# Patient Record
Sex: Female | Born: 1950 | Race: White | Hispanic: No | Marital: Married | Smoking: Never smoker
Health system: Southern US, Community
[De-identification: ages and names within clinical notes are randomized; demographics above are authoritative.]

## PROBLEM LIST (undated history)

## (undated) DIAGNOSIS — K838 Other specified diseases of biliary tract: Secondary | ICD-10-CM

## (undated) DIAGNOSIS — T7840XA Allergy, unspecified, initial encounter: Secondary | ICD-10-CM

## (undated) DIAGNOSIS — H269 Unspecified cataract: Secondary | ICD-10-CM

## (undated) DIAGNOSIS — Z974 Presence of external hearing-aid: Secondary | ICD-10-CM

## (undated) DIAGNOSIS — I10 Essential (primary) hypertension: Secondary | ICD-10-CM

## (undated) DIAGNOSIS — K219 Gastro-esophageal reflux disease without esophagitis: Secondary | ICD-10-CM

## (undated) DIAGNOSIS — E785 Hyperlipidemia, unspecified: Secondary | ICD-10-CM

## (undated) DIAGNOSIS — F419 Anxiety disorder, unspecified: Secondary | ICD-10-CM

## (undated) DIAGNOSIS — F32A Depression, unspecified: Secondary | ICD-10-CM

## (undated) DIAGNOSIS — M1990 Unspecified osteoarthritis, unspecified site: Secondary | ICD-10-CM

## (undated) DIAGNOSIS — N189 Chronic kidney disease, unspecified: Secondary | ICD-10-CM

## (undated) DIAGNOSIS — K9189 Other postprocedural complications and disorders of digestive system: Secondary | ICD-10-CM

## (undated) DIAGNOSIS — G4730 Sleep apnea, unspecified: Secondary | ICD-10-CM

## (undated) DIAGNOSIS — C801 Malignant (primary) neoplasm, unspecified: Secondary | ICD-10-CM

## (undated) DIAGNOSIS — F329 Major depressive disorder, single episode, unspecified: Secondary | ICD-10-CM

## (undated) DIAGNOSIS — D333 Benign neoplasm of cranial nerves: Secondary | ICD-10-CM

## (undated) HISTORY — PX: FOOT SURGERY: SHX648

## (undated) HISTORY — DX: Essential (primary) hypertension: I10

## (undated) HISTORY — DX: Hyperlipidemia, unspecified: E78.5

## (undated) HISTORY — PX: TUBAL LIGATION: SHX77

## (undated) HISTORY — DX: Depression, unspecified: F32.A

## (undated) HISTORY — DX: Other specified diseases of biliary tract: K91.89

## (undated) HISTORY — DX: Allergy, unspecified, initial encounter: T78.40XA

## (undated) HISTORY — PX: COLONOSCOPY: SHX174

## (undated) HISTORY — DX: Unspecified cataract: H26.9

## (undated) HISTORY — DX: Other specified diseases of biliary tract: K83.8

## (undated) HISTORY — PX: BACK SURGERY: SHX140

## (undated) HISTORY — PX: ANKLE FRACTURE SURGERY: SHX122

## (undated) HISTORY — DX: Major depressive disorder, single episode, unspecified: F32.9

## (undated) HISTORY — DX: Malignant (primary) neoplasm, unspecified: C80.1

## (undated) HISTORY — PX: OTHER SURGICAL HISTORY: SHX169

---

## 1968-05-31 HISTORY — PX: BREAST CYST EXCISION: SHX579

## 1998-05-31 HISTORY — PX: MELANOMA EXCISION: SHX5266

## 2004-07-02 ENCOUNTER — Ambulatory Visit: Payer: Self-pay | Admitting: Internal Medicine

## 2004-11-04 ENCOUNTER — Ambulatory Visit: Payer: Self-pay | Admitting: Internal Medicine

## 2004-12-15 ENCOUNTER — Encounter: Payer: Self-pay | Admitting: Otolaryngology

## 2005-09-21 ENCOUNTER — Ambulatory Visit: Payer: Self-pay | Admitting: Internal Medicine

## 2006-06-22 ENCOUNTER — Ambulatory Visit: Payer: Self-pay | Admitting: Gastroenterology

## 2006-09-26 ENCOUNTER — Ambulatory Visit: Payer: Self-pay | Admitting: Internal Medicine

## 2007-01-02 ENCOUNTER — Ambulatory Visit: Payer: Self-pay | Admitting: Internal Medicine

## 2007-06-09 ENCOUNTER — Ambulatory Visit: Payer: Self-pay | Admitting: Internal Medicine

## 2007-08-23 ENCOUNTER — Inpatient Hospital Stay: Payer: Self-pay | Admitting: Vascular Surgery

## 2008-05-31 HISTORY — PX: APPENDECTOMY: SHX54

## 2008-10-15 ENCOUNTER — Ambulatory Visit: Payer: Self-pay | Admitting: Internal Medicine

## 2008-11-26 ENCOUNTER — Ambulatory Visit: Payer: Self-pay | Admitting: Internal Medicine

## 2009-05-19 ENCOUNTER — Ambulatory Visit: Payer: Self-pay | Admitting: Internal Medicine

## 2009-08-12 ENCOUNTER — Encounter: Payer: Self-pay | Admitting: Internal Medicine

## 2009-08-21 ENCOUNTER — Ambulatory Visit: Payer: Self-pay | Admitting: Internal Medicine

## 2009-08-29 ENCOUNTER — Encounter: Payer: Self-pay | Admitting: Internal Medicine

## 2009-09-28 ENCOUNTER — Encounter: Payer: Self-pay | Admitting: Internal Medicine

## 2009-10-29 ENCOUNTER — Ambulatory Visit (HOSPITAL_COMMUNITY): Admission: RE | Admit: 2009-10-29 | Discharge: 2009-10-30 | Payer: Self-pay | Admitting: Neurosurgery

## 2010-02-24 ENCOUNTER — Encounter: Payer: Self-pay | Admitting: Internal Medicine

## 2010-03-30 ENCOUNTER — Ambulatory Visit: Payer: Self-pay | Admitting: Internal Medicine

## 2010-04-10 ENCOUNTER — Ambulatory Visit: Payer: Self-pay | Admitting: Internal Medicine

## 2010-04-10 DIAGNOSIS — R05 Cough: Secondary | ICD-10-CM | POA: Insufficient documentation

## 2010-04-10 DIAGNOSIS — I10 Essential (primary) hypertension: Secondary | ICD-10-CM | POA: Insufficient documentation

## 2010-04-10 DIAGNOSIS — E785 Hyperlipidemia, unspecified: Secondary | ICD-10-CM | POA: Insufficient documentation

## 2010-04-10 DIAGNOSIS — M8580 Other specified disorders of bone density and structure, unspecified site: Secondary | ICD-10-CM | POA: Insufficient documentation

## 2010-04-10 DIAGNOSIS — M810 Age-related osteoporosis without current pathological fracture: Secondary | ICD-10-CM | POA: Insufficient documentation

## 2010-04-10 DIAGNOSIS — R059 Cough, unspecified: Secondary | ICD-10-CM | POA: Insufficient documentation

## 2010-04-10 HISTORY — DX: Essential (primary) hypertension: I10

## 2010-04-10 HISTORY — DX: Age-related osteoporosis without current pathological fracture: M81.0

## 2010-04-16 ENCOUNTER — Ambulatory Visit: Payer: Self-pay | Admitting: Internal Medicine

## 2010-05-06 ENCOUNTER — Encounter: Payer: Self-pay | Admitting: Internal Medicine

## 2010-05-13 ENCOUNTER — Ambulatory Visit: Payer: Self-pay | Admitting: Internal Medicine

## 2010-05-15 ENCOUNTER — Telehealth (INDEPENDENT_AMBULATORY_CARE_PROVIDER_SITE_OTHER): Payer: Self-pay | Admitting: *Deleted

## 2010-05-26 ENCOUNTER — Ambulatory Visit (HOSPITAL_COMMUNITY)
Admission: RE | Admit: 2010-05-26 | Discharge: 2010-05-26 | Payer: Self-pay | Source: Home / Self Care | Attending: Internal Medicine | Admitting: Internal Medicine

## 2010-06-02 ENCOUNTER — Encounter: Payer: Self-pay | Admitting: Unknown Physician Specialty

## 2010-06-16 ENCOUNTER — Telehealth: Payer: Self-pay | Admitting: Internal Medicine

## 2010-06-30 NOTE — Letter (Signed)
Summary: [facility] Practices   Imported By: Sherian Rein 04/15/2010 14:02:31  _____________________________________________________________________  External Attachment:    Type:   Image     Comment:   External Document

## 2010-06-30 NOTE — Assessment & Plan Note (Signed)
Summary: cough, unresponsive to PPIs///JJ   Visit Type:  Initial Consult Copy to:  Dr Darrick Huntsman  Primary Provider/Referring Provider:  Dr. Duncan Dull, [facility]  CC:  Pulmonary consult for chronic dry cough and phlegm in throat. Marland Kitchen  History of Present Illness: April 10, 2010: REferred by Dr. Darrick Huntsman. 59 year pleasant lady. Limited remote smoker.  STates for 3 years has had persistent, chronic post nasal drainage that results in cough periodically. Cough is intermittent and only few times a week. Occassionally episodes are "violent" and in fall/winter sinus drainage tends to be thick. She feels cough episodes are a by product of the post nasal drainage. She feels there is phlegm in throat al lthe time. Cough is worse in fall and winter and will have green sputum bronchiis few times. Cough improved by water. Cough meds have not helped. 6  week nasal inhalers did not help earlier this year.  Symptoms are associated with sensation of constantly hvaing to clear throat. Denies wheeze, edema, hemoptysis, fever, weight loss, chills.     Seen by a an ENT 3 years ago Thought it was due to lopressor. 30 day trial off the drug did not resolve cough and so back on lopressor. Was advised zyrtec and claritin but without relief. A year ago PMD then considered GERD  and started omeoprazole which she has been taking for a year without relief. Summer 2011 saw Dr. Willeen Cass another ENT specialist. Had RAST alergy test that was normal. CT scan sinuses - normal per history. REviewed with PMD again and referred to pulmonary.    Preventive Screening-Counseling & Management  Alcohol-Tobacco     Smoking Status: quit > 6 months     Packs/Day: <0.25     Year Started: 1969     Year Quit: 1975  Current Medications (verified): 1)  Omeprazole 40 Mg Cpdr (Omeprazole) .... Take 1 Tablet By Mouth Once A Day 2)  Pravastatin Sodium 20 Mg Tabs (Pravastatin Sodium) .... Take 1 Tablet By Mouth Once A Day 3)   Meloxicam 15 Mg Tabs (Meloxicam) .... Take 1 Tablet By Mouth Once A Day 4)  Triamterene-Hctz 37.5-25 Mg Tabs (Triamterene-Hctz) .... Take 1 Tablet By Mouth Once A Day 5)  Celexa 20 Mg Tabs (Citalopram Hydrobromide) .... Take 1 Tablet By Mouth Once A Day 6)  Alprazolam 0.25 Mg Tabs (Alprazolam) .... Take 1/2 To 1 Daily As Needed 7)  Metoprolol Succinate 50 Mg Xr24h-Tab (Metoprolol Succinate) .... Take 1 Tablet By Mouth Once A Day 8)  Nizoral 2 % Sham (Ketoconazole) .... Shampoo As Needed 9)  Caltrate 600+d Plus 600-400 Mg-Unit Tabs (Calcium Carbonate-Vit D-Min) .... Take 1 Tablet By Mouth Once A Day 10)  Multivitamins  Tabs (Multiple Vitamin) .... Take 1 Tablet By Mouth Once A Day 11)  Omega-3 1000 Mg Caps (Omega-3 Fatty Acids) .... 2 Tablets Daily 12)  Vitamin D3 1000 Unit Tabs (Cholecalciferol) .... Take 1 Tablet By Mouth Once A Day 13)  Sudafed 30 Mg Tabs (Pseudoephedrine Hcl) .... As Needed 14)  Tylenol Extra Strength 500 Mg Tabs (Acetaminophen) .... As Needed 15)  Fibercon 625 Mg Tabs (Calcium Polycarbophil) .... As Needed 16)  Hydrocodone-Acetaminophen 7.5-750 Mg Tabs (Hydrocodone-Acetaminophen) .... As Needed  Allergies (verified): No Known Drug Allergies  Past History:  Past Medical History: Hypertension Hypercholesterolemia Melanoma Osteoporosis  Procedures:  Carotid Doppler 6-06-negative Negative Treadmill with Myoview May 2003 Cardiovascular Stress Test-2003, normal per pt Echo Trace Tricuspid Regur 4-03 Hx of renal insuff  - Creat 1.04  per patient. 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   Occ glass  of wine Smoking Status:  quit > 6 months Packs/Day:  <0.25  Review of Systems       The patient complains of non-productive cough and joint stiffness or pain.  The patient denies shortness of breath with activity, shortness of breath at rest, productive cough, coughing up blood, chest pain, irregular heartbeats, acid heartburn, indigestion, loss of appetite, weight change, abdominal pain, difficulty swallowing, sore throat, tooth/dental problems, headaches, nasal congestion/difficulty breathing through nose, sneezing, itching, ear ache, anxiety, depression, hand/feet swelling, rash, change in color of mucus, and fever.    Vital Signs:  Patient profile:   60 year old female Height:      62 inches Weight:      156.13 pounds BMI:     28.66 O2 Sat:      98 % on Room air Temp:     97.8 degrees F oral Pulse rate:   52 / minute BP sitting:   130 / 88  (right arm) Cuff size:   regular  Vitals Entered By: Carron Curie CMA (April 10, 2010 1:35 PM)  O2 Flow:  Room air CC: Pulmonary consult for chronic dry cough and phlegm in throat.  Comments Medications reviewed with patient Carron Curie CMA  April 10, 2010 1:36 PM Daytime phone number verified with patient.    Physical Exam  General:  well developed, well nourished, in no acute distress Head:  normocephalic and atraumatic Eyes:  PERRLA/EOM intact; conjunctiva and sclera clear Ears:  TMs intact and clear with normal canals Nose:  no deformity, discharge, inflammation, or lesions Mouth:  no deformity or lesions Neck:  no masses, thyromegaly, or abnormal cervical nodes Chest Wall:  no deformities noted Lungs:  clear bilaterally to auscultation and percussion Heart:  regular rate and rhythm, S1, S2 without murmurs, rubs, gallops, or clicks Abdomen:  bowel sounds positive; abdomen soft and non-tender without masses, or organomegaly Msk:  no deformity or scoliosis noted with normal posture Pulses:  pulses  normal Extremities:  no clubbing, cyanosis, edema, or deformity noted Neurologic:  CN II-XII grossly intact with normal reflexes, coordination, muscle strength and tone Skin:  intact without lesions or rashes Cervical Nodes:  no significant adenopathy Axillary Nodes:  no significant adenopathy Psych:  alert and cooperative; normal mood and affect; normal attention span and concentration   Impression & Recommendations:  Problem # 1:  COUGH (ICD-786.2) Assessment New  Probably from chronic sinus drainage and +/- habit cough  plan start netti pot saline wash daily full pft - > depending ion results CT or methacholine challenge if they are negative, try speech therapy and neurontin for cyclical cough  Orders: Consultation Level IV (16109)  Medications Added to Medication List This Visit: 1)  Sudafed 30 Mg Tabs (Pseudoephedrine hcl) .... As needed 2)  Tylenol Extra Strength 500 Mg Tabs (Acetaminophen) .... As needed 3)  Fibercon 625 Mg Tabs (Calcium polycarbophil) .... As needed 4)  Hydrocodone-acetaminophen 7.5-750 Mg Tabs (Hydrocodone-acetaminophen) .... As needed  Patient Instructions: 1)  do netti pot saline wash daily 2)  have full  PFT here or [place] 3)   - call us once done so I can review and advise next step or medicine over phone 4)  have flu shot today

## 2010-06-30 NOTE — Miscellaneous (Signed)
Summary: Orders Update pft charges  Clinical Lists Changes  Orders: Added new Service order of Carbon Monoxide diffusing w/capacity (94720) - Signed Added new Service order of Lung Volumes (94240) - Signed Added new Service order of Spirometry (Pre & Post) (94060) - Signed 

## 2010-07-01 ENCOUNTER — Encounter: Payer: Self-pay | Admitting: Unknown Physician Specialty

## 2010-07-02 NOTE — Progress Notes (Signed)
Summary: reqeusting results today  Phone Note Call from Patient Call back at Home Phone [phone]   Caller: Patient Call For: DR. Marchelle Gearing Summary of Call: Patient phoned she has an appointment with her PCP Dr. Darrick Huntsman on Tuesday 06/19/09 and would like the results of her breathing test sent to Dr. Darrick Huntsman so they could discuss the results at this appointment. Patient would also like the results id she can be called today at [phone] becasue patient will be in [place] on Monday. If she can't get the results today just please send them to Dr. Darrick Huntsman.  Patient can be reached at [phone] Initial call taken by: Vedia Coffer,  May 15, 2010 5:06 PM  Follow-up for Phone Call        Called, spoke with pt.  She is requesting PFT results from 04/16/10.  These results are in EMR.  Also requesting results be sent to Dr. Darrick Huntsman, her PCP.  MR, pls advise of results.  Thanks!  ** PFT results faxed to Dr. Darrick Huntsman Follow-up by: Gweneth Dimitri RN,  May 15, 2010 5:14 PM  Additional Follow-up for Phone Call Additional follow up Details #1::        Gave results of PFT to patient. IT is normal. Therefore, no COPD but asthma not ruled out. I have ordered methacholine challenge. Please schedule this (order done) and rov after methacholine challenge Additional Follow-up by: Kalman Shan MD,  May 15, 2010 5:25 PM    Additional Follow-up for Phone Call Additional follow up Details #2::    Will forward message to [facility] to schedule methacholine challenge.  ROV with MR needed after test. Gweneth Dimitri RN  May 15, 2010 5:27 PM methacholine challege test has been scheduled; left msg for pt to call to give info .Kandice Hams [facility]  May 18, 2010 9:57 AM  Follow-up by: Kandice Hams CMA,  May 18, 2010 9:57 AM  Additional Follow-up for Phone Call Additional follow up Details #3:: Details for Additional Follow-up Action Taken: pt called back and is aware of this appt for methacholine  challenge @[facility]  05/26/10@10 :00am  Additional Follow-up by: Oneita Jolly,  May 19, 2010 11:13 AM

## 2010-07-02 NOTE — Progress Notes (Signed)
Summary: test results and where to go from here  Phone Note Call from Patient   Caller: Patient Call For: DR [facility] Summary of Call: Patient phoned stated that she had the challange test in December after Christmas and she has not heard from Dr. Marchelle Gearing on these resutls. Also stated that Dr. Marchelle Gearing told her that they needed to rule out Asthma first and once they ruled this out there was a medicine that they could try. She also went to a ENT Dr. 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 He also told her to take zyrtec. She is using the nose drops and zyrtec and still has the mucas. Dr. Jenne [facility] also doubled her reflux meds.Patient can be reached at [phone] she has speech therapy from 3:30 to 4:30. Patient would like a call back to see where she goes from here.    Initial call taken by: Vedia Coffer,  June 16, 2010 2:36 PM  Follow-up for Phone Call        called and spoke with pt.  pt states she had [facility] test done 05/26/2010 and is waiting on the results of this.    Also, pt states her daughter is a speech therapist and recommended pt see an ENT.  Therefore, pt went to see Dr. 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  pt c/o having a lot of mucus in her throat and having to clear her throat alot which has caused hoarseness.  Pt states she is following Dr. Loraine Grip' recs:  Taking Omeprazole 40mg  two times a day, Zyrtec once daily, and Ipratropium nose drops 2 sprays two times a day and has gone to speech therapy 4 x.   Pt states MR had mentioned putting pt on a med to help with the mucus and pt would like to discuss this.  will forward message to MR to address. Aundra Millet Reynolds LPN  June 16, 2010 3:09 PM   Additional Follow-up for Phone Call Additional follow up Details #1::        Methacholine challenge test neative 05/06/2010. Please note that they had not sent [place] the report  and we had to request it be faxed.  REC 1. Is she doing netti pot ? 2. My plan was if methacholine negativge was to have her do neurontin and speech therapy. It looks like she is already doing speech therapy. Has she started neurontin ?  I called her at home and LMTCB. Called her at other number and no return. Please offer her retrun to see me to discuss in detail face to face. If she wont take it then we can do neurontin script  Just let  me know Additional Follow-up by: Kalman Shan MD,  June 16, 2010 6:46 PM    Additional Follow-up for Phone Call Additional follow up Details #2::    Called pt's home number at [phone]  [facility]  MR has openings this am -- ? can she come in today to discuss this? Gweneth Dimitri RN  June 17, 2010 9:44 AM   Returning call. [phone] [facility]  June 17, 2010 3:40 PM  Additional Follow-up for Phone Call Additional follow up Details #3:: Details for Additional Follow-up Action Taken: I called number above. LMTCB. Please schedule appt for her for face to face discussion Additional Follow-up by: Kalman Shan MD,  June 17, 2010 6:03 PM  Spoke  with pt and advised MR recs and appt to discuss. She states she wants to try a couple more weeks of speech therapy and then call back and set an appt if needed. Carron Curie CMA  June 18, 2010 10:26 AM

## 2010-08-17 LAB — BASIC METABOLIC PANEL
BUN: 41 mg/dL — ABNORMAL HIGH (ref 6–23)
CO2: 27 mEq/L (ref 19–32)
Calcium: 9.3 mg/dL (ref 8.4–10.5)
Chloride: 112 mEq/L (ref 96–112)
Creatinine, Ser: 1.5 mg/dL — ABNORMAL HIGH (ref 0.4–1.2)
GFR calc Af Amer: 43 mL/min — ABNORMAL LOW (ref >60)
GFR calc non Af Amer: 35 mL/min — ABNORMAL LOW (ref >60)
Glucose, Bld: 90 mg/dL (ref 70–99)
Potassium: 5.2 mEq/L — ABNORMAL HIGH (ref 3.5–5.1)
Sodium: 144 mEq/L (ref 135–145)

## 2010-08-17 LAB — URINALYSIS, ROUTINE W REFLEX MICROSCOPIC
Glucose, UA: NEGATIVE mg/dL
Hgb urine dipstick: NEGATIVE
Ketones, ur: NEGATIVE mg/dL
Nitrite: NEGATIVE
Protein, ur: NEGATIVE mg/dL
Specific Gravity, Urine: 1.029 (ref 1.005–1.030)
Urobilinogen, UA: 0.2 mg/dL (ref 0.0–1.0)
pH: 5.5 (ref 5.0–8.0)

## 2010-08-17 LAB — APTT: aPTT: 27 seconds (ref 24–37)

## 2010-08-17 LAB — CBC
HCT: 35.5 % — ABNORMAL LOW (ref 36.0–46.0)
Hemoglobin: 12.5 g/dL (ref 12.0–15.0)
MCHC: 35.3 g/dL (ref 30.0–36.0)
MCV: 96.2 fL (ref 78.0–100.0)
Platelets: 211 10*3/uL (ref 150–400)
RBC: 3.69 MIL/uL — ABNORMAL LOW (ref 3.87–5.11)
RDW: 13.4 % (ref 11.5–15.5)
WBC: 9.3 10*3/uL (ref 4.0–10.5)

## 2010-08-17 LAB — SURGICAL PCR SCREEN
MRSA, PCR: NEGATIVE
Staphylococcus aureus: NEGATIVE

## 2010-08-17 LAB — PROTIME-INR
INR: 0.91 (ref 0.00–1.49)
Prothrombin Time: 12.2 seconds (ref 11.6–15.2)

## 2010-11-02 DIAGNOSIS — Z85820 Personal history of malignant melanoma of skin: Secondary | ICD-10-CM | POA: Insufficient documentation

## 2010-11-02 DIAGNOSIS — C439 Malignant melanoma of skin, unspecified: Secondary | ICD-10-CM | POA: Insufficient documentation

## 2010-11-02 HISTORY — DX: Malignant melanoma of skin, unspecified: C43.9

## 2011-01-22 ENCOUNTER — Other Ambulatory Visit: Payer: Self-pay | Admitting: *Deleted

## 2011-01-22 MED ORDER — OMEPRAZOLE 40 MG PO CPDR: 40.0000 mg | DELAYED_RELEASE_CAPSULE | Freq: Two times a day (BID) | ORAL | Status: DC

## 2011-01-27 ENCOUNTER — Other Ambulatory Visit: Payer: Self-pay | Admitting: Internal Medicine

## 2011-01-27 MED ORDER — ATORVASTATIN CALCIUM 20 MG PO TABS: 20.0000 mg | ORAL_TABLET | Freq: Every day | ORAL | Status: DC

## 2011-04-05 DIAGNOSIS — D4819 Other specified neoplasm of uncertain behavior of connective and other soft tissue: Secondary | ICD-10-CM

## 2011-04-05 DIAGNOSIS — D481 Neoplasm of uncertain behavior of connective and other soft tissue: Secondary | ICD-10-CM | POA: Insufficient documentation

## 2011-04-05 HISTORY — DX: Other specified neoplasm of uncertain behavior of connective and other soft tissue: D48.19

## 2011-04-29 ENCOUNTER — Ambulatory Visit: Payer: Self-pay | Admitting: Internal Medicine

## 2011-06-13 ENCOUNTER — Other Ambulatory Visit: Payer: Self-pay | Admitting: Internal Medicine

## 2011-07-28 ENCOUNTER — Other Ambulatory Visit: Payer: Self-pay | Admitting: Internal Medicine

## 2011-07-28 MED ORDER — OMEPRAZOLE 40 MG PO CPDR: 40.0000 mg | DELAYED_RELEASE_CAPSULE | Freq: Two times a day (BID) | ORAL | Status: DC

## 2011-08-25 ENCOUNTER — Ambulatory Visit: Payer: Self-pay | Admitting: Unknown Physician Specialty

## 2011-08-30 ENCOUNTER — Other Ambulatory Visit: Payer: Self-pay | Admitting: Internal Medicine

## 2011-08-30 MED ORDER — ATORVASTATIN CALCIUM 20 MG PO TABS: 20.0000 mg | ORAL_TABLET | Freq: Every day | ORAL | Status: DC

## 2011-08-30 NOTE — Telephone Encounter (Signed)
This patient told me she would be staying with Dr. Alison Murray when I left the practice,  And I have not seen her yet in nearly 8 months, so I cannot keep refilling if she is not going to follow up with me. If she plans to continue with ,e ,  Ok to refill #30 each with 1 refill

## 2011-09-01 NOTE — Telephone Encounter (Signed)
I called patient and she stated she was staying with Dr. Alison Murray, so I did not call in her medication request.  I denied them so the pharmacy would not send anymore.

## 2011-09-01 NOTE — Telephone Encounter (Signed)
I did no authorize refill on the atorvastatin since she has not had labs in over 8 months.  Please cancel authorization if she has not picked up

## 2011-09-30 DIAGNOSIS — D333 Benign neoplasm of cranial nerves: Secondary | ICD-10-CM | POA: Insufficient documentation

## 2011-09-30 HISTORY — DX: Benign neoplasm of cranial nerves: D33.3

## 2011-10-21 DIAGNOSIS — C44320 Squamous cell carcinoma of skin of unspecified parts of face: Secondary | ICD-10-CM

## 2011-10-21 HISTORY — DX: Squamous cell carcinoma of skin of unspecified parts of face: C44.320

## 2011-11-26 ENCOUNTER — Other Ambulatory Visit: Payer: Self-pay | Admitting: Internal Medicine

## 2011-11-30 ENCOUNTER — Other Ambulatory Visit: Payer: Self-pay | Admitting: *Deleted

## 2011-11-30 MED ORDER — OMEPRAZOLE 40 MG PO CPDR: 40.0000 mg | DELAYED_RELEASE_CAPSULE | Freq: Two times a day (BID) | ORAL | Status: DC

## 2011-12-13 ENCOUNTER — Ambulatory Visit: Payer: Self-pay | Admitting: Internal Medicine

## 2012-02-27 ENCOUNTER — Other Ambulatory Visit: Payer: Self-pay | Admitting: Internal Medicine

## 2012-02-28 ENCOUNTER — Other Ambulatory Visit: Payer: Self-pay | Admitting: Internal Medicine

## 2012-02-28 NOTE — Telephone Encounter (Signed)
Patient has not been seen in this office.  Cannot refill

## 2012-06-06 ENCOUNTER — Other Ambulatory Visit: Payer: Self-pay | Admitting: Internal Medicine

## 2012-06-06 NOTE — Telephone Encounter (Signed)
Prior Authorization is needed for this medication # [id number]  omeprazole (PRILOSEC) 40 MG capsule   #60

## 2012-06-09 NOTE — Telephone Encounter (Signed)
Insurance company called waiting on fax.

## 2012-06-12 ENCOUNTER — Telehealth: Payer: Self-pay | Admitting: *Deleted

## 2012-06-12 NOTE — Telephone Encounter (Signed)
Called [phone] for prior authorization spoke with Marylu Lund form is faxed over, case number [id number]

## 2012-06-13 ENCOUNTER — Telehealth: Payer: Self-pay | Admitting: *Deleted

## 2012-06-13 NOTE — Telephone Encounter (Signed)
Called [phone] spoke to sara for prior authorization request form was received and given to [place]

## 2012-09-04 ENCOUNTER — Other Ambulatory Visit: Payer: Self-pay | Admitting: Internal Medicine

## 2012-09-04 NOTE — Telephone Encounter (Signed)
No record of pt being seen. Please advise.

## 2012-09-06 DIAGNOSIS — R944 Abnormal results of kidney function studies: Secondary | ICD-10-CM | POA: Insufficient documentation

## 2012-10-11 ENCOUNTER — Ambulatory Visit: Payer: Self-pay | Admitting: Internal Medicine

## 2012-10-12 ENCOUNTER — Ambulatory Visit: Payer: Self-pay | Admitting: Internal Medicine

## 2012-12-11 ENCOUNTER — Ambulatory Visit: Payer: Self-pay | Admitting: Podiatry

## 2013-11-19 DIAGNOSIS — F411 Generalized anxiety disorder: Secondary | ICD-10-CM

## 2013-11-19 DIAGNOSIS — K219 Gastro-esophageal reflux disease without esophagitis: Secondary | ICD-10-CM | POA: Insufficient documentation

## 2013-11-19 HISTORY — DX: Generalized anxiety disorder: F41.1

## 2013-11-19 HISTORY — DX: Gastro-esophageal reflux disease without esophagitis: K21.9

## 2014-01-28 LAB — CBC AND DIFFERENTIAL
HCT: 41 % (ref 36–46)
Hemoglobin: 13.7 g/dL (ref 12.0–16.0)
Platelets: 218 10*3/uL (ref 150–399)
WBC: 5.7 10^3/mL

## 2014-01-28 LAB — BASIC METABOLIC PANEL
BUN: 22 mg/dL — AB (ref 4–21)
Creatinine: 1.1 mg/dL (ref 0.5–1.1)
Glucose: 84 mg/dL
Potassium: 5.1 mmol/L (ref 3.4–5.3)
Sodium: 140 mmol/L (ref 137–147)

## 2014-01-28 LAB — LIPID PANEL
Cholesterol: 158 mg/dL (ref 0–200)
HDL: 44 mg/dL (ref 35–70)
LDL Cholesterol: 97 mg/dL
Triglycerides: 83 mg/dL (ref 40–160)

## 2014-03-29 ENCOUNTER — Ambulatory Visit: Payer: Self-pay | Admitting: Family Medicine

## 2014-03-29 LAB — HM MAMMOGRAPHY

## 2014-09-11 ENCOUNTER — Ambulatory Visit: Payer: Self-pay | Admitting: Internal Medicine

## 2014-09-11 ENCOUNTER — Telehealth: Payer: Self-pay | Admitting: *Deleted

## 2014-09-11 NOTE — Telephone Encounter (Signed)
If patient left a VM last week to change her appt ,   she should not be charged  A no show fee.  Whoever did not handle the message last week when you were gone is responsible for me now having no patient at 2pm

## 2014-09-11 NOTE — Telephone Encounter (Signed)
pt had a new pt appt at 2:00 today. Pt called last week left VM to RS her appt for today. pt has a new appt on 09/25/14 as a new pt. Please advise MD.

## 2014-09-11 NOTE — Telephone Encounter (Signed)
Was able to feel appointment slot.

## 2014-09-25 ENCOUNTER — Telehealth: Payer: Self-pay | Admitting: *Deleted

## 2014-09-25 ENCOUNTER — Encounter (INDEPENDENT_AMBULATORY_CARE_PROVIDER_SITE_OTHER): Payer: Self-pay

## 2014-09-25 ENCOUNTER — Ambulatory Visit (INDEPENDENT_AMBULATORY_CARE_PROVIDER_SITE_OTHER): Payer: 59 | Admitting: Internal Medicine

## 2014-09-25 ENCOUNTER — Encounter: Payer: Self-pay | Admitting: Internal Medicine

## 2014-09-25 VITALS — BP 126/88 | HR 50 | Temp 99.0°F | Resp 14 | Ht 63.0 in | Wt 177.5 lb

## 2014-09-25 DIAGNOSIS — R5383 Other fatigue: Secondary | ICD-10-CM

## 2014-09-25 DIAGNOSIS — R140 Abdominal distension (gaseous): Secondary | ICD-10-CM

## 2014-09-25 DIAGNOSIS — E785 Hyperlipidemia, unspecified: Secondary | ICD-10-CM

## 2014-09-25 DIAGNOSIS — I10 Essential (primary) hypertension: Secondary | ICD-10-CM

## 2014-09-25 DIAGNOSIS — R1032 Left lower quadrant pain: Secondary | ICD-10-CM | POA: Diagnosis not present

## 2014-09-25 DIAGNOSIS — E669 Obesity, unspecified: Secondary | ICD-10-CM

## 2014-09-25 DIAGNOSIS — M7552 Bursitis of left shoulder: Secondary | ICD-10-CM

## 2014-09-25 DIAGNOSIS — K432 Incisional hernia without obstruction or gangrene: Secondary | ICD-10-CM

## 2014-09-25 LAB — CBC WITH DIFFERENTIAL/PLATELET
Basophils Absolute: 0.1 10*3/uL (ref 0.0–0.1)
Basophils Relative: 0.9 % (ref 0.0–3.0)
Eosinophils Absolute: 0.3 10*3/uL (ref 0.0–0.7)
Eosinophils Relative: 4.7 % (ref 0.0–5.0)
HCT: 43.4 % (ref 36.0–46.0)
Hemoglobin: 14.6 g/dL (ref 12.0–15.0)
Lymphocytes Relative: 26.2 % (ref 12.0–46.0)
Lymphs Abs: 1.5 10*3/uL (ref 0.7–4.0)
MCHC: 33.5 g/dL (ref 30.0–36.0)
MCV: 91.6 fl (ref 78.0–100.0)
Monocytes Absolute: 0.6 10*3/uL (ref 0.1–1.0)
Monocytes Relative: 9.5 % (ref 3.0–12.0)
Neutro Abs: 3.5 10*3/uL (ref 1.4–7.7)
Neutrophils Relative %: 58.7 % (ref 43.0–77.0)
Platelets: 229 10*3/uL (ref 150.0–400.0)
RBC: 4.74 Mil/uL (ref 3.87–5.11)
RDW: 13.5 % (ref 11.5–15.5)
WBC: 5.9 10*3/uL (ref 4.0–10.5)

## 2014-09-25 LAB — COMPREHENSIVE METABOLIC PANEL
ALT: 25 U/L (ref 0–35)
AST: 23 U/L (ref 0–37)
Albumin: 4.6 g/dL (ref 3.5–5.2)
Alkaline Phosphatase: 98 U/L (ref 39–117)
BUN: 23 mg/dL (ref 6–23)
CO2: 31 mEq/L (ref 19–32)
Calcium: 10 mg/dL (ref 8.4–10.5)
Chloride: 105 mEq/L (ref 96–112)
Creatinine, Ser: 1.14 mg/dL (ref 0.40–1.20)
GFR: 50.65 mL/min — ABNORMAL LOW (ref >60.00)
Glucose, Bld: 83 mg/dL (ref 70–99)
Potassium: 4.9 mEq/L (ref 3.5–5.1)
Sodium: 141 mEq/L (ref 135–145)
Total Bilirubin: 1.5 mg/dL — ABNORMAL HIGH (ref 0.2–1.2)
Total Protein: 7 g/dL (ref 6.0–8.3)

## 2014-09-25 LAB — LIPID PANEL
Cholesterol: 161 mg/dL (ref 0–200)
HDL: 52.8 mg/dL (ref >39.00)
LDL Cholesterol: 90 mg/dL (ref 0–99)
NonHDL: 108.2
Total CHOL/HDL Ratio: 3
Triglycerides: 91 mg/dL (ref 0.0–149.0)
VLDL: 18.2 mg/dL (ref 0.0–40.0)

## 2014-09-25 LAB — TSH: TSH: 1.5 u[IU]/mL (ref 0.35–4.50)

## 2014-09-25 MED ORDER — DICLOFENAC SODIUM 1 % TD GEL: 2.0000 g | Freq: Four times a day (QID) | TRANSDERMAL | Status: DC

## 2014-09-25 NOTE — Telephone Encounter (Signed)
Fax from pharmacy, needing PA for Diclofenac gel. Started online, given to Dr. Derrel Nip for completion.

## 2014-09-25 NOTE — Progress Notes (Signed)
Pre-visit discussion using our clinic review tool. No additional management support is needed unless otherwise documented below in the visit note.  

## 2014-09-25 NOTE — Progress Notes (Signed)
Patient ID: Kathy Long, female   DOB: Oct 30, 1950, 64 y.o.   MRN: [id number]  Patient Active Problem List   Diagnosis Date Noted  . Ventral incisional hernia 09/28/2014  . Obesity 09/28/2014  . Subacromial bursitis 09/26/2014  . Hyperlipidemia LDL goal <130 04/10/2010  . Essential hypertension 04/10/2010  . OSTEOPOROSIS 04/10/2010    Subjective:  CC:   Chief Complaint  Patient presents with  . Establish Care    Patient thinks she may have a hypergatric hernia    HPI:   Kathy Long a 64 y.o. female who presents with multiple issues to discuss:   1) Painless mid abdominal swelling .  She has a history of a laparoscopic appy in 2009  And has noticed a reducible lump in her mid abdomen that is painless,  Has been present for the past year. Also notes increased abdominal girth, and .  Periodic pain in the right lower quadrant and right mid section which is sometimes aggravated by eating.   2) Insomnia.  Uses melatonin to help her sleep.    Worries keep her awake.  Has been taking citalopram 20 mg for history of depression.  Husband's early retirement has  Created some stress for her.   3) left shoulder pain top of deltoid aggravated with abduction and raising of airm.   has tried glucosamine,  Told to avoid NSAIDs because of mild renal insufficiency   4) weight gain of 30 lbs over the past 5 years.  Travels 4-5 days per month in state for her job.   requiring stays away from home..  Does not exercise regularly    Past Medical History  Diagnosis Date  . Cancer   . Depression   . Hypertension   . Hyperlipidemia     Allergies  Allergen Reactions  . Tramadol Other (See Comments)  . Tape Rash    Other reaction(s): UNKNOWN     Past Surgical History  Procedure Laterality Date  . Melanoma excision Left 2000  . Appendectomy      History   Social History  . Marital Status: Married    Spouse Name: N/A  . Number of Children: N/A  . Years of Education: 73 , PhD    Occupational History  . consultant    Social History Main Topics  . Smoking status: Never Smoker   . Smokeless tobacco: Not on file  . Alcohol Use: Not on file  . Drug Use: Not on file  . Sexual Activity: Not on file   Other Topics Concern  . Not on file   Social History Narrative   Family History  Problem Relation Age of Onset  . Cancer Sister 91    ovarian ca    Review of Systems:  Patient denies headache, fevers, malaise, unintentional weight loss, skin rash, eye pain, sinus congestion and sinus pain, sore throat, dysphagia,  hemoptysis , cough, dyspnea, wheezing, chest pain, palpitations, orthopnea, edema, abdominal pain, nausea, melena, diarrhea, constipation, flank pain, dysuria, hematuria, urinary  Frequency, nocturia, numbness, tingling, seizures,  Focal weakness, Loss of consciousness,  Tremor, insomnia, depression, anxiety, and suicidal ideation.      Objective:  BP 126/88 mmHg  Pulse 50  Temp(Src) 99 F (37.2 C) (Oral)  Resp 14  Ht 5\' 3"  (1.6 m)  Wt 177 lb 8 oz (80.513 kg)  BMI 31.45 kg/m2  SpO2 96%  General appearance: alert, cooperative and appears stated age Ears: normal TM's and external ear canals both ears Throat: lips,  mucosa, and tongue normal; teeth and gums normal Neck: no adenopathy, no carotid bruit, supple, symmetrical, trachea midline and thyroid not enlarged, symmetric, no tenderness/mass/nodules Back: symmetric, no curvature. ROM normal. No CVA tenderness. Lungs: clear to auscultation bilaterally Heart: regular rate and rhythm, S1, S2 normal, no murmur, click, rub or gallop Abdomen: soft, non-tender; bowel sounds normal; no masses,  no organomegaly Pulses: 2+ and symmetric Skin: Skin color, texture, turgor normal. No rashes or lesions Lymph nodes: Cervical, supraclavicular, and axillary nodes normal.  Assessment and Plan:  Subacromial bursitis Will check GFR today and recommend topical NSAIDS if covered by insurance  Will consider  plains of shoulder to rule out osteophyte formation at top of humerus as cause.  AUrged to consider ports medicine  Vs steroid injection    Hyperlipidemia LDL goal <130 LDL and triglycerides are at goal on current medications. She has no side effects and liver enzymes are normal. No changes today   Lab Results  Component Value Date   CHOL 161 09/25/2014   HDL 52.80 09/25/2014   LDLCALC 90 09/25/2014   TRIG 91.0 09/25/2014   CHOLHDL 3 09/25/2014   Lab Results  Component Value Date   ALT 25 09/25/2014   AST 23 09/25/2014   ALKPHOS 98 09/25/2014   BILITOT 1.5* 09/25/2014     Essential hypertension Well controlled on current regimen. Renal function stable, no changes today.  Lab Results  Component Value Date   CREATININE 1.14 09/25/2014   Lab Results  Component Value Date   NA 141 09/25/2014   K 4.9 09/25/2014   CL 105 09/25/2014   CO2 31 09/25/2014      Ventral incisional hernia Secondary to lap appy 2009.  Give concurrent recurrent lower abdominal pain and distension ,  FH of ovarian, CA in sister,  Need to rule out intrabdominal mass.  Ct abd and pelvic CT ordered.    Obesity I have addressed  BMI and recommended wt loss of 10% of body weight over the next 6 months using a low glycemic index diet and regular exercise a minimum of 5 days per week.      Updated Medication List Outpatient Encounter Prescriptions as of 09/25/2014  Medication Sig  . amLODipine (NORVASC) 5 MG tablet Take 5 mg by mouth daily.  Marland Kitchen amLODipine (NORVASC) 5 MG tablet Take 5 mg by mouth daily.  Marland Kitchen atorvastatin (LIPITOR) 20 MG tablet Take 1 tablet (20 mg total) by mouth daily.  Marland Kitchen atorvastatin (LIPITOR) 20 MG tablet Take 20 mg by mouth daily.  . Cholecalciferol 1000 UNITS tablet Take 1,000 Units by mouth 2 (two) times daily.  . citalopram (CELEXA) 20 MG tablet Take 20 mg by mouth daily.  . Melatonin 3 MG CAPS Take 3 mg by mouth at bedtime.  . metoprolol succinate (TOPROL-XL) 50 MG 24 hr  tablet TAKE 1 TABLET EVERY DAY  . Omega-3 Krill Oil 300 MG CAPS Take 300 mg by mouth daily.  Marland Kitchen omeprazole (PRILOSEC) 40 MG capsule Take 40 mg by mouth daily.  . diclofenac sodium (VOLTAREN) 1 % GEL Apply 2 g topically 4 (four) times daily.  Marland Kitchen omeprazole (PRILOSEC) 40 MG capsule Take 1 capsule (40 mg total) by mouth 2 (two) times daily.     Orders Placed This Encounter  Procedures  . CT Abdomen Pelvis W Contrast  . CBC with Differential/Platelet  . Comprehensive metabolic panel  . TSH  . Lipid panel  . CA 125    Return in about 3 months (  around 12/25/2014).

## 2014-09-25 NOTE — Patient Instructions (Signed)
It was great to see you again!   Welcome back!!  I am ordering a CT scan of your abd and pelvis to investigate the bloating and pain issues.  We will call you with the appointment  This is my  "Low GI"  Weight loss Diet.  It is appropriate for all patients with normal renal function  (Cr < 1.8)  , gluten tolerance, and advised for patients who have prediabetes or diabetes:   All of the foods can be found at grocery stores and in bulk at [employer].  The Atkins protein bars and shakes are available in more varieties at Target, WalMart and [place].     7 AM Breakfast:  Choose from the following:  < 5 carbs  Weekdays: Low carbohydrate Protein  Shakes (EAS AdvantEdge "Carb  Control" shakes, Atkins,  Muscle Milk or Premier Protein shakes)     Weekends:  a scrambled egg/bacon/cheese burrito made with Mission's "carb  balance" whole wheat tortilla  (about 10 net carbs )  Eggs,  bacon /sausage , Joseph's pita /lavash bread or  (5 carbs)  A slice of fritatta ( egg based baked dish, no  crust:  google it) (< 10 carbs)   Avoid cereal and bananas, oatmeal and cream of wheat and grits. They are loaded with carbohydrates!   10 AM: high protein snack  (< 5 carbs)   Protein bar by Atkins  Or KIND  (the snack size, < 200 cal, usually < 6 carbs    A stick of cheese:  Around 1 carb,  100 cal      Other so called "protein bars" tend to be loaded with carbohydrates.  Remember, in food advertising, the word "energy" is synonymous for " carbohydrate."  Lunch:   A Sandwich using the bread choices listed, Can use any  Eggs,  lunchmeat, grilled meat or canned tuna).  Can add avocado, regular mayo/mustard  and cheese.  A Salad using blue cheese, ranch,  Goddess dressing  or vinagrette,  No croutons or "confetti" and no "candied nuts" but regular nuts OK.   2 HARD BOILED EGG WHITES AND A CUP OF one of these greek yogurts:    dannon lt n fit greek yogurt         chobani 100 greek yogurt,    Oikos triple zero  greek yogurt       No pretzels or chips.  Pickles and miniature sweet peppers are a good low  carb alternative that provide a "crunch"  The bread is the only source of carbohydrate in a sandwich and  can be decreased by trying some of these alternatives to traditional loaf bread:   Joseph's pita bread and Lavash (flat) bread :  50 cal and 4 net carbs  available at BJs and WalMart.  Taste better when toasted, use as pita chips  [employer] makes a variety of  flatbreads and  A PITA POCKET.    LOOK FOR  THE ONES THAT ARE 17 NET CARBS OR LESS    Mission makes 2 sizes of  Low carb whole wheat tortillas  (The large one is  210 cal and 6 net carbs)   Avoid "Low fat dressings, as well as Barry Brunner and [place] dressings    3 PM/ Mid day  Snack:  Consider  1 ounce of  almonds, walnuts, pistachios, pecans, peanuts,  Macadamia nuts or a nut medley that does not contain raisins or cranberries.  No "granola"; the dried cranberries  and raisins are loaded with carbohydrates. Mixed nuts as long as there are no raisins,  cranberries or dried fruit.    Try the prosciutto/mozzarella cheese sticks by [employer]  In deli /backery section   High protein   To avoid overindulging in snacks: Try drinking a glass of unsweeted almond/coconut milk  Or a cup of coffee with your Atkins chocolate bar to keep you from having 3!!!   Pork rinds!  Yes Pork Rinds are low carb potato chip substitute!   Toasted Joseph's flatbread with hummous dip (chickpeas)       6 PM  Dinner:     Meat/fowl/fish with a green salad, and either broccoli, cauliflower, green beans, spinach, brussel sprouts, bok choy or  Lima beans. Fried in canola oil /olive oil BUT DO NOT BREAD THE PROTEIN!!      There is a low carb pasta by Dreamfield's that is acceptable and tastes great: only 5 digestible carbs/serving.( All grocery stores but BJs carry it )  Prepared Meals:  Try Hurley Cisco Angelo's chicken piccata or chicken or eggplant parm over low carb  pasta.(Lowes and BJs)   Marjory Lies Sanchez's "Carnitas" (pulled pork, no sauce,  0 carbs) or his beef pot roast to make a dinner burrito (at [employer])  Barbecue with cole slaw is low carb BUT NO BUN!  SAME WITH HAMBURGERS     Whole wheat pasta is still full of digestible carbs and  Not as low in glycemic index as Dreamfield's.   Brown rice is still rice,  So skip the rice and noodles if you eat [place] or [place] (or at least limit to 1/2 cup)  9 PM snack :   Breyer's "low carb" fudgsicle or  ice cream bar (Carb Smart line), or  Weight  Watcher's ice cream bar , or another "no sugar added" ice cream;  a serving of fresh berries/cherries with whipped cream   Cheese or greek yogurt   8 ounces of [place] unsweetened almond/cococunut milk  Cheese and crackers (using WASA crackers,  They are low carb) or peanut butter on low carb crackers or pita bread     Avoid bananas, pineapple, grapes  and watermelon on a regular basis because they are high in sugar.  THINK OF THEM AS DESSERT and do not have daily   Remember that snack Substitutions should be less than 10 NET carbs per serving and meals should be < 20 net carbs. Remember that carbohydrates from fiber do not affect blood sugar, so you can  subtract fiber grams to get the "net carbs " of any particular food item.

## 2014-09-26 ENCOUNTER — Other Ambulatory Visit: Payer: Self-pay | Admitting: Internal Medicine

## 2014-09-26 DIAGNOSIS — M7550 Bursitis of unspecified shoulder: Secondary | ICD-10-CM | POA: Insufficient documentation

## 2014-09-26 DIAGNOSIS — M7552 Bursitis of left shoulder: Secondary | ICD-10-CM

## 2014-09-26 LAB — CA 125: CA 125: 29 U/mL (ref <35)

## 2014-09-27 ENCOUNTER — Encounter: Payer: Self-pay | Admitting: *Deleted

## 2014-09-27 NOTE — Telephone Encounter (Signed)
Form completed and faxed copy made for billing and for scan to chart.

## 2014-09-27 NOTE — Telephone Encounter (Signed)
Fax from Medicare, PA denied. Paperwork placed in [facility]ullo's box.

## 2014-09-28 ENCOUNTER — Encounter: Payer: Self-pay | Admitting: Internal Medicine

## 2014-09-28 DIAGNOSIS — K432 Incisional hernia without obstruction or gangrene: Secondary | ICD-10-CM | POA: Insufficient documentation

## 2014-09-28 DIAGNOSIS — E669 Obesity, unspecified: Secondary | ICD-10-CM | POA: Insufficient documentation

## 2014-09-28 HISTORY — DX: Incisional hernia without obstruction or gangrene: K43.2

## 2014-09-28 HISTORY — DX: Obesity, unspecified: E66.9

## 2014-09-28 NOTE — Assessment & Plan Note (Signed)
Well controlled on current regimen. Renal function stable, no changes today.  Lab Results  Component Value Date   CREATININE 1.14 09/25/2014   Lab Results  Component Value Date   NA 141 09/25/2014   K 4.9 09/25/2014   CL 105 09/25/2014   CO2 31 09/25/2014

## 2014-09-28 NOTE — Assessment & Plan Note (Signed)
Secondary to lap appy 2009.  Give concurrent recurrent lower abdominal pain and distension ,  FH of ovarian, CA in sister,  Need to rule out intrabdominal mass.  Ct abd and pelvic CT ordered.

## 2014-09-28 NOTE — Assessment & Plan Note (Signed)
LDL and triglycerides are at goal on current medications. She has no side effects and liver enzymes are normal. No changes today   Lab Results  Component Value Date   CHOL 161 09/25/2014   HDL 52.80 09/25/2014   LDLCALC 90 09/25/2014   TRIG 91.0 09/25/2014   CHOLHDL 3 09/25/2014   Lab Results  Component Value Date   ALT 25 09/25/2014   AST 23 09/25/2014   ALKPHOS 98 09/25/2014   BILITOT 1.5* 09/25/2014

## 2014-09-28 NOTE — Assessment & Plan Note (Signed)
Will check GFR today and recommend topical NSAIDS if covered by insurance  Will consider plains of shoulder to rule out osteophyte formation at top of humerus as cause.  AUrged to consider ports medicine  Vs steroid injection

## 2014-09-28 NOTE — Assessment & Plan Note (Signed)
I have addressed  BMI and recommended wt loss of 10% of body weight over the next 6 months using a low glycemic index diet and regular exercise a minimum of 5 days per week.   

## 2014-09-29 DIAGNOSIS — Z7689 Persons encountering health services in other specified circumstances: Secondary | ICD-10-CM

## 2014-10-01 ENCOUNTER — Ambulatory Visit: Payer: 59

## 2014-10-01 ENCOUNTER — Telehealth: Payer: Self-pay

## 2014-10-01 NOTE — Telephone Encounter (Signed)
[facility] called hoping to get further information regarding the authorization of the patient's order

## 2014-10-02 ENCOUNTER — Ambulatory Visit
Admission: RE | Admit: 2014-10-02 | Discharge: 2014-10-02 | Disposition: A | Discharge status: Home / Self Care | Payer: Medicare Other | Source: Ambulatory Visit | Attending: Internal Medicine | Admitting: Internal Medicine

## 2014-10-02 DIAGNOSIS — M5136 Other intervertebral disc degeneration, lumbar region: Secondary | ICD-10-CM | POA: Diagnosis not present

## 2014-10-02 DIAGNOSIS — M5137 Other intervertebral disc degeneration, lumbosacral region: Secondary | ICD-10-CM | POA: Diagnosis not present

## 2014-10-02 DIAGNOSIS — K439 Ventral hernia without obstruction or gangrene: Secondary | ICD-10-CM | POA: Insufficient documentation

## 2014-10-02 DIAGNOSIS — R1012 Left upper quadrant pain: Secondary | ICD-10-CM | POA: Diagnosis present

## 2014-10-02 DIAGNOSIS — R1032 Left lower quadrant pain: Secondary | ICD-10-CM | POA: Diagnosis present

## 2014-10-02 DIAGNOSIS — K432 Incisional hernia without obstruction or gangrene: Secondary | ICD-10-CM

## 2014-10-02 DIAGNOSIS — K5730 Diverticulosis of large intestine without perforation or abscess without bleeding: Secondary | ICD-10-CM | POA: Diagnosis not present

## 2014-10-02 DIAGNOSIS — R140 Abdominal distension (gaseous): Secondary | ICD-10-CM

## 2014-10-02 MED ORDER — IOHEXOL 300 MG/ML  SOLN
100.0000 mL | Freq: Once | INTRAMUSCULAR | Status: AC | PRN
  Administered 2014-10-02: 100 mL via INTRAVENOUS

## 2014-10-08 ENCOUNTER — Encounter: Payer: Self-pay | Admitting: Internal Medicine

## 2014-10-08 ENCOUNTER — Encounter: Payer: Self-pay | Admitting: *Deleted

## 2014-10-09 NOTE — Telephone Encounter (Signed)
Please advise 

## 2014-10-18 ENCOUNTER — Telehealth: Payer: Self-pay | Admitting: Internal Medicine

## 2014-10-18 NOTE — Telephone Encounter (Signed)
PA started for Voltaren Gel on cover my meds,

## 2014-10-24 ENCOUNTER — Telehealth: Payer: Self-pay | Admitting: Internal Medicine

## 2014-10-24 NOTE — Telephone Encounter (Signed)
PA for diclofenac gel received and signed . returned in red folder to [facility]

## 2014-10-25 NOTE — Telephone Encounter (Signed)
Faxed by Jacqlyn Larsen

## 2014-10-29 ENCOUNTER — Encounter: Payer: Self-pay | Admitting: Internal Medicine

## 2014-10-29 ENCOUNTER — Other Ambulatory Visit: Payer: Self-pay | Admitting: Internal Medicine

## 2014-12-25 ENCOUNTER — Encounter: Payer: 59 | Admitting: Internal Medicine

## 2015-01-27 ENCOUNTER — Other Ambulatory Visit: Payer: Self-pay | Admitting: Internal Medicine

## 2015-02-10 ENCOUNTER — Encounter: Payer: 59 | Admitting: Internal Medicine

## 2015-03-05 ENCOUNTER — Encounter: Payer: Self-pay | Admitting: Internal Medicine

## 2015-03-05 ENCOUNTER — Ambulatory Visit (INDEPENDENT_AMBULATORY_CARE_PROVIDER_SITE_OTHER): Payer: Medicare Other | Admitting: Internal Medicine

## 2015-03-05 VITALS — BP 124/76 | HR 48 | Temp 97.9°F | Resp 12 | Ht 63.0 in | Wt 174.1 lb

## 2015-03-05 DIAGNOSIS — Z23 Encounter for immunization: Secondary | ICD-10-CM

## 2015-03-05 DIAGNOSIS — M791 Myalgia, unspecified site: Secondary | ICD-10-CM

## 2015-03-05 DIAGNOSIS — E669 Obesity, unspecified: Secondary | ICD-10-CM | POA: Diagnosis not present

## 2015-03-05 DIAGNOSIS — Z0000 Encounter for general adult medical examination without abnormal findings: Secondary | ICD-10-CM | POA: Diagnosis not present

## 2015-03-05 DIAGNOSIS — E785 Hyperlipidemia, unspecified: Secondary | ICD-10-CM

## 2015-03-05 DIAGNOSIS — I10 Essential (primary) hypertension: Secondary | ICD-10-CM | POA: Diagnosis not present

## 2015-03-05 NOTE — Progress Notes (Signed)
Pre-visit discussion using our clinic review tool. No additional management support is needed unless otherwise documented below in the visit note.  

## 2015-03-05 NOTE — Progress Notes (Signed)
Patient ID: Kathy Long, female    DOB: Mar 21, 1951  Age: 64 y.o. MRN: [id number]  The patient is here for Medicare annual  examination and management of other chronic and acute problems.   The risk factors are reflected in the social history.  The roster of all physicians providing medical care to patient - is listed in the Snapshot section of the chart.  Activities of daily living:  The patient is 100% independent in all ADLs: dressing, toileting, feeding as well as independent mobility  Home safety : The patient has smoke detectors in the home. They wear seatbelts.  There are no firearms at home. There is no violence in the home.   There is no risks for hepatitis, STDs or HIV. There is no   history of blood transfusion. They have no travel history to infectious disease endemic areas of the world.  The patient has seen their dentist in the last six month. They have seen their eye doctor in the last year. They admit to slight hearing difficulty with regard to whispered voices and some television programs.  They have deferred audiologic testing in the last year.  They do not  have excessive sun exposure. Discussed the need for sun protection: hats, long sleeves and use of sunscreen if there is significant sun exposure.   Diet: the importance of a healthy diet is discussed. They do have a healthy diet.  The benefits of regular aerobic exercise were discussed. She walks 4 times per week ,  20 minutes.   Depression screen: there are no signs or vegative symptoms of depression- irritability, change in appetite, anhedonia, sadness/tearfullness.  Cognitive assessment: the patient manages all their financial and personal affairs and is actively engaged. They could relate day,date,year and events; recalled 2/3 objects at 3 minutes; performed clock-face test normally.  The following portions of the patient's history were reviewed and updated as appropriate: allergies, current medications, past family  history, past medical history,  past surgical history, past social history  and problem list.  Visual acuity was not assessed per patient preference since she has regular follow up with her ophthalmologist. Hearing and body mass index were assessed and reviewed.   During the course of the visit the patient was educated and counseled about appropriate screening and preventive services including : fall prevention , diabetes screening, nutrition counseling, colorectal cancer screening, and recommended immunizations.    CC: The primary encounter diagnosis was Encounter for immunization. Diagnoses of Myalgia, Essential hypertension, Hyperlipidemia LDL goal <130, Obesity, and Medicare annual wellness visit, subsequent were also pertinent to this visit.   treated for viral pharyngitis 6 weeks ago while in [place],  Then treated with augmenitn for 10 days with minimal improvement .  Snoring increased and husband noticed choking  A lot.  Saw [facility]Queen.  Ct scan of sinuses clear.  PFTS,  Allergy testing all normal.,. Recommended tonsillectomy  Has been having increased myalgias , may have coincided with initiation of atorvastatin   Has lost 10 lbs by home scales doing a restricted diet .    History Kathy Long has a past medical history of Depression; Hypertension; Hyperlipidemia; and Cancer ([place]).   She has past surgical history that includes Melanoma excision (Left, 2000) and Appendectomy.   Her family history includes Cancer (age of onset: 35) in her sister.She reports that she has never smoked. She does not have any smokeless tobacco history on file. Her alcohol and drug histories are not on file.  Outpatient Prescriptions Prior to  Visit  Medication Sig Dispense Refill  . amLODipine (NORVASC) 5 MG tablet Take 5 mg by mouth daily.    Marland Kitchen atorvastatin (LIPITOR) 20 MG tablet Take 1 tablet (20 mg total) by mouth daily. 90 tablet 3  . Cholecalciferol 1000 UNITS tablet Take 1,000 Units by mouth 2 (two)  times daily.    . citalopram (CELEXA) 20 MG tablet TAKE ONE TABLET EVERY DAY 90 tablet 1  . diclofenac sodium (VOLTAREN) 1 % GEL Apply 2 g topically 4 (four) times daily. 100 g 2  . Melatonin 3 MG CAPS Take 3 mg by mouth at bedtime.    . metoprolol succinate (TOPROL-XL) 50 MG 24 hr tablet TAKE 1 TABLET EVERY DAY 90 tablet 0  . Omega-3 Krill Oil 300 MG CAPS Take 300 mg by mouth daily.    Marland Kitchen amLODipine (NORVASC) 5 MG tablet Take 5 mg by mouth daily.    Marland Kitchen atorvastatin (LIPITOR) 20 MG tablet Take 20 mg by mouth daily.    Marland Kitchen omeprazole (PRILOSEC) 40 MG capsule Take 40 mg by mouth daily.    Marland Kitchen omeprazole (PRILOSEC) 40 MG capsule Take 1 capsule (40 mg total) by mouth 2 (two) times daily. 60 capsule 5   No facility-administered medications prior to visit.    Review of Systems   Patient denies headache, fevers, malaise, unintentional weight loss, skin rash, eye pain, sinus congestion and sinus pain, sore throat, dysphagia,  hemoptysis , cough, dyspnea, wheezing, chest pain, palpitations, orthopnea, edema, abdominal pain, nausea, melena, diarrhea, constipation, flank pain, dysuria, hematuria, urinary  Frequency, nocturia, numbness, tingling, seizures,  Focal weakness, Loss of consciousness,  Tremor, insomnia, depression, anxiety, and suicidal ideation.      Objective:  BP 124/76 mmHg  Pulse 48  Temp(Src) 97.9 F (36.6 C) (Oral)  Resp 12  Ht 5\' 3"  (1.6 m)  Wt 174 lb 2 oz (78.983 kg)  BMI 30.85 kg/m2  SpO2 98%  LMP  (Approximate)  Physical Exam   General appearance: alert, cooperative and appears stated age Head: Normocephalic, without obvious abnormality, atraumatic Eyes: conjunctivae/corneas clear. PERRL, EOM's intact. Fundi benign. Ears: normal TM's and external ear canals both ears Nose: Nares normal. Septum midline. Mucosa normal. No drainage or sinus tenderness. Throat: lips, mucosa, and tongue normal; teeth and gums normal Neck: no adenopathy, no carotid bruit, no JVD, supple,  symmetrical, trachea midline and thyroid not enlarged, symmetric, no tenderness/mass/nodules Lungs: clear to auscultation bilaterally Breasts: normal appearance, no masses or tenderness Heart: regular rate and rhythm, S1, S2 normal, no murmur, click, rub or gallop Abdomen: soft, non-tender; bowel sounds normal; no masses,  no organomegaly Extremities: extremities normal, atraumatic, no cyanosis or edema Pulses: 2+ and symmetric Skin: Skin color, texture, turgor normal. No rashes or lesions Neurologic: Alert and oriented X 3, normal strength and tone. Normal symmetric reflexes. Normal coordination and gait.    Assessment & Plan:   Problem List Items Addressed This Visit    Hyperlipidemia LDL goal <130    LDL and triglycerides are at goal on current medications. She has been having myalgias.  Medication  suspended for a trial of  3 months  Lab Results  Component Value Date   CHOL 161 09/25/2014   HDL 52.80 09/25/2014   LDLCALC 90 09/25/2014   TRIG 91.0 09/25/2014   CHOLHDL 3 09/25/2014   Lab Results  Component Value Date   ALT 25 09/25/2014   AST 23 09/25/2014   ALKPHOS 98 09/25/2014   BILITOT 1.5* 09/25/2014  Essential hypertension    Well controlled on current regimen. Renal function stable, no changes today.  Lab Results  Component Value Date   CREATININE 1.14 09/25/2014   Lab Results  Component Value Date   NA 141 09/25/2014   K 4.9 09/25/2014   CL 105 09/25/2014   CO2 31 09/25/2014         Obesity    I have congratulated her in reduction of   BMI and encouraged  Continued weight loss with goal of 10% of body weigh over the next 6 months using a low glycemic index diet and regular exercise a minimum of 5 days per week.        Myalgia    May be statin induced.  Will suspend Lipitor for a few months and reassess.  Consider Crestor if myalgias resolve,  Resume lipitor if they do not. .      Medicare annual wellness visit, subsequent    Annual  Medicare wellness  exam was done as well as a comprehensive physical exam and management of acute and chronic conditions .  During the course of the visit the patient was educated and counseled about appropriate screening and preventive services including : fall prevention , diabetes screening, nutrition counseling, colorectal cancer screening, and recommended immunizations.  Printed recommendations for health maintenance screenings was given.        Other Visit Diagnoses    Encounter for immunization    -  Primary       I have discontinued Ms. Serviss's omeprazole and omeprazole. I am also having her maintain her atorvastatin, metoprolol succinate, amLODipine, Cholecalciferol, Melatonin, Omega-3 Krill Oil, diclofenac sodium, citalopram, and DEXILANT.  Meds ordered this encounter  Medications  . DEXILANT 30 MG capsule    Sig: Take 30 mg by mouth daily.     Medications Discontinued During This Encounter  Medication Reason  . omeprazole (PRILOSEC) 40 MG capsule Change in therapy  . atorvastatin (LIPITOR) 20 MG tablet Duplicate  . amLODipine (NORVASC) 5 MG tablet Duplicate  . omeprazole (PRILOSEC) 40 MG capsule Change in therapy    Follow-up: No Follow-up on file.   [name], MD

## 2015-03-05 NOTE — Patient Instructions (Signed)
Suspend the atorvastatin for one month.  If the muscle aches subside,  We will try a higher potency statin called crestor ,  At a lower dose to see if you tolerate it.  If the muscle aches do NOT subside,  We will resume atorvastatin at 40 mg daily  One full strength aspirin once a week for pimary prevention of stroke  Congrats on the weight loss.  Remember that there  are MANY diets that will work, but they must be  lifestyle changes accompanied by aerobic exercise  5 days a week to achieve and maintain your goal weight.  Finding the one that satisfies you is the key   The  diet I discussed with you today is the 10 day Green Smoothie Cleansing /Detox Diet by Linden Dolin . available on [place] for around $10.  This is not a low carb or a weight loss diet,  It is fundamentally a "cleansing" low fat diet that eliminates sugar, gluten, caffeine, alcohol and dairy for 10 days .  What you add back after the initial ten days is entirely up to  you!  You can expect to lose 5 to 10 lbs depending on how strict you are.   I suggest drinking 2 liquid meals (juices or smoothies)  daily and keeping one chewable meal (but keep it simple, like baked fish and salad, rice or bok choy) .  You can also snack,  primarily on fresh  fruit, egg whites and judicious quantities of nuts. You can add vegetable based protein powder (nothing with whey , since whey is dairy) in it.  [employer] has a Research officer, political party .  It does require some form of a nutrient extractor (Vita Mix, a electric juicer,  Or a Nutribullet Rx).  i have found that using frozen fruits is much more convenient and cost effective. You can even find plenty of organic fruit in the frozen fruit section of [employer].  Just thaw what you need for the following day the night before in the refrigerator (to avoid jamming up your juicer/blender)   I will order your mammogram  This ends the "free text " comments /summary.  The rest is a "required " handout   Menopause is a  normal process in which your reproductive ability comes to an end. This process happens gradually over a span of months to years, usually between the ages of 33 and 45. Menopause is complete when you have missed 12 consecutive menstrual periods. It is important to talk with your health care provider about some of the most common conditions that affect postmenopausal women, such as heart disease, cancer, and bone loss (osteoporosis). Adopting a healthy lifestyle and getting preventive care can help to promote your health and wellness. Those actions can also lower your chances of developing some of these common conditions. WHAT SHOULD I KNOW ABOUT MENOPAUSE? During menopause, you may experience a number of symptoms, such as:  Moderate-to-severe hot flashes.  Night sweats.  Decrease in sex drive.  Mood swings.  Headaches.  Tiredness.  Irritability.  Memory problems.  Insomnia. Choosing to treat or not to treat menopausal changes is an individual decision that you make with your health care provider. WHAT SHOULD I KNOW ABOUT HORMONE REPLACEMENT THERAPY AND SUPPLEMENTS? Hormone therapy products are effective for treating symptoms that are associated with menopause, such as hot flashes and night sweats. Hormone replacement carries certain risks, especially as you become older. If you are thinking about using estrogen or estrogen with progestin  treatments, discuss the benefits and risks with your health care provider. WHAT SHOULD I KNOW ABOUT HEART DISEASE AND STROKE? Heart disease, heart attack, and stroke become more likely as you age. This may be due, in part, to the hormonal changes that your body experiences during menopause. These can affect how your body processes dietary fats, triglycerides, and cholesterol. Heart attack and stroke are both medical emergencies. There are many things that you can do to help prevent heart disease and stroke:  Have your blood pressure checked at least  every 1-2 years. High blood pressure causes heart disease and increases the risk of stroke.  If you are 20-35 years old, ask your health care provider if you should take aspirin to prevent a heart attack or a stroke.  Do not use any tobacco products, including cigarettes, chewing tobacco, or electronic cigarettes. If you need help quitting, ask your health care provider.  It is important to eat a healthy diet and maintain a healthy weight.  Be sure to include plenty of vegetables, fruits, low-fat dairy products, and lean protein.  Avoid eating foods that are high in solid fats, added sugars, or salt (sodium).  Get regular exercise. This is one of the most important things that you can do for your health.  Try to exercise for at least 150 minutes each week. The type of exercise that you do should increase your heart rate and make you sweat. This is known as moderate-intensity exercise.  Try to do strengthening exercises at least twice each week. Do these in addition to the moderate-intensity exercise.  Know your numbers.Ask your health care provider to check your cholesterol and your blood glucose. Continue to have your blood tested as directed by your health care provider. WHAT SHOULD I KNOW ABOUT CANCER SCREENING? There are several types of cancer. Take the following steps to reduce your risk and to catch any cancer development as early as possible. Breast Cancer  Practice breast self-awareness.  This means understanding how your breasts normally appear and feel.  It also means doing regular breast self-exams. Let your health care provider know about any changes, no matter how small.  If you are 14 or older, have a clinician do a breast exam (clinical breast exam or CBE) every year. Depending on your age, family history, and medical history, it may be recommended that you also have a yearly breast X-ray (mammogram).  If you have a family history of breast cancer, talk with your  health care provider about genetic screening.  If you are at high risk for breast cancer, talk with your health care provider about having an MRI and a mammogram every year.  Breast cancer (BRCA) gene test is recommended for women who have family members with BRCA-related cancers. Results of the assessment will determine the need for genetic counseling and BRCA1 and for BRCA2 testing. BRCA-related cancers include these types:  Breast. This occurs in males or females.  Ovarian.  Tubal. This may also be called fallopian tube cancer.  Cancer of the abdominal or pelvic lining (peritoneal cancer).  Prostate.  Pancreatic. Cervical, Uterine, and Ovarian Cancer Your health care provider may recommend that you be screened regularly for cancer of the pelvic organs. These include your ovaries, uterus, and vagina. This screening involves a pelvic exam, which includes checking for microscopic changes to the surface of your cervix (Pap test).  For women ages 21-65, health care providers may recommend a pelvic exam and a Pap test every three years. For  women ages 60-65, they may recommend the Pap test and pelvic exam, combined with testing for human papilloma virus (HPV), every five years. Some types of HPV increase your risk of cervical cancer. Testing for HPV may also be done on women of any age who have unclear Pap test results.  Other health care providers may not recommend any screening for nonpregnant women who are considered low risk for pelvic cancer and have no symptoms. Ask your health care provider if a screening pelvic exam is right for you.  If you have had past treatment for cervical cancer or a condition that could lead to cancer, you need Pap tests and screening for cancer for at least 20 years after your treatment. If Pap tests have been discontinued for you, your risk factors (such as having a new sexual partner) need to be reassessed to determine if you should start having screenings  again. Some women have medical problems that increase the chance of getting cervical cancer. In these cases, your health care provider may recommend that you have screening and Pap tests more often.  If you have a family history of uterine cancer or ovarian cancer, talk with your health care provider about genetic screening.  If you have vaginal bleeding after reaching menopause, tell your health care provider.  There are currently no reliable tests available to screen for ovarian cancer. Lung Cancer Lung cancer screening is recommended for adults 73-57 years old who are at high risk for lung cancer because of a history of smoking. A yearly low-dose CT scan of the lungs is recommended if you:  Currently smoke.  Have a history of at least 30 pack-years of smoking and you currently smoke or have quit within the past 15 years. A pack-year is smoking an average of one pack of cigarettes per day for one year. Yearly screening should:  Continue until it has been 15 years since you quit.  Stop if you develop a health problem that would prevent you from having lung cancer treatment. Colorectal Cancer  This type of cancer can be detected and can often be prevented.  Routine colorectal cancer screening usually begins at age 45 and continues through age 35.  If you have risk factors for colon cancer, your health care provider may recommend that you be screened at an earlier age.  If you have a family history of colorectal cancer, talk with your health care provider about genetic screening.  Your health care provider may also recommend using home test kits to check for hidden blood in your stool.  A small camera at the end of a tube can be used to examine your colon directly (sigmoidoscopy or colonoscopy). This is done to check for the earliest forms of colorectal cancer.  Direct examination of the colon should be repeated every 5-10 years until age 48. However, if early forms of precancerous  polyps or small growths are found or if you have a family history or genetic risk for colorectal cancer, you may need to be screened more often. Skin Cancer  Check your skin from head to toe regularly.  Monitor any moles. Be sure to tell your health care provider:  About any new moles or changes in moles, especially if there is a change in a mole's shape or color.  If you have a mole that is larger than the size of a pencil eraser.  If any of your family members has a history of skin cancer, especially at a young age, talk  with your health care provider about genetic screening.  Always use sunscreen. Apply sunscreen liberally and repeatedly throughout the day.  Whenever you are outside, protect yourself by wearing long sleeves, pants, a wide-brimmed hat, and sunglasses. WHAT SHOULD I KNOW ABOUT OSTEOPOROSIS? Osteoporosis is a condition in which bone destruction happens more quickly than new bone creation. After menopause, you may be at an increased risk for osteoporosis. To help prevent osteoporosis or the bone fractures that can happen because of osteoporosis, the following is recommended:  If you are 47-56 years old, get at least 1,000 mg of calcium and at least 600 mg of vitamin D per day.  If you are older than age 84 but younger than age 33, get at least 1,200 mg of calcium and at least 600 mg of vitamin D per day.  If you are older than age 24, get at least 1,200 mg of calcium and at least 800 mg of vitamin D per day. Smoking and excessive alcohol intake increase the risk of osteoporosis. Eat foods that are rich in calcium and vitamin D, and do weight-bearing exercises several times each week as directed by your health care provider. WHAT SHOULD I KNOW ABOUT HOW MENOPAUSE AFFECTS [place]? Depression may occur at any age, but it is more common as you become older. Common symptoms of depression include:  Low or sad mood.  Changes in sleep patterns.  Changes in appetite or  eating patterns.  Feeling an overall lack of motivation or enjoyment of activities that you previously enjoyed.  Frequent crying spells. Talk with your health care provider if you think that you are experiencing depression. WHAT SHOULD I KNOW ABOUT IMMUNIZATIONS? It is important that you get and maintain your immunizations. These include:  Tetanus, diphtheria, and pertussis (Tdap) booster vaccine.  Influenza every year before the flu season begins.  Pneumonia vaccine.  Shingles vaccine. Your health care provider may also recommend other immunizations.   This information is not intended to replace advice given to you by your health care provider. Make sure you discuss any questions you have with your health care provider.   Document Released: 07/09/2005 Document Revised: 06/07/2014 Document Reviewed: 01/17/2014 Elsevier Interactive Patient Education [employer].

## 2015-03-07 DIAGNOSIS — Z0000 Encounter for general adult medical examination without abnormal findings: Secondary | ICD-10-CM | POA: Insufficient documentation

## 2015-03-07 DIAGNOSIS — M791 Myalgia, unspecified site: Secondary | ICD-10-CM | POA: Insufficient documentation

## 2015-03-07 NOTE — Assessment & Plan Note (Signed)

## 2015-03-07 NOTE — Assessment & Plan Note (Signed)
Well controlled on current regimen. Renal function stable, no changes today.  Lab Results  Component Value Date   CREATININE 1.14 09/25/2014   Lab Results  Component Value Date   NA 141 09/25/2014   K 4.9 09/25/2014   CL 105 09/25/2014   CO2 31 09/25/2014    

## 2015-03-07 NOTE — Assessment & Plan Note (Addendum)
May be statin induced.  Will suspend Lipitor for a few months and reassess.  Consider Crestor if myalgias resolve,  Resume lipitor if they do not. Marland Kitchen

## 2015-03-07 NOTE — Assessment & Plan Note (Signed)
LDL and triglycerides are at goal on current medications. She has been having myalgias.  Medication  suspended for a trial of  3 months  Lab Results  Component Value Date   CHOL 161 09/25/2014   HDL 52.80 09/25/2014   LDLCALC 90 09/25/2014   TRIG 91.0 09/25/2014   CHOLHDL 3 09/25/2014   Lab Results  Component Value Date   ALT 25 09/25/2014   AST 23 09/25/2014   ALKPHOS 98 09/25/2014   BILITOT 1.5* 09/25/2014

## 2015-03-07 NOTE — Assessment & Plan Note (Signed)
I have congratulated her in reduction of   BMI and encouraged  Continued weight loss with goal of 10% of body weigh over the next 6 months using a low glycemic index diet and regular exercise a minimum of 5 days per week.    

## 2015-03-26 ENCOUNTER — Other Ambulatory Visit: Payer: Medicare Other

## 2015-03-26 ENCOUNTER — Encounter
Admission: RE | Admit: 2015-03-26 | Discharge: 2015-03-26 | Disposition: A | Discharge status: Home / Self Care | Payer: Medicare Other | Source: Ambulatory Visit | Attending: Unknown Physician Specialty | Admitting: Unknown Physician Specialty

## 2015-03-26 DIAGNOSIS — G4733 Obstructive sleep apnea (adult) (pediatric): Secondary | ICD-10-CM | POA: Diagnosis not present

## 2015-03-26 DIAGNOSIS — Z01818 Encounter for other preprocedural examination: Secondary | ICD-10-CM | POA: Diagnosis not present

## 2015-03-26 DIAGNOSIS — J3501 Chronic tonsillitis: Secondary | ICD-10-CM | POA: Diagnosis not present

## 2015-03-26 HISTORY — DX: Benign neoplasm of cranial nerves: D33.3

## 2015-03-26 NOTE — Patient Instructions (Signed)
  Your procedure is scheduled on:04/01/15 Report to Day Surgery. [facility] To find out your arrival time please call [phone] between 1PM - 3PM on 03/31/15.  Remember: Instructions that are not followed completely may result in serious medical risk, up to and including death, or upon the discretion of your surgeon and anesthesiologist your surgery may need to be rescheduled.    ____ 1. Do not eat food or drink liquids after midnight. No gum chewing or hard candies.     ____ 2. No Alcohol for 24 hours before or after surgery.   ____ 3. Bring all medications with you on the day of surgery if instructed.    ____ 4. Notify your doctor if there is any change in your medical condition     (cold, fever, infections).     Do not wear jewelry, make-up, hairpins, clips or nail polish.  Do not wear lotions, powders, or perfumes. You may wear deodorant.  Do not shave 48 hours prior to surgery. Men may shave face and neck.  Do not bring valuables to the hospital.    [facility] is not responsible for any belongings or valuables.               Contacts, dentures or bridgework may not be worn into surgery.  Leave your suitcase in the car. After surgery it may be brought to your room.  For patients admitted to the hospital, discharge time is determined by your                treatment team.   Patients discharged the day of surgery will not be allowed to drive home.   Please read over the following fact sheets that you were given:   Surgical Site Infection Prevention   ____ Take these medicines the morning of surgery with A SIP OF WATER:    1. CELEXA  2. DEXILANT  3.   4.  5.  6.  ____ Fleet Enema (as directed)   ____ Use CHG Soap as directed  ____ Use inhalers on the day of surgery  ____ Stop metformin 2 days prior to surgery    ____ Take 1/2 of usual insulin dose the night before surgery and none on the morning of surgery.   ____ Stop Coumadin/Plavix/aspirin on    ____ Stop Anti-inflammatories on    __X__ Stop supplements until after surgery. STOP MELATONIN UNTIL AFTER SURGERY   ____ Bring C-Pap to the hospital.

## 2015-04-01 ENCOUNTER — Ambulatory Visit
Admission: RE | Admit: 2015-04-01 | Payer: Medicare Other | Source: Ambulatory Visit | Admitting: Unknown Physician Specialty

## 2015-04-01 ENCOUNTER — Other Ambulatory Visit: Payer: Self-pay | Admitting: Internal Medicine

## 2015-04-01 ENCOUNTER — Encounter: Admission: RE | Payer: Self-pay | Source: Ambulatory Visit

## 2015-04-01 SURGERY — TONSILLECTOMY
Anesthesia: Choice

## 2015-04-08 ENCOUNTER — Ambulatory Visit: Payer: Medicare Other | Admitting: Anesthesiology

## 2015-04-08 ENCOUNTER — Encounter: Payer: Self-pay | Admitting: *Deleted

## 2015-04-08 ENCOUNTER — Encounter
Admission: RE | Disposition: A | Discharge status: Home / Self Care | Payer: Self-pay | Source: Ambulatory Visit | Attending: Unknown Physician Specialty

## 2015-04-08 ENCOUNTER — Ambulatory Visit
Admission: RE | Admit: 2015-04-08 | Discharge: 2015-04-08 | Disposition: A | Discharge status: Home / Self Care | Payer: Medicare Other | Source: Ambulatory Visit | Attending: Unknown Physician Specialty | Admitting: Unknown Physician Specialty

## 2015-04-08 DIAGNOSIS — K219 Gastro-esophageal reflux disease without esophagitis: Secondary | ICD-10-CM | POA: Diagnosis not present

## 2015-04-08 DIAGNOSIS — Z87891 Personal history of nicotine dependence: Secondary | ICD-10-CM | POA: Diagnosis not present

## 2015-04-08 DIAGNOSIS — D333 Benign neoplasm of cranial nerves: Secondary | ICD-10-CM | POA: Insufficient documentation

## 2015-04-08 DIAGNOSIS — G709 Myoneural disorder, unspecified: Secondary | ICD-10-CM | POA: Insufficient documentation

## 2015-04-08 DIAGNOSIS — E785 Hyperlipidemia, unspecified: Secondary | ICD-10-CM | POA: Insufficient documentation

## 2015-04-08 DIAGNOSIS — J3501 Chronic tonsillitis: Secondary | ICD-10-CM | POA: Diagnosis present

## 2015-04-08 DIAGNOSIS — F329 Major depressive disorder, single episode, unspecified: Secondary | ICD-10-CM | POA: Insufficient documentation

## 2015-04-08 DIAGNOSIS — G4733 Obstructive sleep apnea (adult) (pediatric): Secondary | ICD-10-CM | POA: Diagnosis not present

## 2015-04-08 DIAGNOSIS — I10 Essential (primary) hypertension: Secondary | ICD-10-CM | POA: Insufficient documentation

## 2015-04-08 DIAGNOSIS — G4730 Sleep apnea, unspecified: Secondary | ICD-10-CM | POA: Diagnosis not present

## 2015-04-08 DIAGNOSIS — R599 Enlarged lymph nodes, unspecified: Secondary | ICD-10-CM | POA: Insufficient documentation

## 2015-04-08 HISTORY — PX: PALATOPLASTY: SHX6000

## 2015-04-08 HISTORY — PX: TONSILLECTOMY: SHX5217

## 2015-04-08 SURGERY — TONSILLECTOMY
Anesthesia: General | Site: Throat

## 2015-04-08 MED ORDER — PROMETHAZINE HCL 25 MG/ML IJ SOLN: 6.2500 mg | INTRAMUSCULAR | Status: DC | PRN

## 2015-04-08 MED ORDER — ROCURONIUM BROMIDE 100 MG/10ML IV SOLN
INTRAVENOUS | Status: DC | PRN
  Administered 2015-04-08: 20 mg via INTRAVENOUS

## 2015-04-08 MED ORDER — GLYCOPYRROLATE 0.2 MG/ML IJ SOLN
INTRAMUSCULAR | Status: DC | PRN
  Administered 2015-04-08: 0.2 mg via INTRAVENOUS

## 2015-04-08 MED ORDER — FENTANYL CITRATE (PF) 100 MCG/2ML IJ SOLN
INTRAMUSCULAR | Status: AC
  Filled 2015-04-08: qty 2

## 2015-04-08 MED ORDER — BUPIVACAINE HCL (PF) 0.5 % IJ SOLN
INTRAMUSCULAR | Status: AC
  Filled 2015-04-08: qty 30

## 2015-04-08 MED ORDER — BUPIVACAINE-EPINEPHRINE (PF) 0.5% -1:200000 IJ SOLN
INTRAMUSCULAR | Status: DC | PRN
  Administered 2015-04-08: 3 mL via PERINEURAL

## 2015-04-08 MED ORDER — ONDANSETRON HCL 8 MG PO TABS: 8.0000 mg | ORAL_TABLET | Freq: Three times a day (TID) | ORAL | Status: DC | PRN

## 2015-04-08 MED ORDER — HYDROCODONE-ACETAMINOPHEN 7.5-325 MG/15ML PO SOLN
15.0000 mL | ORAL | Status: DC | PRN
Start: 2015-04-08 — End: 2016-06-02

## 2015-04-08 MED ORDER — FENTANYL CITRATE (PF) 100 MCG/2ML IJ SOLN
INTRAMUSCULAR | Status: DC | PRN
  Administered 2015-04-08: 150 ug via INTRAVENOUS

## 2015-04-08 MED ORDER — HYDROCODONE-ACETAMINOPHEN 7.5-325 MG/15ML PO SOLN
15.0000 mL | ORAL | Status: DC | PRN
  Administered 2015-04-08: 15 mL via ORAL
  Filled 2015-04-08 (×2): qty 15

## 2015-04-08 MED ORDER — PROPOFOL 10 MG/ML IV BOLUS
INTRAVENOUS | Status: DC | PRN
Start: 2015-04-08 — End: 2015-04-08
  Administered 2015-04-08: 120 mg via INTRAVENOUS

## 2015-04-08 MED ORDER — MIDAZOLAM HCL 2 MG/2ML IJ SOLN
INTRAMUSCULAR | Status: DC | PRN
  Administered 2015-04-08: 2 mg via INTRAVENOUS

## 2015-04-08 MED ORDER — BUPIVACAINE HCL (PF) 0.5 % IJ SOLN
INTRAMUSCULAR | Status: DC | PRN
  Administered 2015-04-08: 5 mL

## 2015-04-08 MED ORDER — DEXAMETHASONE SODIUM PHOSPHATE 4 MG/ML IJ SOLN
INTRAMUSCULAR | Status: DC | PRN
  Administered 2015-04-08: 8 mg via INTRAVENOUS

## 2015-04-08 MED ORDER — BUPIVACAINE-EPINEPHRINE (PF) 0.5% -1:200000 IJ SOLN
INTRAMUSCULAR | Status: AC
  Filled 2015-04-08: qty 30

## 2015-04-08 MED ORDER — SUGAMMADEX SODIUM 200 MG/2ML IV SOLN
INTRAVENOUS | Status: DC | PRN
  Administered 2015-04-08: 150.6 mg via INTRAVENOUS

## 2015-04-08 MED ORDER — FENTANYL CITRATE (PF) 100 MCG/2ML IJ SOLN
25.0000 ug | INTRAMUSCULAR | Status: DC | PRN
  Administered 2015-04-08: 50 ug via INTRAVENOUS

## 2015-04-08 MED ORDER — LIDOCAINE-EPINEPHRINE 0.5 %-1:200000 IJ SOLN
INTRAMUSCULAR | Status: AC
  Filled 2015-04-08: qty 1

## 2015-04-08 MED ORDER — LACTATED RINGERS IV SOLN
INTRAVENOUS | Status: DC
  Administered 2015-04-08: 10:00:00 via INTRAVENOUS

## 2015-04-08 MED ORDER — ONDANSETRON HCL 4 MG/2ML IJ SOLN
INTRAMUSCULAR | Status: DC | PRN
  Administered 2015-04-08: 4 mg via INTRAVENOUS

## 2015-04-08 SURGICAL SUPPLY — 14 items
BLADE SURG SZ20 CARB STEEL (BLADE) ×3 IMPLANT
CANISTER SUCT 1200ML W/VALVE (MISCELLANEOUS) ×3 IMPLANT
CATH ROBINSON RED A/P 10FR (CATHETERS) IMPLANT
COAG SUCT 10F 3.5MM HAND CTRL (MISCELLANEOUS) ×3 IMPLANT
ELECT CAUTERY BLADE TIP 2.5 (TIP) ×3
ELECTRODE CAUTERY BLDE TIP 2.5 (TIP) ×2 IMPLANT
GLOVE BIO SURGEON STRL SZ7.5 (GLOVE) ×6 IMPLANT
HANDLE SUCTION POOLE (INSTRUMENTS) ×2 IMPLANT
LABEL OR SOLS (LABEL) ×3 IMPLANT
NS IRRIG 500ML POUR BTL (IV SOLUTION) ×3 IMPLANT
PACK HEAD/NECK (MISCELLANEOUS) ×3 IMPLANT
PAD GROUND ADULT SPLIT (MISCELLANEOUS) ×3 IMPLANT
SUCTION POOLE HANDLE (INSTRUMENTS) ×3
SUT VIC AB 4-0 RB1 18 (SUTURE) ×3 IMPLANT

## 2015-04-08 NOTE — H&P (Signed)
  H+P  Reviewed and will be scanned in later. No changes noted. 

## 2015-04-08 NOTE — Anesthesia Procedure Notes (Signed)
Procedure Name: Intubation Date/Time: 04/08/2015 10:18 AM Performed by: Jonna Clark Pre-anesthesia Checklist: Patient identified, Patient being monitored, Timeout performed, Emergency Drugs available and Suction available Patient Re-evaluated:Patient Re-evaluated prior to inductionOxygen Delivery Method: Circle system utilized Preoxygenation: Pre-oxygenation with 100% oxygen Intubation Type: IV induction Ventilation: Mask ventilation without difficulty Laryngoscope Size: Mac and 3 Grade View: Grade I Tube type: Oral Rae Tube size: 7.0 mm Number of attempts: 1 Placement Confirmation: ETT inserted through vocal cords under direct vision,  positive ETCO2 and breath sounds checked- equal and bilateral Secured at: 21 cm Tube secured with: Tape Dental Injury: Teeth and Oropharynx as per pre-operative assessment

## 2015-04-08 NOTE — Anesthesia Preprocedure Evaluation (Signed)
Anesthesia Evaluation  Patient identified by MRN, date of birth, ID band Patient awake    Reviewed: Allergy & Precautions, H&P , NPO status , Patient's Chart, lab work & pertinent test results, reviewed documented beta blocker date and time   Airway Mallampati: II  TM Distance: >3 FB Neck ROM: full    Dental no notable dental hx. 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)    GI/Hepatic Neg liver ROS, GERD  Medicated and Controlled,  Endo/Other  negative endocrine ROS  Renal/GU negative Renal ROS  negative genitourinary   Musculoskeletal   Abdominal   Peds  Hematology negative hematology ROS (+)   Anesthesia Other Findings Past Medical History:   Depression                                                   Hypertension                                                 Hyperlipidemia                                               Cancer (HCC)                                                   Comment:melanoma, shoulder   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   Dental Advisory Given  Plan Discussed with: Anesthesiologist, CRNA and Surgeon  Anesthesia Plan Comments:         Anesthesia Quick Evaluation

## 2015-04-08 NOTE — Transfer of Care (Signed)
Immediate Anesthesia Transfer of Care Note  Patient: Kathy Long  Procedure(s) Performed: Procedure(s): TONSILLECTOMY (N/A) PALATOPLASTY (N/A)  Patient Location: PACU  Anesthesia Type:General  Level of Consciousness: patient cooperative and lethargic  Airway & Oxygen Therapy: Patient Spontanous Breathing and Patient connected to face mask oxygen  Post-op Assessment: Report given to RN and Post -op Vital signs reviewed and stable  Post vital signs: Reviewed and stable  Last Vitals:  Filed Vitals:   04/08/15 1057  BP: 131/90  Pulse: 50  Temp: 36.3 C  Resp: 21    Complications: No apparent anesthesia complications

## 2015-04-08 NOTE — Op Note (Signed)
PREOPERATIVE DIAGNOSIS:  Chronic tonsillitis, obstructive sleep apnea  POSTOPERATIVE DIAGNOSIS:  Chronic Tonsillitis  OPERATION:  Tonsillectomy and palatoplasty  SURGEON:  Roena Malady, MD  ANESTHESIA:  General endotracheal.  OPERATIVE FINDINGS:  Large tonsils and elongated uvula  DESCRIPTION OF THE PROCEDURE: Kathy Long was identified in the holding area and taken to the operating room and placed in the supine position.  After general endotracheal anesthesia, the table was turned 45 degrees and the patient was draped in the usual fashion for a tonsillectomy.  A mouth gag was inserted into the oral cavity.  There were large tonsils.  Beginning on the left-hand side a tenaculum was used to grasp the tonsil and the Bovie cautery was used to dissect it free from the fossa.  In a similar fashion, the right tonsil was removed.  Meticulous hemostasis was achieved using the Bovie cautery.  With both tonsils removed and no active bleeding, 0.5% plain Marcaine was used to inject the anterior and posterior tonsillar pillars bilaterally.  A total of 66mls was used.  Upon completion of the tonsillectomy the palate was then addressed it was found to be elongated. A local anesthetic of half percent Marcaine with 1-200,000 units of epinephrine was used to inject along the palate a total of 3 cc used. A 15 blade was then used to incise the mucosa above the palate from the intercostal pillars bilaterally to the posterior tonsillar pillars posteriorly this was beveled in an anterior to posterior fashion. This removed the uvula in its entirety. 2 small bleeding areas were cauterized using the suction cautery. Using 4-0 Vicryl the mucosal edges were reapproximated from a posterior to anterior fashion along the incision site of the uvula and extended laterally on both sides to reapproximate the edges of the anterior posterior tonsillar pillars. The pharynx was scope was irrigated with saline there was no active  bleeding. The patient tolerated the procedure well and was awakened in the operating room and taken to the recovery room in stable condition.   CULTURES:  None.  SPECIMENS:  Tonsils and uvula  ESTIMATED BLOOD LOSS:  Less than 10 ml.  Kathy Long  04/08/2015  10:44 AM

## 2015-04-08 NOTE — Anesthesia Postprocedure Evaluation (Signed)
  Anesthesia Post-op Note  Patient: Kathy Long  Procedure(s) Performed: Procedure(s): TONSILLECTOMY (N/A) PALATOPLASTY (N/A)  Anesthesia type:General  Patient location: PACU  Post pain: Pain level controlled  Post assessment: Post-op Vital signs reviewed, Patient's Cardiovascular Status Stable, Respiratory Function Stable, Patent Airway and No signs of Nausea or vomiting  Post vital signs: Reviewed and stable  Last Vitals:  Filed Vitals:   04/08/15 1300  BP: 159/81  Pulse: 56  Temp:   Resp: 16    Level of consciousness: awake, alert  and patient cooperative  Complications: No apparent anesthesia complications

## 2015-04-08 NOTE — Discharge Instructions (Signed)
AMBULATORY SURGERY  DISCHARGE INSTRUCTIONS   1) The drugs that you were given will stay in your system until tomorrow so for the next 24 hours you should not:  A) Drive an automobile B) Make any legal decisions C) Drink any alcoholic beverage   2) You may resume regular meals tomorrow.  Today it is better to start with liquids and gradually work up to solid foods.  You may eat anything you prefer, but it is better to start with liquids, then soup and crackers, and gradually work up to solid foods.   3) Please notify your doctor immediately if you have any unusual bleeding, trouble breathing, redness and pain at the surgery site, drainage, fever, or pain not relieved by medication.    4) Additional Instructions:        Please contact your physician with any problems or Same Day Surgery at [phone], Monday through Friday 6 am to 4 pm, or [place] [facility] number at [phone].\[id number]\

## 2015-04-10 LAB — SURGICAL PATHOLOGY

## 2015-04-27 ENCOUNTER — Other Ambulatory Visit: Payer: Self-pay | Admitting: Internal Medicine

## 2015-05-21 ENCOUNTER — Encounter: Payer: Self-pay | Admitting: Internal Medicine

## 2015-05-22 MED ORDER — ROSUVASTATIN CALCIUM 10 MG PO TABS: 10.0000 mg | ORAL_TABLET | Freq: Every day | ORAL | Status: DC

## 2015-05-22 MED ORDER — OMEPRAZOLE 40 MG PO CPDR: 40.0000 mg | DELAYED_RELEASE_CAPSULE | Freq: Two times a day (BID) | ORAL | Status: DC

## 2015-08-21 ENCOUNTER — Telehealth: Payer: Self-pay

## 2015-08-21 NOTE — Telephone Encounter (Signed)
FYI: Pt's daughter states that she is concerned about moms behavior. Pt's notices that her mother is emotions are inconsistant. She states that her mom can go from being very angry to loving in minutes.

## 2015-08-21 NOTE — Telephone Encounter (Signed)
i appreciate her concern , she should encourage her Mom to make an appointment

## 2015-08-22 ENCOUNTER — Other Ambulatory Visit: Payer: Self-pay

## 2015-08-22 MED ORDER — ROSUVASTATIN CALCIUM 10 MG PO TABS: 10.0000 mg | ORAL_TABLET | Freq: Every day | ORAL | Status: DC

## 2015-08-25 NOTE — Telephone Encounter (Signed)
Notified daughter

## 2015-08-27 ENCOUNTER — Other Ambulatory Visit: Payer: Self-pay | Admitting: Internal Medicine

## 2015-10-18 ENCOUNTER — Other Ambulatory Visit: Payer: Self-pay | Admitting: Internal Medicine

## 2015-11-01 ENCOUNTER — Telehealth: Payer: Self-pay

## 2015-11-01 NOTE — Telephone Encounter (Signed)
Patient is on the list for [employer] 2017 and may be a good candidate for an AWV in 2017. Please let me know if/when appt is scheduled.   Re: Due in October

## 2015-11-03 NOTE — Telephone Encounter (Signed)
Thank you.  Will continue to follow as appropriate.

## 2015-11-17 ENCOUNTER — Other Ambulatory Visit: Payer: Self-pay | Admitting: Internal Medicine

## 2015-12-09 NOTE — Telephone Encounter (Signed)
Patient will schedule when closer to due date.  Not due until 02/2016.

## 2015-12-15 ENCOUNTER — Other Ambulatory Visit: Payer: Self-pay | Admitting: Internal Medicine

## 2016-02-13 ENCOUNTER — Other Ambulatory Visit: Payer: Self-pay | Admitting: Internal Medicine

## 2016-03-16 ENCOUNTER — Other Ambulatory Visit: Payer: Self-pay | Admitting: Internal Medicine

## 2016-03-23 NOTE — Telephone Encounter (Signed)
Has the patient scheduled?

## 2016-03-24 NOTE — Telephone Encounter (Signed)
No.  Home number no longer working. Left a reminder message on cell number to call me and schedule at earliest convenience.

## 2016-04-20 ENCOUNTER — Other Ambulatory Visit: Payer: Self-pay | Admitting: Internal Medicine

## 2016-04-20 ENCOUNTER — Telehealth: Payer: Self-pay | Admitting: Internal Medicine

## 2016-04-20 DIAGNOSIS — Z1239 Encounter for other screening for malignant neoplasm of breast: Secondary | ICD-10-CM

## 2016-04-20 NOTE — Telephone Encounter (Signed)
Pt sent a Mychart message wanting to get a Mammo done. Needing a order. Please and thank you!

## 2016-04-21 NOTE — Telephone Encounter (Signed)
Left message to call office

## 2016-04-27 NOTE — Telephone Encounter (Signed)
order in system. Left patient detailed message .

## 2016-05-14 ENCOUNTER — Other Ambulatory Visit: Payer: Self-pay | Admitting: Internal Medicine

## 2016-06-02 ENCOUNTER — Ambulatory Visit (INDEPENDENT_AMBULATORY_CARE_PROVIDER_SITE_OTHER): Payer: Medicare Other | Admitting: Internal Medicine

## 2016-06-02 ENCOUNTER — Encounter: Payer: Self-pay | Admitting: Internal Medicine

## 2016-06-02 VITALS — BP 108/58 | HR 64 | Temp 97.6°F | Resp 16 | Ht 62.0 in | Wt 176.0 lb

## 2016-06-02 DIAGNOSIS — E785 Hyperlipidemia, unspecified: Secondary | ICD-10-CM | POA: Diagnosis not present

## 2016-06-02 DIAGNOSIS — Z6831 Body mass index (BMI) 31.0-31.9, adult: Secondary | ICD-10-CM

## 2016-06-02 DIAGNOSIS — I10 Essential (primary) hypertension: Secondary | ICD-10-CM | POA: Diagnosis not present

## 2016-06-02 DIAGNOSIS — M85859 Other specified disorders of bone density and structure, unspecified thigh: Secondary | ICD-10-CM

## 2016-06-02 DIAGNOSIS — E6609 Other obesity due to excess calories: Secondary | ICD-10-CM | POA: Diagnosis not present

## 2016-06-02 DIAGNOSIS — R5383 Other fatigue: Secondary | ICD-10-CM | POA: Diagnosis not present

## 2016-06-02 DIAGNOSIS — E559 Vitamin D deficiency, unspecified: Secondary | ICD-10-CM | POA: Diagnosis not present

## 2016-06-02 DIAGNOSIS — F330 Major depressive disorder, recurrent, mild: Secondary | ICD-10-CM

## 2016-06-02 LAB — COMPREHENSIVE METABOLIC PANEL
ALT: 18 U/L (ref 0–35)
AST: 18 U/L (ref 0–37)
Albumin: 4.5 g/dL (ref 3.5–5.2)
Alkaline Phosphatase: 80 U/L (ref 39–117)
BUN: 18 mg/dL (ref 6–23)
CO2: 30 mEq/L (ref 19–32)
Calcium: 9.8 mg/dL (ref 8.4–10.5)
Chloride: 109 mEq/L (ref 96–112)
Creatinine, Ser: 1.18 mg/dL (ref 0.40–1.20)
GFR: 48.42 mL/min — ABNORMAL LOW (ref >60.00)
Glucose, Bld: 87 mg/dL (ref 70–99)
Potassium: 4.6 mEq/L (ref 3.5–5.1)
Sodium: 145 mEq/L (ref 135–145)
Total Bilirubin: 1.4 mg/dL — ABNORMAL HIGH (ref 0.2–1.2)
Total Protein: 6.7 g/dL (ref 6.0–8.3)

## 2016-06-02 LAB — CBC WITH DIFFERENTIAL/PLATELET
Basophils Absolute: 0.1 10*3/uL (ref 0.0–0.1)
Basophils Relative: 0.9 % (ref 0.0–3.0)
Eosinophils Absolute: 0.2 10*3/uL (ref 0.0–0.7)
Eosinophils Relative: 3.7 % (ref 0.0–5.0)
HCT: 41.9 % (ref 36.0–46.0)
Hemoglobin: 14.2 g/dL (ref 12.0–15.0)
Lymphocytes Relative: 26.1 % (ref 12.0–46.0)
Lymphs Abs: 1.7 10*3/uL (ref 0.7–4.0)
MCHC: 34 g/dL (ref 30.0–36.0)
MCV: 92.3 fl (ref 78.0–100.0)
Monocytes Absolute: 0.6 10*3/uL (ref 0.1–1.0)
Monocytes Relative: 9.2 % (ref 3.0–12.0)
Neutro Abs: 3.9 10*3/uL (ref 1.4–7.7)
Neutrophils Relative %: 60.1 % (ref 43.0–77.0)
Platelets: 235 10*3/uL (ref 150.0–400.0)
RBC: 4.54 Mil/uL (ref 3.87–5.11)
RDW: 13.2 % (ref 11.5–15.5)
WBC: 6.6 10*3/uL (ref 4.0–10.5)

## 2016-06-02 LAB — TSH: TSH: 1.5 u[IU]/mL (ref 0.35–4.50)

## 2016-06-02 LAB — HEMOGLOBIN A1C: Hgb A1c MFr Bld: 5.7 % (ref 4.6–6.5)

## 2016-06-02 LAB — LIPID PANEL
Cholesterol: 168 mg/dL (ref 0–200)
HDL: 51.5 mg/dL (ref >39.00)
LDL Cholesterol: 95 mg/dL (ref 0–99)
NonHDL: 116.39
Total CHOL/HDL Ratio: 3
Triglycerides: 109 mg/dL (ref 0.0–149.0)
VLDL: 21.8 mg/dL (ref 0.0–40.0)

## 2016-06-02 LAB — VITAMIN D 25 HYDROXY (VIT D DEFICIENCY, FRACTURES): VITD: 44.78 ng/mL (ref 30.00–100.00)

## 2016-06-02 NOTE — Patient Instructions (Signed)
I will order the cologuard test for your screening  I recommend that you get the TDaP vaccine at [facility] OR [facility]   .  It is good for ten years for protection against tetanus,  And for lifetime protection against  diptheria and whooping cough     Health Maintenance for Postmenopausal Women Introduction Menopause is a normal process in which your reproductive ability comes to an end. This process happens gradually over a span of months to years, usually between the ages of 74 and 70. Menopause is complete when you have missed 12 consecutive menstrual periods. It is important to talk with your health care provider about some of the most common conditions that affect postmenopausal women, such as heart disease, cancer, and bone loss (osteoporosis). Adopting a healthy lifestyle and getting preventive care can help to promote your health and wellness. Those actions can also lower your chances of developing some of these common conditions. What should I know about menopause? During menopause, you may experience a number of symptoms, such as:  Moderate-to-severe hot flashes.  Night sweats.  Decrease in sex drive.  Mood swings.  Headaches.  Tiredness.  Irritability.  Memory problems.  Insomnia. Choosing to treat or not to treat menopausal changes is an individual decision that you make with your health care provider. What should I know about hormone replacement therapy and supplements? Hormone therapy products are effective for treating symptoms that are associated with menopause, such as hot flashes and night sweats. Hormone replacement carries certain risks, especially as you become older. If you are thinking about using estrogen or estrogen with progestin treatments, discuss the benefits and risks with your health care provider. What should I know about heart disease and stroke? Heart disease, heart attack, and stroke become more likely as you age. This may be due, in part, to the  hormonal changes that your body experiences during menopause. These can affect how your body processes dietary fats, triglycerides, and cholesterol. Heart attack and stroke are both medical emergencies. There are many things that you can do to help prevent heart disease and stroke:  Have your blood pressure checked at least every 1-2 years. High blood pressure causes heart disease and increases the risk of stroke.  If you are 25-41 years old, ask your health care provider if you should take aspirin to prevent a heart attack or a stroke.  Do not use any tobacco products, including cigarettes, chewing tobacco, or electronic cigarettes. If you need help quitting, ask your health care provider.  It is important to eat a healthy diet and maintain a healthy weight.  Be sure to include plenty of vegetables, fruits, low-fat dairy products, and lean protein.  Avoid eating foods that are high in solid fats, added sugars, or salt (sodium).  Get regular exercise. This is one of the most important things that you can do for your health.  Try to exercise for at least 150 minutes each week. The type of exercise that you do should increase your heart rate and make you sweat. This is known as moderate-intensity exercise.  Try to do strengthening exercises at least twice each week. Do these in addition to the moderate-intensity exercise.  Know your numbers.Ask your health care provider to check your cholesterol and your blood glucose. Continue to have your blood tested as directed by your health care provider. What should I know about cancer screening? There are several types of cancer. Take the following steps to reduce your risk and to catch  any cancer development as early as possible. Breast Cancer  Practice breast self-awareness.  This means understanding how your breasts normally appear and feel.  It also means doing regular breast self-exams. Let your health care provider know about any changes,  no matter how small.  If you are 46 or older, have a clinician do a breast exam (clinical breast exam or CBE) every year. Depending on your age, family history, and medical history, it may be recommended that you also have a yearly breast X-ray (mammogram).  If you have a family history of breast cancer, talk with your health care provider about genetic screening.  If you are at high risk for breast cancer, talk with your health care provider about having an MRI and a mammogram every year.  Breast cancer (BRCA) gene test is recommended for women who have family members with BRCA-related cancers. Results of the assessment will determine the need for genetic counseling and BRCA1 and for BRCA2 testing. BRCA-related cancers include these types:  Breast. This occurs in males or females.  Ovarian.  Tubal. This may also be called fallopian tube cancer.  Cancer of the abdominal or pelvic lining (peritoneal cancer).  Prostate.  Pancreatic. Cervical, Uterine, and Ovarian Cancer  Your health care provider may recommend that you be screened regularly for cancer of the pelvic organs. These include your ovaries, uterus, and vagina. This screening involves a pelvic exam, which includes checking for microscopic changes to the surface of your cervix (Pap test).  For women ages 21-65, health care providers may recommend a pelvic exam and a Pap test every three years. For women ages 28-65, they may recommend the Pap test and pelvic exam, combined with testing for human papilloma virus (HPV), every five years. Some types of HPV increase your risk of cervical cancer. Testing for HPV may also be done on women of any age who have unclear Pap test results.  Other health care providers may not recommend any screening for nonpregnant women who are considered low risk for pelvic cancer and have no symptoms. Ask your health care provider if a screening pelvic exam is right for you.  If you have had past treatment  for cervical cancer or a condition that could lead to cancer, you need Pap tests and screening for cancer for at least 20 years after your treatment. If Pap tests have been discontinued for you, your risk factors (such as having a new sexual partner) need to be reassessed to determine if you should start having screenings again. Some women have medical problems that increase the chance of getting cervical cancer. In these cases, your health care provider may recommend that you have screening and Pap tests more often.  If you have a family history of uterine cancer or ovarian cancer, talk with your health care provider about genetic screening.  If you have vaginal bleeding after reaching menopause, tell your health care provider.  There are currently no reliable tests available to screen for ovarian cancer. Lung Cancer  Lung cancer screening is recommended for adults 36-23 years old who are at high risk for lung cancer because of a history of smoking. A yearly low-dose CT scan of the lungs is recommended if you:  Currently smoke.  Have a history of at least 30 pack-years of smoking and you currently smoke or have quit within the past 15 years. A pack-year is smoking an average of one pack of cigarettes per day for one year. Yearly screening should:  Continue until  it has been 15 years since you quit.  Stop if you develop a health problem that would prevent you from having lung cancer treatment. Colorectal Cancer  This type of cancer can be detected and can often be prevented.  Routine colorectal cancer screening usually begins at age 78 and continues through age 88.  If you have risk factors for colon cancer, your health care provider may recommend that you be screened at an earlier age.  If you have a family history of colorectal cancer, talk with your health care provider about genetic screening.  Your health care provider may also recommend using home test kits to check for hidden blood  in your stool.  A small camera at the end of a tube can be used to examine your colon directly (sigmoidoscopy or colonoscopy). This is done to check for the earliest forms of colorectal cancer.  Direct examination of the colon should be repeated every 5-10 years until age 40. However, if early forms of precancerous polyps or small growths are found or if you have a family history or genetic risk for colorectal cancer, you may need to be screened more often. Skin Cancer  Check your skin from head to toe regularly.  Monitor any moles. Be sure to tell your health care provider:  About any new moles or changes in moles, especially if there is a change in a mole's shape or color.  If you have a mole that is larger than the size of a pencil eraser.  If any of your family members has a history of skin cancer, especially at a young age, talk with your health care provider about genetic screening.  Always use sunscreen. Apply sunscreen liberally and repeatedly throughout the day.  Whenever you are outside, protect yourself by wearing long sleeves, pants, a wide-brimmed hat, and sunglasses. What should I know about osteoporosis? Osteoporosis is a condition in which bone destruction happens more quickly than new bone creation. After menopause, you may be at an increased risk for osteoporosis. To help prevent osteoporosis or the bone fractures that can happen because of osteoporosis, the following is recommended:  If you are 21-15 years old, get at least 1,000 mg of calcium and at least 600 mg of vitamin D per day.  If you are older than age 18 but younger than age 8, get at least 1,200 mg of calcium and at least 600 mg of vitamin D per day.  If you are older than age 33, get at least 1,200 mg of calcium and at least 800 mg of vitamin D per day. Smoking and excessive alcohol intake increase the risk of osteoporosis. Eat foods that are rich in calcium and vitamin D, and do weight-bearing exercises  several times each week as directed by your health care provider. What should I know about how menopause affects my mental health? Depression may occur at any age, but it is more common as you become older. Common symptoms of depression include:  Low or sad mood.  Changes in sleep patterns.  Changes in appetite or eating patterns.  Feeling an overall lack of motivation or enjoyment of activities that you previously enjoyed.  Frequent crying spells. Talk with your health care provider if you think that you are experiencing depression. What should I know about immunizations? It is important that you get and maintain your immunizations. These include:  Tetanus, diphtheria, and pertussis (Tdap) booster vaccine.  Influenza every year before the flu season begins.  Pneumonia vaccine.  Shingles vaccine. Your health care provider may also recommend other immunizations. This information is not intended to replace advice given to you by your health care provider. Make sure you discuss any questions you have with your health care provider. Document Released: 07/09/2005 Document Revised: 12/05/2015 Document Reviewed: 02/18/2015  2017 Elsevier

## 2016-06-02 NOTE — Progress Notes (Signed)
Patient ID: Kathy Long, female    DOB: September 26, 1950  Age: 66 y.o. MRN: [id number]  The patient is here for annual physical examination and management of other chronic and acute problems.   OVERDUE FOR MAMMOGRAM,  LAST ONE 2015 , SET UP FOR January 10TH Due for colon Ca screening .  Discussed use of  cologuard vs colonoscopy  The risk factors are reflected in the social history.  The roster of all physicians providing medical care to patient - is listed in the Snapshot section of the chart.  Home safety : The patient has smoke detectors in the home. They wear seatbelts.  There are no firearms at home. There is no violence in the home.   There is no risks for hepatitis, STDs or HIV. There is no   history of blood transfusion. They have no travel history to infectious disease endemic areas of the world.  The patient has seen their dentist in the last six month.   They do not  have excessive sun exposure. Discussed the need for sun protection: hats, long sleeves and use of sunscreen if there is significant sun exposure.   Diet: the importance of a healthy diet is discussed. They do have a healthy diet.  The benefits of regular aerobic exercise were discussed. She is not exercising regularly . Marland Kitchen   Depression screen: there are no signs or vegative symptoms of depression- irritability, change in appetite, anhedonia, sadness/tearfullness.  The following portions of the patient's history were reviewed and updated as appropriate: allergies, current medications, past family history, past medical history,  past surgical history, past social history  and problem list.  Visual acuity was not assessed per patient preference since she has regular follow up with her ophthalmologist. Hearing and body mass index were assessed and reviewed.   During the course of the visit the patient was educated and counseled about appropriate screening and preventive services including : fall prevention , diabetes  screening, nutrition counseling, colorectal cancer screening, and recommended immunizations.    CC: The primary encounter diagnosis was Fatigue, unspecified type. Diagnoses of Essential hypertension, Class 1 obesity due to excess calories without serious comorbidity with body mass index (BMI) of 31.0 to 31.9 in adult, Hyperlipidemia LDL goal <130, Vitamin D deficiency, Osteopenia of thigh, unspecified laterality, and Mild episode of recurrent major depressive disorder ([place]) were also pertinent to this visit.  1) anxiety: she cites several family stressors have been affecting her mood and anxiety level:  1) Sister Kathy Long is clinically depressed, 2) son's marriage ended abruptly due to wife having an affair,  2 kids.     History [name] has a past medical history of Acoustic neuroma ([place]); Cancer ([place]); Depression; Hyperlipidemia; and Hypertension.   She has a past surgical history that includes Melanoma excision (Left, 2000); Appendectomy; Foot surgery; Back surgery; Tonsillectomy (N/A, 04/08/2015); and Palatoplasty (N/A, 04/08/2015).   Her family history includes Cancer (age of onset: 70) in her sister.She reports that she has never smoked. She has never used smokeless tobacco. She reports that she drinks alcohol. She reports that she does not use drugs.  Outpatient Medications Prior to Visit  Medication Sig Dispense Refill  . amLODipine (NORVASC) 5 MG tablet Take 1 tablet (5 mg total) by mouth daily. MUST KEEP APPT IN January FOR FURTHER REFILLS 30 tablet 0  . Cholecalciferol 1000 UNITS tablet Take 1,000 Units by mouth 2 (two) times daily.    . citalopram (CELEXA) 20 MG tablet TAKE ONE TABLET BY  MOUTH DAILY 90 tablet 0  . Melatonin 3 MG CAPS Take 3 mg by mouth at bedtime.    . metoprolol succinate (TOPROL-XL) 50 MG 24 hr tablet TAKE 1 TABLET BY MOUTH DAILY *NEED TO SCHEDULE OFFICE VISIT FOR FURTHER REFILL 90 tablet 0  . omeprazole (PRILOSEC) 40 MG capsule Take 1 capsule (40 mg total) by mouth 2 (two)  times daily. NEEDS TO SCHEDULE OFFICE VISIT FOR FURTHER REFILLS 60 capsule 0  . rosuvastatin (CRESTOR) 10 MG tablet Take 1 tablet (10 mg total) by mouth daily. 90 tablet 2  . DEXILANT 30 MG capsule Take 30 mg by mouth daily.     Marland Kitchen HYDROcodone-acetaminophen (HYCET) 7.5-325 mg/15 ml solution Take 15 mLs by mouth every 4 (four) hours as needed for moderate pain. 650 mL 0  . ondansetron (ZOFRAN) 8 MG tablet Take 1 tablet (8 mg total) by mouth every 8 (eight) hours as needed for nausea or vomiting. 30 tablet 0   No facility-administered medications prior to visit.     Review of Systems  Patient denies headache, fevers, malaise, unintentional weight loss, skin rash, eye pain, sinus congestion and sinus pain, sore throat, dysphagia,  hemoptysis , cough, dyspnea, wheezing, chest pain, palpitations, orthopnea, edema, abdominal pain, nausea, melena, diarrhea, constipation, flank pain, dysuria, hematuria, urinary  Frequency, nocturia, numbness, tingling, seizures,  Focal weakness, Loss of consciousness,  Tremor, insomnia, depression, and suicidal ideation.     Objective:  BP (!) 108/58   Pulse 64   Temp 97.6 F (36.4 C) (Oral)   Resp 16   Ht 5\' 2"  (1.575 m)   Wt 176 lb (79.8 kg)   LMP  (Approximate)   SpO2 97%   BMI 32.19 kg/m   Physical Exam   General appearance: alert, cooperative and appears stated age Head: Normocephalic, without obvious abnormality, atraumatic Eyes: conjunctivae/corneas clear. PERRL, EOM's intact. Fundi benign. Ears: normal TM's and external ear canals both ears Nose: Nares normal. Septum midline. Mucosa normal. No drainage or sinus tenderness. Throat: lips, mucosa, and tongue normal; teeth and gums normal Neck: no adenopathy, no carotid bruit, no JVD, supple, symmetrical, trachea midline and thyroid not enlarged, symmetric, no tenderness/mass/nodules Lungs: clear to auscultation bilaterally Breasts: normal appearance, no masses or tenderness Heart: regular rate and  rhythm, S1, S2 normal, no murmur, click, rub or gallop Abdomen: soft, non-tender; bowel sounds normal; no masses,  no organomegaly Extremities: extremities normal, atraumatic, no cyanosis or edema Pulses: 2+ and symmetric Skin: Skin color, texture, turgor normal. No rashes or lesions Neurologic: Alert and oriented X 3, normal strength and tone. Normal symmetric reflexes. Normal coordination and gait.     Assessment & Plan:   Problem List Items Addressed This Visit    Essential hypertension    Well controlled on current regimen of amlodipine and toprol. . Renal function stable, no changes today.  Lab Results  Component Value Date   CREATININE 1.14 09/25/2014         Hyperlipidemia LDL goal <130    LDL and triglycerides are at goal on  crestor and  myalgias did not recur after stopping Lipitor . FTS are normal   Lab Results  Component Value Date   CHOL 168 06/02/2016   HDL 51.50 06/02/2016   LDLCALC 95 06/02/2016   TRIG 109.0 06/02/2016   CHOLHDL 3 06/02/2016   Lab Results  Component Value Date   ALT 18 06/02/2016   AST 18 06/02/2016   ALKPHOS 80 06/02/2016   BILITOT 1.4 (H) 06/02/2016  Relevant Orders   Lipid panel (Completed)   Major depressive disorder, 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[place])    Symptoms resolved.  Continue citalopram for management of anxiety aggravated by family stressors.       Obesity    I have addressed  BMI and recommended wt loss of 10% of body weight over the next 6 months using a low fat, low starch, high protein  fruit/vegetable based Mediterranean diet and 30 minutes of aerobic exercise a minimum of 5 days per week.        Relevant Orders   Comprehensive metabolic panel (Completed)   Hemoglobin A1c (Completed)   Osteopenia    Moderate, improved by last dexa in 2014 done by prior PCP. No therapy pharmacotherapy recommended unless  t scores decline.        Other Visit Diagnoses    Fatigue, unspecified type    -  Primary   Relevant  Orders   TSH (Completed)   CBC with Differential/Platelet (Completed)   Hepatitis C antibody (Completed)   HIV antibody (Completed)   Vitamin D deficiency       Relevant Orders   VITAMIN D 25 Hydroxy (Vit-D Deficiency, Fractures) (Completed)      I have discontinued Ms. Schooler's DEXILANT, HYDROcodone-acetaminophen, and ondansetron. I am also having her maintain her Cholecalciferol, Melatonin, rosuvastatin, metoprolol succinate, citalopram, omeprazole, amLODipine, and DOCOSAHEXAENOIC ACID PO.  Meds ordered this encounter  Medications  . DOCOSAHEXAENOIC ACID PO    Sig: Take 300 mg by mouth 1 day or 1 dose.    Medications Discontinued During This Encounter  Medication Reason  . HYDROcodone-acetaminophen (HYCET) 7.5-325 mg/15 ml solution No longer needed (for PRN medications)  . ondansetron (ZOFRAN) 8 MG tablet No longer needed (for PRN medications)  . DEXILANT 30 MG capsule Discontinued by provider    Follow-up: Return in about 6 months (around 11/30/2016).   [name], MD

## 2016-06-02 NOTE — Assessment & Plan Note (Addendum)
Well controlled on current regimen of amlodipine and toprol. . Renal function stable, no changes today.  Lab Results  Component Value Date   CREATININE 1.14 09/25/2014

## 2016-06-02 NOTE — Progress Notes (Signed)
Pre-visit discussion using our clinic review tool. No additional management support is needed unless otherwise documented below in the visit note.  

## 2016-06-03 LAB — HIV ANTIBODY (ROUTINE TESTING W REFLEX): HIV 1&2 Ab, 4th Generation: NONREACTIVE

## 2016-06-03 LAB — HEPATITIS C ANTIBODY: HCV Ab: NEGATIVE

## 2016-06-05 DIAGNOSIS — F339 Major depressive disorder, recurrent, unspecified: Secondary | ICD-10-CM | POA: Insufficient documentation

## 2016-06-05 DIAGNOSIS — F329 Major depressive disorder, single episode, unspecified: Secondary | ICD-10-CM | POA: Insufficient documentation

## 2016-06-05 HISTORY — DX: Major depressive disorder, single episode, unspecified: F32.9

## 2016-06-05 NOTE — Assessment & Plan Note (Addendum)
LDL and triglycerides are at goal on  crestor and  myalgias did not recur after stopping Lipitor . FTS are normal   Lab Results  Component Value Date   CHOL 168 06/02/2016   HDL 51.50 06/02/2016   LDLCALC 95 06/02/2016   TRIG 109.0 06/02/2016   CHOLHDL 3 06/02/2016   Lab Results  Component Value Date   ALT 18 06/02/2016   AST 18 06/02/2016   ALKPHOS 80 06/02/2016   BILITOT 1.4 (H) 06/02/2016

## 2016-06-05 NOTE — Assessment & Plan Note (Signed)
Symptoms resolved.  Continue citalopram for management of anxiety aggravated by family stressors.

## 2016-06-05 NOTE — Assessment & Plan Note (Signed)
Moderate, improved by last dexa in 2014 done by prior PCP. No therapy pharmacotherapy recommended unless  t scores decline.

## 2016-06-05 NOTE — Assessment & Plan Note (Signed)
I have addressed  BMI and recommended wt loss of 10% of body weight over the next 6 months using a low fat, low starch, high protein  fruit/vegetable based Mediterranean diet and 30 minutes of aerobic exercise a minimum of 5 days per week.   

## 2016-06-06 ENCOUNTER — Encounter: Payer: Self-pay | Admitting: Internal Medicine

## 2016-06-09 ENCOUNTER — Ambulatory Visit
Admission: RE | Admit: 2016-06-09 | Discharge: 2016-06-09 | Disposition: A | Discharge status: Home / Self Care | Payer: Medicare Other | Source: Ambulatory Visit | Attending: Internal Medicine | Admitting: Internal Medicine

## 2016-06-09 DIAGNOSIS — Z1231 Encounter for screening mammogram for malignant neoplasm of breast: Secondary | ICD-10-CM | POA: Diagnosis not present

## 2016-06-09 DIAGNOSIS — Z1239 Encounter for other screening for malignant neoplasm of breast: Secondary | ICD-10-CM

## 2016-06-15 ENCOUNTER — Other Ambulatory Visit: Payer: Self-pay | Admitting: Internal Medicine

## 2016-06-24 ENCOUNTER — Telehealth: Payer: Self-pay | Admitting: *Deleted

## 2016-06-24 ENCOUNTER — Telehealth: Payer: Self-pay | Admitting: Internal Medicine

## 2016-06-24 NOTE — Telephone Encounter (Signed)
Pt has  dizziness, nausea and vomiting. Pt can not stand,due to dizziness.  Pt was transferred to [place] [phone]

## 2016-06-24 NOTE — Telephone Encounter (Signed)
fyi

## 2016-06-24 NOTE — Telephone Encounter (Signed)
[facility] Patient Name: [facility] Deandrade DOB: [birth year] Initial Comment Caller states wife having extreme dizziness and can't hardly stand, vomiting Nurse Assessment Nurse: Hardin Negus, RN, Estill Bamberg Date/Time (Eastern Time): 06/24/2016 1:11:12 PM Confirm and document reason for call. If symptomatic, describe symptoms. ---Caller states wife having extreme dizziness and can't hardly stand, vomiting. Denies all other symptoms. Caller states this all started at 5:30am. Does the patient have any new or worsening symptoms? ---Yes Will a triage be completed? ---Yes Related visit to physician within the last 2 weeks? ---No Does the PT have any chronic conditions? (i.e. diabetes, asthma, etc.) ---No Is this a behavioral health or substance abuse call? ---No Guidelines Guideline Title Affirmed Question Affirmed Notes Dizziness - Vertigo SEVERE dizziness (vertigo) (e.g., unable to walk without assistance) Final Disposition User Go to ED Now (or PCP triage) Hardin Negus, RN, Estill Bamberg Comments CBWN: Caller calling back through the answering service to try and get his wife some help. Referrals [facility] - ED Disagree/Comply: Comply

## 2016-06-24 NOTE — Telephone Encounter (Signed)
Left message for AP to follow up regarding patient

## 2016-06-24 NOTE — Telephone Encounter (Signed)
Patient going to [facility] ER will follow

## 2016-06-25 MED ORDER — PROMETHAZINE HCL 12.5 MG PO TABS: 12.5000 mg | ORAL_TABLET | Freq: Three times a day (TID) | ORAL | 0 refills | Status: DC | PRN

## 2016-06-25 MED ORDER — PREDNISONE 10 MG PO TABS: ORAL_TABLET | ORAL | 0 refills | Status: DC

## 2016-06-25 NOTE — Telephone Encounter (Signed)
Patient husband still refused ER visit , he will pick up medications.

## 2016-06-25 NOTE — Telephone Encounter (Signed)
I will call in some phenergan and a prednisone taper to her pharmacy to start ASAP for her vertigo but I cannot be certain this is not a stroke,; that is why going to the Er was advised.

## 2016-06-25 NOTE — Telephone Encounter (Signed)
Spoke with DPR AP husband patient is scheduled to see ENT , Dr Tami Ribas on Monday.  The RN at ENT instructed DPR if patient continues to have nausea , vomitting patient should go to Er for evaluation prior to Monday.  DPR reported patient is able to keep liquids down taking Dramamine.  Patient is still dizzy but assisting when getting out of bed and with activity.

## 2016-06-25 NOTE — Telephone Encounter (Signed)
FYI

## 2016-06-25 NOTE — Telephone Encounter (Signed)
Followed up with patient because no record of being seen at ER . Patient awoke yesterday became nauseated, and then became very dizzy " room spinning"  patient went to bed and vomited several times per DPR, patient still in bed today but nausea or vomiting , still dizzy upon rising. DPR trying to talk with ENT to get patient in office today.

## 2016-06-29 ENCOUNTER — Other Ambulatory Visit: Payer: Self-pay | Admitting: Unknown Physician Specialty

## 2016-06-29 DIAGNOSIS — D333 Benign neoplasm of cranial nerves: Secondary | ICD-10-CM

## 2016-07-23 ENCOUNTER — Encounter: Payer: Self-pay | Admitting: Internal Medicine

## 2016-08-17 LAB — COLOGUARD: Cologuard: NEGATIVE

## 2016-08-22 ENCOUNTER — Telehealth: Payer: Self-pay | Admitting: Internal Medicine

## 2016-08-22 NOTE — Telephone Encounter (Signed)
Please abstract,  The negative cologuard

## 2016-08-24 NOTE — Telephone Encounter (Signed)
LMTCB

## 2016-08-26 NOTE — Telephone Encounter (Signed)
Abstracted

## 2016-09-01 ENCOUNTER — Encounter: Payer: Self-pay | Admitting: Internal Medicine

## 2016-09-08 ENCOUNTER — Other Ambulatory Visit: Payer: Self-pay | Admitting: Internal Medicine

## 2016-12-03 ENCOUNTER — Ambulatory Visit: Payer: Medicare Other

## 2016-12-07 ENCOUNTER — Ambulatory Visit (INDEPENDENT_AMBULATORY_CARE_PROVIDER_SITE_OTHER): Payer: Medicare Other | Admitting: Internal Medicine

## 2016-12-07 ENCOUNTER — Encounter: Payer: Self-pay | Admitting: Internal Medicine

## 2016-12-07 ENCOUNTER — Other Ambulatory Visit: Payer: Self-pay | Admitting: Internal Medicine

## 2016-12-07 VITALS — BP 118/66 | HR 50 | Temp 97.8°F | Resp 15 | Ht 62.0 in | Wt 172.0 lb

## 2016-12-07 DIAGNOSIS — Z79899 Other long term (current) drug therapy: Secondary | ICD-10-CM

## 2016-12-07 DIAGNOSIS — R351 Nocturia: Secondary | ICD-10-CM

## 2016-12-07 DIAGNOSIS — I10 Essential (primary) hypertension: Secondary | ICD-10-CM

## 2016-12-07 DIAGNOSIS — E785 Hyperlipidemia, unspecified: Secondary | ICD-10-CM | POA: Diagnosis not present

## 2016-12-07 DIAGNOSIS — F330 Major depressive disorder, recurrent, mild: Secondary | ICD-10-CM | POA: Diagnosis not present

## 2016-12-07 MED ORDER — SOLIFENACIN SUCCINATE 5 MG PO TABS: 5.0000 mg | ORAL_TABLET | Freq: Every day | ORAL | 0 refills | Status: DC

## 2016-12-07 MED ORDER — ZOSTER VAC RECOMB ADJUVANTED 50 MCG/0.5ML IM SUSR: 0.5000 mL | Freq: Once | INTRAMUSCULAR | 1 refills | Status: AC

## 2016-12-07 NOTE — Patient Instructions (Addendum)
Trial of vesicare 5mg  daily before bedtime for urinary infrequency   Turn your cell phones and lap tops off 2 hours before bedtime     The ShingRx vaccine is now available in local pharmacies and is much more protective thant Zostavaxs,  It is therefore ADVISED for all interested adults over 50 to prevent shingles

## 2016-12-07 NOTE — Progress Notes (Signed)
Subjective:  Patient ID: Kathy Long, female    DOB: [birth year]  Age: 66 y.o. MRN: [id number]  CC: The primary encounter diagnosis was Long-term use of high-risk medication. Diagnoses of Mild episode of recurrent major depressive disorder ([place]), Hyperlipidemia LDL goal <130, Essential hypertension, and Nocturia were also pertinent to this visit.  HPI Kathy Long presents for follow up on GAD managed with citalopram , obesity with hypertension and hyperlipidemia.  Her anxity continus to be an issues,  And one of the sources of her anxiety is the emotional status of her son  Kathy Long. Who was devastated by his wife leaving him.  Fortunately her son  Kathy Long has remarried last week,  Lots of family drama  Due to the divorce and custody issues  Chronic insomnia  due to mild racing thoughts at night , takes melatonin. Still has a sleepless night once or twice a week. Prior zanax 0.25 mg made her very sedated all next day,  Has nocturia up to 6 times daily. No prior treatment.    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   Obesity:  Not exercising regularly.       Lab Results  Component Value Date   HGBA1C 5.7 06/02/2016     cologaurd negative March 2018    Outpatient Medications Prior to Visit  Medication Sig Dispense Refill  . amLODipine (NORVASC) 5 MG tablet Take 1 tablet (5 mg total) by mouth daily. MUST KEEP APPT IN January FOR FURTHER REFILLS 30 tablet 0  . Cholecalciferol 1000 UNITS tablet Take 1,000 Units by mouth 2 (two) times daily.    . citalopram (CELEXA) 20 MG tablet TAKE ONE TABLET EVERY DAY 90 tablet 1  . Melatonin 3 MG CAPS Take 3 mg by mouth at bedtime.    . metoprolol succinate (TOPROL-XL) 50 MG 24 hr tablet TAKE 1 TABLET BY MOUTH DAILY *NEED TO SCHEDULE OFFICE VISIT FOR FURTHER REFILLS* 90 tablet 2  . omeprazole (PRILOSEC) 40 MG  capsule TAKE 1 CAPSULE TWICE DAILY NEEDS APPT FOR FURTHER REFILLS 60 capsule 3  . rosuvastatin (CRESTOR) 10 MG tablet TAKE ONE TABLET POM EVERY DAY 90 tablet 2  . amLODipine (NORVASC) 5 MG tablet TAKE ONE TABLET EVERY DAY (Patient not taking: Reported on 12/07/2016) 90 tablet 1  . DOCOSAHEXAENOIC ACID PO Take 300 mg by mouth 1 day or 1 dose.    . predniSONE (DELTASONE) 10 MG tablet 6 tablets on Day 1 , then reduce by 1 tablet daily until gone (Patient not taking: Reported on 12/07/2016) 21 tablet 0  . promethazine (PHENERGAN) 12.5 MG tablet Take 1 tablet (12.5 mg total) by mouth every 8 (eight) hours as needed for nausea or vomiting. (Patient not taking: Reported on 12/07/2016) 20 tablet 0   No facility-administered medications prior to visit.     Review of Systems;  Patient denies headache, fevers, malaise, unintentional weight loss, skin rash, eye pain, sinus congestion and sinus pain, sore throat, dysphagia,  hemoptysis , cough, dyspnea, wheezing, chest pain, palpitations, orthopnea, edema, abdominal pain, nausea, melena, diarrhea, constipation, flank pain, dysuria, hematuria, urinary  Frequency, nocturia, numbness, tingling, seizures,  Focal weakness, Loss of consciousness,  Tremor, insomnia, depression, anxiety, and suicidal ideation.      Objective:  BP 118/66 (BP Location: Left Arm, Patient Position: Sitting, Cuff Size: Normal)  Pulse (!) 50   Temp 97.8 F (36.6 C) (Oral)   Resp 15   Ht 5\' 2"  (1.575 m)   Wt 172 lb (78 kg)   SpO2 95%   BMI 31.46 kg/m   BP Readings from Last 3 Encounters:  12/07/16 118/66  06/02/16 (!) 108/58  04/08/15 (!) 159/81    Wt Readings from Last 3 Encounters:  12/07/16 172 lb (78 kg)  06/02/16 176 lb (79.8 kg)  03/26/15 166 lb (75.3 kg)    General appearance: alert, cooperative and appears stated age Ears: normal TM's and external ear canals both ears Throat: lips, mucosa, and tongue normal; teeth and gums normal Neck: no adenopathy, no carotid  bruit, supple, symmetrical, trachea midline and thyroid not enlarged, symmetric, no tenderness/mass/nodules Back: symmetric, no curvature. ROM normal. No CVA tenderness. Lungs: clear to auscultation bilaterally Heart: regular rate and rhythm, S1, S2 normal, no murmur, click, rub or gallop Abdomen: soft, non-tender; bowel sounds normal; no masses,  no organomegaly Pulses: 2+ and symmetric Skin: Skin color, texture, turgor normal. No rashes or lesions Lymph nodes: Cervical, supraclavicular, and axillary nodes normal.  Lab Results  Component Value Date   HGBA1C 5.7 06/02/2016    Lab Results  Component Value Date   CREATININE 1.18 06/02/2016   CREATININE 1.14 09/25/2014   CREATININE 1.1 01/28/2014    Lab Results  Component Value Date   WBC 6.6 06/02/2016   HGB 14.2 06/02/2016   HCT 41.9 06/02/2016   PLT 235.0 06/02/2016   GLUCOSE 87 06/02/2016   CHOL 168 06/02/2016   TRIG 109.0 06/02/2016   HDL 51.50 06/02/2016   LDLCALC 95 06/02/2016   ALT 18 06/02/2016   AST 18 06/02/2016   NA 145 06/02/2016   K 4.6 06/02/2016   CL 109 06/02/2016   CREATININE 1.18 06/02/2016   BUN 18 06/02/2016   CO2 30 06/02/2016   TSH 1.50 06/02/2016   INR 0.91 10/23/2009   HGBA1C 5.7 06/02/2016    Mm Screening Breast Tomo Bilateral  Result Date: 06/09/2016 CLINICAL DATA:  Screening. EXAM: 2D DIGITAL SCREENING BILATERAL MAMMOGRAM WITH CAD AND ADJUNCT TOMO COMPARISON:  Previous exam(s). ACR Breast Density Category c: The breast tissue is heterogeneously dense, which may obscure small masses. FINDINGS: There are no findings suspicious for malignancy. Images were processed with CAD. IMPRESSION: No mammographic evidence of malignancy. A result letter of this screening mammogram will be mailed directly to the patient. RECOMMENDATION: Screening mammogram in one year. (Code:SM-B-01Y) BI-RADS CATEGORY  1: Negative. Electronically Signed   By: Dorise Bullion III M.D   On: 06/09/2016 14:57    Assessment &  Plan:   Problem List Items Addressed This Visit    Hyperlipidemia LDL goal <130    LDL and triglycerides are at goal on  crestor and  myalgias did not recur after stopping Lipitor . lFTS are DUE  Lab Results  Component Value Date   ALT 18 06/02/2016   AST 18 06/02/2016   ALKPHOS 80 06/02/2016   BILITOT 1.4 (H) 06/02/2016         Essential hypertension    Well controlled on current regimen of amlodipine and toprol. . Renal function IS DUE, , no changes today.  Lab Results  Component Value Date   CREATININE 1.18 06/02/2016         Major depressive disorder, 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[place])    Depressive Symptoms have resolved, but she prefers to continue citalopram for management of anxiety aggravated by family stressors.  Nocturia    Chronic,  Trial of vesicare 5 mg        Other Visit Diagnoses    Long-term use of high-risk medication    -  Primary   Relevant Orders   Comprehensive metabolic panel      I have discontinued Ms. Belizaire's DOCOSAHEXAENOIC ACID PO, predniSONE, and promethazine. I am also having her start on solifenacin and Zoster Vac Recomb Adjuvanted. Additionally, I am having her maintain her Cholecalciferol, Melatonin, amLODipine, rosuvastatin, metoprolol succinate, omeprazole, and citalopram.  Meds ordered this encounter  Medications  . solifenacin (VESICARE) 5 MG tablet    Sig: Take 1 tablet (5 mg total) by mouth daily.    Dispense:  30 tablet    Refill:  0  . Zoster Vac Recomb Adjuvanted (SHINGRIX) injection    Sig: Inject 0.5 mLs into the muscle once. Repeat dose in 30 to 180 days    Dispense:  0.5 mL    Refill:  1   A total of 25 minutes of face to face time was spent with patient more than half of which was spent in counselling about the above mentioned conditions  and coordination of care  Medications Discontinued During This Encounter  Medication Reason  . amLODipine (NORVASC) 5 MG tablet Duplicate  . DOCOSAHEXAENOIC ACID PO Patient has not  taken in last 30 days  . predniSONE (DELTASONE) 10 MG tablet Patient has not taken in last 30 days  . promethazine (PHENERGAN) 12.5 MG tablet Patient has not taken in last 30 days    Follow-up: Return in about 6 months (around 06/09/2017) for CPE.   [name], MD

## 2016-12-09 DIAGNOSIS — R351 Nocturia: Secondary | ICD-10-CM

## 2016-12-09 HISTORY — DX: Nocturia: R35.1

## 2016-12-09 NOTE — Assessment & Plan Note (Signed)
Chronic,  Trial of vesicare 5 mg

## 2016-12-09 NOTE — Assessment & Plan Note (Signed)
Depressive Symptoms have resolved, but she prefers to continue citalopram for management of anxiety aggravated by family stressors.

## 2016-12-09 NOTE — Assessment & Plan Note (Signed)
Well controlled on current regimen of amlodipine and toprol. . Renal function IS DUE, , no changes today.  Lab Results  Component Value Date   CREATININE 1.18 06/02/2016

## 2016-12-09 NOTE — Assessment & Plan Note (Signed)
LDL and triglycerides are at goal on  crestor and  myalgias did not recur after stopping Lipitor . lFTS are DUE  Lab Results  Component Value Date   ALT 18 06/02/2016   AST 18 06/02/2016   ALKPHOS 80 06/02/2016   BILITOT 1.4 (H) 06/02/2016

## 2016-12-14 ENCOUNTER — Other Ambulatory Visit: Payer: Self-pay | Admitting: Internal Medicine

## 2017-02-02 ENCOUNTER — Encounter: Payer: Self-pay | Admitting: Internal Medicine

## 2017-02-04 ENCOUNTER — Telehealth: Payer: Self-pay

## 2017-02-04 NOTE — Telephone Encounter (Signed)
Received a rx request for clobetasol ointment or cream from [facility]. Do not see this in the pt's chart nor in the historical med list. Is it okay to refill?

## 2017-02-04 NOTE — Telephone Encounter (Signed)
Yes ok to refill 

## 2017-02-06 MED ORDER — MIRABEGRON ER 25 MG PO TB24: 25.0000 mg | ORAL_TABLET | Freq: Every day | ORAL | 5 refills | Status: DC

## 2017-02-07 ENCOUNTER — Other Ambulatory Visit: Payer: Self-pay | Admitting: Internal Medicine

## 2017-02-08 MED ORDER — CLOBETASOL PROPIONATE 0.05 % EX SOLN: 1.0000 "application " | Freq: Every day | CUTANEOUS | 0 refills | Status: DC

## 2017-02-08 NOTE — Telephone Encounter (Signed)
refilled 

## 2017-02-08 NOTE — Addendum Note (Signed)
Addended by: Adair Laundry on: 02/08/2017 08:55 AM   Modules accepted: Orders

## 2017-03-04 ENCOUNTER — Encounter: Payer: Self-pay | Admitting: Internal Medicine

## 2017-03-07 ENCOUNTER — Other Ambulatory Visit: Payer: Self-pay | Admitting: Internal Medicine

## 2017-03-08 ENCOUNTER — Telehealth: Payer: Self-pay | Admitting: Internal Medicine

## 2017-03-08 DIAGNOSIS — Z1211 Encounter for screening for malignant neoplasm of colon: Secondary | ICD-10-CM | POA: Insufficient documentation

## 2017-03-10 NOTE — Telephone Encounter (Signed)
Error

## 2017-06-10 ENCOUNTER — Encounter: Payer: Self-pay | Admitting: Internal Medicine

## 2017-06-10 ENCOUNTER — Ambulatory Visit (INDEPENDENT_AMBULATORY_CARE_PROVIDER_SITE_OTHER): Payer: Medicare Other | Admitting: Internal Medicine

## 2017-06-10 VITALS — BP 122/84 | HR 48 | Temp 97.5°F | Resp 14 | Ht 61.75 in | Wt 167.0 lb

## 2017-06-10 DIAGNOSIS — E7849 Other hyperlipidemia: Secondary | ICD-10-CM

## 2017-06-10 DIAGNOSIS — Z1239 Encounter for other screening for malignant neoplasm of breast: Secondary | ICD-10-CM

## 2017-06-10 DIAGNOSIS — E6609 Other obesity due to excess calories: Secondary | ICD-10-CM | POA: Diagnosis not present

## 2017-06-10 DIAGNOSIS — Z0000 Encounter for general adult medical examination without abnormal findings: Secondary | ICD-10-CM

## 2017-06-10 DIAGNOSIS — D333 Benign neoplasm of cranial nerves: Secondary | ICD-10-CM | POA: Diagnosis not present

## 2017-06-10 DIAGNOSIS — Z79899 Other long term (current) drug therapy: Secondary | ICD-10-CM

## 2017-06-10 DIAGNOSIS — Z23 Encounter for immunization: Secondary | ICD-10-CM | POA: Diagnosis not present

## 2017-06-10 DIAGNOSIS — K432 Incisional hernia without obstruction or gangrene: Secondary | ICD-10-CM | POA: Diagnosis not present

## 2017-06-10 DIAGNOSIS — E66811 Obesity, class 1: Secondary | ICD-10-CM

## 2017-06-10 DIAGNOSIS — R7301 Impaired fasting glucose: Secondary | ICD-10-CM

## 2017-06-10 DIAGNOSIS — Z6830 Body mass index (BMI) 30.0-30.9, adult: Secondary | ICD-10-CM | POA: Diagnosis not present

## 2017-06-10 LAB — COMPREHENSIVE METABOLIC PANEL
ALT: 17 U/L (ref 0–35)
AST: 16 U/L (ref 0–37)
Albumin: 4.4 g/dL (ref 3.5–5.2)
Alkaline Phosphatase: 85 U/L (ref 39–117)
BUN: 21 mg/dL (ref 6–23)
CO2: 30 mEq/L (ref 19–32)
Calcium: 9.8 mg/dL (ref 8.4–10.5)
Chloride: 106 mEq/L (ref 96–112)
Creatinine, Ser: 1.27 mg/dL — ABNORMAL HIGH (ref 0.40–1.20)
GFR: 44.35 mL/min — ABNORMAL LOW (ref >60.00)
Glucose, Bld: 95 mg/dL (ref 70–99)
Potassium: 5 mEq/L (ref 3.5–5.1)
Sodium: 141 mEq/L (ref 135–145)
Total Bilirubin: 1.4 mg/dL — ABNORMAL HIGH (ref 0.2–1.2)
Total Protein: 7 g/dL (ref 6.0–8.3)

## 2017-06-10 LAB — LIPID PANEL
Cholesterol: 154 mg/dL (ref 0–200)
HDL: 49 mg/dL (ref >39.00)
LDL Cholesterol: 89 mg/dL (ref 0–99)
NonHDL: 105.23
Total CHOL/HDL Ratio: 3
Triglycerides: 79 mg/dL (ref 0.0–149.0)
VLDL: 15.8 mg/dL (ref 0.0–40.0)

## 2017-06-10 LAB — VITAMIN B12: Vitamin B-12: >1500 pg/mL — ABNORMAL HIGH (ref 211–911)

## 2017-06-10 LAB — TSH: TSH: 1.5 u[IU]/mL (ref 0.35–4.50)

## 2017-06-10 LAB — HEMOGLOBIN A1C: Hgb A1c MFr Bld: 5.8 % (ref 4.6–6.5)

## 2017-06-10 NOTE — Patient Instructions (Addendum)
I am referring you to Kathy Long to evaluate your incisional hernia  Congratulations on  the 11 lb weight loss!!    Health Maintenance for Postmenopausal Women Menopause is a normal process in which your reproductive ability comes to an end. This process happens gradually over a span of months to years, usually between the ages of 47 and 56. Menopause is complete when you have missed 12 consecutive menstrual periods. It is important to talk with your health care provider about some of the most common conditions that affect postmenopausal women, such as heart disease, cancer, and bone loss (osteoporosis). Adopting a healthy lifestyle and getting preventive care can help to promote your health and wellness. Those actions can also lower your chances of developing some of these common conditions. What should I know about menopause? During menopause, you may experience a number of symptoms, such as:  Moderate-to-severe hot flashes.  Night sweats.  Decrease in sex drive.  Mood swings.  Headaches.  Tiredness.  Irritability.  Memory problems.  Insomnia.  Choosing to treat or not to treat menopausal changes is an individual decision that you make with your health care provider. What should I know about hormone replacement therapy and supplements? Hormone therapy products are effective for treating symptoms that are associated with menopause, such as hot flashes and night sweats. Hormone replacement carries certain risks, especially as you become older. If you are thinking about using estrogen or estrogen with progestin treatments, discuss the benefits and risks with your health care provider. What should I know about heart disease and stroke? Heart disease, heart attack, and stroke become more likely as you age. This may be due, in part, to the hormonal changes that your body experiences during menopause. These can affect how your body processes dietary fats, triglycerides, and  cholesterol. Heart attack and stroke are both medical emergencies. There are many things that you can do to help prevent heart disease and stroke:  Have your blood pressure checked at least every 1-2 years. High blood pressure causes heart disease and increases the risk of stroke.  If you are 33-21 years old, ask your health care provider if you should take aspirin to prevent a heart attack or a stroke.  Do not use any tobacco products, including cigarettes, chewing tobacco, or electronic cigarettes. If you need help quitting, ask your health care provider.  It is important to eat a healthy diet and maintain a healthy weight. ? Be sure to include plenty of vegetables, fruits, low-fat dairy products, and lean protein. ? Avoid eating foods that are high in solid fats, added sugars, or salt (sodium).  Get regular exercise. This is one of the most important things that you can do for your health. ? Try to exercise for at least 150 minutes each week. The type of exercise that you do should increase your heart rate and make you sweat. This is known as moderate-intensity exercise. ? Try to do strengthening exercises at least twice each week. Do these in addition to the moderate-intensity exercise.  Know your numbers.Ask your health care provider to check your cholesterol and your blood glucose. Continue to have your blood tested as directed by your health care provider.  What should I know about cancer screening? There are several types of cancer. Take the following steps to reduce your risk and to catch any cancer development as early as possible. Breast Cancer  Practice breast self-awareness. ? This means understanding how your breasts normally appear and feel. ? It  also means doing regular breast self-exams. Let your health care provider know about any changes, no matter how small.  If you are 65 or older, have a clinician do a breast exam (clinical breast exam or CBE) every year. Depending  on your age, family history, and medical history, it may be recommended that you also have a yearly breast X-ray (mammogram).  If you have a family history of breast cancer, talk with your health care provider about genetic screening.  If you are at high risk for breast cancer, talk with your health care provider about having an MRI and a mammogram every year.  Breast cancer (BRCA) gene test is recommended for women who have family members with BRCA-related cancers. Results of the assessment will determine the need for genetic counseling and BRCA1 and for BRCA2 testing. BRCA-related cancers include these types: ? Breast. This occurs in males or females. ? Ovarian. ? Tubal. This may also be called fallopian tube cancer. ? Cancer of the abdominal or pelvic lining (peritoneal cancer). ? Prostate. ? Pancreatic.  Cervical, Uterine, and Ovarian Cancer Your health care provider may recommend that you be screened regularly for cancer of the pelvic organs. These include your ovaries, uterus, and vagina. This screening involves a pelvic exam, which includes checking for microscopic changes to the surface of your cervix (Pap test).  For women ages 21-65, health care providers may recommend a pelvic exam and a Pap test every three years. For women ages 45-65, they may recommend the Pap test and pelvic exam, combined with testing for human papilloma virus (HPV), every five years. Some types of HPV increase your risk of cervical cancer. Testing for HPV may also be done on women of any age who have unclear Pap test results.  Other health care providers may not recommend any screening for nonpregnant women who are considered low risk for pelvic cancer and have no symptoms. Ask your health care provider if a screening pelvic exam is right for you.  If you have had past treatment for cervical cancer or a condition that could lead to cancer, you need Pap tests and screening for cancer for at least 20 years after  your treatment. If Pap tests have been discontinued for you, your risk factors (such as having a new sexual partner) need to be reassessed to determine if you should start having screenings again. Some women have medical problems that increase the chance of getting cervical cancer. In these cases, your health care provider may recommend that you have screening and Pap tests more often.  If you have a family history of uterine cancer or ovarian cancer, talk with your health care provider about genetic screening.  If you have vaginal bleeding after reaching menopause, tell your health care provider.  There are currently no reliable tests available to screen for ovarian cancer.  Lung Cancer Lung cancer screening is recommended for adults 50-28 years old who are at high risk for lung cancer because of a history of smoking. A yearly low-dose CT scan of the lungs is recommended if you:  Currently smoke.  Have a history of at least 30 pack-years of smoking and you currently smoke or have quit within the past 15 years. A pack-year is smoking an average of one pack of cigarettes per day for one year.  Yearly screening should:  Continue until it has been 15 years since you quit.  Stop if you develop a health problem that would prevent you from having lung cancer treatment.  Colorectal Cancer  This type of cancer can be detected and can often be prevented.  Routine colorectal cancer screening usually begins at age 50 and continues through age 21.  If you have risk factors for colon cancer, your health care provider may recommend that you be screened at an earlier age.  If you have a family history of colorectal cancer, talk with your health care provider about genetic screening.  Your health care provider may also recommend using home test kits to check for hidden blood in your stool.  A small camera at the end of a tube can be used to examine your colon directly (sigmoidoscopy or colonoscopy).  This is done to check for the earliest forms of colorectal cancer.  Direct examination of the colon should be repeated every 5-10 years until age 59. However, if early forms of precancerous polyps or small growths are found or if you have a family history or genetic risk for colorectal cancer, you may need to be screened more often.  Skin Cancer  Check your skin from head to toe regularly.  Monitor any moles. Be sure to tell your health care provider: ? About any new moles or changes in moles, especially if there is a change in a mole's shape or color. ? If you have a mole that is larger than the size of a pencil eraser.  If any of your family members has a history of skin cancer, especially at a young age, talk with your health care provider about genetic screening.  Always use sunscreen. Apply sunscreen liberally and repeatedly throughout the day.  Whenever you are outside, protect yourself by wearing long sleeves, pants, a wide-brimmed hat, and sunglasses.  What should I know about osteoporosis? Osteoporosis is a condition in which bone destruction happens more quickly than new bone creation. After menopause, you may be at an increased risk for osteoporosis. To help prevent osteoporosis or the bone fractures that can happen because of osteoporosis, the following is recommended:  If you are 64-55 years old, get at least 1,000 mg of calcium and at least 600 mg of vitamin D per day.  If you are older than age 32 but younger than age 30, get at least 1,200 mg of calcium and at least 600 mg of vitamin D per day.  If you are older than age 88, get at least 1,200 mg of calcium and at least 800 mg of vitamin D per day.  Smoking and excessive alcohol intake increase the risk of osteoporosis. Eat foods that are rich in calcium and vitamin D, and do weight-bearing exercises several times each week as directed by your health care provider. What should I know about how menopause affects my mental  health? Depression may occur at any age, but it is more common as you become older. Common symptoms of depression include:  Low or sad mood.  Changes in sleep patterns.  Changes in appetite or eating patterns.  Feeling an overall lack of motivation or enjoyment of activities that you previously enjoyed.  Frequent crying spells.  Talk with your health care provider if you think that you are experiencing depression. What should I know about immunizations? It is important that you get and maintain your immunizations. These include:  Tetanus, diphtheria, and pertussis (Tdap) booster vaccine.  Influenza every year before the flu season begins.  Pneumonia vaccine.  Shingles vaccine.  Your health care provider may also recommend other immunizations. This information is not intended to replace advice given  to you by your health care provider. Make sure you discuss any questions you have with your health care provider. Document Released: 07/09/2005 Document Revised: 12/05/2015 Document Reviewed: 02/18/2015 Elsevier Interactive Patient Education  2018 [employer].

## 2017-06-10 NOTE — Progress Notes (Signed)
Patient ID: Kathy Long, female    DOB: 1951/01/16  Age: 67 y.o. MRN: [id number]  The patient is here for annual preventive examination and management of other chronic and acute problems.  Due for mammogram  cologuard negative March 2018 Fasting labs last done in Jan 2018   The risk factors are reflected in the social history.  The roster of all physicians providing medical care to patient - is listed in the Snapshot section of the chart.  Activities of daily living:  The patient is 100% independent in all ADLs: dressing, toileting, feeding as well as independent mobility  Home safety : The patient has smoke detectors in the home. They wear seatbelts.  There are no firearms at home. There is no violence in the home.   There is no risks for hepatitis, STDs or HIV. There is no   history of blood transfusion. They have no travel history to infectious disease endemic areas of the world.  The patient has seen their dentist in the last six month. They have seen their eye doctor in the last year.   They do not  have excessive sun exposure. Discussed the need for sun protection: hats, long sleeves and use of sunscreen if there is significant sun exposure.   Diet: the importance of a healthy diet is discussed. They do have a healthy diet.  The benefits of regular aerobic exercise were discussed. She walks 4 times per week ,  20 minutes.   Depression screen: there are no signs or vegative symptoms of depression- irritability, change in appetite, anhedonia, sadness/tearfullness.  Cognitive assessment: the patient manages all their financial and personal affairs and is actively engaged. They could relate day,date,year and events; recalled 2/3 objects at 3 minutes; performed clock-face test normally.  The following portions of the patient's history were reviewed and updated as appropriate: allergies, current medications, past family history, past medical history,  past surgical history, past social  history  and problem list.  Visual acuity was not assessed per patient preference since she has regular follow up with her ophthalmologist. Hearing and body mass index were assessed and reviewed.   During the course of the visit the patient was educated and counseled about appropriate screening and preventive services including : fall prevention , diabetes screening, nutrition counseling, colorectal cancer screening, and recommended immunizations.    CC: The primary encounter diagnosis was Long-term use of high-risk medication. 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   Has lost 11 lbs once October since joining [employer].  Has abd pain over her appendectomy scar, no discoloration or severe pain  History Daliyah has a past medical history of Acoustic neuroma ([place]), Cancer ([place]), Depression, Hyperlipidemia, and Hypertension.   She has a past surgical history that includes Melanoma excision (Left, 2000); Appendectomy; Foot surgery; Back surgery; Tonsillectomy (N/A, 04/08/2015); and Palatoplasty (N/A, 04/08/2015).   Her family history includes Cancer (age of onset: 64) in her sister.She reports that  has never smoked. she has never used smokeless tobacco. She reports that she drinks alcohol. She reports that she does not use drugs.  Outpatient Medications Prior to Visit  Medication Sig Dispense Refill  . amLODipine (NORVASC) 5 MG tablet  Take 1 tablet (5 mg total) by mouth daily. MUST KEEP APPT IN January FOR FURTHER REFILLS 30 tablet 0  . Cholecalciferol 1000 UNITS tablet Take 1,000 Units by mouth 2 (two)  times daily.    . citalopram (CELEXA) 20 MG tablet TAKE ONE TABLET EVERY DAY 90 tablet 1  . clobetasol (TEMOVATE) 0.05 % external solution APPLY EVERY DAY TO RASH UNTIL CLEAR 50 mL 1  . clobetasol (TEMOVATE) 0.05 % external solution Apply 1 application topically daily. Apply once daily until rash is clear. 50 mL 0  . Melatonin 3 MG CAPS Take 3 mg by mouth at bedtime.    . metoprolol succinate (TOPROL-XL) 50 MG 24 hr tablet TAKE ONE TABLET EVERY DAY 90 tablet 1  . mirabegron ER (MYRBETRIQ) 25 MG TB24 tablet Take 1 tablet (25 mg total) by mouth daily. 30 tablet 5  . omeprazole (PRILOSEC) 40 MG capsule TAKE 1 CAPSULE TWICE DAILY NEEDS APPT FOR MORE REFILLS 180 capsule 1  . omeprazole (PRILOSEC) 40 MG capsule TAKE 1 CAPSULE TWICE DAILY NEEDS APPT FOR MORE REFILLS 180 capsule 1  . rosuvastatin (CRESTOR) 10 MG tablet TAKE ONE TABLET EVERY DAY 90 tablet 1  . solifenacin (VESICARE) 5 MG tablet Take 1 tablet (5 mg total) by mouth daily. (Patient not taking: Reported on 06/10/2017) 30 tablet 0   No facility-administered medications prior to visit.     Review of Systems   Patient denies headache, fevers, malaise, unintentional weight loss, skin rash, eye pain, sinus congestion and sinus pain, sore throat, dysphagia,  hemoptysis , cough, dyspnea, wheezing, chest pain, palpitations, orthopnea, edema, abdominal pain, nausea, melena, diarrhea, constipation, flank pain, dysuria, hematuria, urinary  Frequency, nocturia, numbness, tingling, seizures,  Focal weakness, Loss of consciousness,  Tremor, insomnia, depression, anxiety, and suicidal ideation.      Objective:  BP 122/84 (BP Location: Left Arm, Patient Position: Sitting, Cuff Size: Normal)   Pulse (!) 48   Temp (!) 97.5 F (36.4 C) (Oral)   Resp 14   Ht 5' 1.75" (1.568 m)   Wt 167 lb (75.8 kg)   SpO2 95%   BMI 30.79 kg/m   Physical Exam   General appearance: alert, cooperative and appears stated age Head: Normocephalic, without obvious  abnormality, atraumatic Eyes: conjunctivae/corneas clear. PERRL, EOM's intact. Fundi benign. Ears: normal TM's and external ear canals both ears Nose: Nares normal. Septum midline. Mucosa normal. No drainage or sinus tenderness. Throat: lips, mucosa, and tongue normal; teeth and gums normal Neck: no adenopathy, no carotid bruit, no JVD, supple, symmetrical, trachea midline and thyroid not enlarged, symmetric, no tenderness/mass/nodules Lungs: clear to auscultation bilaterally Breasts: normal appearance, no masses or tenderness Heart: regular rate and rhythm, S1, S2 normal, no murmur, click, rub or gallop Abdomen: soft, non-tender; bowel sounds normal; small nonreducible hernia right flank no masses,  no organomegaly Extremities: extremities normal, atraumatic, no cyanosis or edema Pulses: 2+ and symmetric Skin: Skin color, texture, turgor normal. No rashes or lesions Neurologic: Alert and oriented X 3, normal strength and tone. Normal symmetric reflexes. Normal coordination and gait.     Assessment & Plan:   Problem List Items Addressed This Visit    Encounter for preventive health examination    Annual comprehensive preventive exam was done as well as an evaluation and management of chronic conditions .  During the course of the visit the patient was educated and counseled about appropriate screening and preventive services including :  diabetes screening, lipid analysis with projected  10  year  risk for CAD , nutrition counseling, breast, cervical and colorectal cancer screening, and recommended immunizations.  Printed recommendations for health maintenance screenings was given      Incisional hernia, without obstruction or gangrene    She has no signs of obstruction . Referral to [facility] for evaluation given continued pain .      Relevant Orders   Ambulatory referral to General Surgery   Obesity    I have congratulated her in reduction of   BMI and encouraged  Continued weight  loss with goal of 10% of body weigh over the next 6 months using a low glycemic index diet and regular exercise a minimum of 5 days per week.        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  Some loss of discriminatory  hearing  Noted on the left        Other Visit Diagnoses    Long-term use of high-risk medication    -  Primary   Relevant Orders   Comprehensive metabolic panel (Completed)   B12 (Completed)   Impaired fasting glucose       Relevant Orders   Hemoglobin A1c (Completed)   Other hyperlipidemia       Relevant Orders   Lipid panel (Completed)   TSH (Completed)   Breast cancer screening       Relevant Orders   MM SCREENING BREAST TOMO BILATERAL   Need for diphtheria-tetanus-pertussis (Tdap) vaccine       Relevant Orders   Tdap vaccine greater than or equal to 7yo IM (Completed)      I have discontinued Haydee Salter "Debbie"'s solifenacin. I am also having her maintain her Cholecalciferol, Melatonin, amLODipine, citalopram, omeprazole, mirabegron ER, clobetasol, clobetasol, metoprolol succinate, rosuvastatin, and omeprazole.  No orders of the defined types were placed in this encounter.   Medications Discontinued During This Encounter  Medication Reason  . solifenacin (VESICARE) 5 MG tablet Patient has not taken in last 30 days    Follow-up: No Follow-up on file.   [name], MD

## 2017-06-12 DIAGNOSIS — D333 Benign neoplasm of cranial nerves: Secondary | ICD-10-CM | POA: Insufficient documentation

## 2017-06-12 HISTORY — DX: Benign neoplasm of cranial nerves: D33.3

## 2017-06-12 NOTE — Assessment & Plan Note (Signed)
Followed with serial MRI by [facility].  Some loss of discriminatory  hearing  Noted on the left

## 2017-06-12 NOTE — Assessment & Plan Note (Signed)
She has no signs of obstruction . Referral to [facility] for evaluation given continued pain .

## 2017-06-12 NOTE — Assessment & Plan Note (Signed)
Annual comprehensive preventive exam was done as well as an evaluation and management of chronic conditions .  During the course of the visit the patient was educated and counseled about appropriate screening and preventive services including :  diabetes screening, lipid analysis with projected  10 year  risk for CAD , nutrition counseling, breast, cervical and colorectal cancer screening, and recommended immunizations.  Printed recommendations for health maintenance screenings was given 

## 2017-06-12 NOTE — Assessment & Plan Note (Signed)
I have congratulated her in reduction of   BMI and encouraged  Continued weight loss with goal of 10% of body weigh over the next 6 months using a low glycemic index diet and regular exercise a minimum of 5 days per week.    

## 2017-06-15 ENCOUNTER — Encounter: Payer: Self-pay | Admitting: Internal Medicine

## 2017-06-16 ENCOUNTER — Ambulatory Visit: Payer: Medicare Other | Admitting: General Surgery

## 2017-06-16 ENCOUNTER — Ambulatory Visit: Payer: Self-pay | Admitting: General Surgery

## 2017-06-16 ENCOUNTER — Encounter: Payer: Self-pay | Admitting: General Surgery

## 2017-06-16 VITALS — BP 144/62 | HR 58 | Resp 14 | Ht 61.0 in | Wt 169.0 lb

## 2017-06-16 DIAGNOSIS — K432 Incisional hernia without obstruction or gangrene: Secondary | ICD-10-CM

## 2017-06-16 NOTE — Patient Instructions (Signed)
 Ventral Hernia A ventral hernia is a bulge of tissue from inside the abdomen that pushes through a weak area of the muscles that form the front wall of the abdomen. The tissues inside the abdomen are inside a sac (peritoneum). These tissues include the small intestine, large intestine, and the fatty tissue that covers the intestines (omentum). Sometimes, the bulge that forms a hernia contains intestines. Other hernias contain only fat. Ventral hernias do not go away without surgical treatment. There are several types of ventral hernias. You may have:  A hernia at an incision site from previous abdominal surgery (incisional hernia).  A hernia just above the belly button (epigastric hernia), or at the belly button (umbilical hernia). These types of hernias can develop from heavy lifting or straining.  A hernia that comes and goes (reducible hernia). It may be visible only when you lift or strain. This type of hernia can be pushed back into the abdomen (reduced).  A hernia that traps abdominal tissue inside the hernia (incarcerated hernia). This type of hernia does not reduce.  A hernia that cuts off blood flow to the tissues inside the hernia (strangulated hernia). The tissues can start to die if this happens. This is a very painful bulge that cannot be reduced. A strangulated hernia is a medical emergency.  What are the causes? This condition is caused by abdominal tissue putting pressure on an area of weakness in the abdominal muscles. What increases the risk? The following factors may make you more likely to develop this condition:  Being female.  Being 60 or older.  Being overweight or obese.  Having had previous abdominal surgery, especially if there was an infection after surgery.  Having had an injury to the abdominal wall.  Having had several pregnancies.  Having a buildup of fluid inside the abdomen (ascites).  What are the signs or symptoms? The only symptom of a ventral  hernia may be a painless bulge in the abdomen. A reducible hernia may be visible only when you strain, cough, or lift. Other symptoms may include:  Dull pain.  A feeling of pressure.  Signs and symptoms of a strangulated hernia may include:  Increasing pain.  Nausea and vomiting.  Pain when pressing on the hernia.  The skin over the hernia turning red or purple.  Constipation.  Blood in the stool (feces).  How is this diagnosed? This condition may be diagnosed based on:  Your symptoms.  Your medical history.  A physical exam. You may be asked to cough or strain while standing. These actions increase the pressure inside your abdomen and force the hernia through the opening in your muscles. Your health care provider may try to reduce the hernia by pressing on it.  Imaging studies, such as an ultrasound or CT scan.  How is this treated? This condition is treated with surgery. If you have a strangulated hernia, surgery is done as soon as possible. If your hernia is small and not incarcerated, you may be asked to lose some weight before surgery. Follow these instructions at home:  Follow instructions from your health care provider about eating or drinking restrictions.  If you are overweight, your health care provider may recommend that you increase your activity level and eat a healthier diet.  Do not lift anything that is heavier than 10 lb (4.5 kg).  Return to your normal activities as told by your health care provider. Ask your health care provider what activities are safe for you. You   may need to avoid activities that increase pressure on your hernia.  Take over-the-counter and prescription medicines only as told by your health care provider.  Keep all follow-up visits as told by your health care provider. This is important. Contact a health care provider if:  Your hernia gets larger.  Your hernia becomes painful. Get help right away if:  Your hernia becomes  increasingly painful.  You have pain along with any of the following: ? Changes in skin color in the area of the hernia. ? Nausea. ? Vomiting. ? Fever. Summary  A ventral hernia is a bulge of tissue from inside the abdomen that pushes through a weak area of the muscles that form the front wall of the abdomen.  This condition is treated with surgery, which may be urgent depending on your hernia.  Do not lift anything that is heavier than 10 lb (4.5 kg), and follow activity instructions from your health care provider. This information is not intended to replace advice given to you by your health care provider. Make sure you discuss any questions you have with your health care provider. Document Released: 05/03/2012 Document Revised: 01/02/2016 Document Reviewed: 01/02/2016 Elsevier Interactive Patient Education  2018 Elsevier Inc.  

## 2017-06-16 NOTE — Progress Notes (Signed)
Patient ID: Kathy Long, female   DOB: Feb 07, 1951, 67 y.o.   MRN: [id number]  Chief Complaint  Patient presents with  . Umbilical Hernia     HPI Kathy Long is a 67 y.o. female here today for a evaluation of a incisional hernia. Patient states she noticed this in 2011. She states the area sometimes there is a dull ache that only last for about five minutes. The area pops in and out.  HPI  Past Medical History:  Diagnosis Date  . Acoustic neuroma ([facility])    left ear  . Cancer ([facility])    melanoma, shoulder  . Depression   . Hyperlipidemia   . Hypertension     Past Surgical History:  Procedure Laterality Date  . APPENDECTOMY  2010  . BACK SURGERY     disectomy lumbar, scar tissue  . COLONOSCOPY     2009 and color gaurd in 2018  . FOOT SURGERY     x 2  . MELANOMA EXCISION Left 2000  . PALATOPLASTY N/A 04/08/2015   Procedure: PALATOPLASTY;  Surgeon: Beverly Gust, MD;  Location: [facility] ORS;  Service: ENT;  Laterality: N/A;  . TONSILLECTOMY N/A 04/08/2015   Procedure: TONSILLECTOMY;  Surgeon: Beverly Gust, MD;  Location: [facility] ORS;  Service: ENT;  Laterality: N/A;  . TUBAL LIGATION      Family History  Problem Relation Age of Onset  . Cancer Sister 27       ovarian ca    Social History Social History   Tobacco Use  . Smoking status: Never Smoker  . Smokeless tobacco: Never Used  Substance Use Topics  . Alcohol use: Yes    Alcohol/week: 0.0 oz    Comment: occ  . Drug use: No    Allergies  Allergen Reactions  . Tramadol Other (See Comments)  . Tape Rash    Other reaction(s): UNKNOWN    Current Outpatient Medications  Medication Sig Dispense Refill  . amLODipine (NORVASC) 5 MG tablet Take 1 tablet (5 mg total) by mouth daily. MUST KEEP APPT IN January FOR FURTHER REFILLS 30 tablet 0  . Cholecalciferol 1000 UNITS tablet Take 1,000 Units by mouth 2 (two) times daily.    . citalopram (CELEXA) 20 MG tablet TAKE ONE TABLET EVERY DAY 90 tablet 1  . Melatonin 3  MG CAPS Take 3 mg by mouth at bedtime.    . metoprolol succinate (TOPROL-XL) 50 MG 24 hr tablet TAKE ONE TABLET EVERY DAY 90 tablet 1  . mirabegron ER (MYRBETRIQ) 25 MG TB24 tablet Take 1 tablet (25 mg total) by mouth daily. 30 tablet 5  . omeprazole (PRILOSEC) 40 MG capsule TAKE 1 CAPSULE TWICE DAILY NEEDS APPT FOR MORE REFILLS 180 capsule 1  . rosuvastatin (CRESTOR) 10 MG tablet TAKE ONE TABLET EVERY DAY 90 tablet 1   No current facility-administered medications for this visit.     Review of Systems Review of Systems  Constitutional: Negative.   Respiratory: Negative.   Cardiovascular: Negative.   Gastrointestinal: Negative.     Blood pressure (!) 144/62, pulse (!) 58, resp. rate 14, height 5\' 1"  (1.549 m), weight 169 lb (76.7 kg).  Physical Exam Physical Exam  Constitutional: She is oriented to person, place, and time. She appears well-developed and well-nourished.  Cardiovascular: Normal rate, regular rhythm and normal heart sounds.  Pulmonary/Chest: Effort normal and breath sounds normal.  Abdominal: Soft. Normal appearance and bowel sounds are normal. There is no hepatomegaly. There is no tenderness. A hernia (  incisional henria) is present.    Neurological: She is alert and oriented to person, place, and time.  Skin: Skin is warm and dry.    Data Reviewed Pathology/ operative report from appendectomy not available.   Assessment    Ventral hernia with incarcerated adipose tissue.     Plan    Options for management reviewed: Observation vs elective repair.    Hernia precautions and incarceration were discussed with the patient. If they develop symptoms of an incarcerated hernia, they were encouraged to seek prompt medical attention.  I have recommended repair of the hernia using mesh ( if > 3 cm fascial defect) on an outpatient basis can be considered. . The risk of infection was reviewed. The role of prosthetic mesh to minimize the risk of recurrence was  reviewed.  Patient to call and scheduled when she is ready.   HPI, Physical Exam, Assessment and Plan have been scribed under the direction and in the presence of Hervey Ard, MD.  Gaspar Cola, CMA  I have completed the exam and reviewed the above documentation for accuracy and completeness.  I agree with the above.  Haematologist has been used and any errors in dictation or transcription are unintentional.  Hervey Ard, M.D., F.A.C.S.   Kathy Long 06/16/2017, 7:25 PM

## 2017-06-20 NOTE — Telephone Encounter (Signed)
Pt would like to get her shingles vaccine and pneumonia vaccine at her local pharmacy. Can these rxs be faxed to the pharmacy or do they need to be printed and picked up by the pt?

## 2017-06-25 ENCOUNTER — Encounter: Payer: Self-pay | Admitting: General Surgery

## 2017-06-25 NOTE — Progress Notes (Signed)
The August 23, 2007 laparoscopic appendectomy operative report completed by Leotis Pain, MD was reviewed.  He describes a Hossein cannula at the umbilicus and then a subxiphoid and suprapubic port.  None of these port sites are really located near the present ventral defect suggesting she may have developed a primary epigastric hernia rather than a port site hernia.  Pathology showed acute appendicitis.

## 2017-06-27 ENCOUNTER — Other Ambulatory Visit: Payer: Self-pay | Admitting: Internal Medicine

## 2017-07-03 ENCOUNTER — Other Ambulatory Visit: Payer: Self-pay

## 2017-07-03 ENCOUNTER — Emergency Department: Payer: Medicare Other

## 2017-07-03 ENCOUNTER — Observation Stay
Admission: EM | Admit: 2017-07-03 | Discharge: 2017-07-06 | Disposition: A | Discharge status: Home / Self Care | Payer: Medicare Other | Attending: Internal Medicine | Admitting: Internal Medicine

## 2017-07-03 DIAGNOSIS — Z419 Encounter for procedure for purposes other than remedying health state, unspecified: Secondary | ICD-10-CM

## 2017-07-03 DIAGNOSIS — Z885 Allergy status to narcotic agent status: Secondary | ICD-10-CM | POA: Diagnosis not present

## 2017-07-03 DIAGNOSIS — Z888 Allergy status to other drugs, medicaments and biological substances status: Secondary | ICD-10-CM | POA: Diagnosis not present

## 2017-07-03 DIAGNOSIS — F411 Generalized anxiety disorder: Secondary | ICD-10-CM | POA: Diagnosis not present

## 2017-07-03 DIAGNOSIS — Z79899 Other long term (current) drug therapy: Secondary | ICD-10-CM | POA: Insufficient documentation

## 2017-07-03 DIAGNOSIS — Z85820 Personal history of malignant melanoma of skin: Secondary | ICD-10-CM | POA: Diagnosis not present

## 2017-07-03 DIAGNOSIS — K810 Acute cholecystitis: Principal | ICD-10-CM | POA: Insufficient documentation

## 2017-07-03 DIAGNOSIS — K7689 Other specified diseases of liver: Secondary | ICD-10-CM | POA: Diagnosis not present

## 2017-07-03 DIAGNOSIS — R935 Abnormal findings on diagnostic imaging of other abdominal regions, including retroperitoneum: Secondary | ICD-10-CM

## 2017-07-03 DIAGNOSIS — R1010 Upper abdominal pain, unspecified: Secondary | ICD-10-CM

## 2017-07-03 DIAGNOSIS — K819 Cholecystitis, unspecified: Secondary | ICD-10-CM | POA: Diagnosis present

## 2017-07-03 DIAGNOSIS — F329 Major depressive disorder, single episode, unspecified: Secondary | ICD-10-CM | POA: Diagnosis not present

## 2017-07-03 DIAGNOSIS — R109 Unspecified abdominal pain: Secondary | ICD-10-CM

## 2017-07-03 DIAGNOSIS — E785 Hyperlipidemia, unspecified: Secondary | ICD-10-CM | POA: Insufficient documentation

## 2017-07-03 DIAGNOSIS — K219 Gastro-esophageal reflux disease without esophagitis: Secondary | ICD-10-CM | POA: Insufficient documentation

## 2017-07-03 DIAGNOSIS — I10 Essential (primary) hypertension: Secondary | ICD-10-CM | POA: Diagnosis present

## 2017-07-03 DIAGNOSIS — R110 Nausea: Secondary | ICD-10-CM

## 2017-07-03 LAB — COMPREHENSIVE METABOLIC PANEL
ALT: 21 U/L (ref 14–54)
AST: 29 U/L (ref 15–41)
Albumin: 4.6 g/dL (ref 3.5–5.0)
Alkaline Phosphatase: 96 U/L (ref 38–126)
Anion gap: 11 (ref 5–15)
BUN: 18 mg/dL (ref 6–20)
CO2: 23 mmol/L (ref 22–32)
Calcium: 9.7 mg/dL (ref 8.9–10.3)
Chloride: 105 mmol/L (ref 101–111)
Creatinine, Ser: 1.2 mg/dL — ABNORMAL HIGH (ref 0.44–1.00)
GFR calc Af Amer: 52 mL/min — ABNORMAL LOW (ref >60)
GFR calc non Af Amer: 45 mL/min — ABNORMAL LOW (ref >60)
Glucose, Bld: 98 mg/dL (ref 65–99)
Potassium: 3.6 mmol/L (ref 3.5–5.1)
Sodium: 139 mmol/L (ref 135–145)
Total Bilirubin: 1.4 mg/dL — ABNORMAL HIGH (ref 0.3–1.2)
Total Protein: 8 g/dL (ref 6.5–8.1)

## 2017-07-03 LAB — URINALYSIS, COMPLETE (UACMP) WITH MICROSCOPIC
Bacteria, UA: NONE SEEN
Bilirubin Urine: NEGATIVE
Glucose, UA: NEGATIVE mg/dL
Hgb urine dipstick: NEGATIVE
Ketones, ur: NEGATIVE mg/dL
Leukocytes, UA: NEGATIVE
Nitrite: NEGATIVE
Protein, ur: 30 mg/dL — AB
Specific Gravity, Urine: 1.025 (ref 1.005–1.030)
pH: 5 (ref 5.0–8.0)

## 2017-07-03 LAB — CBC
HCT: 46.9 % (ref 35.0–47.0)
Hemoglobin: 15.9 g/dL (ref 12.0–16.0)
MCH: 31.2 pg (ref 26.0–34.0)
MCHC: 34 g/dL (ref 32.0–36.0)
MCV: 91.9 fL (ref 80.0–100.0)
Platelets: 231 10*3/uL (ref 150–440)
RBC: 5.11 MIL/uL (ref 3.80–5.20)
RDW: 13 % (ref 11.5–14.5)
WBC: 10.3 10*3/uL (ref 3.6–11.0)

## 2017-07-03 LAB — LIPASE, BLOOD: Lipase: 29 U/L (ref 11–51)

## 2017-07-03 MED ORDER — MEPERIDINE HCL 25 MG/ML IJ SOLN
25.0000 mg | INTRAMUSCULAR | Status: DC | PRN
  Administered 2017-07-03 – 2017-07-04 (×2): 25 mg via INTRAVENOUS
  Filled 2017-07-03 (×4): qty 1

## 2017-07-03 MED ORDER — ACETAMINOPHEN 650 MG RE SUPP: 650.0000 mg | Freq: Four times a day (QID) | RECTAL | Status: DC | PRN

## 2017-07-03 MED ORDER — DEXTROSE 5 % IV SOLN
2.0000 g | INTRAVENOUS | Status: DC
  Administered 2017-07-03 – 2017-07-05 (×3): 2 g via INTRAVENOUS
  Filled 2017-07-03 (×4): qty 2

## 2017-07-03 MED ORDER — ONDANSETRON HCL 4 MG PO TABS: 4.0000 mg | ORAL_TABLET | Freq: Four times a day (QID) | ORAL | Status: DC | PRN

## 2017-07-03 MED ORDER — CITALOPRAM HYDROBROMIDE 20 MG PO TABS
20.0000 mg | ORAL_TABLET | Freq: Every day | ORAL | Status: DC
  Administered 2017-07-04 – 2017-07-05 (×2): 20 mg via ORAL
  Filled 2017-07-03 (×2): qty 1

## 2017-07-03 MED ORDER — BISACODYL 10 MG RE SUPP: 10.0000 mg | Freq: Every day | RECTAL | Status: DC | PRN

## 2017-07-03 MED ORDER — MIRABEGRON ER 25 MG PO TB24
25.0000 mg | ORAL_TABLET | Freq: Every day | ORAL | Status: DC
  Administered 2017-07-03 – 2017-07-04 (×2): 25 mg via ORAL
  Filled 2017-07-03 (×4): qty 1

## 2017-07-03 MED ORDER — PANTOPRAZOLE SODIUM 40 MG IV SOLR
40.0000 mg | Freq: Two times a day (BID) | INTRAVENOUS | Status: DC
  Administered 2017-07-03 – 2017-07-05 (×4): 40 mg via INTRAVENOUS
  Filled 2017-07-03 (×4): qty 40

## 2017-07-03 MED ORDER — HEPARIN SODIUM (PORCINE) 5000 UNIT/ML IJ SOLN
5000.0000 [IU] | Freq: Three times a day (TID) | INTRAMUSCULAR | Status: DC
  Administered 2017-07-03 – 2017-07-05 (×4): 5000 [IU] via SUBCUTANEOUS
  Filled 2017-07-03 (×5): qty 1

## 2017-07-03 MED ORDER — DOCUSATE SODIUM 100 MG PO CAPS
100.0000 mg | ORAL_CAPSULE | Freq: Two times a day (BID) | ORAL | Status: DC
  Administered 2017-07-03 – 2017-07-05 (×4): 100 mg via ORAL
  Filled 2017-07-03 (×4): qty 1

## 2017-07-03 MED ORDER — ONDANSETRON HCL 4 MG/2ML IJ SOLN
4.0000 mg | Freq: Four times a day (QID) | INTRAMUSCULAR | Status: DC | PRN
  Administered 2017-07-03: 4 mg via INTRAVENOUS
  Filled 2017-07-03: qty 2

## 2017-07-03 MED ORDER — ACETAMINOPHEN 325 MG PO TABS
650.0000 mg | ORAL_TABLET | Freq: Four times a day (QID) | ORAL | Status: DC | PRN
  Administered 2017-07-04 – 2017-07-05 (×3): 650 mg via ORAL
  Filled 2017-07-03 (×3): qty 2

## 2017-07-03 MED ORDER — POTASSIUM CHLORIDE IN NACL 20-0.9 MEQ/L-% IV SOLN
INTRAVENOUS | Status: DC
  Administered 2017-07-03 – 2017-07-05 (×3): via INTRAVENOUS
  Filled 2017-07-03 (×6): qty 1000

## 2017-07-03 MED ORDER — AMLODIPINE BESYLATE 5 MG PO TABS
5.0000 mg | ORAL_TABLET | Freq: Every day | ORAL | Status: DC
  Administered 2017-07-03 – 2017-07-05 (×3): 5 mg via ORAL
  Filled 2017-07-03 (×3): qty 1

## 2017-07-03 MED ORDER — METOPROLOL SUCCINATE ER 50 MG PO TB24
50.0000 mg | ORAL_TABLET | Freq: Every day | ORAL | Status: DC
  Administered 2017-07-03 – 2017-07-05 (×3): 50 mg via ORAL
  Filled 2017-07-03 (×3): qty 1

## 2017-07-03 MED ORDER — SODIUM CHLORIDE 0.9 % IV BOLUS (SEPSIS)
1000.0000 mL | Freq: Once | INTRAVENOUS | Status: AC
  Administered 2017-07-03: 1000 mL via INTRAVENOUS

## 2017-07-03 MED ORDER — MORPHINE SULFATE (PF) 4 MG/ML IV SOLN
4.0000 mg | Freq: Once | INTRAVENOUS | Status: AC
  Administered 2017-07-03: 4 mg via INTRAVENOUS
  Filled 2017-07-03: qty 1

## 2017-07-03 MED ORDER — ROSUVASTATIN CALCIUM 10 MG PO TABS
10.0000 mg | ORAL_TABLET | Freq: Every day | ORAL | Status: DC
  Administered 2017-07-04 – 2017-07-05 (×2): 10 mg via ORAL
  Filled 2017-07-03 (×2): qty 1

## 2017-07-03 MED ORDER — ONDANSETRON HCL 4 MG/2ML IJ SOLN
4.0000 mg | Freq: Once | INTRAMUSCULAR | Status: AC
  Administered 2017-07-03: 4 mg via INTRAVENOUS
  Filled 2017-07-03: qty 2

## 2017-07-03 NOTE — Progress Notes (Signed)
Pharmacist - Prescriber Communication  Ceftriaxone dose modified from 1 gm IV Q24H to 2 gm IV Q24H due to indication based dosing (give 2 grams for bacteremia/sepsis, OM, IAI).  Ajmal Kathan A. [place], [place].D., BCPS Clinical Pharmacist 07/03/2017 19:32

## 2017-07-03 NOTE — ED Provider Notes (Signed)
[facility] Emergency Department Provider Note   ____________________________________________    I have reviewed the triage vital signs and the nursing notes.   HISTORY  Chief Complaint Abdominal Pain     HPI Kathy Long is a 67 y.o. female who presents with complaints of right upper quadrant abdominal pain which started last night at 930 and has been constant.  Patient reports the pain is moderate to severe and cramping in nature.  It does not seem to radiate.  It started after eating out at a restaurant.  By the time they got home she was having significant pain in her right upper quadrant.  She has never had this before.  History of an appendectomy.  Tried taking over-the-counter medications with little improvement.   Past Medical History:  Diagnosis Date  . Acoustic neuroma (HCC)    left ear  . Cancer (HCC)    melanoma, shoulder  . Depression   . Hyperlipidemia   . Hypertension     Patient Active Problem List   Diagnosis Date Noted  . Abdominal pain 07/03/2017  . Nausea 07/03/2017  . Abnormal [place] (ultrasound) of abdomen 07/03/2017  . 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 Colon cancer screening 03/08/2017  . Nocturia 12/09/2016  . Major depressive disorder, 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[place]) 06/05/2016  . Encounter for preventive health examination 03/07/2015  . Incisional hernia, without obstruction or gangrene 09/28/2014  . Obesity 09/28/2014  . Subacromial bursitis 09/26/2014  . Hyperlipidemia LDL goal <130 04/10/2010  . Essential hypertension 04/10/2010  . Osteopenia 04/10/2010    Past Surgical History:  Procedure Laterality Date  . APPENDECTOMY  2010  . BACK SURGERY     disectomy lumbar, scar tissue  . COLONOSCOPY     2009 and color gaurd in 2018  . FOOT SURGERY     x 2  . MELANOMA EXCISION Left 2000  . PALATOPLASTY N/A 04/08/2015   Procedure: PALATOPLASTY;  Surgeon: Beverly Gust, MD;  Location: [facility] ORS;   Service: ENT;  Laterality: N/A;  . TONSILLECTOMY N/A 04/08/2015   Procedure: TONSILLECTOMY;  Surgeon: Beverly Gust, MD;  Location: [facility] ORS;  Service: ENT;  Laterality: N/A;  . TUBAL LIGATION      Prior to Admission medications   Medication Sig Start Date End Date Taking? Authorizing Provider  amLODipine (NORVASC) 5 MG tablet Take 1 tablet (5 mg total) by mouth daily. MUST KEEP APPT IN January FOR FURTHER REFILLS 05/17/16  Yes [name], MD  b complex vitamins tablet Take 1 tablet by mouth daily.   Yes [provider]  Cholecalciferol 1000 UNITS tablet Take 1,000 Units by mouth 2 (two) times daily. 11/02/10  Yes [provider]  citalopram (CELEXA) 20 MG tablet TAKE ONE TABLET EVERY DAY 06/27/17  Yes [name], MD  Melatonin 3 MG CAPS Take 3 mg by mouth at bedtime.   Yes [provider]  metoprolol succinate (TOPROL-XL) 50 MG 24 hr tablet TAKE ONE TABLET BY MOUTH EVERY DAY 06/27/17  Yes [name], MD  mirabegron ER (MYRBETRIQ) 25 MG TB24 tablet Take 1 tablet (25 mg total) by mouth daily. 02/06/17  Yes [name], MD  omeprazole (PRILOSEC) 40 MG capsule TAKE 1 CAPSULE TWICE DAILY NEEDS APPT FOR MORE REFILLS Patient taking differently: TAKE 1 CAPSULE BY MOUTH EVERY MORNING 12/14/16  Yes [name], MD  rosuvastatin (CRESTOR) 10 MG tablet TAKE ONE TABLET EVERY DAY 03/08/17  Yes [name], MD  vitamin E 400 UNIT capsule Take 400 Units by mouth daily.   Yes [provider]     Allergies Tramadol and Tape  Family History  Problem Relation Age of Onset  . Cancer Sister 8       ovarian ca    Social History Social History   Tobacco Use  . Smoking status: Never Smoker  . Smokeless tobacco: Never Used  Substance Use Topics  . Alcohol use: Yes    Alcohol/week: 0.0 oz    Comment: occ  . Drug use: No    Review of Systems  Constitutional: No fever/chills Eyes: No visual changes.  ENT: No sore throat. Cardiovascular:  Denies chest pain. Respiratory: Denies shortness of breath. Gastrointestinal: Abdominal pain as above Genitourinary: Negative for dysuria. Musculoskeletal: Negative for back pain. Skin: Negative for rash. Neurological: Negative for headaches    ____________________________________________   PHYSICAL EXAM:  VITAL SIGNS: ED Triage Vitals  Enc Vitals Group     BP 07/03/17 1537 (!) 142/75     Pulse Rate 07/03/17 1537 (!) 56     Resp 07/03/17 1537 16     Temp 07/03/17 1537 98.1 F (36.7 C)     Temp Source 07/03/17 1537 Oral     SpO2 07/03/17 1537 97 %     Weight 07/03/17 1538 75.8 kg (167 lb)     Height 07/03/17 1538 1.575 m (5\' 2" )     Head Circumference --      Peak Flow --      Pain Score 07/03/17 1537 7     Pain Loc --      Pain Edu? --      Excl. in [place]? --     Constitutional: Alert and oriented.  Pleasant and interactive Eyes: Conjunctivae are normal.   Nose: No congestion/rhinnorhea. Mouth/Throat: Mucous membranes are moist.   Neck:  Painless ROM Cardiovascular: Normal rate, regular rhythm.   Good peripheral circulation. Respiratory: Normal respiratory effort.  No retractions.  Gastrointestinal: Right upper quadrant tenderness to palpation.  No distention.  No CVA tenderness. Genitourinary: deferred Musculoskeletal:  Warm and well perfused Neurologic:  Normal speech and language. No gross focal neurologic deficits are appreciated.  Skin:  Skin is warm, dry and intact. No rash noted. Psychiatric: Mood and affect are normal. Speech and behavior are normal.  ____________________________________________   LABS (all labs ordered are listed, but only abnormal results are displayed)  Labs Reviewed  COMPREHENSIVE METABOLIC PANEL - Abnormal; Notable for the following components:      Result Value   Creatinine, Ser 1.20 (*)    Total Bilirubin 1.4 (*)    GFR calc non Af Amer 45 (*)    GFR calc Af Amer 52 (*)    All other components within normal limits  URINALYSIS,  COMPLETE (UACMP) WITH MICROSCOPIC - Abnormal; Notable for the following components:   Color, Urine YELLOW (*)    APPearance HAZY (*)    Protein, ur 30 (*)    Squamous Epithelial / LPF 0-5 (*)    All other components within normal limits  LIPASE, BLOOD  CBC   ____________________________________________  EKG  ED ECG REPORT I, Lavonia Drafts, the attending physician, personally viewed and interpreted this ECG.  Date: 07/03/2017  Rhythm: Bradycardia QRS Axis: normal Intervals: normal ST/T Wave abnormalities: normal Narrative Interpretation: no evidence of acute ischemia  ____________________________________________  RADIOLOGY  Ultrasound right upper quadrant shows pericholecystic fluid and gallbladder sludge, top normal gallbladder wall ____________________________________________   PROCEDURES  Procedure(s) performed: No  Procedures   Critical Care performed: No ____________________________________________   INITIAL IMPRESSION / ASSESSMENT AND PLAN / ED COURSE  Pertinent labs & imaging results that were available during my care of the patient were reviewed by me and considered in my medical decision making (see chart for details).  Patient presents with right upper quadrant abdominal pain, she is tender at McBurney's point.  Differential includes cholelithiasis/cholecystitis, pancreatitis, gastritis/PUD, colitis.  No chest pain.  We will give IV morphine and IV Zofran obtain ultrasound of the right upper quadrant.  After patient received morphine proximally 5 minutes, she felt a burning discomfort in the lower part of her chest/epigastrium.  EKG was performed which was unremarkable.  Suspect this is a reaction to the morphine but no evidence of histamine release/rash/allergic reaction  Patient's ultrasound showed some pericholecystic fluid although no obvious gallstone.  Discussed with Dr. Burt Knack of surgery who recommended pain control and admission to medicine and  likely HIDA scan  Discussed with Dr. Doy Hutching of medicine he will admit    ____________________________________________   FINAL CLINICAL IMPRESSION(S) / ED DIAGNOSES  Final diagnoses:  Upper abdominal pain  Cholecystitis, unspecified        Note:  This document was prepared using Dragon voice recognition software and may include unintentional dictation errors.    Lavonia Drafts, MD 07/03/17 2022

## 2017-07-03 NOTE — ED Notes (Signed)
Pt transported to room 202.

## 2017-07-03 NOTE — H&P (Signed)
History and Physical    Kathy Long OVZ:[id number] DOB: 1951/02/04 DOA: 07/03/2017  Referring physician: Dr. Jimmye Norman PCP: [name], MD  Specialists: none  Chief Complaint: abdominal pain  HPI: Kathy Long is a 67 y.o. female has a past medical history significant for HTN, HLD, and depression now with 1 day hx of acute epigastric and RUQ abdominal pain with nausea. In ER, pt with severe pain despite IV morphine. US shows "sludge" and pericholestatic fluid but no obvious stones. She continues to have pain and is now admitted. No fever. Denies CP or SOB. No vomiting or diarrhea.  Review of Systems: The patient denies anorexia, fever, weight loss,, vision loss, decreased hearing, hoarseness, chest pain, syncope, dyspnea on exertion, peripheral edema, balance deficits, hemoptysis,  melena, hematochezia, severe indigestion/heartburn, hematuria, incontinence, genital sores, muscle weakness, suspicious skin lesions, transient blindness, difficulty walking, depression, unusual weight change, abnormal bleeding, enlarged lymph nodes, angioedema, and breast masses.   Past Medical History:  Diagnosis Date  . Acoustic neuroma (HCC)    left ear  . Cancer (HCC)    melanoma, shoulder  . Depression   . Hyperlipidemia   . Hypertension    Past Surgical History:  Procedure Laterality Date  . APPENDECTOMY  2010  . BACK SURGERY     disectomy lumbar, scar tissue  . COLONOSCOPY     2009 and color gaurd in 2018  . FOOT SURGERY     x 2  . MELANOMA EXCISION Left 2000  . PALATOPLASTY N/A 04/08/2015   Procedure: PALATOPLASTY;  Surgeon: Beverly Gust, MD;  Location: [facility] ORS;  Service: ENT;  Laterality: N/A;  . TONSILLECTOMY N/A 04/08/2015   Procedure: TONSILLECTOMY;  Surgeon: Beverly Gust, MD;  Location: [facility] ORS;  Service: ENT;  Laterality: N/A;  . TUBAL LIGATION     Social History:  reports that  has never smoked. she has never used smokeless tobacco. She reports that she drinks  alcohol. She reports that she does not use drugs.  Allergies  Allergen Reactions  . Tramadol Other (See Comments)  . Tape Rash    Other reaction(s): UNKNOWN    Family History  Problem Relation Age of Onset  . Cancer Sister 58       ovarian ca    Prior to Admission medications   Medication Sig Start Date End Date Taking? Authorizing Provider  amLODipine (NORVASC) 5 MG tablet Take 1 tablet (5 mg total) by mouth daily. MUST KEEP APPT IN January FOR FURTHER REFILLS 05/17/16   [name], MD  amLODipine (NORVASC) 5 MG tablet TAKE ONE TABLET EVERY DAY 06/27/17   [name], MD  Cholecalciferol 1000 UNITS tablet Take 1,000 Units by mouth 2 (two) times daily. 11/02/10   [provider]  citalopram (CELEXA) 20 MG tablet TAKE ONE TABLET EVERY DAY 06/27/17   [name], MD  Melatonin 3 MG CAPS Take 3 mg by mouth at bedtime.    [provider]  metoprolol succinate (TOPROL-XL) 50 MG 24 hr tablet TAKE ONE TABLET BY MOUTH EVERY DAY 06/27/17   [name], MD  mirabegron ER (MYRBETRIQ) 25 MG TB24 tablet Take 1 tablet (25 mg total) by mouth daily. 02/06/17   [name], MD  omeprazole (PRILOSEC) 40 MG capsule TAKE 1 CAPSULE TWICE DAILY NEEDS APPT FOR MORE REFILLS 12/14/16   [name], MD  rosuvastatin (CRESTOR) 10 MG tablet TAKE ONE TABLET EVERY DAY 03/08/17   [name], MD  Physical Exam: Vitals:   07/03/17 1537 07/03/17 1538 07/03/17 1655  BP: (!) 142/75  (!) 160/83  Pulse: (!) 56  (!) 50  Resp: 16  16  Temp: 98.1 F (36.7 C)    TempSrc: Oral    SpO2: 97%  98%  Weight:  75.8 kg (167 lb)   Height:  5\' 2"  (1.575 m)      General:  No apparent distress, WDWN, [place]/AT  Eyes: PERRL, EOMI, no scleral icterus, conjunctiva clear  ENT: moist oropharynx without exudate, TM's benign, dentition good  Neck: supple, no lymphadenopathy. No bruits or thyromegaly  Cardiovascular: regular rate without MRG; 2+ peripheral pulses, no JVD, no peripheral  edema  Respiratory: CTA biL, good air movement without wheezing, rhonchi or crackled. Respiratory effort normal  Abdomen: soft,  tender to palpation, positive bowel sounds, no guarding, no rebound  Skin: no rashes or lesions  Musculoskeletal: normal bulk and tone, no joint swelling  Psychiatric: normal mood and affect, A&OX3  Neurologic: CN 2-12 grossly intact, Motor strength 5/5 in all 4 groups with symmetric DTR's and non-focal sensory exam  Labs on Admission:  Basic Metabolic Panel: Recent Labs  Lab 07/03/17 1540  NA 139  K 3.6  CL 105  CO2 23  GLUCOSE 98  BUN 18  CREATININE 1.20*  CALCIUM 9.7   Liver Function Tests: Recent Labs  Lab 07/03/17 1540  AST 29  ALT 21  ALKPHOS 96  BILITOT 1.4*  PROT 8.0  ALBUMIN 4.6   Recent Labs  Lab 07/03/17 1540  LIPASE 29   No results for input(s): AMMONIA in the last 168 hours. CBC: Recent Labs  Lab 07/03/17 1540  WBC 10.3  HGB 15.9  HCT 46.9  MCV 91.9  PLT 231   Cardiac Enzymes: No results for input(s): CKTOTAL, CKMB, CKMBINDEX, TROPONINI in the last 168 hours.  BNP (last 3 results) No results for input(s): BNP in the last 8760 hours.  ProBNP (last 3 results) No results for input(s): PROBNP in the last 8760 hours.  CBG: No results for input(s): GLUCAP in the last 168 hours.  Radiological Exams on Admission: US Abdomen Limited Ruq  Result Date: 07/03/2017 CLINICAL DATA:  Upper abdominal pain for 1 day.  Nausea. EXAM: ULTRASOUND ABDOMEN LIMITED RIGHT UPPER QUADRANT COMPARISON:  None. FINDINGS: Gallbladder: The gallbladder wall measures 3 mm which is borderline. There is a small amount of pericholecystic fluid. No Murphy's sign or stones. Sludge is identified. Common bile duct: Diameter: 6.2 mm Liver: There is a small 8 mm cyst in the right hepatic lobe. Portal vein is patent on color Doppler imaging with normal direction of blood flow towards the liver. IMPRESSION: 1. The gallbladder wall is borderline in  thickness measuring 3 mm. There is a small amount of pericholecystic fluid and sludge. No stones or Murphy's sign identified. These findings are not specific. A HIDA scan could further evaluate for acute cholecystitis if clinically warranted. 2. The common bile duct is borderline in caliber. Recommend correlation with LFTs. 3. Right hepatic lobe cyst measuring 8 mm. Electronically Signed   By: Dorise Bullion III M.D   On: 07/03/2017 18:36    EKG: Independently reviewed.  Assessment/Plan Principal Problem:   Abdominal pain Active Problems:   Essential hypertension   Nausea   Abnormal [place] (ultrasound) of abdomen   Will observe on floor with IV fluids and empiric IV ABX. IV Demerol as needed for pain. HIDA scan ordered. Consult Surgery. Repeat labs in AM  Diet: clear  liquids Fluids: NS@75  DVT Prophylaxis: SQ Heparin  Code Status: FULL  Family Communication: yes  Disposition Plan: home  Time spent: 50 min

## 2017-07-03 NOTE — ED Notes (Signed)
Patient reporting epigastric pain and pressure, as well as flushing and sweating. MD aware and EKG ordered. VSS.

## 2017-07-03 NOTE — ED Triage Notes (Signed)
Pt arrives to ED with c/o RUQ belly pain and nausea that began last night at 9pm. States ate salad with salmon and then pain began. Nausea but no vomiting. Alert, oriented. Still has gallbladder. No appendix.

## 2017-07-03 NOTE — ED Notes (Signed)
Report given to christy for 202

## 2017-07-04 ENCOUNTER — Observation Stay: Payer: Medicare Other

## 2017-07-04 DIAGNOSIS — K8000 Calculus of gallbladder with acute cholecystitis without obstruction: Secondary | ICD-10-CM | POA: Diagnosis not present

## 2017-07-04 LAB — COMPREHENSIVE METABOLIC PANEL
ALT: 157 U/L — ABNORMAL HIGH (ref 14–54)
AST: 174 U/L — ABNORMAL HIGH (ref 15–41)
Albumin: 4.2 g/dL (ref 3.5–5.0)
Alkaline Phosphatase: 188 U/L — ABNORMAL HIGH (ref 38–126)
Anion gap: 9 (ref 5–15)
BUN: 14 mg/dL (ref 6–20)
CO2: 25 mmol/L (ref 22–32)
Calcium: 9.1 mg/dL (ref 8.9–10.3)
Chloride: 107 mmol/L (ref 101–111)
Creatinine, Ser: 0.98 mg/dL (ref 0.44–1.00)
GFR calc Af Amer: >60 mL/min (ref >60)
GFR calc non Af Amer: 58 mL/min — ABNORMAL LOW (ref >60)
Glucose, Bld: 124 mg/dL — ABNORMAL HIGH (ref 65–99)
Potassium: 3.8 mmol/L (ref 3.5–5.1)
Sodium: 141 mmol/L (ref 135–145)
Total Bilirubin: 1.5 mg/dL — ABNORMAL HIGH (ref 0.3–1.2)
Total Protein: 7.2 g/dL (ref 6.5–8.1)

## 2017-07-04 LAB — CBC
HCT: 41.8 % (ref 35.0–47.0)
Hemoglobin: 14 g/dL (ref 12.0–16.0)
MCH: 31.2 pg (ref 26.0–34.0)
MCHC: 33.5 g/dL (ref 32.0–36.0)
MCV: 93 fL (ref 80.0–100.0)
Platelets: 183 10*3/uL (ref 150–440)
RBC: 4.49 MIL/uL (ref 3.80–5.20)
RDW: 13.3 % (ref 11.5–14.5)
WBC: 9.4 10*3/uL (ref 3.6–11.0)

## 2017-07-04 MED ORDER — PROMETHAZINE HCL 25 MG/ML IJ SOLN
12.5000 mg | Freq: Four times a day (QID) | INTRAMUSCULAR | Status: DC | PRN
  Administered 2017-07-04: 12.5 mg via INTRAVENOUS
  Filled 2017-07-04: qty 1

## 2017-07-04 MED ORDER — HYDROMORPHONE HCL 1 MG/ML IJ SOLN: 2.0000 mg | INTRAMUSCULAR | Status: DC | PRN

## 2017-07-04 MED ORDER — MORPHINE SULFATE (PF) 4 MG/ML IV SOLN: 4.0000 mg | INTRAVENOUS | Status: DC | PRN

## 2017-07-04 MED ORDER — TOPIRAMATE 25 MG PO TABS
50.0000 mg | ORAL_TABLET | Freq: Two times a day (BID) | ORAL | Status: DC
  Administered 2017-07-04 – 2017-07-05 (×2): 50 mg via ORAL
  Filled 2017-07-04 (×3): qty 2

## 2017-07-04 MED ORDER — TECHNETIUM TC 99M MEBROFENIN IV KIT
5.4000 | PACK | Freq: Once | INTRAVENOUS | Status: AC | PRN
  Administered 2017-07-04: 5.4 via INTRAVENOUS

## 2017-07-04 MED ORDER — HYDROMORPHONE HCL 1 MG/ML IJ SOLN
2.0000 mg | INTRAMUSCULAR | Status: DC | PRN
  Filled 2017-07-04: qty 2

## 2017-07-04 NOTE — Progress Notes (Signed)
Patient seen and chart reviewed. Non-visualization of the gallbladder on HIDA scan consistent with acute acalculous cholecystitis. Will plan for surgery tomorrow. Full note to follow.

## 2017-07-04 NOTE — Consult Note (Addendum)
Reason for Consult: Abdominal pain   Referring Physician: Fulton Reek, MD  Kathy Long is an 67 y.o. female.   HPI: This healthy 67 year old woman was in her usual state of health until late in the evening of February 2.  After a meal including salad and salmon she developed abdominal discomfort.  This progressed through the night.  Yesterday her pain became severe associated with nausea but no vomiting.  No diarrhea.  She was seen in the emergency department and ultrasound showed evidence of acute cholecystitis with "sludge".  The patient underwent HIDA scan today with nonvisualization of the gallbladder.  She is seen for consideration of cholecystectomy.  The patient has had no previous similar symptoms.  She drinks  less than 1 ounce of alcohol per day.Smoking.  Past surgical history notable for appendectomy in 2008.  Past Medical History:  Diagnosis Date  . Acoustic neuroma (HCC)    left ear  . Cancer (HCC)    melanoma, shoulder  . Depression   . Hyperlipidemia   . Hypertension     Past Surgical History:  Procedure Laterality Date  . APPENDECTOMY  2010  . BACK SURGERY     disectomy lumbar, scar tissue  . COLONOSCOPY     2009 and color gaurd in 2018  . FOOT SURGERY     x 2  . MELANOMA EXCISION Left 2000  . PALATOPLASTY N/A 04/08/2015   Procedure: PALATOPLASTY;  Surgeon: Beverly Gust, MD;  Location: [facility] ORS;  Service: ENT;  Laterality: N/A;  . TONSILLECTOMY N/A 04/08/2015   Procedure: TONSILLECTOMY;  Surgeon: Beverly Gust, MD;  Location: [facility] ORS;  Service: ENT;  Laterality: N/A;  . TUBAL LIGATION      Family History  Problem Relation Age of Onset  . Cancer Sister 40       ovarian ca    Social History:  reports that  has never smoked. she has never used smokeless tobacco. She reports that she drinks alcohol. She reports that she does not use drugs.  Allergies:  Allergies  Allergen Reactions  . Tramadol Other (See Comments)  . Tape Rash    Other  reaction(s): UNKNOWN    Medications: I have reviewed the patient's current medications.  Results for orders placed or performed during the hospital encounter of 07/03/17 (from the past 48 hour(s))  Lipase, blood     Status: None   Collection Time: 07/03/17  3:40 PM  Result Value Ref Range   Lipase 29 11 - 51 U/L    Comment: Performed at [facility], [address] [place], [place] [address]  Comprehensive metabolic panel     Status: Abnormal   Collection Time: 07/03/17  3:40 PM  Result Value Ref Range   Sodium 139 135 - 145 mmol/L   Potassium 3.6 3.5 - 5.1 mmol/L   Chloride 105 101 - 111 mmol/L   CO2 23 22 - 32 mmol/L   Glucose, Bld 98 65 - 99 mg/dL   BUN 18 6 - 20 mg/dL   Creatinine, Ser 1.20 (H) 0.44 - 1.00 mg/dL   Calcium 9.7 8.9 - 10.3 mg/dL   Total Protein 8.0 6.5 - 8.1 g/dL   Albumin 4.6 3.5 - 5.0 g/dL   AST 29 15 - 41 U/L   ALT 21 14 - 54 U/L   Alkaline Phosphatase 96 38 - 126 U/L   Total Bilirubin 1.4 (H) 0.3 - 1.2 mg/dL   GFR calc non Af Amer 45 (L) >60 mL/min  GFR calc Af Amer 52 (L) >60 mL/min    Comment: (NOTE) The eGFR has been calculated using the CKD EPI equation. This calculation has not been validated in all clinical situations. eGFR's persistently <60 mL/min signify possible Chronic Kidney Disease.    Anion gap 11 5 - 15    Comment: Performed at [facility], [address] [place], [place] [address]  CBC     Status: None   Collection Time: 07/03/17  3:40 PM  Result Value Ref Range   WBC 10.3 3.6 - 11.0 K/uL   RBC 5.11 3.80 - 5.20 MIL/uL   Hemoglobin 15.9 12.0 - 16.0 g/dL   HCT 46.9 35.0 - 47.0 %   MCV 91.9 80.0 - 100.0 fL   MCH 31.2 26.0 - 34.0 pg   MCHC 34.0 32.0 - 36.0 g/dL   RDW 13.0 11.5 - 14.5 %   Platelets 231 150 - 440 K/uL    Comment: Performed at [facility], [address] [place], [place] [address]  Urinalysis, Complete w Microscopic     Status: Abnormal   Collection Time: 07/03/17  3:40 PM  Result  Value Ref Range   Color, Urine YELLOW (A) YELLOW   APPearance HAZY (A) CLEAR   Specific Gravity, Urine 1.025 1.005 - 1.030   pH 5.0 5.0 - 8.0   Glucose, UA NEGATIVE NEGATIVE mg/dL   Hgb urine dipstick NEGATIVE NEGATIVE   Bilirubin Urine NEGATIVE NEGATIVE   Ketones, ur NEGATIVE NEGATIVE mg/dL   Protein, ur 30 (A) NEGATIVE mg/dL   Nitrite NEGATIVE NEGATIVE   Leukocytes, UA NEGATIVE NEGATIVE   RBC / HPF 0-5 0 - 5 RBC/hpf   WBC, UA 0-5 0 - 5 WBC/hpf   Bacteria, UA NONE SEEN NONE SEEN   Squamous Epithelial / LPF 0-5 (A) NONE SEEN   Mucus PRESENT     Comment: Performed at [facility], [address] [place], [place] [address]  CBC     Status: None   Collection Time: 07/04/17  4:21 AM  Result Value Ref Range   WBC 9.4 3.6 - 11.0 K/uL   RBC 4.49 3.80 - 5.20 MIL/uL   Hemoglobin 14.0 12.0 - 16.0 g/dL   HCT 41.8 35.0 - 47.0 %   MCV 93.0 80.0 - 100.0 fL   MCH 31.2 26.0 - 34.0 pg   MCHC 33.5 32.0 - 36.0 g/dL   RDW 13.3 11.5 - 14.5 %   Platelets 183 150 - 440 K/uL    Comment: Performed at [facility], [address] [place], [place] [address]  Comprehensive metabolic panel     Status: Abnormal   Collection Time: 07/04/17  4:21 AM  Result Value Ref Range   Sodium 141 135 - 145 mmol/L   Potassium 3.8 3.5 - 5.1 mmol/L   Chloride 107 101 - 111 mmol/L   CO2 25 22 - 32 mmol/L   Glucose, Bld 124 (H) 65 - 99 mg/dL   BUN 14 6 - 20 mg/dL   Creatinine, Ser 0.98 0.44 - 1.00 mg/dL   Calcium 9.1 8.9 - 10.3 mg/dL   Total Protein 7.2 6.5 - 8.1 g/dL   Albumin 4.2 3.5 - 5.0 g/dL   AST 174 (H) 15 - 41 U/L   ALT 157 (H) 14 - 54 U/L   Alkaline Phosphatase 188 (H) 38 - 126 U/L   Total Bilirubin 1.5 (H) 0.3 - 1.2 mg/dL   GFR calc non Af Amer 58 (L) >60 mL/min  GFR calc Af Amer >60 >60 mL/min    Comment: (NOTE) The eGFR has been calculated using the CKD EPI equation. This calculation has not been validated in all clinical situations. eGFR's persistently <60 mL/min signify  possible Chronic Kidney Disease.    Anion gap 9 5 - 15    Comment: Performed at [facility], [place], [place] [address]    Nm Hepato W/eject Fract  Result Date: 07/04/2017 CLINICAL DATA:  Right upper quadrant pain EXAM: NUCLEAR MEDICINE HEPATOBILIARY IMAGING VIEWS: Anterior right upper quadrant obtained serially over 4 hours. RADIOPHARMACEUTICALS:  5.4 mCi Tc-31m Choletec IV COMPARISON:  Ultrasound right upper quadrant July 03, 2017 FINDINGS: Liver uptake is normal. There is prompt visualization of small bowel, indicating patency of the common bile duct. Images obtained over a 4 hour time span show nonvisualization of the gallbladder. IMPRESSION: Nonvisualization of gallbladder over 4 hours. This finding is felt to be indicative of cystic duct obstruction and acute cholecystitis. Small bowel visualizes promptly indicating patency of the common bile duct. Electronically Signed   By: WLowella GripIII M.D.   On: 07/04/2017 17:01   UKoreaAbdomen Limited Ruq  Result Date: 07/03/2017 CLINICAL DATA:  Upper abdominal pain for 1 day.  Nausea. EXAM: ULTRASOUND ABDOMEN LIMITED RIGHT UPPER QUADRANT COMPARISON:  None. FINDINGS: Gallbladder: The gallbladder wall measures 3 mm which is borderline. There is a small amount of pericholecystic fluid. No Murphy's sign or stones. Sludge is identified. Common bile duct: Diameter: 6.2 mm Liver: There is a small 8 mm cyst in the right hepatic lobe. Portal vein is patent on color Doppler imaging with normal direction of blood flow towards the liver. IMPRESSION: 1. The gallbladder wall is borderline in thickness measuring 3 mm. There is a small amount of pericholecystic fluid and sludge. No stones or Murphy's sign identified. These findings are not specific. A HIDA scan could further evaluate for acute cholecystitis if clinically warranted. 2. The common bile duct is borderline in caliber. Recommend correlation with LFTs. 3. Right hepatic lobe cyst  measuring 8 mm. Electronically Signed   By: DDorise BullionIII M.D   On: 07/03/2017 18:36    Review of Systems  Constitutional: Negative.   HENT: Negative.   Eyes: Negative.   Respiratory: Negative.   Cardiovascular: Positive for chest pain (With morphine administration in the emergency department).  Gastrointestinal: Positive for abdominal pain and nausea. Negative for vomiting.  Genitourinary: Negative.   Musculoskeletal: Negative.   Skin: Negative.   Neurological: Negative.   Endo/Heme/Allergies: Negative.   Psychiatric/Behavioral: Negative.    Blood pressure 135/72, pulse 68, temperature 99.8 F (37.7 C), temperature source Oral, resp. rate 16, height 5' 2"  (1.575 m), weight 165 lb 9.1 oz (75.1 kg), SpO2 93 %. Physical Exam  Constitutional: She is oriented to person, place, and time. She appears well-developed and well-nourished.  HENT:  Head: Normocephalic and atraumatic.  Eyes: Pupils are equal, round, and reactive to light. No scleral icterus.  Neck: Neck supple. No thyromegaly present.  Cardiovascular: Normal rate and regular rhythm.  Respiratory: Effort normal and breath sounds normal.  GI: Bowel sounds are normal. She exhibits no distension and no mass. There is no tenderness. There is no rebound and no guarding.    Musculoskeletal: Normal range of motion.  Neurological: She is alert and oriented to person, place, and time. She has normal reflexes.  Skin: Skin is warm and dry.  Psychiatric: She has a normal mood and affect. Her behavior  is normal. Judgment and thought content normal.    Assessment/Plan: Acute acalculous cholecystitis  Options for management reviewed: Observation versus early cholecystectomy.  Laparoscopic with possibility of open procedure discussed.  We will plan for cholecystectomy tomorrow.  Forest Gleason Raynald Rouillard 07/04/2017, 8:07 PM

## 2017-07-04 NOTE — Progress Notes (Signed)
Patient will review the AD and decide whether to complete the document in tandem with a pre-existing living will.  Regardless, they do not have AD in the system at this time.

## 2017-07-04 NOTE — Care Management Obs Status (Signed)
[facility] NOTIFICATION   Patient Details  Name: Kathy Long MRN: [id number] Date of Birth: May 12, 1951   Medicare Observation Status Notification Given:  No(Patient off the floor.  Will readmit at a later time )    Beverly Sessions, RN 07/04/2017, 4:36 PM

## 2017-07-04 NOTE — Progress Notes (Signed)
Kathy Long: Kathy Long    MR#:  [id number]  DATE OF BIRTH:  [birth year]  SUBJECTIVE:  CHIEF COMPLAINT:   Chief Complaint  Patient presents with  . Abdominal Pain  Patient continues to complain of right upper quadrant pain-pain management is inadequate, continue nausea-Zofran not effective, as well as at the bedside, for a HIDA scan later today, general surgery to see  REVIEW OF SYSTEMS:  CONSTITUTIONAL: No fever, fatigue or weakness.  EYES: No blurred or double vision.  EARS, NOSE, AND THROAT: No tinnitus or ear pain.  RESPIRATORY: No cough, shortness of breath, wheezing or hemoptysis.  CARDIOVASCULAR: No chest pain, orthopnea, edema.  GASTROINTESTINAL: No nausea, vomiting, diarrhea or abdominal pain.  GENITOURINARY: No dysuria, hematuria.  ENDOCRINE: No polyuria, nocturia,  HEMATOLOGY: No anemia, easy bruising or bleeding SKIN: No rash or lesion. MUSCULOSKELETAL: No joint pain or arthritis.   NEUROLOGIC: No tingling, numbness, weakness.  PSYCHIATRY: No anxiety or depression.   ROS  DRUG ALLERGIES:   Allergies  Allergen Reactions  . Tramadol Other (See Comments)  . Tape Rash    Other reaction(s): UNKNOWN    VITALS:  Blood pressure (!) 154/76, pulse (!) 58, temperature 98.6 F (37 C), temperature source Oral, resp. rate 18, height 5\' 2"  (1.575 m), weight 75.1 kg (165 lb 9.1 oz), SpO2 93 %.  PHYSICAL EXAMINATION:  GENERAL:  67 y.o.-year-old patient lying in the bed with no acute distress.  EYES: Pupils equal, round, reactive to light and accommodation. No scleral icterus. Extraocular muscles intact.  HEENT: Head atraumatic, normocephalic. Oropharynx and nasopharynx clear.  NECK:  Supple, no jugular venous distention. No thyroid enlargement, no tenderness.  LUNGS: Normal breath sounds bilaterally, no wheezing, rales,rhonchi or crepitation. No use of accessory muscles of respiration.  CARDIOVASCULAR: S1, S2 normal.  No murmurs, rubs, or gallops.  ABDOMEN: Positive Murphy sign, right upper quadrant tenderness, no rebound/guarding. Bowel sounds present. No organomegaly or mass.  EXTREMITIES: No pedal edema, cyanosis, or clubbing.  NEUROLOGIC: Cranial nerves II through XII are intact. MAES. Gait not checked.  PSYCHIATRIC: The patient is alert and oriented x 3.  SKIN: No obvious rash, lesion, or ulcer.   Physical Exam LABORATORY PANEL:   CBC Recent Labs  Lab 07/04/17 0421  WBC 9.4  HGB 14.0  HCT 41.8  PLT 183   ------------------------------------------------------------------------------------------------------------------  Chemistries  Recent Labs  Lab 07/04/17 0421  NA 141  K 3.8  CL 107  CO2 25  GLUCOSE 124*  BUN 14  CREATININE 0.98  CALCIUM 9.1  AST 174*  ALT 157*  ALKPHOS 188*  BILITOT 1.5*   ------------------------------------------------------------------------------------------------------------------  Cardiac Enzymes No results for input(s): TROPONINI in the last 168 hours. ------------------------------------------------------------------------------------------------------------------  RADIOLOGY:  US Abdomen Limited Ruq  Result Date: 07/03/2017 CLINICAL DATA:  Upper abdominal pain for 1 day.  Nausea. EXAM: ULTRASOUND ABDOMEN LIMITED RIGHT UPPER QUADRANT COMPARISON:  None. FINDINGS: Gallbladder: The gallbladder wall measures 3 mm which is borderline. There is a small amount of pericholecystic fluid. No Murphy's sign or stones. Sludge is identified. Common bile duct: Diameter: 6.2 mm Liver: There is a small 8 mm cyst in the right hepatic lobe. Portal vein is patent on color Doppler imaging with normal direction of blood flow towards the liver. IMPRESSION: 1. The gallbladder wall is borderline in thickness measuring 3 mm. There is a small amount of pericholecystic fluid and sludge. No stones or Murphy's sign identified. These findings are not specific. A  HIDA scan could  further evaluate for acute cholecystitis if clinically warranted. 2. The common bile duct is borderline in caliber. Recommend correlation with LFTs. 3. Right hepatic lobe cyst measuring 8 mm. Electronically Signed   By: Dorise Bullion III M.D   On: 07/03/2017 18:36    ASSESSMENT AND PLAN:  1 acute right upper quadrant tenderness Noted worsening hepatic dysfunction/transaminitis, abnormal ultrasound of the gallbladder, positive Murphy sign on examination, right upper quadrant tenderness on palpation, for HIDA scan, general surgery to see, adult pain protocol, antiemetics PRN, continue close medical monitoring  2 acute nausea Most likely secondary to acute cholecystitis Phenergan as needed  3 chronic benign essential hypertension Stable with current regiment  4 chronic GAD, unspecified Stable Continue home regiment  5 chronic GERD without esophagitis PPI IV twice daily    All the records are reviewed and case discussed with Care Management/Social Workerr. Management plans discussed with the patient, family and they are in agreement.  CODE STATUS: full  TOTAL TIME TAKING CARE OF THIS PATIENT: 35 minutes.     POSSIBLE D/C IN 1-2 DAYS, DEPENDING ON CLINICAL CONDITION.   Kathy Long M.D on 07/04/2017   Between 7am to 6pm - Pager - [phone]  After 6pm go to [email or web address] - password [email or web address] [facility]  Office  [phone]  CC: Primary care physician; Kathy Mc, MD  Note: This dictation was prepared with Dragon dictation along with smaller phrase technology. Any transcriptional errors that result from this process are unintentional.

## 2017-07-04 NOTE — Plan of Care (Signed)
Pt NPO today for procedure. Pt's pain tolerable with Tylenol earlier in the day. Pt exhibited some vomiting a small amount of bile. Pt went down for HIDA scan twice today/waiting on results.

## 2017-07-05 ENCOUNTER — Observation Stay: Payer: Medicare Other

## 2017-07-05 ENCOUNTER — Observation Stay: Payer: Medicare Other | Admitting: Anesthesiology

## 2017-07-05 ENCOUNTER — Encounter
Admission: EM | Disposition: A | Discharge status: Home / Self Care | Payer: Self-pay | Source: Home / Self Care | Attending: Emergency Medicine

## 2017-07-05 DIAGNOSIS — Z9851 Tubal ligation status: Secondary | ICD-10-CM

## 2017-07-05 DIAGNOSIS — Z9049 Acquired absence of other specified parts of digestive tract: Secondary | ICD-10-CM

## 2017-07-05 DIAGNOSIS — E860 Dehydration: Secondary | ICD-10-CM | POA: Diagnosis present

## 2017-07-05 DIAGNOSIS — I10 Essential (primary) hypertension: Secondary | ICD-10-CM | POA: Diagnosis present

## 2017-07-05 DIAGNOSIS — R197 Diarrhea, unspecified: Secondary | ICD-10-CM | POA: Diagnosis not present

## 2017-07-05 DIAGNOSIS — E785 Hyperlipidemia, unspecified: Secondary | ICD-10-CM | POA: Diagnosis present

## 2017-07-05 DIAGNOSIS — K8000 Calculus of gallbladder with acute cholecystitis without obstruction: Secondary | ICD-10-CM | POA: Diagnosis not present

## 2017-07-05 DIAGNOSIS — Z79899 Other long term (current) drug therapy: Secondary | ICD-10-CM

## 2017-07-05 DIAGNOSIS — Z885 Allergy status to narcotic agent status: Secondary | ICD-10-CM

## 2017-07-05 DIAGNOSIS — Z85820 Personal history of malignant melanoma of skin: Secondary | ICD-10-CM

## 2017-07-05 DIAGNOSIS — K9189 Other postprocedural complications and disorders of digestive system: Principal | ICD-10-CM | POA: Diagnosis present

## 2017-07-05 DIAGNOSIS — Y838 Other surgical procedures as the cause of abnormal reaction of the patient, or of later complication, without mention of misadventure at the time of the procedure: Secondary | ICD-10-CM | POA: Diagnosis present

## 2017-07-05 DIAGNOSIS — Z91048 Other nonmedicinal substance allergy status: Secondary | ICD-10-CM

## 2017-07-05 DIAGNOSIS — Z8041 Family history of malignant neoplasm of ovary: Secondary | ICD-10-CM

## 2017-07-05 HISTORY — PX: CHOLECYSTECTOMY: SHX55

## 2017-07-05 LAB — COMPREHENSIVE METABOLIC PANEL
ALT: 90 U/L — ABNORMAL HIGH (ref 14–54)
AST: 61 U/L — ABNORMAL HIGH (ref 15–41)
Albumin: 3.8 g/dL (ref 3.5–5.0)
Alkaline Phosphatase: 137 U/L — ABNORMAL HIGH (ref 38–126)
Anion gap: 8 (ref 5–15)
BUN: 13 mg/dL (ref 6–20)
CO2: 24 mmol/L (ref 22–32)
Calcium: 8.6 mg/dL — ABNORMAL LOW (ref 8.9–10.3)
Chloride: 108 mmol/L (ref 101–111)
Creatinine, Ser: 0.98 mg/dL (ref 0.44–1.00)
GFR calc Af Amer: >60 mL/min (ref >60)
GFR calc non Af Amer: 58 mL/min — ABNORMAL LOW (ref >60)
Glucose, Bld: 116 mg/dL — ABNORMAL HIGH (ref 65–99)
Potassium: 3.9 mmol/L (ref 3.5–5.1)
Sodium: 140 mmol/L (ref 135–145)
Total Bilirubin: 2.9 mg/dL — ABNORMAL HIGH (ref 0.3–1.2)
Total Protein: 6.8 g/dL (ref 6.5–8.1)

## 2017-07-05 LAB — MRSA PCR SCREENING: MRSA by PCR: NEGATIVE

## 2017-07-05 LAB — BILIRUBIN, DIRECT: Bilirubin, Direct: 0.4 mg/dL (ref 0.1–0.5)

## 2017-07-05 LAB — CBC WITH DIFFERENTIAL/PLATELET
Basophils Absolute: 0 10*3/uL (ref 0–0.1)
Basophils Relative: 0 %
Eosinophils Absolute: 0 10*3/uL (ref 0–0.7)
Eosinophils Relative: 0 %
HCT: 40.5 % (ref 35.0–47.0)
Hemoglobin: 13.4 g/dL (ref 12.0–16.0)
Lymphocytes Relative: 6 %
Lymphs Abs: 0.8 10*3/uL — ABNORMAL LOW (ref 1.0–3.6)
MCH: 30.9 pg (ref 26.0–34.0)
MCHC: 33.2 g/dL (ref 32.0–36.0)
MCV: 93 fL (ref 80.0–100.0)
Monocytes Absolute: 1.6 10*3/uL — ABNORMAL HIGH (ref 0.2–0.9)
Monocytes Relative: 12 %
Neutro Abs: 11 10*3/uL — ABNORMAL HIGH (ref 1.4–6.5)
Neutrophils Relative %: 82 %
Platelets: 177 10*3/uL (ref 150–440)
RBC: 4.35 MIL/uL (ref 3.80–5.20)
RDW: 13.6 % (ref 11.5–14.5)
WBC: 13.4 10*3/uL — ABNORMAL HIGH (ref 3.6–11.0)

## 2017-07-05 SURGERY — LAPAROSCOPIC CHOLECYSTECTOMY WITH INTRAOPERATIVE CHOLANGIOGRAM
Anesthesia: General | Wound class: Clean Contaminated

## 2017-07-05 MED ORDER — KETOROLAC TROMETHAMINE 30 MG/ML IJ SOLN
30.0000 mg | Freq: Once | INTRAMUSCULAR | Status: AC
  Administered 2017-07-05: 30 mg via INTRAVENOUS

## 2017-07-05 MED ORDER — KETOROLAC TROMETHAMINE 30 MG/ML IJ SOLN
INTRAMUSCULAR | Status: AC
  Filled 2017-07-05: qty 1

## 2017-07-05 MED ORDER — PROPOFOL 10 MG/ML IV BOLUS
INTRAVENOUS | Status: DC | PRN
  Administered 2017-07-05: 150 mg via INTRAVENOUS

## 2017-07-05 MED ORDER — LIDOCAINE HCL (CARDIAC) 20 MG/ML IV SOLN
INTRAVENOUS | Status: DC | PRN
  Administered 2017-07-05: 100 mg via INTRAVENOUS

## 2017-07-05 MED ORDER — MIDAZOLAM HCL 2 MG/2ML IJ SOLN
INTRAMUSCULAR | Status: DC | PRN
  Administered 2017-07-05: 2 mg via INTRAVENOUS

## 2017-07-05 MED ORDER — FENTANYL CITRATE (PF) 100 MCG/2ML IJ SOLN
INTRAMUSCULAR | Status: AC
  Filled 2017-07-05: qty 2

## 2017-07-05 MED ORDER — ROCURONIUM BROMIDE 100 MG/10ML IV SOLN
INTRAVENOUS | Status: DC | PRN
  Administered 2017-07-05: 10 mg via INTRAVENOUS
  Administered 2017-07-05: 40 mg via INTRAVENOUS

## 2017-07-05 MED ORDER — MEPERIDINE HCL 25 MG/ML IJ SOLN
25.0000 mg | INTRAMUSCULAR | Status: DC | PRN
  Administered 2017-07-05 – 2017-07-06 (×2): 25 mg via INTRAVENOUS
  Filled 2017-07-05 (×2): qty 1

## 2017-07-05 MED ORDER — SODIUM CHLORIDE 0.9 % IJ SOLN
INTRAMUSCULAR | Status: AC
  Filled 2017-07-05: qty 50

## 2017-07-05 MED ORDER — SUGAMMADEX SODIUM 200 MG/2ML IV SOLN
INTRAVENOUS | Status: AC
  Filled 2017-07-05: qty 2

## 2017-07-05 MED ORDER — SUGAMMADEX SODIUM 200 MG/2ML IV SOLN
INTRAVENOUS | Status: DC | PRN
  Administered 2017-07-05: 150 mg via INTRAVENOUS

## 2017-07-05 MED ORDER — ONDANSETRON HCL 4 MG/2ML IJ SOLN: 4.0000 mg | Freq: Once | INTRAMUSCULAR | Status: DC | PRN

## 2017-07-05 MED ORDER — FENTANYL CITRATE (PF) 100 MCG/2ML IJ SOLN
INTRAMUSCULAR | Status: DC | PRN
  Administered 2017-07-05 (×2): 50 ug via INTRAVENOUS

## 2017-07-05 MED ORDER — FENTANYL CITRATE (PF) 100 MCG/2ML IJ SOLN: 25.0000 ug | INTRAMUSCULAR | Status: DC | PRN

## 2017-07-05 MED ORDER — HYDROCODONE-ACETAMINOPHEN 5-325 MG PO TABS
1.0000 | ORAL_TABLET | ORAL | Status: DC | PRN
  Administered 2017-07-05: 2 via ORAL
  Administered 2017-07-05: 1 via ORAL
  Administered 2017-07-06 (×2): 2 via ORAL
  Filled 2017-07-05: qty 2
  Filled 2017-07-05: qty 1
  Filled 2017-07-05 (×2): qty 2

## 2017-07-05 MED ORDER — ACETAMINOPHEN 10 MG/ML IV SOLN
INTRAVENOUS | Status: DC | PRN
  Administered 2017-07-05: 1000 mg via INTRAVENOUS

## 2017-07-05 MED ORDER — CEFAZOLIN SODIUM-DEXTROSE 2-3 GM-%(50ML) IV SOLR
INTRAVENOUS | Status: DC | PRN
  Administered 2017-07-05: 2 g via INTRAVENOUS

## 2017-07-05 MED ORDER — LACTATED RINGERS IV SOLN
INTRAVENOUS | Status: DC
  Administered 2017-07-05: 12:00:00 via INTRAVENOUS

## 2017-07-05 MED ORDER — SODIUM CHLORIDE 0.9 % IV SOLN
INTRAVENOUS | Status: DC | PRN
  Administered 2017-07-05: 37 mL

## 2017-07-05 MED ORDER — CEFAZOLIN SODIUM 1 G IJ SOLR
INTRAMUSCULAR | Status: AC
  Filled 2017-07-05: qty 20

## 2017-07-05 MED ORDER — ONDANSETRON HCL 4 MG/2ML IJ SOLN
INTRAMUSCULAR | Status: DC | PRN
  Administered 2017-07-05: 4 mg via INTRAVENOUS

## 2017-07-05 MED ORDER — MIDAZOLAM HCL 2 MG/2ML IJ SOLN
INTRAMUSCULAR | Status: AC
  Filled 2017-07-05: qty 2

## 2017-07-05 MED ORDER — DEXAMETHASONE SODIUM PHOSPHATE 10 MG/ML IJ SOLN
INTRAMUSCULAR | Status: DC | PRN
  Administered 2017-07-05: 10 mg via INTRAVENOUS

## 2017-07-05 MED ORDER — PROMETHAZINE HCL 25 MG/ML IJ SOLN: 12.5000 mg | INTRAMUSCULAR | Status: DC | PRN

## 2017-07-05 MED ORDER — ACETAMINOPHEN 10 MG/ML IV SOLN
INTRAVENOUS | Status: AC
Start: 2017-07-05 — End: 2017-07-05
  Filled 2017-07-05: qty 100

## 2017-07-05 SURGICAL SUPPLY — 41 items
APPLIER CLIP ROT 10 11.4 M/L (STAPLE) ×2
BLADE SURG 11 STRL SS SAFETY (MISCELLANEOUS) ×2 IMPLANT
CANISTER SUCT 1200ML W/VALVE (MISCELLANEOUS) ×2 IMPLANT
CANNULA DILATOR 10 W/SLV (CANNULA) ×2 IMPLANT
CANNULA DILATOR 5 W/SLV (CANNULA) ×4 IMPLANT
CATH CHOLANG 76X19 KUMAR (CATHETERS) ×2 IMPLANT
CHLORAPREP W/TINT 26ML (MISCELLANEOUS) ×2 IMPLANT
CLIP APPLIE ROT 10 11.4 M/L (STAPLE) ×1 IMPLANT
CONRAY 60ML FOR OR (MISCELLANEOUS) ×2 IMPLANT
DISSECTOR KITTNER STICK (MISCELLANEOUS) ×1 IMPLANT
DISSECTORS/KITTNER STICK (MISCELLANEOUS) ×2
DRAPE LAPAROTOMY 100X77 ABD (DRAPES) ×2 IMPLANT
DRAPE SHEET LG 3/4 BI-LAMINATE (DRAPES) ×2 IMPLANT
DRSG TEGADERM 2-3/8X2-3/4 SM (GAUZE/BANDAGES/DRESSINGS) ×8 IMPLANT
DRSG TELFA 4X3 1S NADH ST (GAUZE/BANDAGES/DRESSINGS) ×2 IMPLANT
ELECT REM PT RETURN 9FT ADLT (ELECTROSURGICAL) ×2
ELECTRODE REM PT RTRN 9FT ADLT (ELECTROSURGICAL) ×1 IMPLANT
GLOVE BIO SURGEON STRL SZ7.5 (GLOVE) ×2 IMPLANT
GLOVE BIOGEL PI IND STRL 7.0 (GLOVE) ×4 IMPLANT
GLOVE BIOGEL PI INDICATOR 7.0 (GLOVE) ×4
GLOVE INDICATOR 8.0 STRL GRN (GLOVE) ×2 IMPLANT
GOWN STRL REUS W/ TWL LRG LVL3 (GOWN DISPOSABLE) ×4 IMPLANT
GOWN STRL REUS W/TWL LRG LVL3 (GOWN DISPOSABLE) ×4
IRRIGATION STRYKERFLOW (MISCELLANEOUS) ×1 IMPLANT
IRRIGATOR STRYKERFLOW (MISCELLANEOUS) ×2
IV LACTATED RINGERS 1000ML (IV SOLUTION) ×2 IMPLANT
KIT TURNOVER KIT A (KITS) ×2 IMPLANT
LABEL OR SOLS (LABEL) ×2 IMPLANT
NDL INSUFF ACCESS 14 VERSASTEP (NEEDLE) ×2 IMPLANT
NS IRRIG 500ML POUR BTL (IV SOLUTION) ×2 IMPLANT
PACK LAP CHOLECYSTECTOMY (MISCELLANEOUS) ×2 IMPLANT
POUCH SPECIMEN RETRIEVAL 10MM (ENDOMECHANICALS) ×2 IMPLANT
SCISSORS METZENBAUM CVD 33 (INSTRUMENTS) ×2 IMPLANT
STRIP CLOSURE SKIN 1/2X4 (GAUZE/BANDAGES/DRESSINGS) ×2 IMPLANT
SUT MAXON ABS #0 GS21 30IN (SUTURE) ×2 IMPLANT
SUT VIC AB 0 CT2 27 (SUTURE) ×2 IMPLANT
SUT VIC AB 4-0 FS2 27 (SUTURE) ×2 IMPLANT
SWABSTK COMLB BENZOIN TINCTURE (MISCELLANEOUS) ×2 IMPLANT
TROCAR XCEL NON-BLD 11X100MML (ENDOMECHANICALS) ×2 IMPLANT
TUBING INSUFFLATION (TUBING) ×2 IMPLANT
WATER STERILE IRR 1000ML POUR (IV SOLUTION) ×2 IMPLANT

## 2017-07-05 NOTE — Anesthesia Post-op Follow-up Note (Signed)
Anesthesia QCDR form completed.        

## 2017-07-05 NOTE — Progress Notes (Signed)
[place] [facility] [place] NAME: Demetrius Barrell    MR#:  [id number]  DATE OF BIRTH:  [birth year]  SUBJECTIVE:  CHIEF COMPLAINT:   Chief Complaint  Patient presents with  . Abdominal Pain  For cholecystectomy later today  REVIEW OF SYSTEMS:  CONSTITUTIONAL: No fever, fatigue or weakness.  EYES: No blurred or double vision.  EARS, NOSE, AND THROAT: No tinnitus or ear pain.  RESPIRATORY: No cough, shortness of breath, wheezing or hemoptysis.  CARDIOVASCULAR: No chest pain, orthopnea, edema.  GASTROINTESTINAL: No nausea, vomiting, diarrhea or abdominal pain.  GENITOURINARY: No dysuria, hematuria.  ENDOCRINE: No polyuria, nocturia,  HEMATOLOGY: No anemia, easy bruising or bleeding SKIN: No rash or lesion. MUSCULOSKELETAL: No joint pain or arthritis.   NEUROLOGIC: No tingling, numbness, weakness.  PSYCHIATRY: No anxiety or depression.   ROS  DRUG ALLERGIES:   Allergies  Allergen Reactions  . Morphine And Related Other (See Comments)    Chest pain  . Tramadol Other (See Comments)  . Tape Rash    Other reaction(s): UNKNOWN    VITALS:  Blood pressure (!) 110/51, pulse 69, temperature 99.2 F (37.3 C), resp. rate (!) 21, height 5\' 2"  (1.575 m), weight 74.8 kg (165 lb), SpO2 94 %.  PHYSICAL EXAMINATION:  GENERAL:  67 y.o.-year-old patient lying in the bed with no acute distress.  EYES: Pupils equal, round, reactive to light and accommodation. No scleral icterus. Extraocular muscles intact.  HEENT: Head atraumatic, normocephalic. Oropharynx and nasopharynx clear.  NECK:  Supple, no jugular venous distention. No thyroid enlargement, no tenderness.  LUNGS: Normal breath sounds bilaterally, no wheezing, rales,rhonchi or crepitation. No use of accessory muscles of respiration.  CARDIOVASCULAR: S1, S2 normal. No murmurs, rubs, or gallops.  ABDOMEN: Soft, nontender, nondistended. Bowel sounds present. No organomegaly or mass.  EXTREMITIES: No pedal  edema, cyanosis, or clubbing.  NEUROLOGIC: Cranial nerves II through XII are intact. Muscle strength 5/5 in all extremities. Sensation intact. Gait not checked.  PSYCHIATRIC: The patient is alert and oriented x 3.  SKIN: No obvious rash, lesion, or ulcer.   Physical Exam LABORATORY PANEL:   CBC Recent Labs  Lab 07/05/17 0317  WBC 13.4*  HGB 13.4  HCT 40.5  PLT 177   ------------------------------------------------------------------------------------------------------------------  Chemistries  Recent Labs  Lab 07/05/17 0317  NA 140  K 3.9  CL 108  CO2 24  GLUCOSE 116*  BUN 13  CREATININE 0.98  CALCIUM 8.6*  AST 61*  ALT 90*  ALKPHOS 137*  BILITOT 2.9*   ------------------------------------------------------------------------------------------------------------------  Cardiac Enzymes No results for input(s): TROPONINI in the last 168 hours. ------------------------------------------------------------------------------------------------------------------  RADIOLOGY:  Dg Cholangiogram Operative  Result Date: 07/05/2017 CLINICAL DATA:  Intraoperative cholangiogram during laparoscopic cholecystectomy. EXAM: INTRAOPERATIVE CHOLANGIOGRAM FLUOROSCOPY TIME:  29 seconds COMPARISON:  Nuclear medicine HIDA scan - 07/04/2017; right upper quadrant abdominal ultrasound - 07/03/2017 FINDINGS: Intraoperative cholangiographic images of the right upper abdominal quadrant during laparoscopic cholecystectomy are provided for review. Contrast injection demonstrates selective cannulation of the central aspect of the cystic duct. There is passage of contrast through the central aspect of the cystic duct with filling of a moderately dilated common bile duct. There is eventual passage of contrast through the distal aspect of the CBD with opacification of the duodenum. The distal aspect of the CBD appears somewhat tapered without discrete intraluminal filling defect. There is reflux of contrast into  the gallbladder fossa with several ill-defined filling defects favored to represent a combination of cholelithiasis and  biliary sludge. There are no discrete filling defects within the opacified portions of the biliary system to suggest the presence of choledocholithiasis. IMPRESSION: 1. Cholelithiasis without evidence of choledocholithiasis. 2. Tapered narrowing of the distal aspect of the CBD without discrete intraluminal filling defect to suggest choledocholithiasis. Findings are nonspecific though could be seen in the setting an ampullary stenosis. Clinical correlation is advised. Further evaluation with ERCP or MRCP could be performed as indicated. Electronically Signed   By: Sandi Mariscal M.D.   On: 07/05/2017 13:42   Nm Hepato W/eject Fract  Result Date: 07/04/2017 CLINICAL DATA:  Right upper quadrant pain EXAM: NUCLEAR MEDICINE HEPATOBILIARY IMAGING VIEWS: Anterior right upper quadrant obtained serially over 4 hours. RADIOPHARMACEUTICALS:  5.4 mCi Tc-41m  Choletec IV COMPARISON:  Ultrasound right upper quadrant July 03, 2017 FINDINGS: Liver uptake is normal. There is prompt visualization of small bowel, indicating patency of the common bile duct. Images obtained over a 4 hour time span show nonvisualization of the gallbladder. IMPRESSION: Nonvisualization of gallbladder over 4 hours. This finding is felt to be indicative of cystic duct obstruction and acute cholecystitis. Small bowel visualizes promptly indicating patency of the common bile duct. Electronically Signed   By: Lowella Grip III M.D.   On: 07/04/2017 17:01   US Abdomen Limited Ruq  Result Date: 07/03/2017 CLINICAL DATA:  Upper abdominal pain for 1 day.  Nausea. EXAM: ULTRASOUND ABDOMEN LIMITED RIGHT UPPER QUADRANT COMPARISON:  None. FINDINGS: Gallbladder: The gallbladder wall measures 3 mm which is borderline. There is a small amount of pericholecystic fluid. No Murphy's sign or stones. Sludge is identified. Common bile duct:  Diameter: 6.2 mm Liver: There is a small 8 mm cyst in the right hepatic lobe. Portal vein is patent on color Doppler imaging with normal direction of blood flow towards the liver. IMPRESSION: 1. The gallbladder wall is borderline in thickness measuring 3 mm. There is a small amount of pericholecystic fluid and sludge. No stones or Murphy's sign identified. These findings are not specific. A HIDA scan could further evaluate for acute cholecystitis if clinically warranted. 2. The common bile duct is borderline in caliber. Recommend correlation with LFTs. 3. Right hepatic lobe cyst measuring 8 mm. Electronically Signed   By: Dorise Bullion III M.D   On: 07/03/2017 18:36    ASSESSMENT AND PLAN:   1 acute cholecystectomy  For resection later today by general surgery  Improved transaminitis, abnormal ultrasound of the gallbladder, positive Murphy sign on examination, right upper quadrant tenderness on palpation, HIDA scan was positive, postop care per surgery  2 acute nausea Resolved Most likely secondary to acute cholecystitis Phenergan as needed  3 chronic benign essential hypertension Stable with current regiment  4 chronic GAD, unspecified Stable Continue home regiment  5 chronic GERD without esophagitis PPI IV twice daily   All the records are reviewed and case discussed with Care Management/Social Workerr. Management plans discussed with the patient, family and they are in agreement.  CODE STATUS: full  TOTAL TIME TAKING CARE OF THIS PATIENT: 35 minutes.     POSSIBLE D/C IN 1-2 DAYS, DEPENDING ON CLINICAL CONDITION.   Avel Peace Infiniti Hoefling M.D on 07/05/2017   Between 7am to 6pm - Pager - [phone]  After 6pm go to [email or web address] - password [email or web address] [facility]  Office  [phone]  CC: Primary care physician; [name], MD  Note: This dictation was prepared with Dragon dictation along with smaller phrase technology. Any transcriptional errors  that result  from this process are unintentional.

## 2017-07-05 NOTE — Op Note (Signed)
Preoperative diagnosis: Acute cholecystitis.  Postoperative diagnosis: Same.  Operative procedure: Laparoscopic cholecystectomy with intraoperative cholangiograms.  Operating Surgeon: Hervey Ard, MD.  Anesthesia: General endotracheal.  Estimated blood loss: 20 cc.  Clinical note: This 67 year old woman presented with the sudden onset of upper abdominal pain.  Ultrasound showed evidence of sludge and thickening of the gallbladder wall as well as pericholecystic fluid.  HIDA scan completed yesterday showed nonvisualization of the gallbladder.  She is brought to the operating room for planned cholecystectomy.  She was dosed with Kefzol prior to the procedure.  Operative note: With the patient under adequate general endotracheal anesthesia the abdomen was cleansed with ChloraPrep and draped.  In Trendelenburg position a varies needle was placed through a trans-umbilical incision.  After assuring intra-abdominal location with a hanging drop test the abdomen was insufflated with CO2 at 10 mmHg pressure.  A 10 mm Step port was expanded.  Inspection showed no evidence of injury from initial port placement.  The patient was placed in reverse Trendelenburg position and rolled to the left.  An 11 mm XL port was placed in the epigastrium.  2-5 mm Step ports were placed in the left side of the abdomen in the right lower quadrant.  The gallbladder was tensely distended and beet red in color.  This was decompressed with a needle catheter.  The gallbladder was then placed on cephalad traction and adhesions of the omentum to the undersurface of the gallbladder were taken down with cautery dissection.  The duodenum was firmly adherent near the neck of the gallbladder and this was gently separated with Kitner dissection.  The neck of the gallbladder was cleared and the cystic duct identified.  A Kumar clamp was placed and fluoroscopic cholangiograms completed using 38 cc of one half strength Conray 60.  A long  corkscrew cystic duct was identified with partial filling of the cystic duct /common duct junction and then filling of the distal duct and flow into the duodenum.  Scant visualization of the common hepatic duct.  The cystic duct was doubly clipped and divided.  The anterior and posterior branches of the cystic artery work doubly clipped adjacent to the gallbladder wall.  The gallbladder was then removed from the liver bed making use of hook cautery dissection.  The gallbladder was placed into an Endo Catch bag and then delivered to the umbilical port site.  It was necessary to expand the fascial incision due to the marked gallbladder wall thickness.  After reestablishing pneumoperitoneum the right upper quadrant was irrigated with lactated Ringer solution.  Previously placed clips were intact.  No bleeding from the liver bed.  The right upper quadrant was irrigated and the abdomen then desufflated and ports were removed under direct vision.  The fascial incision was closed with 0 Maxon figure-of-eight sutures x2.  Skin incisions were closed with 4-0 Vicryl subarticular sutures.  Benzoin, Steri-Strips, Telfa and Tegaderm dressings were applied.  The patient tolerated the procedure well and was taken to recovery room in stable condition.

## 2017-07-05 NOTE — Anesthesia Procedure Notes (Signed)
Procedure Name: Intubation Date/Time: 07/05/2017 12:45 PM Performed by: Aline Brochure, CRNA Pre-anesthesia Checklist: Patient identified, Emergency Drugs available, Suction available and Patient being monitored Patient Re-evaluated:Patient Re-evaluated prior to induction Oxygen Delivery Method: Circle system utilized Preoxygenation: Pre-oxygenation with 100% oxygen Induction Type: IV induction Ventilation: Mask ventilation without difficulty Laryngoscope Size: Mac and 3 Grade View: Grade I Tube type: Oral Tube size: 7.0 mm Number of attempts: 1 Airway Equipment and Method: Stylet Placement Confirmation: ETT inserted through vocal cords under direct vision,  positive ETCO2 and breath sounds checked- equal and bilateral Secured at: 21 cm Tube secured with: Tape Dental Injury: Teeth and Oropharynx as per pre-operative assessment

## 2017-07-05 NOTE — Brief Op Note (Signed)
07/05/2017  2:14 PM  PATIENT:  Kathy Long  67 y.o. female  PRE-OPERATIVE DIAGNOSIS:  N/A  POST-OPERATIVE DIAGNOSIS:  n/a   PROCEDURE:  Procedure(s): LAPAROSCOPIC CHOLECYSTECTOMY WITH INTRAOPERATIVE CHOLANGIOGRAM (N/A)  SURGEON:  Surgeon(s) and Role:    * Damiya Sandefur, Forest Gleason, MD - Primary  PHYSICIAN ASSISTANT:   ASSISTANTS: none   ANESTHESIA:   general  EBL:  20 mL   BLOOD ADMINISTERED:none  DRAINS: none   LOCAL MEDICATIONS USED:  NONE  SPECIMEN:  Source of Specimen:  gallbladder  DISPOSITION OF SPECIMEN:  PATHOLOGY  COUNTS:  YES  TOURNIQUET:  * No tourniquets in log *  DICTATION: .Note written in paper chart  PLAN OF CARE: Admit to inpatient   PATIENT DISPOSITION:  PACU - hemodynamically stable.   Delay start of Pharmacological VTE agent (>24hrs) due to surgical blood loss or risk of bleeding: yes

## 2017-07-05 NOTE — Anesthesia Preprocedure Evaluation (Addendum)
Anesthesia Evaluation  Patient identified by MRN, date of birth, ID band Patient awake    Reviewed: Allergy & Precautions, NPO status , Patient's Chart, lab work & pertinent test results, reviewed documented beta blocker date and time   Airway Mallampati: III  TM Distance: >3 FB     Dental  (+) Teeth Intact   Pulmonary neg pulmonary ROS,    Pulmonary exam normal        Cardiovascular hypertension, Pt. on home beta blockers and Pt. on medications Normal cardiovascular exam     Neuro/Psych PSYCHIATRIC DISORDERS Depression  Neuromuscular disease    GI/Hepatic negative GI ROS, Neg liver ROS,   Endo/Other  negative endocrine ROS  Renal/GU negative Renal ROS     Musculoskeletal negative musculoskeletal ROS (+)   Abdominal Normal abdominal exam  (+)   Peds negative pediatric ROS (+)  Hematology negative hematology ROS (+)   Anesthesia Other Findings Morphine and dilaudid causes chest pain.  Demerol and fentanyl are ok. Past Medical History: No date: Acoustic neuroma (HCC)     Comment:  left ear No date: Cancer ([place])     Comment:  melanoma, shoulder No date: Depression No date: Hyperlipidemia No date: Hypertension   Reproductive/Obstetrics                            Anesthesia Physical Anesthesia Plan  ASA: II  Anesthesia Plan: General   Post-op Pain Management:    Induction: Intravenous  PONV Risk Score and Plan:   Airway Management Planned: Oral ETT  Additional Equipment:   Intra-op Plan:   Post-operative Plan: Extubation in OR  Informed Consent: I have reviewed the patients History and Physical, chart, labs and discussed the procedure including the risks, benefits and alternatives for the proposed anesthesia with the patient or authorized representative who has indicated his/her understanding and acceptance.   Dental advisory given  Plan Discussed with: CRNA and  Surgeon  Anesthesia Plan Comments:         Anesthesia Quick Evaluation

## 2017-07-05 NOTE — Progress Notes (Signed)
Afebrile. Pain free. Slept well. HIDA reviewed: Prompt visualization of the small bowel. Labs: Elevated TP, direct pending. Transaminases improved. Reviewed plans for cholecystectomy.

## 2017-07-05 NOTE — Progress Notes (Signed)
Per Dr. Bary Castilla okay for RN to give pt morning medications.

## 2017-07-05 NOTE — Transfer of Care (Signed)
Immediate Anesthesia Transfer of Care Note  Patient: RIHAM POLYAKOV  Procedure(s) Performed: LAPAROSCOPIC CHOLECYSTECTOMY WITH INTRAOPERATIVE CHOLANGIOGRAM (N/A )  Patient Location: PACU  Anesthesia Type:General  Level of Consciousness: sedated  Airway & Oxygen Therapy: Patient connected to face mask oxygen  Post-op Assessment: Post -op Vital signs reviewed and stable  Post vital signs: stable  Last Vitals:  Vitals:   07/05/17 1145 07/05/17 1414  BP: 125/62   Pulse: 66 60  Resp: 18 15  Temp: 37.7 C 37.3 C  SpO2: 97% 96%    Last Pain:  Vitals:   07/05/17 1145  TempSrc: Temporal  PainSc: 2          Complications: No apparent anesthesia complications

## 2017-07-06 ENCOUNTER — Encounter: Payer: Self-pay | Admitting: General Surgery

## 2017-07-06 MED ORDER — HYDROCODONE-ACETAMINOPHEN 5-325 MG PO TABS: 1.0000 | ORAL_TABLET | ORAL | 0 refills | Status: DC | PRN

## 2017-07-06 NOTE — Discharge Summary (Signed)
[facility] NAME: Kathy Long    MR#:  [id number]  DATE OF BIRTH:  Sep 04, 1950  DATE OF ADMISSION:  07/03/2017 ADMITTING PHYSICIAN: Idelle Crouch, MD  DATE OF DISCHARGE: 07/06/2017  PRIMARY CARE PHYSICIAN: [name], MD    ADMISSION DIAGNOSIS:  Cholecystitis, unspecified [K81.9] Upper abdominal pain [R10.10]  DISCHARGE DIAGNOSIS:  Principal Problem:   Abdominal pain Active Problems:   Essential hypertension   Nausea   Abnormal [place] (ultrasound) of abdomen   SECONDARY DIAGNOSIS:   Past Medical History:  Diagnosis Date  . Acoustic neuroma (HCC)    left ear  . Cancer (HCC)    melanoma, shoulder  . Depression   . Hyperlipidemia   . Hypertension     HOSPITAL COURSE:   67 year old female with history of hypertension who presented with abdominal pain.  1. Acute cholecystitis: Ultrasound showed evidence of sludge and thickening of the gallbladder wall. HIDA scan showed nonvisualization of the gallbladder. She was evaluated by surgery services. She underwent cholecystectomy on 07/05/2017. She is doing well status post cholecystectomy. Recommendations are for when necessary pain control. Patient will follow-up with Dr. Tollie Pizza in 1 week.  2. Essential hypertension: Patient will continue Norvasc and metoprolol  3. Hyperlipidemia: Continue statin  4. GERD: Continue PPI  5. Depression: Continue Celexa  DISCHARGE CONDITIONS AND DIET:  Stable for discharge on heart healthy diet  CONSULTS OBTAINED:  Treatment Team:  Robert Bellow, MD  DRUG ALLERGIES:   Allergies  Allergen Reactions  . Morphine And Related Other (See Comments)    Chest pain  . Tramadol Other (See Comments)  . Tape Rash    Other reaction(s): UNKNOWN    DISCHARGE MEDICATIONS:   Allergies as of 07/06/2017      Reactions   Morphine And Related Other (See Comments)   Chest pain   Tramadol Other (See Comments)   Tape Rash   Other  reaction(s): UNKNOWN      Medication List    TAKE these medications   amLODipine 5 MG tablet Commonly known as:  NORVASC Take 1 tablet (5 mg total) by mouth daily. MUST KEEP APPT IN January FOR FURTHER REFILLS   b complex vitamins tablet Take 1 tablet by mouth daily.   Cholecalciferol 1000 units tablet Take 1,000 Units by mouth 2 (two) times daily.   citalopram 20 MG tablet Commonly known as:  CELEXA TAKE ONE TABLET EVERY DAY   HYDROcodone-acetaminophen 5-325 MG tablet Commonly known as:  NORCO/VICODIN Take 1-2 tablets by mouth every 4 (four) hours as needed for moderate pain.   Melatonin 3 MG Caps Take 3 mg by mouth at bedtime.   metoprolol succinate 50 MG 24 hr tablet Commonly known as:  TOPROL-XL TAKE ONE TABLET BY MOUTH EVERY DAY   mirabegron ER 25 MG Tb24 tablet Commonly known as:  MYRBETRIQ Take 1 tablet (25 mg total) by mouth daily.   omeprazole 40 MG capsule Commonly known as:  PRILOSEC TAKE 1 CAPSULE TWICE DAILY NEEDS APPT FOR MORE REFILLS What changed:  See the new instructions.   rosuvastatin 10 MG tablet Commonly known as:  CRESTOR TAKE ONE TABLET EVERY DAY   vitamin E 400 UNIT capsule Take 400 Units by mouth daily.         Today   CHIEF COMPLAINT:  No acute issues overnight. Patient is ready for discharge   VITAL SIGNS:  Blood pressure (!) 169/82, pulse 69, temperature 97.7 F (36.5 C),  temperature source Oral, resp. rate 14, height 5\' 2"  (1.575 m), weight 72.8 kg (160 lb 8 oz), SpO2 91 %.   REVIEW OF SYSTEMS:  Review of Systems  Constitutional: Negative.  Negative for chills, fever and malaise/fatigue.  HENT: Negative.  Negative for ear discharge, ear pain, hearing loss, nosebleeds and sore throat.   Eyes: Negative.  Negative for blurred vision and pain.  Respiratory: Negative.  Negative for cough, hemoptysis, shortness of breath and wheezing.   Cardiovascular: Negative.  Negative for chest pain, palpitations and leg swelling.   Gastrointestinal: Negative.  Negative for abdominal pain (at incision site), blood in stool, diarrhea, nausea and vomiting.  Genitourinary: Negative.  Negative for dysuria.  Musculoskeletal: Negative.  Negative for back pain.  Skin: Negative.   Neurological: Negative for dizziness, tremors, speech change, focal weakness, seizures and headaches.  Endo/Heme/Allergies: Negative.  Does not bruise/bleed easily.  Psychiatric/Behavioral: Negative.  Negative for depression, hallucinations and suicidal ideas.     PHYSICAL EXAMINATION:  GENERAL:  67 y.o.-year-old patient lying in the bed with no acute distress.  NECK:  Supple, no jugular venous distention. No thyroid enlargement, no tenderness.  LUNGS: Normal breath sounds bilaterally, no wheezing, rales,rhonchi  No use of accessory muscles of respiration.  CARDIOVASCULAR: S1, S2 normal. No murmurs, rubs, or gallops.  ABDOMEN: Soft, mild generalized tenderness with incisions without drainage non-distended. Bowel sounds present. No organomegaly or mass.  EXTREMITIES: No pedal edema, cyanosis, or clubbing.  PSYCHIATRIC: The patient is alert and oriented x 3.  SKIN: No obvious rash, lesion, or ulcer.   DATA REVIEW:   CBC Recent Labs  Lab 07/05/17 0317  WBC 13.4*  HGB 13.4  HCT 40.5  PLT 177    Chemistries  Recent Labs  Lab 07/05/17 0317  NA 140  K 3.9  CL 108  CO2 24  GLUCOSE 116*  BUN 13  CREATININE 0.98  CALCIUM 8.6*  AST 61*  ALT 90*  ALKPHOS 137*  BILITOT 2.9*    Cardiac Enzymes No results for input(s): TROPONINI in the last 168 hours.  Microbiology Results  @MICRORSLT48 @  RADIOLOGY:  Dg Cholangiogram Operative  Result Date: 07/05/2017 CLINICAL DATA:  Intraoperative cholangiogram during laparoscopic cholecystectomy. EXAM: INTRAOPERATIVE CHOLANGIOGRAM FLUOROSCOPY TIME:  29 seconds COMPARISON:  Nuclear medicine HIDA scan - 07/04/2017; right upper quadrant abdominal ultrasound - 07/03/2017 FINDINGS: Intraoperative  cholangiographic images of the right upper abdominal quadrant during laparoscopic cholecystectomy are provided for review. Contrast injection demonstrates selective cannulation of the central aspect of the cystic duct. There is passage of contrast through the central aspect of the cystic duct with filling of a moderately dilated common bile duct. There is eventual passage of contrast through the distal aspect of the CBD with opacification of the duodenum. The distal aspect of the CBD appears somewhat tapered without discrete intraluminal filling defect. There is reflux of contrast into the gallbladder fossa with several ill-defined filling defects favored to represent a combination of cholelithiasis and biliary sludge. There are no discrete filling defects within the opacified portions of the biliary system to suggest the presence of choledocholithiasis. IMPRESSION: 1. Cholelithiasis without evidence of choledocholithiasis. 2. Tapered narrowing of the distal aspect of the CBD without discrete intraluminal filling defect to suggest choledocholithiasis. Findings are nonspecific though could be seen in the setting an ampullary stenosis. Clinical correlation is advised. Further evaluation with ERCP or MRCP could be performed as indicated. Electronically Signed   By: Sandi Mariscal M.D.   On: 07/05/2017 13:42   Nm Hepato  W/eject Fract  Result Date: 07/04/2017 CLINICAL DATA:  Right upper quadrant pain EXAM: NUCLEAR MEDICINE HEPATOBILIARY IMAGING VIEWS: Anterior right upper quadrant obtained serially over 4 hours. RADIOPHARMACEUTICALS:  5.4 mCi Tc-71m  Choletec IV COMPARISON:  Ultrasound right upper quadrant July 03, 2017 FINDINGS: Liver uptake is normal. There is prompt visualization of small bowel, indicating patency of the common bile duct. Images obtained over a 4 hour time span show nonvisualization of the gallbladder. IMPRESSION: Nonvisualization of gallbladder over 4 hours. This finding is felt to be indicative  of cystic duct obstruction and acute cholecystitis. Small bowel visualizes promptly indicating patency of the common bile duct. Electronically Signed   By: Lowella Grip III M.D.   On: 07/04/2017 17:01      Allergies as of 07/06/2017      Reactions   Morphine And Related Other (See Comments)   Chest pain   Tramadol Other (See Comments)   Tape Rash   Other reaction(s): UNKNOWN      Medication List    TAKE these medications   amLODipine 5 MG tablet Commonly known as:  NORVASC Take 1 tablet (5 mg total) by mouth daily. MUST KEEP APPT IN January FOR FURTHER REFILLS   b complex vitamins tablet Take 1 tablet by mouth daily.   Cholecalciferol 1000 units tablet Take 1,000 Units by mouth 2 (two) times daily.   citalopram 20 MG tablet Commonly known as:  CELEXA TAKE ONE TABLET EVERY DAY   HYDROcodone-acetaminophen 5-325 MG tablet Commonly known as:  NORCO/VICODIN Take 1-2 tablets by mouth every 4 (four) hours as needed for moderate pain.   Melatonin 3 MG Caps Take 3 mg by mouth at bedtime.   metoprolol succinate 50 MG 24 hr tablet Commonly known as:  TOPROL-XL TAKE ONE TABLET BY MOUTH EVERY DAY   mirabegron ER 25 MG Tb24 tablet Commonly known as:  MYRBETRIQ Take 1 tablet (25 mg total) by mouth daily.   omeprazole 40 MG capsule Commonly known as:  PRILOSEC TAKE 1 CAPSULE TWICE DAILY NEEDS APPT FOR MORE REFILLS What changed:  See the new instructions.   rosuvastatin 10 MG tablet Commonly known as:  CRESTOR TAKE ONE TABLET EVERY DAY   vitamin E 400 UNIT capsule Take 400 Units by mouth daily.         Management plans discussed with the patient and she is in agreement. Stable for discharge home  Patient should follow up with dr byrnett  CODE STATUS:     Code Status Orders  (From admission, onward)        Start     Ordered   07/03/17 2030  Full code  Continuous     07/03/17 2029    Code Status History    Date Active Date Inactive Code Status Order ID  Comments User Context   This patient has a current code status but no historical code status.    Advance Directive Documentation     Most Recent Value  Type of Advance Directive  Healthcare Power of Attorney, Living will  Pre-existing out of facility DNR order (yellow form or pink MOST form)  No data  "MOST" Form in Place?  No data      TOTAL TIME TAKING CARE OF THIS PATIENT: 38 minutes.   Plan of care discussed with Dr. Tollie Pizza. Note: This dictation was prepared with Dragon dictation along with smaller phrase technology. Any transcriptional errors that result from this process are unintentional.  Kaedynce Tapp M.D on 07/06/2017 at 8:58  AM  Between 7am to 6pm - Pager - [phone] After 6pm go to [email or web address] - password [email or web address] [facility]  Office  [phone]  CC: Primary care physician; [name], MD

## 2017-07-06 NOTE — Care Management Obs Status (Signed)
[facility] NOTIFICATION   Patient Details  Name: Kathy Long MRN: [id number] Date of Birth: Nov 15, 1950   Medicare Observation Status Notification Given:  Yes    Beverly Sessions, RN 07/06/2017, 9:59 AM

## 2017-07-06 NOTE — Plan of Care (Signed)
Patient ambulating in hallway independently.  Kathy Long

## 2017-07-06 NOTE — Progress Notes (Signed)
Discharge instructions reviewed with patient and husband, including new medications.  All questions were answered and understanding was verbalized.  Patient discharged home via wheelchair in stable condition escorted by volunteer staff.

## 2017-07-06 NOTE — Anesthesia Postprocedure Evaluation (Signed)
Anesthesia Post Note  Patient: Lillee Mooneyhan Naas  Procedure(s) Performed: LAPAROSCOPIC CHOLECYSTECTOMY WITH INTRAOPERATIVE CHOLANGIOGRAM (N/A )  Patient location during evaluation: PACU Anesthesia Type: General Level of consciousness: awake and alert and oriented Pain management: pain level controlled Vital Signs Assessment: post-procedure vital signs reviewed and stable Respiratory status: spontaneous breathing, nonlabored ventilation and respiratory function stable Cardiovascular status: blood pressure returned to baseline and stable Postop Assessment: no signs of nausea or vomiting Anesthetic complications: no     Last Vitals:  Vitals:   07/06/17 0425 07/06/17 0739  BP: (!) 179/80 (!) 169/82  Pulse: 67 69  Resp: 16 14  Temp: 36.7 C 36.5 C  SpO2: 93% 91%    Last Pain:  Vitals:   07/06/17 0808  TempSrc:   PainSc: Asleep                 Keshaun Dubey

## 2017-07-07 ENCOUNTER — Encounter: Payer: Self-pay | Admitting: Vascular Surgery

## 2017-07-07 ENCOUNTER — Telehealth: Payer: Self-pay | Admitting: *Deleted

## 2017-07-07 LAB — SURGICAL PATHOLOGY

## 2017-07-07 MED ORDER — PROMETHAZINE HCL 25 MG PO TABS: 25.0000 mg | ORAL_TABLET | ORAL | 0 refills | Status: DC | PRN

## 2017-07-07 NOTE — Telephone Encounter (Signed)
Calling to check on her since her discharge home

## 2017-07-07 NOTE — Telephone Encounter (Signed)
She states she is doing well. Denies nausea or vomiting. She states she is sleeping good and passing flatus. She states "I'm making progress". Aware of f/u appointment or to call if needed sooner.

## 2017-07-07 NOTE — Telephone Encounter (Signed)
Advised to stop hydrocodone for now, phenergan RX sent to pharmacy per Dr Bary Castilla. Recommend heating pad for comfort. She is aware to call in the morning with a status update.

## 2017-07-07 NOTE — Telephone Encounter (Signed)
She states that after I talked with her this morning she started feeling bad. She states she has been taking tylenol alternating with the hydrocodone on empty stomach. She has been able to tolerate some jello and a popsicle. She states she is drinking plenty of water and her urine is yellow. Passing flatus but no BM. Instructed that she is taking too much tylenol and that I would talk with Dr Bary Castilla about the nausea.

## 2017-07-07 NOTE — Telephone Encounter (Signed)
Pt called answering service

## 2017-07-08 ENCOUNTER — Other Ambulatory Visit: Payer: Self-pay

## 2017-07-08 ENCOUNTER — Inpatient Hospital Stay
Admission: AD | Admit: 2017-07-08 | Discharge: 2017-07-13 | DRG: 395 | Disposition: A | Discharge status: Home / Self Care | Payer: Medicare Other | Source: Ambulatory Visit | Attending: General Surgery | Admitting: General Surgery

## 2017-07-08 ENCOUNTER — Encounter: Payer: Self-pay | Admitting: General Surgery

## 2017-07-08 ENCOUNTER — Telehealth: Payer: Self-pay | Admitting: *Deleted

## 2017-07-08 ENCOUNTER — Other Ambulatory Visit
Admission: RE | Admit: 2017-07-08 | Discharge: 2017-07-08 | Disposition: A | Discharge status: Home / Self Care | Payer: Medicare Other | Source: Ambulatory Visit | Attending: General Surgery | Admitting: General Surgery

## 2017-07-08 ENCOUNTER — Ambulatory Visit
Admission: RE | Admit: 2017-07-08 | Discharge: 2017-07-08 | Disposition: A | Discharge status: Home / Self Care | Payer: Medicare Other | Source: Ambulatory Visit | Attending: General Surgery | Admitting: General Surgery

## 2017-07-08 ENCOUNTER — Ambulatory Visit (INDEPENDENT_AMBULATORY_CARE_PROVIDER_SITE_OTHER): Payer: Medicare Other | Admitting: General Surgery

## 2017-07-08 ENCOUNTER — Telehealth: Payer: Self-pay | Admitting: Internal Medicine

## 2017-07-08 VITALS — BP 100/60 | HR 79 | Temp 97.0°F | Resp 12 | Ht 64.0 in | Wt 167.0 lb

## 2017-07-08 DIAGNOSIS — R9389 Abnormal findings on diagnostic imaging of other specified body structures: Secondary | ICD-10-CM | POA: Insufficient documentation

## 2017-07-08 DIAGNOSIS — I10 Essential (primary) hypertension: Secondary | ICD-10-CM | POA: Diagnosis present

## 2017-07-08 DIAGNOSIS — K9189 Other postprocedural complications and disorders of digestive system: Secondary | ICD-10-CM

## 2017-07-08 DIAGNOSIS — R1013 Epigastric pain: Secondary | ICD-10-CM

## 2017-07-08 DIAGNOSIS — Z9049 Acquired absence of other specified parts of digestive tract: Secondary | ICD-10-CM | POA: Diagnosis not present

## 2017-07-08 DIAGNOSIS — K839 Disease of biliary tract, unspecified: Secondary | ICD-10-CM | POA: Diagnosis not present

## 2017-07-08 DIAGNOSIS — Y838 Other surgical procedures as the cause of abnormal reaction of the patient, or of later complication, without mention of misadventure at the time of the procedure: Secondary | ICD-10-CM | POA: Diagnosis present

## 2017-07-08 DIAGNOSIS — J90 Pleural effusion, not elsewhere classified: Secondary | ICD-10-CM | POA: Insufficient documentation

## 2017-07-08 DIAGNOSIS — J9811 Atelectasis: Secondary | ICD-10-CM

## 2017-07-08 DIAGNOSIS — K838 Other specified diseases of biliary tract: Secondary | ICD-10-CM | POA: Diagnosis not present

## 2017-07-08 DIAGNOSIS — R197 Diarrhea, unspecified: Secondary | ICD-10-CM | POA: Diagnosis not present

## 2017-07-08 DIAGNOSIS — Z9851 Tubal ligation status: Secondary | ICD-10-CM | POA: Diagnosis not present

## 2017-07-08 DIAGNOSIS — E860 Dehydration: Secondary | ICD-10-CM | POA: Diagnosis present

## 2017-07-08 DIAGNOSIS — K810 Acute cholecystitis: Secondary | ICD-10-CM | POA: Insufficient documentation

## 2017-07-08 DIAGNOSIS — Z8041 Family history of malignant neoplasm of ovary: Secondary | ICD-10-CM | POA: Diagnosis not present

## 2017-07-08 DIAGNOSIS — Z85820 Personal history of malignant melanoma of skin: Secondary | ICD-10-CM | POA: Diagnosis not present

## 2017-07-08 DIAGNOSIS — Z79899 Other long term (current) drug therapy: Secondary | ICD-10-CM | POA: Diagnosis not present

## 2017-07-08 DIAGNOSIS — E785 Hyperlipidemia, unspecified: Secondary | ICD-10-CM | POA: Diagnosis present

## 2017-07-08 DIAGNOSIS — Z91048 Other nonmedicinal substance allergy status: Secondary | ICD-10-CM | POA: Diagnosis not present

## 2017-07-08 DIAGNOSIS — Z885 Allergy status to narcotic agent status: Secondary | ICD-10-CM | POA: Diagnosis not present

## 2017-07-08 LAB — CBC WITH DIFFERENTIAL/PLATELET
Basophils Absolute: 0.1 10*3/uL (ref 0–0.1)
Basophils Relative: 0 %
Eosinophils Absolute: 1 10*3/uL — ABNORMAL HIGH (ref 0–0.7)
Eosinophils Relative: 7 %
HCT: 44 % (ref 35.0–47.0)
Hemoglobin: 14.4 g/dL (ref 12.0–16.0)
Lymphocytes Relative: 7 %
Lymphs Abs: 1 10*3/uL (ref 1.0–3.6)
MCH: 30.5 pg (ref 26.0–34.0)
MCHC: 32.8 g/dL (ref 32.0–36.0)
MCV: 92.8 fL (ref 80.0–100.0)
Monocytes Absolute: 0.9 10*3/uL (ref 0.2–0.9)
Monocytes Relative: 6 %
Neutro Abs: 11.8 10*3/uL — ABNORMAL HIGH (ref 1.4–6.5)
Neutrophils Relative %: 80 %
Platelets: 322 10*3/uL (ref 150–440)
RBC: 4.73 MIL/uL (ref 3.80–5.20)
RDW: 13.7 % (ref 11.5–14.5)
WBC: 14.8 10*3/uL — ABNORMAL HIGH (ref 3.6–11.0)

## 2017-07-08 LAB — BASIC METABOLIC PANEL
Anion gap: 14 (ref 5–15)
BUN: 26 mg/dL — ABNORMAL HIGH (ref 6–20)
CO2: 25 mmol/L (ref 22–32)
Calcium: 9.6 mg/dL (ref 8.9–10.3)
Chloride: 102 mmol/L (ref 101–111)
Creatinine, Ser: 1.5 mg/dL — ABNORMAL HIGH (ref 0.44–1.00)
GFR calc Af Amer: 40 mL/min — ABNORMAL LOW (ref >60)
GFR calc non Af Amer: 34 mL/min — ABNORMAL LOW (ref >60)
Glucose, Bld: 133 mg/dL — ABNORMAL HIGH (ref 65–99)
Potassium: 4.4 mmol/L (ref 3.5–5.1)
Sodium: 141 mmol/L (ref 135–145)

## 2017-07-08 LAB — HEPATIC FUNCTION PANEL
ALT: 92 U/L — ABNORMAL HIGH (ref 14–54)
AST: 61 U/L — ABNORMAL HIGH (ref 15–41)
Albumin: 3.7 g/dL (ref 3.5–5.0)
Alkaline Phosphatase: 209 U/L — ABNORMAL HIGH (ref 38–126)
Bilirubin, Direct: 1.3 mg/dL — ABNORMAL HIGH (ref 0.1–0.5)
Indirect Bilirubin: 1.8 mg/dL — ABNORMAL HIGH (ref 0.3–0.9)
Total Bilirubin: 3.1 mg/dL — ABNORMAL HIGH (ref 0.3–1.2)
Total Protein: 7.3 g/dL (ref 6.5–8.1)

## 2017-07-08 MED ORDER — LACTATED RINGERS IV SOLN
INTRAVENOUS | Status: DC
  Administered 2017-07-08 – 2017-07-13 (×9): via INTRAVENOUS

## 2017-07-08 MED ORDER — ACETAMINOPHEN 10 MG/ML IV SOLN
1000.0000 mg | Freq: Four times a day (QID) | INTRAVENOUS | Status: DC
  Administered 2017-07-08 – 2017-07-09 (×2): 1000 mg via INTRAVENOUS
  Filled 2017-07-08 (×4): qty 100

## 2017-07-08 MED ORDER — CITALOPRAM HYDROBROMIDE 20 MG PO TABS
20.0000 mg | ORAL_TABLET | Freq: Every day | ORAL | Status: DC
  Administered 2017-07-09 – 2017-07-12 (×4): 20 mg via ORAL
  Filled 2017-07-08 (×5): qty 1

## 2017-07-08 MED ORDER — DEXTROSE 5 % IV SOLN
1.0000 g | Freq: Two times a day (BID) | INTRAVENOUS | Status: DC
  Administered 2017-07-08: 1 g via INTRAVENOUS
  Filled 2017-07-08 (×3): qty 1

## 2017-07-08 MED ORDER — METOPROLOL SUCCINATE ER 50 MG PO TB24
50.0000 mg | ORAL_TABLET | Freq: Every day | ORAL | Status: DC
  Administered 2017-07-09 – 2017-07-12 (×4): 50 mg via ORAL
  Filled 2017-07-08 (×4): qty 1

## 2017-07-08 MED ORDER — MEPERIDINE HCL 25 MG/ML IJ SOLN
12.5000 mg | INTRAMUSCULAR | Status: DC | PRN
  Administered 2017-07-09: 12.5 mg via INTRAVENOUS
  Filled 2017-07-08: qty 1

## 2017-07-08 MED ORDER — IOPAMIDOL (ISOVUE-300) INJECTION 61%
75.0000 mL | Freq: Once | INTRAVENOUS | Status: AC | PRN
  Administered 2017-07-08: 75 mL via INTRAVENOUS

## 2017-07-08 NOTE — Patient Instructions (Signed)
Patient has been scheduled for urgent CT at [facility] for 12:30 pm on 07/08/17.  Patient informed.  Patient also to have CBC, Met Panel and Liver panel done at same time.  Call report to Dr. Bary Castilla.  Patient is to wait at hospital until Dr. Bary Castilla sees reports.

## 2017-07-08 NOTE — Telephone Encounter (Signed)
Transition Care Management Follow-up Telephone Call  How have you been since you were released from the hospital? Patient feeling better.   Do you understand why you were in the hospital? yes   Do you understand the discharge instrcutions? yes  Items Reviewed:  Medications reviewed: yes  Allergies reviewed: yes  Dietary changes reviewed: yes  Referrals reviewed: yes   Functional Questionnaire:   Activities of Daily Living (ADLs):   She states they are independent in the following: ambulation, bathing and hygiene, feeding, continence, grooming, toileting and dressing States they require assistance with the following: No assistance needed   Any transportation issues/concerns?: no   Any patient concerns? no   Confirmed importance and date/time of follow-up visits scheduled: yes   Confirmed with patient if condition begins to worsen call PCP or go to the ER.  Patient was given the Call-a-Nurse line [phone]: yes

## 2017-07-08 NOTE — Telephone Encounter (Signed)
Patient states she is still having lots of pain under her right breast, the pain radiations to her back.  The gas pressure  is much better. She states the Phenergan only last about 1 1/5. Husband got on the phone and states she is seeing bugs and people that are not there. She is talking out of her head and having hallucinates. Advised to stop taking the Phenergan. He is giving her Aleve now. She is eating. The patient is aware to use a heating pad .

## 2017-07-08 NOTE — H&P (Signed)
Kathy Long is an 67 y.o. female.   Chief Complaint: Abdominal pain, nausea  HPI: See office note from this morning.  3 days s/p lap cholecystectomy for hemorrhagic cholecystitis.   Past Medical History:  Diagnosis Date  . Acoustic neuroma (HCC)    left ear  . Cancer (HCC)    melanoma, shoulder  . Depression   . Hyperlipidemia   . Hypertension     Past Surgical History:  Procedure Laterality Date  . APPENDECTOMY  2010  . BACK SURGERY     disectomy lumbar, scar tissue  . CHOLECYSTECTOMY N/A 07/05/2017   Procedure: LAPAROSCOPIC CHOLECYSTECTOMY WITH INTRAOPERATIVE CHOLANGIOGRAM;  Surgeon: Robert Bellow, MD;  Location: [facility] ORS;  Service: General;  Laterality: N/A;  . COLONOSCOPY     2009 and color gaurd in 2018  . FOOT SURGERY     x 2  . MELANOMA EXCISION Left 2000  . PALATOPLASTY N/A 04/08/2015   Procedure: PALATOPLASTY;  Surgeon: Beverly Gust, MD;  Location: [facility] OR[facility];  Service: ENT;  Laterality: N/A;  . TONSILLECTOMY N/A 04/08/2015   Procedure: TONSILLECTOMY;  Surgeon: Beverly Gust, MD;  Location: [facility] OR[facility];  Service: ENT;  Laterality: N/A;  . TUBAL LIGATION      Family History  Problem Relation Age of Onset  . Cancer Sister 14       ovarian ca   Social History:  reports that  has never smoked. she has never used smokeless tobacco. She reports that she drinks alcohol. She reports that she does not use drugs.  Allergies:  Allergies  Allergen Reactions  . Morphine And Related Other (See Comments)    Chest pain  . Tramadol Other (See Comments)  . Tape Rash    Other reaction(s): UNKNOWN    Medications Prior to Admission  Medication Sig Dispense Refill  . amLODipine (NORVASC) 5 MG tablet Take 1 tablet (5 mg total) by mouth daily. MUST KEEP APPT IN January FOR FURTHER REFILLS 30 tablet 0  . b complex vitamins tablet Take 1 tablet by mouth daily.    . Cholecalciferol 1000 UNITS tablet Take 1,000 Units by mouth 2 (two) times daily.    . citalopram (CELEXA) 20  MG tablet TAKE ONE TABLET EVERY DAY 90 tablet 1  . HYDROcodone-acetaminophen (NORCO/VICODIN) 5-325 MG tablet Take 1-2 tablets by mouth every 4 (four) hours as needed for moderate pain. (Patient not taking: Reported on 07/08/2017) 30 tablet 0  . Melatonin 3 MG CAPS Take 3 mg by mouth at bedtime.    . metoprolol succinate (TOPROL-XL) 50 MG 24 hr tablet TAKE ONE TABLET BY MOUTH EVERY DAY 90 tablet 1  . mirabegron ER (MYRBETRIQ) 25 MG TB24 tablet Take 1 tablet (25 mg total) by mouth daily. 30 tablet 5  . omeprazole (PRILOSEC) 40 MG capsule TAKE 1 CAPSULE TWICE DAILY NEEDS APPT FOR MORE REFILLS (Patient taking differently: TAKE 1 CAPSULE BY MOUTH EVERY MORNING) 180 capsule 1  . promethazine (PHENERGAN) 25 MG tablet Take 1 tablet (25 mg total) by mouth every 4 (four) hours as needed for nausea or vomiting. 10 tablet 0  . rosuvastatin (CRESTOR) 10 MG tablet TAKE ONE TABLET EVERY DAY 90 tablet 1  . vitamin E 400 UNIT capsule Take 400 Units by mouth daily.      Results for orders placed or performed in visit on 07/08/17 (from the past 48 hour(s))  CBC w/Diff/Platelet     Status: Abnormal   Collection Time: 07/08/17 12:29 PM  Result Value Ref Range  WBC 14.8 (H) 3.6 - 11.0 K/uL   RBC 4.73 3.80 - 5.20 MIL/uL   Hemoglobin 14.4 12.0 - 16.0 g/dL   HCT 44.0 35.0 - 47.0 %   MCV 92.8 80.0 - 100.0 fL   MCH 30.5 26.0 - 34.0 pg   MCHC 32.8 32.0 - 36.0 g/dL   RDW 13.7 11.5 - 14.5 %   Platelets 322 150 - 440 K/uL   Neutrophils Relative % 80 %   Neutro Abs 11.8 (H) 1.4 - 6.5 K/uL   Lymphocytes Relative 7 %   Lymphs Abs 1.0 1.0 - 3.6 K/uL   Monocytes Relative 6 %   Monocytes Absolute 0.9 0.2 - 0.9 K/uL   Eosinophils Relative 7 %   Eosinophils Absolute 1.0 (H) 0 - 0.7 K/uL   Basophils Relative 0 %   Basophils Absolute 0.1 0 - 0.1 K/uL    Comment: Performed at [facility], [address] [place], [place] [address]  Basic metabolic panel     Status: Abnormal   Collection Time: 07/08/17 12:29 PM   Result Value Ref Range   Sodium 141 135 - 145 mmol/L   Potassium 4.4 3.5 - 5.1 mmol/L   Chloride 102 101 - 111 mmol/L   CO2 25 22 - 32 mmol/L   Glucose, Bld 133 (H) 65 - 99 mg/dL   BUN 26 (H) 6 - 20 mg/dL   Creatinine, Ser 1.50 (H) 0.44 - 1.00 mg/dL   Calcium 9.6 8.9 - 10.3 mg/dL   GFR calc non Af Amer 34 (L) >60 mL/min   GFR calc Af Amer 40 (L) >60 mL/min    Comment: (NOTE) The eGFR has been calculated using the CKD EPI equation. This calculation has not been validated in all clinical situations. eGFR's persistently <60 mL/min signify possible Chronic Kidney Disease.    Anion gap 14 5 - 15    Comment: Performed at [facility], [address] [place], [place] [address]  Hepatic function panel     Status: Abnormal   Collection Time: 07/08/17 12:29 PM  Result Value Ref Range   Total Protein 7.3 6.5 - 8.1 g/dL   Albumin 3.7 3.5 - 5.0 g/dL   AST 61 (H) 15 - 41 U/L   ALT 92 (H) 14 - 54 U/L   Alkaline Phosphatase 209 (H) 38 - 126 U/L   Total Bilirubin 3.1 (H) 0.3 - 1.2 mg/dL   Bilirubin, Direct 1.3 (H) 0.1 - 0.5 mg/dL   Indirect Bilirubin 1.8 (H) 0.3 - 0.9 mg/dL    Comment: Performed at [facility], [address] [place], [place] [address]   Ct Abdomen Pelvis W Contrast  Result Date: 07/08/2017 CLINICAL DATA:  RIGHT upper quadrant pain cholecystectomy 1 week prior. Nausea. EXAM: CT ABDOMEN AND PELVIS WITH CONTRAST TECHNIQUE: Multidetector CT imaging of the abdomen and pelvis was performed using the standard protocol following bolus administration of intravenous contrast. CONTRAST:  43m ISOVUE-300 IOPAMIDOL (ISOVUE-300) INJECTION 61% COMPARISON:  Intraoperative cholangiogram. Ultrasound 07/03/2017, CT 10/02/2014 FINDINGS: Lower chest: Mild bibasilar atelectasis. Small low-density RIGHT effusion Hepatobiliary: Postcholecystectomy. Small amount of unorganized fluid in the gallbladder fossa. There is a collection of fluid along the medial margin of the RIGHT hepatic  lobe measuring 6.0 by 4.1 cm (image 29, series 2). This is low-density. Additionally there is fluid of beneath the hemidiaphragm over the barespace of the liver measuring 12 mm in depth (image 12, series 2). Small amount of fluid collects within the pelvis which is also  low-attenuation. Postcholecystectomy. Cholecystectomy clips in the gallbladder fossa. There is no intrahepatic duct dilatation. Pancreas: The common bile duct is normal caliber. The pancreas is normal. Spleen: Normal spleen Adrenals/urinary tract: Adrenal glands and kidneys are normal. The ureters and bladder normal. Stomach/Bowel: Stomach, small-bowel cecum normal. Post appendectomy. Small amount inflammation along the hepatic flexure related to recent surgery. No acute bowel inflammation. 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 Rectum normal. Vascular/Lymphatic: Abdominal aorta is normal caliber. There is no retroperitoneal or periportal lymphadenopathy. No pelvic lymphadenopathy. Reproductive: Uterus and ovaries normal Other: No intraperitoneal free air. Intraperitoneal free fluid as described hepatic section Musculoskeletal: No aggressive osseous lesion IMPRESSION: 1. Fluid within Morrison's pouch and over the bare space of the liver is concerning for bile leak. Overall fluid volume is mild-to-moderate. Consider nuclear medicine HIDA scan if clinically equivocal for bile leak. 2. No biliary obstruction. 3. No evidence pancreatitis. 4. Small amount free fluid the pelvis related to the presumed bile leak described above. 5. Small RIGHT effusion and basilar atelectasis. Electronically Signed   By: Suzy Bouchard M.D.   On: 07/08/2017 15:23   Bilirubin 2.0 preop, 3.1 today. Increase in direct bilirubin.from 0.4 to 1.3. Marked decrease in eGFR, rise in creatinine to 1.5.  CT results reviewed by phone with radiologist.  ROS  Blood pressure 138/79, pulse 67, temperature 97.6 F (36.4 C), temperature source Oral, resp.  rate 16, height 5' 4"  (1.626 m), weight 167 lb 0 oz (75.8 kg), SpO2 96 %. Physical Exam  Modest RUQ tenderness.    Assessment/Plan Bile leak vs post cholecystectomy bleeding. HGB stability speaks to the former. HIDA scan ordered for AM. Hydration tonight. Fortaz at lower dose based on renal function. Hold pharmacologic DVT prevention in the event intervention ERCP/ CT drainage required.  Results reviewed with patient and husband.  Dr. Tawnya Crook covering tonight aware of patient and plans.   Robert Bellow, MD 07/08/2017, 5:51 PM

## 2017-07-08 NOTE — Progress Notes (Signed)
Patient ID: Kathy Long, female   DOB: 23-Sep-1950, 67 y.o.   MRN: [id number]  Chief Complaint  Patient presents with  . Routine Post Op  . Medication Refill    HPI Kathy Long is a 67 y.o. female here today for her post op gallbladder removal done on 07/05/2017. Patient states she is having lot of pain under her right breast and radiations to her back. Red- orange urine since hospital stay.    The patient had been well until Saturday, February 2 when she developed acute abdominal pain after a meal in the evening.  She was subsequently admitted to the hospital on February 3 when ultrasound showed evidence of a dilated gallbladder and mild prominence of the common bile duct.  HIDA scan on February 4 showed nonvisualization of the gallbladder.  She underwent a laparoscopic cholecystectomy with cholangiograms on February 5.  At that time acute hemorrhagic cholecystitis was noted.  No gallstones.  Cholangiograms were limited but they did show the cystic duct, junction with the common bile duct/common hepatic duct and flow into the duodenum.  The patient was discharged home the day after surgery.  She had been tolerating liquids well, but has had difficulty with nausea with solid foods.  She reports no vomiting.  She has been passing gas.  Urine is reported as dark.  She was contacted yesterday and she was making some improvement but developed nausea in the afternoon for which Phenergan was prescribed.  She took a dose of both hydrocodone and Phenergan late last night and this morning was apparently convinced that her mother-in-law was in the house although she was not.  The patient comes in today for assessment.  Patient reports she has pain in the right lateral chest wall with deep inspiration.  The patient denies fever or chills.  Husband, AP present at visit.  HPI  Past Medical History:  Diagnosis Date  . Acoustic neuroma (HCC)    left ear  . Cancer (HCC)    melanoma, shoulder  .  Depression   . Hyperlipidemia   . Hypertension     Past Surgical History:  Procedure Laterality Date  . APPENDECTOMY  2010  . BACK SURGERY     disectomy lumbar, scar tissue  . CHOLECYSTECTOMY N/A 07/05/2017   Procedure: LAPAROSCOPIC CHOLECYSTECTOMY WITH INTRAOPERATIVE CHOLANGIOGRAM;  Surgeon: Robert Bellow, MD;  Location: [facility] ORS;  Service: General;  Laterality: N/A;  . COLONOSCOPY     2009 and color gaurd in 2018  . FOOT SURGERY     x 2  . MELANOMA EXCISION Left 2000  . PALATOPLASTY N/A 04/08/2015   Procedure: PALATOPLASTY;  Surgeon: Beverly Gust, MD;  Location: [facility] OR[facility];  Service: ENT;  Laterality: N/A;  . TONSILLECTOMY N/A 04/08/2015   Procedure: TONSILLECTOMY;  Surgeon: Beverly Gust, MD;  Location: [facility] OR[facility];  Service: ENT;  Laterality: N/A;  . TUBAL LIGATION      Family History  Problem Relation Age of Onset  . Cancer Sister 62       ovarian ca    Social History Social History   Tobacco Use  . Smoking status: Never Smoker  . Smokeless tobacco: Never Used  Substance Use Topics  . Alcohol use: Yes    Alcohol/week: 0.0 oz    Comment: occ  . Drug use: No    Allergies  Allergen Reactions  . Morphine And Related Other (See Comments)    Chest pain  . Tramadol Other (See Comments)  . Tape Rash  Other reaction(s): UNKNOWN    Current Outpatient Medications  Medication Sig Dispense Refill  . amLODipine (NORVASC) 5 MG tablet Take 1 tablet (5 mg total) by mouth daily. MUST KEEP APPT IN January FOR FURTHER REFILLS 30 tablet 0  . b complex vitamins tablet Take 1 tablet by mouth daily.    . Cholecalciferol 1000 UNITS tablet Take 1,000 Units by mouth 2 (two) times daily.    . citalopram (CELEXA) 20 MG tablet TAKE ONE TABLET EVERY DAY 90 tablet 1  . Melatonin 3 MG CAPS Take 3 mg by mouth at bedtime.    . metoprolol succinate (TOPROL-XL) 50 MG 24 hr tablet TAKE ONE TABLET BY MOUTH EVERY DAY 90 tablet 1  . mirabegron ER (MYRBETRIQ) 25 MG TB24 tablet Take 1 tablet  (25 mg total) by mouth daily. 30 tablet 5  . omeprazole (PRILOSEC) 40 MG capsule TAKE 1 CAPSULE TWICE DAILY NEEDS APPT FOR MORE REFILLS (Patient taking differently: TAKE 1 CAPSULE BY MOUTH EVERY MORNING) 180 capsule 1  . promethazine (PHENERGAN) 25 MG tablet Take 1 tablet (25 mg total) by mouth every 4 (four) hours as needed for nausea or vomiting. 10 tablet 0  . rosuvastatin (CRESTOR) 10 MG tablet TAKE ONE TABLET EVERY DAY 90 tablet 1  . vitamin E 400 UNIT capsule Take 400 Units by mouth daily.    Marland Kitchen HYDROcodone-acetaminophen (NORCO/VICODIN) 5-325 MG tablet Take 1-2 tablets by mouth every 4 (four) hours as needed for moderate pain. (Patient not taking: Reported on 07/08/2017) 30 tablet 0   No current facility-administered medications for this visit.     Review of Systems Review of Systems  Constitutional: Negative.   Respiratory: Negative.   Cardiovascular: Negative.     Blood pressure 100/60, pulse 79, temperature (!) 97 F (36.1 C), temperature source Oral, resp. rate 12, height 5\' 4"  (1.626 m), weight 167 lb (75.8 kg), SpO2 95 %.  Physical Exam Physical Exam  Constitutional: She is oriented to person, place, and time. She appears well-developed and well-nourished.  Eyes: No scleral icterus.  Cardiovascular: Normal rate, regular rhythm and normal heart sounds.  Pulmonary/Chest: Effort normal and breath sounds normal.  Abdominal: Bowel sounds are normal.    Neurological: She is alert and oriented to person, place, and time.  Skin: Skin is warm and dry.    Data Reviewed Preoperative liver function studies (July 05, 2017) showed a direct bilirubin of 0.4 with a total bilirubin of 2.9, Serum transaminases peaked the day prior to surgery in the mid 150-175 range, and were trending downward the day of surgery.  Bilirubin was 1.51-day prior to surgery and two-point 9 in the morning of surgery.  Alkaline phosphatase peaked the day prior to surgery at 188, down to 137 the day of  surgery.  CBC the morning of surgery was 13,400, on admission 10,300.  Hemoglobin 13.4 morning of surgery, 15.9 on admission.    Assessment    Postoperative pain out of proportion to anticipated based on the patient being day 3 post cholecystectomy.    Plan  The patient showed marked inflammation around the gallbladder, and it was necessary to dissect the duodenum carefully from the neck of the gallbladder.  The cystic duct was visualized, and the dissection was maintained on the gallbladder wall making an occult common hepatic duct or bile duct injury unlikely.  With her pain with respiration, this is likely blood or fluid around the liver, but a CT scan with contrast will be requested.  We will recheck  her laboratory studies including liver function studies.  Dark urine may be related to dehydration or enterohepatic recirculation of bile if the leak is present.  Further treatment plans will be determined after the above-mentioned laboratory and radiologic studies are complete.  Patient to have a ct scan done and order blood work. The patient is aware to call back for any questions or concerns.   HPI, Physical Exam, Assessment and Plan have been scribed under the direction and in the presence of Hervey Ard, MD.  Gaspar Cola, CMA  I have completed the exam and reviewed the above documentation for accuracy and completeness.  I agree with the above.  Haematologist has been used and any errors in dictation or transcription are unintentional.  Hervey Ard, M.D., F.A.C.S.  Forest Gleason Alexsus Papadopoulos 07/08/2017, 12:36 PM

## 2017-07-09 ENCOUNTER — Inpatient Hospital Stay: Payer: Medicare Other

## 2017-07-09 LAB — CBC
HCT: 36.5 % (ref 35.0–47.0)
Hemoglobin: 12.1 g/dL (ref 12.0–16.0)
MCH: 30.9 pg (ref 26.0–34.0)
MCHC: 33.3 g/dL (ref 32.0–36.0)
MCV: 92.7 fL (ref 80.0–100.0)
Platelets: 253 10*3/uL (ref 150–440)
RBC: 3.94 MIL/uL (ref 3.80–5.20)
RDW: 13.8 % (ref 11.5–14.5)
WBC: 13.1 10*3/uL — ABNORMAL HIGH (ref 3.6–11.0)

## 2017-07-09 LAB — COMPREHENSIVE METABOLIC PANEL
ALT: 56 U/L — ABNORMAL HIGH (ref 14–54)
AST: 34 U/L (ref 15–41)
Albumin: 2.6 g/dL — ABNORMAL LOW (ref 3.5–5.0)
Alkaline Phosphatase: 204 U/L — ABNORMAL HIGH (ref 38–126)
Anion gap: 11 (ref 5–15)
BUN: 17 mg/dL (ref 6–20)
CO2: 25 mmol/L (ref 22–32)
Calcium: 8.4 mg/dL — ABNORMAL LOW (ref 8.9–10.3)
Chloride: 105 mmol/L (ref 101–111)
Creatinine, Ser: 0.99 mg/dL (ref 0.44–1.00)
GFR calc Af Amer: >60 mL/min (ref >60)
GFR calc non Af Amer: 57 mL/min — ABNORMAL LOW (ref >60)
Glucose, Bld: 98 mg/dL (ref 65–99)
Potassium: 3.5 mmol/L (ref 3.5–5.1)
Sodium: 141 mmol/L (ref 135–145)
Total Bilirubin: 2.1 mg/dL — ABNORMAL HIGH (ref 0.3–1.2)
Total Protein: 5.9 g/dL — ABNORMAL LOW (ref 6.5–8.1)

## 2017-07-09 LAB — BILIRUBIN, DIRECT: Bilirubin, Direct: 0.8 mg/dL — ABNORMAL HIGH (ref 0.1–0.5)

## 2017-07-09 LAB — LIPASE, BLOOD: Lipase: 28 U/L (ref 11–51)

## 2017-07-09 MED ORDER — AMLODIPINE BESYLATE 5 MG PO TABS
5.0000 mg | ORAL_TABLET | Freq: Every day | ORAL | Status: DC
  Administered 2017-07-10 – 2017-07-12 (×3): 5 mg via ORAL
  Filled 2017-07-09 (×3): qty 1

## 2017-07-09 MED ORDER — ONDANSETRON HCL 4 MG PO TABS
4.0000 mg | ORAL_TABLET | ORAL | Status: DC | PRN
  Administered 2017-07-10 – 2017-07-12 (×7): 4 mg via ORAL
  Filled 2017-07-09 (×7): qty 1

## 2017-07-09 MED ORDER — TECHNETIUM TC 99M MEBROFENIN IV KIT
5.3750 | PACK | Freq: Once | INTRAVENOUS | Status: AC | PRN
  Administered 2017-07-09: 5.375 via INTRAVENOUS

## 2017-07-09 MED ORDER — DEXTROSE 5 % IV SOLN
1.0000 g | Freq: Three times a day (TID) | INTRAVENOUS | Status: DC
  Administered 2017-07-09 – 2017-07-11 (×6): 1 g via INTRAVENOUS
  Filled 2017-07-09 (×8): qty 1

## 2017-07-09 MED ORDER — ACETAMINOPHEN 325 MG PO TABS
650.0000 mg | ORAL_TABLET | ORAL | Status: DC | PRN
  Administered 2017-07-09 – 2017-07-11 (×7): 650 mg via ORAL
  Filled 2017-07-09 (×9): qty 2

## 2017-07-09 NOTE — Progress Notes (Signed)
Case reviewed with Dr. Allen Norris from GI. Unavailable for emergency procedure this weekend. (He is not on call). Patient is significantly better today, and transfer to another facility is not necessary at this time.   Leak noted on HIDA is late in appearance and modest in volume. Will plan to advance diet and continue observation. If her labs and symptoms continue to improve, the patient may be a candidate for d/c home with outpatient procedure on Tuesday. If labs do not improve, then a late Monday procedure in house is planned. Dr. Allen Norris will be available by phone tomorrow to help with plans [phone].  Plans for ERCP/ stent reviewed with the patient and family.

## 2017-07-09 NOTE — Plan of Care (Signed)
Patients pain has decreased. Advanced to soft diet. Plan for ercp possibly Monday

## 2017-07-09 NOTE — Progress Notes (Addendum)
Afebrile. BP up with fluids. Feeling better. Had an episode of confusion last night, but she recognized it at the time. Tolerating liquids. Nausea much improved. Lungs: Clear. Able to take a deep breath with less discomfort. ABD: Mild distension, good BS. Very soft. Non-tender. Labs: HGB down w/ hydration. WBC down slightly.  LFT's improved. TB down to 2.1, DB down to 0.8.  Alk phos with minimal change from yesterday, but transaminases almost normal. Renal function markedly improved w/ hydration, back to her baseline w/ eGFR of 57. Lipase: Normal. HIDA up to one hour: CBD intact, small pool at 1 hour adjacent to the midportion of the duct. No leak from gallbladder bed evident at this time.  IMP: Markedly improved.  Plan: Await final HIDA images before decision on ERCP.  Will continue hydration today, d/c phenergan.  Dr. Zachery Dauer covering in my absence.    Bile leak from area of hepatic duct/ common bile duct evident at 90 minutes with flow around liver.  Will review case w/ GI regarding timing of ERCP and stent.

## 2017-07-10 LAB — CBC WITH DIFFERENTIAL/PLATELET
Basophils Absolute: 0.1 10*3/uL (ref 0–0.1)
Basophils Relative: 0 %
Eosinophils Absolute: 0.4 10*3/uL (ref 0–0.7)
Eosinophils Relative: 3 %
HCT: 35.5 % (ref 35.0–47.0)
Hemoglobin: 11.7 g/dL — ABNORMAL LOW (ref 12.0–16.0)
Lymphocytes Relative: 5 %
Lymphs Abs: 0.8 10*3/uL — ABNORMAL LOW (ref 1.0–3.6)
MCH: 30.6 pg (ref 26.0–34.0)
MCHC: 33 g/dL (ref 32.0–36.0)
MCV: 92.5 fL (ref 80.0–100.0)
Monocytes Absolute: 1.4 10*3/uL — ABNORMAL HIGH (ref 0.2–0.9)
Monocytes Relative: 9 %
Neutro Abs: 12.7 10*3/uL — ABNORMAL HIGH (ref 1.4–6.5)
Neutrophils Relative %: 83 %
Platelets: 287 10*3/uL (ref 150–440)
RBC: 3.84 MIL/uL (ref 3.80–5.20)
RDW: 13.8 % (ref 11.5–14.5)
WBC: 15.4 10*3/uL — ABNORMAL HIGH (ref 3.6–11.0)

## 2017-07-10 LAB — COMPREHENSIVE METABOLIC PANEL
ALT: 52 U/L (ref 14–54)
AST: 40 U/L (ref 15–41)
Albumin: 2.5 g/dL — ABNORMAL LOW (ref 3.5–5.0)
Alkaline Phosphatase: 316 U/L — ABNORMAL HIGH (ref 38–126)
Anion gap: 11 (ref 5–15)
BUN: 11 mg/dL (ref 6–20)
CO2: 24 mmol/L (ref 22–32)
Calcium: 8 mg/dL — ABNORMAL LOW (ref 8.9–10.3)
Chloride: 107 mmol/L (ref 101–111)
Creatinine, Ser: 0.86 mg/dL (ref 0.44–1.00)
GFR calc Af Amer: >60 mL/min (ref >60)
GFR calc non Af Amer: >60 mL/min (ref >60)
Glucose, Bld: 107 mg/dL — ABNORMAL HIGH (ref 65–99)
Potassium: 2.9 mmol/L — ABNORMAL LOW (ref 3.5–5.1)
Sodium: 142 mmol/L (ref 135–145)
Total Bilirubin: 1.7 mg/dL — ABNORMAL HIGH (ref 0.3–1.2)
Total Protein: 5.9 g/dL — ABNORMAL LOW (ref 6.5–8.1)

## 2017-07-10 MED ORDER — MELATONIN 5 MG PO TABS
5.0000 mg | ORAL_TABLET | Freq: Every evening | ORAL | Status: DC | PRN
  Administered 2017-07-10 – 2017-07-12 (×3): 5 mg via ORAL
  Filled 2017-07-10 (×4): qty 1

## 2017-07-10 MED ORDER — POTASSIUM CHLORIDE CRYS ER 20 MEQ PO TBCR
20.0000 meq | EXTENDED_RELEASE_TABLET | Freq: Two times a day (BID) | ORAL | Status: DC
  Administered 2017-07-10 – 2017-07-12 (×6): 20 meq via ORAL
  Filled 2017-07-10 (×6): qty 1

## 2017-07-10 MED ORDER — GUAIFENESIN 100 MG/5ML PO SOLN
5.0000 mL | ORAL | Status: DC | PRN
  Administered 2017-07-10 – 2017-07-11 (×5): 100 mg via ORAL
  Filled 2017-07-10 (×6): qty 5

## 2017-07-10 NOTE — Progress Notes (Signed)
Patient ID: Kathy Long, female   DOB: May 05, 1951, 67 y.o.   MRN: [id number]      [name].   Interval History: Patient seen and examined, no acute events or new complaints overnight. Patient reports improved pain. Mild stomach upset but feeling better now. Tolerated breakfast. Denies fever, nausea or vomiting.   Vital signs in last 24 hours: [min-max] current  Temp:  [98.7 F (37.1 C)] 98.7 F (37.1 C) (02/10 0504) Pulse Rate:  [73-90] 73 (02/10 0504) Resp:  [18] 18 (02/09 2206) BP: (127-142)/(65-84) 138/84 (02/10 0823) SpO2:  [91 %-95 %] 91 % (02/10 0504) Weight:  [79.5 kg (175 lb 3.2 oz)] 79.5 kg (175 lb 3.2 oz) (02/10 0500)     Height: 5' 4"  (162.6 cm) Weight: 79.5 kg (175 lb 3.2 oz) BMI (Calculated): 30.06    Physical Exam:  Constitutional: alert, cooperative and no distress  Respiratory: breathing non-labored at rest  Cardiovascular: regular rate and sinus rhythm  Gastrointestinal: soft, non-tender, and non-distended  Labs:  CBC Latest Ref Rng & Units 07/10/2017 07/09/2017 07/08/2017  WBC 3.6 - 11.0 K/uL 15.4(H) 13.1(H) 14.8(H)  Hemoglobin 12.0 - 16.0 g/dL 11.7(L) 12.1 14.4  Hematocrit 35.0 - 47.0 % 35.5 36.5 44.0  Platelets 150 - 440 K/uL 287 253 322   CMP Latest Ref Rng & Units 07/10/2017 07/09/2017 07/08/2017  Glucose 65 - 99 mg/dL 107(H) 98 133(H)  BUN 6 - 20 mg/dL 11 17 26(H)  Creatinine 0.44 - 1.00 mg/dL 0.86 0.99 1.50(H)  Sodium 135 - 145 mmol/L 142 141 141  Potassium 3.5 - 5.1 mmol/L 2.9(L) 3.5 4.4  Chloride 101 - 111 mmol/L 107 105 102  CO2 22 - 32 mmol/L 24 25 25   Calcium 8.9 - 10.3 mg/dL 8.0(L) 8.4(L) 9.6  Total Protein 6.5 - 8.1 g/dL 5.9(L) 5.9(L) 7.3  Total Bilirubin 0.3 - 1.2 mg/dL 1.7(H) 2.1(H) 3.1(H)  Alkaline Phos 38 - 126 U/L 316(H) 204(H) 209(H)  AST 15 - 41 U/L 40 34 61(H)  ALT 14 - 54 U/L 52 56(H) 92(H)   Imaging studies: HIDA scan reviewed consistent with th suspect bile leak.   Assessment/Plan:  67 y.o. female  with bile leak s/p laparoscopic cholecystectomy for acute hemorrhagic cholecystitis. Patient with improved abdominal pain after hydration and antibiotic therapy. Today with elevated Alk phos and WBC with decreased bilirubin. Will keep patient with IV antibiotic and adequate hydration. Will keep same diet. Will add potassium supplement due to a decrease in K level. Will contact Dr. Allen Norris service tomorrow for evaluation for ERCP.   All of the above findings and recommendations were discussed with the patient, patient's husband, and the medical team, and all of patient's and family's questions were answered to heir expressed satisfaction.  Arnold Long, MD

## 2017-07-11 DIAGNOSIS — R1013 Epigastric pain: Secondary | ICD-10-CM

## 2017-07-11 DIAGNOSIS — K839 Disease of biliary tract, unspecified: Secondary | ICD-10-CM

## 2017-07-11 LAB — COMPREHENSIVE METABOLIC PANEL
ALT: 47 U/L (ref 14–54)
AST: 37 U/L (ref 15–41)
Albumin: 2.2 g/dL — ABNORMAL LOW (ref 3.5–5.0)
Alkaline Phosphatase: 315 U/L — ABNORMAL HIGH (ref 38–126)
Anion gap: 10 (ref 5–15)
BUN: 8 mg/dL (ref 6–20)
CO2: 24 mmol/L (ref 22–32)
Calcium: 8.1 mg/dL — ABNORMAL LOW (ref 8.9–10.3)
Chloride: 106 mmol/L (ref 101–111)
Creatinine, Ser: 0.87 mg/dL (ref 0.44–1.00)
GFR calc Af Amer: >60 mL/min (ref >60)
GFR calc non Af Amer: >60 mL/min (ref >60)
Glucose, Bld: 101 mg/dL — ABNORMAL HIGH (ref 65–99)
Potassium: 3.1 mmol/L — ABNORMAL LOW (ref 3.5–5.1)
Sodium: 140 mmol/L (ref 135–145)
Total Bilirubin: 1.2 mg/dL (ref 0.3–1.2)
Total Protein: 5.6 g/dL — ABNORMAL LOW (ref 6.5–8.1)

## 2017-07-11 LAB — BILIRUBIN, DIRECT: Bilirubin, Direct: 0.4 mg/dL (ref 0.1–0.5)

## 2017-07-11 LAB — CBC WITH DIFFERENTIAL/PLATELET
Basophils Absolute: 0 10*3/uL (ref 0–0.1)
Basophils Relative: 0 %
Eosinophils Absolute: 0.5 10*3/uL (ref 0–0.7)
Eosinophils Relative: 4 %
HCT: 32.5 % — ABNORMAL LOW (ref 35.0–47.0)
Hemoglobin: 11 g/dL — ABNORMAL LOW (ref 12.0–16.0)
Lymphocytes Relative: 6 %
Lymphs Abs: 0.8 10*3/uL — ABNORMAL LOW (ref 1.0–3.6)
MCH: 31 pg (ref 26.0–34.0)
MCHC: 33.8 g/dL (ref 32.0–36.0)
MCV: 91.8 fL (ref 80.0–100.0)
Monocytes Absolute: 1.2 10*3/uL — ABNORMAL HIGH (ref 0.2–0.9)
Monocytes Relative: 9 %
Neutro Abs: 10.6 10*3/uL — ABNORMAL HIGH (ref 1.4–6.5)
Neutrophils Relative %: 81 %
Platelets: 327 10*3/uL (ref 150–440)
RBC: 3.53 MIL/uL — ABNORMAL LOW (ref 3.80–5.20)
RDW: 13.7 % (ref 11.5–14.5)
WBC: 13.1 10*3/uL — ABNORMAL HIGH (ref 3.6–11.0)

## 2017-07-11 MED ORDER — SODIUM CHLORIDE 0.9 % IV SOLN
1.0000 g | Freq: Three times a day (TID) | INTRAVENOUS | Status: DC
  Administered 2017-07-11 – 2017-07-12 (×3): 1 g via INTRAVENOUS
  Filled 2017-07-11 (×7): qty 1

## 2017-07-11 MED ORDER — ENOXAPARIN SODIUM 40 MG/0.4ML ~~LOC~~ SOLN
40.0000 mg | SUBCUTANEOUS | Status: DC
Start: 2017-07-11 — End: 2017-07-12
  Administered 2017-07-11: 40 mg via SUBCUTANEOUS
  Filled 2017-07-11: qty 0.4

## 2017-07-11 NOTE — Consult Note (Signed)
Vonda Antigua, MD [address] [place], [place], [place], [address] [address], [place], [place], [place], [address] Phone: [phone]  Fax: [phone]  Consultation  Referring Provider:     Dr. Bary Castilla Primary Care Physician:  [name], MD Primary Gastroenterologist:  Dr. Bonna Gains         Reason for Consultation: Abdominal pain, bile leak     Date of Admission:  07/08/2017 Date of Consultation:  07/11/2017         HPI:   Kathy Long is a 67 y.o. female presents with possible bile leak status post cholecystectomy on July 05, 2017.  Patient was admitted with acute cholecystitis and underwent lap scopic cholecystectomy on July 05, 2017.  After returning home, she called her surgeon 1-2 days later due to midepigastric abdominal pain, and shortness of breath.  She was evaluated in clinic, and sent to the ER to obtain a CT scan to evaluate for blood or fluid around the liver.  CT showed fluid within Morison's pouch, concerning for bile leak.  HIDA scan was compatible with a bile leak as well.  Patient reported 8/10, sharp, intermittent, midepigastric pain with radiation to the right upper quadrant for 4-5 days.  No nausea vomiting.  Patient has been receiving Tylenol while in pain.  She is tolerating an oral diet without nausea or vomiting.  No fever or chills.  Pain has overall improved since admission.  Labs show elevated alk phos, 315 today.  Past Medical History:  Diagnosis Date  . Acoustic neuroma (HCC)    left ear  . Cancer (HCC)    melanoma, shoulder  . Depression   . Hyperlipidemia   . Hypertension     Past Surgical History:  Procedure Laterality Date  . APPENDECTOMY  2010  . BACK SURGERY     disectomy lumbar, scar tissue  . CHOLECYSTECTOMY N/A 07/05/2017   Procedure: LAPAROSCOPIC CHOLECYSTECTOMY WITH INTRAOPERATIVE CHOLANGIOGRAM;  Surgeon: Robert Bellow, MD;  Location: [facility] ORS;  Service: General;  Laterality: N/A;  . COLONOSCOPY     2009  and color gaurd in 2018  . FOOT SURGERY     x 2  . MELANOMA EXCISION Left 2000  . PALATOPLASTY N/A 04/08/2015   Procedure: PALATOPLASTY;  Surgeon: Beverly Gust, MD;  Location: [facility] ORS;  Service: ENT;  Laterality: N/A;  . TONSILLECTOMY N/A 04/08/2015   Procedure: TONSILLECTOMY;  Surgeon: Beverly Gust, MD;  Location: [facility] ORS;  Service: ENT;  Laterality: N/A;  . TUBAL LIGATION      Prior to Admission medications   Medication Sig Start Date End Date Taking? Authorizing Provider  b complex vitamins tablet Take 1 tablet by mouth daily.   Yes [provider]  Cholecalciferol 1000 UNITS tablet Take 1,000 Units by mouth 2 (two) times daily. 11/02/10  Yes [provider]  citalopram (CELEXA) 20 MG tablet TAKE ONE TABLET EVERY DAY 06/27/17  Yes [name], MD  metoprolol succinate (TOPROL-XL) 50 MG 24 hr tablet TAKE ONE TABLET BY MOUTH EVERY DAY 06/27/17  Yes [name], MD  mirabegron ER (MYRBETRIQ) 25 MG TB24 tablet Take 1 tablet (25 mg total) by mouth daily. 02/06/17  Yes [name], MD  omeprazole (PRILOSEC) 40 MG capsule TAKE 1 CAPSULE TWICE DAILY NEEDS APPT FOR MORE REFILLS Patient taking differently: TAKE 1 CAPSULE BY MOUTH EVERY MORNING 12/14/16  Yes [name], MD  rosuvastatin (CRESTOR) 10 MG tablet TAKE ONE TABLET EVERY DAY 03/08/17  Yes  [name], MD  vitamin E 400 UNIT capsule Take 400 Units by mouth daily.   Yes [provider]  amLODipine (NORVASC) 5 MG tablet Take 1 tablet (5 mg total) by mouth daily. MUST KEEP APPT IN January FOR FURTHER REFILLS 05/17/16   [name], MD  HYDROcodone-acetaminophen (NORCO/VICODIN) 5-325 MG tablet Take 1-2 tablets by mouth every 4 (four) hours as needed for moderate pain. Patient not taking: Reported on 07/08/2017 07/06/17   Bettey Costa, MD  Melatonin 3 MG CAPS Take 3 mg by mouth at bedtime.    [provider]  promethazine (PHENERGAN) 25 MG tablet Take 1 tablet (25 mg total) by mouth every 4  (four) hours as needed for nausea or vomiting. Patient not taking: Reported on 07/08/2017 07/07/17   Robert Bellow, MD    Family History  Problem Relation Age of Onset  . Cancer Sister 95       ovarian ca     Social History   Tobacco Use  . Smoking status: Never Smoker  . Smokeless tobacco: Never Used  Substance Use Topics  . Alcohol use: Yes    Alcohol/week: 0.0 oz    Comment: occ  . Drug use: No    Allergies as of 07/08/2017 - Review Complete 07/08/2017  Allergen Reaction Noted  . Morphine and related Other (See Comments) 07/05/2017  . Tramadol Other (See Comments) 09/25/2014  . Tape Rash 09/25/2014    Review of Systems:    All systems reviewed and negative except where noted in HPI.   Physical Exam:  Vital signs in last 24 hours: Temp:  [98.2 F (36.8 C)-98.6 F (37 C)] 98.2 F (36.8 C) (02/11 0612) Pulse Rate:  [75-81] 81 (02/11 0612) Resp:  [16-18] 16 (02/11 0612) BP: (130-159)/(71-73) 130/73 (02/11 0612) SpO2:  [93 %-95 %] 93 % (02/11 0612) Weight:  [173 lb 12.8 oz (78.8 kg)] 173 lb 12.8 oz (78.8 kg) (02/11 0500) Last BM Date: 07/09/17 General:   Pleasant, cooperative in NAD Head:  Normocephalic and atraumatic. Eyes:   No icterus.   Conjunctiva pink. PERRLA. Ears:  Normal auditory acuity. Neck:  Supple; no masses or thyroidomegaly Lungs: Respirations even and unlabored. Lungs clear to auscultation bilaterally.   No wheezes, crackles, or rhonchi.  Heart:  Regular rate and rhythm;  Without murmur, clicks, rubs or gallops Abdomen:  Soft, nondistended, tender to palpation, midepigastric and right upper quadrant. Normal bowel sounds. No appreciable masses or hepatomegaly.  No rebound or guarding.  Rectal:  Not performed. Msk:  Symmetrical without gross deformities.  Strength 5/5 UE and LE b/l  Extremities:  Without edema, cyanosis or clubbing. Neurologic:  Alert and oriented x3;  grossly normal neurologically. Skin:  Intact without significant lesions or  rashes. Cervical Nodes:  No significant cervical adenopathy. Psych:  Alert and cooperative. Normal affect.  LAB RESULTS: Recent Labs    07/09/17 0714 07/10/17 0442 07/11/17 0752  WBC 13.1* 15.4* 13.1*  HGB 12.1 11.7* 11.0*  HCT 36.5 35.5 32.5*  PLT 253 287 327   BMET Recent Labs    07/09/17 0714 07/10/17 0442 07/11/17 0752  NA 141 142 140  K 3.5 2.9* 3.1*  CL 105 107 106  CO2 25 24 24   GLUCOSE 98 107* 101*  BUN 17 11 8   CREATININE 0.99 0.86 0.87  CALCIUM 8.4* 8.0* 8.1*   LFT Recent Labs    07/11/17 0752  PROT 5.6*  ALBUMIN 2.2*  AST 37  ALT 47  ALKPHOS  315*  BILITOT 1.2  BILIDIR 0.4   PT/INR No results for input(s): LABPROT, INR in the last 72 hours.  STUDIES: No results found.    Impression / Plan:   Kathy Long is a 67 y.o. y/o female with lap scopic cholecystectomy on July 09, 2017 for acute cholecystitis, who developed abdominal pain and shortness of breath 1-2 days after the procedure, and imaging consistent with bile leak  Overall pain is improved but still present Alk phos has been increasing, and is 315 today Patient is on antibiotics and does not have a fever She will need an ERCP for stent placement due to her bile leak She is tolerating oral diet and does not have any alarm symptoms ERCP scheduled for tomorrow as patient ate today.  This will be done by Dr. Allen Norris N.p.o. past midnight Continue antibiotics We will continue to follow  Thank you for involving me in the care of this patient.      LOS: 3 days   Virgel Manifold, MD  07/11/2017, 1:58 PM

## 2017-07-11 NOTE — Progress Notes (Signed)
Patient ID: Kathy Long, female   DOB: March 17, 1951, 67 y.o.   MRN: [id number]     [facility] Day(s): 3.   Interval History: Patient seen and examined, no acute events or new complaints overnight. Patient denies abdominal pain, nausea or vomiting. .  Vital signs in last 24 hours: [min-max] current  Temp:  [98 F (36.7 C)-98.6 F (37 C)] 98.2 F (36.8 C) (02/11 0612) Pulse Rate:  [74-81] 81 (02/11 0612) Resp:  [16-19] 16 (02/11 0612) BP: (130-159)/(71-78) 130/73 (02/11 0612) SpO2:  [92 %-95 %] 93 % (02/11 0612) Weight:  [78.8 kg (173 lb 12.8 oz)] 78.8 kg (173 lb 12.8 oz) (02/11 0500)     Height: 5' 4"  (162.6 cm) Weight: 78.8 kg (173 lb 12.8 oz) BMI (Calculated): 29.82     Physical Exam:  Constitutional: alert, cooperative and no distress  Respiratory: breathing non-labored at rest  Cardiovascular: regular rate and sinus rhythm  Gastrointestinal: soft, non-tender, and non-distended  Labs:  CBC Latest Ref Rng & Units 07/11/2017 07/10/2017 07/09/2017  WBC 3.6 - 11.0 K/uL 13.1(H) 15.4(H) 13.1(H)  Hemoglobin 12.0 - 16.0 g/dL 11.0(L) 11.7(L) 12.1  Hematocrit 35.0 - 47.0 % 32.5(L) 35.5 36.5  Platelets 150 - 440 K/uL 327 287 253   CMP Latest Ref Rng & Units 07/11/2017 07/10/2017 07/09/2017  Glucose 65 - 99 mg/dL 101(H) 107(H) 98  BUN 6 - 20 mg/dL 8 11 17   Creatinine 0.44 - 1.00 mg/dL 0.87 0.86 0.99  Sodium 135 - 145 mmol/L 140 142 141  Potassium 3.5 - 5.1 mmol/L 3.1(L) 2.9(L) 3.5  Chloride 101 - 111 mmol/L 106 107 105  CO2 22 - 32 mmol/L 24 24 25   Calcium 8.9 - 10.3 mg/dL 8.1(L) 8.0(L) 8.4(L)  Total Protein 6.5 - 8.1 g/dL 5.6(L) 5.9(L) 5.9(L)  Total Bilirubin 0.3 - 1.2 mg/dL 1.2 1.7(H) 2.1(H)  Alkaline Phos 38 - 126 U/L 315(H) 316(H) 204(H)  AST 15 - 41 U/L 37 40 34  ALT 14 - 54 U/L 47 52 56(H)    Assessment/Plan:  67 y.o. female with bile leak s/p laparoscopic cholecystectomy for acute hemorrhagic cholecystitis. Patient today without worsening clinical status.  Continue with elevated Alk phos with improved bilirubin. WBC decreased from 15 to 13 (still elevated). Will continue with IV abx therapy, hydration and consult GI for evaluation for possible ERCP.  Arnold Long, MD

## 2017-07-12 ENCOUNTER — Encounter: Payer: Self-pay | Admitting: Anesthesiology

## 2017-07-12 ENCOUNTER — Encounter
Admission: AD | Disposition: A | Discharge status: Home / Self Care | Payer: Self-pay | Source: Ambulatory Visit | Attending: General Surgery

## 2017-07-12 ENCOUNTER — Inpatient Hospital Stay: Payer: Medicare Other | Attending: Gastroenterology

## 2017-07-12 ENCOUNTER — Inpatient Hospital Stay: Payer: Self-pay | Admitting: Anesthesiology

## 2017-07-12 DIAGNOSIS — K9189 Other postprocedural complications and disorders of digestive system: Secondary | ICD-10-CM

## 2017-07-12 DIAGNOSIS — K838 Other specified diseases of biliary tract: Secondary | ICD-10-CM

## 2017-07-12 HISTORY — PX: ERCP: SHX5425

## 2017-07-12 LAB — BASIC METABOLIC PANEL
Anion gap: 9 (ref 5–15)
BUN: 7 mg/dL (ref 6–20)
CO2: 25 mmol/L (ref 22–32)
Calcium: 8.2 mg/dL — ABNORMAL LOW (ref 8.9–10.3)
Chloride: 105 mmol/L (ref 101–111)
Creatinine, Ser: 1 mg/dL (ref 0.44–1.00)
GFR calc Af Amer: >60 mL/min (ref >60)
GFR calc non Af Amer: 56 mL/min — ABNORMAL LOW (ref >60)
Glucose, Bld: 96 mg/dL (ref 65–99)
Potassium: 3.3 mmol/L — ABNORMAL LOW (ref 3.5–5.1)
Sodium: 139 mmol/L (ref 135–145)

## 2017-07-12 LAB — C DIFFICILE QUICK SCREEN W PCR REFLEX
C Diff antigen: NEGATIVE
C Diff interpretation: NOT DETECTED
C Diff toxin: NEGATIVE

## 2017-07-12 SURGERY — ERCP, WITH INTERVENTION IF INDICATED
Anesthesia: General

## 2017-07-12 MED ORDER — LOPERAMIDE HCL 2 MG PO CAPS
2.0000 mg | ORAL_CAPSULE | ORAL | Status: DC | PRN
  Administered 2017-07-12 (×2): 2 mg via ORAL
  Filled 2017-07-12 (×2): qty 1

## 2017-07-12 MED ORDER — SODIUM CHLORIDE 0.9 % IV SOLN
INTRAVENOUS | Status: DC
  Administered 2017-07-12: 12:00:00 via INTRAVENOUS

## 2017-07-12 MED ORDER — PROPOFOL 10 MG/ML IV BOLUS
INTRAVENOUS | Status: DC | PRN
  Administered 2017-07-12: 80 mg via INTRAVENOUS

## 2017-07-12 MED ORDER — PROPOFOL 500 MG/50ML IV EMUL
INTRAVENOUS | Status: DC | PRN
  Administered 2017-07-12: 75 ug/kg/min via INTRAVENOUS

## 2017-07-12 MED ORDER — WITCH HAZEL-GLYCERIN EX PADS
MEDICATED_PAD | CUTANEOUS | Status: DC | PRN
  Administered 2017-07-12: 1 via TOPICAL
  Filled 2017-07-12: qty 100

## 2017-07-12 MED ORDER — FENTANYL CITRATE (PF) 100 MCG/2ML IJ SOLN
INTRAMUSCULAR | Status: DC | PRN
  Administered 2017-07-12 (×2): 50 ug via INTRAVENOUS

## 2017-07-12 MED ORDER — INDOMETHACIN 50 MG RE SUPP
RECTAL | Status: AC
  Filled 2017-07-12: qty 2

## 2017-07-12 MED ORDER — INDOMETHACIN 50 MG RE SUPP: 100.0000 mg | Freq: Once | RECTAL | Status: DC

## 2017-07-12 MED ORDER — LIDOCAINE HCL (CARDIAC) 20 MG/ML IV SOLN
INTRAVENOUS | Status: DC | PRN
  Administered 2017-07-12: 80 mg via INTRAVENOUS

## 2017-07-12 MED ORDER — PROPOFOL 10 MG/ML IV BOLUS
INTRAVENOUS | Status: AC
  Filled 2017-07-12: qty 40

## 2017-07-12 MED ORDER — INDOMETHACIN 50 MG RE SUPP
100.0000 mg | Freq: Once | RECTAL | Status: AC
  Administered 2017-07-12: 100 mg via RECTAL

## 2017-07-12 MED ORDER — FENTANYL CITRATE (PF) 100 MCG/2ML IJ SOLN
INTRAMUSCULAR | Status: AC
  Filled 2017-07-12: qty 2

## 2017-07-12 NOTE — Care Management Important Message (Signed)
Important Message  Patient Details  Name: Kathy Long MRN: [id number] Date of Birth: 1951-02-11   Medicare Important Message Given:  Yes    Beverly Sessions, RN 07/12/2017, 11:28 AM

## 2017-07-12 NOTE — Progress Notes (Signed)
Per Dr. Windell Moment okay for RN to order imodium and dc enteric precautions. Pt negative for c-diff.

## 2017-07-12 NOTE — Anesthesia Preprocedure Evaluation (Signed)
Anesthesia Evaluation  Patient identified by MRN, date of birth, ID band Patient awake    Reviewed: Allergy & Precautions, H&P , NPO status , Patient's Chart, lab work & pertinent test results  Airway Mallampati: III  TM Distance: <3 FB Neck ROM: full    Dental  (+) Chipped, Poor Dentition   Pulmonary neg pulmonary ROS, neg shortness of breath,           Cardiovascular Exercise Tolerance: Good hypertension, (-) angina(-) Past MI and (-) DOE      Neuro/Psych PSYCHIATRIC DISORDERS Depression  Neuromuscular disease    GI/Hepatic negative GI ROS, Neg liver ROS, neg GERD  ,  Endo/Other  negative endocrine ROS  Renal/GU negative Renal ROS  negative genitourinary   Musculoskeletal   Abdominal   Peds  Hematology negative hematology ROS (+)   Anesthesia Other Findings Past Medical History: No date: Acoustic neuroma (HCC)     Comment:  left ear No date: Cancer ([place])     Comment:  melanoma, shoulder No date: Depression No date: Hyperlipidemia No date: Hypertension  Past Surgical History: 2010: APPENDECTOMY No date: BACK SURGERY     Comment:  disectomy lumbar, scar tissue 07/05/2017: CHOLECYSTECTOMY; N/A     Comment:  Procedure: LAPAROSCOPIC CHOLECYSTECTOMY WITH               INTRAOPERATIVE CHOLANGIOGRAM;  Surgeon: Robert Bellow, MD;  Location: [facility] ORS;  Service: General;                Laterality: N/A; No date: COLONOSCOPY     Comment:  2009 and color gaurd in 2018 No date: FOOT SURGERY     Comment:  x 2 2000: MELANOMA EXCISION; Left 04/08/2015: PALATOPLASTY; N/A     Comment:  Procedure: PALATOPLASTY;  Surgeon: Beverly Gust, MD;               Location: [facility] ORS;  Service: ENT;  Laterality: N/A; 04/08/2015: TONSILLECTOMY; N/A     Comment:  Procedure: TONSILLECTOMY;  Surgeon: Beverly Gust, MD;              Location: [facility] ORS;  Service: ENT;  Laterality: N/A; No date: TUBAL LIGATION  BMI    Body Mass Index:  31.22 kg/m      Reproductive/Obstetrics negative OB ROS                             Anesthesia Physical Anesthesia Plan  ASA: III  Anesthesia Plan: General   Post-op Pain Management:    Induction: Intravenous  PONV Risk Score and Plan: Propofol infusion and TIVA  Airway Management Planned: Natural Airway and Nasal Cannula  Additional Equipment:   Intra-op Plan:   Post-operative Plan:   Informed Consent: I have reviewed the patients History and Physical, chart, labs and discussed the procedure including the risks, benefits and alternatives for the proposed anesthesia with the patient or authorized representative who has indicated his/her understanding and acceptance.   Dental Advisory Given  Plan Discussed with: Anesthesiologist, CRNA and Surgeon  Anesthesia Plan Comments: (Patient consented for risks of anesthesia including but not limited to:  - adverse reactions to medications - risk of intubation if required - damage to teeth, lips or other oral mucosa - sore throat or hoarseness - Damage to heart, brain, lungs or loss of life  Patient voiced understanding.)  Anesthesia Quick Evaluation  

## 2017-07-12 NOTE — Transfer of Care (Signed)
Immediate Anesthesia Transfer of Care Note  Patient: Kathy Long  Procedure(s) Performed: ENDOSCOPIC RETROGRADE CHOLANGIOPANCREATOGRAPHY (ERCP) (N/A )  Patient Location: PACU  Anesthesia Type:General  Level of Consciousness: awake  Airway & Oxygen Therapy: Patient Spontanous Breathing and Patient connected to nasal cannula oxygen  Post-op Assessment: Report given to RN  Post vital signs: Reviewed and stable  Last Vitals:  Vitals:   07/12/17 1038 07/12/17 1317  BP: (!) 165/72   Pulse: (!) 20   Resp: 16 16  Temp: 36.7 C (!) 36.2 C  SpO2: 94%     Last Pain:  Vitals:   07/12/17 1317  TempSrc: Tympanic  PainSc:       Patients Stated Pain Goal: 0 (56/70/14 1030)  Complications: No apparent anesthesia complications

## 2017-07-12 NOTE — Anesthesia Post-op Follow-up Note (Signed)
Anesthesia QCDR form completed.        

## 2017-07-12 NOTE — Anesthesia Postprocedure Evaluation (Signed)
Anesthesia Post Note  Patient: Kathy Long  Procedure(s) Performed: ENDOSCOPIC RETROGRADE CHOLANGIOPANCREATOGRAPHY (ERCP) (N/A )  Patient location during evaluation: Endoscopy Anesthesia Type: General Level of consciousness: awake and alert Pain management: pain level controlled Vital Signs Assessment: post-procedure vital signs reviewed and stable Respiratory status: spontaneous breathing, nonlabored ventilation, respiratory function stable and patient connected to nasal cannula oxygen Cardiovascular status: blood pressure returned to baseline and stable Postop Assessment: no apparent nausea or vomiting Anesthetic complications: no     Last Vitals:  Vitals:   07/12/17 1347 07/12/17 1404  BP: (!) 161/76 (!) 162/71  Pulse: 64 62  Resp: (!) 26 17  Temp:  37.1 C  SpO2: 97% 97%    Last Pain:  Vitals:   07/12/17 1404  TempSrc: Oral  PainSc:                  [name]

## 2017-07-12 NOTE — Progress Notes (Signed)
Patient ID: Kathy Long, female   DOB: 1950/08/21, 67 y.o.   MRN: [id number]     [facility] Day(s): 4.   Post op day(s): Day of Surgery. (ERCP)  Interval History: Patient seen and examined, no acute events or new complaints overnight. Patient had ERCP today. Patient reports feeling well. Had some nausea after procedure but now feels well. Denies abdominal pain. Patient complains of watery diarrhea.   Vital signs in last 24 hours: [min-max] current  Temp:  [97.2 F (36.2 C)-98.7 F (37.1 C)] 98.7 F (37.1 C) (02/12 1404) Pulse Rate:  [20-74] 62 (02/12 1404) Resp:  [16-26] 17 (02/12 1404) BP: (134-172)/(71-86) 162/71 (02/12 1404) SpO2:  [92 %-97 %] 97 % (02/12 1404) Weight:  [82.5 kg (181 lb 14.1 oz)] 82.5 kg (181 lb 14.1 oz) (02/12 0512)     Height: 5\' 4"  (162.6 cm) Weight: 82.5 kg (181 lb 14.1 oz) BMI (Calculated): 31.2   Physical Exam:  Constitutional: alert, cooperative and no distress  Respiratory: breathing non-labored at rest  Cardiovascular: regular rate and sinus rhythm  Gastrointestinal: soft, non-tender, and non-distended  Labs:  CBC Latest Ref Rng & Units 07/11/2017 07/10/2017 07/09/2017  WBC 3.6 - 11.0 K/uL 13.1(H) 15.4(H) 13.1(H)  Hemoglobin 12.0 - 16.0 g/dL 11.0(L) 11.7(L) 12.1  Hematocrit 35.0 - 47.0 % 32.5(L) 35.5 36.5  Platelets 150 - 440 K/uL 327 287 253   CMP Latest Ref Rng & Units 07/12/2017 07/11/2017 07/10/2017  Glucose 65 - 99 mg/dL 96 101(H) 107(H)  BUN 6 - 20 mg/dL 7 8 11   Creatinine 0.44 - 1.00 mg/dL 1.00 0.87 0.86  Sodium 135 - 145 mmol/L 139 140 142  Potassium 3.5 - 5.1 mmol/L 3.3(L) 3.1(L) 2.9(L)  Chloride 101 - 111 mmol/L 105 106 107  CO2 22 - 32 mmol/L 25 24 24   Calcium 8.9 - 10.3 mg/dL 8.2(L) 8.1(L) 8.0(L)  Total Protein 6.5 - 8.1 g/dL - 5.6(L) 5.9(L)  Total Bilirubin 0.3 - 1.2 mg/dL - 1.2 1.7(H)  Alkaline Phos 38 - 126 U/L - 315(H) 316(H)  AST 15 - 41 U/L - 37 40  ALT 14 - 54 U/L - 47 52   Assessment/Plan:  67 y.o.  female with recent cholecystectomy complicated with bile leak. ERCP done today and small leak identified, sphincterotomy and stent placed. Patient now complaining of watery diarrhea. Will order C. Diff to rule out pseudomembranous colitis. If negative and patient new labs in the morning are better, may be discharged tomorrow.   Arnold Long, MD

## 2017-07-12 NOTE — Op Note (Addendum)
[facility] Gastroenterology Patient Name: Kathy Long Procedure Date: 07/12/2017 12:24 PM MRN: [id number] Account #: [id number] Date of Birth: Oct 10, 1950 Admit Type: Inpatient Age: 67 Room: [facility] ENDO ROOM 4 Gender: Female Note Status: Finalized Procedure:            ERCP Indications:          Bile leak Providers:            Lucilla Lame MD, MD Referring MD:         Deborra Medina, MD (Referring MD) Medicines:            Propofol per Anesthesia Complications:        No immediate complications. Procedure:            Pre-Anesthesia Assessment:                       - Prior to the procedure, a History and Physical was                        performed, and patient medications and allergies were                        reviewed. The patient's tolerance of previous                        anesthesia was also reviewed. The risks and benefits of                        the procedure and the sedation options and risks were                        discussed with the patient. All questions were                        answered, and informed consent was obtained. Prior                        Anticoagulants: The patient has taken no previous                        anticoagulant or antiplatelet agents. ASA Grade                        Assessment: II - A patient with mild systemic disease.                        After reviewing the risks and benefits, the patient was                        deemed in satisfactory condition to undergo the                        procedure.                       After obtaining informed consent, the scope was passed                        under direct vision. Throughout the procedure, the  patient's blood pressure, pulse, and oxygen saturations                        were monitored continuously. The ERCP was introduced                        through the mouth, and used to inject contrast into and                        used to  cannulate the bile duct. The ERCP was                        accomplished without difficulty. The patient tolerated                        the procedure well. Findings:      A scout film of the abdomen was obtained. Surgical clips were seen in       the area of the cystic duct. 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 The bile duct was deeply cannulated with the short-nosed       traction sphincterotome. Contrast was injected. I personally interpreted       the bile duct images. There was brisk flow of contrast through the       ducts. Image quality was excellent. Contrast extended to the entire       biliary tree. A wire was passed into the biliary tree. A 6 mm biliary       sphincterotomy was made with a traction (standard) sphincterotome using       ERBE electrocautery. There was no post-sphincterotomy bleeding. One 10       Fr by 7 cm plastic stent with a single external flap and a single       internal flap was placed into the common bile duct. Bile flowed through       the stent. The stent was in good position. 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                       - A biliary sphincterotomy was performed.                       - One plastic stent was placed into the common bile                        duct.                       - No leak was seen but it may be slow enough to cause                        problems and a stent was placed. Recommendation:       - Return patient to hospital ward for ongoing care.                       - Clear liquid diet today. Procedure Code(s):    --- Professional ---  99371, Endoscopic retrograde cholangiopancreatography                        (ERCP); with placement of endoscopic stent into biliary                        or pancreatic duct, including pre- and post-dilation                        and guide wire passage, when performed, including                         sphincterotomy, when performed, each stent                       69678, Endoscopic catheterization of the biliary ductal                        system, radiological supervision and interpretation Diagnosis Code(s):    --- Professional ---                       K83.8, Other specified diseases of biliary tract CPT copyright 2016 American Medical Association. All rights reserved. The codes documented in this report are preliminary and upon coder review may  be revised to meet current compliance requirements. Lucilla Lame MD, MD 07/12/2017 1:07:14 PM This report has been signed electronically. Number of Addenda: 0 Note Initiated On: 07/12/2017 12:24 PM      [facility]

## 2017-07-13 ENCOUNTER — Encounter: Payer: Self-pay | Admitting: Gastroenterology

## 2017-07-13 LAB — COMPREHENSIVE METABOLIC PANEL
ALT: 56 U/L — ABNORMAL HIGH (ref 14–54)
AST: 50 U/L — ABNORMAL HIGH (ref 15–41)
Albumin: 2.3 g/dL — ABNORMAL LOW (ref 3.5–5.0)
Alkaline Phosphatase: 314 U/L — ABNORMAL HIGH (ref 38–126)
Anion gap: 9 (ref 5–15)
BUN: 7 mg/dL (ref 6–20)
CO2: 24 mmol/L (ref 22–32)
Calcium: 8.4 mg/dL — ABNORMAL LOW (ref 8.9–10.3)
Chloride: 109 mmol/L (ref 101–111)
Creatinine, Ser: 0.7 mg/dL (ref 0.44–1.00)
GFR calc Af Amer: >60 mL/min (ref >60)
GFR calc non Af Amer: >60 mL/min (ref >60)
Glucose, Bld: 99 mg/dL (ref 65–99)
Potassium: 3.5 mmol/L (ref 3.5–5.1)
Sodium: 142 mmol/L (ref 135–145)
Total Bilirubin: 1 mg/dL (ref 0.3–1.2)
Total Protein: 5.8 g/dL — ABNORMAL LOW (ref 6.5–8.1)

## 2017-07-13 LAB — CBC WITH DIFFERENTIAL/PLATELET
Basophils Absolute: 0 10*3/uL (ref 0–0.1)
Basophils Relative: 0 %
Eosinophils Absolute: 0.4 10*3/uL (ref 0–0.7)
Eosinophils Relative: 5 %
HCT: 35.1 % (ref 35.0–47.0)
Hemoglobin: 11.8 g/dL — ABNORMAL LOW (ref 12.0–16.0)
Lymphocytes Relative: 14 %
Lymphs Abs: 1.1 10*3/uL (ref 1.0–3.6)
MCH: 31 pg (ref 26.0–34.0)
MCHC: 33.7 g/dL (ref 32.0–36.0)
MCV: 91.8 fL (ref 80.0–100.0)
Monocytes Absolute: 0.9 10*3/uL (ref 0.2–0.9)
Monocytes Relative: 12 %
Neutro Abs: 5.5 10*3/uL (ref 1.4–6.5)
Neutrophils Relative %: 69 %
Platelets: 412 10*3/uL (ref 150–440)
RBC: 3.82 MIL/uL (ref 3.80–5.20)
RDW: 13.7 % (ref 11.5–14.5)
WBC: 7.9 10*3/uL (ref 3.6–11.0)

## 2017-07-13 LAB — GLUCOSE, CAPILLARY: Glucose-Capillary: 113 mg/dL — ABNORMAL HIGH (ref 65–99)

## 2017-07-13 NOTE — Discharge Instructions (Signed)
°  Diet: Resume home heart healthy regular diet.   Activity: No heavy lifting > 20 pounds (children, pets, laundry, garbage) or strenuous activity until follow-up with Dr. Bary Castilla, but light activity and walking are encouraged. Do not drive or drink alcohol if taking narcotic pain medications.  Wound care: May shower with soapy water and pat dry (do not rub incisions), but no baths or submerging incision underwater until follow-up. (no swimming)   Medications: Resume all home medications. For mild to moderate pain: acetaminophen (Tylenol) or ibuprofen (if no kidney disease). Combining Tylenol with alcohol can substantially increase your risk of causing liver disease. Narcotic pain medications, if prescribed, can be used for severe pain, though may cause nausea, constipation, and drowsiness. Do not combine Tylenol and Percocet within a 6 hour period as Percocet contains Tylenol. If you do not need the narcotic pain medication, you do not need to fill the prescription.  Call office [phone]) at any time if any questions, worsening pain, fevers/chills, bleeding, drainage from incision site, or other concerns. Dr. Bary Castilla will get in contact with you for follow up appointment.

## 2017-07-13 NOTE — Progress Notes (Signed)
Kathy Long  A and O x 4. VSS. Pt tolerating diet well. No complaints of pain or nausea. IV removed intact, prescriptions given. Pt voiced understanding of discharge instructions with no further questions. Pt discharged via wheelchair with axillary.    Allergies as of 07/13/2017      Reactions   Morphine And Related Other (See Comments)   Chest pain   Tramadol Other (See Comments)   Tape Rash   Other reaction(s): UNKNOWN      Medication List    TAKE these medications   amLODipine 5 MG tablet Commonly known as:  NORVASC Take 1 tablet (5 mg total) by mouth daily. MUST KEEP APPT IN January FOR FURTHER REFILLS   b complex vitamins tablet Take 1 tablet by mouth daily.   Cholecalciferol 1000 units tablet Take 1,000 Units by mouth 2 (two) times daily.   citalopram 20 MG tablet Commonly known as:  CELEXA TAKE ONE TABLET EVERY DAY   HYDROcodone-acetaminophen 5-325 MG tablet Commonly known as:  NORCO/VICODIN Take 1-2 tablets by mouth every 4 (four) hours as needed for moderate pain.   Melatonin 3 MG Caps Take 3 mg by mouth at bedtime.   metoprolol succinate 50 MG 24 hr tablet Commonly known as:  TOPROL-XL TAKE ONE TABLET BY MOUTH EVERY DAY   mirabegron ER 25 MG Tb24 tablet Commonly known as:  MYRBETRIQ Take 1 tablet (25 mg total) by mouth daily.   omeprazole 40 MG capsule Commonly known as:  PRILOSEC TAKE 1 CAPSULE TWICE DAILY NEEDS APPT FOR MORE REFILLS What changed:  See the new instructions.   promethazine 25 MG tablet Commonly known as:  PHENERGAN Take 1 tablet (25 mg total) by mouth every 4 (four) hours as needed for nausea or vomiting.   rosuvastatin 10 MG tablet Commonly known as:  CRESTOR TAKE ONE TABLET EVERY DAY   vitamin E 400 UNIT capsule Take 400 Units by mouth daily.       Vitals:   07/12/17 2026 07/13/17 0504  BP: (!) 142/96 138/67  Pulse: 81 67  Resp: 20 20  Temp: 98.3 F (36.8 C) 98.8 F (37.1 C)  SpO2: 96% 92%    Francesco Sor

## 2017-07-13 NOTE — Final Progress Note (Signed)
[facility] Day(s): 5.   Post op day(s): 1 Day Post-Op.   Interval History: Patient seen and examined, no acute events or new complaints overnight. Patient reports feeling well and tolerating diet. Denies abdominal pain, nausea or vomiting.  Vital signs in last 24 hours: [min-max] current  Temp:  [97.2 F (36.2 C)-98.8 F (37.1 C)] 98.8 F (37.1 C) (02/13 0504) Pulse Rate:  [20-81] 67 (02/13 0504) Resp:  [16-26] 20 (02/13 0504) BP: (138-165)/(67-96) 138/67 (02/13 0504) SpO2:  [92 %-97 %] 92 % (02/13 0504) Weight:  [78.7 kg (173 lb 8 oz)] 78.7 kg (173 lb 8 oz) (02/13 0500)     Height: 5\' 4"  (162.6 cm) Weight: 78.7 kg (173 lb 8 oz) BMI (Calculated): 29.77    Physical Exam:  Constitutional: alert, cooperative and no distress  Respiratory: breathing non-labored at rest  Cardiovascular: regular rate and sinus rhythm  Gastrointestinal: soft, non-tender, and non-distended, wounds are dry and clean.   Labs:  CBC Latest Ref Rng & Units 07/13/2017 07/11/2017 07/10/2017  WBC 3.6 - 11.0 K/uL 7.9 13.1(H) 15.4(H)  Hemoglobin 12.0 - 16.0 g/dL 11.8(L) 11.0(L) 11.7(L)  Hematocrit 35.0 - 47.0 % 35.1 32.5(L) 35.5  Platelets 150 - 440 K/uL 412 327 287   CMP Latest Ref Rng & Units 07/13/2017 07/12/2017 07/11/2017  Glucose 65 - 99 mg/dL 99 96 101(H)  BUN 6 - 20 mg/dL 7 7 8   Creatinine 0.44 - 1.00 mg/dL 0.70 1.00 0.87  Sodium 135 - 145 mmol/L 142 139 140  Potassium 3.5 - 5.1 mmol/L 3.5 3.3(L) 3.1(L)  Chloride 101 - 111 mmol/L 109 105 106  CO2 22 - 32 mmol/L 24 25 24   Calcium 8.9 - 10.3 mg/dL 8.4(L) 8.2(L) 8.1(L)  Total Protein 6.5 - 8.1 g/dL 5.8(L) - 5.6(L)  Total Bilirubin 0.3 - 1.2 mg/dL 1.0 - 1.2  Alkaline Phos 38 - 126 U/L 314(H) - 315(H)  AST 15 - 41 U/L 50(H) - 37  ALT 14 - 54 U/L 56(H) - 47    Assessment/Plan:  67 y.o. female with bile leak after cholecystectomy status post ERCP with stent placement yesterday. Patient tolerated procedure well. No abdominal pain  and tolerating diet. Patient ambulating. Potassium normalized. Bilirubin decreased to 1.0 and no leukocytosis. Patient oriented about procedure and about plan of care. Will discharge home and will be followed next week with Dr. Bary Castilla.   Arnold Long, MD

## 2017-07-13 NOTE — Discharge Summary (Signed)
Physician Discharge Summary  Patient ID: Kathy Long MRN: [id number] DOB/AGE: [birth year] 67 y.o.  Admit date: 07/08/2017 Discharge date: 07/13/2017  Admission Diagnoses: Leakage of common bile duct  Discharge Diagnoses:  Active Problems:   Abdominal pain, epigastric   Dehydration   Leakage of common bile duct   Discharged Condition: good  Hospital Course: Patient admitted with abdominal pain and leukocytosis. Pain improved with hydration and IV antibiotics. HIDA scan showed bile leak. GI service place stent through ERCP. Patient today with no abdominal pain. Tolerated diet. Bilirubin at 1, no leukocytosis.   Consults: Gastroenterology serivice  Significant Diagnostic Studies: HIDA Scan, ERCP  Treatments: IV hydration, antibiotics: Fortaz and ERCP with stent placement  Discharge Exam: Blood pressure 138/67, pulse 67, temperature 98.8 F (37.1 C), temperature source Oral, resp. rate 20, height 5\' 4"  (1.626 m), weight 78.7 kg (173 lb 8 oz), SpO2 92 %. General appearance: alert and cooperative Resp: clear to auscultation bilaterally Cardio: regular rate and rhythm, S1, S2 normal, no murmur, click, rub or gallop GI: soft, non-tender; bowel sounds normal; no masses,  no organomegaly Incision/Wound:dry and clean. Steri strips in place  Disposition: 01-Home or Self Care  Discharge Instructions    Diet - low sodium heart healthy   Complete by:  As directed    Increase activity slowly   Complete by:  As directed      Allergies as of 07/13/2017      Reactions   Morphine And Related Other (See Comments)   Chest pain   Tramadol Other (See Comments)   Tape Rash   Other reaction(s): UNKNOWN      Medication List    TAKE these medications   amLODipine 5 MG tablet Commonly known as:  NORVASC Take 1 tablet (5 mg total) by mouth daily. MUST KEEP APPT IN January FOR FURTHER REFILLS   b complex vitamins tablet Take 1 tablet by mouth daily.   Cholecalciferol 1000 units  tablet Take 1,000 Units by mouth 2 (two) times daily.   citalopram 20 MG tablet Commonly known as:  CELEXA TAKE ONE TABLET EVERY DAY   HYDROcodone-acetaminophen 5-325 MG tablet Commonly known as:  NORCO/VICODIN Take 1-2 tablets by mouth every 4 (four) hours as needed for moderate pain.   Melatonin 3 MG Caps Take 3 mg by mouth at bedtime.   metoprolol succinate 50 MG 24 hr tablet Commonly known as:  TOPROL-XL TAKE ONE TABLET BY MOUTH EVERY DAY   mirabegron ER 25 MG Tb24 tablet Commonly known as:  MYRBETRIQ Take 1 tablet (25 mg total) by mouth daily.   omeprazole 40 MG capsule Commonly known as:  PRILOSEC TAKE 1 CAPSULE TWICE DAILY NEEDS APPT FOR MORE REFILLS What changed:  See the new instructions.   promethazine 25 MG tablet Commonly known as:  PHENERGAN Take 1 tablet (25 mg total) by mouth every 4 (four) hours as needed for nausea or vomiting.   rosuvastatin 10 MG tablet Commonly known as:  CRESTOR TAKE ONE TABLET EVERY DAY   vitamin E 400 UNIT capsule Take 400 Units by mouth daily.        Signed: Herbert Pun 07/13/2017, 7:18 AM

## 2017-07-14 ENCOUNTER — Inpatient Hospital Stay: Payer: Medicare Other | Admitting: Internal Medicine

## 2017-07-14 ENCOUNTER — Telehealth: Payer: Self-pay | Admitting: *Deleted

## 2017-07-14 NOTE — Telephone Encounter (Signed)
Transition Care Management Follow-up Telephone Call  How have you been since you were released from the hospital? Patient stated she is feeling much better just not able to eat except for small frequent meals.   Do you understand why you were in the hospital? yes   Do you understand the discharge instrcutions? yes  Items Reviewed:  Medications reviewed: yes  Allergies reviewed: yes  Dietary changes reviewed: yes  Referrals reviewed: yes   Functional Questionnaire:   Activities of Daily Living (ADLs):   She states they are independent in the following: ambulation, bathing and hygiene, feeding, continence, grooming, toileting and dressing States they require assistance with the following: No assistance at this time .   Any transportation issues/concerns?: no   Any patient concerns? no   Confirmed importance and date/time of follow-up visits scheduled: yes   Confirmed with patient if condition begins to worsen call PCP or go to the ER.  Patient was given the Call-a-Nurse line [phone]: yes

## 2017-07-18 ENCOUNTER — Ambulatory Visit: Payer: Medicare Other | Admitting: Internal Medicine

## 2017-07-18 ENCOUNTER — Encounter: Payer: Self-pay | Admitting: Internal Medicine

## 2017-07-18 VITALS — BP 110/86 | HR 64 | Temp 97.5°F | Resp 15 | Ht 64.0 in | Wt 162.0 lb

## 2017-07-18 DIAGNOSIS — K838 Other specified diseases of biliary tract: Secondary | ICD-10-CM

## 2017-07-18 DIAGNOSIS — K810 Acute cholecystitis: Secondary | ICD-10-CM

## 2017-07-18 DIAGNOSIS — Z9049 Acquired absence of other specified parts of digestive tract: Secondary | ICD-10-CM | POA: Diagnosis not present

## 2017-07-18 DIAGNOSIS — M546 Pain in thoracic spine: Secondary | ICD-10-CM

## 2017-07-18 DIAGNOSIS — Z09 Encounter for follow-up examination after completed treatment for conditions other than malignant neoplasm: Secondary | ICD-10-CM

## 2017-07-18 DIAGNOSIS — E860 Dehydration: Secondary | ICD-10-CM

## 2017-07-18 LAB — COMPREHENSIVE METABOLIC PANEL
ALT: 34 U/L (ref 0–35)
AST: 26 U/L (ref 0–37)
Albumin: 3.3 g/dL — ABNORMAL LOW (ref 3.5–5.2)
Alkaline Phosphatase: 180 U/L — ABNORMAL HIGH (ref 39–117)
BUN: 17 mg/dL (ref 6–23)
CO2: 27 mEq/L (ref 19–32)
Calcium: 9.2 mg/dL (ref 8.4–10.5)
Chloride: 104 mEq/L (ref 96–112)
Creatinine, Ser: 1.03 mg/dL (ref 0.40–1.20)
GFR: 56.46 mL/min — ABNORMAL LOW (ref >60.00)
Glucose, Bld: 83 mg/dL (ref 70–99)
Potassium: 5.1 mEq/L (ref 3.5–5.1)
Sodium: 139 mEq/L (ref 135–145)
Total Bilirubin: 0.7 mg/dL (ref 0.2–1.2)
Total Protein: 6.8 g/dL (ref 6.0–8.3)

## 2017-07-18 LAB — CBC WITH DIFFERENTIAL/PLATELET
Basophils Absolute: 0.1 10*3/uL (ref 0.0–0.1)
Basophils Relative: 0.9 % (ref 0.0–3.0)
Eosinophils Absolute: 0.4 10*3/uL (ref 0.0–0.7)
Eosinophils Relative: 3 % (ref 0.0–5.0)
HCT: 39 % (ref 36.0–46.0)
Hemoglobin: 12.8 g/dL (ref 12.0–15.0)
Lymphocytes Relative: 10.7 % — ABNORMAL LOW (ref 12.0–46.0)
Lymphs Abs: 1.5 10*3/uL (ref 0.7–4.0)
MCHC: 32.7 g/dL (ref 30.0–36.0)
MCV: 93.6 fl (ref 78.0–100.0)
Monocytes Absolute: 1.3 10*3/uL — ABNORMAL HIGH (ref 0.1–1.0)
Monocytes Relative: 8.8 % (ref 3.0–12.0)
Neutro Abs: 10.9 10*3/uL — ABNORMAL HIGH (ref 1.4–7.7)
Neutrophils Relative %: 76.6 % (ref 43.0–77.0)
Platelets: 665 10*3/uL — ABNORMAL HIGH (ref 150.0–400.0)
RBC: 4.17 Mil/uL (ref 3.87–5.11)
RDW: 13.9 % (ref 11.5–15.5)
WBC: 14.3 10*3/uL — ABNORMAL HIGH (ref 4.0–10.5)

## 2017-07-18 MED ORDER — ONDANSETRON 4 MG PO TBDP: 4.0000 mg | ORAL_TABLET | Freq: Three times a day (TID) | ORAL | 0 refills | Status: DC | PRN

## 2017-07-18 NOTE — Patient Instructions (Addendum)
Ok to use 1 immodium per loose stool  Avoid salads, broccoli, cauliflower (all cruciferous vegetables)   Ok to eat non greasy,  Non rough age type foods  Liver enzymes today    Recommended Low Carb high Protein premixed Shakes:   Premier Protein  (Best tasting) Atkins Advantage Muscle Milk EAS AdvantEdge   All of these are available at [employer], [place],  [place]  And taste good   Ok to use up to 2000 mg tylenol daily for pain

## 2017-07-18 NOTE — Progress Notes (Signed)
Subjective:  Patient ID: Kathy Long, female    DOB: January 21, 1951  Age: 67 y.o. MRN: [id number]  CC: The primary encounter diagnosis was Leakage of common bile duct. Diagnoses of Cholecystitis, acute, S/P laparoscopic cholecystectomy, Dehydration, Hospital discharge follow-up, and Acute right-sided thoracic back pain were also pertinent to this visit.  HPI [name] presents for hospital  follow up  Patient was admitted  To [facility] with hemorrhagic cholecystitis  On Feb 3,  Underwent  Lap chole and discharged home on   Feb 6 In  improved condition. She was  seen for post op by [facility] on Feb 8 with increased RUQ pain , orthopnea and chest tightness;   readmitted to [facility]  after stat CT suggested bile leak with fluid collection in Morrison's pouch. Received IV fluids,  Fortaz,   Underwent ERCP with stent placement  By Allen Norris on Feb 12 .  Discharged home on Feb 13. Labs done on Feb 13 noted AST 50 ALT 56 alk phos 314 T Bili 1.0  No change from    She is here today with husband.  Still has pain is on the right side  Rates it currently 4/10,  Feels like a tightness in a band  Around mid section.  No nausea . This morning  had a sharp painthat  localized to right upper posterior thorax brought on by lifting her  Right leg  To shave.  Aggravated by twisting , bending and coughing.  Using tylenol   Having loose stools but not watery,  After most meals.  Keeping diet simple, but admits to eating 1/2 of a hamburger the day she came home from hospital..  Chicken and broth/rice since then.   Outpatient Medications Prior to Visit  Medication Sig Dispense Refill  . amLODipine (NORVASC) 5 MG tablet Take 1 tablet (5 mg total) by mouth daily. MUST KEEP APPT IN January FOR FURTHER REFILLS 30 tablet 0  . b complex vitamins tablet Take 1 tablet by mouth daily.    . Cholecalciferol 1000 UNITS tablet Take 1,000 Units by mouth 2 (two) times daily.    . citalopram (CELEXA) 20 MG tablet TAKE ONE TABLET EVERY DAY 90  tablet 1  . Melatonin 3 MG CAPS Take 3 mg by mouth at bedtime.    . metoprolol succinate (TOPROL-XL) 50 MG 24 hr tablet TAKE ONE TABLET BY MOUTH EVERY DAY 90 tablet 1  . mirabegron ER (MYRBETRIQ) 25 MG TB24 tablet Take 1 tablet (25 mg total) by mouth daily. 30 tablet 5  . omeprazole (PRILOSEC) 40 MG capsule TAKE 1 CAPSULE TWICE DAILY NEEDS APPT FOR MORE REFILLS (Patient taking differently: TAKE 1 CAPSULE BY MOUTH EVERY MORNING) 180 capsule 1  . rosuvastatin (CRESTOR) 10 MG tablet TAKE ONE TABLET EVERY DAY 90 tablet 1  . vitamin E 400 UNIT capsule Take 400 Units by mouth daily.    Marland Kitchen HYDROcodone-acetaminophen (NORCO/VICODIN) 5-325 MG tablet Take 1-2 tablets by mouth every 4 (four) hours as needed for moderate pain. (Patient not taking: Reported on 07/08/2017) 30 tablet 0  . promethazine (PHENERGAN) 25 MG tablet Take 1 tablet (25 mg total) by mouth every 4 (four) hours as needed for nausea or vomiting. (Patient not taking: Reported on 07/08/2017) 10 tablet 0   No facility-administered medications prior to visit.     Review of Systems;  Patient denies headache, fevers, malaise, unintentional weight loss, skin rash, eye pain, sinus congestion and sinus pain, sore throat, dysphagia,  hemoptysis , cough, dyspnea,  wheezing, chest pain, palpitations, orthopnea, edema, abdominal pain, nausea, melena, diarrhea, constipation, flank pain, dysuria, hematuria, urinary  Frequency, nocturia, numbness, tingling, seizures,  Focal weakness, Loss of consciousness,  Tremor, insomnia, depression, anxiety, and suicidal ideation.      Objective:  BP 110/86 (BP Location: Left Arm, Patient Position: Sitting, Cuff Size: Normal)   Pulse 64   Temp (!) 97.5 F (36.4 C) (Oral)   Resp 15   Ht _0  (1.626 m)   Wt 162 lb (73.5 kg)   SpO2 95%   BMI 27.81 kg/m   BP Readings from Last 3 Encounters:  07/18/17 110/86  07/13/17 138/67  07/08/17 100/60    Wt Readings from Last 3 Encounters:  07/18/17 162 lb (73.5 kg)    07/13/17 173 lb 8 oz (78.7 kg)  07/08/17 167 lb (75.8 kg)    General appearance: alert, cooperative and appears stated age Ears: normal TM's and external ear canals both ears Throat: lips, mucosa, and tongue normal; teeth and gums normal Neck: no adenopathy, no carotid bruit, supple, symmetrical, trachea midline and thyroid not enlarged, symmetric, no tenderness/mass/nodules Back: symmetric, no curvature. ROM normal. No CVA tenderness. Lungs: clear to auscultation bilaterally Heart: regular rate and rhythm, S1, S2 normal, no murmur, click, rub or gallop Abdomen: soft, non-tender; bowel sounds normal; no masses,  no organomegaly Pulses: 2+ and symmetric Skin: Skin color, texture, turgor normal. No rashes or lesions Lymph nodes: Cervical, supraclavicular, and axillary nodes normal.  Lab Results  Component Value Date   HGBA1C 5.8 06/10/2017   HGBA1C 5.7 06/02/2016    Lab Results  Component Value Date   CREATININE 1.03 07/18/2017   CREATININE 0.70 07/13/2017   CREATININE 1.00 07/12/2017    Lab Results  Component Value Date   WBC 14.3 (H) 07/18/2017   HGB 12.8 07/18/2017   HCT 39.0 07/18/2017   PLT 665.0 (H) 07/18/2017   GLUCOSE 83 07/18/2017   CHOL 154 06/10/2017   TRIG 79.0 06/10/2017   HDL 49.00 06/10/2017   LDLCALC 89 06/10/2017   ALT 34 07/18/2017   AST 26 07/18/2017   NA 139 07/18/2017   K 5.1 07/18/2017   CL 104 07/18/2017   CREATININE 1.03 07/18/2017   BUN 17 07/18/2017   CO2 27 07/18/2017   TSH 1.50 06/10/2017   INR 0.91 10/23/2009   HGBA1C 5.8 06/10/2017    Nm Hepatobiliary Liver Func  Result Date: 07/09/2017 CLINICAL DATA:  67 year old female status post cholecystectomy 1 week ago. Continued right upper quadrant abdominal pain and with perihepatic fluid identified on recent CT. EXAM: NUCLEAR MEDICINE HEPATOBILIARY IMAGING TECHNIQUE: Sequential images of the abdomen were obtained out to 60 minutes following intravenous administration of  radiopharmaceutical. RADIOPHARMACEUTICALS:  5.375 mCi Tc-49m Choletec IV COMPARISON:  07/08/2017 CT FINDINGS: Prompt hepatic uptake is identified. At the end of the first hour, a small amount of activity is noted in the region of the expected cholecystectomy bed which increases and extends along the right and superior liver on further delayed images, compatible with a bile leak. IMPRESSION: Findings compatible with bile leak. Electronically Signed   By: JMargarette CanadaM.D.   On: 07/09/2017 11:36   Ct Abdomen Pelvis W Contrast  Result Date: 07/08/2017 CLINICAL DATA:  RIGHT upper quadrant pain cholecystectomy 1 week prior. Nausea. EXAM: CT ABDOMEN AND PELVIS WITH CONTRAST TECHNIQUE: Multidetector CT imaging of the abdomen and pelvis was performed using the standard protocol following bolus administration of intravenous contrast. CONTRAST:  783mISOVUE-300 IOPAMIDOL (ISOVUE-300) INJECTION  61% COMPARISON:  Intraoperative cholangiogram. Ultrasound 07/03/2017, CT 10/02/2014 FINDINGS: Lower chest: Mild bibasilar atelectasis. Small low-density RIGHT effusion Hepatobiliary: Postcholecystectomy. Small amount of unorganized fluid in the gallbladder fossa. There is a collection of fluid along the medial margin of the RIGHT hepatic lobe measuring 6.0 by 4.1 cm (image 29, series 2). This is low-density. Additionally there is fluid of beneath the hemidiaphragm over the barespace of the liver measuring 12 mm in depth (image 12, series 2). Small amount of fluid collects within the pelvis which is also low-attenuation. Postcholecystectomy. Cholecystectomy clips in the gallbladder fossa. There is no intrahepatic duct dilatation. Pancreas: The common bile duct is normal caliber. The pancreas is normal. Spleen: Normal spleen Adrenals/urinary tract: Adrenal glands and kidneys are normal. The ureters and bladder normal. Stomach/Bowel: Stomach, small-bowel cecum normal. Post appendectomy. Small amount inflammation along the hepatic  flexure related to recent surgery. No acute bowel inflammation. 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 Rectum normal. Vascular/Lymphatic: Abdominal aorta is normal caliber. There is no retroperitoneal or periportal lymphadenopathy. No pelvic lymphadenopathy. Reproductive: Uterus and ovaries normal Other: No intraperitoneal free air. Intraperitoneal free fluid as described hepatic section Musculoskeletal: No aggressive osseous lesion IMPRESSION: 1. Fluid within Morrison's pouch and over the bare space of the liver is concerning for bile leak. Overall fluid volume is mild-to-moderate. Consider nuclear medicine HIDA scan if clinically equivocal for bile leak. 2. No biliary obstruction. 3. No evidence pancreatitis. 4. Small amount free fluid the pelvis related to the presumed bile leak described above. 5. Small RIGHT effusion and basilar atelectasis. Electronically Signed   By: Suzy Bouchard M.D.   On: 07/08/2017 15:23    Assessment & Plan:   Problem List Items Addressed This Visit    Cholecystitis, acute    S/p lap chole,  Hemorrhagic cholecystitis found. Post op complicated by bile leak and readmission for Biliary stent placemement done Feb 12 by Allen Norris.    Lab Results  Component Value Date   ALT 34 07/18/2017   AST 26 07/18/2017   ALKPHOS 180 (H) 07/18/2017   BILITOT 0.7 07/18/2017         Relevant Orders   CBC with Differential/Platelet (Completed)   Dehydration    Resolved.  Lytes are normalized.   Lab Results  Component Value Date   NA 139 07/18/2017   K 5.1 07/18/2017   CL 104 07/18/2017   CO2 27 07/18/2017   Lab Results  Component Value Date   CREATININE 1.03 07/18/2017         Leakage of common bile duct - Primary    S/p biliary stent via ERCP on Feb 12.  Liver enzymes much better than at discharge.   Lab Results  Component Value Date   ALT 34 07/18/2017   AST 26 07/18/2017   ALKPHOS 180 (H) 07/18/2017   BILITOT 0.7 07/18/2017          Relevant Orders   Comprehensive metabolic panel (Completed)   S/P laparoscopic cholecystectomy    Nausea and pain improved,  Still having diarrhea.       Hospital discharge follow-up    Patient is stable post discharge and has no new issues or questions about her discharge plans.  Surgical follow up with [facility] anticipated for Feb 19       Back pain    Consideration of PE in the setting of recent surgery. However, the pain was transient and brought on by stretching her psoas muscle when she flexed her hip, and  aggravated by movement.  She is not tachycardic or short of breath, and she has ho calf tenderness.          I have discontinued Kathy Long "Debbie"'s HYDROcodone-acetaminophen and promethazine. I am also having her start on ondansetron. Additionally, I am having her maintain her Cholecalciferol, Melatonin, amLODipine, omeprazole, mirabegron ER, rosuvastatin, citalopram, metoprolol succinate, vitamin E, and b complex vitamins.  Meds ordered this encounter  Medications  . ondansetron (ZOFRAN ODT) 4 MG disintegrating tablet    Sig: Take 1 tablet (4 mg total) by mouth every 8 (eight) hours as needed for nausea or vomiting.    Dispense:  20 tablet    Refill:  0    Medications Discontinued During This Encounter  Medication Reason  . HYDROcodone-acetaminophen (NORCO/VICODIN) 1-829 MG tablet Duplicate  . promethazine (PHENERGAN) 25 MG tablet Duplicate    Follow-up: No Follow-up on file.   [name], MD

## 2017-07-19 ENCOUNTER — Ambulatory Visit (INDEPENDENT_AMBULATORY_CARE_PROVIDER_SITE_OTHER): Payer: Medicare Other | Admitting: General Surgery

## 2017-07-19 ENCOUNTER — Encounter: Payer: Self-pay | Admitting: General Surgery

## 2017-07-19 VITALS — BP 104/62 | HR 68 | Resp 12 | Ht 64.0 in | Wt 162.0 lb

## 2017-07-19 DIAGNOSIS — Z09 Encounter for follow-up examination after completed treatment for conditions other than malignant neoplasm: Secondary | ICD-10-CM | POA: Insufficient documentation

## 2017-07-19 DIAGNOSIS — K810 Acute cholecystitis: Secondary | ICD-10-CM

## 2017-07-19 DIAGNOSIS — Z9049 Acquired absence of other specified parts of digestive tract: Secondary | ICD-10-CM | POA: Insufficient documentation

## 2017-07-19 DIAGNOSIS — K838 Other specified diseases of biliary tract: Secondary | ICD-10-CM

## 2017-07-19 DIAGNOSIS — M549 Dorsalgia, unspecified: Secondary | ICD-10-CM | POA: Insufficient documentation

## 2017-07-19 DIAGNOSIS — K9189 Other postprocedural complications and disorders of digestive system: Secondary | ICD-10-CM

## 2017-07-19 HISTORY — DX: Acquired absence of other specified parts of digestive tract: Z90.49

## 2017-07-19 NOTE — Assessment & Plan Note (Signed)
S/p biliary stent via ERCP on Feb 12.  Liver enzymes much better than at discharge.   Lab Results  Component Value Date   ALT 34 07/18/2017   AST 26 07/18/2017   ALKPHOS 180 (H) 07/18/2017   BILITOT 0.7 07/18/2017

## 2017-07-19 NOTE — Assessment & Plan Note (Addendum)
Patient is stable post discharge and has no new issues or questions about her discharge plans.  Surgical follow up with [facility] anticipated for Feb 19

## 2017-07-19 NOTE — Patient Instructions (Addendum)
The patient is aware to call back for any questions or new concerns.  

## 2017-07-19 NOTE — Assessment & Plan Note (Signed)
Resolved.  Lytes are normalized.   Lab Results  Component Value Date   NA 139 07/18/2017   K 5.1 07/18/2017   CL 104 07/18/2017   CO2 27 07/18/2017   Lab Results  Component Value Date   CREATININE 1.03 07/18/2017

## 2017-07-19 NOTE — Assessment & Plan Note (Signed)
S/p lap chole,  Hemorrhagic cholecystitis found. Post op complicated by bile leak and readmission for Biliary stent placemement done Feb 12 by Allen Norris.    Lab Results  Component Value Date   ALT 34 07/18/2017   AST 26 07/18/2017   ALKPHOS 180 (H) 07/18/2017   BILITOT 0.7 07/18/2017

## 2017-07-19 NOTE — Progress Notes (Signed)
Patient ID: Kathy Long, female   DOB: 08/10/1950, 67 y.o.   MRN: [id number]  Chief Complaint  Patient presents with  . Routine Post Op    HPI Kathy Long is a 67 y.o. female.  Here today for postoperative visit, gallbladder removal done on 07/05/2017, she states she is doing better, still weak. ERCP was 07-12-17 by Dr Allen Norris. Denies any gastrointestinal issues, belching after meals, bowels are moving regular since Sunday.  HPI  Past Medical History:  Diagnosis Date  . Acoustic neuroma (HCC)    left ear  . Cancer (HCC)    melanoma, shoulder  . Depression   . Hyperlipidemia   . Hypertension     Past Surgical History:  Procedure Laterality Date  . APPENDECTOMY  2010  . BACK SURGERY     disectomy lumbar, scar tissue  . CHOLECYSTECTOMY N/A 07/05/2017   Procedure: LAPAROSCOPIC CHOLECYSTECTOMY WITH INTRAOPERATIVE CHOLANGIOGRAM;  Surgeon: Liberato Stansbery W, MD;  Location: [facility] ORS;  Service: General;  Laterality: N/A;  . COLONOSCOPY     20 09 and color gaurd in 2018  . ERCP N/A 07/12/2017   Procedure: ENDOSCOPIC RETROGRADE CHOLANGIOPANCREATOGRAPHY (ERCP);  Surgeon: Lucilla Lame, MD;  Location: [facility] ENDOSCOPY;  Service: Endoscopy;  Laterality: N/A;  . FOOT SURGERY     x 2  . MELANOMA EXCISION Left 2000  . PALATOPLASTY N/A 04/08/2015   Procedure: PALATOPLASTY;  Surgeon: Beverly Gust, MD;  Location: [facility] ORS;  Service: ENT;  Laterality: N/A;  . TONSILLECTOMY N/A 04/08/2015   Procedure: TONSILLECTOMY;  Surgeon: Beverly Gust, MD;  Location: [facility] ORS;  Service: ENT;  Laterality: N/A;  . TUBAL LIGATION      Family History  Problem Relation Age of Onset  . Cancer Sister 67       ovarian ca    Social History Social History   Tobacco Use  . Smoking status: Never Smoker  . Smokeless tobacco: Never Used  Substance Use Topics  . Alcohol use: Yes    Alcohol/week: 0.0 oz    Comment: occ  . Drug use: No    Allergies  Allergen Reactions  . Morphine And Related Other (See  Comments)    Chest pain  . Tramadol Other (See Comments)  . Tape Rash    Other reaction(s): UNKNOWN    Current Outpatient Medications  Medication Sig Dispense Refill  . amLODipine (NORVASC) 5 MG tablet Take 1 tablet (5 mg total) by mouth daily. MUST KEEP APPT IN January FOR FURTHER REFILLS 30 tablet 0  . b complex vitamins tablet Take 1 tablet by mouth daily.    . Cholecalciferol 1000 UNITS tablet Take 1,000 Units by mouth 2 (two) times daily.    . citalopram (CELEXA) 20 MG tablet TAKE ONE TABLET EVERY DAY 90 tablet 1  . Melatonin 3 MG CAPS Take 3 mg by mouth at bedtime.    . metoprolol succinate (TOPROL-XL) 50 MG 24 hr tablet TAKE ONE TABLET BY MOUTH EVERY DAY 90 tablet 1  . mirabegron ER (MYRBETRIQ) 25 MG TB24 tablet Take 1 tablet (25 mg total) by mouth daily. 30 tablet 5  . omeprazole (PRILOSEC) 40 MG capsule TAKE 1 CAPSULE TWICE DAILY NEEDS APPT FOR MORE REFILLS (Patient taking differently: TAKE 1 CAPSULE BY MOUTH EVERY MORNING) 180 capsule 1  . rosuvastatin (CRESTOR) 10 MG tablet TAKE ONE TABLET EVERY DAY 90 tablet 1  . vitamin E 400 UNIT capsule Take 400 Units by mouth daily.     No current facility-administered medications for  this visit.     Review of Systems Review of Systems  Constitutional: Negative.   Respiratory: Negative.   Cardiovascular: Negative.   Gastrointestinal: Negative for constipation and diarrhea.    Blood pressure 104/62, pulse 68, resp. rate 12, height 5\' 4"  (1.626 m), weight 162 lb (73.5 kg).  Physical Exam Physical Exam  Constitutional: She is oriented to person, place, and time. She appears well-developed and well-nourished.  HENT:  Mouth/Throat: Oropharynx is clear and moist.  Eyes: Conjunctivae are normal. No scleral icterus.  Neck: Neck supple.  Cardiovascular: Normal rate, regular rhythm and normal heart sounds.  Pulmonary/Chest: Effort normal and breath sounds normal.  Abdominal: Soft. Normal appearance and bowel sounds are normal. There is  no tenderness.  Lymphadenopathy:    She has no cervical adenopathy.  Neurological: She is alert and oriented to person, place, and time.  Skin: Skin is warm and dry.  Psychiatric: Her behavior is normal.    Data Reviewed CBC completed yesterday again shows a modest leukocytosis with a white blood cell count of 14,300, 77% polys, 11% lymphocytes.  Stable hemoglobin at 12.8.  Elevated platelet count of 665,000. Comprehensive metabolic panel of the same date shows a modest elevation of the serum alkaline phosphatase at 180.  Normal transaminases.  Normal bilirubin at 0.7.  Creatinine 1.03 with estimated GFR of 56.  Baseline. ERCP of July 12, 2017 reviewed.  No description of ongoing bile leak during imaging.  Stent placed.   Assessment    Good progress post hospitalization for a very small modest bile leak at the site of the cystic duct.  Treated with ERCP and stent placement.    Plan    The patient is making a slow steady but slow recovery.  She reports some early satiety.  Her color is a little pale, but no evidence of jaundice.  We will keep a close eye on her, and arrange for a follow-up examination in 1 week.     The patient was encouraged to call if she has any recurrent, significant abdominal pain or develops any fever or chills.     HPI, Physical Exam, Assessment and Plan have been scribed under the direction and in the presence of Robert Bellow, MD. Karie Fetch, RN  I have completed the exam and reviewed the above documentation for accuracy and completeness.  I agree with the above.  Haematologist has been used and any errors in dictation or transcription are unintentional.  Hervey Ard, M.D., F.A.C.S.   Forest Gleason Dannis Deroche 07/19/2017, 9:07 PM

## 2017-07-19 NOTE — Assessment & Plan Note (Signed)
Consideration of PE in the setting of recent surgery. However, the pain was transient and brought on by stretching her psoas muscle when she flexed her hip, and aggravated by movement.  She is not tachycardic or short of breath, and she has ho calf tenderness.

## 2017-07-19 NOTE — Assessment & Plan Note (Signed)
Nausea and pain improved,  Still having diarrhea.

## 2017-07-26 ENCOUNTER — Encounter: Payer: Self-pay | Admitting: General Surgery

## 2017-07-26 ENCOUNTER — Ambulatory Visit (INDEPENDENT_AMBULATORY_CARE_PROVIDER_SITE_OTHER): Payer: Medicare Other | Admitting: General Surgery

## 2017-07-26 VITALS — BP 120/74 | HR 72 | Resp 12 | Ht 64.0 in | Wt 161.0 lb

## 2017-07-26 DIAGNOSIS — K810 Acute cholecystitis: Secondary | ICD-10-CM

## 2017-07-26 NOTE — Progress Notes (Signed)
Patient ID: Kathy Long, female   DOB: 1951/01/26, 67 y.o.   MRN: [id number]  Chief Complaint  Patient presents with  . Routine Post Op    HPI Kathy Long is a 67 y.o. female here today for her post op cholecystectomy done on 07/05/2017. Patient states she is doing well. Patient states she had diarrhea and than today normal bowel movement.  HPI  Past Medical History:  Diagnosis Date  . Acoustic neuroma (HCC)    left ear  . Cancer (HCC)    melanoma, shoulder  . Depression   . Hyperlipidemia   . Hypertension     Past Surgical History:  Procedure Laterality Date  . APPENDECTOMY  2010  . BACK SURGERY     disectomy lumbar, scar tissue  . CHOLECYSTECTOMY N/A 07/05/2017   Procedure: LAPAROSCOPIC CHOLECYSTECTOMY WITH INTRAOPERATIVE CHOLANGIOGRAM;  Surgeon: Robert Bellow, MD;  Location: [facility] ORS;  Service: General;  Laterality: N/A;  . COLONOSCOPY     2009 and color gaurd in 2018  . ERCP N/A 07/12/2017   Procedure: ENDOSCOPIC RETROGRADE CHOLANGIOPANCREATOGRAPHY (ERCP);  Surgeon: Lucilla Lame, MD;  Location: [facility] ENDOSCOPY;  Service: Endoscopy;  Laterality: N/A;  . FOOT SURGERY     x 2  . MELANOMA EXCISION Left 2000  . PALATOPLASTY N/A 04/08/2015   Procedure: PALATOPLASTY;  Surgeon: Beverly Gust, MD;  Location: [facility] ORS;  Service: ENT;  Laterality: N/A;  . TONSILLECTOMY N/A 04/08/2015   Procedure: TONSILLECTOMY;  Surgeon: Beverly Gust, MD;  Location: [facility] ORS;  Service: ENT;  Laterality: N/A;  . TUBAL LIGATION      Family History  Problem Relation Age of Onset  . Cancer Sister 24       ovarian ca    Social History Social History   Tobacco Use  . Smoking status: Never Smoker  . Smokeless tobacco: Never Used  Substance Use Topics  . Alcohol use: Yes    Alcohol/week: 0.0 oz    Comment: occ  . Drug use: No    Allergies  Allergen Reactions  . Morphine And Related Other (See Comments)    Chest pain  . Tramadol Other (See Comments)  . Tape Rash    Other  reaction(s): UNKNOWN    Current Outpatient Medications  Medication Sig Dispense Refill  . amLODipine (NORVASC) 5 MG tablet Take 1 tablet (5 mg total) by mouth daily. MUST KEEP APPT IN January FOR FURTHER REFILLS 30 tablet 0  . b complex vitamins tablet Take 1 tablet by mouth daily.    . Cholecalciferol 1000 UNITS tablet Take 1,000 Units by mouth 2 (two) times daily.    . citalopram (CELEXA) 20 MG tablet TAKE ONE TABLET EVERY DAY 90 tablet 1  . Melatonin 3 MG CAPS Take 3 mg by mouth at bedtime.    . metoprolol succinate (TOPROL-XL) 50 MG 24 hr tablet TAKE ONE TABLET BY MOUTH EVERY DAY 90 tablet 1  . mirabegron ER (MYRBETRIQ) 25 MG TB24 tablet Take 1 tablet (25 mg total) by mouth daily. 30 tablet 5  . omeprazole (PRILOSEC) 40 MG capsule TAKE 1 CAPSULE TWICE DAILY NEEDS APPT FOR MORE REFILLS (Patient taking differently: TAKE 1 CAPSULE BY MOUTH EVERY MORNING) 180 capsule 1  . rosuvastatin (CRESTOR) 10 MG tablet TAKE ONE TABLET EVERY DAY 90 tablet 1  . vitamin E 400 UNIT capsule Take 400 Units by mouth daily.     No current facility-administered medications for this visit.     Review of Systems Review of  Systems  Constitutional: Negative.   Respiratory: Negative.   Cardiovascular: Negative.     Blood pressure 120/74, pulse 72, resp. rate 12, height 5\' 4"  (1.626 m), weight 161 lb (73 kg).  Physical Exam Physical Exam  Constitutional: She is oriented to person, place, and time. She appears well-developed and well-nourished.  Cardiovascular: Normal rate, regular rhythm and normal heart sounds.  Pulmonary/Chest: Effort normal and breath sounds normal.  Abdominal:  Port sites are clean and healing well.   Neurological: She is alert and oriented to person, place, and time.  Skin: Skin is warm and dry.    Data Reviewed No new laboratory studies.  Assessment    Steady improvement in energy, appetite and sense of well-being since last week.    Plan Patient to return as needed.  Patient to call and let us know when she is ready for her hernia repair. The patient is aware to call back for any questions or concerns.   HPI, Physical Exam, Assessment and Plan have been scribed under the direction and in the presence of Hervey Ard, MD.  Gaspar Cola, CMA  I have completed the exam and reviewed the above documentation for accuracy and completeness.  I agree with the above.  Haematologist has been used and any errors in dictation or transcription are unintentional.  Hervey Ard, M.D., F.A.C.S.    Kathy Long 07/28/2017, 8:56 AM

## 2017-07-26 NOTE — Patient Instructions (Signed)
Patient to return as needed. Patient to call and let us know when she is ready for her hernia repair. The patient is aware to call back for any questions or concerns.

## 2017-07-27 ENCOUNTER — Encounter: Payer: Self-pay | Admitting: Internal Medicine

## 2017-07-29 ENCOUNTER — Ambulatory Visit
Admission: RE | Admit: 2017-07-29 | Discharge: 2017-07-29 | Disposition: A | Discharge status: Home / Self Care | Payer: Medicare Other | Source: Ambulatory Visit | Attending: Internal Medicine | Admitting: Internal Medicine

## 2017-07-29 DIAGNOSIS — Z1231 Encounter for screening mammogram for malignant neoplasm of breast: Secondary | ICD-10-CM | POA: Diagnosis not present

## 2017-07-29 DIAGNOSIS — Z1239 Encounter for other screening for malignant neoplasm of breast: Secondary | ICD-10-CM

## 2017-08-01 NOTE — Telephone Encounter (Signed)
Patient scheduled for 08/03/17 at 3 PM fyi.

## 2017-08-03 ENCOUNTER — Encounter: Payer: Self-pay | Admitting: Internal Medicine

## 2017-08-03 ENCOUNTER — Ambulatory Visit: Payer: Medicare Other | Admitting: Internal Medicine

## 2017-08-03 VITALS — BP 104/62 | HR 57 | Temp 97.7°F | Resp 15 | Ht 64.0 in | Wt 160.4 lb

## 2017-08-03 DIAGNOSIS — R2231 Localized swelling, mass and lump, right upper limb: Secondary | ICD-10-CM

## 2017-08-03 DIAGNOSIS — K915 Postcholecystectomy syndrome: Secondary | ICD-10-CM | POA: Diagnosis not present

## 2017-08-03 MED ORDER — DICYCLOMINE HCL 10 MG PO CAPS: 10.0000 mg | ORAL_CAPSULE | Freq: Three times a day (TID) | ORAL | 0 refills | Status: DC

## 2017-08-03 NOTE — Progress Notes (Signed)
Subjective:  Patient ID: Kathy Long, female    DOB: 02-Oct-1950  Age: 67 y.o. MRN: [id number]  CC: The primary encounter diagnosis was Axillary mass, right. A diagnosis of Postcholecystectomy syndrome was also pertinent to this visit.  HPI Kathy Long presents for  Evaluation of ultiple issues  1) persistent cramping lower abdominal pain that occurs several hours after eating solid food .  She is s/p  Lap cholecystectomy on Feb 5 complicated by a bile leak confirmed by HIDA scan requiring readmission on February 8  For biliary stent placement via ERCP.  She was discharged home on Feb 13 with resolution of epigastric pain and leukocytosis.    She has had an unintentional but welcomed  weight loss of 16 lbs since  January,  Including 2 lbs since Last visit with me Feb 19 . Reports  develops belching almost immediately after eating.   Stools have been soft to formed   For the last week.  Afraid to eat because of the pain that occurs if she eats anything but chicken and rice. Denies nausea and RUQ pain . Found out that her insurance will pay for 3 nutritionists visits  2) exquisitely tender area under right axilla in area of prior cyst excision nearly 50 years ago.   Feels a Bb sized mass deep in the axilla.  The pain radiates down arm when arm is adducted to side but not with other motions   Outpatient Medications Prior to Visit  Medication Sig Dispense Refill  . amLODipine (NORVASC) 5 MG tablet Take 1 tablet (5 mg total) by mouth daily. MUST KEEP APPT IN January FOR FURTHER REFILLS 30 tablet 0  . b complex vitamins tablet Take 1 tablet by mouth daily.    . Cholecalciferol 1000 UNITS tablet Take 1,000 Units by mouth 2 (two) times daily.    . citalopram (CELEXA) 20 MG tablet TAKE ONE TABLET EVERY DAY 90 tablet 1  . Melatonin 3 MG CAPS Take 3 mg by mouth at bedtime.    . metoprolol succinate (TOPROL-XL) 50 MG 24 hr tablet TAKE ONE TABLET BY MOUTH EVERY DAY 90 tablet 1  . mirabegron ER  (MYRBETRIQ) 25 MG TB24 tablet Take 1 tablet (25 mg total) by mouth daily. 30 tablet 5  . omeprazole (PRILOSEC) 40 MG capsule TAKE 1 CAPSULE TWICE DAILY NEEDS APPT FOR MORE REFILLS (Patient taking differently: TAKE 1 CAPSULE BY MOUTH EVERY MORNING) 180 capsule 1  . rosuvastatin (CRESTOR) 10 MG tablet TAKE ONE TABLET EVERY DAY 90 tablet 1  . vitamin E 400 UNIT capsule Take 400 Units by mouth daily.     No facility-administered medications prior to visit.     Review of Systems;  Patient denies headache, fevers, malaise,  skin rash, eye pain, sinus congestion and sinus pain, sore throat, dysphagia,  hemoptysis , cough, dyspnea, wheezing, chest pain, palpitations, orthopnea, edema, , nausea, melena, diarrhea, constipation, flank pain, dysuria, hematuria, urinary  Frequency, nocturia, numbness, tingling, seizures,  Focal weakness, Loss of consciousness,  Tremor, insomnia, depression, anxiety, and suicidal ideation.      Objective:  BP 104/62 (BP Location: Left Arm, Patient Position: Sitting, Cuff Size: Normal)   Pulse (!) 57   Temp 97.7 F (36.5 C) (Oral)   Resp 15   Ht 5\' 4"  (1.626 m)   Wt 160 lb 6.4 oz (72.8 kg)   SpO2 95%   BMI 27.53 kg/m   BP Readings from Last 3 Encounters:  08/03/17 104/62  07/26/17  120/74  07/19/17 104/62    Wt Readings from Last 3 Encounters:  08/03/17 160 lb 6.4 oz (72.8 kg)  07/26/17 161 lb (73 kg)  07/19/17 162 lb (73.5 kg)    General appearance: alert, cooperative and appears stated age Neck: no adenopathy, no carotid bruit, supple, symmetrical, trachea midline and thyroid not R axilla:  Bb sized  Tender nodule deep in axilla,  No skin changes.  Back: symmetric, no curvature. ROM normal. No CVA tenderness. Lungs: clear to auscultation bilaterally Heart: regular rate and rhythm, S1, S2 normal, no murmur, click, rub or gallop Abdomen: soft, tender over surgical site.  bowel sounds normal; no masses,  no organomegaly Pulses: 2+ and symmetric Skin:  Skin color, texture, turgor normal. No rashes or lesions Lymph nodes: Cervical, supraclavicular, and axillary nodes normal.  Lab Results  Component Value Date   HGBA1C 5.8 06/10/2017   HGBA1C 5.7 06/02/2016    Lab Results  Component Value Date   CREATININE 1.03 07/18/2017   CREATININE 0.70 07/13/2017   CREATININE 1.00 07/12/2017    Lab Results  Component Value Date   WBC 14.3 (H) 07/18/2017   HGB 12.8 07/18/2017   HCT 39.0 07/18/2017   PLT 665.0 (H) 07/18/2017   GLUCOSE 83 07/18/2017   CHOL 154 06/10/2017   TRIG 79.0 06/10/2017   HDL 49.00 06/10/2017   LDLCALC 89 06/10/2017   ALT 34 07/18/2017   AST 26 07/18/2017   NA 139 07/18/2017   K 5.1 07/18/2017   CL 104 07/18/2017   CREATININE 1.03 07/18/2017   BUN 17 07/18/2017   CO2 27 07/18/2017   TSH 1.50 06/10/2017   INR 0.91 10/23/2009   HGBA1C 5.8 06/10/2017    Mm Screening Breast Tomo Bilateral  Result Date: 07/29/2017 CLINICAL DATA:  Screening. EXAM: DIGITAL SCREENING BILATERAL MAMMOGRAM WITH TOMO AND CAD COMPARISON:  Previous exam(s). ACR Breast Density Category c: The breast tissue is heterogeneously dense, which may obscure small masses. FINDINGS: There are no findings suspicious for malignancy. Images were processed with CAD. IMPRESSION: No mammographic evidence of malignancy. A result letter of this screening mammogram will be mailed directly to the patient. RECOMMENDATION: Screening mammogram in one year. (Code:SM-B-01Y) BI-RADS CATEGORY  1: Negative. Electronically Signed   By: Nolon Nations M.D.   On: 07/29/2017 16:49    Assessment & Plan:   Problem List Items Addressed This Visit    Axillary mass, right - Primary    Exam and history suggestive of recurrent cyst with pain radiating to arm due to pressure on the axillary nerve .  diagnositc mammogram and ultrasound of axilla ordered       Relevant Orders   US BREAST LTD UNI RIGHT INC AXILLA   Postcholecystectomy syndrome    Suspected by current symptoms  suggestive of IBS  .occurring 3 hours after eating anything but chicken and rice.   She does have diverticulosis but denies fevers and is non tender on exam.  Symptoms occur after eating and are limited to severe lower abdominal cramping.  Will initiate brief trial of dicyclomine before meals.  If no improvement she will need CT abd and pelvis.        A total of 25 minutes was spent with patient more than half of which was spent in counseling patient on the above mentioned issues , reviewing and explaining recent labs and imaging studies done, and coordination of care.  I am having Kathy Long "Debbie" start on dicyclomine. I am also having her  maintain her Cholecalciferol, Melatonin, amLODipine, omeprazole, mirabegron ER, rosuvastatin, citalopram, metoprolol succinate, vitamin E, and b complex vitamins.  Meds ordered this encounter  Medications  . dicyclomine (BENTYL) 10 MG capsule    Sig: Take 1 capsule (10 mg total) by mouth 4 (four) times daily -  before meals and at bedtime.    Dispense:  120 capsule    Refill:  0    There are no discontinued medications.  Follow-up: No Follow-up on file.   [name], MD

## 2017-08-03 NOTE — Patient Instructions (Signed)
I have ordered the ultrasound of your right axilla   .  I would reschedule your appointment with Dr Kellie Moor as well.    I  Want you to try the dicyclomine first.  It is taken 30 minutes prior to eating , to prevent abdominal cramping.  You can take it up to 4 times daily

## 2017-08-04 ENCOUNTER — Telehealth: Payer: Self-pay | Admitting: Internal Medicine

## 2017-08-04 DIAGNOSIS — Z9049 Acquired absence of other specified parts of digestive tract: Secondary | ICD-10-CM

## 2017-08-04 DIAGNOSIS — R1011 Right upper quadrant pain: Secondary | ICD-10-CM

## 2017-08-04 DIAGNOSIS — K9189 Other postprocedural complications and disorders of digestive system: Secondary | ICD-10-CM

## 2017-08-04 DIAGNOSIS — R2231 Localized swelling, mass and lump, right upper limb: Secondary | ICD-10-CM

## 2017-08-04 DIAGNOSIS — K838 Other specified diseases of biliary tract: Secondary | ICD-10-CM

## 2017-08-04 NOTE — Telephone Encounter (Signed)
Pt has to have a Diag Uni right [place]. Please and Thank you!

## 2017-08-04 NOTE — Telephone Encounter (Signed)
That is NOT  The number that comes up when you type "  Diag Uni right"  You get  IMG [id number].      So which is it that I have to order?    This is soooo frustrating.  I have ordered both because I am tired of wasting time  Trying to guess what [facility] wants

## 2017-08-06 DIAGNOSIS — K915 Postcholecystectomy syndrome: Secondary | ICD-10-CM | POA: Insufficient documentation

## 2017-08-06 DIAGNOSIS — R2231 Localized swelling, mass and lump, right upper limb: Secondary | ICD-10-CM | POA: Insufficient documentation

## 2017-08-06 HISTORY — DX: Postcholecystectomy syndrome: K91.5

## 2017-08-06 NOTE — Assessment & Plan Note (Signed)
Exam and history suggestive of recurrent cyst with pain radiating to arm due to pressure on the axillary nerve .  diagnositc mammogram and ultrasound of axilla ordered

## 2017-08-06 NOTE — Assessment & Plan Note (Addendum)
Suspected by current symptoms suggestive of IBS  .occurring 3 hours after eating anything but chicken and rice.   She does have diverticulosis but denies fevers and is non tender on exam.  Symptoms occur after eating and are limited to severe lower abdominal cramping.  Will initiate brief trial of dicyclomine before meals.  If no improvement she will need CT abd and pelvis.

## 2017-08-10 NOTE — Telephone Encounter (Addendum)
Patient is requesting to add a liver US? due to pain she is still having, spoke to provider about it at last appt. Call back [phone]  Also wanted to let provider know that dicyclomine is working great.

## 2017-08-11 ENCOUNTER — Ambulatory Visit
Admission: RE | Admit: 2017-08-11 | Discharge: 2017-08-11 | Disposition: A | Discharge status: Home / Self Care | Payer: Medicare Other | Source: Ambulatory Visit | Attending: Internal Medicine | Admitting: Internal Medicine

## 2017-08-11 DIAGNOSIS — R2231 Localized swelling, mass and lump, right upper limb: Secondary | ICD-10-CM

## 2017-08-11 DIAGNOSIS — N644 Mastodynia: Secondary | ICD-10-CM | POA: Diagnosis not present

## 2017-08-11 DIAGNOSIS — N6310 Unspecified lump in the right breast, unspecified quadrant: Secondary | ICD-10-CM | POA: Diagnosis not present

## 2017-08-11 DIAGNOSIS — N6001 Solitary cyst of right breast: Secondary | ICD-10-CM | POA: Insufficient documentation

## 2017-08-11 NOTE — Addendum Note (Signed)
Addended by: [name] on: 08/11/2017 05:29 PM   Modules accepted: Orders

## 2017-08-11 NOTE — Telephone Encounter (Signed)
If she is having right upper quadrant pain,  Then yes I will order an ultrasound to check position of the biliary stent

## 2017-08-12 NOTE — Telephone Encounter (Signed)
Patient notified per DPR detailed message left on home phone.Kathy Long

## 2017-08-16 ENCOUNTER — Other Ambulatory Visit: Payer: Self-pay | Admitting: Internal Medicine

## 2017-08-16 ENCOUNTER — Ambulatory Visit
Admission: RE | Admit: 2017-08-16 | Discharge: 2017-08-16 | Disposition: A | Discharge status: Home / Self Care | Payer: Medicare Other | Source: Ambulatory Visit | Attending: Internal Medicine | Admitting: Internal Medicine

## 2017-08-16 ENCOUNTER — Encounter: Payer: Self-pay | Admitting: Internal Medicine

## 2017-08-16 DIAGNOSIS — Z9049 Acquired absence of other specified parts of digestive tract: Secondary | ICD-10-CM | POA: Diagnosis not present

## 2017-08-16 DIAGNOSIS — R1011 Right upper quadrant pain: Secondary | ICD-10-CM | POA: Diagnosis present

## 2017-08-16 DIAGNOSIS — K838 Other specified diseases of biliary tract: Secondary | ICD-10-CM | POA: Insufficient documentation

## 2017-08-16 DIAGNOSIS — K9189 Other postprocedural complications and disorders of digestive system: Secondary | ICD-10-CM | POA: Insufficient documentation

## 2017-08-16 DIAGNOSIS — J90 Pleural effusion, not elsewhere classified: Secondary | ICD-10-CM | POA: Diagnosis not present

## 2017-08-17 ENCOUNTER — Telehealth: Payer: Self-pay

## 2017-08-17 ENCOUNTER — Other Ambulatory Visit: Payer: Self-pay | Admitting: Internal Medicine

## 2017-08-17 ENCOUNTER — Other Ambulatory Visit: Payer: Self-pay | Admitting: Unknown Physician Specialty

## 2017-08-17 ENCOUNTER — Telehealth: Payer: Self-pay | Admitting: Internal Medicine

## 2017-08-17 DIAGNOSIS — J90 Pleural effusion, not elsewhere classified: Secondary | ICD-10-CM

## 2017-08-17 DIAGNOSIS — D333 Benign neoplasm of cranial nerves: Secondary | ICD-10-CM

## 2017-08-17 NOTE — Telephone Encounter (Signed)
Left vm for pt to return my call to schedule office appt per PCP request.

## 2017-08-18 NOTE — Discharge Instructions (Signed)
Thoracentesis, Care After °Refer to this sheet in the next few weeks. These instructions provide you with information about caring for yourself after your procedure. Your health care provider may also give you more specific instructions. Your treatment has been planned according to current medical practices, but problems sometimes occur. Call your health care provider if you have any problems or questions after your procedure. °What can I expect after the procedure? °After your procedure, it is common to have pain at the puncture site. °Follow these instructions at home: °· Take medicines only as directed by your health care provider. °· You may return to your normal diet and normal activities as directed by your health care provider. °· Drink enough fluid to keep your urine clear or pale yellow. °· Do not take baths, swim, or use a hot tub until your health care provider approves. °· Follow your health care provider's instructions about: °? Puncture site care. °? Bandage (dressing) changes and removal. °· Check your puncture site every day for signs of infection. Watch for: °? Redness, swelling, or pain. °? Fluid, blood, or pus. °· Keep all follow-up visits as directed by your health care provider. This is important. °Contact a health care provider if: °· You have redness, swelling, or pain at your puncture site. °· You have fluid, blood, or pus coming from your puncture site. °· You have a fever. °· You have chills. °· You have nausea or vomiting. °· You have trouble breathing. °· You develop a worsening cough. °Get help right away if: °· You have extreme shortness of breath. °· You develop chest pain. °· You faint or feel light-headed. °This information is not intended to replace advice given to you by your health care provider. Make sure you discuss any questions you have with your health care provider. °Document Released: 06/07/2014 Document Revised: 01/17/2016 Document Reviewed: 02/26/2014 °Elsevier  Interactive Patient Education © 2018 [employer] ° °

## 2017-08-19 ENCOUNTER — Ambulatory Visit
Admission: RE | Admit: 2017-08-19 | Discharge: 2017-08-19 | Disposition: A | Discharge status: Home / Self Care | Payer: Medicare Other | Source: Ambulatory Visit | Attending: Internal Medicine | Admitting: Internal Medicine

## 2017-08-19 ENCOUNTER — Ambulatory Visit
Admission: RE | Admit: 2017-08-19 | Discharge: 2017-08-19 | Disposition: A | Discharge status: Home / Self Care | Payer: Medicare Other | Source: Ambulatory Visit | Attending: Physician Assistant | Admitting: Physician Assistant

## 2017-08-19 ENCOUNTER — Other Ambulatory Visit: Payer: Self-pay | Admitting: Physician Assistant

## 2017-08-19 DIAGNOSIS — Z9889 Other specified postprocedural states: Secondary | ICD-10-CM

## 2017-08-19 DIAGNOSIS — J90 Pleural effusion, not elsewhere classified: Secondary | ICD-10-CM | POA: Insufficient documentation

## 2017-08-19 DIAGNOSIS — R1011 Right upper quadrant pain: Secondary | ICD-10-CM | POA: Diagnosis present

## 2017-08-19 LAB — ALBUMIN, PLEURAL OR PERITONEAL FLUID: Albumin, Fluid: 2.4 g/dL

## 2017-08-19 LAB — BODY FLUID CELL COUNT WITH DIFFERENTIAL
Eos, Fluid: 7 %
Lymphs, Fluid: 69 %
Monocyte-Macrophage-Serous Fluid: 15 %
Neutrophil Count, Fluid: 9 %
Other Cells, Fluid: 0 %
Total Nucleated Cell Count, Fluid: 1700 cu mm

## 2017-08-19 LAB — GLUCOSE, PLEURAL OR PERITONEAL FLUID: Glucose, Fluid: 114 mg/dL

## 2017-08-19 LAB — PROTEIN, PLEURAL OR PERITONEAL FLUID: Total protein, fluid: 4.6 g/dL

## 2017-08-19 LAB — LACTATE DEHYDROGENASE, PLEURAL OR PERITONEAL FLUID: LD, Fluid: 165 U/L — ABNORMAL HIGH (ref 3–23)

## 2017-08-19 LAB — AMYLASE, PLEURAL OR PERITONEAL FLUID: Amylase, Fluid: 23 U/L

## 2017-08-19 MED ORDER — IOPAMIDOL (ISOVUE-300) INJECTION 61%
100.0000 mL | Freq: Once | INTRAVENOUS | Status: AC | PRN
  Administered 2017-08-19: 100 mL via INTRAVENOUS

## 2017-08-20 LAB — TRIGLYCERIDES, BODY FLUIDS: Triglycerides, Fluid: 40 mg/dL

## 2017-08-20 NOTE — Procedures (Signed)
PROCEDURE SUMMARY:  Successful US guided right thoracentesis. Yielded 1.6 liters of hazy yellow fluid. Pt tolerated procedure well. No immediate complications.  Specimen was sent for labs.  Post procedure chest X-ray reveals no pneumothorax  Romen Yutzy S Dewel Lotter PA-C 08/20/2017 1:49 PM

## 2017-08-21 ENCOUNTER — Telehealth: Payer: Self-pay | Admitting: Internal Medicine

## 2017-08-21 DIAGNOSIS — J90 Pleural effusion, not elsewhere classified: Secondary | ICD-10-CM

## 2017-08-21 LAB — PH, BODY FLUID: pH, Body Fluid: 7.5

## 2017-08-21 NOTE — Telephone Encounter (Signed)
Patient did not get the labs I ordered last week at [facility] when she had the thoracentesis.  She will be coming by our lab on Monday to have them done.

## 2017-08-22 ENCOUNTER — Other Ambulatory Visit (INDEPENDENT_AMBULATORY_CARE_PROVIDER_SITE_OTHER): Payer: Medicare Other

## 2017-08-22 ENCOUNTER — Other Ambulatory Visit: Payer: Self-pay | Admitting: *Deleted

## 2017-08-22 DIAGNOSIS — J90 Pleural effusion, not elsewhere classified: Secondary | ICD-10-CM

## 2017-08-22 LAB — COMPREHENSIVE METABOLIC PANEL
AG Ratio: 1.5 (calc) (ref 1.0–2.5)
ALT: 11 U/L (ref 6–29)
AST: 15 U/L (ref 10–35)
Albumin: 3.5 g/dL — ABNORMAL LOW (ref 3.6–5.1)
Alkaline phosphatase (APISO): 90 U/L (ref 33–130)
BUN: 11 mg/dL (ref 7–25)
CO2: 27 mmol/L (ref 20–32)
Calcium: 8.8 mg/dL (ref 8.6–10.4)
Chloride: 108 mmol/L (ref 98–110)
Creat: 0.89 mg/dL (ref 0.50–0.99)
Globulin: 2.4 g/dL (calc) (ref 1.9–3.7)
Glucose, Bld: 105 mg/dL — ABNORMAL HIGH (ref 65–99)
Potassium: 3.7 mmol/L (ref 3.5–5.3)
Sodium: 143 mmol/L (ref 135–146)
Total Bilirubin: 0.6 mg/dL (ref 0.2–1.2)
Total Protein: 5.9 g/dL — ABNORMAL LOW (ref 6.1–8.1)

## 2017-08-22 LAB — BODY FLUID CULTURE: Culture: NO GROWTH

## 2017-08-22 LAB — CYTOLOGY - NON PAP

## 2017-08-22 LAB — LACTATE DEHYDROGENASE: LDH: 127 U/L (ref 120–250)

## 2017-08-22 NOTE — Progress Notes (Addendum)
[facility] Consultation      Assessment and Plan:  Right-sided pleural effusion. -Likely reactive right-sided pleural effusion status post thoracentesis on 3/22.  Discussed with patient that effusion may recur/reaccumulate, and may require additional drainage. - Repeat chest x-ray in approximately 2 weeks.  Cough and dyspnea on exertion. -Was likely secondary to underlying pleural effusion, appears to have improved after thoracentesis. - Patient is asked to inform us of any recurrence of cough or dyspnea as this may indicate that she needs a chest x-ray earlier than scheduled.  Orders Placed This Encounter  Procedures  . DG Chest 2 View   Return if symptoms worsen or fail to improve.   Addendum 09/07/17 Repeat chest x-ray reviewed, there is a small right pleural effusion which is slightly increased from previous.  We will repeat chest x-ray in approximately 1 month's time.  Date: 08/23/2017  MRN# [id number] Kathy Long 08-27-1950    Kathy Long is a 67 y.o. old female seen in consultation for chief complaint of:    Chief Complaint  Patient presents with  . Consult    referred by Dr. Tami Ribas for eval of cough:  Marland Kitchen Cough    x 1 mos mostly am with some production  . Chest Pain    slightly;pt had thoracentesis on last Friday and CT scan.    HPI:   Patient was admitted to the hospital in February 2019, for abdominal pain.  At that time she was found to have a bile leak on HIDA, scanning,complicating a cholecystectomy.  She underwent ERCP on 07/12/17 with sphincterotomy and stenting of the common bile duct.  She went to see her ENT doctor on 08/17/17, and was noted to have a persistent dry hacking cough.  Patient underwent a flexible laryngoscopy at that time which was unremarkable.  It was thought that the patient's cough may be secondary to right pleural effusion noted on CXR.  She was also noted to have an acoustic neuroma, an MRI was ordered. The  patient has since undergone a right thoracentesis on 08/19/17; Yielded 1.6 liters of hazy yellow fluid.  Review of the fluid testing suggestive of an exudative fluid, predominantly lymphocytic with normal pH, culture and cytology were negative.  This suggests a reactive effusion. After the thora the patient noted that she felt much better and her cough was much better.   She has a history of melanoma, only required resection, and did not require chemo/rads.  She has no history of smoking, her father smoked. Patient has a history of snoring, which resolved after palatectomy/uvulectomy.  **Imaging personally reviewed, chest x-ray 08/19/17 there is scarring along the fissure in the right lung, there is right basal atelectasis, there is significant scoliosis.   PMHX:   Past Medical History:  Diagnosis Date  . Acoustic neuroma (HCC)    left ear  . Cancer (HCC)    melanoma, shoulder  . Depression   . Hyperlipidemia   . Hypertension    Surgical Hx:  Past Surgical History:  Procedure Laterality Date  . APPENDECTOMY  2010  . BACK SURGERY     disectomy lumbar, scar tissue  . BREAST CYST EXCISION Right 1970   axilla  . CHOLECYSTECTOMY N/A 07/05/2017   Procedure: LAPAROSCOPIC CHOLECYSTECTOMY WITH INTRAOPERATIVE CHOLANGIOGRAM;  Surgeon: Robert Bellow, MD;  Location: [facility] ORS;  Service: General;  Laterality: N/A;  . COLONOSCOPY     2009 and color gaurd in 2018  . ERCP N/A 07/12/2017  Procedure: ENDOSCOPIC RETROGRADE CHOLANGIOPANCREATOGRAPHY (ERCP);  Surgeon: Lucilla Lame, MD;  Location: [facility] ENDOSCOPY;  Service: Endoscopy;  Laterality: N/A;  . FOOT SURGERY     x 2  . MELANOMA EXCISION Left 2000  . PALATOPLASTY N/A 04/08/2015   Procedure: PALATOPLASTY;  Surgeon: Beverly Gust, MD;  Location: [facility] ORS;  Service: ENT;  Laterality: N/A;  . TONSILLECTOMY N/A 04/08/2015   Procedure: TONSILLECTOMY;  Surgeon: Beverly Gust, MD;  Location: [facility] ORS;  Service: ENT;  Laterality: N/A;  . TUBAL  LIGATION     Family Hx:  Family History  Problem Relation Age of Onset  . Cancer Sister 3       ovarian ca  . Breast cancer Neg Hx    Social Hx:   Social History   Tobacco Use  . Smoking status: Never Smoker  . Smokeless tobacco: Never Used  Substance Use Topics  . Alcohol use: Yes    Alcohol/week: 0.0 oz    Comment: occ  . Drug use: No   Medication:    Current Outpatient Medications:  .  amLODipine (NORVASC) 5 MG tablet, Take 1 tablet (5 mg total) by mouth daily. MUST KEEP APPT IN January FOR FURTHER REFILLS, Disp: 30 tablet, Rfl: 0 .  b complex vitamins tablet, Take 1 tablet by mouth daily., Disp: , Rfl:  .  Cholecalciferol 1000 UNITS tablet, Take 1,000 Units by mouth 2 (two) times daily., Disp: , Rfl:  .  citalopram (CELEXA) 20 MG tablet, TAKE ONE TABLET EVERY DAY, Disp: 90 tablet, Rfl: 1 .  Melatonin 3 MG CAPS, Take 3 mg by mouth at bedtime., Disp: , Rfl:  .  metoprolol succinate (TOPROL-XL) 50 MG 24 hr tablet, TAKE ONE TABLET BY MOUTH EVERY DAY, Disp: 90 tablet, Rfl: 1 .  mirabegron ER (MYRBETRIQ) 25 MG TB24 tablet, Take 1 tablet (25 mg total) by mouth daily., Disp: 30 tablet, Rfl: 5 .  omeprazole (PRILOSEC) 40 MG capsule, TAKE 1 CAPSULE TWICE DAILY NEEDS APPT FOR MORE REFILLS (Patient taking differently: TAKE 1 CAPSULE BY MOUTH EVERY MORNING), Disp: 180 capsule, Rfl: 1 .  rosuvastatin (CRESTOR) 10 MG tablet, TAKE ONE TABLET EVERY DAY, Disp: 90 tablet, Rfl: 1 .  vitamin E 400 UNIT capsule, Take 400 Units by mouth daily., Disp: , Rfl:  .  dicyclomine (BENTYL) 10 MG capsule, Take 1 capsule (10 mg total) by mouth 4 (four) times daily -  before meals and at bedtime. (Patient not taking: Reported on 08/23/2017), Disp: 120 capsule, Rfl: 0   Allergies:  Morphine and related; Tramadol; and Tape  Review of Systems: Gen:  Denies  fever, sweats, chills HEENT: Denies blurred vision, double vision. bleeds, sore throat Cvc:  No dizziness, chest pain. Resp:   Denies cough or sputum  production, shortness of breath Gi: Denies swallowing difficulty, stomach pain. Gu:  Denies bladder incontinence, burning urine Ext:   No Joint pain, stiffness. Skin: No skin rash,  hives  Endoc:  No polyuria, polydipsia. Psych: No depression, insomnia. Other:  All other systems were reviewed with the patient and were negative other that what is mentioned in the HPI.   Physical Examination:   VS: BP 106/70 (BP Location: Left Arm, Cuff Size: Normal)   Pulse (!) 54   Resp 16   Ht 5\' 4"  (1.626 m)   Wt 158 lb (71.7 kg)   SpO2 94%   BMI 27.12 kg/m   General Appearance: No distress  Neuro:without focal findings,  speech normal,  HEENT: PERRLA, EOM intact.  Pulmonary: normal breath sounds, No wheezing.  CardiovascularNormal S1,S2.  No m/r/g.   Abdomen: Benign, Soft, non-tender. Renal:  No costovertebral tenderness  GU:  No performed at this time. Endoc: No evident thyromegaly, no signs of acromegaly. Skin:   warm, no rashes, no ecchymosis  Extremities: normal, no cyanosis, clubbing.  Other findings:    LABORATORY PANEL:   CBC No results for input(s): WBC, HGB, HCT, PLT in the last 168 hours. ------------------------------------------------------------------------------------------------------------------  Chemistries  Recent Labs  Lab 08/22/17 1040  NA 143  K 3.7  CL 108  CO2 27  GLUCOSE 105*  BUN 11  CREATININE 0.89  CALCIUM 8.8  AST 15  ALT 11  BILITOT 0.6   ------------------------------------------------------------------------------------------------------------------  Cardiac Enzymes No results for input(s): TROPONINI in the last 168 hours. ------------------------------------------------------------  RADIOLOGY:  No results found.     Thank  you for the consultation and for allowing [facility] to assist in the care of your patient. Our recommendations are noted above.  Please contact us if we can be of further  service.   Marda Stalker, MD.  Board Certified in Internal Medicine, Pulmonary Medicine, [place], and Sleep Medicine.  [facility] Office Number: [phone]  Patricia Pesa, M.D.  Merton Border, M.D  08/23/2017

## 2017-08-22 NOTE — Telephone Encounter (Signed)
Thank you :)

## 2017-08-22 NOTE — Addendum Note (Signed)
Addended by: Leeanne Rio on: 08/22/2017 10:40 AM   Modules accepted: Orders

## 2017-08-22 NOTE — Telephone Encounter (Signed)
I will fix labs. They were not ordered "future" & they were ordered to [facility].

## 2017-08-22 NOTE — Addendum Note (Signed)
Addended by: Arby Barrette on: 08/22/2017 09:33 AM   Modules accepted: Orders

## 2017-08-22 NOTE — Telephone Encounter (Signed)
Correction: Pt came in this morning @ 9:30 and labs were drawn.

## 2017-08-23 ENCOUNTER — Ambulatory Visit: Payer: Medicare Other | Admitting: Internal Medicine

## 2017-08-23 ENCOUNTER — Encounter: Payer: Self-pay | Admitting: Internal Medicine

## 2017-08-23 VITALS — BP 106/70 | HR 54 | Resp 16 | Ht 64.0 in | Wt 158.0 lb

## 2017-08-23 DIAGNOSIS — J90 Pleural effusion, not elsewhere classified: Secondary | ICD-10-CM | POA: Diagnosis not present

## 2017-08-23 NOTE — Patient Instructions (Signed)
Call us back after you have had the chest x ray in 2 weeks. If symptoms of cough or dyspnea occur before then, please call us sooner for a chest xray.

## 2017-08-24 ENCOUNTER — Other Ambulatory Visit: Payer: Self-pay | Admitting: Internal Medicine

## 2017-08-27 LAB — CHOLESTEROL, BODY FLUID: Cholesterol, Fluid: 72 mg/dL

## 2017-09-06 ENCOUNTER — Ambulatory Visit
Admission: RE | Admit: 2017-09-06 | Discharge: 2017-09-06 | Disposition: A | Discharge status: Home / Self Care | Payer: Medicare Other | Source: Ambulatory Visit | Attending: Internal Medicine | Admitting: Internal Medicine

## 2017-09-06 ENCOUNTER — Telehealth: Payer: Self-pay | Admitting: Internal Medicine

## 2017-09-06 DIAGNOSIS — J9811 Atelectasis: Secondary | ICD-10-CM | POA: Diagnosis not present

## 2017-09-06 DIAGNOSIS — J90 Pleural effusion, not elsewhere classified: Secondary | ICD-10-CM | POA: Insufficient documentation

## 2017-09-06 NOTE — Telephone Encounter (Signed)
Please advise on message below.

## 2017-09-06 NOTE — Telephone Encounter (Signed)
Patient has completed chest xray - was told at last visit to call and make office aware  Please call to discuss next steps once Dr. Juanell Fairly has had a chance to review results

## 2017-09-07 ENCOUNTER — Encounter: Payer: Self-pay | Admitting: Gastroenterology

## 2017-09-07 ENCOUNTER — Ambulatory Visit: Payer: Medicare Other | Admitting: Gastroenterology

## 2017-09-07 ENCOUNTER — Other Ambulatory Visit: Payer: Self-pay

## 2017-09-07 VITALS — BP 123/62 | HR 54 | Ht 64.0 in | Wt 162.0 lb

## 2017-09-07 DIAGNOSIS — K838 Other specified diseases of biliary tract: Secondary | ICD-10-CM

## 2017-09-07 NOTE — Telephone Encounter (Signed)
Slight increase in the effusion, but still too small to drain. Ordered repeat CXR in 1 month, call earlier if notice trouble breathing.

## 2017-09-07 NOTE — Progress Notes (Signed)
Primary Care Physician: [name], MD  Primary Gastroenterologist:  Dr. Lucilla Lame  Chief Complaint  Patient presents with  . RUQ abdominal pain    HPI: Kathy Long is a 67 y.o. female here for follow-up after being in the hospital for a cholecystectomy.  After having a cholecystectomy the patient was found to have a bile duct leak.  This happened in February of this year.  The patient then subsequently underwent an ERCP with a stent placement.  The patient's liver enzymes 2 weeks ago were normal.  Ports that she has abdominal pain across her entire abdomen in the upper and lower area.  The patient states that she was found to have a pleural effusion on the right and went to see pulmonology.  She also reports that after he drained the fluid she has not had any further pains.  She is supposed to have her stent taken out at the beginning of May.  Current Outpatient Medications  Medication Sig Dispense Refill  . amLODipine (NORVASC) 5 MG tablet Take 1 tablet (5 mg total) by mouth daily. MUST KEEP APPT IN January FOR FURTHER REFILLS 30 tablet 0  . b complex vitamins tablet Take 1 tablet by mouth daily.    . Cholecalciferol 1000 UNITS tablet Take 1,000 Units by mouth 2 (two) times daily.    . citalopram (CELEXA) 20 MG tablet TAKE ONE TABLET EVERY DAY 90 tablet 1  . Melatonin 3 MG CAPS Take 3 mg by mouth at bedtime.    . metoprolol succinate (TOPROL-XL) 50 MG 24 hr tablet TAKE ONE TABLET BY MOUTH EVERY DAY 90 tablet 1  . mirabegron ER (MYRBETRIQ) 25 MG TB24 tablet Take 1 tablet (25 mg total) by mouth daily. 30 tablet 5  . omeprazole (PRILOSEC) 40 MG capsule TAKE 1 CAPSULE TWICE DAILY NEEDS APPT FOR MORE REFILLS (Patient taking differently: TAKE 1 CAPSULE BY MOUTH EVERY MORNING) 180 capsule 1  . rosuvastatin (CRESTOR) 10 MG tablet TAKE ONE TABLET EVERY DAY 90 tablet 1  . vitamin E 400 UNIT capsule Take 400 Units by mouth daily.    Marland Kitchen dicyclomine (BENTYL) 10 MG capsule TAKE 1 CAPSULE BY  MOUTH 4 TIMES DAILY BEFORE MEALS AND AT BEDTIME (Patient not taking: Reported on 09/07/2017) 120 capsule 2   No current facility-administered medications for this visit.     Allergies as of 09/07/2017 - Review Complete 09/07/2017  Allergen Reaction Noted  . Morphine and related Other (See Comments) 07/05/2017  . Tramadol Other (See Comments) 09/25/2014  . Tape Rash 09/25/2014    ROS:  General: Negative for anorexia, weight loss, fever, chills, fatigue, weakness. ENT: Negative for hoarseness, difficulty swallowing , nasal congestion. CV: Negative for chest pain, angina, palpitations, dyspnea on exertion, peripheral edema.  Respiratory: Negative for dyspnea at rest, dyspnea on exertion, cough, sputum, wheezing.  GI: See history of present illness. GU:  Negative for dysuria, hematuria, urinary incontinence, urinary frequency, nocturnal urination.  Endo: Negative for unusual weight change.    Physical Examination:   BP 123/62   Pulse (!) 54   Ht 5\' 4"  (1.626 m)   Wt 162 lb (73.5 kg)   BMI 27.81 kg/m   General: Well-nourished, well-developed in no acute distress.  Eyes: No icterus. Conjunctivae pink. Mouth: Oropharyngeal mucosa moist and pink , no lesions erythema or exudate. Lungs: Clear to auscultation bilaterally. Non-labored. Heart: Regular rate and rhythm, no murmurs rubs or gallops.  Abdomen: Bowel sounds are normal, nontender, nondistended, no  hepatosplenomegaly or masses, no abdominal bruits or hernia , no rebound or guarding.   Extremities: No lower extremity edema. No clubbing or deformities. Neuro: Alert and oriented x 3.  Grossly intact. Skin: Warm and dry, no jaundice.   Psych: Alert and cooperative, normal mood and affect.  Labs:    Imaging Studies: Dg Chest 1 View  Result Date: 08/19/2017 CLINICAL DATA:  Thoracentesis EXAM: CHEST  1 VIEW COMPARISON:  12/13/2011 FINDINGS: There is no evidence of pneumothorax after right thoracentesis. Linear atelectasis in the  right mid and lower lung zone. Mild subsegmental atelectasis at the left base. Scoliosis. Normal heart size. Biliary stent projects over the right paraspinal region of the abdomen. IMPRESSION: No pneumothorax. Electronically Signed   By: Marybelle Killings M.D.   On: 08/19/2017 09:52   Dg Chest 2 View  Result Date: 09/06/2017 CLINICAL DATA:  History of pleural effusion.  Hypertension EXAM: CHEST - 2 VIEW COMPARISON:  August 19, 2017 FINDINGS: There is a fairly small but present pleural effusion with atelectasis in the right base. The left lung is clear. Heart size and pulmonary vascularity are normal. No adenopathy. There are surgical clips in left axilla. No bone lesions. There is upper lumbar levoscoliosis. IMPRESSION: Fairly small right pleural effusion with right base atelectasis. Lungs elsewhere clear. Heart size normal. No adenopathy evident. Electronically Signed   By: Lowella Grip III M.D.   On: 09/06/2017 08:57   Ct Abdomen Pelvis W Contrast  Result Date: 08/19/2017 CLINICAL DATA:  Status post cholecystectomy 07/05/2017. Right upper quadrant pain. EXAM: CT ABDOMEN AND PELVIS WITH CONTRAST TECHNIQUE: Multidetector CT imaging of the abdomen and pelvis was performed using the standard protocol following bolus administration of intravenous contrast. CONTRAST:  123mL ISOVUE-300 IOPAMIDOL (ISOVUE-300) INJECTION 61% COMPARISON:  07/08/2017 FINDINGS: Lower chest: Small right pleural effusion. Right basilar airspace disease likely reflecting compressive atelectasis. Hazy airspace disease in the right middle lobe likely reflecting resolving atelectasis given recent thoracentesis versus less likely pneumonia. Hepatobiliary: 9 mm hypodense, fluid attenuating right hepatic mass most consistent with a cyst. Status post cholecystectomy. No biliary dilatation. No perihepatic fluid collection. Near complete interval resolution of the fluid collection along the inferior margin of the liver with a 5 mm residual area of  fluid persisting. Biliary stent is present in the common bile duct in satisfactory position. Small volume pneumobilia. Pancreas: Unremarkable. No pancreatic ductal dilatation or surrounding inflammatory changes. Spleen: Normal in size without focal abnormality. Adrenals/Urinary Tract: Adrenal glands are unremarkable. Kidneys are normal, without renal calculi, focal lesion, or hydronephrosis. Bladder is unremarkable. Stomach/Bowel: Stomach is within normal limits. Appendix appears normal. No evidence of bowel wall thickening, distention, or inflammatory changes. Vascular/Lymphatic: No significant vascular findings are present. No enlarged abdominal or pelvic lymph nodes. Reproductive: Uterus and bilateral adnexa are unremarkable. Other: No abdominal wall hernia or abnormality. No abdominopelvic ascites. Musculoskeletal: No acute osseous abnormality. No aggressive osseous lesion. Levoscoliosis of the lumbar spine. Degenerative disc disease with disc height loss at L1-2 and L5-S1. Right foraminal stenosis at L1-2 and L4-5. Left foraminal stenosis at L5-S1. IMPRESSION: 1. No perihepatic fluid collection. Near complete interval resolution of the fluid collection along the inferior margin of the liver with a 5 mm residual area of fluid persisting. Biliary stent is present in the common bile duct in satisfactory position. Small volume pneumobilia. 2. No acute abdominal or pelvic pathology. 3. Small right pleural effusion. Right basilar airspace disease likely reflecting compressive atelectasis. Hazy airspace disease in the right middle lobe likely  reflecting resolving atelectasis given recent thoracentesis versus less likely pneumonia. Electronically Signed   By: Kathreen Devoid   On: 08/19/2017 11:07   US Breast Ltd Uni Right Inc Axilla  Result Date: 08/11/2017 CLINICAL DATA:  67 year old female with a tender lump in the right axilla. The patient states she has had a cyst removed in this location many many years ago and  since then has always had a small area of thickening/lump in this location, however it has become recently tender. EXAM: DIGITAL DIAGNOSTIC UNILATERAL RIGHT MAMMOGRAM WITH CAD AND TOMO RIGHT BREAST ULTRASOUND COMPARISON:  Previous exam(s). ACR Breast Density Category c: The breast tissue is heterogeneously dense, which may obscure small masses. FINDINGS: Spot compression tangential views were performed over the palpable/painful area of concern in the right axilla demonstrating an oval fat containing mass which appears to be within the skin measuring approximately 6 mm. Mammographic images were processed with CAD. Physical examination at site of palpable concern/tenderness in the high right axilla demonstrates a small nodular area of thickening within the skin. Targeted ultrasound of the right axilla was performed demonstrating an oval circumscribed mixed echogenicity mass which appears hyperechoic peripherally and hypoechoic centrally. This measures 0.7 x 0.6 x 0.8 cm and corresponds well with the small mass seen in the right axilla at mammography. This may represent a small area of fat necrosis or sebaceous cyst. IMPRESSION: Benign-appearing nodule at site of palpable concern/tenderness within the skin of the high right axilla, possibly a small sebaceous cyst or even area of fat necrosis. RECOMMENDATION: Clinical follow-up recommended for palpable and tender abnormality within the skin of the high right axilla. I have discussed the findings and recommendations with the patient. Results were also provided in writing at the conclusion of the visit. If applicable, a reminder letter will be sent to the patient regarding the next appointment. BI-RADS CATEGORY  2: Benign. Electronically Signed   By: Everlean Alstrom M.D.   On: 08/11/2017 14:31   Mm Diag Breast Tomo Uni Right  Result Date: 08/11/2017 CLINICAL DATA:  67 year old female with a tender lump in the right axilla. The patient states she has had a cyst  removed in this location many many years ago and since then has always had a small area of thickening/lump in this location, however it has become recently tender. EXAM: DIGITAL DIAGNOSTIC UNILATERAL RIGHT MAMMOGRAM WITH CAD AND TOMO RIGHT BREAST ULTRASOUND COMPARISON:  Previous exam(s). ACR Breast Density Category c: The breast tissue is heterogeneously dense, which may obscure small masses. FINDINGS: Spot compression tangential views were performed over the palpable/painful area of concern in the right axilla demonstrating an oval fat containing mass which appears to be within the skin measuring approximately 6 mm. Mammographic images were processed with CAD. Physical examination at site of palpable concern/tenderness in the high right axilla demonstrates a small nodular area of thickening within the skin. Targeted ultrasound of the right axilla was performed demonstrating an oval circumscribed mixed echogenicity mass which appears hyperechoic peripherally and hypoechoic centrally. This measures 0.7 x 0.6 x 0.8 cm and corresponds well with the small mass seen in the right axilla at mammography. This may represent a small area of fat necrosis or sebaceous cyst. IMPRESSION: Benign-appearing nodule at site of palpable concern/tenderness within the skin of the high right axilla, possibly a small sebaceous cyst or even area of fat necrosis. RECOMMENDATION: Clinical follow-up recommended for palpable and tender abnormality within the skin of the high right axilla. I have discussed the findings and  recommendations with the patient. Results were also provided in writing at the conclusion of the visit. If applicable, a reminder letter will be sent to the patient regarding the next appointment. BI-RADS CATEGORY  2: Benign. Electronically Signed   By: Everlean Alstrom M.D.   On: 08/11/2017 14:31   US Abdomen Limited Ruq  Result Date: 08/16/2017 CLINICAL DATA:  Right upper quadrant pain. Status post cholecystectomy. EXAM:  ULTRASOUND ABDOMEN LIMITED RIGHT UPPER QUADRANT COMPARISON:  CT abdomen 07/08/2017 FINDINGS: Gallbladder: Interval cholecystectomy. Small amount of fluid in the region of the gallbladder fossa likely reflecting resolving postsurgical changes. No sizable focal fluid collection. Common bile duct: Diameter: 8 mm Liver: No solid hepatic mass. 0.8 x 1.2 x 1.3 cm anechoic right hepatic mass most consistent with a cyst. Within normal limits in parenchymal echogenicity. Portal vein is patent on color Doppler imaging with normal direction of blood flow towards the liver. Other: Moderate size right pleural effusion. IMPRESSION: 1. Interval cholecystectomy. Small amount of fluid in the region of the gallbladder fossa likely reflecting resolving postsurgical changes. No sizable fluid collection. If there is persisting clinical concern regarding a bile leak, recommend repeat HIDA scan. 2. Moderate-sized right pleural effusion incompletely characterized. Recommend further evaluation with a chest x-ray. Electronically Signed   By: Kathreen Devoid   On: 08/16/2017 09:58   US Thoracentesis Asp Pleural Space W/img Guide  Result Date: 08/19/2017 INDICATION: Recent cholecystectomy complicated by bile leak requiring ERCP/biliary stent. Persistent cough. Right pleural effusion. Request for diagnostic and therapeutic thoracentesis. EXAM: ULTRASOUND GUIDED RIGHT THORACENTESIS MEDICATIONS: 1% Lidocaine = 10 mL COMPLICATIONS: None immediate. PROCEDURE: An ultrasound guided thoracentesis was thoroughly discussed with the patient and questions answered. The benefits, risks, alternatives and complications were also discussed. The patient understands and wishes to proceed with the procedure. Written consent was obtained. Ultrasound was performed to localize and mark an adequate pocket of fluid in the right chest. The area was then prepped and draped in the normal sterile fashion. 1% Lidocaine was used for local anesthesia. Under ultrasound  guidance a 6 Fr Safe-T-Centesis catheter was introduced. Thoracentesis was performed. The catheter was removed and a dressing applied. FINDINGS: A total of approximately 1.6 liters of hazy yellow fluid was removed. Samples were sent to the laboratory as requested by the clinical team. IMPRESSION: Successful ultrasound guided right thoracentesis yielding of pleural fluid. No pneumothorax on post procedure chest X-ray. Read by: Gareth Eagle, PA-C Electronically Signed   By: Marybelle Killings M.D.   On: 08/19/2017 10:17    Assessment and Plan:   Kathy Long is a 67 y.o. y/o female who comes in with a history of abdominal pain that has resolved after she had a thoracentesis with drainage of fluid.  The patient is following up with pulmonology.  Patient's abdominal pain has completely resolved.  The patient will be set up for a stent removal due to her bile duct leak to be done in May.  The patient has been explained the plan and agrees with it.    Lucilla Lame, MD. Marval Regal   Note: This dictation was prepared with Dragon dictation along with smaller phrase technology. Any transcriptional errors that result from this process are unintentional.

## 2017-09-07 NOTE — Addendum Note (Signed)
Addended by: Laverle Hobby on: 09/07/2017 04:21 PM   Modules accepted: Orders

## 2017-09-08 NOTE — Telephone Encounter (Signed)
Pt informed of response. Nothing further needed. 

## 2017-09-22 ENCOUNTER — Other Ambulatory Visit: Payer: Self-pay | Admitting: Internal Medicine

## 2017-09-28 ENCOUNTER — Ambulatory Visit
Admission: RE | Admit: 2017-09-28 | Discharge: 2017-09-28 | Disposition: A | Discharge status: Home / Self Care | Payer: Medicare Other | Source: Ambulatory Visit | Attending: Unknown Physician Specialty | Admitting: Unknown Physician Specialty

## 2017-09-28 DIAGNOSIS — D333 Benign neoplasm of cranial nerves: Secondary | ICD-10-CM | POA: Diagnosis present

## 2017-09-28 DIAGNOSIS — I739 Peripheral vascular disease, unspecified: Secondary | ICD-10-CM | POA: Insufficient documentation

## 2017-09-28 MED ORDER — GADOBENATE DIMEGLUMINE 529 MG/ML IV SOLN
15.0000 mL | Freq: Once | INTRAVENOUS | Status: AC | PRN
  Administered 2017-09-28: 15 mL via INTRAVENOUS

## 2017-10-03 ENCOUNTER — Telehealth: Payer: Self-pay | Admitting: Gastroenterology

## 2017-10-03 NOTE — Telephone Encounter (Signed)
Pt states she has procedure  With Dr. Allen Norris to remover stint she would l;ike a call for instructions and time of procedure

## 2017-10-03 NOTE — Telephone Encounter (Signed)
Pt was given instructions for her ERCP scheduled for tomorrow.

## 2017-10-04 ENCOUNTER — Encounter: Payer: Self-pay | Admitting: *Deleted

## 2017-10-04 ENCOUNTER — Ambulatory Visit: Payer: Medicare Other | Admitting: Certified Registered"

## 2017-10-04 ENCOUNTER — Encounter
Admission: RE | Disposition: A | Discharge status: Home / Self Care | Payer: Self-pay | Source: Ambulatory Visit | Attending: Gastroenterology

## 2017-10-04 ENCOUNTER — Ambulatory Visit: Payer: Medicare Other

## 2017-10-04 ENCOUNTER — Ambulatory Visit
Admission: RE | Admit: 2017-10-04 | Discharge: 2017-10-04 | Disposition: A | Discharge status: Home / Self Care | Payer: Medicare Other | Source: Ambulatory Visit | Attending: Gastroenterology | Admitting: Gastroenterology

## 2017-10-04 DIAGNOSIS — Z85820 Personal history of malignant melanoma of skin: Secondary | ICD-10-CM | POA: Insufficient documentation

## 2017-10-04 DIAGNOSIS — I10 Essential (primary) hypertension: Secondary | ICD-10-CM | POA: Insufficient documentation

## 2017-10-04 DIAGNOSIS — F329 Major depressive disorder, single episode, unspecified: Secondary | ICD-10-CM | POA: Insufficient documentation

## 2017-10-04 DIAGNOSIS — K838 Other specified diseases of biliary tract: Secondary | ICD-10-CM

## 2017-10-04 DIAGNOSIS — Z79899 Other long term (current) drug therapy: Secondary | ICD-10-CM | POA: Insufficient documentation

## 2017-10-04 DIAGNOSIS — Z885 Allergy status to narcotic agent status: Secondary | ICD-10-CM | POA: Insufficient documentation

## 2017-10-04 DIAGNOSIS — Z4659 Encounter for fitting and adjustment of other gastrointestinal appliance and device: Secondary | ICD-10-CM | POA: Diagnosis not present

## 2017-10-04 DIAGNOSIS — Z888 Allergy status to other drugs, medicaments and biological substances status: Secondary | ICD-10-CM | POA: Insufficient documentation

## 2017-10-04 DIAGNOSIS — E785 Hyperlipidemia, unspecified: Secondary | ICD-10-CM | POA: Diagnosis not present

## 2017-10-04 HISTORY — PX: ERCP: SHX5425

## 2017-10-04 SURGERY — ERCP, WITH INTERVENTION IF INDICATED
Anesthesia: General

## 2017-10-04 MED ORDER — MIDAZOLAM HCL 2 MG/2ML IJ SOLN
INTRAMUSCULAR | Status: DC | PRN
  Administered 2017-10-04: 2 mg via INTRAVENOUS

## 2017-10-04 MED ORDER — SODIUM CHLORIDE 0.9 % IV SOLN
INTRAVENOUS | Status: DC
  Administered 2017-10-04: 1000 mL via INTRAVENOUS

## 2017-10-04 MED ORDER — MIDAZOLAM HCL 2 MG/2ML IJ SOLN
INTRAMUSCULAR | Status: AC
Start: 2017-10-04 — End: ?
  Filled 2017-10-04: qty 2

## 2017-10-04 MED ORDER — PROPOFOL 10 MG/ML IV BOLUS
INTRAVENOUS | Status: DC | PRN
  Administered 2017-10-04: 70 mg via INTRAVENOUS

## 2017-10-04 MED ORDER — LIDOCAINE HCL (PF) 1 % IJ SOLN
2.0000 mL | Freq: Once | INTRAMUSCULAR | Status: AC
  Administered 2017-10-04: 0.3 mL via INTRADERMAL

## 2017-10-04 MED ORDER — PROPOFOL 500 MG/50ML IV EMUL
INTRAVENOUS | Status: AC
  Filled 2017-10-04: qty 50

## 2017-10-04 MED ORDER — LIDOCAINE HCL (PF) 1 % IJ SOLN
INTRAMUSCULAR | Status: AC
  Administered 2017-10-04: 0.3 mL via INTRADERMAL
  Filled 2017-10-04: qty 2

## 2017-10-04 MED ORDER — LIDOCAINE HCL (CARDIAC) PF 100 MG/5ML IV SOSY
PREFILLED_SYRINGE | INTRAVENOUS | Status: DC | PRN
  Administered 2017-10-04: 100 mg via INTRAVENOUS

## 2017-10-04 MED ORDER — PROPOFOL 500 MG/50ML IV EMUL
INTRAVENOUS | Status: DC | PRN
  Administered 2017-10-04: 130 ug/kg/min via INTRAVENOUS

## 2017-10-04 MED ORDER — LIDOCAINE HCL (PF) 2 % IJ SOLN
INTRAMUSCULAR | Status: AC
Start: 2017-10-04 — End: ?
  Filled 2017-10-04: qty 10

## 2017-10-04 NOTE — Transfer of Care (Signed)
Immediate Anesthesia Transfer of Care Note  Patient: Kathy Long  Procedure(s) Performed: ENDOSCOPIC RETROGRADE CHOLANGIOPANCREATOGRAPHY (ERCP) (N/A )  Patient Location: PACU  Anesthesia Type:General  Level of Consciousness: sedated  Airway & Oxygen Therapy: Patient Spontanous Breathing and Patient connected to nasal cannula oxygen  Post-op Assessment: Report given to RN and Post -op Vital signs reviewed and stable  Post vital signs: Reviewed and stable  Last Vitals:  Vitals Value Taken Time  BP 127/66 10/04/2017 11:12 AM  Temp    Pulse 47 10/04/2017 11:12 AM  Resp 15 10/04/2017 11:12 AM  SpO2 96 % 10/04/2017 11:12 AM    Last Pain:  Vitals:   10/04/17 0931  TempSrc: Tympanic         Complications: No apparent anesthesia complications

## 2017-10-04 NOTE — Anesthesia Preprocedure Evaluation (Signed)
Anesthesia Evaluation  Patient identified by MRN, date of birth, ID band Patient awake    Reviewed: Allergy & Precautions, H&P , NPO status , Patient's Chart, lab work & pertinent test results  History of Anesthesia Complications Negative for: history of anesthetic complications  Airway Mallampati: III  TM Distance: <3 FB Neck ROM: full    Dental  (+) Chipped, Poor Dentition   Pulmonary neg pulmonary ROS, neg shortness of breath,           Cardiovascular Exercise Tolerance: Good hypertension, (-) angina(-) Past MI and (-) DOE      Neuro/Psych PSYCHIATRIC DISORDERS Depression  Neuromuscular disease    GI/Hepatic negative GI ROS, Neg liver ROS, neg GERD  ,  Endo/Other  negative endocrine ROS  Renal/GU negative Renal ROS  negative genitourinary   Musculoskeletal   Abdominal   Peds  Hematology negative hematology ROS (+)   Anesthesia Other Findings Past Medical History: No date: Acoustic neuroma (HCC)     Comment:  left ear No date: Cancer ([place])     Comment:  melanoma, shoulder No date: Depression No date: Hyperlipidemia No date: Hypertension  Past Surgical History: 2010: APPENDECTOMY No date: BACK SURGERY     Comment:  disectomy lumbar, scar tissue 07/05/2017: CHOLECYSTECTOMY; N/A     Comment:  Procedure: LAPAROSCOPIC CHOLECYSTECTOMY WITH               INTRAOPERATIVE CHOLANGIOGRAM;  Surgeon: Robert Bellow, MD;  Location: [facility] ORS;  Service: General;                Laterality: N/A; No date: COLONOSCOPY     Comment:  2009 and color gaurd in 2018 No date: FOOT SURGERY     Comment:  x 2 2000: MELANOMA EXCISION; Left 04/08/2015: PALATOPLASTY; N/A     Comment:  Procedure: PALATOPLASTY;  Surgeon: Beverly Gust, MD;               Location: [facility] ORS;  Service: ENT;  Laterality: N/A; 04/08/2015: TONSILLECTOMY; N/A     Comment:  Procedure: TONSILLECTOMY;  Surgeon: Beverly Gust, MD;   Location: [facility] ORS;  Service: ENT;  Laterality: N/A; No date: TUBAL LIGATION  BMI    Body Mass Index:  31.22 kg/m      Reproductive/Obstetrics negative OB ROS                             Anesthesia Physical  Anesthesia Plan  ASA: III  Anesthesia Plan: General   Post-op Pain Management:    Induction: Intravenous  PONV Risk Score and Plan: Propofol infusion and TIVA  Airway Management Planned: Natural Airway and Nasal Cannula  Additional Equipment:   Intra-op Plan:   Post-operative Plan:   Informed Consent: I have reviewed the patients History and Physical, chart, labs and discussed the procedure including the risks, benefits and alternatives for the proposed anesthesia with the patient or authorized representative who has indicated his/her understanding and acceptance.   Dental Advisory Given  Plan Discussed with: Anesthesiologist, CRNA and Surgeon  Anesthesia Plan Comments: (Patient consented for risks of anesthesia including but not limited to:  - adverse reactions to medications - risk of intubation if required - damage to teeth, lips or other oral mucosa - sore throat or hoarseness - Damage to heart, brain, lungs or loss of life  Patient voiced understanding.)  Anesthesia Quick Evaluation  

## 2017-10-04 NOTE — H&P (Signed)
Lucilla Lame, MD [address] [place], [place] [address] Phone:[phone] Fax : [phone]  Primary Care Physician:  [name], MD Primary Gastroenterologist:  Dr. Allen Norris  Pre-Procedure History & Physical: HPI:  Kathy Long is a 67 y.o. female is here for an ERCP.   Past Medical History:  Diagnosis Date  . Acoustic neuroma (HCC)    left ear  . Cancer (HCC)    melanoma, shoulder  . Depression   . Hyperlipidemia   . Hypertension     Past Surgical History:  Procedure Laterality Date  . APPENDECTOMY  2010  . BACK SURGERY     disectomy lumbar, scar tissue  . BREAST CYST EXCISION Right 1970   axilla  . CHOLECYSTECTOMY N/A 07/05/2017   Procedure: LAPAROSCOPIC CHOLECYSTECTOMY WITH INTRAOPERATIVE CHOLANGIOGRAM;  Surgeon: Robert Bellow, MD;  Location: [facility] ORS;  Service: General;  Laterality: N/A;  . COLONOSCOPY     2009 and color gaurd in 2018  . ERCP N/A 07/12/2017   Procedure: ENDOSCOPIC RETROGRADE CHOLANGIOPANCREATOGRAPHY (ERCP);  Surgeon: Lucilla Lame, MD;  Location: [facility] ENDOSCOPY;  Service: Endoscopy;  Laterality: N/A;  . FOOT SURGERY     x 2  . MELANOMA EXCISION Left 2000  . PALATOPLASTY N/A 04/08/2015   Procedure: PALATOPLASTY;  Surgeon: Beverly Gust, MD;  Location: [facility] ORS;  Service: ENT;  Laterality: N/A;  . TONSILLECTOMY N/A 04/08/2015   Procedure: TONSILLECTOMY;  Surgeon: Beverly Gust, MD;  Location: [facility] ORS;  Service: ENT;  Laterality: N/A;  . TUBAL LIGATION      Prior to Admission medications   Medication Sig Start Date End Date Taking? Authorizing Provider  amLODipine (NORVASC) 5 MG tablet Take 1 tablet (5 mg total) by mouth daily. MUST KEEP APPT IN January FOR FURTHER REFILLS 05/17/16  Yes [name], MD  b complex vitamins tablet Take 1 tablet by mouth daily.   Yes [provider]  Cholecalciferol 1000 UNITS tablet Take 1,000 Units by mouth 2 (two) times daily. 11/02/10  Yes [provider]  citalopram  (CELEXA) 20 MG tablet TAKE ONE TABLET EVERY DAY 06/27/17  Yes [name], MD  Melatonin 3 MG CAPS Take 3 mg by mouth at bedtime.   Yes [provider]  metoprolol succinate (TOPROL-XL) 50 MG 24 hr tablet TAKE ONE TABLET BY MOUTH EVERY DAY 06/27/17  Yes [name], MD  omeprazole (PRILOSEC) 40 MG capsule TAKE 1 CAPSULE TWICE DAILY NEEDS APPT FOR MORE REFILLS Patient taking differently: TAKE 1 CAPSULE BY MOUTH EVERY MORNING 12/14/16  Yes [name], MD  rosuvastatin (CRESTOR) 10 MG tablet TAKE ONE TABLET BY MOUTH EVERY DAY 09/22/17  Yes [name], MD  vitamin E 400 UNIT capsule Take 400 Units by mouth daily.   Yes [provider]  dicyclomine (BENTYL) 10 MG capsule TAKE 1 CAPSULE BY MOUTH 4 TIMES DAILY BEFORE MEALS AND AT BEDTIME Patient not taking: Reported on 09/07/2017 08/25/17   [name], MD  mirabegron ER (MYRBETRIQ) 25 MG TB24 tablet Take 1 tablet (25 mg total) by mouth daily. 02/06/17   [name], MD    Allergies as of 09/08/2017 - Review Complete 09/07/2017  Allergen Reaction Noted  . Morphine and related Other (See Comments) 07/05/2017  . Tramadol Other (See Comments) 09/25/2014  . Tape Rash 09/25/2014    Family History  Problem Relation Age of Onset  . Cancer Sister 16       ovarian ca  .  Breast cancer Neg Hx     Social History   Socioeconomic History  . Marital status: Married    Spouse name: Not on file  . Number of children: Not on file  . Years of education: 47 , PhD  . Highest education level: Not on file  Occupational History  . Occupation: Optometrist  Social Needs  . Financial resource strain: Not on file  . Food insecurity:    Worry: Not on file    Inability: Not on file  . Transportation needs:    Medical: Not on file    Non-medical: Not on file  Tobacco Use  . Smoking status: Never Smoker  . Smokeless tobacco: Never Used  Substance and Sexual Activity  . Alcohol use: Yes    Alcohol/week: 0.0 oz    Comment:  occ  . Drug use: No  . Sexual activity: Not on file  Lifestyle  . Physical activity:    Days per week: Not on file    Minutes per session: Not on file  . Stress: Not on file  Relationships  . Social connections:    Talks on phone: Not on file    Gets together: Not on file    Attends religious service: Not on file    Active member of club or organization: Not on file    Attends meetings of clubs or organizations: Not on file    Relationship status: Not on file  . Intimate partner violence:    Fear of current or ex partner: Not on file    Emotionally abused: Not on file    Physically abused: Not on file    Forced sexual activity: Not on file  Other Topics Concern  . Not on file  Social History Narrative  . Not on file    Review of Systems: See HPI, otherwise negative ROS  Physical Exam: BP (!) 132/104   Pulse (!) 44   Temp (!) 96.9 F (36.1 C) (Tympanic)   Resp 18   Ht 5\' 3"  (1.6 m)   Wt 158 lb (71.7 kg)   SpO2 97%   BMI 27.99 kg/m  General:   Alert,  pleasant and cooperative in NAD Head:  Normocephalic and atraumatic. Neck:  Supple; no masses or thyromegaly. Lungs:  Clear throughout to auscultation.    Heart:  Regular rate and rhythm. Abdomen:  Soft, nontender and nondistended. Normal bowel sounds, without guarding, and without rebound.   Neurologic:  Alert and  oriented x4;  grossly normal neurologically.  Impression/Plan: Kathy Long is here for an ERCP to be performed for stent removal  Risks, benefits, limitations, and alternatives regarding  ERCP have been reviewed with the patient.  Questions have been answered.  All parties agreeable.   Lucilla Lame, MD  10/04/2017, 11:04 AM

## 2017-10-04 NOTE — Op Note (Signed)
[facility] Patient Name: Kathy Long Procedure Date: 10/04/2017 10:47 AM MRN: [id number] Account #: [id number] Date of Birth: 08/01/1950 Admit Type: Outpatient Age: 67 Room: [facility] ENDO ROOM 4 Gender: Female Note Status: Finalized Procedure:            ERCP Indications:          Stent removal Providers:            Lucilla Lame MD, MD Referring MD:         Deborra Medina, MD (Referring MD) Medicines:            Propofol per Anesthesia Complications:        No immediate complications. Procedure:            Pre-Anesthesia Assessment:                       - Prior to the procedure, a History and Physical was                        performed, and patient medications and allergies were                        reviewed. The patient's tolerance of previous                        anesthesia was also reviewed. The risks and benefits of                        the procedure and the sedation options and risks were                        discussed with the patient. All questions were                        answered, and informed consent was obtained. Prior                        Anticoagulants: The patient has taken no previous                        anticoagulant or antiplatelet agents. ASA Grade                        Assessment: II - A patient with mild systemic disease.                        After reviewing the risks and benefits, the patient was                        deemed in satisfactory condition to undergo the                        procedure.                       After obtaining informed consent, the scope was passed                        under direct vision. Throughout the procedure, the  patient's blood pressure, pulse, and oxygen saturations                        were monitored continuously. The Endoscope was                        introduced through the mouth, and used to inject                        contrast into and used  to inject contrast into the bile                        duct. The ERCP was accomplished without difficulty. The                        patient tolerated the procedure well. Findings:      A biliary stent was visible on the scout film. One plastic stent       originating in the biliary tree was emerging from the major papilla. One       stent was removed from the biliary tree using a snare. The bile duct was       deeply cannulated with the 15 mm balloon. Contrast was injected. I       personally interpreted the bile duct images. There was brisk flow of       contrast through the ducts. Image quality was excellent. Contrast       extended to the hepatic ducts. To discover objects, the biliary tree was       swept with a 15 mm balloon starting at the bifurcation. Nothing was       found. Impression:           - One stent from the biliary tree was seen in the major                        papilla.                       - One stent was removed from the biliary tree.                       - The biliary tree was swept and nothing was found. Recommendation:       - Watch for pancreatitis, bleeding, perforation, and                        cholangitis.                       - Discharge patient to home.                       - Resume previous diet.                       - Continue present medications. Procedure Code(s):    --- Professional ---                       [phone], Endoscopic retrograde cholangiopancreatography                        (ERCP); with removal of foreign body(s) or stent(s)  from biliary/pancreatic duct(s)                       [phone], Endoscopic catheterization of the biliary ductal                        system, radiological supervision and interpretation Diagnosis Code(s):    --- Professional ---                       Z46.59, Encounter for fitting and adjustment of other                        gastrointestinal appliance and device CPT copyright 2017  American Medical Association. All rights reserved. The codes documented in this report are preliminary and upon coder review may  be revised to meet current compliance requirements. Lucilla Lame MD, MD 10/04/2017 11:11:48 AM This report has been signed electronically. Number of Addenda: 0 Note Initiated On: 10/04/2017 10:47 AM      [facility]

## 2017-10-04 NOTE — Anesthesia Procedure Notes (Signed)
Performed by: Eilene Voigt, CRNA Pre-anesthesia Checklist: Patient identified, Emergency Drugs available, Suction available, Patient being monitored and Timeout performed Patient Re-evaluated:Patient Re-evaluated prior to induction Oxygen Delivery Method: Nasal cannula Induction Type: IV induction       

## 2017-10-04 NOTE — Anesthesia Post-op Follow-up Note (Signed)
Anesthesia QCDR form completed.        

## 2017-10-04 NOTE — Anesthesia Postprocedure Evaluation (Signed)
Anesthesia Post Note  Patient: Kathy Long  Procedure(s) Performed: ENDOSCOPIC RETROGRADE CHOLANGIOPANCREATOGRAPHY (ERCP) (N/A )  Patient location during evaluation: Endoscopy Anesthesia Type: General Level of consciousness: awake and alert Pain management: pain level controlled Vital Signs Assessment: post-procedure vital signs reviewed and stable Respiratory status: spontaneous breathing, nonlabored ventilation, respiratory function stable and patient connected to nasal cannula oxygen Cardiovascular status: blood pressure returned to baseline and stable Postop Assessment: no apparent nausea or vomiting Anesthetic complications: no     Last Vitals:  Vitals:   10/04/17 1120 10/04/17 1130  BP: 108/64 123/72  Pulse: (!) 44 (!) 48  Resp: 14 16  Temp:    SpO2: 98% 95%    Last Pain:  Vitals:   10/04/17 1140  TempSrc:   PainSc: 0-No pain                 Martha Clan

## 2017-10-05 ENCOUNTER — Encounter: Payer: Self-pay | Admitting: Gastroenterology

## 2017-11-10 ENCOUNTER — Other Ambulatory Visit: Payer: Self-pay | Admitting: Internal Medicine

## 2017-11-22 ENCOUNTER — Other Ambulatory Visit: Payer: Self-pay

## 2017-11-22 ENCOUNTER — Observation Stay
Admission: EM | Admit: 2017-11-22 | Discharge: 2017-11-23 | Disposition: A | Discharge status: Home / Self Care | Payer: Medicare Other | Attending: Family Medicine | Admitting: Family Medicine

## 2017-11-22 DIAGNOSIS — I10 Essential (primary) hypertension: Secondary | ICD-10-CM | POA: Diagnosis not present

## 2017-11-22 DIAGNOSIS — W010XXA Fall on same level from slipping, tripping and stumbling without subsequent striking against object, initial encounter: Secondary | ICD-10-CM | POA: Diagnosis not present

## 2017-11-22 DIAGNOSIS — Z792 Long term (current) use of antibiotics: Secondary | ICD-10-CM | POA: Diagnosis not present

## 2017-11-22 DIAGNOSIS — N179 Acute kidney failure, unspecified: Secondary | ICD-10-CM | POA: Diagnosis not present

## 2017-11-22 DIAGNOSIS — A080 Rotaviral enteritis: Secondary | ICD-10-CM | POA: Diagnosis not present

## 2017-11-22 DIAGNOSIS — S82402A Unspecified fracture of shaft of left fibula, initial encounter for closed fracture: Secondary | ICD-10-CM | POA: Insufficient documentation

## 2017-11-22 DIAGNOSIS — E785 Hyperlipidemia, unspecified: Secondary | ICD-10-CM | POA: Insufficient documentation

## 2017-11-22 DIAGNOSIS — Z85820 Personal history of malignant melanoma of skin: Secondary | ICD-10-CM | POA: Diagnosis not present

## 2017-11-22 DIAGNOSIS — F329 Major depressive disorder, single episode, unspecified: Secondary | ICD-10-CM | POA: Diagnosis not present

## 2017-11-22 DIAGNOSIS — S82202A Unspecified fracture of shaft of left tibia, initial encounter for closed fracture: Secondary | ICD-10-CM | POA: Insufficient documentation

## 2017-11-22 DIAGNOSIS — Y92002 Bathroom of unspecified non-institutional (private) residence single-family (private) house as the place of occurrence of the external cause: Secondary | ICD-10-CM | POA: Diagnosis not present

## 2017-11-22 DIAGNOSIS — Z8041 Family history of malignant neoplasm of ovary: Secondary | ICD-10-CM | POA: Insufficient documentation

## 2017-11-22 DIAGNOSIS — Z885 Allergy status to narcotic agent status: Secondary | ICD-10-CM | POA: Insufficient documentation

## 2017-11-22 DIAGNOSIS — I959 Hypotension, unspecified: Secondary | ICD-10-CM | POA: Insufficient documentation

## 2017-11-22 DIAGNOSIS — Z803 Family history of malignant neoplasm of breast: Secondary | ICD-10-CM | POA: Diagnosis not present

## 2017-11-22 DIAGNOSIS — Z9049 Acquired absence of other specified parts of digestive tract: Secondary | ICD-10-CM | POA: Insufficient documentation

## 2017-11-22 DIAGNOSIS — Z9851 Tubal ligation status: Secondary | ICD-10-CM | POA: Diagnosis not present

## 2017-11-22 DIAGNOSIS — Z91048 Other nonmedicinal substance allergy status: Secondary | ICD-10-CM | POA: Insufficient documentation

## 2017-11-22 DIAGNOSIS — R197 Diarrhea, unspecified: Secondary | ICD-10-CM | POA: Diagnosis present

## 2017-11-22 LAB — COMPREHENSIVE METABOLIC PANEL
ALT: 28 U/L (ref 0–44)
AST: 34 U/L (ref 15–41)
Albumin: 4.3 g/dL (ref 3.5–5.0)
Alkaline Phosphatase: 79 U/L (ref 38–126)
Anion gap: 13 (ref 5–15)
BUN: 44 mg/dL — ABNORMAL HIGH (ref 8–23)
CO2: 18 mmol/L — ABNORMAL LOW (ref 22–32)
Calcium: 9.2 mg/dL (ref 8.9–10.3)
Chloride: 106 mmol/L (ref 98–111)
Creatinine, Ser: 2.6 mg/dL — ABNORMAL HIGH (ref 0.44–1.00)
GFR calc Af Amer: 20 mL/min — ABNORMAL LOW (ref >60)
GFR calc non Af Amer: 18 mL/min — ABNORMAL LOW (ref >60)
Glucose, Bld: 103 mg/dL — ABNORMAL HIGH (ref 70–99)
Potassium: 3.3 mmol/L — ABNORMAL LOW (ref 3.5–5.1)
Sodium: 137 mmol/L (ref 135–145)
Total Bilirubin: 1.1 mg/dL (ref 0.3–1.2)
Total Protein: 7.7 g/dL (ref 6.5–8.1)

## 2017-11-22 LAB — CBC
HCT: 44.2 % (ref 35.0–47.0)
Hemoglobin: 14.9 g/dL (ref 12.0–16.0)
MCH: 30.2 pg (ref 26.0–34.0)
MCHC: 33.7 g/dL (ref 32.0–36.0)
MCV: 89.6 fL (ref 80.0–100.0)
Platelets: 221 10*3/uL (ref 150–440)
RBC: 4.93 MIL/uL (ref 3.80–5.20)
RDW: 13.6 % (ref 11.5–14.5)
WBC: 6.9 10*3/uL (ref 3.6–11.0)

## 2017-11-22 LAB — URINALYSIS, COMPLETE (UACMP) WITH MICROSCOPIC
Bilirubin Urine: NEGATIVE
Glucose, UA: NEGATIVE mg/dL
Hgb urine dipstick: NEGATIVE
Ketones, ur: NEGATIVE mg/dL
Leukocytes, UA: NEGATIVE
Nitrite: NEGATIVE
Protein, ur: 30 mg/dL — AB
Specific Gravity, Urine: 1.018 (ref 1.005–1.030)
pH: 5 (ref 5.0–8.0)

## 2017-11-22 LAB — C DIFFICILE QUICK SCREEN W PCR REFLEX
C Diff antigen: NEGATIVE
C Diff interpretation: NOT DETECTED
C Diff toxin: NEGATIVE

## 2017-11-22 LAB — LIPASE, BLOOD: Lipase: 43 U/L (ref 11–51)

## 2017-11-22 LAB — MAGNESIUM: Magnesium: 2.1 mg/dL (ref 1.7–2.4)

## 2017-11-22 MED ORDER — ACETAMINOPHEN 325 MG PO TABS
650.0000 mg | ORAL_TABLET | Freq: Four times a day (QID) | ORAL | Status: DC | PRN
  Administered 2017-11-22 – 2017-11-23 (×2): 650 mg via ORAL
  Filled 2017-11-22 (×2): qty 2

## 2017-11-22 MED ORDER — VITAMIN D3 25 MCG (1000 UNIT) PO TABS
1000.0000 [IU] | ORAL_TABLET | Freq: Two times a day (BID) | ORAL | Status: DC
  Administered 2017-11-22 – 2017-11-23 (×2): 1000 [IU] via ORAL
  Filled 2017-11-22 (×4): qty 1

## 2017-11-22 MED ORDER — MIRABEGRON ER 25 MG PO TB24
25.0000 mg | ORAL_TABLET | Freq: Every day | ORAL | Status: DC
  Administered 2017-11-23: 25 mg via ORAL
  Filled 2017-11-22: qty 1

## 2017-11-22 MED ORDER — ACETAMINOPHEN 650 MG RE SUPP: 650.0000 mg | Freq: Four times a day (QID) | RECTAL | Status: DC | PRN

## 2017-11-22 MED ORDER — MELATONIN 5 MG PO TABS
2.5000 mg | ORAL_TABLET | Freq: Every day | ORAL | Status: DC
  Administered 2017-11-22: 2.5 mg via ORAL
  Filled 2017-11-22 (×2): qty 0.5

## 2017-11-22 MED ORDER — SODIUM CHLORIDE 0.9 % IV BOLUS
1000.0000 mL | Freq: Once | INTRAVENOUS | Status: AC
  Administered 2017-11-22: 1000 mL via INTRAVENOUS

## 2017-11-22 MED ORDER — VITAMIN E 180 MG (400 UNIT) PO CAPS
400.0000 [IU] | ORAL_CAPSULE | Freq: Every day | ORAL | Status: DC
  Administered 2017-11-23: 400 [IU] via ORAL
  Filled 2017-11-22: qty 1

## 2017-11-22 MED ORDER — ONDANSETRON 4 MG PO TBDP: 4.0000 mg | ORAL_TABLET | Freq: Three times a day (TID) | ORAL | Status: DC | PRN

## 2017-11-22 MED ORDER — SODIUM CHLORIDE 0.9 % IV SOLN
INTRAVENOUS | Status: DC
  Administered 2017-11-22 – 2017-11-23 (×3): via INTRAVENOUS

## 2017-11-22 MED ORDER — ROSUVASTATIN CALCIUM 10 MG PO TABS
10.0000 mg | ORAL_TABLET | Freq: Every day | ORAL | Status: DC
  Administered 2017-11-23: 10 mg via ORAL
  Filled 2017-11-22: qty 1

## 2017-11-22 MED ORDER — ENOXAPARIN SODIUM 40 MG/0.4ML ~~LOC~~ SOLN
40.0000 mg | SUBCUTANEOUS | Status: DC
  Administered 2017-11-22: 40 mg via SUBCUTANEOUS
  Filled 2017-11-22: qty 0.4

## 2017-11-22 MED ORDER — ONDANSETRON HCL 4 MG/2ML IJ SOLN: 4.0000 mg | Freq: Four times a day (QID) | INTRAMUSCULAR | Status: DC | PRN

## 2017-11-22 MED ORDER — B COMPLEX-C PO TABS
1.0000 | ORAL_TABLET | Freq: Every day | ORAL | Status: DC
  Administered 2017-11-23: 1 via ORAL
  Filled 2017-11-22: qty 1

## 2017-11-22 MED ORDER — B COMPLEX PO TABS: 1.0000 | ORAL_TABLET | Freq: Every day | ORAL | Status: DC

## 2017-11-22 MED ORDER — MELATONIN 5 MG PO CAPS
2.5000 mg | ORAL_CAPSULE | Freq: Every day | ORAL | Status: DC
  Filled 2017-11-22 (×2): qty 1

## 2017-11-22 MED ORDER — ONDANSETRON HCL 4 MG PO TABS: 4.0000 mg | ORAL_TABLET | Freq: Four times a day (QID) | ORAL | Status: DC | PRN

## 2017-11-22 MED ORDER — CITALOPRAM HYDROBROMIDE 20 MG PO TABS
20.0000 mg | ORAL_TABLET | Freq: Every day | ORAL | Status: DC
  Administered 2017-11-23: 20 mg via ORAL
  Filled 2017-11-22: qty 1

## 2017-11-22 MED ORDER — MELATONIN 3 MG PO CAPS: 3.0000 mg | ORAL_CAPSULE | Freq: Every day | ORAL | Status: DC

## 2017-11-22 MED ORDER — HEPARIN SODIUM (PORCINE) 5000 UNIT/ML IJ SOLN: 5000.0000 [IU] | Freq: Three times a day (TID) | INTRAMUSCULAR | Status: DC

## 2017-11-22 NOTE — Progress Notes (Signed)
lovenox changed to heparin subq due to acute renal failure with a crcl approaching <30 ml/min  Ramond Dial, Pharm.D, BCPS Clinical Pharmacist

## 2017-11-22 NOTE — ED Provider Notes (Signed)
[facility] Emergency Department Provider Note   ____________________________________________   First MD Initiated Contact with Patient 11/22/17 1103     (approximate)  I have reviewed the triage vital signs and the nursing notes.   HISTORY  Chief Complaint Diarrhea and Abnormal Lab   HPI Kathy Long is a 67 y.o. female patient reports she went to the beach got back on Saturday had dinner and then early "Sunday morning began having nausea vomiting diarrhea.  Patient's vomiting got better after Zofran prescription but the diarrhea has continued.  Yesterday patient went to urgent care and had blood drawn because on the way to the toilet she broke her ankle one time.  They called her today because she has a elevated creatinine.  She is still having diarrhea in spite of being put on Cipro yesterday.  She has had 3 or 4 doses of Cipro so far.  The frequency of the diarrhea has decreased.  Initially the diarrhea was black but it is now brown.  Patient's creatinine has tripled.  It was 0.8 and is now 2.6.   Past Medical History:  Diagnosis Date  . Acoustic neuroma (HCC)    left ear  . Cancer (HCC)    melanoma, shoulder  . Depression   . Hyperlipidemia   . Hypertension     Patient Active Problem List   Diagnosis Date Noted  . Fitting and adjustment of gastrointestinal appliance and device   . 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 Axillary mass, right 08/06/2017  . S/P laparoscopic cholecystectomy 07/19/2017  . Hospital discharge follow-up 07/19/2017  . Postoperative bile leak   . Dehydration 07/08/2017  . Nausea 07/03/2017  . Unilateral vestibular schwannoma (HCC) 06/12/2017  . Colon cancer screening 03/08/2017  . Nocturia 12/09/2016  . Major depressive disorder, 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HCC) 06/05/2016  . Encounter for preventive health examination 03/07/2015  . Incisional hernia, without obstruction or gangrene 09/28/2014  . Obesity 09/28/2014  .  Subacromial bursitis 09/26/2014  . Anxiety state 11/19/2013  . Gastro-esophageal reflux disease without esophagitis 11/19/2013  . Abnormal results of kidney function studies 09/06/2012  . Squamous cell carcinoma of skin of face 10/21/2011  . Benign neoplasm of cranial nerve (HCC) 09/30/2011  . 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  . Melanoma (HCC) 11/02/2010  . Hyperlipidemia LDL goal <130 04/10/2010  . Essential hypertension 04/10/2010  . Osteopenia 04/10/2010    Past Surgical History:  Procedure Laterality Date  . APPENDECTOMY  2010  . BACK SURGERY     disectomy lumbar, scar tissue  . BREAST CYST EXCISION Right 1970   axilla  . CHOLECYSTECTOMY N/A 07/05/2017   Procedure: LAPAROSCOPIC CHOLECYSTECTOMY WITH INTRAOPERATIVE CHOLANGIOGRAM;  Surgeon: Byrnett, Jeffrey W, MD;  Location: [facility] ORS;  Service: General;  Laterality: N/A;  . COLONOSCOPY     20" 09 and color gaurd in 2018  . ERCP N/A 07/12/2017   Procedure: ENDOSCOPIC RETROGRADE CHOLANGIOPANCREATOGRAPHY (ERCP);  Surgeon: Lucilla Lame, MD;  Location: [facility] ENDOSCOPY;  Service: Endoscopy;  Laterality: N/A;  . ERCP N/A 10/04/2017   Procedure: ENDOSCOPIC RETROGRADE CHOLANGIOPANCREATOGRAPHY (ERCP);  Surgeon: Lucilla Lame, MD;  Location: [facility] ENDOSCOPY;  Service: Endoscopy;  Laterality: N/A;  . FOOT SURGERY     x 2  . MELANOMA EXCISION Left 2000  . PALATOPLASTY N/A 04/08/2015   Procedure: PALATOPLASTY;  Surgeon: Beverly Gust, MD;  Location: [facility] ORS;  Service: ENT;  Laterality: N/A;  . TONSILLECTOMY N/A 04/08/2015   Procedure: TONSILLECTOMY;  Surgeon: Beverly Gust, MD;  Location[facility]  ORS;  Service: ENT;  Laterality: N/A;  . TUBAL LIGATION      Prior to Admission medications   Medication Sig Start Date End Date Taking? Authorizing Provider  amLODipine (NORVASC) 5 MG tablet Take 1 tablet (5 mg total) by mouth daily. MUST KEEP APPT IN January FOR FURTHER REFILLS 05/17/16   [name], MD  b complex vitamins tablet Take 1  tablet by mouth daily.    [provider]  Cholecalciferol 1000 UNITS tablet Take 1,000 Units by mouth 2 (two) times daily. 11/02/10   [provider]  citalopram (CELEXA) 20 MG tablet TAKE ONE TABLET EVERY DAY 06/27/17   [name], MD  dicyclomine (BENTYL) 10 MG capsule TAKE 1 CAPSULE BY MOUTH 4 TIMES DAILY BEFORE MEALS AND AT BEDTIME Patient not taking: Reported on 09/07/2017 08/25/17   [name], MD  Melatonin 3 MG CAPS Take 3 mg by mouth at bedtime.    [provider]  metoprolol succinate (TOPROL-XL) 50 MG 24 hr tablet TAKE ONE TABLET BY MOUTH EVERY DAY 06/27/17   [name], MD  MYRBETRIQ 25 MG TB24 tablet TAKE ONE TABLET EVERY DAY 11/11/17   [name], MD  omeprazole (PRILOSEC) 40 MG capsule TAKE 1 CAPSULE TWICE DAILY NEEDS APPT FOR MORE REFILLS Patient taking differently: TAKE 1 CAPSULE BY MOUTH EVERY MORNING 12/14/16   [name], MD  rosuvastatin (CRESTOR) 10 MG tablet TAKE ONE TABLET BY MOUTH EVERY DAY 09/22/17   [name], MD  vitamin E 400 UNIT capsule Take 400 Units by mouth daily.    [provider]    Allergies Morphine and related; Tramadol; and Tape  Family History  Problem Relation Age of Onset  . Cancer Sister 94       ovarian ca  . Breast cancer Neg Hx     Social History Social History   Tobacco Use  . Smoking status: Never Smoker  . Smokeless tobacco: Never Used  Substance Use Topics  . Alcohol use: Yes    Alcohol/week: 0.0 oz    Comment: occ  . Drug use: No    Review of Systems  Constitutional: No fever/chills Eyes: No visual changes. ENT: No sore throat. Cardiovascular: Denies chest pain. Respiratory: Denies shortness of breath. Gastrointestinal: No abdominal pain.  No nausea, no vomiting.  No diarrhea.  No constipation. Genitourinary: Negative for dysuria. Musculoskeletal: Negative for back pain. Skin: Negative for rash. Neurological: Negative for headaches, focal weakness    ____________________________________________   PHYSICAL EXAM:  VITAL SIGNS: ED Triage Vitals  Enc Vitals Group     BP 11/22/17 1029 (!) 96/54     Pulse Rate 11/22/17 1029 64     Resp 11/22/17 1029 18     Temp 11/22/17 1029 98 F (36.7 C)     Temp Source 11/22/17 1029 Oral     SpO2 11/22/17 1029 100 %     Weight 11/22/17 1030 159 lb (72.1 kg)     Height 11/22/17 1030 5\' 2"  (1.575 m)     Head Circumference --      Peak Flow --      Pain Score 11/22/17 1029 2     Pain Loc --      Pain Edu? --      Excl. in [place]? --     Constitutional: Alert and oriented. Well appearing and in no acute distress. Eyes: Conjunctivae are normal. PER.  Head: Atraumatic. Nose: No congestion/rhinnorhea. Mouth/Throat: Mucous membranes  are moist.  Oropharynx non-erythematous. Neck: No stridor.  Cardiovascular: Normal rate, regular rhythm. Grossly normal heart sounds.  Good peripheral circulation. Respiratory: Normal respiratory effort.  No retractions. Lungs CTAB. Gastrointestinal: Soft and nontender. No distention. No abdominal bruits. No CVA tenderness. Musculoskeletal: Left lower leg is in a walking boot right has no edema no joint effusions. Neurologic:  Normal speech and language. No gross focal neurologic deficits are appreciated. Skin:  Skin is warm, dry and intact. No rash noted. Psychiatric: Mood and affect are normal. Speech and behavior are normal.  ____________________________________________   LABS (all labs ordered are listed, but only abnormal results are displayed)  Labs Reviewed  COMPREHENSIVE METABOLIC PANEL - Abnormal; Notable for the following components:      Result Value   Potassium 3.3 (*)    CO2 18 (*)    Glucose, Bld 103 (*)    BUN 44 (*)    Creatinine, Ser 2.60 (*)    GFR calc non Af Amer 18 (*)    GFR calc Af Amer 20 (*)    All other components within normal limits  GASTROINTESTINAL PANEL BY PCR, STOOL (REPLACES STOOL CULTURE)  C DIFFICILE QUICK SCREEN W PCR  REFLEX  LIPASE, BLOOD  CBC  URINALYSIS, COMPLETE (UACMP) WITH MICROSCOPIC   ____________________________________________  EKG   ____________________________________________  RADIOLOGY  ED MD interpretation:    Official radiology report(s): No results found.  ____________________________________________   PROCEDURES  Procedure(s) performed:   Procedures  Critical Care performed:   ____________________________________________   INITIAL IMPRESSION / ASSESSMENT AND PLAN / ED COURSE  Patient is getting IV fluids her blood pressures come up somewhat from the 96 systolic that it was in the triage room.  Is now 123.  However the patient's creatinine has really gotten considerably worse.  We will collect a stool specimen continue the Cipro and give her IV fluids plan on admitting her to the floor for acute kidney injury.         ____________________________________________   FINAL CLINICAL IMPRESSION(S) / ED DIAGNOSES  Final diagnoses:  AKI (acute kidney injury) ([place])  Diarrhea, unspecified type     ED Discharge Orders    None       Note:  This document was prepared using Dragon voice recognition software and may include unintentional dictation errors.    Nena Polio, MD 11/22/17 1121

## 2017-11-22 NOTE — H&P (Signed)
[facility] NAME: Kathy Long    MR#:  [id number]  DATE OF BIRTH:  [birth year]  DATE OF ADMISSION:  11/22/2017  PRIMARY CARE PHYSICIAN: [name], MD   REQUESTING/REFERRING PHYSICIAN: dr Cinda Quest  CHIEF COMPLAINT:   Nausea vomiting and diarrhea since Saturday.  HISTORY OF PRESENT ILLNESS:  Kathy Long  is a 67 y.o. female with a known history of hypertension, hyperlipidemia and depression comes in the emergency room after she returned from her trip to [facility]. Patient had some cold slaw at a seafood restaurant. She said her husband felt the taste was not that good however she continued to eat and had an extra portion of her husband's tube. Went home next he started having nausea significant vomiting and diarrhea. She took some Pepto-Bismol started having some blackish clouds stools now have decreased in frequency and turn brown. Denies any fever abdominal pain. Had some cramping earlier. Denies any blood in stool. She was taking some Cipro given to her by urgent care. She was called in to say that her creatinine was elevated and asked to come to the emergency room. She was walking to the bathroom at home slipped and has left fibula/tibia fracture. She has boot. She is going to follow-up with Dr. Milinda Pointer orthopedic in [place] next Thursday.  Husband in the ER.  PAST MEDICAL HISTORY:   Past Medical History:  Diagnosis Date  . Acoustic neuroma (HCC)    left ear  . Cancer (HCC)    melanoma, shoulder  . Depression   . Hyperlipidemia   . Hypertension     PAST SURGICAL HISTOIRY:   Past Surgical History:  Procedure Laterality Date  . APPENDECTOMY  2010  . BACK SURGERY     disectomy lumbar, scar tissue  . BREAST CYST EXCISION Right 1970   axilla  . CHOLECYSTECTOMY N/A 07/05/2017   Procedure: LAPAROSCOPIC CHOLECYSTECTOMY WITH INTRAOPERATIVE CHOLANGIOGRAM;  Surgeon: Robert Bellow, MD;  Location: [facility] ORS;   Service: General;  Laterality: N/A;  . COLONOSCOPY     2009 and color gaurd in 2018  . ERCP N/A 07/12/2017   Procedure: ENDOSCOPIC RETROGRADE CHOLANGIOPANCREATOGRAPHY (ERCP);  Surgeon: Lucilla Lame, MD;  Location[facility] ENDOSCOPY;  Service: Endoscopy;  Laterality: N/A;  . ERCP N/A 10/04/2017   Procedure: ENDOSCOPIC RETROGRADE CHOLANGIOPANCREATOGRAPHY (ERCP);  Surgeon: Lucilla Lame, MD;  Location: [facility] ENDOSCOPY;  Service: Endoscopy;  Laterality: N/A;  . FOOT SURGERY     x 2  . MELANOMA EXCISION Left 2000  . PALATOPLASTY N/A 04/08/2015   Procedure: PALATOPLASTY;  Surgeon: Beverly Gust, MD;  Location: [facility] ORS;  Service: ENT;  Laterality: N/A;  . TONSILLECTOMY N/A 04/08/2015   Procedure: TONSILLECTOMY;  Surgeon: Beverly Gust, MD;  Location: [facility] ORS;  Service: ENT;  Laterality: N/A;  . TUBAL LIGATION      SOCIAL HISTORY:   Social History   Tobacco Use  . Smoking status: Never Smoker  . Smokeless tobacco: Never Used  Substance Use Topics  . Alcohol use: Yes    Alcohol/week: 0.0 oz    Comment: occ    FAMILY HISTORY:   Family History  Problem Relation Age of Onset  . Cancer Sister 31       ovarian ca  . Breast cancer Neg Hx     DRUG ALLERGIES:   Allergies  Allergen Reactions  . Morphine And Related Other (See Comments)    Chest pain  . Tramadol Other (See Comments)  .  Tape Rash    Other reaction(s): UNKNOWN    REVIEW OF SYSTEMS:  Review of Systems  Constitutional: Negative for chills, fever and weight loss.  HENT: Negative for ear discharge, ear pain and nosebleeds.   Eyes: Negative for blurred vision, pain and discharge.  Respiratory: Negative for sputum production, shortness of breath, wheezing and stridor.   Cardiovascular: Negative for chest pain, palpitations, orthopnea and PND.  Gastrointestinal: Positive for diarrhea, nausea and vomiting. Negative for abdominal pain.  Genitourinary: Negative for frequency and urgency.  Musculoskeletal: Positive for joint  pain. Negative for back pain.  Neurological: Negative for sensory change, speech change, focal weakness and weakness.  Psychiatric/Behavioral: Negative for depression and hallucinations. The patient is not nervous/anxious.      MEDICATIONS AT HOME:   Prior to Admission medications   Medication Sig Start Date End Date Taking? Authorizing Provider  amLODipine (NORVASC) 5 MG tablet Take 1 tablet (5 mg total) by mouth daily. MUST KEEP APPT IN January FOR FURTHER REFILLS 05/17/16  Yes [name], MD  b complex vitamins tablet Take 1 tablet by mouth daily.   Yes [provider]  Cholecalciferol 1000 UNITS tablet Take 1,000 Units by mouth 2 (two) times daily. 11/02/10  Yes [provider]  ciprofloxacin (CIPRO) 500 MG tablet Take 1 tablet by mouth daily. 11/21/17  Yes [provider]  citalopram (CELEXA) 20 MG tablet TAKE ONE TABLET EVERY DAY 06/27/17  Yes [name], MD  Melatonin 3 MG CAPS Take 3 mg by mouth at bedtime.   Yes [provider]  metoprolol succinate (TOPROL-XL) 50 MG 24 hr tablet TAKE ONE TABLET BY MOUTH EVERY DAY 06/27/17  Yes [name], MD  MYRBETRIQ 25 MG TB24 tablet TAKE ONE TABLET EVERY DAY 11/11/17  Yes [name], MD  rosuvastatin (CRESTOR) 10 MG tablet TAKE ONE TABLET BY MOUTH EVERY DAY 09/22/17  Yes [name], MD  vitamin E 400 UNIT capsule Take 400 Units by mouth daily.   Yes [provider]  ondansetron (ZOFRAN-ODT) 4 MG disintegrating tablet Take 1 tablet by mouth every 8 (eight) hours as needed. 11/21/17   [provider]      VITAL SIGNS:  Blood pressure (!) 96/54, pulse 64, temperature 98 F (36.7 C), temperature source Oral, resp. rate 18, height 5\' 2"  (1.575 m), weight 72.1 kg (159 lb), SpO2 100 %.  PHYSICAL EXAMINATION:  GENERAL:  67 y.o.-year-old patient lying in the bed with no acute distress.  EYES: Pupils equal, round, reactive to light and accommodation. No scleral icterus.  Extraocular muscles intact.  HEENT: Head atraumatic, normocephalic. Oropharynx and nasopharynx clear.  NECK:  Supple, no jugular venous distention. No thyroid enlargement, no tenderness.  LUNGS: Normal breath sounds bilaterally, no wheezing, rales,rhonchi or crepitation. No use of accessory muscles of respiration.  CARDIOVASCULAR: S1, S2 normal. No murmurs, rubs, or gallops.  ABDOMEN: Soft, nontender, nondistended. Bowel sounds present. No organomegaly or mass.  EXTREMITIES: No pedal edema, cyanosis, or clubbing. Left foot boot plus NEUROLOGIC: Cranial nerves II through XII are intact. Muscle strength 5/5 in all extremities. Sensation intact. Gait not checked.  PSYCHIATRIC: The patient is alert and oriented x 3.  SKIN: No obvious rash, lesion, or ulcer.   LABORATORY PANEL:   CBC Recent Labs  Lab 11/22/17 1030  WBC 6.9  HGB 14.9  HCT 44.2  PLT 221   ------------------------------------------------------------------------------------------------------------------  Chemistries  Recent Labs  Lab 11/22/17 1030  NA 137  K 3.3*  CL 106  CO2 18*  GLUCOSE 103*  BUN 44*  CREATININE 2.60*  CALCIUM 9.2  AST 34  ALT 28  ALKPHOS 79  BILITOT 1.1   ------------------------------------------------------------------------------------------------------------------  Cardiac Enzymes No results for input(s): TROPONINI in the last 168 hours. ------------------------------------------------------------------------------------------------------------------  RADIOLOGY:  No results found.  EKG:    IMPRESSION AND PLAN:   Albirtha Grinage  is a 67 y.o. female with a known history of hypertension, hyperlipidemia and depression comes in the emergency room after she returned from her trip to [facility]. Patient had some cold slaw at a seafood restaurant. She said her husband felt the taste was not that good however she continued to eat and had an extra portion of her husband's tube. Went home  next he started having nausea significant vomiting and diarrhea.  1. acute renal failure secondary to G.I. losses from suspected food poisoning -clear liquid diet -IV fluids -avoid nephrotoxic's, follow-up metabolic panel -stool for G.I. stool PCR  2.Relative hypotension secondary to renal failure and G.I. Losses. -Hold BP meds -resume when creatinine stable and blood pressure stable  3. Left tibial fibula fracture -patient will follow-up with orthopedic Dr. Milinda Pointer in [place] next Thursday as outpatient  4. DVT prophylaxis subcu Lovenox  Discussed with patient and husband  All the records are reviewed and case discussed with ED provider. Management plans discussed with the patient, family and they are in agreement.  CODE STATUS: full  TOTAL TIME TAKING CARE OF THIS PATIENT: *50* minutes.    Fritzi Mandes M.D on 11/22/2017 at 1:47 PM  Between 7am to 6pm - Pager - [phone]  After 6pm go to [email or web address] - password [email or web address] [facility]  [facility]  Office  [phone]  CC: Primary care physician; [name], MD

## 2017-11-22 NOTE — ED Notes (Signed)
Pt presents today for abnormal labs. Pt was sent here after labs resulted at PCP. Pt husband says Crea and BUN were high. Pt BP was low in triage. Pt is NAD and resting in the bed at this time. BP is 123/73.

## 2017-11-22 NOTE — ED Triage Notes (Signed)
Pt to ER via POV from [facility]. States that urea, abnormal kidney labs. Pt has had diarrhea since Saturday, emesis.  Pt alert and oriented X4, active, cooperative, pt in NAD. RR even and unlabored, color WNL.

## 2017-11-22 NOTE — Progress Notes (Signed)
Family Meeting Note    Today a meeting took place with the pt and husband  The following were discussed:Patient's diagnosis: patient is being admitted with acute renal failure secondary to G.I. losses from suspected food poisoning. Came in with elevated creatinine. She sustained a left tibial fibula fracture. Her prognosis is good. Her dress code status patient wants to be a full code., Patient's progosis:good  Time spent during discussion:Magaret Justo Posey Pronto, MD 17 mins

## 2017-11-23 ENCOUNTER — Telehealth: Payer: Self-pay

## 2017-11-23 DIAGNOSIS — N179 Acute kidney failure, unspecified: Secondary | ICD-10-CM | POA: Diagnosis present

## 2017-11-23 LAB — GASTROINTESTINAL PANEL BY PCR, STOOL (REPLACES STOOL CULTURE)

## 2017-11-23 LAB — BASIC METABOLIC PANEL
Anion gap: 7 (ref 5–15)
BUN: 28 mg/dL — ABNORMAL HIGH (ref 8–23)
CO2: 19 mmol/L — ABNORMAL LOW (ref 22–32)
Calcium: 8.1 mg/dL — ABNORMAL LOW (ref 8.9–10.3)
Chloride: 115 mmol/L — ABNORMAL HIGH (ref 98–111)
Creatinine, Ser: 1.42 mg/dL — ABNORMAL HIGH (ref 0.44–1.00)
GFR calc Af Amer: 43 mL/min — ABNORMAL LOW (ref >60)
GFR calc non Af Amer: 37 mL/min — ABNORMAL LOW (ref >60)
Glucose, Bld: 90 mg/dL (ref 70–99)
Potassium: 3.1 mmol/L — ABNORMAL LOW (ref 3.5–5.1)
Sodium: 141 mmol/L (ref 135–145)

## 2017-11-23 MED ORDER — FLORANEX PO PACK
1.0000 g | PACK | Freq: Three times a day (TID) | ORAL | 0 refills | Status: DC
Start: 2017-11-23 — End: 2018-02-28

## 2017-11-23 MED ORDER — FLORANEX PO PACK
1.0000 g | PACK | Freq: Three times a day (TID) | ORAL | Status: DC
  Administered 2017-11-23: 1 g via ORAL
  Filled 2017-11-23 (×2): qty 1

## 2017-11-23 MED ORDER — POTASSIUM CHLORIDE 20 MEQ PO PACK
40.0000 meq | PACK | Freq: Once | ORAL | Status: AC
  Administered 2017-11-23: 40 meq via ORAL
  Filled 2017-11-23: qty 2

## 2017-11-23 MED ORDER — ONDANSETRON HCL 4 MG PO TABS: 4.0000 mg | ORAL_TABLET | Freq: Four times a day (QID) | ORAL | 0 refills | Status: DC | PRN

## 2017-11-23 NOTE — Discharge Instructions (Signed)

## 2017-11-23 NOTE — Discharge Summary (Signed)
Kathy Long at [place] NAME: Kathy Long    MR#:  [id number]  DATE OF BIRTH:  1950-07-26  DATE OF ADMISSION:  11/22/2017 ADMITTING PHYSICIAN: Kathy Mandes, MD  DATE OF DISCHARGE: No discharge date for patient encounter.  PRIMARY CARE PHYSICIAN: Kathy Mc, MD    ADMISSION DIAGNOSIS:  AKI (acute kidney injury) (Kathy Long) [N17.9] Diarrhea, unspecified type [R19.7]  DISCHARGE DIAGNOSIS:  Active Problems:   Acute renal failure (ARF) (Kathy Long)   SECONDARY DIAGNOSIS:   Past Medical History:  Diagnosis Date  . Acoustic neuroma (HCC)    left ear  . Cancer (HCC)    melanoma, shoulder  . Depression   . Hyperlipidemia   . Hypertension     HOSPITAL COURSE:  Kathy Long  is a 67 y.o. female with a known history of hypertension, hyperlipidemia and depression comes in the emergency room after she returned from her trip to [facility]. Patient had some cold slaw at a seafood restaurant. She said her husband felt the taste was not that good however she continued to eat and had an extra portion of her husband's tube. Went home next he started having nausea significant vomiting and diarrhea.  1. acute renal failure secondary to G.I. losses from acute rotavirus gastroenteritis Marked improvement with IV fluids for rehydration, able to tolerate liquid diet on day of discharge, GI panel noted for rotavirus, placed on Lactinex and antiemetics PRN while in house  2.Relative hypotension secondary to renal failure and G.I. Losses Resolved with IV fluids for rehydration  3. Left tibial fibula fracture will follow-up with orthopedic Dr. Milinda Long in [place] on Thursday as outpatient for further evaluation  DISCHARGE CONDITIONS:   stable  CONSULTS OBTAINED:    DRUG ALLERGIES:   Allergies  Allergen Reactions  . Morphine And Related Other (See Comments)    Chest pain  . Tramadol Other (See Comments)  . Tape Rash    Other reaction(s):  UNKNOWN    DISCHARGE MEDICATIONS:   Allergies as of 11/23/2017      Reactions   Morphine And Related Other (See Comments)   Chest pain   Tramadol Other (See Comments)   Tape Rash   Other reaction(s): UNKNOWN      Medication List    TAKE these medications   amLODipine 5 MG tablet Commonly known as:  NORVASC Take 1 tablet (5 mg total) by mouth daily. MUST KEEP APPT IN January FOR FURTHER REFILLS   b complex vitamins tablet Take 1 tablet by mouth daily.   Cholecalciferol 1000 units tablet Take 1,000 Units by mouth 2 (two) times daily.   ciprofloxacin 500 MG tablet Commonly known as:  CIPRO Take 1 tablet by mouth daily.   citalopram 20 MG tablet Commonly known as:  CELEXA TAKE ONE TABLET EVERY DAY   lactobacillus Pack Take 1 packet (1 g total) by mouth 3 (three) times daily with meals.   Melatonin 3 MG Caps Take 3 mg by mouth at bedtime.   metoprolol succinate 50 MG 24 hr tablet Commonly known as:  TOPROL-XL TAKE ONE TABLET BY MOUTH EVERY DAY   MYRBETRIQ 25 MG Tb24 tablet Generic drug:  mirabegron ER TAKE ONE TABLET EVERY DAY   ondansetron 4 MG disintegrating tablet Commonly known as:  ZOFRAN-ODT Take 1 tablet by mouth every 8 (eight) hours as needed.   ondansetron 4 MG tablet Commonly known as:  ZOFRAN Take 1 tablet (4 mg total) by mouth every 6 (six) hours  as needed for nausea.   rosuvastatin 10 MG tablet Commonly known as:  CRESTOR TAKE ONE TABLET BY MOUTH EVERY DAY   vitamin E 400 UNIT capsule Take 400 Units by mouth daily.        DISCHARGE INSTRUCTIONS:  If you experience worsening of your admission symptoms, develop shortness of breath, life threatening emergency, suicidal or homicidal thoughts you must seek medical attention immediately by calling 911 or calling your MD immediately  if symptoms less severe.  You Must read complete instructions/literature along with all the possible adverse reactions/side effects for all the Medicines you take  and that have been prescribed to you. Take any new Medicines after you have completely understood and accept all the possible adverse reactions/side effects.   Please note  You were cared for by a hospitalist during your hospital stay. If you have any questions about your discharge medications or the care you received while you were in the hospital after you are discharged, you can call the unit and asked to speak with the hospitalist on call if the hospitalist that took care of you is not available. Once you are discharged, your primary care physician will handle any further medical issues. Please note that NO REFILLS for any discharge medications will be authorized once you are discharged, as it is imperative that you return to your primary care physician (or establish a relationship with a primary care physician if you do not have one) for your aftercare needs so that they can reassess your need for medications and monitor your lab values.    Today   CHIEF COMPLAINT:   Chief Complaint  Patient presents with  . Diarrhea  . Abnormal Lab    HISTORY OF PRESENT ILLNESS:  67 y.o. female with a known history of hypertension, hyperlipidemia and depression comes in the emergency room after she returned from her trip to [facility]. Patient had some cold slaw at a seafood restaurant. She said her husband felt the taste was not that good however she continued to eat and had an extra portion of her husband's tube. Went home next he started having nausea significant vomiting and diarrhea. She took some Pepto-Bismol started having some blackish clouds stools now have decreased in frequency and turn brown. Denies any fever abdominal pain. Had some cramping earlier. Denies any blood in stool. She was taking some Cipro given to her by urgent care. She was called in to say that her creatinine was elevated and asked to come to the emergency room. She was walking to the bathroom at home slipped and has left  fibula/tibia fracture. She has boot. She is going to follow-up with Dr. Milinda Long orthopedic in [place] next Thursday.  Husband in the ER.  VITAL SIGNS:  Blood pressure (!) 144/75, pulse (!) 53, temperature 98.2 F (36.8 C), temperature source Oral, resp. rate 20, height 5\' 2"  (1.575 m), weight 72.1 kg (159 lb), SpO2 100 %.  I/O:    Intake/Output Summary (Last 24 hours) at 11/23/2017 1157 Last data filed at 11/23/2017 0532 Gross per 24 hour  Intake 0 ml  Output -  Net 0 ml    PHYSICAL EXAMINATION:  GENERAL:  67 y.o.-year-old patient lying in the bed with no acute distress.  EYES: Pupils equal, round, reactive to light and accommodation. No scleral icterus. Extraocular muscles intact.  HEENT: Head atraumatic, normocephalic. Oropharynx and nasopharynx clear.  NECK:  Supple, no jugular venous distention. No thyroid enlargement, no tenderness.  LUNGS: Normal breath sounds bilaterally,  no wheezing, rales,rhonchi or crepitation. No use of accessory muscles of respiration.  CARDIOVASCULAR: S1, S2 normal. No murmurs, rubs, or gallops.  ABDOMEN: Soft, non-tender, non-distended. Bowel sounds present. No organomegaly or mass.  EXTREMITIES: No pedal edema, cyanosis, or clubbing.  NEUROLOGIC: Cranial nerves II through XII are intact. Muscle strength 5/5 in all extremities. Sensation intact. Gait not checked.  PSYCHIATRIC: The patient is alert and oriented x 3.  SKIN: No obvious rash, lesion, or ulcer.   DATA REVIEW:   CBC Recent Labs  Lab 11/22/17 1030  WBC 6.9  HGB 14.9  HCT 44.2  PLT 221    Chemistries  Recent Labs  Lab 11/22/17 1030 11/23/17 0458  NA 137 141  K 3.3* 3.1*  CL 106 115*  CO2 18* 19*  GLUCOSE 103* 90  BUN 44* 28*  CREATININE 2.60* 1.42*  CALCIUM 9.2 8.1*  MG 2.1  --   AST 34  --   ALT 28  --   ALKPHOS 79  --   BILITOT 1.1  --     Cardiac Enzymes No results for input(s): TROPONINI in the last 168 hours.  Microbiology Results  Results for orders  placed or performed during the hospital encounter of 11/22/17  Gastrointestinal Panel by PCR , Stool     Status: Abnormal   Collection Time: 11/22/17 11:06 AM  Result Value Ref Range Status   Campylobacter species NOT DETECTED NOT DETECTED Final   Plesimonas shigelloides NOT DETECTED NOT DETECTED Final   Salmonella species NOT DETECTED NOT DETECTED Final   Yersinia enterocolitica NOT DETECTED NOT DETECTED Final   Vibrio species NOT DETECTED NOT DETECTED Final   Vibrio cholerae NOT DETECTED NOT DETECTED Final   Enteroaggregative E coli (EAEC) NOT DETECTED NOT DETECTED Final   Enteropathogenic E coli (EPEC) NOT DETECTED NOT DETECTED Final   Enterotoxigenic E coli (ETEC) NOT DETECTED NOT DETECTED Final   Shiga like toxin producing E coli (STEC) NOT DETECTED NOT DETECTED Final   Shigella/Enteroinvasive E coli (EIEC) NOT DETECTED NOT DETECTED Final   Cryptosporidium NOT DETECTED NOT DETECTED Final   Cyclospora cayetanensis NOT DETECTED NOT DETECTED Final   Entamoeba histolytica NOT DETECTED NOT DETECTED Final   Giardia lamblia NOT DETECTED NOT DETECTED Final   Adenovirus F40/41 NOT DETECTED NOT DETECTED Final   Astrovirus NOT DETECTED NOT DETECTED Final   Norovirus GI/GII NOT DETECTED NOT DETECTED Final   Rotavirus A DETECTED (A) NOT DETECTED Final   Sapovirus (I, II, IV, and V) NOT DETECTED NOT DETECTED Final    Comment: Performed at [facility], [address] [place], [place] [address]  C difficile quick scan w PCR reflex     Status: None   Collection Time: 11/22/17 11:06 AM  Result Value Ref Range Status   C Diff antigen NEGATIVE NEGATIVE Final   C Diff toxin NEGATIVE NEGATIVE Final   C Diff interpretation No C. difficile detected.  Final    Comment: Performed at [facility], [address] [place], [place] [address]    RADIOLOGY:  No results found.  EKG:   Orders placed or performed during the hospital encounter of 07/03/17  . EKG 12-Lead  . EKG  12-Lead  . EKG      Management plans discussed with the patient, family and they are in agreement.  CODE STATUS:     Code Status Orders  (From admission, onward)        Start     Ordered   11/22/17  1421  Full code  Continuous     11/22/17 1421    Code Status History    Date Active Date Inactive Code Status Order ID Comments User Context   07/08/2017 1546 07/13/2017 1319 Full Code [id number]  Robert Bellow, MD Inpatient   07/03/2017 2029 07/06/2017 1522 Full Code [id number]  Idelle Crouch, MD Inpatient    Advance Directive Documentation     Most Recent Value  Type of Advance Directive  Living will  Pre-existing out of facility DNR order (yellow form or pink MOST form)  -  "MOST" Form in Place?  -      TOTAL TIME TAKING CARE OF THIS PATIENT: 45 minutes.    Avel Peace Birdie Beveridge M.D on 11/23/2017 at 11:57 AM  Between 7am to 6pm - Pager - [phone]  After 6pm go to [email or web address] - password [email or web address] [facility]  Office  [phone]  CC: Primary care physician; Kathy Mc, MD   Note: This dictation was prepared with Dragon dictation along with smaller phrase technology. Any transcriptional errors that result from this process are unintentional.

## 2017-11-23 NOTE — Telephone Encounter (Signed)
Copied from [place] [id number]. Topic: Appointment Scheduling - Scheduling Inquiry for Clinic >> Nov 23, 2017 12:08 PM Lennox Solders wrote: Reason for CRM: [facility] is calling and pt needs post hospital discharge in 5 day. Pt will be discharge today

## 2017-11-23 NOTE — Care Management CC44 (Signed)
Condition Code 44 Documentation Completed  Patient Details  Name: Kathy Long MRN: [id number] Date of Birth: 02-12-1951   Condition Code 44 given:  Yes Patient signature on Condition Code 44 notice:  Yes Documentation of 2 MD's agreement:  Yes Code 44 added to claim:  Yes    Beverly Sessions, RN 11/23/2017, 12:28 PM

## 2017-11-23 NOTE — Progress Notes (Signed)
Discharge instructions reviewed with patient and husband, including new medications and followup visits.  Understanding was verbalized and all questions were answered.  Patient discharged home via wheelchair in stable condition escorted by nursing staff.

## 2017-11-23 NOTE — Care Management Obs Status (Signed)
[facility] NOTIFICATION   Patient Details  Name: Kathy Long MRN: [id number] Date of Birth: [birth year]   Medicare Observation Status Notification Given:  Yes    Beverly Sessions, RN 11/23/2017, 12:28 PM

## 2017-11-24 ENCOUNTER — Encounter: Payer: Self-pay | Admitting: Podiatry

## 2017-11-24 ENCOUNTER — Ambulatory Visit: Payer: Medicare Other

## 2017-11-24 ENCOUNTER — Ambulatory Visit: Payer: Medicare Other | Admitting: Podiatry

## 2017-11-24 ENCOUNTER — Telehealth: Payer: Self-pay | Admitting: Podiatry

## 2017-11-24 VITALS — BP 127/87 | HR 58 | Resp 16

## 2017-11-24 DIAGNOSIS — S8262XA Displaced fracture of lateral malleolus of left fibula, initial encounter for closed fracture: Secondary | ICD-10-CM

## 2017-11-24 DIAGNOSIS — S9032XA Contusion of left foot, initial encounter: Secondary | ICD-10-CM

## 2017-11-24 DIAGNOSIS — R600 Localized edema: Secondary | ICD-10-CM

## 2017-11-24 NOTE — Telephone Encounter (Signed)
Please advise 

## 2017-11-24 NOTE — Progress Notes (Signed)
Subjective:  Patient ID: Kathy Long, female    DOB: 1950-08-28,  MRN: [id number]  Chief Complaint  Patient presents with  . Foot Injury    Anterior ankle and lateral foot left - patient states she got abruptly sick 5 days ago and got up from bed nauseated, says she ran to the bathroom to vomit but didn't make it and ended up slipping in her own vomit, twisted foot, very bruised and swollen still, went to urgent care-xrayed-said fractured, wearing old boot from having foot surgery here-brought her xrays today  . New Patient (Initial Visit)    Est pt - 3 years+   67 y.o. female presents with the above complaint.  Reports left ankle injury as above.  Brings x-rays with her for review.  Past Medical History:  Diagnosis Date  . Acoustic neuroma (HCC)    left ear  . Cancer (HCC)    melanoma, shoulder  . Depression   . Hyperlipidemia   . Hypertension    Past Surgical History:  Procedure Laterality Date  . APPENDECTOMY  2010  . BACK SURGERY     disectomy lumbar, scar tissue  . BREAST CYST EXCISION Right 1970   axilla  . CHOLECYSTECTOMY N/A 07/05/2017   Procedure: LAPAROSCOPIC CHOLECYSTECTOMY WITH INTRAOPERATIVE CHOLANGIOGRAM;  Surgeon: Robert Bellow, MD;  Location: [facility] ORS;  Service: General;  Laterality: N/A;  . COLONOSCOPY     2009 and color gaurd in 2018  . ERCP N/A 07/12/2017   Procedure: ENDOSCOPIC RETROGRADE CHOLANGIOPANCREATOGRAPHY (ERCP);  Surgeon: Lucilla Lame, MD;  Location[facility];  Service: Endoscopy;  Laterality: N/A;  . ERCP N/A 10/04/2017   Procedure: ENDOSCOPIC RETROGRADE CHOLANGIOPANCREATOGRAPHY (ERCP);  Surgeon: Lucilla Lame, MD;  Location: [facility] ENDOSCOPY;  Service: Endoscopy;  Laterality: N/A;  . FOOT SURGERY     x 2  . MELANOMA EXCISION Left 2000  . PALATOPLASTY N/A 04/08/2015   Procedure: PALATOPLASTY;  Surgeon: Beverly Gust, MD;  Location: [facility] ORS;  Service: ENT;  Laterality: N/A;  . TONSILLECTOMY N/A 04/08/2015   Procedure: TONSILLECTOMY;   Surgeon: Beverly Gust, MD;  Location: [facility] ORS;  Service: ENT;  Laterality: N/A;  . TUBAL LIGATION      Current Outpatient Medications:  .  amLODipine (NORVASC) 5 MG tablet, Take 1 tablet (5 mg total) by mouth daily. MUST KEEP APPT IN January FOR FURTHER REFILLS, Disp: 30 tablet, Rfl: 0 .  b complex vitamins tablet, Take 1 tablet by mouth daily., Disp: , Rfl:  .  Cholecalciferol 1000 UNITS tablet, Take 1,000 Units by mouth 2 (two) times daily., Disp: , Rfl:  .  citalopram (CELEXA) 20 MG tablet, TAKE ONE TABLET EVERY DAY, Disp: 90 tablet, Rfl: 1 .  lactobacillus (FLORANEX/LACTINEX) PACK, Take 1 packet (1 g total) by mouth 3 (three) times daily with meals., Disp: 20 packet, Rfl: 0 .  Melatonin 3 MG CAPS, Take 3 mg by mouth at bedtime., Disp: , Rfl:  .  metoprolol succinate (TOPROL-XL) 50 MG 24 hr tablet, TAKE ONE TABLET BY MOUTH EVERY DAY, Disp: 90 tablet, Rfl: 1 .  MYRBETRIQ 25 MG TB24 tablet, TAKE ONE TABLET EVERY DAY, Disp: 30 tablet, Rfl: 5 .  ondansetron (ZOFRAN) 4 MG tablet, Take 1 tablet (4 mg total) by mouth every 6 (six) hours as needed for nausea., Disp: 10 tablet, Rfl: 0 .  rosuvastatin (CRESTOR) 10 MG tablet, TAKE ONE TABLET BY MOUTH EVERY DAY, Disp: 90 tablet, Rfl: 1 .  vitamin E 400 UNIT capsule, Take 400 Units by  mouth daily., Disp: , Rfl:   Allergies  Allergen Reactions  . Morphine And Related Other (See Comments)    Chest pain  . Tramadol Other (See Comments)  . Tape Rash    Other reaction(s): UNKNOWN   Review of Systems: Negative except as noted in the HPI. Denies N/V/F/Ch. Objective:   Vitals:   11/24/17 1110  BP: 127/87  Pulse: (!) 58  Resp: 16   General AA&O x3. Normal mood and affect.  Vascular Dorsalis pedis and posterior tibial pulses  present 2+ bilaterally  Capillary refill normal to all digits. Pedal hair growth normal. Edema contusion left ankle  Neurologic Epicritic sensation grossly present.  Dermatologic No open lesions. Interspaces clear of  maceration. Nails well groomed and normal in appearance.  Orthopedic: MMT 5/5 in dorsiflexion, plantarflexion, inversion, and eversion. Pain palpation about the left lateral ankle.  No pain in the medial malleolus.   Assessment & Plan:  Patient was evaluated and treated and all questions answered.  Left ankle fracture SER 2 with displacement -Multilayer compression dressing applied left lower extremity.  Cam boot reapplied. -Prior x-rays reviewed.  Shortened and displaced isolated fibular fracture.  Mortise intact -Conservative versus surgical therapy is planned patient. Discussed benefit of operative intervention.  All response concerns of been to patient no guarantees given.  Consent reviewed and signed by patient.  We will plan to perform the procedure next week.  Would benefit from intramedullary nail versus plate and screw fixation.  No follow-ups on file.

## 2017-11-25 NOTE — Telephone Encounter (Signed)
Transition Care Management Follow-up Telephone Call  How have you been since you were released from the hospital? Patient feeling better, diarrhea subsided, patient will be having surgery on Wednesday for closed fracture of distal lateral malleolus of left fibula.   Do you understand why you were in the hospital? yes   Do you understand the discharge instrcutions? yes  Items Reviewed:  Medications reviewed: yes  Allergies reviewed: yes  Dietary changes reviewed: yes  Referrals reviewed: yes   Functional Questionnaire:   Activities of Daily Living (ADLs):   She states they are independent in the following: bathing and hygiene, feeding, continence, grooming, toileting and dressing States they require assistance with the following: ambulation due to fracture.   Any transportation issues/concerns?: no   Any patient concerns? no   Confirmed importance and date/time of follow-up visits scheduled: yes   Confirmed with patient if condition begins to worsen call PCP or go to the ER.  Patient was given the Call-a-Nurse line [phone]: yes

## 2017-11-25 NOTE — Telephone Encounter (Signed)
error 

## 2017-11-28 ENCOUNTER — Ambulatory Visit: Payer: Medicare Other | Admitting: Internal Medicine

## 2017-11-28 ENCOUNTER — Encounter: Payer: Self-pay | Admitting: Internal Medicine

## 2017-11-28 VITALS — BP 128/82 | HR 54 | Temp 98.2°F | Resp 15 | Ht 62.0 in | Wt 168.8 lb

## 2017-11-28 DIAGNOSIS — E785 Hyperlipidemia, unspecified: Secondary | ICD-10-CM

## 2017-11-28 DIAGNOSIS — E876 Hypokalemia: Secondary | ICD-10-CM

## 2017-11-28 DIAGNOSIS — Z09 Encounter for follow-up examination after completed treatment for conditions other than malignant neoplasm: Secondary | ICD-10-CM | POA: Diagnosis not present

## 2017-11-28 DIAGNOSIS — S8262XD Displaced fracture of lateral malleolus of left fibula, subsequent encounter for closed fracture with routine healing: Secondary | ICD-10-CM | POA: Diagnosis not present

## 2017-11-28 DIAGNOSIS — N179 Acute kidney failure, unspecified: Secondary | ICD-10-CM | POA: Diagnosis not present

## 2017-11-28 DIAGNOSIS — S82832D Other fracture of upper and lower end of left fibula, subsequent encounter for closed fracture with routine healing: Secondary | ICD-10-CM

## 2017-11-28 LAB — LIPID PANEL
Cholesterol: 128 mg/dL (ref 0–200)
HDL: 35.8 mg/dL — ABNORMAL LOW (ref >39.00)
LDL Cholesterol: 65 mg/dL (ref 0–99)
NonHDL: 92.59
Total CHOL/HDL Ratio: 4
Triglycerides: 139 mg/dL (ref 0.0–149.0)
VLDL: 27.8 mg/dL (ref 0.0–40.0)

## 2017-11-28 LAB — BASIC METABOLIC PANEL
BUN: 15 mg/dL (ref 6–23)
CO2: 31 mEq/L (ref 19–32)
Calcium: 9.3 mg/dL (ref 8.4–10.5)
Chloride: 107 mEq/L (ref 96–112)
Creatinine, Ser: 0.93 mg/dL (ref 0.40–1.20)
GFR: 63.45 mL/min (ref >60.00)
Glucose, Bld: 99 mg/dL (ref 70–99)
Potassium: 4.9 mEq/L (ref 3.5–5.1)
Sodium: 145 mEq/L (ref 135–145)

## 2017-11-28 MED ORDER — HYDROCODONE-ACETAMINOPHEN 10-325 MG PO TABS: 1.0000 | ORAL_TABLET | Freq: Four times a day (QID) | ORAL | 0 refills | Status: DC | PRN

## 2017-11-28 NOTE — Patient Instructions (Signed)
YOU should use a walker and wheelchair for the first few days while you are recovering from surgery   Do NOT LET yourself get constipated by the Vicodin.  Use sennokot, ex lax or Dulcolax as needed

## 2017-11-28 NOTE — Progress Notes (Signed)
Subjective:  Patient ID: Kathy Long, female    DOB: 12-28-1950  Age: 67 y.o. MRN: [id number]  CC: The primary encounter diagnosis was Acute renal failure, unspecified acute renal failure type ([place]). Diagnoses of Closed displaced fracture of lateral malleolus of left fibula with routine healing, subsequent encounter, Hyperlipidemia LDL goal <130, Hospital discharge follow-up, Hypokalemia, gastrointestinal losses, and Closed fracture of distal end of left fibula with routine healing, unspecified fracture morphology, subsequent encounter were also pertinent to this visit.  HPI Kathy Long presents for hospital follow up.  Admitted to [facility] on June 25 with acute renal failure secondary to diarrhea.  The diarrhea began one day after returning from a beach vacation  In late June.  Treated at Urgent Care,  Labs done,  Noted to be in acute renal failure (cr had increased from normal to  2.6) and she was directed to the ER .   Diarrhea workup positive for rotavirus .  Cr improved overnight from 2.6 to 1.42  but potassium was low on discharge day, which was June 26  and she has been taking an OTC supplement for K of 3.1 .  Mg was normal   Prior to admission she had a fall  In the bathroom due to beeing dehydrated and dizzy and  suffered a displaced left fibular fracture .  She has been seen by Dr. Milinda Pointer and is scheduled for surgical reduction  On July 3 by his colleague Dr March Rummage.    She is antiicpating non weight bearing status for 8 weeks post operatively and has purchased an ankle scooter,  But is apprehensive about being able to manage it during her car trips to various school systems starting in August.    All labs , imaging studies and progress notes from admission were reviewed with patient today    Outpatient Medications Prior to Visit  Medication Sig Dispense Refill  . amLODipine (NORVASC) 5 MG tablet Take 1 tablet (5 mg total) by mouth daily. MUST KEEP APPT IN January FOR FURTHER REFILLS 30  tablet 0  . b complex vitamins tablet Take 1 tablet by mouth daily.    . Cholecalciferol 1000 UNITS tablet Take 1,000 Units by mouth 2 (two) times daily.    . citalopram (CELEXA) 20 MG tablet TAKE ONE TABLET EVERY DAY 90 tablet 1  . lactobacillus (FLORANEX/LACTINEX) PACK Take 1 packet (1 g total) by mouth 3 (three) times daily with meals. 20 packet 0  . Melatonin 3 MG CAPS Take 3 mg by mouth at bedtime.    . metoprolol succinate (TOPROL-XL) 50 MG 24 hr tablet TAKE ONE TABLET BY MOUTH EVERY DAY 90 tablet 1  . MYRBETRIQ 25 MG TB24 tablet TAKE ONE TABLET EVERY DAY 30 tablet 5  . ondansetron (ZOFRAN) 4 MG tablet Take 1 tablet (4 mg total) by mouth every 6 (six) hours as needed for nausea. 10 tablet 0  . rosuvastatin (CRESTOR) 10 MG tablet TAKE ONE TABLET BY MOUTH EVERY DAY 90 tablet 1  . vitamin E 400 UNIT capsule Take 400 Units by mouth daily.     No facility-administered medications prior to visit.     Review of Systems;  Patient denies headache, fevers, malaise, unintentional weight loss, skin rash, eye pain, sinus congestion and sinus pain, sore throat, dysphagia,  hemoptysis , cough, dyspnea, wheezing, chest pain, palpitations, orthopnea, edema, abdominal pain, nausea, melena, diarrhea, constipation, flank pain, dysuria, hematuria, urinary  Frequency, nocturia, numbness, tingling, seizures,  Focal weakness, Loss of  consciousness,  Tremor, insomnia, depression, anxiety, and suicidal ideation.      Objective:  BP 128/82 (BP Location: Left Arm, Patient Position: Sitting, Cuff Size: Normal)   Pulse (!) 54   Temp 98.2 F (36.8 C) (Oral)   Resp 15   Ht 5\' 2"  (1.575 m)   Wt 168 lb 12.8 oz (76.6 kg)   SpO2 98%   BMI 30.87 kg/m   BP Readings from Last 3 Encounters:  11/28/17 128/82  11/24/17 127/87  11/23/17 (!) 144/75    Wt Readings from Last 3 Encounters:  11/28/17 168 lb 12.8 oz (76.6 kg)  11/22/17 159 lb (72.1 kg)  10/04/17 158 lb (71.7 kg)    General appearance: alert,  cooperative and appears stated age Neck: no adenopathy, no carotid bruit, supple, symmetrical, trachea midline and thyroid not enlarged, symmetric, no tenderness/mass/nodules Back: symmetric, no curvature. ROM normal. No CVA tenderness. Lungs: clear to auscultation bilaterally Heart: regular rate and rhythm, S1, S2 normal, no murmur, click, rub or gallop Abdomen: soft, non-tender; bowel sounds normal; no masses,  no organomegaly Pulses: 2+ and symmetric Skin: Skin color, texture, turgor normal. No rashes or lesions Lymph nodes: Cervical, supraclavicular, and axillary nodes normal. MSK: left ankle immobilized in air cast   Lab Results  Component Value Date   HGBA1C 5.8 06/10/2017   HGBA1C 5.7 06/02/2016    Lab Results  Component Value Date   CREATININE 0.93 11/28/2017   CREATININE 1.42 (H) 11/23/2017   CREATININE 2.60 (H) 11/22/2017    Lab Results  Component Value Date   WBC 6.9 11/22/2017   HGB 14.9 11/22/2017   HCT 44.2 11/22/2017   PLT 221 11/22/2017   GLUCOSE 99 11/28/2017   CHOL 128 11/28/2017   TRIG 139.0 11/28/2017   HDL 35.80 (L) 11/28/2017   LDLCALC 65 11/28/2017   ALT 28 11/22/2017   AST 34 11/22/2017   NA 145 11/28/2017   K 4.9 11/28/2017   CL 107 11/28/2017   CREATININE 0.93 11/28/2017   BUN 15 11/28/2017   CO2 31 11/28/2017   TSH 1.50 06/10/2017   INR 0.91 10/23/2009   HGBA1C 5.8 06/10/2017    No results found.  Assessment & Plan:   Problem List Items Addressed This Visit    Hyperlipidemia LDL goal <130   Relevant Orders   Lipid panel (Completed)   Hospital discharge follow-up    Recent admission for acute renal failure secondary to Rot virus, complicated by left fibular fracture.  Patient is stable post discharge and has no new issues or questions about discharge plans at the visit today for hospital follow up. All labs , imaging studies and progress notes from admission were reviewed with patient today  .        Acute renal failure ([place]) -  Primary    Secondary to dehydration from Rotavirus. Cr has improved  from 2.6  And is now normal  Lab Results  Component Value Date   CREATININE 0.93 11/28/2017   Lab Results  Component Value Date   NA 145 11/28/2017   K 4.9 11/28/2017   CL 107 11/28/2017   CO2 31 11/28/2017          Relevant Orders   Basic metabolic panel (Completed)   Hypokalemia, gastrointestinal losses    Noted during admission and present at discharge.  Now resolved with OTC supplements.  Mg was normal during admission  Lab Results  Component Value Date   NA 145 11/28/2017   K 4.9 11/28/2017  CL 107 11/28/2017   CO2 31 11/28/2017         Closed left fibular fracture    Advised to use walker post operatively followed by a scooter that she can manage.       Relevant Orders   For home use only DME 4 wheeled rolling walker with seat ([id number])      I am having Kathy Long "Debbie" start on HYDROcodone-acetaminophen. I am also having her maintain her Cholecalciferol, Melatonin, amLODipine, citalopram, metoprolol succinate, vitamin E, b complex vitamins, rosuvastatin, MYRBETRIQ, lactobacillus, and ondansetron.  Meds ordered this encounter  Medications  . HYDROcodone-acetaminophen (NORCO) 10-325 MG tablet    Sig: Take 1 tablet by mouth every 6 (six) hours as needed.    Dispense:  30 tablet    Refill:  0    There are no discontinued medications.  Follow-up: Return in about 3 months (around 02/28/2018).   [name], MD

## 2017-11-29 ENCOUNTER — Telehealth: Payer: Self-pay

## 2017-11-29 DIAGNOSIS — E876 Hypokalemia: Secondary | ICD-10-CM | POA: Insufficient documentation

## 2017-11-29 DIAGNOSIS — S82402A Unspecified fracture of shaft of left fibula, initial encounter for closed fracture: Secondary | ICD-10-CM | POA: Insufficient documentation

## 2017-11-29 NOTE — Assessment & Plan Note (Signed)
Recent admission for acute renal failure secondary to Rot virus, complicated by left fibular fracture.  Patient is stable post discharge and has no new issues or questions about discharge plans at the visit today for hospital follow up. All labs , imaging studies and progress notes from admission were reviewed with patient today  .

## 2017-11-29 NOTE — Assessment & Plan Note (Signed)
Advised to use walker post operatively followed by a scooter that she can manage.

## 2017-11-29 NOTE — Telephone Encounter (Signed)
EMMI Follow-up: Noted on the report that scheduled follow-up appointment doesn't apply.  I talked with Ms. Marseille and she had a follow-up appointment with Dr. Derrel Nip on 11/28/17. Surgery scheduled for 11/30/17 in [place] with Dr. Windy Kalata, post-op appointment on Monday, 7/8 with Dr. March Rummage, then suture removal and appointment with Dr. Derrel Nip again in October. Wished her well with pending surgery tomorrow.  No other needs noted.

## 2017-11-29 NOTE — Assessment & Plan Note (Signed)
Noted during admission and present at discharge.  Now resolved with OTC supplements.  Mg was normal during admission  Lab Results  Component Value Date   NA 145 11/28/2017   K 4.9 11/28/2017   CL 107 11/28/2017   CO2 31 11/28/2017

## 2017-11-29 NOTE — Assessment & Plan Note (Signed)
Secondary to dehydration from Rotavirus. Cr has improved  from 2.6  And is now normal  Lab Results  Component Value Date   CREATININE 0.93 11/28/2017   Lab Results  Component Value Date   NA 145 11/28/2017   K 4.9 11/28/2017   CL 107 11/28/2017   CO2 31 11/28/2017

## 2017-11-30 ENCOUNTER — Encounter: Payer: Self-pay | Admitting: Podiatry

## 2017-11-30 ENCOUNTER — Other Ambulatory Visit: Payer: Self-pay | Admitting: Podiatry

## 2017-11-30 DIAGNOSIS — S8262XA Displaced fracture of lateral malleolus of left fibula, initial encounter for closed fracture: Secondary | ICD-10-CM | POA: Diagnosis not present

## 2017-11-30 MED ORDER — ONDANSETRON HCL 4 MG PO TABS: 4.0000 mg | ORAL_TABLET | Freq: Three times a day (TID) | ORAL | 0 refills | Status: DC | PRN

## 2017-11-30 MED ORDER — OXYCODONE-ACETAMINOPHEN 10-325 MG PO TABS: 1.0000 | ORAL_TABLET | ORAL | 0 refills | Status: DC | PRN

## 2017-11-30 MED ORDER — CEPHALEXIN 500 MG PO CAPS: 500.0000 mg | ORAL_CAPSULE | Freq: Two times a day (BID) | ORAL | 0 refills | Status: DC

## 2017-12-05 ENCOUNTER — Ambulatory Visit (INDEPENDENT_AMBULATORY_CARE_PROVIDER_SITE_OTHER): Payer: Medicare Other

## 2017-12-05 ENCOUNTER — Ambulatory Visit (INDEPENDENT_AMBULATORY_CARE_PROVIDER_SITE_OTHER): Payer: Medicare Other | Admitting: Podiatry

## 2017-12-05 DIAGNOSIS — S8262XA Displaced fracture of lateral malleolus of left fibula, initial encounter for closed fracture: Secondary | ICD-10-CM

## 2017-12-07 ENCOUNTER — Inpatient Hospital Stay: Payer: Medicare Other | Admitting: Internal Medicine

## 2017-12-16 ENCOUNTER — Other Ambulatory Visit: Payer: Self-pay | Admitting: Internal Medicine

## 2017-12-16 ENCOUNTER — Ambulatory Visit (INDEPENDENT_AMBULATORY_CARE_PROVIDER_SITE_OTHER): Payer: Self-pay | Admitting: Podiatry

## 2017-12-16 ENCOUNTER — Telehealth: Payer: Self-pay | Admitting: *Deleted

## 2017-12-16 ENCOUNTER — Encounter: Payer: Self-pay | Admitting: Podiatry

## 2017-12-16 DIAGNOSIS — S8262XA Displaced fracture of lateral malleolus of left fibula, initial encounter for closed fracture: Secondary | ICD-10-CM

## 2017-12-16 NOTE — Telephone Encounter (Signed)
Faxed required form, clinicals 11/24/2017 and demographics to [facility].

## 2017-12-18 NOTE — Progress Notes (Signed)
  Subjective:  Patient ID: Kathy Long, female    DOB: [birth year],  MRN: [id number]  No chief complaint on file.   DOS: 11/30/17 Procedure: Left Ankle ORIF  67 y.o. female returns for post-op check. Denies N/V/F/Ch. Pain is controlled with current medications.  Objective:   General AA&O x3. Normal mood and affect.  Vascular Foot warm and well perfused.  Neurologic Gross sensation intact.  Dermatologic Skin healing well without signs of infection. Skin edges well coapted without signs of infection.  Orthopedic: Tenderness to palpation noted about the surgical site.    Assessment & Plan:  Patient was evaluated and treated and all questions answered.  S/p left ankle ORIF  -X-rays taken reviewed consistent with postop state -Progressing as expected post-operatively. -Staples: intact. -Medications refilled: none -Foot redressed.  Return in about 1 week (around 12/12/2017) for Post-op, Left.

## 2017-12-18 NOTE — Progress Notes (Signed)
  Subjective:  Patient ID: Kathy Long, female    DOB: [birth year],  MRN: [id number]  Chief Complaint  Patient presents with  . Routine Post Op    dos 07.03.2019 Ankle ORIF Lt w/ Fixation " my foot feels fine, I am not having any pain"     DOS: 11/30/17 Procedure: Left Ankle ORIF  67 y.o. female returns for post-op check. Denies N/V/F/Ch.  Doing well.  Minimal pain.  Objective:   General AA&O x3. Normal mood and affect.  Vascular Foot warm and well perfused.  Neurologic Gross sensation intact.  Dermatologic Skin healing well without signs of infection. Skin edges well coapted without signs of infection.  Orthopedic: Tenderness to palpation noted about the surgical site.    Assessment & Plan:  Patient was evaluated and treated and all questions answered.  S/p left ankle ORIF  -X-rays taken reviewed consistent with postop state -Progressing as expected post-operatively. -Staples: intact. -Medications refilled: none -Foot redressed with well padded dressing.  No follow-ups on file.  Follow-up in 1 week for suture removal

## 2017-12-19 NOTE — Telephone Encounter (Signed)
Last OV 11/28/17 ok to fill Clobetasol?

## 2017-12-20 DIAGNOSIS — Z9181 History of falling: Secondary | ICD-10-CM

## 2017-12-20 DIAGNOSIS — S8262XD Displaced fracture of lateral malleolus of left fibula, subsequent encounter for closed fracture with routine healing: Secondary | ICD-10-CM

## 2017-12-20 DIAGNOSIS — H933X2 Disorders of left acoustic nerve: Secondary | ICD-10-CM | POA: Diagnosis not present

## 2017-12-21 ENCOUNTER — Telehealth: Payer: Self-pay | Admitting: Podiatry

## 2017-12-21 ENCOUNTER — Other Ambulatory Visit: Payer: Self-pay | Admitting: Internal Medicine

## 2017-12-21 NOTE — Telephone Encounter (Signed)
I had surgery with Dr. March Rummage about three weeks ago and he has ordered home health care nursing and PT. I knew about the PT and I totally agreed with that, but I had a nurse come and spend a couple of hours with me yesterday. She said she needed to come three times a week for two weeks and I don't need home health care nursing so I wanted to see if Dr. March Rummage would be willing to change that order and just put an order in for PT and NO home health care nursing. If you would call me back I would appreciate it at [phone]. Thank you.

## 2017-12-21 NOTE — Telephone Encounter (Signed)
This is Kathy Long with [facility] in regards to Ms. App. She has requested that home health nursing services be discontinued. She feels that she only needs physical therapy services. That is supposed to be scheduled by you guys for tomorrow, 25 July. I just wanted to let you know that she is requesting to cancel home health nursing services and keep physical therapy services which will be out to do an evaluation tomorrow. If you have any questions or need to speak to me, you can call me back at [phone]. Thank you.

## 2017-12-22 ENCOUNTER — Telehealth: Payer: Self-pay | Admitting: *Deleted

## 2017-12-22 NOTE — Telephone Encounter (Signed)
Left message informing pt the message that was left did not mention need to contact the pt, it was presented as pt wanted to discontinue the [facility] and I would contact the [facility] and discontinue, and Dr. March Rummage and been informed and okayed. Faxed orders to discontinue home health care to [facility].

## 2017-12-22 NOTE — Telephone Encounter (Signed)
Pt states she has met with the home health care nurse for over 1 1/2 hours for interview, pt states she has also be contacted by a LPN, she does not know why Dr. March Rummage wants her to have in-home nursing and feels their time could be better spent helping someone else.

## 2017-12-22 NOTE — Telephone Encounter (Signed)
That's fine. Thanks!

## 2017-12-23 ENCOUNTER — Other Ambulatory Visit: Payer: Medicare Other

## 2017-12-23 NOTE — Telephone Encounter (Signed)
Pt called and thanked me for the message that the [facility] had be discontinued. I asked pt about her sister's surgery and pt states her sister did good after her heart surgery and was feeling the results of the incision discomfort so they did not stay long to visit.

## 2017-12-23 NOTE — Telephone Encounter (Signed)
She doesn't need to have nursing. But she does need PT

## 2017-12-26 ENCOUNTER — Telehealth: Payer: Self-pay | Admitting: *Deleted

## 2017-12-26 ENCOUNTER — Ambulatory Visit (INDEPENDENT_AMBULATORY_CARE_PROVIDER_SITE_OTHER): Payer: Self-pay | Admitting: Podiatry

## 2017-12-26 ENCOUNTER — Ambulatory Visit (INDEPENDENT_AMBULATORY_CARE_PROVIDER_SITE_OTHER): Payer: Medicare Other

## 2017-12-26 DIAGNOSIS — S8262XA Displaced fracture of lateral malleolus of left fibula, initial encounter for closed fracture: Secondary | ICD-10-CM | POA: Diagnosis not present

## 2017-12-26 DIAGNOSIS — Z9889 Other specified postprocedural states: Secondary | ICD-10-CM

## 2017-12-26 NOTE — Telephone Encounter (Signed)
I informed Kathy Long - [facility], pt would like to discontinue the In-Home PT.

## 2017-12-26 NOTE — Telephone Encounter (Signed)
Kathy Arab, RN states pt requested to have PT at [facility] in [place] with Angelia Mould, PT.

## 2017-12-26 NOTE — Progress Notes (Signed)
Subjective: Kathy Long is a 67 y.o. is seen today in office s/p left ankle ORIF preformed on 11/30/2017 with Dr. March Rummage. They state their pain is minimal and she is not taking any pain medicine she has no pain specifically.  She is wearing the cam boot she is been nonweightbearing.  Apparently physical therapist and come to her house tomorrow to start.  She presents today for possible staple removal.. Denies any systemic complaints such as fevers, chills, nausea, vomiting. No calf pain, chest pain, shortness of breath.   Objective: General: No acute distress, AAOx3  DP/PT pulses palpable 2/4, CRT < 3 sec to all digits.  Protective sensation intact. Motor function intact.  LEFT foot: Incision is well coapted without any evidence of dehiscence and staples are intact. There is no surrounding erythema, ascending cellulitis, fluctuance, crepitus, malodor, drainage/purulence. There is mild edema around the surgical site. There is no significant pain along the surgical site.  No other areas of tenderness to bilateral lower extremities.  No other open lesions or pre-ulcerative lesions.  No pain with calf compression, swelling, warmth, erythema.   Assessment and Plan:  Status post left ankle surgery, doing well with no complications   -Treatment options discussed including all alternatives, risks, and complications -X-rays were obtained reviewed.  No evidence of any acute fracture there is hardware intact without any loosening. -Half of the staples removed today.  There is some mild motion across the incisions without the other half intact.  Antibiotic ointment was applied followed by a bandage. -Ice/elevation -Pain medication as needed. -Monitor for any clinical signs or symptoms of infection and DVT/PE and directed to call the office immediately should any occur or go to the ER. -Follow-up in 1 week with Dr. March Rummage for staple removal or sooner if any problems arise. In the meantime, encouraged to call  the office with any questions, concerns, change in symptoms.   Celesta Gentile, DPM

## 2017-12-28 DIAGNOSIS — S8262XA Displaced fracture of lateral malleolus of left fibula, initial encounter for closed fracture: Secondary | ICD-10-CM

## 2017-12-28 HISTORY — DX: Displaced fracture of lateral malleolus of left fibula, initial encounter for closed fracture: S82.62XA

## 2018-01-06 ENCOUNTER — Telehealth: Payer: Self-pay | Admitting: *Deleted

## 2018-01-06 ENCOUNTER — Ambulatory Visit (INDEPENDENT_AMBULATORY_CARE_PROVIDER_SITE_OTHER): Payer: Medicare Other | Admitting: Podiatry

## 2018-01-06 ENCOUNTER — Ambulatory Visit (INDEPENDENT_AMBULATORY_CARE_PROVIDER_SITE_OTHER): Payer: Medicare Other

## 2018-01-06 DIAGNOSIS — S8262XA Displaced fracture of lateral malleolus of left fibula, initial encounter for closed fracture: Secondary | ICD-10-CM

## 2018-01-06 DIAGNOSIS — Z9889 Other specified postprocedural states: Secondary | ICD-10-CM

## 2018-01-06 NOTE — Telephone Encounter (Signed)
Dr. March Rummage states please fax orders to [facility] and give a copy to pt. Copy of orders given to Dr. March Rummage for pt and faxed to [place].

## 2018-01-20 ENCOUNTER — Ambulatory Visit (INDEPENDENT_AMBULATORY_CARE_PROVIDER_SITE_OTHER): Payer: Medicare Other

## 2018-01-20 ENCOUNTER — Ambulatory Visit (INDEPENDENT_AMBULATORY_CARE_PROVIDER_SITE_OTHER): Payer: Medicare Other | Admitting: Podiatry

## 2018-01-20 ENCOUNTER — Encounter: Payer: Self-pay | Admitting: Podiatry

## 2018-01-20 DIAGNOSIS — Z9889 Other specified postprocedural states: Secondary | ICD-10-CM

## 2018-01-20 DIAGNOSIS — Z967 Presence of other bone and tendon implants: Secondary | ICD-10-CM

## 2018-01-20 DIAGNOSIS — S8262XA Displaced fracture of lateral malleolus of left fibula, initial encounter for closed fracture: Secondary | ICD-10-CM

## 2018-01-20 DIAGNOSIS — S8262XD Displaced fracture of lateral malleolus of left fibula, subsequent encounter for closed fracture with routine healing: Secondary | ICD-10-CM | POA: Diagnosis not present

## 2018-01-20 DIAGNOSIS — R600 Localized edema: Secondary | ICD-10-CM

## 2018-01-20 DIAGNOSIS — Z8781 Personal history of (healed) traumatic fracture: Secondary | ICD-10-CM

## 2018-01-27 NOTE — Progress Notes (Signed)
Subjective:  Patient ID: Kathy Long, female    DOB: [birth year],  MRN: [id number]  Chief Complaint  Patient presents with  . Routine Post Op    DOS: 11-30-2017 ANKLE ORIF LEFT WITH FIXATION   "Feeling great"    DOS: 11/30/17 Procedure: Left ankle ORIF  67 y.o. female returns for post-op check.  Doing very well.  Denies pain.  Review of Systems: Negative except as noted in the HPI. Denies N/V/F/Ch.  Past Medical History:  Diagnosis Date  . Acoustic neuroma (HCC)    left ear  . Cancer (HCC)    melanoma, shoulder  . Depression   . Hyperlipidemia   . Hypertension     Current Outpatient Medications:  .  amLODipine (NORVASC) 5 MG tablet, TAKE ONE TABLET EVERY DAY, Disp: 90 tablet, Rfl: 1 .  b complex vitamins tablet, Take 1 tablet by mouth daily., Disp: , Rfl:  .  Cholecalciferol 1000 UNITS tablet, Take 1,000 Units by mouth 2 (two) times daily., Disp: , Rfl:  .  citalopram (CELEXA) 20 MG tablet, TAKE ONE TABLET BY MOUTH EVERY DAY, Disp: 90 tablet, Rfl: 1 .  clobetasol (TEMOVATE) 0.05 % external solution, APPLY TOPICALLY EVERY DAY TIL RASH IS CLEAR, Disp: 50 mL, Rfl: 3 .  HYDROcodone-acetaminophen (NORCO) 10-325 MG tablet, Take 1 tablet by mouth every 6 (six) hours as needed., Disp: 30 tablet, Rfl: 0 .  lactobacillus (FLORANEX/LACTINEX) PACK, Take 1 packet (1 g total) by mouth 3 (three) times daily with meals., Disp: 20 packet, Rfl: 0 .  Melatonin 3 MG CAPS, Take 3 mg by mouth at bedtime., Disp: , Rfl:  .  metoprolol succinate (TOPROL-XL) 50 MG 24 hr tablet, TAKE ONE TABLET BY MOUTH EVERY DAY, Disp: 90 tablet, Rfl: 1 .  MYRBETRIQ 25 MG TB24 tablet, TAKE ONE TABLET EVERY DAY, Disp: 30 tablet, Rfl: 5 .  omeprazole (PRILOSEC) 40 MG capsule, TAKE 1 CAPSULE BY MOUTH EVERY DAY, Disp: 90 capsule, Rfl: 1 .  ondansetron (ZOFRAN) 4 MG tablet, Take 1 tablet (4 mg total) by mouth every 8 (eight) hours as needed for nausea or vomiting., Disp: 20 tablet, Rfl: 0 .  oxyCODONE-acetaminophen  (PERCOCET) 10-325 MG tablet, Take 1 tablet by mouth every 4 (four) hours as needed for pain., Disp: 20 tablet, Rfl: 0 .  rosuvastatin (CRESTOR) 10 MG tablet, TAKE ONE TABLET BY MOUTH EVERY DAY, Disp: 90 tablet, Rfl: 1 .  vitamin E 400 UNIT capsule, Take 400 Units by mouth daily., Disp: , Rfl:   Social History   Tobacco Use  Smoking Status Never Smoker  Smokeless Tobacco Never Used    Allergies  Allergen Reactions  . Morphine And Related Other (See Comments)    Chest pain  . Tramadol Other (See Comments)  . Tape Rash    Other reaction(s): UNKNOWN   Objective:  There were no vitals filed for this visit. There is no height or weight on file to calculate BMI. Constitutional Well developed. Well nourished.  Vascular Foot warm and well perfused. Capillary refill normal to all digits.   Neurologic Normal speech. Oriented to person, place, and time. Epicritic sensation to light touch grossly present bilaterally.  Dermatologic Skin well-healed.  Orthopedic:  No tenderness to palpation noted about the surgical site.   Radiographs: Taken reviewed.  Osseous bridging noted no evidence of hardware failure Assessment:   1. Closed fracture of distal lateral malleolus of left fibula with routine healing, subsequent encounter   2. Status post ORIF of fracture of  ankle   3. Post-operative state   4. Closed fracture of distal lateral malleolus of left fibula, initial encounter   5. Localized edema    Plan:  Patient was evaluated and treated and all questions answered.  S/p foot surgery left -Progressing as expected post-operatively. -XR: Taken as above -Sutures: Out. -Medications: None -Continue PT. Transition weightbearing in PT  Return in about 2 weeks (around 02/03/2018) for Post-op.

## 2018-02-03 ENCOUNTER — Ambulatory Visit (INDEPENDENT_AMBULATORY_CARE_PROVIDER_SITE_OTHER): Payer: Medicare Other

## 2018-02-03 ENCOUNTER — Ambulatory Visit (INDEPENDENT_AMBULATORY_CARE_PROVIDER_SITE_OTHER): Payer: Medicare Other | Admitting: Podiatry

## 2018-02-03 DIAGNOSIS — S8262XD Displaced fracture of lateral malleolus of left fibula, subsequent encounter for closed fracture with routine healing: Secondary | ICD-10-CM

## 2018-02-03 DIAGNOSIS — R269 Unspecified abnormalities of gait and mobility: Secondary | ICD-10-CM | POA: Diagnosis not present

## 2018-02-03 NOTE — Progress Notes (Signed)
Subjective:  Patient ID: Kathy Long, female    DOB: [birth year],  MRN: [id number]  Chief Complaint  Patient presents with  . Fracture    left fibula fracture    DOS: 11/30/17 Procedure: Left ankle ORIF  67 y.o. female returns for post-op check.  Denies pain. Working with PT on ROM exercises. Denies pain. Walking today with walker and ankle brace.  Review of Systems: Negative except as noted in the HPI. Denies N/V/F/Ch.  Past Medical History:  Diagnosis Date  . Acoustic neuroma (HCC)    left ear  . Cancer (HCC)    melanoma, shoulder  . Depression   . Hyperlipidemia   . Hypertension     Current Outpatient Medications:  .  amLODipine (NORVASC) 5 MG tablet, TAKE ONE TABLET EVERY DAY, Disp: 90 tablet, Rfl: 1 .  b complex vitamins tablet, Take 1 tablet by mouth daily., Disp: , Rfl:  .  Cholecalciferol 1000 UNITS tablet, Take 1,000 Units by mouth 2 (two) times daily., Disp: , Rfl:  .  citalopram (CELEXA) 20 MG tablet, TAKE ONE TABLET BY MOUTH EVERY DAY, Disp: 90 tablet, Rfl: 1 .  clobetasol (TEMOVATE) 0.05 % external solution, APPLY TOPICALLY EVERY DAY TIL RASH IS CLEAR, Disp: 50 mL, Rfl: 3 .  HYDROcodone-acetaminophen (NORCO) 10-325 MG tablet, Take 1 tablet by mouth every 6 (six) hours as needed., Disp: 30 tablet, Rfl: 0 .  lactobacillus (FLORANEX/LACTINEX) PACK, Take 1 packet (1 g total) by mouth 3 (three) times daily with meals., Disp: 20 packet, Rfl: 0 .  Melatonin 3 MG CAPS, Take 3 mg by mouth at bedtime., Disp: , Rfl:  .  metoprolol succinate (TOPROL-XL) 50 MG 24 hr tablet, TAKE ONE TABLET BY MOUTH EVERY DAY, Disp: 90 tablet, Rfl: 1 .  MYRBETRIQ 25 MG TB24 tablet, TAKE ONE TABLET EVERY DAY, Disp: 30 tablet, Rfl: 5 .  omeprazole (PRILOSEC) 40 MG capsule, TAKE 1 CAPSULE BY MOUTH EVERY DAY, Disp: 90 capsule, Rfl: 1 .  ondansetron (ZOFRAN) 4 MG tablet, Take 1 tablet (4 mg total) by mouth every 8 (eight) hours as needed for nausea or vomiting., Disp: 20 tablet, Rfl: 0 .   oxyCODONE-acetaminophen (PERCOCET) 10-325 MG tablet, Take 1 tablet by mouth every 4 (four) hours as needed for pain., Disp: 20 tablet, Rfl: 0 .  rosuvastatin (CRESTOR) 10 MG tablet, TAKE ONE TABLET BY MOUTH EVERY DAY, Disp: 90 tablet, Rfl: 1 .  vitamin E 400 UNIT capsule, Take 400 Units by mouth daily., Disp: , Rfl:   Social History   Tobacco Use  Smoking Status Never Smoker  Smokeless Tobacco Never Used    Allergies  Allergen Reactions  . Morphine And Related Other (See Comments)    Chest pain  . Tramadol Other (See Comments)  . Tape Rash    Other reaction(s): UNKNOWN   Objective:  There were no vitals filed for this visit. There is no height or weight on file to calculate BMI. Constitutional Well developed. Well nourished.  Vascular Foot warm and well perfused. Capillary refill normal to all digits.   Neurologic Normal speech. Oriented to person, place, and time. Epicritic sensation to light touch grossly present bilaterally.  Dermatologic Skin well-healed.  Orthopedic: No tenderness to palpation noted about the surgical site. Good ankle ROM noted.   Radiographs: Taken reviewed. Foot views. Hardware intact. Assessment:   1. Closed fracture of distal lateral malleolus of left fibula with routine healing, subsequent encounter    Plan:  Patient was evaluated and treated  and all questions answered.  S/p foot surgery left -Progressing as expected post-operatively. -XR: Taken as above -Sutures: Out. -Medications: None -Continue PT. Transition weightbearing in PT -D/c walking boot. Dispense trilock ankle brace.  Return in about 1 month (around 03/05/2018) for Post-op.

## 2018-02-08 NOTE — Progress Notes (Signed)
  Subjective:  Patient ID: Kathy Long, female    DOB: 12-07-1950,  MRN: [id number]  Chief Complaint  Patient presents with  . Routine Post Op    (xray)dos 07.03.2019 Ankle ORIF Lt w/ Fixation    DOS: 11/30/17 Procedure: Left Ankle ORIF  67 y.o. female returns for post-op check. Denies N/V/F/Ch. Here for staple removal. Denies complaints.  Objective:   General AA&O x3. Normal mood and affect.  Vascular Foot warm and well perfused.  Neurologic Gross sensation intact.  Dermatologic Skin healing well without signs of infection. Skin edges well coapted without signs of infection.  Orthopedic: Tenderness to palpation noted about the surgical site.    Assessment & Plan:  Patient was evaluated and treated and all questions answered.  S/p left ankle ORIF  -Progressing as expected post-operatively. -Staples: removed. Steris applied.. -Medications refilled: none -Continue home PT and NWB.  Return in about 2 weeks (around 01/20/2018) for Post-op.

## 2018-02-28 ENCOUNTER — Encounter: Payer: Self-pay | Admitting: Internal Medicine

## 2018-02-28 ENCOUNTER — Ambulatory Visit: Payer: Medicare Other | Admitting: Internal Medicine

## 2018-02-28 VITALS — BP 130/88 | HR 49 | Temp 98.4°F | Resp 15 | Ht 62.0 in | Wt 169.0 lb

## 2018-02-28 DIAGNOSIS — K915 Postcholecystectomy syndrome: Secondary | ICD-10-CM | POA: Diagnosis not present

## 2018-02-28 DIAGNOSIS — Z23 Encounter for immunization: Secondary | ICD-10-CM

## 2018-02-28 DIAGNOSIS — R7303 Prediabetes: Secondary | ICD-10-CM | POA: Diagnosis not present

## 2018-02-28 DIAGNOSIS — S8262XD Displaced fracture of lateral malleolus of left fibula, subsequent encounter for closed fracture with routine healing: Secondary | ICD-10-CM

## 2018-02-28 DIAGNOSIS — E6609 Other obesity due to excess calories: Secondary | ICD-10-CM | POA: Diagnosis not present

## 2018-02-28 DIAGNOSIS — F330 Major depressive disorder, recurrent, mild: Secondary | ICD-10-CM | POA: Diagnosis not present

## 2018-02-28 DIAGNOSIS — Z6830 Body mass index (BMI) 30.0-30.9, adult: Secondary | ICD-10-CM

## 2018-02-28 MED ORDER — LOSARTAN POTASSIUM-HCTZ 50-12.5 MG PO TABS: 1.0000 | ORAL_TABLET | Freq: Every day | ORAL | 3 refills | Status: DC

## 2018-02-28 MED ORDER — METOPROLOL SUCCINATE ER 25 MG PO TB24: 25.0000 mg | ORAL_TABLET | Freq: Every day | ORAL | 9 refills | Status: DC

## 2018-02-28 MED ORDER — METOPROLOL SUCCINATE ER 25 MG PO TB24: 25.0000 mg | ORAL_TABLET | Freq: Every day | ORAL | 3 refills | Status: DC

## 2018-02-28 NOTE — Assessment & Plan Note (Signed)
Reassured that her recurrent right upper quadrant pain is due to stretching and tension on the liver capsuel form internal stitches , since  she has no other symptoms

## 2018-02-28 NOTE — Patient Instructions (Addendum)
Reduce the metoprolol to 25 mg daily asap   I am Changing amlodipine to losartan /hctz   One tablet  daily    Suspend if you get GI bug   Return for nonfasting labs one week after your medication change and we'll repeat your A1c too   Lab Results  Component Value Date   HGBA1C 5.8 06/10/2017

## 2018-02-28 NOTE — Assessment & Plan Note (Signed)
requiring surgery with brace and screws .  Encouraged to stay active and wear compression stocking to avoid venous insufficiency changes

## 2018-02-28 NOTE — Assessment & Plan Note (Signed)
I have c reviewed    BMI and encouraged  Continued weight loss with goal of 10% of body weight over the next 6 months using a low glycemic index diet and regular exercise a minimum of 5 days per week.

## 2018-02-28 NOTE — Assessment & Plan Note (Signed)
Symptoms are gone but she has stress, advised to continue citalopram for now,  Discussed weaning down the road 

## 2018-02-28 NOTE — Progress Notes (Signed)
Subjective:  Patient ID: Kathy Long, female    DOB: 16-May-1951  Age: 67 y.o. MRN: [id number]  CC: The primary encounter diagnosis was Prediabetes. Diagnoses of Encounter for immunization, Mild episode of recurrent major depressive disorder ([employer]), Postcholecystectomy syndrome, Class 1 obesity due to excess calories without serious comorbidity with body mass index (BMI) of 30.0 to 30.9 in adult, and Closed fracture of distal lateral malleolus of left fibula with routine healing, subsequent encounter were also pertinent to this visit.  HPI Kathy Long presents for follow up  On hypertension ,  GAD and insmonia  1) ankle fracture,  S/o ORIF.  She has recovered from surgery to repair a lateral  malleolar left ankle fracture. She is no longer using a walker  But brings a cane for support.  She has difficulty walking across parking lots and is requesting  That her handicapped sticker renewed   2) continues to have  Nocturnal RUQ pain that occurs when supine and takes a  deep breath,     Resolves after a  A few breaths,  Not present during day and not after eating   3) Obesity:  Has regained 10 lb since recovering from surgery June 25 .  She is stress eating due to the pressures of her new job. 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  The director of the program  Is blunt and adversarial. 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  Has been working 10-12 hours daily 6-7 days per week      Outpatient Medications Prior to Visit  Medication Sig Dispense Refill  . b complex vitamins tablet Take 1 tablet by mouth daily.    . Cholecalciferol 1000 UNITS tablet Take 1,000 Units by mouth 2 (two) times daily.    . citalopram (CELEXA) 20 MG tablet TAKE ONE TABLET BY MOUTH EVERY DAY 90 tablet 1  .  clobetasol (TEMOVATE) 0.05 % external solution APPLY TOPICALLY EVERY DAY TIL RASH IS CLEAR 50 mL 3  . Melatonin 3 MG CAPS Take 3 mg by mouth at bedtime.    Marland Kitchen omeprazole (PRILOSEC) 40 MG capsule TAKE 1 CAPSULE BY MOUTH EVERY DAY 90 capsule 1  . rosuvastatin (CRESTOR) 10 MG tablet TAKE ONE TABLET BY MOUTH EVERY DAY 90 tablet 1  . vitamin E 400 UNIT capsule Take 400 Units by mouth daily.    Marland Kitchen amLODipine (NORVASC) 5 MG tablet TAKE ONE TABLET EVERY DAY 90 tablet 1  . metoprolol succinate (TOPROL-XL) 50 MG 24 hr tablet TAKE ONE TABLET BY MOUTH EVERY DAY 90 tablet 1  . ondansetron (ZOFRAN) 4 MG tablet Take 1 tablet (4 mg total) by mouth every 8 (eight) hours as needed for nausea or vomiting. (Patient not taking: Reported on 02/28/2018) 20 tablet 0  . HYDROcodone-acetaminophen (NORCO) 10-325 MG tablet Take 1 tablet by mouth every 6 (six) hours as needed. (Patient not taking: Reported on 02/28/2018) 30 tablet 0  . lactobacillus (FLORANEX/LACTINEX) PACK Take 1 packet (1 g total) by mouth 3 (three) times daily with meals. (Patient not taking: Reported on 02/28/2018) 20 packet 0  . MYRBETRIQ 25 MG TB24 tablet TAKE ONE TABLET EVERY DAY (Patient not taking: Reported on 02/28/2018) 30 tablet 5  . oxyCODONE-acetaminophen (PERCOCET) 10-325 MG tablet Take  1 tablet by mouth every 4 (four) hours as needed for pain. (Patient not taking: Reported on 02/28/2018) 20 tablet 0   No facility-administered medications prior to visit.     Review of Systems;  Patient denies headache, fevers, malaise, unintentional weight loss, skin rash, eye pain, sinus congestion and sinus pain, sore throat, dysphagia,  hemoptysis , cough, dyspnea, wheezing, chest pain, palpitations, orthopnea, edema, abdominal pain, nausea, melena, diarrhea, constipation, flank pain, dysuria, hematuria, urinary  Frequency, nocturia, numbness, tingling, seizures,  Focal weakness, Loss of consciousness,  Tremor, insomnia, depression, anxiety, and suicidal ideation.        Objective:  BP 130/88 (BP Location: Left Arm, Patient Position: Sitting, Cuff Size: Normal)   Pulse (!) 49   Temp 98.4 F (36.9 C) (Oral)   Resp 15   Ht 5\' 2"  (1.575 m)   Wt 169 lb (76.7 kg)   SpO2 98%   BMI 30.91 kg/m   BP Readings from Last 3 Encounters:  02/28/18 130/88  11/28/17 128/82  11/24/17 127/87    Wt Readings from Last 3 Encounters:  02/28/18 169 lb (76.7 kg)  11/28/17 168 lb 12.8 oz (76.6 kg)  11/22/17 159 lb (72.1 kg)    General appearance: alert, cooperative and appears stated age Ears: normal TM's and external ear canals both ears Throat: lips, mucosa, and tongue normal; teeth and gums normal Neck: no adenopathy, no carotid bruit, supple, symmetrical, trachea midline and thyroid not enlarged, symmetric, no tenderness/mass/nodules Back: symmetric, no curvature. ROM normal. No CVA tenderness. Lungs: clear to auscultation bilaterally Heart: regular rate and rhythm, S1, S2 normal, no murmur, click, rub or gallop Abdomen: soft, non-tender; bowel sounds normal; no masses,  no organomegaly Pulses: 2+ and symmetric Skin: Skin color, texture, turgor normal. No rashes or lesions Lymph nodes: Cervical, supraclavicular, and axillary nodes normal.  Lab Results  Component Value Date   HGBA1C 5.8 06/10/2017   HGBA1C 5.7 06/02/2016    Lab Results  Component Value Date   CREATININE 0.93 11/28/2017   CREATININE 1.42 (H) 11/23/2017   CREATININE 2.60 (H) 11/22/2017    Lab Results  Component Value Date   WBC 6.9 11/22/2017   HGB 14.9 11/22/2017   HCT 44.2 11/22/2017   PLT 221 11/22/2017   GLUCOSE 99 11/28/2017   CHOL 128 11/28/2017   TRIG 139.0 11/28/2017   HDL 35.80 (L) 11/28/2017   LDLCALC 65 11/28/2017   ALT 28 11/22/2017   AST 34 11/22/2017   NA 145 11/28/2017   K 4.9 11/28/2017   CL 107 11/28/2017   CREATININE 0.93 11/28/2017   BUN 15 11/28/2017   CO2 31 11/28/2017   TSH 1.50 06/10/2017   INR 0.91 10/23/2009   HGBA1C 5.8 06/10/2017     No results found.  Assessment & Plan:   Problem List Items Addressed This Visit    Closed fracture of distal lateral malleolus of left ankle    requiring surgery with brace and screws .  Encouraged to stay active and wear compression stocking to avoid venous insufficiency changes       Major depressive disorder, 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[place])    Symptoms are gone but she has stress, advised to continue citalopram for now,  Discussed weaning down the road      Obesity    I have c reviewed    BMI and encouraged  Continued weight loss with goal of 10% of body weight over the next 6 months using a low glycemic index diet and regular exercise  a minimum of 5 days per week.        Postcholecystectomy syndrome    Reassured that her recurrent right upper quadrant pain is due to stretching and tension on the liver capsuel form internal stitches , since  she has no other symptoms        Other Visit Diagnoses    Prediabetes    -  Primary   Relevant Orders   Hemoglobin A1c   Comprehensive metabolic panel   Encounter for immunization       Relevant Orders   Flu vaccine HIGH DOSE PF (Completed)      I have discontinued Kathy Long "Kathy Long"'s MYRBETRIQ, lactobacillus, HYDROcodone-acetaminophen, oxyCODONE-acetaminophen, and amLODipine. I am also having her start on losartan-hydrochlorothiazide. Additionally, I am having her maintain her Cholecalciferol, Melatonin, vitamin E, b complex vitamins, rosuvastatin, ondansetron, clobetasol, omeprazole, citalopram, and metoprolol succinate.  Meds ordered this encounter  Medications  . losartan-hydrochlorothiazide (HYZAAR) 50-12.5 MG tablet    Sig: Take 1 tablet by mouth daily.    Dispense:  90 tablet    Refill:  3  . DISCONTD: metoprolol succinate (TOPROL-XL) 25 MG 24 hr tablet    Sig: Take 1 tablet (25 mg total) by mouth daily. Take with or immediately following a meal.    Dispense:  89 tablet    Refill:  9  . metoprolol succinate (TOPROL-XL)  25 MG 24 hr tablet    Sig: Take 1 tablet (25 mg total) by mouth daily. Take with or immediately following a meal.    Dispense:  90 tablet    Refill:  3    CORRECTED DOSE    Medications Discontinued During This Encounter  Medication Reason  . HYDROcodone-acetaminophen (NORCO) 10-325 MG tablet Completed Course  . lactobacillus (FLORANEX/LACTINEX) PACK Completed Course  . MYRBETRIQ 25 MG TB24 tablet Patient Preference  . oxyCODONE-acetaminophen (PERCOCET) 10-325 MG tablet Completed Course  . amLODipine (NORVASC) 5 MG tablet   . metoprolol succinate (TOPROL-XL) 50 MG 24 hr tablet   . metoprolol succinate (TOPROL-XL) 25 MG 24 hr tablet Reorder    Follow-up: Return in about 6 months (around 08/30/2018).   [name], MD

## 2018-03-03 ENCOUNTER — Ambulatory Visit (INDEPENDENT_AMBULATORY_CARE_PROVIDER_SITE_OTHER): Payer: Medicare Other | Admitting: Podiatry

## 2018-03-03 ENCOUNTER — Ambulatory Visit (INDEPENDENT_AMBULATORY_CARE_PROVIDER_SITE_OTHER): Payer: Medicare Other

## 2018-03-03 ENCOUNTER — Other Ambulatory Visit: Payer: Self-pay | Admitting: Podiatry

## 2018-03-03 DIAGNOSIS — Z9889 Other specified postprocedural states: Secondary | ICD-10-CM

## 2018-03-03 DIAGNOSIS — M79672 Pain in left foot: Secondary | ICD-10-CM

## 2018-03-03 DIAGNOSIS — Z8781 Personal history of (healed) traumatic fracture: Secondary | ICD-10-CM

## 2018-03-03 DIAGNOSIS — S82402D Unspecified fracture of shaft of left fibula, subsequent encounter for closed fracture with routine healing: Secondary | ICD-10-CM

## 2018-03-03 DIAGNOSIS — S8262XD Displaced fracture of lateral malleolus of left fibula, subsequent encounter for closed fracture with routine healing: Secondary | ICD-10-CM

## 2018-03-03 NOTE — Progress Notes (Signed)
Subjective:  Patient ID: Kathy Long, female    DOB: 03/11/1951,  MRN: [id number]  Chief Complaint  Patient presents with  . Routine Post Op    Left foot routine post op. Pt states they are healing well. Compression stocking helps. Physical therapy causes a little pain.    DOS: 11/30/17 Procedure: Left ankle ORIF  67 y.o. female returns for surgical f/u.  Doing very well.  Denies pain.  Has been doing physical therapy.  Endorses just a little bit of swelling but not true pain.  Review of Systems: Negative except as noted in the HPI. Denies N/V/F/Ch.  Past Medical History:  Diagnosis Date  . Acoustic neuroma (HCC)    left ear  . Cancer (HCC)    melanoma, shoulder  . Depression   . Hyperlipidemia   . Hypertension     Current Outpatient Medications:  .  b complex vitamins tablet, Take 1 tablet by mouth daily., Disp: , Rfl:  .  Cholecalciferol 1000 UNITS tablet, Take 1,000 Units by mouth 2 (two) times daily., Disp: , Rfl:  .  citalopram (CELEXA) 20 MG tablet, TAKE ONE TABLET BY MOUTH EVERY DAY, Disp: 90 tablet, Rfl: 1 .  clobetasol (TEMOVATE) 0.05 % external solution, APPLY TOPICALLY EVERY DAY TIL RASH IS CLEAR, Disp: 50 mL, Rfl: 3 .  losartan-hydrochlorothiazide (HYZAAR) 50-12.5 MG tablet, Take 1 tablet by mouth daily., Disp: 90 tablet, Rfl: 3 .  Melatonin 3 MG CAPS, Take 3 mg by mouth at bedtime., Disp: , Rfl:  .  metoprolol succinate (TOPROL-XL) 25 MG 24 hr tablet, Take 1 tablet (25 mg total) by mouth daily. Take with or immediately following a meal., Disp: 90 tablet, Rfl: 3 .  omeprazole (PRILOSEC) 40 MG capsule, TAKE 1 CAPSULE BY MOUTH EVERY DAY, Disp: 90 capsule, Rfl: 1 .  ondansetron (ZOFRAN) 4 MG tablet, Take 1 tablet (4 mg total) by mouth every 8 (eight) hours as needed for nausea or vomiting. (Patient not taking: Reported on 02/28/2018), Disp: 20 tablet, Rfl: 0 .  rosuvastatin (CRESTOR) 10 MG tablet, TAKE ONE TABLET BY MOUTH EVERY DAY, Disp: 90 tablet, Rfl: 1 .  vitamin E  400 UNIT capsule, Take 400 Units by mouth daily., Disp: , Rfl:   Social History   Tobacco Use  Smoking Status Never Smoker  Smokeless Tobacco Never Used    Allergies  Allergen Reactions  . Morphine And Related Other (See Comments)    Chest pain  . Tramadol Other (See Comments)  . Tape Rash    Other reaction(s): UNKNOWN   Objective:  There were no vitals filed for this visit. There is no height or weight on file to calculate BMI. Constitutional Well developed. Well nourished.  Vascular Foot warm and well perfused. Capillary refill normal to all digits.   Neurologic Normal speech. Oriented to person, place, and time. Epicritic sensation to light touch grossly present bilaterally.  Dermatologic Skin well-healed.  Orthopedic: No tenderness to palpation noted about the surgical site. Good ankle ROM noted.  No pain or crepitus   Radiographs: Taken reviewed.  Foot views.  Hardware appears intact. Assessment:   1. Foot pain, left    Plan:  Patient was evaluated and treated and all questions answered.  S/p foot surgery left -Progressing as expected post-operatively. -XR: Taken as above -Sutures: Out. -Medications: None -Continue PT to maximal medical improvement. -Weight-bear as tolerated without restrictions.  Return in about 8 weeks (around 04/28/2018) for Ankle Fracture f/u.  Needs ankle and foot x-rays  at next visit

## 2018-03-09 ENCOUNTER — Telehealth: Payer: Self-pay | Admitting: Internal Medicine

## 2018-03-09 ENCOUNTER — Other Ambulatory Visit (INDEPENDENT_AMBULATORY_CARE_PROVIDER_SITE_OTHER): Payer: Medicare Other

## 2018-03-09 DIAGNOSIS — R7303 Prediabetes: Secondary | ICD-10-CM

## 2018-03-09 LAB — COMPREHENSIVE METABOLIC PANEL
ALT: 15 U/L (ref 0–35)
AST: 16 U/L (ref 0–37)
Albumin: 4.1 g/dL (ref 3.5–5.2)
Alkaline Phosphatase: 90 U/L (ref 39–117)
BUN: 21 mg/dL (ref 6–23)
CO2: 32 mEq/L (ref 19–32)
Calcium: 9.7 mg/dL (ref 8.4–10.5)
Chloride: 106 mEq/L (ref 96–112)
Creatinine, Ser: 1.23 mg/dL — ABNORMAL HIGH (ref 0.40–1.20)
GFR: 45.92 mL/min — ABNORMAL LOW (ref >60.00)
Glucose, Bld: 88 mg/dL (ref 70–99)
Potassium: 3.9 mEq/L (ref 3.5–5.1)
Sodium: 143 mEq/L (ref 135–145)
Total Bilirubin: 0.9 mg/dL (ref 0.2–1.2)
Total Protein: 7 g/dL (ref 6.0–8.3)

## 2018-03-09 LAB — HEMOGLOBIN A1C: Hgb A1c MFr Bld: 5.4 % (ref 4.6–6.5)

## 2018-03-09 NOTE — Telephone Encounter (Signed)
Have her stop the losartan hct and resume the amlodipine starting at 5 mg daily,  .  Report the BP and pulse in a week,

## 2018-03-09 NOTE — Telephone Encounter (Signed)
LMTCB. PEC may speak with pt.  

## 2018-03-09 NOTE — Telephone Encounter (Signed)
Copied from [place] [id number]. Topic: Quick Communication - See Telephone Encounter >> Mar 09, 2018 11:34 AM Kathy Long wrote: CRM for notification. See Telephone encounter for: 03/09/18.  Patient thinks her blood pressure is getting higher taking the losartan-hydrochlorothiazide (HYZAAR) 50-12.5 MG tablet  Her readings have been running around 163/91, 149/91, 149/82. She has not took the losartan-hydrochlorothiazide (HYZAAR) 50-12.5 MG tablet today. Patient is coming in today for her blood work. Please Advise.

## 2018-03-09 NOTE — Telephone Encounter (Signed)
Please advise 

## 2018-03-12 ENCOUNTER — Other Ambulatory Visit: Payer: Self-pay | Admitting: Internal Medicine

## 2018-03-12 DIAGNOSIS — N179 Acute kidney failure, unspecified: Secondary | ICD-10-CM

## 2018-03-12 DIAGNOSIS — I10 Essential (primary) hypertension: Secondary | ICD-10-CM

## 2018-03-12 MED ORDER — AMLODIPINE BESYLATE 5 MG PO TABS: 5.0000 mg | ORAL_TABLET | Freq: Every day | ORAL | 0 refills | Status: DC

## 2018-03-12 NOTE — Assessment & Plan Note (Signed)
bp became elevated and cr rose on losartan ct.  Resuming amlodipine  5 Mg

## 2018-03-14 NOTE — Telephone Encounter (Signed)
LMTCB. PEC may speak with pt.  

## 2018-03-21 NOTE — Telephone Encounter (Signed)
Have not been able to reach pt.

## 2018-03-22 ENCOUNTER — Other Ambulatory Visit: Payer: Self-pay | Admitting: Internal Medicine

## 2018-04-10 ENCOUNTER — Telehealth: Payer: Self-pay | Admitting: Internal Medicine

## 2018-04-10 ENCOUNTER — Other Ambulatory Visit: Payer: Self-pay

## 2018-04-10 ENCOUNTER — Telehealth: Payer: Self-pay

## 2018-04-10 ENCOUNTER — Encounter: Payer: Self-pay | Admitting: *Deleted

## 2018-04-10 ENCOUNTER — Other Ambulatory Visit (INDEPENDENT_AMBULATORY_CARE_PROVIDER_SITE_OTHER): Payer: Medicare Other

## 2018-04-10 DIAGNOSIS — N179 Acute kidney failure, unspecified: Secondary | ICD-10-CM

## 2018-04-10 NOTE — Telephone Encounter (Signed)
Lab was ordered and pt came in today to have lab drawn.

## 2018-04-10 NOTE — Telephone Encounter (Signed)
Please advise 

## 2018-04-10 NOTE — Telephone Encounter (Signed)
Copied from [place] [id number]. Topic: Quick Communication - See Telephone Encounter >> Apr 10, 2018  2:50 PM Blase Mess A wrote: CRM for notification. See Telephone encounter for: 04/10/18.  Patient is having surgery on Wednesday. The pre surgery nurse is requesting to redo kidney blood work redone today. Please advise [phone]

## 2018-04-10 NOTE — Discharge Instructions (Signed)

## 2018-04-10 NOTE — Telephone Encounter (Signed)
Lab was drawn at 3:30pm today.

## 2018-04-10 NOTE — Telephone Encounter (Signed)
Copied from [place] [id number]. Topic: General - Other >> Apr 10, 2018  3:14 PM Leward Quan A wrote: Reason for CRM: Patient will be in to have labs done was scheduled late so she will be in about 3.30pm. Please assist. Thank you

## 2018-04-11 LAB — BASIC METABOLIC PANEL
BUN: 22 mg/dL (ref 6–23)
CO2: 26 mEq/L (ref 19–32)
Calcium: 9.5 mg/dL (ref 8.4–10.5)
Chloride: 107 mEq/L (ref 96–112)
Creatinine, Ser: 1.06 mg/dL (ref 0.40–1.20)
GFR: 54.5 mL/min — ABNORMAL LOW (ref >60.00)
Glucose, Bld: 89 mg/dL (ref 70–99)
Potassium: 3.8 mEq/L (ref 3.5–5.1)
Sodium: 143 mEq/L (ref 135–145)

## 2018-04-12 ENCOUNTER — Ambulatory Visit: Payer: Medicare Other | Admitting: Anesthesiology

## 2018-04-12 ENCOUNTER — Ambulatory Visit
Admission: RE | Admit: 2018-04-12 | Discharge: 2018-04-12 | Disposition: A | Discharge status: Home / Self Care | Payer: Medicare Other | Source: Ambulatory Visit | Attending: Ophthalmology | Admitting: Ophthalmology

## 2018-04-12 ENCOUNTER — Encounter
Admission: RE | Disposition: A | Discharge status: Home / Self Care | Payer: Self-pay | Source: Ambulatory Visit | Attending: Ophthalmology

## 2018-04-12 DIAGNOSIS — H2512 Age-related nuclear cataract, left eye: Secondary | ICD-10-CM | POA: Insufficient documentation

## 2018-04-12 DIAGNOSIS — Z85820 Personal history of malignant melanoma of skin: Secondary | ICD-10-CM | POA: Insufficient documentation

## 2018-04-12 DIAGNOSIS — F329 Major depressive disorder, single episode, unspecified: Secondary | ICD-10-CM | POA: Insufficient documentation

## 2018-04-12 DIAGNOSIS — I10 Essential (primary) hypertension: Secondary | ICD-10-CM | POA: Insufficient documentation

## 2018-04-12 DIAGNOSIS — E7800 Pure hypercholesterolemia, unspecified: Secondary | ICD-10-CM | POA: Insufficient documentation

## 2018-04-12 DIAGNOSIS — K219 Gastro-esophageal reflux disease without esophagitis: Secondary | ICD-10-CM | POA: Insufficient documentation

## 2018-04-12 DIAGNOSIS — Z79899 Other long term (current) drug therapy: Secondary | ICD-10-CM | POA: Diagnosis not present

## 2018-04-12 HISTORY — DX: Unspecified osteoarthritis, unspecified site: M19.90

## 2018-04-12 HISTORY — DX: Presence of external hearing-aid: Z97.4

## 2018-04-12 HISTORY — PX: CATARACT EXTRACTION W/PHACO: SHX586

## 2018-04-12 HISTORY — DX: Gastro-esophageal reflux disease without esophagitis: K21.9

## 2018-04-12 SURGERY — PHACOEMULSIFICATION, CATARACT, WITH IOL INSERTION
Anesthesia: Monitor Anesthesia Care | Laterality: Left

## 2018-04-12 MED ORDER — FENTANYL CITRATE (PF) 100 MCG/2ML IJ SOLN
INTRAMUSCULAR | Status: DC | PRN
  Administered 2018-04-12: 50 ug via INTRAVENOUS

## 2018-04-12 MED ORDER — TETRACAINE HCL 0.5 % OP SOLN
1.0000 [drp] | OPHTHALMIC | Status: DC | PRN
  Administered 2018-04-12 (×2): 1 [drp] via OPHTHALMIC

## 2018-04-12 MED ORDER — MIDAZOLAM HCL 2 MG/2ML IJ SOLN
INTRAMUSCULAR | Status: DC | PRN
  Administered 2018-04-12: 2 mg via INTRAVENOUS

## 2018-04-12 MED ORDER — LACTATED RINGERS IV SOLN: 1000.0000 mL | INTRAVENOUS | Status: DC

## 2018-04-12 MED ORDER — EPINEPHRINE PF 1 MG/ML IJ SOLN
INTRAOCULAR | Status: DC | PRN
  Administered 2018-04-12: 56 mL via OPHTHALMIC

## 2018-04-12 MED ORDER — CEFUROXIME OPHTHALMIC INJECTION 1 MG/0.1 ML
INJECTION | OPHTHALMIC | Status: DC | PRN
  Administered 2018-04-12: 1 mg via INTRACAMERAL

## 2018-04-12 MED ORDER — MOXIFLOXACIN HCL 0.5 % OP SOLN
1.0000 [drp] | OPHTHALMIC | Status: DC | PRN
  Administered 2018-04-12 (×3): 1 [drp] via OPHTHALMIC

## 2018-04-12 MED ORDER — ARMC OPHTHALMIC DILATING DROPS
1.0000 "application " | OPHTHALMIC | Status: DC | PRN
  Administered 2018-04-12 (×3): 1 via OPHTHALMIC

## 2018-04-12 MED ORDER — NA HYALUR & NA CHOND-NA HYALUR 0.4-0.35 ML IO KIT
PACK | INTRAOCULAR | Status: DC | PRN
  Administered 2018-04-12: 1 mL via INTRAOCULAR

## 2018-04-12 MED ORDER — BRIMONIDINE TARTRATE-TIMOLOL 0.2-0.5 % OP SOLN
OPHTHALMIC | Status: DC | PRN
  Administered 2018-04-12: 1 [drp] via OPHTHALMIC

## 2018-04-12 MED ORDER — LIDOCAINE HCL (PF) 2 % IJ SOLN
INTRAOCULAR | Status: DC | PRN
  Administered 2018-04-12: 1 mL via INTRAMUSCULAR

## 2018-04-12 SURGICAL SUPPLY — 27 items
Acrysof toric iol (Intraocular Lens) ×1 IMPLANT
CANNULA ANT/CHMB 27G (MISCELLANEOUS) ×1 IMPLANT
CANNULA ANT/CHMB 27GA (MISCELLANEOUS) ×2 IMPLANT
CARTRIDGE ABBOTT (MISCELLANEOUS) IMPLANT
GLOVE SURG LX 7.5 STRW (GLOVE) ×1
GLOVE SURG LX STRL 7.5 STRW (GLOVE) ×1 IMPLANT
GLOVE SURG TRIUMPH 8.0 PF LTX (GLOVE) ×2 IMPLANT
GOWN STRL REUS W/ TWL LRG LVL3 (GOWN DISPOSABLE) ×2 IMPLANT
GOWN STRL REUS W/TWL LRG LVL3 (GOWN DISPOSABLE) ×2
MARKER SKIN DUAL TIP RULER LAB (MISCELLANEOUS) ×2 IMPLANT
NDL FILTER BLUNT 18X1 1/2 (NEEDLE) ×1 IMPLANT
NDL RETROBULBAR .5 NSTRL (NEEDLE) IMPLANT
NEEDLE FILTER BLUNT 18X 1/2SAF (NEEDLE) ×1
NEEDLE FILTER BLUNT 18X1 1/2 (NEEDLE) ×1 IMPLANT
PACK CATARACT BRASINGTON (MISCELLANEOUS) ×2 IMPLANT
PACK EYE AFTER SURG (MISCELLANEOUS) ×2 IMPLANT
PACK OPTHALMIC (MISCELLANEOUS) ×2 IMPLANT
RING MALYGIN 7.0 (MISCELLANEOUS) IMPLANT
SUT ETHILON 10-0 CS-B-6CS-B-6 (SUTURE)
SUT VICRYL  9 0 (SUTURE)
SUT VICRYL 9 0 (SUTURE) IMPLANT
SUTURE EHLN 10-0 CS-B-6CS-B-6 (SUTURE) IMPLANT
SYR 3ML LL SCALE MARK (SYRINGE) ×2 IMPLANT
SYR 5ML LL (SYRINGE) ×2 IMPLANT
SYR TB 1ML LUER SLIP (SYRINGE) ×2 IMPLANT
WATER STERILE IRR 500ML POUR (IV SOLUTION) ×2 IMPLANT
WIPE NON LINTING 3.25X3.25 (MISCELLANEOUS) ×2 IMPLANT

## 2018-04-12 NOTE — Anesthesia Preprocedure Evaluation (Signed)
Anesthesia Evaluation  Patient identified by MRN, date of birth, ID band Patient awake    Reviewed: Allergy & Precautions, NPO status , Patient's Chart, lab work & pertinent test results  History of Anesthesia Complications Negative for: history of anesthetic complications  Airway Mallampati: I  TM Distance: >3 FB Neck ROM: Full    Dental no notable dental hx.    Pulmonary neg pulmonary ROS,    Pulmonary exam normal        Cardiovascular hypertension, Normal cardiovascular exam Rhythm:Regular Rate:Normal     Neuro/Psych PSYCHIATRIC DISORDERS Anxiety Depression Acoustic neuroma (small, stable, monitored via imaging yearly)    GI/Hepatic GERD  Medicated and Controlled,  Endo/Other  negative endocrine ROS  Renal/GU negative Renal ROS     Musculoskeletal  (+) Arthritis ,   Abdominal   Peds  Hematology negative hematology ROS (+)   Anesthesia Other Findings   Reproductive/Obstetrics                             Anesthesia Physical Anesthesia Plan  ASA: II  Anesthesia Plan: MAC   Post-op Pain Management:    Induction: Intravenous  PONV Risk Score and Plan: 2 and TIVA and Midazolam  Airway Management Planned: Natural Airway  Additional Equipment:   Intra-op Plan:   Post-operative Plan:   Informed Consent: I have reviewed the patients History and Physical, chart, labs and discussed the procedure including the risks, benefits and alternatives for the proposed anesthesia with the patient or authorized representative who has indicated his/her understanding and acceptance.     Plan Discussed with: CRNA  Anesthesia Plan Comments:         Anesthesia Quick Evaluation

## 2018-04-12 NOTE — Anesthesia Postprocedure Evaluation (Signed)
Anesthesia Post Note  Patient: Kathy Long  Procedure(s) Performed: CATARACT EXTRACTION PHACO AND INTRAOCULAR LENS PLACEMENT (IOC)  LEFT TORIC LENS (Left )  Patient location during evaluation: PACU Anesthesia Type: MAC Level of consciousness: awake and alert, oriented and patient cooperative Pain management: pain level controlled Vital Signs Assessment: post-procedure vital signs reviewed and stable Respiratory status: spontaneous breathing, nonlabored ventilation and respiratory function stable Cardiovascular status: blood pressure returned to baseline and stable Postop Assessment: adequate PO intake Anesthetic complications: no    Darrin Nipper

## 2018-04-12 NOTE — Op Note (Signed)
LOCATION:  [facility]   PREOPERATIVE DIAGNOSIS:  Nuclear sclerotic cataract of the left eye.  H25.12  POSTOPERATIVE DIAGNOSIS:  Nuclear sclerotic cataract of the left eye.   PROCEDURE:  Phacoemulsification with Toric posterior chamber intraocular lens placement of the left eye.   LENS:  Implant Name Type Inv. Item Serial No. Manufacturer Lot No. LRB No. Used  Acrysof toric iol Intraocular Lens  [id number] ALCON  Left 1   [id number] 16.0 D Toric intraocular lens with 2.25 diopters of cylindrical power with axis orientation at 17 degrees.   ULTRASOUND TIME:17 % of 1 minutes, 6 seconds.  CDE 13.1   SURGEON:  Wyonia Hough, MD   ANESTHESIA:  Topical with tetracaine drops and 2% Xylocaine jelly, augmented with 1% preservative-free intracameral lidocaine.  COMPLICATIONS:  None.   DESCRIPTION OF PROCEDURE:  The patient was identified in the holding room and transported to the operating suite and placed in the supine position under the operating microscope.  The left eye was identified as the operative eye, and it was prepped and draped in the usual sterile ophthalmic fashion.    A clear-corneal paracentesis incision was made at the 1:30 position.  0.5 ml of preservative-free 1% lidocaine was injected into the anterior chamber. The anterior chamber was filled with Viscoat.  A 2.4 millimeter near clear corneal incision was then made at the 10:30 position.  A cystotome and capsulorrhexis forceps were then used to make a curvilinear capsulorrhexis.  Hydrodissection and hydrodelineation were then performed using balanced salt solution.   Phacoemulsification was then used in stop and chop fashion to remove the lens, nucleus and epinucleus.  The remaining cortex was aspirated using the irrigation and aspiration handpiece.  Provisc viscoelastic was then placed into the capsular bag to distend it for lens placement.  The Verion digital marker was used to align the implant at the intended  axis.   A 16.0 diopter lens was then injected into the capsular bag.  It was rotated clockwise until the axis marks on the lens were approximately 15 degrees in the counterclockwise direction to the intended alignment.  The viscoelastic was aspirated from the eye using the irrigation aspiration handpiece.  Then, a Koch spatula through the sideport incision was used to rotate the lens in a clockwise direction until the axis markings of the intraocular lens were lined up with the Verion alignment.  Balanced salt solution was then used to hydrate the wounds. Cefuroxime 0.1 ml of a 10mg /ml solution was injected into the anterior chamber for a dose of 1 mg of intracameral antibiotic at the completion of the case.    The eye was noted to have a physiologic pressure and there was no wound leak noted.   Timolol and Brimonidine drops were applied to the eye.  The patient was taken to the recovery room in stable condition having had no complications of anesthesia or surgery.  Leslea Vowles 04/12/2018, 10:54 AM

## 2018-04-12 NOTE — Transfer of Care (Signed)
Immediate Anesthesia Transfer of Care Note  Patient: Kathy Long  Procedure(s) Performed: CATARACT EXTRACTION PHACO AND INTRAOCULAR LENS PLACEMENT (IOC)  LEFT TORIC LENS (Left )  Patient Location: PACU  Anesthesia Type: MAC  Level of Consciousness: awake, alert  and patient cooperative  Airway and Oxygen Therapy: Patient Spontanous Breathing and Patient connected to supplemental oxygen  Post-op Assessment: Post-op Vital signs reviewed, Patient's Cardiovascular Status Stable, Respiratory Function Stable, Patent Airway and No signs of Nausea or vomiting  Post-op Vital Signs: Reviewed and stable  Complications: No apparent anesthesia complications

## 2018-04-12 NOTE — Anesthesia Procedure Notes (Signed)
Performed by: Wilkins Elpers, CRNA Pre-anesthesia Checklist: Patient identified, Emergency Drugs available, Suction available, Timeout performed and Patient being monitored Patient Re-evaluated:Patient Re-evaluated prior to induction Oxygen Delivery Method: Nasal cannula Placement Confirmation: positive ETCO2       

## 2018-04-12 NOTE — H&P (Signed)
The History and Physical notes are on paper, have been signed, and are to be scanned. The patient remains stable and unchanged from the H&P.   Previous H&P reviewed, patient examined, and there are no changes.  Kathy Long 04/12/2018 9:59 AM'

## 2018-04-13 ENCOUNTER — Encounter: Payer: Self-pay | Admitting: Internal Medicine

## 2018-04-13 ENCOUNTER — Encounter: Payer: Self-pay | Admitting: Ophthalmology

## 2018-04-13 DIAGNOSIS — N183 Chronic kidney disease, stage 3 unspecified: Secondary | ICD-10-CM

## 2018-04-13 DIAGNOSIS — N1831 Chronic kidney disease, stage 3a: Secondary | ICD-10-CM | POA: Insufficient documentation

## 2018-04-13 HISTORY — DX: Chronic kidney disease, stage 3 unspecified: N18.30

## 2018-05-01 NOTE — Anesthesia Preprocedure Evaluation (Addendum)
Anesthesia Evaluation  Patient identified by MRN, date of birth, ID band Patient awake    Reviewed: Allergy & Precautions, NPO status , Patient's Chart, lab work & pertinent test results  History of Anesthesia Complications Negative for: history of anesthetic complications  Airway Mallampati: II  TM Distance: >3 FB Neck ROM: Full    Dental no notable dental hx.    Pulmonary neg pulmonary ROS,    Pulmonary exam normal breath sounds clear to auscultation       Cardiovascular hypertension, Normal cardiovascular exam Rhythm:Regular Rate:Normal     Neuro/Psych PSYCHIATRIC DISORDERS Anxiety Depression Acoustic neuroma (small, stable, monitored via imaging yearly)    GI/Hepatic GERD  Medicated and Controlled,  Endo/Other  negative endocrine ROS  Renal/GU Renal disease (stage III CKD)     Musculoskeletal  (+) Arthritis ,   Abdominal   Peds  Hematology negative hematology ROS (+)   Anesthesia Other Findings   Reproductive/Obstetrics                            Anesthesia Physical  Anesthesia Plan  ASA: II  Anesthesia Plan: MAC   Post-op Pain Management:    Induction: Intravenous  PONV Risk Score and Plan: 2 and TIVA and Midazolam  Airway Management Planned: Natural Airway  Additional Equipment:   Intra-op Plan:   Post-operative Plan:   Informed Consent: I have reviewed the patients History and Physical, chart, labs and discussed the procedure including the risks, benefits and alternatives for the proposed anesthesia with the patient or authorized representative who has indicated his/her understanding and acceptance.     Plan Discussed with: CRNA  Anesthesia Plan Comments:        Anesthesia Quick Evaluation

## 2018-05-04 ENCOUNTER — Ambulatory Visit: Payer: Medicare Other | Admitting: Podiatry

## 2018-05-04 ENCOUNTER — Ambulatory Visit (INDEPENDENT_AMBULATORY_CARE_PROVIDER_SITE_OTHER): Payer: Medicare Other

## 2018-05-04 ENCOUNTER — Encounter: Payer: Self-pay | Admitting: Podiatry

## 2018-05-04 DIAGNOSIS — Z8781 Personal history of (healed) traumatic fracture: Secondary | ICD-10-CM

## 2018-05-04 DIAGNOSIS — S8262XD Displaced fracture of lateral malleolus of left fibula, subsequent encounter for closed fracture with routine healing: Secondary | ICD-10-CM | POA: Diagnosis not present

## 2018-05-04 DIAGNOSIS — M2011 Hallux valgus (acquired), right foot: Secondary | ICD-10-CM | POA: Diagnosis not present

## 2018-05-04 DIAGNOSIS — Z9889 Other specified postprocedural states: Secondary | ICD-10-CM | POA: Diagnosis not present

## 2018-05-04 DIAGNOSIS — M79671 Pain in right foot: Secondary | ICD-10-CM

## 2018-05-05 NOTE — Discharge Instructions (Signed)

## 2018-05-10 ENCOUNTER — Ambulatory Visit: Payer: Medicare Other | Admitting: Anesthesiology

## 2018-05-10 ENCOUNTER — Ambulatory Visit
Admission: RE | Admit: 2018-05-10 | Discharge: 2018-05-10 | Disposition: A | Discharge status: Home / Self Care | Payer: Medicare Other | Source: Ambulatory Visit | Attending: Ophthalmology | Admitting: Ophthalmology

## 2018-05-10 ENCOUNTER — Encounter
Admission: RE | Disposition: A | Discharge status: Home / Self Care | Payer: Self-pay | Source: Ambulatory Visit | Attending: Ophthalmology

## 2018-05-10 DIAGNOSIS — K219 Gastro-esophageal reflux disease without esophagitis: Secondary | ICD-10-CM | POA: Insufficient documentation

## 2018-05-10 DIAGNOSIS — F329 Major depressive disorder, single episode, unspecified: Secondary | ICD-10-CM | POA: Diagnosis not present

## 2018-05-10 DIAGNOSIS — D333 Benign neoplasm of cranial nerves: Secondary | ICD-10-CM | POA: Insufficient documentation

## 2018-05-10 DIAGNOSIS — H2511 Age-related nuclear cataract, right eye: Secondary | ICD-10-CM | POA: Diagnosis present

## 2018-05-10 DIAGNOSIS — I129 Hypertensive chronic kidney disease with stage 1 through stage 4 chronic kidney disease, or unspecified chronic kidney disease: Secondary | ICD-10-CM | POA: Diagnosis not present

## 2018-05-10 DIAGNOSIS — N183 Chronic kidney disease, stage 3 (moderate): Secondary | ICD-10-CM | POA: Insufficient documentation

## 2018-05-10 DIAGNOSIS — Z79899 Other long term (current) drug therapy: Secondary | ICD-10-CM | POA: Insufficient documentation

## 2018-05-10 DIAGNOSIS — Z85820 Personal history of malignant melanoma of skin: Secondary | ICD-10-CM | POA: Diagnosis not present

## 2018-05-10 DIAGNOSIS — Z9842 Cataract extraction status, left eye: Secondary | ICD-10-CM | POA: Diagnosis not present

## 2018-05-10 DIAGNOSIS — F419 Anxiety disorder, unspecified: Secondary | ICD-10-CM | POA: Insufficient documentation

## 2018-05-10 DIAGNOSIS — E7800 Pure hypercholesterolemia, unspecified: Secondary | ICD-10-CM | POA: Insufficient documentation

## 2018-05-10 DIAGNOSIS — Z888 Allergy status to other drugs, medicaments and biological substances status: Secondary | ICD-10-CM | POA: Diagnosis not present

## 2018-05-10 HISTORY — PX: CATARACT EXTRACTION W/PHACO: SHX586

## 2018-05-10 SURGERY — PHACOEMULSIFICATION, CATARACT, WITH IOL INSERTION
Anesthesia: Monitor Anesthesia Care | Site: Eye | Laterality: Right

## 2018-05-10 MED ORDER — EPINEPHRINE PF 1 MG/ML IJ SOLN
INTRAOCULAR | Status: DC | PRN
  Administered 2018-05-10: 74 mL via OPHTHALMIC

## 2018-05-10 MED ORDER — LIDOCAINE HCL (PF) 2 % IJ SOLN
INTRAOCULAR | Status: DC | PRN
  Administered 2018-05-10: 1 mL

## 2018-05-10 MED ORDER — ARMC OPHTHALMIC DILATING DROPS
1.0000 "application " | OPHTHALMIC | Status: DC | PRN
  Administered 2018-05-10 (×3): 1 via OPHTHALMIC

## 2018-05-10 MED ORDER — TETRACAINE HCL 0.5 % OP SOLN
1.0000 [drp] | OPHTHALMIC | Status: DC | PRN
  Administered 2018-05-10 (×2): 1 [drp] via OPHTHALMIC

## 2018-05-10 MED ORDER — BRIMONIDINE TARTRATE-TIMOLOL 0.2-0.5 % OP SOLN
OPHTHALMIC | Status: DC | PRN
  Administered 2018-05-10: 1 [drp] via OPHTHALMIC

## 2018-05-10 MED ORDER — NA HYALUR & NA CHOND-NA HYALUR 0.4-0.35 ML IO KIT
PACK | INTRAOCULAR | Status: DC | PRN
  Administered 2018-05-10: 1 mL via INTRAOCULAR

## 2018-05-10 MED ORDER — ONDANSETRON HCL 4 MG/2ML IJ SOLN: 4.0000 mg | Freq: Once | INTRAMUSCULAR | Status: DC | PRN

## 2018-05-10 MED ORDER — MOXIFLOXACIN HCL 0.5 % OP SOLN
1.0000 [drp] | OPHTHALMIC | Status: DC | PRN
  Administered 2018-05-10 (×3): 1 [drp] via OPHTHALMIC

## 2018-05-10 MED ORDER — ACETAMINOPHEN 160 MG/5ML PO SOLN: 325.0000 mg | ORAL | Status: DC | PRN

## 2018-05-10 MED ORDER — FENTANYL CITRATE (PF) 100 MCG/2ML IJ SOLN
INTRAMUSCULAR | Status: DC | PRN
  Administered 2018-05-10: 50 ug via INTRAVENOUS

## 2018-05-10 MED ORDER — CEFUROXIME OPHTHALMIC INJECTION 1 MG/0.1 ML
INJECTION | OPHTHALMIC | Status: DC | PRN
  Administered 2018-05-10: 0.1 mL via INTRACAMERAL

## 2018-05-10 MED ORDER — MIDAZOLAM HCL 2 MG/2ML IJ SOLN
INTRAMUSCULAR | Status: DC | PRN
  Administered 2018-05-10: 2 mg via INTRAVENOUS

## 2018-05-10 MED ORDER — ACETAMINOPHEN 325 MG PO TABS: 650.0000 mg | ORAL_TABLET | Freq: Once | ORAL | Status: DC | PRN

## 2018-05-10 SURGICAL SUPPLY — 26 items
ACRYSOF IQ TORIC LENS 13.0 D (Intraocular Lens) ×1 IMPLANT
CANNULA ANT/CHMB 27G (MISCELLANEOUS) ×1 IMPLANT
CANNULA ANT/CHMB 27GA (MISCELLANEOUS) ×2 IMPLANT
GLOVE SURG LX 7.5 STRW (GLOVE) ×1
GLOVE SURG LX STRL 7.5 STRW (GLOVE) ×1 IMPLANT
GLOVE SURG TRIUMPH 8.0 PF LTX (GLOVE) ×2 IMPLANT
GOWN STRL REUS W/ TWL LRG LVL3 (GOWN DISPOSABLE) ×2 IMPLANT
GOWN STRL REUS W/TWL LRG LVL3 (GOWN DISPOSABLE) ×2
MARKER SKIN DUAL TIP RULER LAB (MISCELLANEOUS) ×2 IMPLANT
NDL FILTER BLUNT 18X1 1/2 (NEEDLE) ×1 IMPLANT
NDL RETROBULBAR .5 NSTRL (NEEDLE) IMPLANT
NEEDLE FILTER BLUNT 18X 1/2SAF (NEEDLE) ×1
NEEDLE FILTER BLUNT 18X1 1/2 (NEEDLE) ×1 IMPLANT
PACK CATARACT BRASINGTON (MISCELLANEOUS) ×2 IMPLANT
PACK EYE AFTER SURG (MISCELLANEOUS) ×2 IMPLANT
PACK OPTHALMIC (MISCELLANEOUS) ×2 IMPLANT
RING MALYGIN 7.0 (MISCELLANEOUS) IMPLANT
SUT ETHILON 10-0 CS-B-6CS-B-6 (SUTURE)
SUT VICRYL  9 0 (SUTURE)
SUT VICRYL 9 0 (SUTURE) IMPLANT
SUTURE EHLN 10-0 CS-B-6CS-B-6 (SUTURE) IMPLANT
SYR 3ML LL SCALE MARK (SYRINGE) ×2 IMPLANT
SYR 5ML LL (SYRINGE) ×2 IMPLANT
SYR TB 1ML LUER SLIP (SYRINGE) ×2 IMPLANT
WATER STERILE IRR 500ML POUR (IV SOLUTION) ×2 IMPLANT
WIPE NON LINTING 3.25X3.25 (MISCELLANEOUS) ×2 IMPLANT

## 2018-05-10 NOTE — Transfer of Care (Signed)
Immediate Anesthesia Transfer of Care Note  Patient: Kathy Long  Procedure(s) Performed: CATARACT EXTRACTION PHACO AND INTRAOCULAR LENS PLACEMENT (IOC)  RIGHT TORIC LENS (Right Eye)  Patient Location: PACU  Anesthesia Type: MAC  Level of Consciousness: awake, alert  and patient cooperative  Airway and Oxygen Therapy: Patient Spontanous Breathing and Patient connected to supplemental oxygen  Post-op Assessment: Post-op Vital signs reviewed, Patient's Cardiovascular Status Stable, Respiratory Function Stable, Patent Airway and No signs of Nausea or vomiting  Post-op Vital Signs: Reviewed and stable  Complications: No apparent anesthesia complications

## 2018-05-10 NOTE — Anesthesia Procedure Notes (Signed)
Procedure Name: MAC Date/Time: 05/10/2018 10:32 AM Performed by: Cameron Ali, CRNA Pre-anesthesia Checklist: Patient identified, Emergency Drugs available, Suction available, Timeout performed and Patient being monitored Patient Re-evaluated:Patient Re-evaluated prior to induction Oxygen Delivery Method: Nasal cannula Placement Confirmation: positive ETCO2

## 2018-05-10 NOTE — H&P (Signed)

## 2018-05-10 NOTE — Op Note (Signed)
LOCATION:  [facility]   PREOPERATIVE DIAGNOSIS:  Nuclear sclerotic cataract of the right eye.  H25.11   POSTOPERATIVE DIAGNOSIS:  Nuclear sclerotic cataract of the right eye.   PROCEDURE:  Phacoemulsification with Toric posterior chamber intraocular lens placement of the right eye.   LENS:   Implant Name Type Inv. Item Serial No. Manufacturer Lot No. LRB No. Used  ACRYSOF IQ TORIC LENS 13.0 D Intraocular Lens  [id number] ALCON  Right 1     [id number] 13.0 D Toric intraocular lens with 2.25 diopters of cylindrical power with axis orientation at 103 degrees.   ULTRASOUND TIME: 16 % of 1 minutes, 38 seconds.  CDE 15.5   SURGEON:  Wyonia Hough, MD   ANESTHESIA: Topical with tetracaine drops and 2% Xylocaine jelly, augmented with 1% preservative-free intracameral lidocaine. .   COMPLICATIONS:  None.   DESCRIPTION OF PROCEDURE:  The patient was identified in the holding room and transported to the operating suite and placed in the supine position under the operating microscope.  The right eye was identified as the operative eye, and it was prepped and draped in the usual sterile ophthalmic fashion.    A clear-corneal paracentesis incision was made at the 12:00 position.  0.5 ml of preservative-free 1% lidocaine was injected into the anterior chamber. The anterior chamber was filled with Viscoat.  A 2.4 millimeter near clear corneal incision was then made at the 9:00 position.  A cystotome and capsulorrhexis forceps were then used to make a curvilinear capsulorrhexis.  Hydrodissection and hydrodelineation were then performed using balanced salt solution.   Phacoemulsification was then used in stop and chop fashion to remove the lens, nucleus and epinucleus.  The remaining cortex was aspirated using the irrigation and aspiration handpiece.  Provisc viscoelastic was then placed into the capsular bag to distend it for lens placement.  The Verion digital marker was used to align the  implant at the intended axis.   A Toric lens was then injected into the capsular bag.  It was rotated clockwise until the axis marks on the lens were approximately 15 degrees in the counterclockwise direction to the intended alignment.  The viscoelastic was aspirated from the eye using the irrigation aspiration handpiece.  Then, a Koch spatula through the sideport incision was used to rotate the lens in a clockwise direction until the axis markings of the intraocular lens were lined up with the Verion alignment.  Balanced salt solution was then used to hydrate the wounds. Cefuroxime 0.1 ml of a 10mg /ml solution was injected into the anterior chamber for a dose of 1 mg of intracameral antibiotic at the completion of the case.    The eye was noted to have a physiologic pressure and there was no wound leak noted.   Timolol and Brimonidine drops were applied to the eye.  The patient was taken to the recovery room in stable condition having had no complications of anesthesia or surgery.  Kathy Long 05/10/2018, 10:54 AM

## 2018-05-10 NOTE — Anesthesia Postprocedure Evaluation (Signed)
Anesthesia Post Note  Patient: Kathy Long  Procedure(s) Performed: CATARACT EXTRACTION PHACO AND INTRAOCULAR LENS PLACEMENT (IOC)  RIGHT TORIC LENS (Right Eye)  Patient location during evaluation: PACU Anesthesia Type: MAC Level of consciousness: awake and alert, oriented and patient cooperative Pain management: pain level controlled Vital Signs Assessment: post-procedure vital signs reviewed and stable Respiratory status: spontaneous breathing, nonlabored ventilation and respiratory function stable Cardiovascular status: blood pressure returned to baseline and stable Postop Assessment: adequate PO intake Anesthetic complications: no    Darrin Nipper

## 2018-05-11 ENCOUNTER — Encounter: Payer: Self-pay | Admitting: Ophthalmology

## 2018-06-07 ENCOUNTER — Ambulatory Visit: Payer: Medicare Other | Admitting: Podiatry

## 2018-06-07 ENCOUNTER — Encounter: Payer: Self-pay | Admitting: Podiatry

## 2018-06-07 DIAGNOSIS — M2041 Other hammer toe(s) (acquired), right foot: Secondary | ICD-10-CM

## 2018-06-07 NOTE — Progress Notes (Signed)
She presents today chief concern of the second and third toes on the right foot the crossover.  She states that she would like to see about getting these fixed at some point.  She is not sure exactly when she was to have them done.  Objective: Vital signs are stable she is alert and oriented x3.  Pulses are palpable.  I reviewed her past medical history medications allergies surgery social history review of systems.  Pulses are strongly palpable she has overlapping second and third toes and underlapping fourth toe right foot with a history of bunion repair and first and second metatarsal osteotomies right foot.  Radiographs confirm hammertoe deformities.  Assessment: Hammertoe deformities and hallux interphalangeal him more than likely she would have to have a fusion to toes #2 #3 #4 as well as a Akin osteotomy with a McBride hallux.  Plan: She will follow-up with [place] as needed.

## 2018-06-23 ENCOUNTER — Other Ambulatory Visit: Payer: Self-pay | Admitting: Internal Medicine

## 2018-06-25 NOTE — Progress Notes (Signed)
Subjective:  Patient ID: Kathy Long, female    DOB: 1951-05-13,  MRN: [id number]  Chief Complaint  Patient presents with  . Ankle Injury    left follow up on ORIF w/Fixation DOS 11-30-2017; pt stated, "doing good, no pain today, no new concerns"    DOS: 11/30/17 Procedure: Left ankle ORIF  68 y.o. female returns for surgical f/u.  Doing very well no pain or concerns about the ankle having some pain in her right foot bunion that she would like to discuss possible surgical correction for.  Review of Systems: Negative except as noted in the HPI. Denies N/V/F/Ch.  Past Medical History:  Diagnosis Date  . Acoustic neuroma (HCC)    left ear  . Arthritis    lower back, hands  . Cancer (HCC)    melanoma, shoulder  . Depression   . GERD (gastroesophageal reflux disease)   . Hyperlipidemia   . Hypertension   . 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     Current Outpatient Medications:  .  amLODipine (NORVASC) 5 MG tablet, Take 1 tablet (5 mg total) by mouth daily., Disp: 90 tablet, Rfl: 0 .  b complex vitamins tablet, Take 1 tablet by mouth daily., Disp: , Rfl:  .  Cholecalciferol 1000 UNITS tablet, Take 1,000 Units by mouth 2 (two) times daily., Disp: , Rfl:  .  clobetasol (TEMOVATE) 0.05 % external solution, APPLY TOPICALLY EVERY DAY TIL RASH IS CLEAR, Disp: 50 mL, Rfl: 3 .  HYDROcodone-homatropine (HYCODAN) 5-1.5 MG/5ML syrup, Take 5 mLs by mouth every 6 (six) hours as needed for cough., Disp: , Rfl:  .  Melatonin 3 MG CAPS, Take 3 mg by mouth at bedtime., Disp: , Rfl:  .  metoprolol succinate (TOPROL-XL) 25 MG 24 hr tablet, Take 1 tablet (25 mg total) by mouth daily. Take with or immediately following a meal., Disp: 90 tablet, Rfl: 3 .  omeprazole (PRILOSEC) 40 MG capsule, TAKE 1 CAPSULE BY MOUTH EVERY DAY, Disp: 90 capsule, Rfl: 1 .  rosuvastatin (CRESTOR) 10 MG tablet, TAKE ONE TABLET EVERY DAY, Disp: 90 tablet, Rfl: 1 .  vitamin E 400 UNIT capsule, Take 400 Units by mouth daily., Disp:  , Rfl:  .  acetaminophen (TYLENOL) 325 MG tablet, Take 650 mg by mouth daily., Disp: , Rfl:  .  benzonatate (TESSALON) 100 MG capsule, Take by mouth 3 (three) times daily as needed for cough., Disp: , Rfl:  .  citalopram (CELEXA) 20 MG tablet, TAKE ONE TABLET BY MOUTH EVERY DAY, Disp: 90 tablet, Rfl: 1 .  predniSONE (DELTASONE) 10 MG tablet, Take 5 mg by mouth 2 (two) times daily with a meal., Disp: , Rfl:   Social History   Tobacco Use  Smoking Status Never Smoker  Smokeless Tobacco Never Used  Tobacco Comment   smoked socially in college    Allergies  Allergen Reactions  . Morphine And Related Other (See Comments)    Chest pain  . Tramadol Other (See Comments)    tremors  . Tape Rash        Objective:  There were no vitals filed for this visit. There is no height or weight on file to calculate BMI. Constitutional Well developed. Well nourished.  Vascular Foot warm and well perfused. Capillary refill normal to all digits.   Neurologic Normal speech. Oriented to person, place, and time. Epicritic sensation to light touch grossly present bilaterally.  Dermatologic Skin well-healed.  Orthopedic: No tenderness to palpation noted about the surgical  site. Good ankle ROM noted.  No pain or crepitus HAV deformity right.   Radiographs: Taken reviewed.  Fracture healed good alignment noted Assessment:   1. Status post ORIF of fracture of ankle   2. Hallux valgus (acquired), right foot   3. Pain in right foot    Plan:  Patient was evaluated and treated and all questions answered.  S/p foot surgery left -XR reviewed healing of fracture noted with good position. -Well healed ankle   Right foot bunion -Would like to discuss possible surgical revision will refer for follow-up with Dr. Milinda Pointer for evaluation as she did his original surgery and he referred her for evaluation of the ankle  No follow-ups on file.

## 2018-07-24 ENCOUNTER — Other Ambulatory Visit: Payer: Self-pay | Admitting: Internal Medicine

## 2018-07-31 ENCOUNTER — Ambulatory Visit: Payer: Medicare Other | Admitting: Internal Medicine

## 2018-08-02 ENCOUNTER — Telehealth: Payer: Self-pay | Admitting: *Deleted

## 2018-08-02 NOTE — Telephone Encounter (Signed)
Copied from [place] [id number]. Topic: Referral - Status >> Aug 02, 2018 10:39 AM Scherrie Gerlach wrote: Reason for CRM: pt has appt today with Dr Holley Raring in [facility] today at 1:30 pm.  Pt would like Dr Derrel Nip to send Dr Holley Raring the info she needs him to discuss with him (lab resulfs, office notes etc) Pt got to thinking she is not sure what to tell him and no info was sent over there.

## 2018-08-03 NOTE — Telephone Encounter (Signed)
Records faxed.

## 2018-08-04 ENCOUNTER — Other Ambulatory Visit: Payer: Self-pay | Admitting: Nephrology

## 2018-08-04 DIAGNOSIS — N183 Chronic kidney disease, stage 3 unspecified: Secondary | ICD-10-CM

## 2018-08-08 ENCOUNTER — Ambulatory Visit: Payer: Medicare Other | Admitting: Internal Medicine

## 2018-08-17 ENCOUNTER — Other Ambulatory Visit: Payer: Self-pay

## 2018-08-17 ENCOUNTER — Ambulatory Visit
Admission: RE | Admit: 2018-08-17 | Discharge: 2018-08-17 | Disposition: A | Discharge status: Home / Self Care | Payer: Medicare Other | Source: Ambulatory Visit | Attending: Nephrology | Admitting: Nephrology

## 2018-08-17 DIAGNOSIS — N183 Chronic kidney disease, stage 3 unspecified: Secondary | ICD-10-CM

## 2018-08-22 ENCOUNTER — Other Ambulatory Visit: Payer: Self-pay

## 2018-08-22 ENCOUNTER — Other Ambulatory Visit: Payer: Self-pay | Admitting: Nephrology

## 2018-08-22 ENCOUNTER — Encounter: Payer: Self-pay | Admitting: Internal Medicine

## 2018-08-22 ENCOUNTER — Ambulatory Visit: Payer: Medicare Other | Admitting: Internal Medicine

## 2018-08-22 VITALS — BP 150/78 | HR 55 | Temp 97.9°F | Resp 15 | Ht 62.0 in | Wt 173.8 lb

## 2018-08-22 DIAGNOSIS — R7303 Prediabetes: Secondary | ICD-10-CM

## 2018-08-22 DIAGNOSIS — K432 Incisional hernia without obstruction or gangrene: Secondary | ICD-10-CM

## 2018-08-22 DIAGNOSIS — N1330 Unspecified hydronephrosis: Secondary | ICD-10-CM

## 2018-08-22 DIAGNOSIS — I150 Renovascular hypertension: Secondary | ICD-10-CM

## 2018-08-22 DIAGNOSIS — I10 Essential (primary) hypertension: Secondary | ICD-10-CM

## 2018-08-22 DIAGNOSIS — F330 Major depressive disorder, recurrent, mild: Secondary | ICD-10-CM

## 2018-08-22 NOTE — Patient Instructions (Signed)
I am referring you for  Renal artery ultrasound..  This will assess the blood flow to the kidneys and rule out any stenoses that may impact the control of your blood pressure     Please check your blood pressure a few times at home and send me the readings so I can determine if you need a change in medication
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
## 2018-08-23 ENCOUNTER — Encounter: Payer: Self-pay | Admitting: Internal Medicine

## 2018-08-23 LAB — POCT GLYCOSYLATED HEMOGLOBIN (HGB A1C): Hemoglobin A1C: 5.5 % (ref 4.0–5.6)

## 2018-08-23 NOTE — Assessment & Plan Note (Signed)
she reports compliance with medication regimen  but has an elevated reading today in office.  She is not using NSAIDs daily.  Discussed goal of 120/70  (130/80 for patients over 70)  to preserve renal function.  She has been asked to check her  BP  at home and  submit readings for evaluation. Renal function, electrolytes and screen for proteinuria are all normal .Renal artery ultrasound ordered

## 2018-08-23 NOTE — Assessment & Plan Note (Signed)
Symptoms are gone but she has stress, advised to continue citalopram for now,  Discussed weaning down the road

## 2018-08-23 NOTE — Assessment & Plan Note (Signed)
She continues to defer surgery given her last complication . Advised to avoid exercises that increase pressure on her abdominal wall.

## 2018-08-28 ENCOUNTER — Other Ambulatory Visit: Payer: Self-pay

## 2018-08-28 ENCOUNTER — Ambulatory Visit
Admission: RE | Admit: 2018-08-28 | Discharge: 2018-08-28 | Disposition: A | Discharge status: Home / Self Care | Payer: Medicare Other | Source: Ambulatory Visit | Attending: Nephrology | Admitting: Nephrology

## 2018-08-28 DIAGNOSIS — N1330 Unspecified hydronephrosis: Secondary | ICD-10-CM | POA: Insufficient documentation

## 2018-09-09 ENCOUNTER — Other Ambulatory Visit: Payer: Self-pay | Admitting: Internal Medicine

## 2018-09-20 ENCOUNTER — Other Ambulatory Visit: Payer: Self-pay | Admitting: Internal Medicine

## 2018-12-12 ENCOUNTER — Other Ambulatory Visit: Payer: Self-pay | Admitting: Internal Medicine

## 2019-02-07 ENCOUNTER — Other Ambulatory Visit: Payer: Self-pay | Admitting: Internal Medicine

## 2019-02-27 ENCOUNTER — Other Ambulatory Visit: Payer: Self-pay

## 2019-02-28 ENCOUNTER — Other Ambulatory Visit: Payer: Self-pay | Admitting: Internal Medicine

## 2019-02-28 ENCOUNTER — Other Ambulatory Visit: Payer: Self-pay

## 2019-02-28 ENCOUNTER — Ambulatory Visit (INDEPENDENT_AMBULATORY_CARE_PROVIDER_SITE_OTHER): Payer: Medicare Other | Admitting: Internal Medicine

## 2019-02-28 ENCOUNTER — Encounter: Payer: Self-pay | Admitting: Internal Medicine

## 2019-02-28 VITALS — BP 150/90 | HR 59 | Temp 96.8°F | Resp 15 | Ht 62.0 in | Wt 175.0 lb

## 2019-02-28 DIAGNOSIS — R5383 Other fatigue: Secondary | ICD-10-CM

## 2019-02-28 DIAGNOSIS — I10 Essential (primary) hypertension: Secondary | ICD-10-CM | POA: Diagnosis not present

## 2019-02-28 DIAGNOSIS — R7303 Prediabetes: Secondary | ICD-10-CM | POA: Diagnosis not present

## 2019-02-28 DIAGNOSIS — F330 Major depressive disorder, recurrent, mild: Secondary | ICD-10-CM | POA: Diagnosis not present

## 2019-02-28 DIAGNOSIS — R351 Nocturia: Secondary | ICD-10-CM

## 2019-02-28 DIAGNOSIS — Z1231 Encounter for screening mammogram for malignant neoplasm of breast: Secondary | ICD-10-CM

## 2019-02-28 LAB — CBC WITH DIFFERENTIAL/PLATELET
Basophils Absolute: 0.1 10*3/uL (ref 0.0–0.1)
Basophils Relative: 1.3 % (ref 0.0–3.0)
Eosinophils Absolute: 0.2 10*3/uL (ref 0.0–0.7)
Eosinophils Relative: 4.4 % (ref 0.0–5.0)
HCT: 41.7 % (ref 36.0–46.0)
Hemoglobin: 13.8 g/dL (ref 12.0–15.0)
Lymphocytes Relative: 26.5 % (ref 12.0–46.0)
Lymphs Abs: 1.4 10*3/uL (ref 0.7–4.0)
MCHC: 33.1 g/dL (ref 30.0–36.0)
MCV: 94.8 fl (ref 78.0–100.0)
Monocytes Absolute: 0.6 10*3/uL (ref 0.1–1.0)
Monocytes Relative: 10.2 % (ref 3.0–12.0)
Neutro Abs: 3.1 10*3/uL (ref 1.4–7.7)
Neutrophils Relative %: 57.6 % (ref 43.0–77.0)
Platelets: 206 10*3/uL (ref 150.0–400.0)
RBC: 4.4 Mil/uL (ref 3.87–5.11)
RDW: 13 % (ref 11.5–15.5)
WBC: 5.5 10*3/uL (ref 4.0–10.5)

## 2019-02-28 LAB — COMPREHENSIVE METABOLIC PANEL
ALT: 18 U/L (ref 0–35)
AST: 15 U/L (ref 0–37)
Albumin: 4.4 g/dL (ref 3.5–5.2)
Alkaline Phosphatase: 83 U/L (ref 39–117)
BUN: 26 mg/dL — ABNORMAL HIGH (ref 6–23)
CO2: 26 mEq/L (ref 19–32)
Calcium: 9.9 mg/dL (ref 8.4–10.5)
Chloride: 106 mEq/L (ref 96–112)
Creatinine, Ser: 1.21 mg/dL — ABNORMAL HIGH (ref 0.40–1.20)
GFR: 43.9 mL/min — ABNORMAL LOW (ref >60.00)
Glucose, Bld: 90 mg/dL (ref 70–99)
Potassium: 4 mEq/L (ref 3.5–5.1)
Sodium: 143 mEq/L (ref 135–145)
Total Bilirubin: 1.1 mg/dL (ref 0.2–1.2)
Total Protein: 6.6 g/dL (ref 6.0–8.3)

## 2019-02-28 LAB — HEMOGLOBIN A1C: Hgb A1c MFr Bld: 5.8 % (ref 4.6–6.5)

## 2019-02-28 LAB — TSH: TSH: 1.27 u[IU]/mL (ref 0.35–4.50)

## 2019-02-28 MED ORDER — HYDROCHLOROTHIAZIDE 25 MG PO TABS: 25.0000 mg | ORAL_TABLET | Freq: Every day | ORAL | 3 refills | Status: DC

## 2019-02-28 MED ORDER — BUPROPION HCL 75 MG PO TABS: 75.0000 mg | ORAL_TABLET | Freq: Two times a day (BID) | ORAL | 1 refills | Status: DC

## 2019-02-28 NOTE — Patient Instructions (Signed)
I agree with the sleep study!  starting wellbutrin for depressed mood.  75 mg twice daily (morning and mid afternoon)  Starting hctz to improve blood pressure .  Continue amlodipine and toprol   MAKE YOURSELF WALK DAILY  WATCH THE MOVIE ;  (THE SOCIAL DILEMMA) . THESE GUYS ARE ON THE RIGHT TRACK BUT HAVEN'T ACKNOWLEDGED WHO THE ("PRINCIPALITY OF THE AIR IS)

## 2019-02-28 NOTE — Progress Notes (Signed)
Subjective:  Patient ID: Kathy Long, female    DOB: [birth year]  Age: 68 y.o. MRN: [id number]  CC: The primary encounter diagnosis was Fatigue, unspecified type. Diagnoses of Prediabetes, Mild episode of recurrent major depressive disorder ([place]), Essential hypertension, and Nocturia were also pertinent to this visit.  HPI Kathy Long presents for 6 month follow up on hypertension, prediabetes and overweight  She has been having nocturia 5 to 12 times per night .  And using 6 mg melatonin for insomnia.  Sleeps until 6 am, but  too tired to get out of bed,  Then has to empty  bladder every 30 mintues until 9 am when she gets out of bed  Went to see urogyn Dr Jerline Pain at [facility] in [place] . Recommended sleep study ,  Patient -deferred initially but has agreed  Given how poorly she feels.   Has not worked since May,   No longer exercising. Sedentary.  Wakes up in the morning sad,  Takes her a while  To perkup.  Volunteers at her church. Prays in the morning  Family strife still present but to a lesser degree,  Son and  Daughter were  at odds with her over assistance she gave to her  son Kathy Long ex wife  By managing the sale of her house .  Family felt she should have shunned her.    .    Outpatient Medications Prior to Visit  Medication Sig Dispense Refill  . acetaminophen (TYLENOL) 325 MG tablet Take 650 mg by mouth daily.    Marland Kitchen amLODipine (NORVASC) 5 MG tablet TAKE ONE TABLET BY MOUTH EVERY DAY 90 tablet 1  . b complex vitamins tablet Take 1 tablet by mouth daily.    . Cholecalciferol 1000 UNITS tablet Take 1,000 Units by mouth 2 (two) times daily.    . citalopram (CELEXA) 20 MG tablet TAKE ONE TABLET BY MOUTH EVERY DAY 90 tablet 1  . clobetasol (TEMOVATE) 0.05 % external solution APPLY TOPICALLY EVERY DAY TIL RASH IS CLEAR 50 mL 3  . Melatonin 3 MG CAPS Take 3 mg by mouth at bedtime.    . metoprolol succinate (TOPROL-XL) 25 MG 24 hr tablet TAKE ONE TABLET BY MOUTH EVERY DAY TAKE WITH  OR IMMEDIATELY FOLLOWINGA MEAL 90 tablet 1  . omeprazole (PRILOSEC) 40 MG capsule TAKE 1 CAPSULE EVERY DAY 90 capsule 1  . rosuvastatin (CRESTOR) 10 MG tablet TAKE ONE TABLET EVERY DAY 90 tablet 1  . benzonatate (TESSALON) 100 MG capsule Take by mouth 3 (three) times daily as needed for cough.    Marland Kitchen HYDROcodone-homatropine (HYCODAN) 5-1.5 MG/5ML syrup Take 5 mLs by mouth every 6 (six) hours as needed for cough.    . metoprolol succinate (TOPROL-XL) 50 MG 24 hr tablet TAKE ONE TABLET BY MOUTH EVERY DAY (Patient not taking: Reported on 02/28/2019) 90 tablet 1  . predniSONE (DELTASONE) 10 MG tablet Take 5 mg by mouth 2 (two) times daily with a meal.    . vitamin E 400 UNIT capsule Take 400 Units by mouth daily.     No facility-administered medications prior to visit.     Review of Systems;  Patient denies headache, fevers, malaise, unintentional weight loss, skin rash, eye pain, sinus congestion and sinus pain, sore throat, dysphagia,  hemoptysis , cough, dyspnea, wheezing, chest pain, palpitations, orthopnea, edema, abdominal pain, nausea, melena, diarrhea, constipation, flank pain, dysuria, hematuria, urinary  Frequency, nocturia, numbness, tingling, seizures,  Focal weakness, Loss of consciousness,  Tremor, insomnia, depression, anxiety, and suicidal ideation.      Objective:  BP (!) 150/90 (BP Location: Left Arm, Patient Position: Sitting, Cuff Size: Normal)   Pulse (!) 59   Temp (!) 96.8 F (36 C) (Temporal)   Resp 15   Ht 5\' 2"  (1.575 m)   Wt 175 lb (79.4 kg)   SpO2 98%   BMI 32.01 kg/m   BP Readings from Last 3 Encounters:  02/28/19 (!) 150/90  08/22/18 (!) 150/78  05/10/18 134/84    Wt Readings from Last 3 Encounters:  02/28/19 175 lb (79.4 kg)  08/22/18 173 lb 12.8 oz (78.8 kg)  05/10/18 171 lb (77.6 kg)    General appearance: alert, cooperative and appears stated age Ears: normal TM's and external ear canals both ears Throat: lips, mucosa, and tongue normal; teeth and  gums normal Neck: no adenopathy, no carotid bruit, supple, symmetrical, trachea midline and thyroid not enlarged, symmetric, no tenderness/mass/nodules Back: symmetric, no curvature. ROM normal. No CVA tenderness. Lungs: clear to auscultation bilaterally Heart: regular rate and rhythm, S1, S2 normal, no murmur, click, rub or gallop Abdomen: soft, non-tender; bowel sounds normal; no masses,  no organomegaly Pulses: 2+ and symmetric Skin: Skin color, texture, turgor normal. No rashes or lesions Lymph nodes: Cervical, supraclavicular, and axillary nodes normal.  Lab Results  Component Value Date   HGBA1C 5.8 02/28/2019   HGBA1C 5.5 08/23/2018   HGBA1C 5.4 03/09/2018    Lab Results  Component Value Date   CREATININE 1.21 (H) 02/28/2019   CREATININE 1.06 04/10/2018   CREATININE 1.23 (H) 03/09/2018    Lab Results  Component Value Date   WBC 5.5 02/28/2019   HGB 13.8 02/28/2019   HCT 41.7 02/28/2019   PLT 206.0 02/28/2019   GLUCOSE 90 02/28/2019   CHOL 128 11/28/2017   TRIG 139.0 11/28/2017   HDL 35.80 (L) 11/28/2017   LDLCALC 65 11/28/2017   ALT 18 02/28/2019   AST 15 02/28/2019   NA 143 02/28/2019   K 4.0 02/28/2019   CL 106 02/28/2019   CREATININE 1.21 (H) 02/28/2019   BUN 26 (H) 02/28/2019   CO2 26 02/28/2019   TSH 1.27 02/28/2019   INR 0.91 10/23/2009   HGBA1C 5.8 02/28/2019    Ct Abdomen Pelvis Wo Contrast  Result Date: 08/28/2018 CLINICAL DATA:  History of hypertension. Hydronephrosis seen on recent renal ultrasound. Chronic renal disease. EXAM: CT ABDOMEN AND PELVIS WITHOUT CONTRAST TECHNIQUE: Multidetector CT imaging of the abdomen and pelvis was performed following the standard protocol without IV contrast. COMPARISON:  Renal ultrasound 08/17/2018. CT abdomen and pelvis 08/19/2017 07/08/2017 and 10/02/2014. FINDINGS: Lower chest: No pleural or pericardial effusion. Heart size is normal. Lung bases are clear. Hepatobiliary: No focal liver abnormality is seen.  Status post cholecystectomy. No biliary dilatation. Pancreas: Unremarkable. No pancreatic ductal dilatation or surrounding inflammatory changes. Spleen: Normal in size without focal abnormality. Adrenals/Urinary Tract: The adrenal glands appear normal. Mild fullness of the intrarenal collecting systems is unchanged in appearance since 2016. No urinary tract stones are identified. Urinary bladder appears normal. Stomach/Bowel: Status post appendectomy. Sigmoid diverticulosis without diverticulitis is unchanged. The stomach and small bowel appear normal. Vascular/Lymphatic: Aortic atherosclerosis. No enlarged abdominal or pelvic lymph nodes. Reproductive: Uterus and bilateral adnexa are unremarkable. Other: Small fat containing supraumbilical hernia is unchanged. Musculoskeletal: No acute or focal abnormality. Convex left lumbar scoliosis and degenerative disc disease most notable L2-3 and L5-S1. IMPRESSION: No acute abnormality. Mild fullness of the intrarenal collecting systems is  unchanged since 2016. Negative for hydronephrosis. Sigmoid diverticulosis without diverticulitis. Atherosclerosis. Electronically Signed   By: Inge Rise M.D.   On: 08/28/2018 12:56    Assessment & Plan:   Problem List Items Addressed This Visit      Unprioritized   Essential hypertension    Not at goal.  Adding hctz 25 mg in the am       Relevant Medications   hydrochlorothiazide (HYDRODIURIL) 25 MG tablet   Major depressive disorder, 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[place])    Trial of wellbutrin offered and accepted      Relevant Medications   buPROPion (WELLBUTRIN) 75 MG tablet   Nocturia    Excessive,  Suspected etiology OSA       Other Visit Diagnoses    Fatigue, unspecified type    -  Primary   Relevant Orders   Comprehensive metabolic panel (Completed)   TSH (Completed)   CBC with Differential/Platelet (Completed)   Prediabetes       Relevant Orders   Hemoglobin A1c (Completed)      I have discontinued  Kathy Long "Debbie"'s vitamin E, HYDROcodone-homatropine, predniSONE, and benzonatate. I am also having her start on buPROPion and hydrochlorothiazide. Additionally, I am having her maintain her Cholecalciferol, Melatonin, b complex vitamins, clobetasol, acetaminophen, omeprazole, rosuvastatin, citalopram, amLODipine, and metoprolol succinate.  Meds ordered this encounter  Medications  . buPROPion (WELLBUTRIN) 75 MG tablet    Sig: Take 1 tablet (75 mg total) by mouth 2 (two) times daily.    Dispense:  60 tablet    Refill:  1  . hydrochlorothiazide (HYDRODIURIL) 25 MG tablet    Sig: Take 1 tablet (25 mg total) by mouth daily.    Dispense:  90 tablet    Refill:  3    Medications Discontinued During This Encounter  Medication Reason  . benzonatate (TESSALON) 100 MG capsule Error  . HYDROcodone-homatropine (HYCODAN) 5-1.5 MG/5ML syrup Error  . metoprolol succinate (TOPROL-XL) 50 MG 24 hr tablet Error  . predniSONE (DELTASONE) 10 MG tablet Error  . vitamin E 400 UNIT capsule Error    Follow-up: No follow-ups on file.   [name], MD

## 2019-03-01 NOTE — Assessment & Plan Note (Signed)
Not at goal.  Adding hctz 25 mg in the am

## 2019-03-01 NOTE — Assessment & Plan Note (Signed)
Excessive,  Suspected etiology OSA

## 2019-03-01 NOTE — Assessment & Plan Note (Signed)
Trial of wellbutrin offered and accepted

## 2019-03-05 ENCOUNTER — Other Ambulatory Visit: Payer: Self-pay | Admitting: Internal Medicine

## 2019-04-02 ENCOUNTER — Other Ambulatory Visit: Payer: Self-pay | Admitting: Internal Medicine

## 2019-04-02 DIAGNOSIS — R5382 Chronic fatigue, unspecified: Secondary | ICD-10-CM

## 2019-04-02 DIAGNOSIS — R0683 Snoring: Secondary | ICD-10-CM

## 2019-04-02 DIAGNOSIS — I10 Essential (primary) hypertension: Secondary | ICD-10-CM

## 2019-04-02 NOTE — Progress Notes (Signed)
Sleep study ordered

## 2019-04-03 ENCOUNTER — Other Ambulatory Visit: Payer: Self-pay | Admitting: Internal Medicine

## 2019-04-09 ENCOUNTER — Other Ambulatory Visit: Payer: Self-pay | Admitting: Internal Medicine

## 2019-04-13 ENCOUNTER — Ambulatory Visit
Admission: RE | Admit: 2019-04-13 | Discharge: 2019-04-13 | Disposition: A | Discharge status: Home / Self Care | Payer: Medicare Other | Source: Ambulatory Visit | Attending: Internal Medicine | Admitting: Internal Medicine

## 2019-04-13 DIAGNOSIS — Z1231 Encounter for screening mammogram for malignant neoplasm of breast: Secondary | ICD-10-CM | POA: Insufficient documentation

## 2019-05-03 ENCOUNTER — Ambulatory Visit: Payer: Medicare Other | Attending: Neurology

## 2019-05-03 DIAGNOSIS — F5101 Primary insomnia: Secondary | ICD-10-CM | POA: Insufficient documentation

## 2019-05-03 DIAGNOSIS — G4733 Obstructive sleep apnea (adult) (pediatric): Secondary | ICD-10-CM | POA: Insufficient documentation

## 2019-05-04 ENCOUNTER — Other Ambulatory Visit: Payer: Self-pay

## 2019-05-11 DIAGNOSIS — G4733 Obstructive sleep apnea (adult) (pediatric): Secondary | ICD-10-CM

## 2019-05-13 DIAGNOSIS — G4733 Obstructive sleep apnea (adult) (pediatric): Secondary | ICD-10-CM

## 2019-05-13 HISTORY — DX: Obstructive sleep apnea (adult) (pediatric): G47.33

## 2019-05-13 NOTE — Telephone Encounter (Signed)
You have  moderate to severe sleep apnea and a cpap titration study is needed,  Which I have ordered  Please  I will have Kathy Long mail you  a copy of the last two summary pages.

## 2019-05-17 ENCOUNTER — Telehealth: Payer: Self-pay | Admitting: Internal Medicine

## 2019-05-17 DIAGNOSIS — G4733 Obstructive sleep apnea (adult) (pediatric): Secondary | ICD-10-CM

## 2019-05-30 ENCOUNTER — Telehealth: Payer: Self-pay | Admitting: Internal Medicine

## 2019-05-30 MED ORDER — ONDANSETRON 4 MG PO TBDP: 4.0000 mg | ORAL_TABLET | Freq: Three times a day (TID) | ORAL | 0 refills | Status: DC | PRN

## 2019-05-30 NOTE — Telephone Encounter (Signed)
Pt husband tested positive for Covid and she tested negative  Pt is having diarrhea ever 20 mins, headache, cough  Pt wants an appt with Dr. Derrel Nip  Pt is scared that she will go into Kidney failure

## 2019-05-30 NOTE — Telephone Encounter (Signed)
Her test was likely a false negative result,  Meaning she HAS  Covid. SHE SHOULD BE QUARANTINING.  As long as she can drink plenty of fluids,  She should not go into kidney failure.  She can take immodium for the diarrhea  And I will call in zofran for the nausea   Tell her son to pick up a pulse oximeter to check their oxygen levels.  if they drop below 90%   they need to go to the ER

## 2019-05-30 NOTE — Telephone Encounter (Signed)
Spoke with pt and she stated that her and her husband were both exposed to covid on 05/21/2019 at a birthday party. Her husband developed symptoms on around Christmas so they both went and got tested on 05/26/2019. Husband came back positive but pt came back negative. Pt started developing sinus like symptoms then she started having diarrhea every 20 minutes, headache, congestion, fatigue, and nausea. She stated that the diarrhea seems to have gotten better today and she has not had a bowel movement in the last 3 hours. Pt stated that her son brought her some gatorade, a drink called clean hydration that he uses after running to replenish his electrolytes and he has also order the pt and husband some chicken noodle soup through [employer] because she has not been able to eat anything for the last two days. Pt is also taking zinc, vitamin c and vitamin d. Pt is concerned because the last time she had a virus she ended up in the ED in kidney failure and had to have fluids. Pt is just wanting to make sure that she doesn't get to that again.

## 2019-05-30 NOTE — Telephone Encounter (Signed)
Spoke with pt and informed her of Dr. Lupita Dawn message below. Pt gave a verbal understanding and stated that they have been checking there O2 and it has not dropped below 95%.

## 2019-06-07 ENCOUNTER — Telehealth: Payer: Self-pay | Admitting: Internal Medicine

## 2019-06-07 ENCOUNTER — Telehealth: Payer: Self-pay

## 2019-06-07 DIAGNOSIS — Z1152 Encounter for screening for COVID-19: Secondary | ICD-10-CM

## 2019-06-07 NOTE — Telephone Encounter (Signed)
She recently had an illness suspected to be COVID since her daughter and husband were positive, even though she  had negative result .  I believe the sleep study center may have a policy . So they should be notified and given the choice of postponing her test

## 2019-06-07 NOTE — Telephone Encounter (Signed)
Covid test orders for screening. Pt is scheduled for a sleep study on 06/28/2019. Placed in quick sign folder.

## 2019-06-10 ENCOUNTER — Other Ambulatory Visit: Payer: Self-pay | Admitting: Internal Medicine

## 2019-06-11 DIAGNOSIS — Z20828 Contact with and (suspected) exposure to other viral communicable diseases: Secondary | ICD-10-CM | POA: Diagnosis not present

## 2019-06-11 NOTE — Telephone Encounter (Signed)
Spoke with Sh[facility] L[facility] at [facility] and she stated that the pt is having the titration study done and they require the pt to have a covid test done 2 days prior to there study and the result must be negative.

## 2019-06-26 ENCOUNTER — Other Ambulatory Visit
Admission: RE | Admit: 2019-06-26 | Discharge: 2019-06-26 | Disposition: A | Discharge status: Home / Self Care | Payer: Medicare PPO | Source: Ambulatory Visit | Attending: Internal Medicine | Admitting: Internal Medicine

## 2019-06-26 ENCOUNTER — Other Ambulatory Visit: Payer: Self-pay

## 2019-06-26 DIAGNOSIS — Z01812 Encounter for preprocedural laboratory examination: Secondary | ICD-10-CM | POA: Diagnosis not present

## 2019-06-26 DIAGNOSIS — U071 COVID-19: Secondary | ICD-10-CM | POA: Insufficient documentation

## 2019-06-27 ENCOUNTER — Telehealth: Payer: Self-pay | Admitting: Internal Medicine

## 2019-06-27 LAB — SARS CORONAVIRUS 2 (TAT 6-24 HRS): SARS Coronavirus 2: POSITIVE — AB

## 2019-07-06 ENCOUNTER — Other Ambulatory Visit: Payer: Self-pay | Admitting: Internal Medicine

## 2019-07-06 NOTE — Telephone Encounter (Signed)
Refilled: 12/19/2017 Last OV: 02/28/2019 Next OV: not scheduled

## 2019-07-10 ENCOUNTER — Ambulatory Visit: Payer: Medicare PPO | Attending: Neurology

## 2019-07-11 DIAGNOSIS — D485 Neoplasm of uncertain behavior of skin: Secondary | ICD-10-CM | POA: Diagnosis not present

## 2019-07-11 DIAGNOSIS — Z85828 Personal history of other malignant neoplasm of skin: Secondary | ICD-10-CM | POA: Diagnosis not present

## 2019-07-11 DIAGNOSIS — L570 Actinic keratosis: Secondary | ICD-10-CM | POA: Diagnosis not present

## 2019-07-11 DIAGNOSIS — D2261 Melanocytic nevi of right upper limb, including shoulder: Secondary | ICD-10-CM | POA: Diagnosis not present

## 2019-07-11 DIAGNOSIS — D2272 Melanocytic nevi of left lower limb, including hip: Secondary | ICD-10-CM | POA: Diagnosis not present

## 2019-07-11 DIAGNOSIS — D0361 Melanoma in situ of right upper limb, including shoulder: Secondary | ICD-10-CM | POA: Diagnosis not present

## 2019-07-11 DIAGNOSIS — D225 Melanocytic nevi of trunk: Secondary | ICD-10-CM | POA: Diagnosis not present

## 2019-07-11 DIAGNOSIS — Z85820 Personal history of malignant melanoma of skin: Secondary | ICD-10-CM | POA: Diagnosis not present

## 2019-07-11 DIAGNOSIS — L821 Other seborrheic keratosis: Secondary | ICD-10-CM | POA: Diagnosis not present

## 2019-07-12 ENCOUNTER — Other Ambulatory Visit: Payer: Self-pay | Admitting: Internal Medicine

## 2019-07-12 DIAGNOSIS — G4733 Obstructive sleep apnea (adult) (pediatric): Secondary | ICD-10-CM

## 2019-07-21 ENCOUNTER — Other Ambulatory Visit: Payer: Self-pay | Admitting: Internal Medicine

## 2019-07-21 DIAGNOSIS — M25511 Pain in right shoulder: Secondary | ICD-10-CM

## 2019-07-21 DIAGNOSIS — M5442 Lumbago with sciatica, left side: Secondary | ICD-10-CM

## 2019-07-21 DIAGNOSIS — G8929 Other chronic pain: Secondary | ICD-10-CM

## 2019-07-23 DIAGNOSIS — Z20828 Contact with and (suspected) exposure to other viral communicable diseases: Secondary | ICD-10-CM | POA: Diagnosis not present

## 2019-08-15 ENCOUNTER — Ambulatory Visit: Payer: Medicare PPO | Admitting: Family Medicine

## 2019-08-15 ENCOUNTER — Encounter: Payer: Self-pay | Admitting: Family Medicine

## 2019-08-15 ENCOUNTER — Ambulatory Visit (INDEPENDENT_AMBULATORY_CARE_PROVIDER_SITE_OTHER): Payer: Medicare PPO

## 2019-08-15 ENCOUNTER — Other Ambulatory Visit: Payer: Self-pay

## 2019-08-15 VITALS — BP 110/82 | HR 62 | Ht 62.0 in | Wt 178.0 lb

## 2019-08-15 DIAGNOSIS — M19011 Primary osteoarthritis, right shoulder: Secondary | ICD-10-CM | POA: Insufficient documentation

## 2019-08-15 DIAGNOSIS — G5702 Lesion of sciatic nerve, left lower limb: Secondary | ICD-10-CM | POA: Diagnosis not present

## 2019-08-15 DIAGNOSIS — G8929 Other chronic pain: Secondary | ICD-10-CM | POA: Diagnosis not present

## 2019-08-15 DIAGNOSIS — M25511 Pain in right shoulder: Secondary | ICD-10-CM

## 2019-08-15 HISTORY — DX: Lesion of sciatic nerve, left lower limb: G57.02

## 2019-08-15 HISTORY — DX: Primary osteoarthritis, right shoulder: M19.011

## 2019-08-15 MED ORDER — GABAPENTIN 100 MG PO CAPS: 200.0000 mg | ORAL_CAPSULE | Freq: Every day | ORAL | 0 refills | Status: DC

## 2019-08-15 NOTE — Progress Notes (Signed)
[facility] [place] Phone: [phone] Subjective:   [name], am serving as a scribe for Dr. Hulan Saas. This visit occurred during the SARS-CoV-2 public health emergency.  Safety protocols were in place, including screening questions prior to the visit, additional usage of staff PPE, and extensive cleaning of exam room while observing appropriate contact time as indicated for disinfecting solutions.   I'm seeing this patient by the request  of:  [name], MD  CC: Right shoulder pain, low back pain  [id number]  Kathy Long is a 69 y.o. female coming in with complaint of low back pain and right shoulder pain. Patient states that she has had 2 surgeries on her back, 2005 (bulging disc) and 2011 (debridement). Has pain in the left piriformis since March 2020. Worse at night. Denies any radiating symptoms.   Also having right shoulder pain for 2-3 years. Pain is intermittent but worse with extension. Pain over middle deltoid. Denies any radiating symptoms.  States that it seems to be worse at night.  He denies significant radiation of pain though.  Denies any weakness.  States that it can be very uncomfortable at night.        Past Medical History:  Diagnosis Date  . Acoustic neuroma (HCC)    left ear  . Arthritis    lower back, hands  . Cancer (HCC)    melanoma, shoulder  . Depression   . GERD (gastroesophageal reflux disease)   . Hyperlipidemia   . Hypertension   . 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    Past Surgical History:  Procedure Laterality Date  . APPENDECTOMY  2010  . BACK SURGERY     disectomy lumbar, scar tissue  . BREAST CYST EXCISION Right 1970   axilla  . CATARACT EXTRACTION W/PHACO Left 04/12/2018   Procedure: CATARACT EXTRACTION PHACO AND INTRAOCULAR LENS PLACEMENT ([place])  LEFT TORIC LENS;  Surgeon: Leandrew Koyanagi, MD;  Location: [facility];  Service: Ophthalmology;   Laterality: Left;  . CATARACT EXTRACTION W/PHACO Right 05/10/2018   Procedure: CATARACT EXTRACTION PHACO AND INTRAOCULAR LENS PLACEMENT ([place])  RIGHT TORIC LENS;  Surgeon: Leandrew Koyanagi, MD;  Location: [facility];  Service: Ophthalmology;  Laterality: Right;  DIABETIC  . CHOLECYSTECTOMY N/A 07/05/2017   Procedure: LAPAROSCOPIC CHOLECYSTECTOMY WITH INTRAOPERATIVE CHOLANGIOGRAM;  Surgeon: Robert Bellow, MD;  Location: [facility];  Service: General;  Laterality: N/A;  . COLONOSCOPY     2009 and color gaurd in 2018  . ERCP N/A 07/12/2017   Procedure: ENDOSCOPIC RETROGRADE CHOLANGIOPANCREATOGRAPHY (ERCP);  Surgeon: Lucilla Lame, MD;  Location[facility] END[facility]COPY;  Service: Endoscopy;  Laterality: N/A;  . ERCP N/A 10/04/2017   Procedure: ENDOSCOPIC RETROGRADE CHOLANGIOPANCREATOGRAPHY (ERCP);  Surgeon: Lucilla Lame, MD;  Location[facility] END[facility];  Service: Endoscopy;  Laterality: N/A;  . FOOT SURGERY     x 2  . MELANOMA EXCISION Left 2000  . PALATOPLASTY N/A 04/08/2015   Procedure: PALATOPLASTY;  Surgeon: Beverly Gust, MD;  Location: [facility];  Service: ENT;  Laterality: N/A;  . TONSILLECTOMY N/A 04/08/2015   Procedure: TONSILLECTOMY;  Surgeon: Beverly Gust, MD;  Location: [facility];  Service: ENT;  Laterality: N/A;  . TUBAL LIGATION     Social History   Socioeconomic History  . Marital status: Married    Spouse name: Not on file  . Number of children: Not on file  . Years of education: 96 , PhD  . Highest  education level: Not on file  Occupational History  . Occupation: Optometrist  Tobacco Use  . Smoking status: Never Smoker  . Smokeless tobacco: Never Used  . Tobacco comment: smoked socially in college  Substance and Sexual Activity  . Alcohol use: Yes    Alcohol/week: 0.0 standard drinks    Comment: occ - may have 1 glass wine/month  . Drug use: No  . Sexual activity: Not on file  Other Topics Concern  . Not on file  Social History Narrative  . Not on file    Social Determinants of Health   Financial Resource Strain:   . Difficulty of Paying Living Expenses:   Food Insecurity:   . Worried About Charity fundraiser in the Last Year:   . Arboriculturist in the Last Year:   Transportation Needs:   . Film/video editor (Medical):   Marland Kitchen Lack of Transportation (Non-Medical):   Physical Activity:   . Days of Exercise per Week:   . Minutes of Exercise per Session:   Stress:   . Feeling of Stress :   Social Connections:   . Frequency of Communication with Friends and Family:   . Frequency of Social Gatherings with Friends and Family:   . Attends Religious Services:   . Active Member of Clubs or Organizations:   . Attends Archivist Meetings:   Marland Kitchen Marital Status:    Allergies  Allergen Reactions  . Morphine And Related Other (See Comments)    Chest pain  . Tramadol Other (See Comments)    tremors  . Tape Rash        Family History  Problem Relation Age of Onset  . Cancer Sister 56       ovarian ca  . Breast cancer Neg Hx      Current Outpatient Medications (Cardiovascular):  .  amLODipine (NORVASC) 5 MG tablet, TAKE ONE TABLET BY MOUTH EVERY DAY .  hydrochlorothiazide (HYDRODIURIL) 25 MG tablet, Take 1 tablet (25 mg total) by mouth daily. .  metoprolol succinate (TOPROL-XL) 25 MG 24 hr tablet, TAKE 1 TABLET BY MOUTH WITH OR IMMEDIATLEY FOLLOWING A MEAL .  rosuvastatin (CRESTOR) 10 MG tablet, TAKE ONE TABLET BY MOUTH EVERY DAY   Current Outpatient Medications (Analgesics):  .  acetaminophen (TYLENOL) 325 MG tablet, Take 650 mg by mouth daily.   Current Outpatient Medications (Other):  .  b complex vitamins tablet, Take 1 tablet by mouth daily. .  Cholecalciferol 1000 UNITS tablet, Take 1,000 Units by mouth 2 (two) times daily. .  citalopram (CELEXA) 20 MG tablet, TAKE ONE TABLET BY MOUTH EVERY DAY .  clobetasol (TEMOVATE) 0.05 % external solution, APPLY DAILY UNTIL RASH IS CLEAR .  Melatonin 3 MG CAPS, Take 3 mg  by mouth at bedtime. Marland Kitchen  omeprazole (PRILOSEC) 40 MG capsule, TAKE 1 CAPSULE BY MOUTH ONCE DAILY .  ondansetron (ZOFRAN ODT) 4 MG disintegrating tablet, Take 1 tablet (4 mg total) by mouth every 8 (eight) hours as needed for nausea or vomiting. .  gabapentin (NEURONTIN) 100 MG capsule, Take 2 capsules (200 mg total) by mouth at bedtime.   Reviewed prior external information including notes and imaging from  primary care provider As well as notes that were available from care everywhere and other healthcare systems.  Past medical history, social, surgical and family history all reviewed in electronic medical record.  No pertanent information unless stated regarding to the chief complaint.   Review of Systems:  No headache, visual changes, nausea, vomiting, diarrhea, constipation, dizziness, abdominal pain, skin rash, fevers, chills, night sweats, weight loss, swollen lymph nodes, body aches, joint swelling, chest pain, shortness of breath, mood changes. POSITIVE muscle aches  Objective  Blood pressure 110/82, pulse 62, height 5\' 2"  (1.575 m), weight 178 lb (80.7 kg), SpO2 98 %.   General: No apparent distress alert and oriented x3 mood and affect normal, dressed appropriately.  HEENT: Pupils equal, extraocular movements intact  Respiratory: Patient's speak in full sentences and does not appear short of breath  Cardiovascular: No lower extremity edema, non tender, no erythema  Skin: Warm dry intact with no signs of infection or rash on extremities or on axial skeleton.  Abdomen: Soft nontender  Neuro: Cranial nerves II through XII are intact, neurovascularly intact in all extremities with 2+ DTRs and 2+ pulses.  Lymph: No lymphadenopathy of posterior or anterior cervical chain or axillae bilaterally.  Gait normal with good balance and coordination.  MSK:  tender with full range of motion and good stability and symmetric strength and tone of  elbows, wrist, hip, knee and ankles bilaterally.   Right shoulder exam does have near full range of motion.  Positive crossover.  No tenderness over the acromioclavicular joint.  4+ out of 5 strength of the rotator cuff compared to the contralateral side.  Minimal impingement noted.  Back exam does have some loss of lordosis, no tenderness to palpation over the sacroiliac joints bilaterally.  Tightness with Corky Sox test on the left side only.  Tenderness in the piriformis area as well as over the left greater trochanteric area.  Limited musculoskeletal ultrasound was performed and interpreted by Lyndal Pulley  Limited ultrasound of patient's right shoulder shows that patient does have a very small low-grade rotator cuff tear of the supraspinatus.  Patient does have moderate to severe arthritic changes with a capsulitis noted over the acromioclavicular joint.  Rest of patient's exam was fairly unremarkable.  Procedure: Real-time Ultrasound Guided Injection of right acromioclavicular joint Device: GE Logiq Q7 Ultrasound guided injection is preferred based studies that show increased duration, increased effect, greater accuracy, decreased procedural pain, increased response rate, and decreased cost with ultrasound guided versus blind injection.  Verbal informed consent obtained.  Time-out conducted.  Noted no overlying erythema, induration, or other signs of local infection.  Skin prepped in a sterile fashion.  Local anesthesia: Topical Ethyl chloride.  With sterile technique and under real time ultrasound guidance: With a 25-gauge half inch needle injected with 0.5 cc of 0.5% Marcaine and 0.5 cc of Kenalog 40 mg/mL Completed without difficulty  Pain immediately resolved suggesting accurate placement of the medication.  Advised to call if fevers/chills, erythema, induration, drainage, or persistent bleeding.  Images permanently stored and available for review in the ultrasound unit.  Impression: Technically successful ultrasound guided  injection.   Impression and Recommendations:     This case required medical decision making of moderate complexity. The above documentation has been reviewed and is accurate and complete Lyndal Pulley, DO       Note: This dictation was prepared with Dragon dictation along with smaller phrase technology. Any transcriptional errors that result from this process are unintentional.

## 2019-08-15 NOTE — Assessment & Plan Note (Signed)
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  Discussed icing regimen and home exercises.  Rest the shoulder exam seems to be fairly unremarkable except a very small low-grade partial tear at the musculotendinous juncture of the supraspinatus noted.  Discussed with patient go about home exercises, icing regimen, topical anti-inflammatories, follow-up again in 4 to 6 weeks.

## 2019-08-15 NOTE — Assessment & Plan Note (Signed)
Left piriformis syndrome.  Patient has had a history of fusion in the back previously.  Discussed the possibility of lumbar radiculopathy and started on gabapentin.  Warned of potential side effects.  Discussed home exercise and work with Product/process development scientist.  Increase activity slowly.  Follow-up with me in 8 weeks.  Worsening pain consider repeating x-rays as well as physical therapy.

## 2019-08-15 NOTE — Patient Instructions (Addendum)
Good to see you.  Ice 20 minutes 2 times daily. Usually after activity and before bed. Exercises 3 times a week.  pennsaid pinkie amount topically 2 times daily as needed.  Gabapentin 200 mg at night  Vitamin D 4000 IU daily  See me again in 5-6 weeks

## 2019-08-28 ENCOUNTER — Encounter: Payer: Self-pay | Admitting: Internal Medicine

## 2019-08-28 ENCOUNTER — Telehealth (INDEPENDENT_AMBULATORY_CARE_PROVIDER_SITE_OTHER): Payer: Medicare PPO | Admitting: Internal Medicine

## 2019-08-28 DIAGNOSIS — Z6830 Body mass index (BMI) 30.0-30.9, adult: Secondary | ICD-10-CM | POA: Diagnosis not present

## 2019-08-28 DIAGNOSIS — E6609 Other obesity due to excess calories: Secondary | ICD-10-CM | POA: Diagnosis not present

## 2019-08-28 DIAGNOSIS — G4733 Obstructive sleep apnea (adult) (pediatric): Secondary | ICD-10-CM | POA: Diagnosis not present

## 2019-08-28 NOTE — Progress Notes (Signed)
Virtual Visit via [place]  This visit type was conducted due to national recommendations for restrictions regarding the COVID-19 pandemic (e.g. social distancing).  This format is felt to be most appropriate for this patient at this time.  All issues noted in this document were discussed and addressed.  No physical exam was performed (except for noted visual exam findings with Video Visits).   I connected with@ on 08/28/19 at  3:30 PM EDT by a video enabled telemedicine application and verified that I am speaking with the correct person using two identifiers. Location patient: home Location provider: work or home office Persons participating in the virtual visit: patient, provider  I discussed the limitations, risks, security and privacy concerns of performing an evaluation and management service by telephone and the availability of in person appointments. I also discussed with the patient that there may be a patient responsible charge related to this service. The patient expressed understanding and agreed to proceed.   Reason for visit: follow up on recent sleep study   HPI:  69 yr old female who tested positive for OSA  Presents for discussion of results.  Patient was scheduled for CPAP titration study but was turned away because of a consistently positive COVID 19 test (despite several weeks of recovery from a symptomatic infection) .  Since then she has been trying to determine which of the available mouthguards would be covered by her insuranceas an alternative to the CPAP mask.  She denies hypertension,  Lower extremity edema and daytime hypersomnolence   ROS: See pertinent positives and negatives per HPI.  Past Medical History:  Diagnosis Date  . Acoustic neuroma (HCC)    left ear  . Arthritis    lower back, hands  . Cancer (HCC)    melanoma, shoulder  . Depression   . GERD (gastroesophageal reflux disease)   . Hyperlipidemia   . Hypertension   . Wears hearing aid in left  ear     Past Surgical History:  Procedure Laterality Date  . APPENDECTOMY  2010  . BACK SURGERY     disectomy lumbar, scar tissue  . BREAST CYST EXCISION Right 1970   axilla  . CATARACT EXTRACTION W/PHACO Left 04/12/2018   Procedure: CATARACT EXTRACTION PHACO AND INTRAOCULAR LENS PLACEMENT ([place])  LEFT TORIC LENS;  Surgeon: Leandrew Koyanagi, MD;  Location: [facility];  Service: Ophthalmology;  Laterality: Left;  . CATARACT EXTRACTION W/PHACO Right 05/10/2018   Procedure: CATARACT EXTRACTION PHACO AND INTRAOCULAR LENS PLACEMENT ([place])  RIGHT TORIC LENS;  Surgeon: Leandrew Koyanagi, MD;  Location: [facility];  Service: Ophthalmology;  Laterality: Right;  DIABETIC  . CHOLECYSTECTOMY N/A 07/05/2017   Procedure: LAPAROSCOPIC CHOLECYSTECTOMY WITH INTRAOPERATIVE CHOLANGIOGRAM;  Surgeon: Robert Bellow, MD;  Location: [facility];  Service: General;  Laterality: N/A;  . COLONOSCOPY     2009 and color gaurd in 2018  . ERCP N/A 07/12/2017   Procedure: ENDOSCOPIC RETROGRADE CHOLANGIOPANCREATOGRAPHY (ERCP);  Surgeon: Lucilla Lame, MD;  Location[facility] ENDOSCOPY;  Service: Endoscopy;  Laterality: N/A;  . ERCP N/A 10/04/2017   Procedure: ENDOSCOPIC RETROGRADE CHOLANGIOPANCREATOGRAPHY (ERCP);  Surgeon: Lucilla Lame, MD;  Location: [facility] ENDOSCOPY;  Service: Endoscopy;  Laterality: N/A;  . FOOT SURGERY     x 2  . MELANOMA EXCISION Left 2000  . PALATOPLASTY N/A 04/08/2015   Procedure: PALATOPLASTY;  Surgeon: Beverly Gust, MD;  Location: [facility];  Service: ENT;  Laterality: N/A;  . TONSILLECTOMY N/A 04/08/2015   Procedure: TONSILLECTOMY;  Surgeon: Beverly Gust, MD;  Location: [facility];  Service: ENT;  Laterality: N/A;  . TUBAL LIGATION      Family History  Problem Relation Age of Onset  . Cancer Sister 7       ovarian ca  . Breast cancer Neg Hx     SOCIAL HX:  reports that she has never smoked. She has never used smokeless tobacco. She reports current alcohol use. She  reports that she does not use drugs.   Current Outpatient Medications:  .  acetaminophen (TYLENOL) 325 MG tablet, Take 650 mg by mouth daily., Disp: , Rfl:  .  amLODipine (NORVASC) 5 MG tablet, TAKE ONE TABLET BY MOUTH EVERY DAY, Disp: 90 tablet, Rfl: 1 .  b complex vitamins tablet, Take 1 tablet by mouth daily., Disp: , Rfl:  .  Cholecalciferol 1000 UNITS tablet, Take 1,000 Units by mouth daily. Pt is taking 4,000 IUS daily, Disp: , Rfl:  .  citalopram (CELEXA) 20 MG tablet, TAKE ONE TABLET BY MOUTH EVERY DAY, Disp: 90 tablet, Rfl: 1 .  clobetasol (TEMOVATE) 0.05 % external solution, APPLY DAILY UNTIL RASH IS CLEAR, Disp: 50 mL, Rfl: 3 .  gabapentin (NEURONTIN) 100 MG capsule, Take 2 capsules (200 mg total) by mouth at bedtime., Disp: 180 capsule, Rfl: 0 .  hydrochlorothiazide (HYDRODIURIL) 25 MG tablet, Take 1 tablet (25 mg total) by mouth daily., Disp: 90 tablet, Rfl: 3 .  Melatonin 3 MG CAPS, Take 3 mg by mouth at bedtime., Disp: , Rfl:  .  metoprolol succinate (TOPROL-XL) 25 MG 24 hr tablet, TAKE 1 TABLET BY MOUTH WITH OR IMMEDIATLEY FOLLOWING A MEAL, Disp: 90 tablet, Rfl: 1 .  omeprazole (PRILOSEC) 40 MG capsule, TAKE 1 CAPSULE BY MOUTH ONCE DAILY, Disp: 90 capsule, Rfl: 1 .  ondansetron (ZOFRAN ODT) 4 MG disintegrating tablet, Take 1 tablet (4 mg total) by mouth every 8 (eight) hours as needed for nausea or vomiting., Disp: 20 tablet, Rfl: 0 .  rosuvastatin (CRESTOR) 10 MG tablet, TAKE ONE TABLET BY MOUTH EVERY DAY, Disp: 90 tablet, Rfl: 1  EXAM:  VITALS per patient if applicable:  GENERAL: alert, oriented, appears well and in no acute distress  HEENT: atraumatic, conjunttiva clear, no obvious abnormalities on inspection of external nose and ears  NECK: normal movements of the head and neck  LUNGS: on inspection no signs of respiratory distress, breathing rate appears normal, no obvious gross SOB, gasping or wheezing  CV: no obvious cyanosis  MS: moves all visible extremities  without noticeable abnormality  PSYCH/NEURO: pleasant and cooperative, no obvious depression or anxiety, speech and thought processing grossly intact  ASSESSMENT AND PLAN:  Discussed the following assessment and plan:  OSA (obstructive sleep apnea)  Class 1 obesity due to excess calories without serious comorbidity with body mass index (BMI) of 30.0 to 30.9 in adult  OSA (obstructive sleep apnea) She prefers to defer use of CPAP in favor of trying the mouthguard and losing weight.  Will reassess in 6 months   Obesity I have  Reviewed her BMI and encouraged  Continued weight loss with goal of 10% of body weight over the next 6 months using a low glycemic index diet and regular exercise a minimum of 5 days per week.    I provided  30 minutes of  face-to-face time during this encounter reviewing patient's current problems and past surgeries, labs and imaging studies, providing counseling on the above mentioned problems , and coordination  of care .   I discussed the assessment  and treatment plan with the patient. The patient was provided an opportunity to ask questions and all were answered. The patient agreed with the plan and demonstrated an understanding of the instructions.   The patient was advised to call back or seek an in-person evaluation if the symptoms worsen or if the condition fails to improve as anticipated.    [name], MD

## 2019-08-29 NOTE — Assessment & Plan Note (Signed)
She prefers to defer use of CPAP in favor of trying the mouthguard and losing weight.  Will reassess in 6 months

## 2019-08-29 NOTE — Assessment & Plan Note (Signed)
I have  Reviewed her BMI and encouraged  Continued weight loss with goal of 10% of body weight over the next 6 months using a low glycemic index diet and regular exercise a minimum of 5 days per week.

## 2019-09-05 DIAGNOSIS — L570 Actinic keratosis: Secondary | ICD-10-CM | POA: Diagnosis not present

## 2019-09-05 DIAGNOSIS — D0361 Melanoma in situ of right upper limb, including shoulder: Secondary | ICD-10-CM | POA: Diagnosis not present

## 2019-09-05 DIAGNOSIS — D485 Neoplasm of uncertain behavior of skin: Secondary | ICD-10-CM | POA: Diagnosis not present

## 2019-09-07 ENCOUNTER — Other Ambulatory Visit: Payer: Self-pay | Admitting: Internal Medicine

## 2019-09-19 ENCOUNTER — Ambulatory Visit: Payer: Medicare PPO | Admitting: Family Medicine

## 2019-09-26 IMAGING — MG MM DIGITAL SCREENING BILAT W/ TOMO W/ CAD
8 of 13 series · 8 of 29 positions shown · non-contrast
Comparison: Previous exam(s).

CLINICAL DATA: Screening.

EXAM:
DIGITAL SCREENING BILATERAL MAMMOGRAM WITH TOMO AND CAD

[L MLO (1 of 2)]
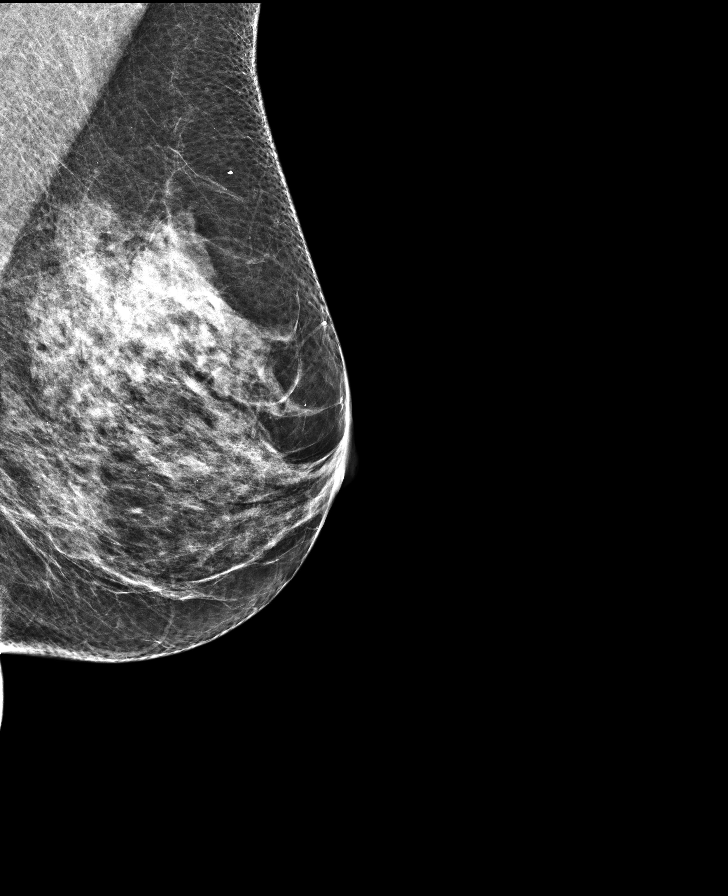

[R CC]
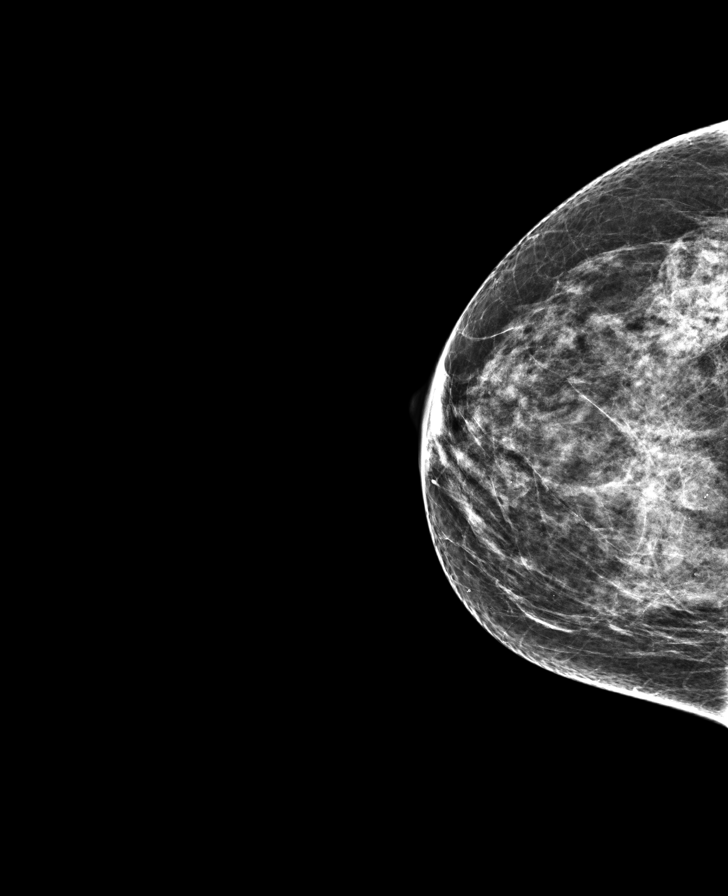

[L MLO (2 of 2)]
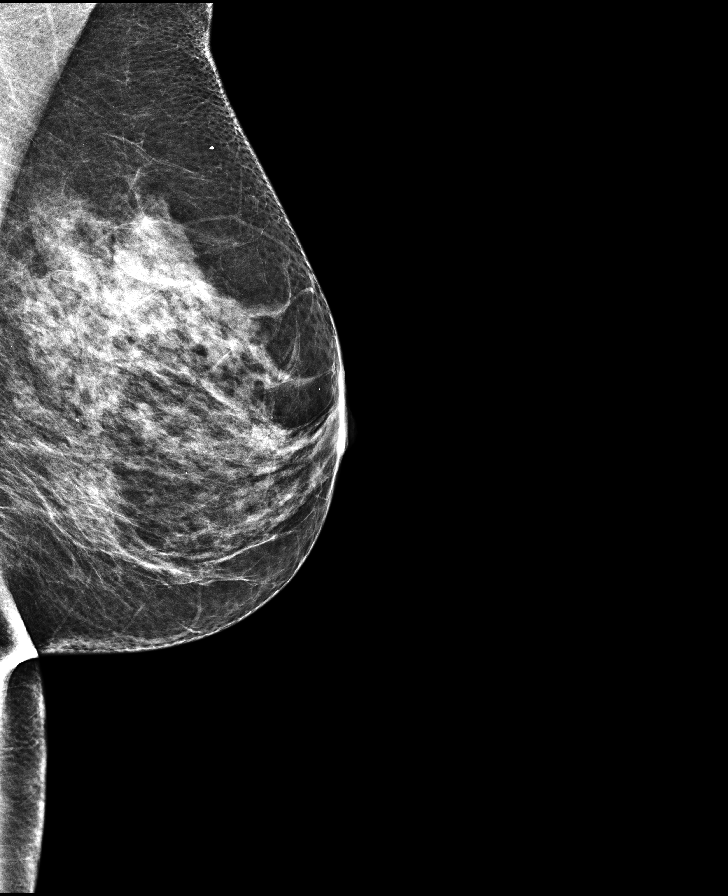

[L CC synth-2D]
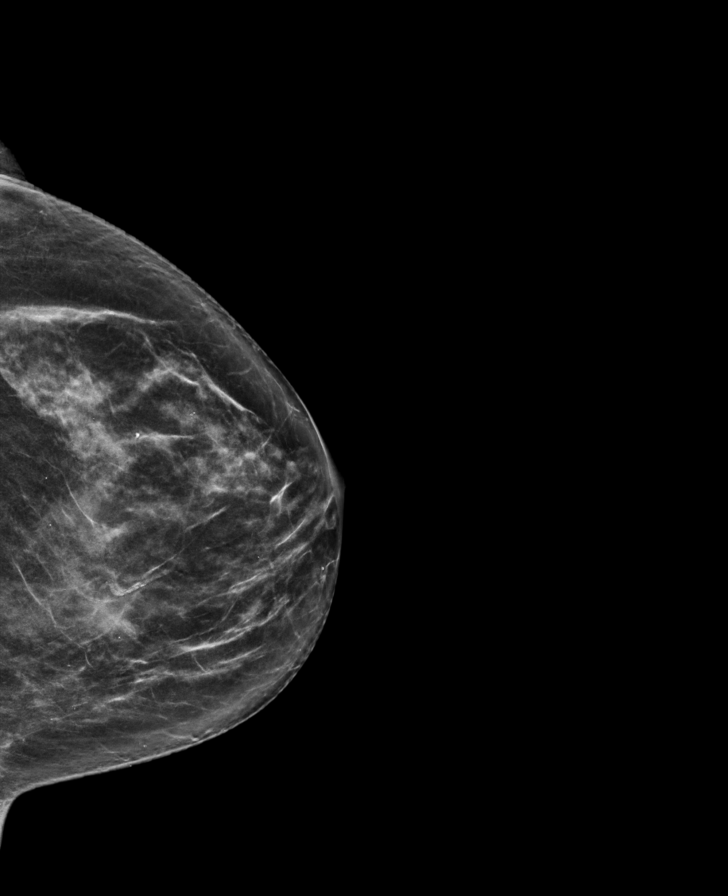

[R MLO]
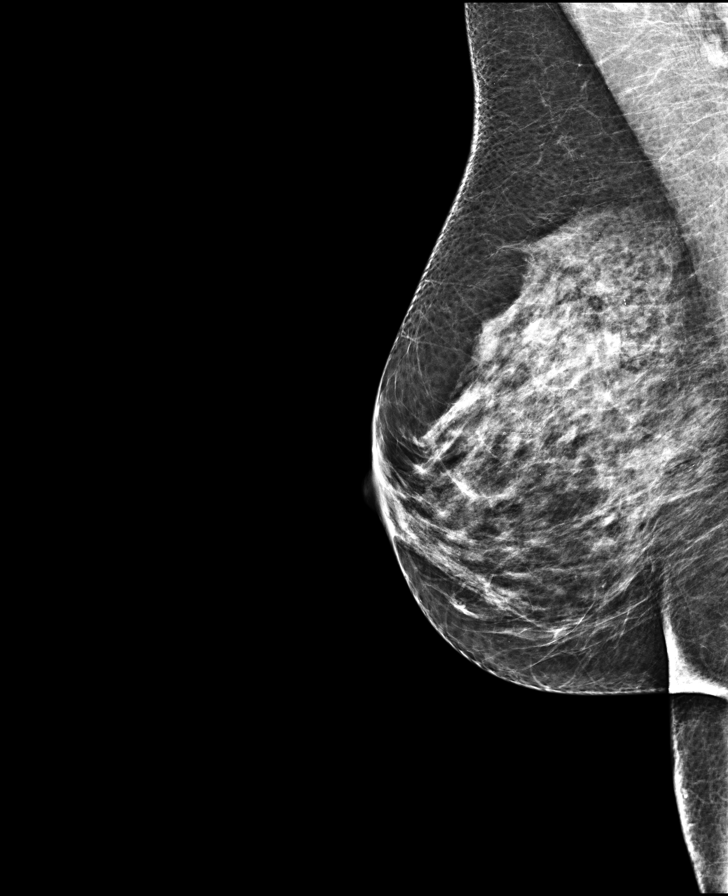

[R CC synth-2D]
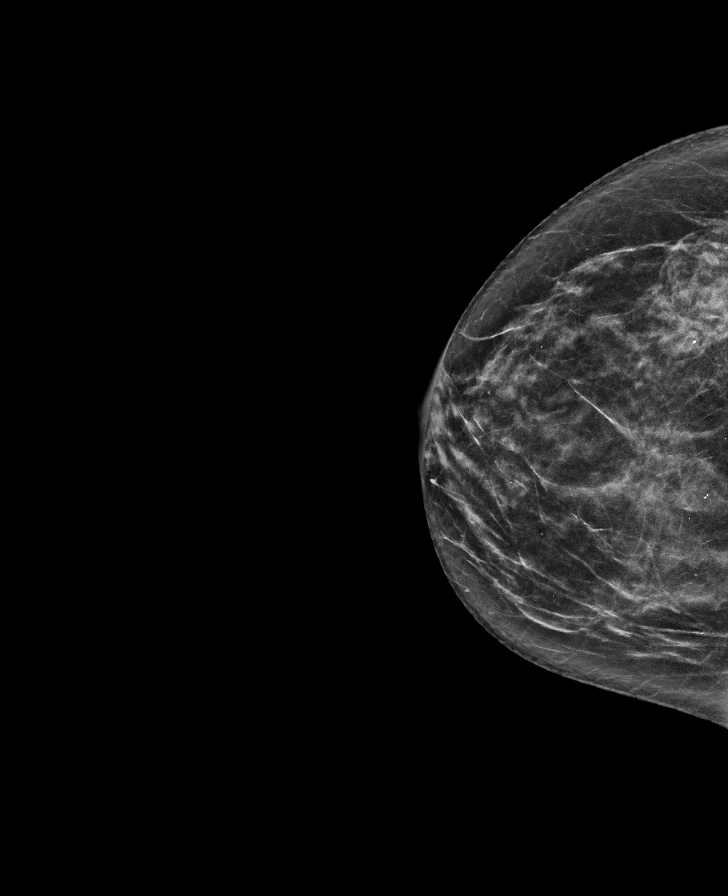

[L CC]
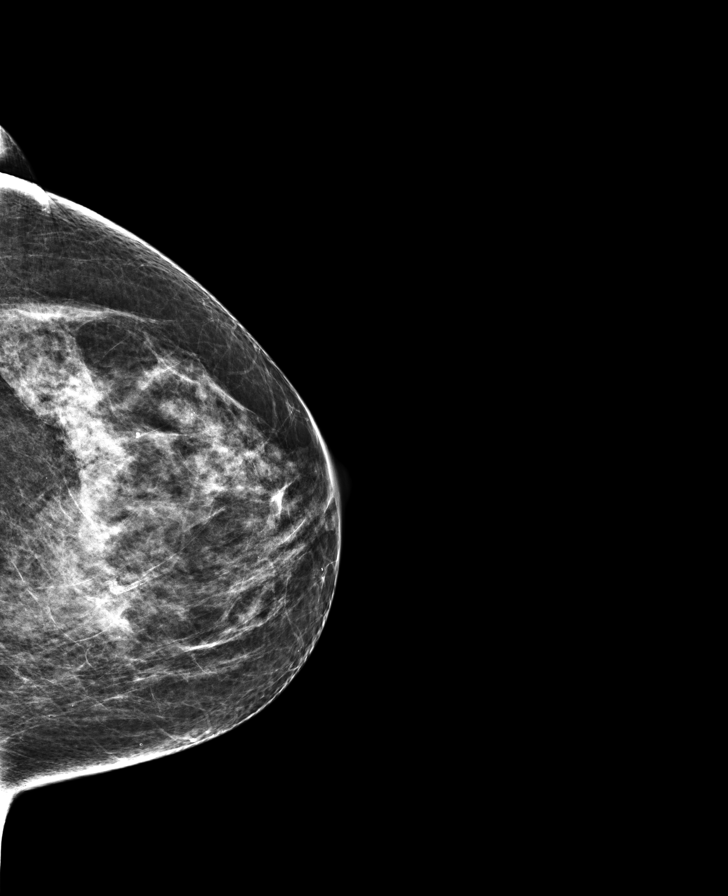

[L MLO synth-2D]
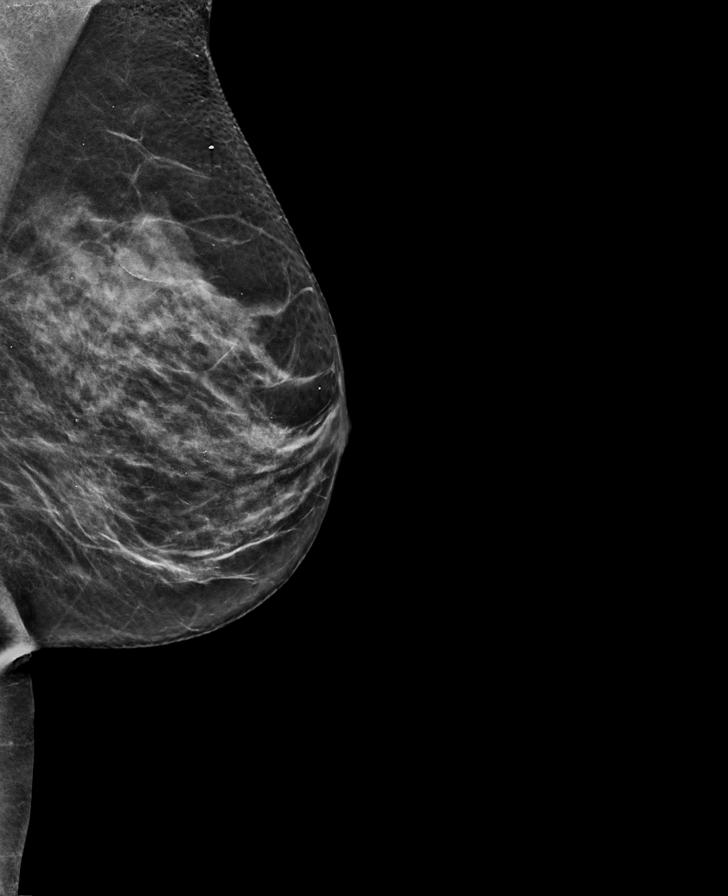

[8 of 29 positions shown; findings below may reference images not displayed]

ACR Breast Density Category c: The breast tissue is heterogeneously
dense, which may obscure small masses.
FINDINGS: There are no findings suspicious for malignancy. Images were
processed with CAD.
IMPRESSION: No mammographic evidence of malignancy. A result letter of this
screening mammogram will be mailed directly to the patient.

RECOMMENDATION:
Screening mammogram in one year. (Code:[id number])

BI-RADS CATEGORY  1: Negative.

## 2019-10-16 ENCOUNTER — Telehealth: Payer: Self-pay | Admitting: Internal Medicine

## 2019-10-16 NOTE — Telephone Encounter (Signed)
Left message for patient to call back and schedule Medicare Annual Wellness Visit (AWV) either virtually or audio only.  Last AWV 03/05/15; please schedule at anytime with Denisa O'Brien-Blaney at [facility].

## 2019-10-17 ENCOUNTER — Ambulatory Visit: Payer: Medicare PPO | Admitting: Family Medicine

## 2019-10-17 ENCOUNTER — Ambulatory Visit (INDEPENDENT_AMBULATORY_CARE_PROVIDER_SITE_OTHER)
Admission: RE | Admit: 2019-10-17 | Discharge: 2019-10-17 | Disposition: A | Discharge status: Home / Self Care | Payer: Medicare PPO | Source: Ambulatory Visit | Attending: Family Medicine | Admitting: Family Medicine

## 2019-10-17 ENCOUNTER — Other Ambulatory Visit: Payer: Self-pay

## 2019-10-17 ENCOUNTER — Encounter: Payer: Self-pay | Admitting: Family Medicine

## 2019-10-17 ENCOUNTER — Ambulatory Visit: Payer: Self-pay

## 2019-10-17 VITALS — BP 156/92 | HR 63 | Ht 62.0 in | Wt 173.0 lb

## 2019-10-17 DIAGNOSIS — M19011 Primary osteoarthritis, right shoulder: Secondary | ICD-10-CM | POA: Diagnosis not present

## 2019-10-17 DIAGNOSIS — G8929 Other chronic pain: Secondary | ICD-10-CM | POA: Diagnosis not present

## 2019-10-17 DIAGNOSIS — M25511 Pain in right shoulder: Secondary | ICD-10-CM

## 2019-10-17 DIAGNOSIS — M542 Cervicalgia: Secondary | ICD-10-CM | POA: Diagnosis not present

## 2019-10-17 HISTORY — DX: Pain in right shoulder: M25.511

## 2019-10-17 NOTE — Assessment & Plan Note (Signed)
Right shoulder pain.  Discussed icing regimen and home exercise, which activities to do which wants to avoid.  Patient is to increase activity slowly over the course the next several weeks.  Due to history of multiple melanomas on arms bilaterally x-rays ordered today and if no significant improvement I do feel that an MR arthrogram would be necessary sooner than later.  Likely no fevers or chills or any abnormal weight loss.  Discussed medications and started gabapentin low dose.  Follow-up again 4 to 8 weeks

## 2019-10-17 NOTE — Patient Instructions (Addendum)
Xray of neck and shoulder Calcium Pyruvate 1500 mg If 2-3 weeks if you are not better we will order MRI

## 2019-10-17 NOTE — Progress Notes (Signed)
[name] Phone: [phone] Subjective:    I'm seeing this patient by the request  of:  [name], MD  CC: Shoulder pain follow-up  [id number]   08/15/2019 Left piriformis syndrome.  Patient has had a history of fusion in the back previously.  Discussed the possibility of lumbar radiculopathy and started on gabapentin.  Warned of potential side effects.  Discussed home exercise and work with Product/process development scientist.  Increase activity slowly.  Follow-up with me in 8 weeks.  Worsening pain consider repeating x-rays as well as physical therapy.  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  Discussed icing regimen and home exercises.  Rest the shoulder exam seems to be fairly unremarkable except a very small low-grade partial tear at the musculotendinous juncture of the supraspinatus noted.  Discussed with patient go about home exercises, icing regimen, topical anti-inflammatories, follow-up again in 4 to 6 weeks.  Update 10/17/2019 Kathy Long is a 69 y.o. female coming in with complaint of left piriformis pain and right AC joint pain. Patient states her hip is doing better. Right shoulder is not much better. Posterior pain. Past few days she has had right sided neck pain. Taking gabapentin and not sure if it is helping. Had melanoma on her arm that was removed and hasn't been exercising.       Past Medical History:  Diagnosis Date  . Acoustic neuroma (HCC)    left ear  . Arthritis    lower back, hands  . Cancer (HCC)    melanoma, shoulder  . Depression   . GERD (gastroesophageal reflux disease)   . Hyperlipidemia   . Hypertension   . 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    Past Surgical History:  Procedure Laterality Date  . APPENDECTOMY  2010  . BACK SURGERY     disectomy lumbar, scar tissue  . BREAST CYST EXCISION Right 1970   axilla  . CATARACT EXTRACTION W/PHACO Left 04/12/2018   Procedure: CATARACT EXTRACTION PHACO AND INTRAOCULAR LENS PLACEMENT ([place])  LEFT TORIC LENS;  Surgeon: Leandrew Koyanagi, MD;  Location: [facility];  Service: Ophthalmology;  Laterality: Left;  . CATARACT EXTRACTION W/PHACO Right 05/10/2018   Procedure: CATARACT EXTRACTION PHACO AND INTRAOCULAR LENS PLACEMENT ([place])  RIGHT TORIC LENS;  Surgeon: Leandrew Koyanagi, MD;  Location: [facility];  Service: Ophthalmology;  Laterality: Right;  DIABETIC  . CHOLECYSTECTOMY N/A 07/05/2017   Procedure: LAPAROSCOPIC CHOLECYSTECTOMY WITH INTRAOPERATIVE CHOLANGIOGRAM;  Surgeon: Robert Bellow, MD;  Location: [facility];  Service: General;  Laterality: N/A;  . COLONOSCOPY     2009 and color gaurd in 2018  . ERCP N/A 07/12/2017   Procedure: ENDOSCOPIC RETROGRADE CHOLANGIOPANCREATOGRAPHY (ERCP);  Surgeon: Lucilla Lame, MD;  Location: [facility] ENDOSCOPY;  Service: Endoscopy;  Laterality: N/A;  . ERCP N/A 10/04/2017   Procedure: ENDOSCOPIC RETROGRADE CHOLANGIOPANCREATOGRAPHY (ERCP);  Surgeon: Lucilla Lame, MD;  Location: [facility] ENDOSCOPY;  Service: Endoscopy;  Laterality: N/A;  . FOOT SURGERY     x 2  . MELANOMA EXCISION Left 2000  . PALATOPLASTY N/A 04/08/2015   Procedure: PALATOPLASTY;  Surgeon: Beverly Gust, MD;  Location: [facility];  Service: ENT;  Laterality: N/A;  . TONSILLECTOMY N/A 04/08/2015   Procedure: TONSILLECTOMY;  Surgeon: Beverly Gust, MD;  Location: [facility];  Service: ENT;  Laterality: N/A;  . TUBAL LIGATION     Social History   Socioeconomic History  . Marital status: Married    Spouse  name: Not on file  . Number of children: Not on file  . Years of education: 57 , PhD  . Highest education level: Not on file  Occupational History  . Occupation: Optometrist  Tobacco Use  . Smoking status: Never Smoker  . Smokeless tobacco: Never Used  . Tobacco comment: smoked socially in college  Substance and Sexual Activity  . Alcohol use: Yes    Alcohol/week: 0.0 standard drinks     Comment: occ - may have 1 glass wine/month  . Drug use: No  . Sexual activity: Not on file  Other Topics Concern  . Not on file  Social History Narrative  . Not on file   Social Determinants of Health   Financial Resource Strain:   . Difficulty of Paying Living Expenses:   Food Insecurity:   . Worried About Charity fundraiser in the Last Year:   . Arboriculturist in the Last Year:   Transportation Needs:   . Film/video editor (Medical):   Marland Kitchen Lack of Transportation (Non-Medical):   Physical Activity:   . Days of Exercise per Week:   . Minutes of Exercise per Session:   Stress:   . Feeling of Stress :   Social Connections:   . Frequency of Communication with Friends and Family:   . Frequency of Social Gatherings with Friends and Family:   . Attends Religious Services:   . Active Member of Clubs or Organizations:   . Attends Archivist Meetings:   Marland Kitchen Marital Status:    Allergies  Allergen Reactions  . Morphine And Related Other (See Comments)    Chest pain  . Tramadol Other (See Comments)    tremors  . Tape Rash        Family History  Problem Relation Age of Onset  . Cancer Sister 53       ovarian ca  . Breast cancer Neg Hx      Current Outpatient Medications (Cardiovascular):  .  amLODipine (NORVASC) 5 MG tablet, TAKE ONE TABLET BY MOUTH EVERY DAY .  hydrochlorothiazide (HYDRODIURIL) 25 MG tablet, Take 1 tablet (25 mg total) by mouth daily. .  metoprolol succinate (TOPROL-XL) 25 MG 24 hr tablet, TAKE 1 TABLET BY MOUTH WITH OR IMMEDIATLEY FOLLOWING A MEAL .  rosuvastatin (CRESTOR) 10 MG tablet, TAKE ONE TABLET BY MOUTH EVERY DAY   Current Outpatient Medications (Analgesics):  .  acetaminophen (TYLENOL) 325 MG tablet, Take 650 mg by mouth daily.   Current Outpatient Medications (Other):  .  b complex vitamins tablet, Take 1 tablet by mouth daily. .  Cholecalciferol 1000 UNITS tablet, Take 1,000 Units by mouth daily. Pt is taking 4,000 IUS  daily .  citalopram (CELEXA) 20 MG tablet, TAKE ONE TABLET BY MOUTH EVERY DAY .  clobetasol (TEMOVATE) 0.05 % external solution, APPLY DAILY UNTIL RASH IS CLEAR .  gabapentin (NEURONTIN) 100 MG capsule, Take 2 capsules (200 mg total) by mouth at bedtime. .  Melatonin 3 MG CAPS, Take 3 mg by mouth at bedtime. Marland Kitchen  omeprazole (PRILOSEC) 40 MG capsule, TAKE 1 CAPSULE BY MOUTH ONCE DAILY .  ondansetron (ZOFRAN ODT) 4 MG disintegrating tablet, Take 1 tablet (4 mg total) by mouth every 8 (eight) hours as needed for nausea or vomiting.   Reviewed prior external information including notes and imaging from  primary care provider As well as notes that were available from care everywhere and other healthcare systems.  Past medical history, social, surgical  and family history all reviewed in electronic medical record.  No pertanent information unless stated regarding to the chief complaint.   Review of Systems:  No headache, visual changes, nausea, vomiting, diarrhea, constipation, dizziness, abdominal pain, skin rash, fevers, chills, night sweats, weight loss, swollen lymph nodes, body aches, joint swelling, chest pain, shortness of breath, mood changes. POSITIVE muscle aches  Objective  Blood pressure (!) 156/92, pulse 63, height 5\' 2"  (1.575 m), weight 173 lb (78.5 kg), SpO2 92 %.   General: No apparent distress alert and oriented x3 mood and affect normal, dressed appropriately.  HEENT: Pupils equal, extraocular movements intact  Respiratory: Patient's speak in full sentences and does not appear short of breath  Cardiovascular: No lower extremity edema, non tender, no erythema  Neuro: Cranial nerves II through XII are intact, neurovascularly intact in all extremities with 2+ DTRs and 2+ pulses.  Gait normal with good balance and coordination.  MSK: Right shoulder exam shows the patient is well-healed in the mid arm from a large excision.  Patient continues to have impingement-like signs.  Rotator  cuff strength appears to be intact.  Positive crossover test noted though.  Near full range of motion.  Procedure: Real-time Ultrasound Guided Injection of right glenohumeral joint Device: GE Logiq Q7  Ultrasound guided injection is preferred based studies that show increased duration, increased effect, greater accuracy, decreased procedural pain, increased response rate with ultrasound guided versus blind injection.  Verbal informed consent obtained.  Time-out conducted.  Noted no overlying erythema, induration, or other signs of local infection.  Skin prepped in a sterile fashion.  Local anesthesia: Topical Ethyl chloride.  With sterile technique and under real time ultrasound guidance:  Joint visualized.  23g 1  inch needle inserted posterior approach. Pictures taken for needle placement. Patient did have injection of 2 cc of 1% lidocaine, 2 cc of 0.5% Marcaine, and 1.0 cc of Kenalog 40 mg/dL. Completed without difficulty  Pain immediately resolved suggesting accurate placement of the medication.  Advised to call if fevers/chills, erythema, induration, drainage, or persistent bleeding.  Images permanently stored and available for review in the ultrasound unit.  Impression: Technically successful ultrasound guided injection.    Impression and Recommendations:     This case required medical decision making of moderate complexity. The above documentation has been reviewed and is accurate and complete Lyndal Pulley, DO       Note: This dictation was prepared with Dragon dictation along with smaller phrase technology. Any transcriptional errors that result from this process are unintentional.

## 2019-10-26 ENCOUNTER — Telehealth: Payer: Self-pay | Admitting: Internal Medicine

## 2019-10-26 NOTE — Telephone Encounter (Signed)
Left message to return call to office in regard to cologuard. Patient is over due for cologuard.

## 2019-12-10 ENCOUNTER — Ambulatory Visit (INDEPENDENT_AMBULATORY_CARE_PROVIDER_SITE_OTHER): Payer: Medicare PPO | Admitting: Internal Medicine

## 2019-12-10 ENCOUNTER — Other Ambulatory Visit: Payer: Self-pay | Admitting: Internal Medicine

## 2019-12-10 ENCOUNTER — Encounter: Payer: Self-pay | Admitting: Internal Medicine

## 2019-12-10 ENCOUNTER — Other Ambulatory Visit: Payer: Self-pay

## 2019-12-10 ENCOUNTER — Encounter: Payer: Self-pay | Admitting: Family Medicine

## 2019-12-10 VITALS — BP 122/78 | HR 55 | Temp 97.4°F | Resp 16 | Ht 62.0 in | Wt 175.4 lb

## 2019-12-10 DIAGNOSIS — E785 Hyperlipidemia, unspecified: Secondary | ICD-10-CM

## 2019-12-10 DIAGNOSIS — G4733 Obstructive sleep apnea (adult) (pediatric): Secondary | ICD-10-CM | POA: Diagnosis not present

## 2019-12-10 DIAGNOSIS — R7303 Prediabetes: Secondary | ICD-10-CM

## 2019-12-10 DIAGNOSIS — H90A22 Sensorineural hearing loss, unilateral, left ear, with restricted hearing on the contralateral side: Secondary | ICD-10-CM | POA: Diagnosis not present

## 2019-12-10 DIAGNOSIS — Z6830 Body mass index (BMI) 30.0-30.9, adult: Secondary | ICD-10-CM | POA: Diagnosis not present

## 2019-12-10 DIAGNOSIS — Z780 Asymptomatic menopausal state: Secondary | ICD-10-CM | POA: Diagnosis not present

## 2019-12-10 DIAGNOSIS — R1310 Dysphagia, unspecified: Secondary | ICD-10-CM

## 2019-12-10 DIAGNOSIS — E1169 Type 2 diabetes mellitus with other specified complication: Secondary | ICD-10-CM

## 2019-12-10 DIAGNOSIS — I10 Essential (primary) hypertension: Secondary | ICD-10-CM | POA: Diagnosis not present

## 2019-12-10 DIAGNOSIS — H903 Sensorineural hearing loss, bilateral: Secondary | ICD-10-CM | POA: Diagnosis not present

## 2019-12-10 DIAGNOSIS — E6609 Other obesity due to excess calories: Secondary | ICD-10-CM

## 2019-12-10 DIAGNOSIS — Z1231 Encounter for screening mammogram for malignant neoplasm of breast: Secondary | ICD-10-CM | POA: Diagnosis not present

## 2019-12-10 DIAGNOSIS — N1831 Chronic kidney disease, stage 3a: Secondary | ICD-10-CM

## 2019-12-10 DIAGNOSIS — D333 Benign neoplasm of cranial nerves: Secondary | ICD-10-CM

## 2019-12-10 DIAGNOSIS — Z0000 Encounter for general adult medical examination without abnormal findings: Secondary | ICD-10-CM

## 2019-12-10 DIAGNOSIS — Z1211 Encounter for screening for malignant neoplasm of colon: Secondary | ICD-10-CM | POA: Diagnosis not present

## 2019-12-10 DIAGNOSIS — R1319 Other dysphagia: Secondary | ICD-10-CM

## 2019-12-10 HISTORY — DX: Hyperlipidemia, unspecified: E78.5

## 2019-12-10 HISTORY — DX: Type 2 diabetes mellitus with other specified complication: E11.69

## 2019-12-10 LAB — TSH: TSH: 2.19 u[IU]/mL (ref 0.35–4.50)

## 2019-12-10 LAB — COMPREHENSIVE METABOLIC PANEL
ALT: 23 U/L (ref 0–35)
AST: 18 U/L (ref 0–37)
Albumin: 4.2 g/dL (ref 3.5–5.2)
Alkaline Phosphatase: 57 U/L (ref 39–117)
BUN: 22 mg/dL (ref 6–23)
CO2: 31 mEq/L (ref 19–32)
Calcium: 9.4 mg/dL (ref 8.4–10.5)
Chloride: 105 mEq/L (ref 96–112)
Creatinine, Ser: 1.13 mg/dL (ref 0.40–1.20)
GFR: 47.4 mL/min — ABNORMAL LOW (ref >60.00)
Glucose, Bld: 90 mg/dL (ref 70–99)
Potassium: 4.2 mEq/L (ref 3.5–5.1)
Sodium: 142 mEq/L (ref 135–145)
Total Bilirubin: 0.9 mg/dL (ref 0.2–1.2)
Total Protein: 6.1 g/dL (ref 6.0–8.3)

## 2019-12-10 LAB — LIPID PANEL
Cholesterol: 157 mg/dL (ref 0–200)
HDL: 51 mg/dL (ref >39.00)
LDL Cholesterol: 77 mg/dL (ref 0–99)
NonHDL: 106.31
Total CHOL/HDL Ratio: 3
Triglycerides: 149 mg/dL (ref 0.0–149.0)
VLDL: 29.8 mg/dL (ref 0.0–40.0)

## 2019-12-10 MED ORDER — GABAPENTIN 100 MG PO CAPS: 200.0000 mg | ORAL_CAPSULE | Freq: Every day | ORAL | 0 refills | Status: DC

## 2019-12-10 NOTE — Progress Notes (Signed)
Patient ID: Kathy Long, female    DOB: 09/07/1950  Age: 69 y.o. MRN: [id number]  The patient is here for annual wellness examination and management of other chronic and acute problems.  This visit occurred during the SARS-CoV-2 public health emergency.  Safety protocols were in place, including screening questions prior to the visit, additional usage of staff PPE, and extensive cleaning of exam room while observing appropriate contact time as indicated for disinfecting solutions.    Patient has received both doses of the available COVID 19 vaccine without complications.  Patient continues to mask when outside of the home except when walking in yard or at safe distances from others .  Patient denies any change in mood or development of unhealthy behaviors resuting from the pandemic's restriction of activities and socialization.     The risk factors are reflected in the social history.  The roster of all physicians providing medical care to patient - is listed in the Snapshot section of the chart.  Activities of daily living:  The patient is 100% independent in all ADLs: dressing, toileting, feeding as well as independent mobility  Home safety : The patient has smoke detectors in the home. They wear seatbelts.  There are no firearms at home. There is no violence in the home.   There is no risks for hepatitis, STDs or HIV. There is no   history of blood transfusion. They have no travel history to infectious disease endemic areas of the world.  The patient has seen their dentist in the last six month. They have seen their eye doctor in the last year. She has had progressive hearing loss  By repeat audiologic testing in the last year.  They do not  have excessive sun exposure. Discussed the need for sun protection: hats, long sleeves and use of sunscreen if there is significant sun exposure.   Diet: the importance of a healthy diet is discussed. They do have a healthy diet.  The benefits of  regular aerobic exercise were discussed. She walks 2 or 3 times per week ,  20 minutes.   Depression screen: there are mild signs or vegative symptoms of depression- limited to morning  Sadness, but no other symptoms and is able to improve mood by mid morning   Cognitive assessment: the patient manages all their financial and personal affairs and is actively engaged. They could relate day,date,year and events; recalled 2/3 objects at 3 minutes; performed clock-face test normally.  The following portions of the patient's history were reviewed and updated as appropriate: allergies, current medications, past family history, past medical history,  past surgical history, past social history  and problem list.  Visual acuity was not assessed per patient preference since she has regular follow up with her ophthalmologist. Hearing and body mass index were assessed and reviewed.   During the course of the visit the patient was educated and counseled about appropriate screening and preventive services including : fall prevention , diabetes screening, nutrition counseling, colorectal cancer screening, and recommended immunizations.    CC: The primary encounter diagnosis was Colon cancer screening. Diagnoses of Class 1 obesity due to excess calories with serious comorbidity and body mass index (BMI) of 30.0 to 30.9 in adult, OSA (obstructive sleep apnea), Hyperlipidemia LDL goal <130, Essential hypertension, Prediabetes, Esophageal dysphagia, Breast cancer screening by mammogram, Postmenopausal estrogen deficiency, Encounter for preventive health examination, Acoustic neuroma ([place]), and Stage 3a chronic kidney disease were also pertinent to this visit.   1) UNTREATED OSA:  has been deferring titration study in favor of trying a mouthguard,  But the cost is prohibitive so she has agreed to a titration study   2) MRI OF IACS ORDERED by ENT for [place] [facility]  3) OBESITY:   Trouble losing weight and keeping it off.  Tried a commercial diet that included drinking 128 ounces daily of an electrolyte sodium  Infused commercial water product and a  lean protein diet .  Saw some results, ls about ten lbs,  But she developed  epigastric pain, which has resolved since she stopped drinking the water. She aas gained the weight since then   4) feels stuffy in the morning .  Not snoring per husband ,  But has a lot of bad dreams.   Has had  Tonsillectomy/Uvulectomy in 2017.  Has a cough/choking spell on average once per meal, happens with  liquids and solids,  At least once per meal  History Kathy Long has a past medical history of Acoustic neuroma ([place]), Arthritis, Cancer ([place]), Depression, GERD (gastroesophageal reflux disease), Hyperlipidemia, Hyperlipidemia associated with type 2 diabetes mellitus ([place]) (12/10/2019), Hypertension, and 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.   She has a past surgical history that includes Melanoma excision (Left, 2000); Foot surgery; Back surgery; Tonsillectomy (N/A, 04/08/2015); Palatoplasty (N/A, 04/08/2015); Appendectomy (2010); Colonoscopy; Tubal ligation; Cholecystectomy (N/A, 07/05/2017); ERCP (N/A, 07/12/2017); Breast cyst excision (Right, 1970); ERCP (N/A, 10/04/2017); Cataract extraction w/PHACO (Left, 04/12/2018); and Cataract extraction w/PHACO (Right, 05/10/2018).   Her family history includes Cancer (age of onset: 52) in her sister.She reports that she has never smoked. She has never used smokeless tobacco. She reports current alcohol use. She reports that she does not use drugs.  Outpatient Medications Prior to Visit  Medication Sig Dispense Refill  . acetaminophen (TYLENOL) 325 MG tablet Take 650 mg by mouth every 6 (six) hours as needed.     . Cholecalciferol 1000 UNITS tablet Take 1,000 Units by mouth daily. Pt is taking 6,000 IUS daily    . clobetasol (TEMOVATE) 0.05 % external solution APPLY DAILY UNTIL RASH IS CLEAR 50 mL 3  . Melatonin 3 MG  CAPS Take 3 mg by mouth at bedtime.    Marland Kitchen omeprazole (PRILOSEC) 40 MG capsule TAKE 1 CAPSULE BY MOUTH ONCE DAILY 90 capsule 1  . rosuvastatin (CRESTOR) 10 MG tablet TAKE ONE TABLET BY MOUTH EVERY DAY 90 tablet 1  . amLODipine (NORVASC) 5 MG tablet TAKE ONE TABLET BY MOUTH EVERY DAY 90 tablet 1  . citalopram (CELEXA) 20 MG tablet TAKE ONE TABLET BY MOUTH EVERY DAY 90 tablet 1  . gabapentin (NEURONTIN) 100 MG capsule Take 2 capsules (200 mg total) by mouth at bedtime. 180 capsule 0  . hydrochlorothiazide (HYDRODIURIL) 25 MG tablet Take 1 tablet (25 mg total) by mouth daily. 90 tablet 3  . metoprolol succinate (TOPROL-XL) 25 MG 24 hr tablet TAKE 1 TABLET BY MOUTH WITH OR IMMEDIATLEY FOLLOWING A MEAL 90 tablet 1  . ondansetron (ZOFRAN ODT) 4 MG disintegrating tablet Take 1 tablet (4 mg total) by mouth every 8 (eight) hours as needed for nausea or vomiting. (Patient not taking: Reported on 12/10/2019) 20 tablet 0  . b complex vitamins tablet Take 1 tablet by mouth daily. (Patient not taking: Reported on 12/10/2019)     No facility-administered medications prior to visit.    Review of Systems   Patient denies headache, fevers, malaise, unintentional weight loss, skin rash, eye pain, sinus congestion  and sinus pain, sore throat, dysphagia,  hemoptysis , cough, dyspnea, wheezing, chest pain, palpitations, orthopnea, edema, continued  abdominal pain, nausea, melena, diarrhea, constipation, flank pain, dysuria, hematuria, urinary  Frequency, nocturia, numbness, tingling, seizures,  Focal weakness, Loss of consciousness,  Tremor, insomnia, depression, anxiety, and suicidal ideation.      Objective:  BP 122/78 (BP Location: Left Arm, Patient Position: Sitting, Cuff Size: Normal)   Pulse (!) 55   Temp (!) 97.4 F (36.3 C) (Oral)   Resp 16   Ht 5\' 2"  (1.575 m)   Wt 175 lb 6.4 oz (79.6 kg)   SpO2 99%   BMI 32.08 kg/m   Physical Exam  General appearance: alert, cooperative and appears stated  age Head: Normocephalic, without obvious abnormality, atraumatic Eyes: conjunctivae/corneas clear. PERRL, EOM's intact. Fundi benign. Ears: normal TM's and external ear canals both ears Nose: Nares normal. Septum midline. Mucosa normal. No drainage or sinus tenderness. Throat: lips, mucosa, and tongue normal; teeth and gums normal Neck: no adenopathy, no carotid bruit, no JVD, supple, symmetrical, trachea midline and thyroid not enlarged, symmetric, no tenderness/mass/nodules Lungs: clear to auscultation bilaterally Breasts: normal appearance, no masses or tenderness Heart: regular rate and rhythm, S1, S2 normal, no murmur, click, rub or gallop Abdomen: soft, non-tender; bowel sounds normal; no masses,  no organomegaly Extremities: extremities normal, atraumatic, no cyanosis or edema Pulses: 2+ and symmetric Skin: Skin color, texture, turgor normal. No rashes or lesions Neurologic: Alert and oriented X 3, normal strength and tone. Normal symmetric reflexes. Normal coordination and gait.    Assessment & Plan:   Problem List Items Addressed This Visit      Unprioritized   Hyperlipidemia LDL goal <130    Managed with rosuvastatin.  lipitor caused myalgias.  LFTS are normal.   Lab Results  Component Value Date   CHOL 157 12/10/2019   HDL 51.00 12/10/2019   LDLCALC 77 12/10/2019   TRIG 149.0 12/10/2019   CHOLHDL 3 12/10/2019   Lab Results  Component Value Date   ALT 23 12/10/2019   AST 18 12/10/2019   ALKPHOS 57 12/10/2019   BILITOT 0.9 12/10/2019         Relevant Orders   Comprehensive metabolic panel (Completed)   Essential hypertension   Relevant Orders   Lipid panel (Completed)   Obesity    A total of 40 minutes of face to face time was spent with patient more than half of which was spent in counselling about her approach to weight loss and the need for regular exercise and a sensible diet with portion reduction .  Recommended using a personal trainer or "life coach" to  guide her efforts.        Relevant Orders   TSH (Completed)   Encounter for preventive health examination    age appropriate education and counseling updated, referrals for preventative services and immunizations addressed, dietary and smoking counseling addressed, most recent labs reviewed.  I have personally reviewed and have noted:  1) the patient's medical and social history 2) The pt's use of alcohol, tobacco, and illicit drugs 3) The patient's current medications and supplements 4) Functional ability including ADL's, fall risk, home safety risk, hearing and visual impairment 5) Diet and physical activities 6) Evidence for depression or mood disorder 7) The patient's height, weight, and BMI have been recorded in the chart  I have made referrals, and provided counseling and education based on review of the above      Colon cancer screening -  Primary   Relevant Orders   Cologuard   Acoustic neuroma [facility])    Managed with serial MRI and hearing tests by ENT.  Progressive hearing loss noted.  MRI scheduled       CKD (chronic kidney disease) stage 3, GFR 30-59 ml/min    GFR stable.  Nephrology evaluation in March 2020 , will recommend annual follow up.      OSA (obstructive sleep apnea)    She has decided after discussing with Tami Ribas to go ahead with the titration study . Needs CPAP TITRATION STUDY ORDERED       Relevant Orders   Cpap titration   Esophageal dysphagia    She reports an episode of coughing or choking with nearly every meal . Modified swallow study ordered       Relevant Orders   SLP modified barium swallow    Other Visit Diagnoses    Prediabetes       Relevant Orders   Hemoglobin A1c   Breast cancer screening by mammogram       Relevant Orders   MM 3D SCREEN BREAST BILATERAL   Postmenopausal estrogen deficiency       Relevant Orders   DG Bone Density      I have discontinued Haydee Salter "Debbie"'s b complex vitamins. I am also having her  maintain her Cholecalciferol, Melatonin, acetaminophen, ondansetron, clobetasol, rosuvastatin, and omeprazole.  No orders of the defined types were placed in this encounter.   Medications Discontinued During This Encounter  Medication Reason  . b complex vitamins tablet Patient Preference    Follow-up: No follow-ups on file.   [name], MD

## 2019-12-10 NOTE — Assessment & Plan Note (Signed)
She has decided after discussing with Tami Ribas to go ahead with the titration study . Needs CPAP TITRATION STUDY ORDERED

## 2019-12-10 NOTE — Patient Instructions (Addendum)
Try the Optavia diet.  Your annual mammogram has been ordered.  You are encouraged (required) to call to make your appointment at [facility]  [phone]  EXA ordered  CPAP titration study ordered  Modified barium swallow ordered   Health Maintenance After Age 71 After age 56, you are at a higher risk for certain long-term diseases and infections as well as injuries from falls. Falls are a major cause of broken bones and head injuries in people who are older than age 53. Getting regular preventive care can help to keep you healthy and well. Preventive care includes getting regular testing and making lifestyle changes as recommended by your health care provider. Talk with your health care provider about:  Which screenings and tests you should have. A screening is a test that checks for a disease when you have no symptoms.  A diet and exercise plan that is right for you. What should I know about screenings and tests to prevent falls? Screening and testing are the best ways to find a health problem early. Early diagnosis and treatment give you the best chance of managing medical conditions that are common after age 34. Certain conditions and lifestyle choices may make you more likely to have a fall. Your health care provider may recommend:  Regular vision checks. Poor vision and conditions such as cataracts can make you more likely to have a fall. If you wear glasses, make sure to get your prescription updated if your vision changes.  Medicine review. Work with your health care provider to regularly review all of the medicines you are taking, including over-the-counter medicines. Ask your health care provider about any side effects that may make you more likely to have a fall. Tell your health care provider if any medicines that you take make you feel dizzy or sleepy.  Osteoporosis screening. Osteoporosis is a condition that causes the bones to get weaker. This can make the bones weak and cause  them to break more easily.  Blood pressure screening. Blood pressure changes and medicines to control blood pressure can make you feel dizzy.  Strength and balance checks. Your health care provider may recommend certain tests to check your strength and balance while standing, walking, or changing positions.  Foot health exam. Foot pain and numbness, as well as not wearing proper footwear, can make you more likely to have a fall.  Depression screening. You may be more likely to have a fall if you have a fear of falling, feel emotionally low, or feel unable to do activities that you used to do.  Alcohol use screening. Using too much alcohol can affect your balance and may make you more likely to have a fall. What actions can I take to lower my risk of falls? General instructions  Talk with your health care provider about your risks for falling. Tell your health care provider if: ? You fall. Be sure to tell your health care provider about all falls, even ones that seem minor. ? You feel dizzy, sleepy, or off-balance.  Take over-the-counter and prescription medicines only as told by your health care provider. These include any supplements.  Eat a healthy diet and maintain a healthy weight. A healthy diet includes low-fat dairy products, low-fat (lean) meats, and fiber from whole grains, beans, and lots of fruits and vegetables. Home safety  Remove any tripping hazards, such as rugs, cords, and clutter.  Install safety equipment such as grab bars in bathrooms and safety rails on stairs.  Keep  rooms and walkways well-lit. Activity   Follow a regular exercise program to stay fit. This will help you maintain your balance. Ask your health care provider what types of exercise are appropriate for you.  If you need a cane or walker, use it as recommended by your health care provider.  Wear supportive shoes that have nonskid soles. Lifestyle  Do not drink alcohol if your health care provider  tells you not to drink.  If you drink alcohol, limit how much you have: ? 0-1 drink a day for women. ? 0-2 drinks a day for men.  Be aware of how much alcohol is in your drink. In the [place] one drink equals one typical bottle of beer (12 oz), one-half glass of wine (5 oz), or one shot of hard liquor (1 oz).  Do not use any products that contain nicotine or tobacco, such as cigarettes and e-cigarettes. If you need help quitting, ask your health care provider. Summary  Having a healthy lifestyle and getting preventive care can help to protect your health and wellness after age 58.  Screening and testing are the best way to find a health problem early and help you avoid having a fall. Early diagnosis and treatment give you the best chance for managing medical conditions that are more common for people who are older than age 87.  Falls are a major cause of broken bones and head injuries in people who are older than age 86. Take precautions to prevent a fall at home.  Work with your health care provider to learn what changes you can make to improve your health and wellness and to prevent falls. This information is not intended to replace advice given to you by your health care provider. Make sure you discuss any questions you have with your health care provider. Document Revised: 09/07/2018 Document Reviewed: 03/30/2017 Elsevier Patient Education  2020 [employer].

## 2019-12-11 DIAGNOSIS — R1310 Dysphagia, unspecified: Secondary | ICD-10-CM | POA: Insufficient documentation

## 2019-12-11 DIAGNOSIS — R1319 Other dysphagia: Secondary | ICD-10-CM | POA: Insufficient documentation

## 2019-12-11 HISTORY — DX: Other dysphagia: R13.19

## 2019-12-11 NOTE — Assessment & Plan Note (Addendum)
GFR stable.  Nephrology evaluation in March 2020 , will recommend annual follow up.

## 2019-12-11 NOTE — Assessment & Plan Note (Signed)
Managed with rosuvastatin.  lipitor caused myalgias.  LFTS are normal.   Lab Results  Component Value Date   CHOL 157 12/10/2019   HDL 51.00 12/10/2019   LDLCALC 77 12/10/2019   TRIG 149.0 12/10/2019   CHOLHDL 3 12/10/2019   Lab Results  Component Value Date   ALT 23 12/10/2019   AST 18 12/10/2019   ALKPHOS 57 12/10/2019   BILITOT 0.9 12/10/2019

## 2019-12-11 NOTE — Assessment & Plan Note (Signed)
She reports an episode of coughing or choking with nearly every meal . Modified swallow study ordered

## 2019-12-11 NOTE — Assessment & Plan Note (Signed)
A total of 40 minutes of face to face time was spent with patient more than half of which was spent in counselling about her approach to weight loss and the need for regular exercise and a sensible diet with portion reduction .  Recommended using a personal trainer or "life coach" to guide her efforts.

## 2019-12-11 NOTE — Assessment & Plan Note (Signed)
Managed with serial MRI and hearing tests by ENT.  Progressive hearing loss noted.  MRI scheduled

## 2019-12-11 NOTE — Assessment & Plan Note (Signed)

## 2019-12-17 ENCOUNTER — Other Ambulatory Visit: Payer: Self-pay | Admitting: Unknown Physician Specialty

## 2019-12-17 DIAGNOSIS — D333 Benign neoplasm of cranial nerves: Secondary | ICD-10-CM

## 2019-12-25 DIAGNOSIS — H903 Sensorineural hearing loss, bilateral: Secondary | ICD-10-CM | POA: Diagnosis not present

## 2020-01-01 ENCOUNTER — Ambulatory Visit: Payer: Medicare PPO

## 2020-01-06 ENCOUNTER — Encounter: Payer: Self-pay | Admitting: Internal Medicine

## 2020-01-06 DIAGNOSIS — Z1211 Encounter for screening for malignant neoplasm of colon: Secondary | ICD-10-CM | POA: Diagnosis not present

## 2020-01-06 LAB — COLOGUARD: Cologuard: POSITIVE — AB

## 2020-01-09 ENCOUNTER — Ambulatory Visit: Payer: Self-pay

## 2020-01-09 ENCOUNTER — Ambulatory Visit
Admission: RE | Admit: 2020-01-09 | Discharge: 2020-01-09 | Disposition: A | Discharge status: Home / Self Care | Payer: Medicare PPO | Source: Ambulatory Visit | Attending: Internal Medicine | Admitting: Internal Medicine

## 2020-01-09 ENCOUNTER — Ambulatory Visit: Payer: Medicare PPO | Admitting: Family Medicine

## 2020-01-09 ENCOUNTER — Other Ambulatory Visit: Payer: Self-pay

## 2020-01-09 ENCOUNTER — Ambulatory Visit
Admission: RE | Admit: 2020-01-09 | Discharge: 2020-01-09 | Disposition: A | Discharge status: Home / Self Care | Payer: Medicare PPO | Source: Ambulatory Visit | Attending: Unknown Physician Specialty | Admitting: Unknown Physician Specialty

## 2020-01-09 ENCOUNTER — Encounter: Payer: Self-pay | Admitting: Family Medicine

## 2020-01-09 VITALS — BP 100/74 | HR 90 | Ht 62.0 in | Wt 175.8 lb

## 2020-01-09 DIAGNOSIS — M25511 Pain in right shoulder: Secondary | ICD-10-CM

## 2020-01-09 DIAGNOSIS — Z780 Asymptomatic menopausal state: Secondary | ICD-10-CM | POA: Insufficient documentation

## 2020-01-09 DIAGNOSIS — D333 Benign neoplasm of cranial nerves: Secondary | ICD-10-CM | POA: Diagnosis not present

## 2020-01-09 DIAGNOSIS — G8929 Other chronic pain: Secondary | ICD-10-CM

## 2020-01-09 DIAGNOSIS — I6782 Cerebral ischemia: Secondary | ICD-10-CM | POA: Diagnosis not present

## 2020-01-09 DIAGNOSIS — G9389 Other specified disorders of brain: Secondary | ICD-10-CM | POA: Diagnosis not present

## 2020-01-09 DIAGNOSIS — R220 Localized swelling, mass and lump, head: Secondary | ICD-10-CM | POA: Diagnosis not present

## 2020-01-09 MED ORDER — GADOBUTROL 1 MMOL/ML IV SOLN
7.0000 mL | Freq: Once | INTRAVENOUS | Status: AC | PRN
  Administered 2020-01-09: 7 mL via INTRAVENOUS

## 2020-01-09 NOTE — Progress Notes (Signed)
I, Kathy Long, LAT, ATC, am serving as scribe for Dr. [name].  Kathy Long is a 69 y.o. female who presents to [place] at [facility] today for f/u of R shoulder pain.  She was last seen by [name] on 10/17/19 for f/u of R shoulder/ACJ pain and had a R GHJ injection and was prescribed Gabapentin.  Since her last visit, pt reports that the injection helped for a few weeks but has been hurting since and has been worsening.  Her R shoulder pain is worse w/ attempting to reach behind her body.  She denies any radiating pain into her R arm.  Diagnostic imaging: C-spine and R shoulder XR- 10/17/19  Pertinent review of systems: No fevers or chills  Relevant historical information: Hypertension, sleep apnea, acoustic neuroma   Exam:  BP 100/74 (BP Location: Left Arm, Patient Position: Sitting, Cuff Size: Normal)   Pulse 90   Ht 5\' 2"  (1.575 m)   Wt 175 lb 12.8 oz (79.7 kg)   SpO2 97%   BMI 32.15 kg/m  General: Well Developed, well nourished, and in no acute distress.   MSK: Right shoulder normal-appearing Tender palpation AC joint. Range of motion abduction 130 degrees.  External rotation full.  Internal rotation to lumbar spine. Strength 4/5 abduction.  5/5 external and internal rotation. Positive Hawkins and Neer's test.  Positive empty can test. Negative Yergason's and speeds test. Positive crossover arm compression test.    Lab and Radiology Results EXAM: RIGHT SHOULDER - 2+ VIEW  COMPARISON:  None.  FINDINGS: No fracture or malalignment. Mild AC joint degenerative change. Right lung apex is clear  IMPRESSION: No acute osseous abnormality.  Mild AC joint degenerative change   Electronically Signed   By: Donavan Foil M.D.   On: 10/17/2019 23:55  I, [name], personally (independently) visualized and performed the interpretation of the images attached in this note.     Assessment and Plan: 69 y.o. female with right shoulder  pain failure to improve with conservative management including home exercise program as well as repeated injections.  At this point I think MRI is reasonable.  This will help further characterize cause of pain and dictate further treatment.  Additionally proceed with physical therapy she certainly does have periscapular pain and dysfunction contributing to her overall pain.  Plan to recheck after MRI.  This is the patient's first visit with me this is a new problem to me today.  Orders Placed This Encounter  Procedures  . MR SHOULDER RIGHT WO CONTRAST    Standing Status:   Future    Standing Expiration Date:   01/08/2021    Order Specific Question:   What is the patient's sedation requirement?    Answer:   No Sedation    Order Specific Question:   Does the patient have a pacemaker or implanted devices?    Answer:   No    Order Specific Question:   Preferred imaging location?    Answer:   [facility] (table limit-350lbs)    Order Specific Question:   Radiology Contrast Protocol - do NOT remove file path    Answer:   [email or web address]  . Ambulatory referral to Physical Therapy    Referral Priority:   Routine    Referral Type:   Physical Medicine    Referral Reason:   Specialty Services Required    Requested Specialty:   Physical Therapy   No orders of the defined types were placed in  this encounter.    Discussed warning signs or symptoms. Please see discharge instructions. Patient expresses understanding.   The above documentation has been reviewed and is accurate and complete [name], M.D.

## 2020-01-09 NOTE — Patient Instructions (Signed)
Thank you for coming in today. Plan for MRI and PT.  Please let me know if you have a problem with either.  In general I think it is a good idea to recheck in person after MRI to see the pictures and read the report together and plan.    Try voltaren gel.   Try heat and TENS unit.   TENS UNIT: This is helpful for muscle pain and spasm.   Search and Purchase a TENS 7000 2nd edition at  www.tenspros.com or www.[employer].com It should be less than $30.     TENS unit instructions: Do not shower or bathe with the unit on Turn the unit off before removing electrodes or batteries If the electrodes lose stickiness add a drop of water to the electrodes after they are disconnected from the unit and place on plastic sheet. If you continued to have difficulty, call the TENS unit company to purchase more electrodes. Do not apply lotion on the skin area prior to use. Make sure the skin is clean and dry as this will help prolong the life of the electrodes. After use, always check skin for unusual red areas, rash or other skin difficulties. If there are any skin problems, does not apply electrodes to the same area. Never remove the electrodes from the unit by pulling the wires. Do not use the TENS unit or electrodes other than as directed. Do not change electrode placement without consultating your therapist or physician. Keep 2 fingers with between each electrode. Wear time ratio is 2:1, on to off times.    For example on for 30 minutes off for 15 minutes and then on for 30 minutes off for 15 minutes

## 2020-01-11 ENCOUNTER — Ambulatory Visit: Payer: Medicare PPO | Attending: Neurology

## 2020-01-11 DIAGNOSIS — G4733 Obstructive sleep apnea (adult) (pediatric): Secondary | ICD-10-CM | POA: Diagnosis not present

## 2020-01-12 LAB — COLOGUARD: COLOGUARD: POSITIVE — AB

## 2020-01-14 ENCOUNTER — Other Ambulatory Visit: Payer: Self-pay

## 2020-01-15 NOTE — Assessment & Plan Note (Signed)
T scores stable in hips,  But forearm T score now -2.8  (was -1.8 in 2014)

## 2020-01-23 ENCOUNTER — Encounter: Payer: Self-pay | Admitting: Family Medicine

## 2020-01-23 DIAGNOSIS — L821 Other seborrheic keratosis: Secondary | ICD-10-CM | POA: Diagnosis not present

## 2020-01-23 DIAGNOSIS — Z85820 Personal history of malignant melanoma of skin: Secondary | ICD-10-CM | POA: Diagnosis not present

## 2020-01-23 DIAGNOSIS — D2272 Melanocytic nevi of left lower limb, including hip: Secondary | ICD-10-CM | POA: Diagnosis not present

## 2020-01-23 DIAGNOSIS — D2261 Melanocytic nevi of right upper limb, including shoulder: Secondary | ICD-10-CM | POA: Diagnosis not present

## 2020-01-23 DIAGNOSIS — Z85828 Personal history of other malignant neoplasm of skin: Secondary | ICD-10-CM | POA: Diagnosis not present

## 2020-01-23 DIAGNOSIS — D2262 Melanocytic nevi of left upper limb, including shoulder: Secondary | ICD-10-CM | POA: Diagnosis not present

## 2020-01-23 DIAGNOSIS — D485 Neoplasm of uncertain behavior of skin: Secondary | ICD-10-CM | POA: Diagnosis not present

## 2020-01-23 DIAGNOSIS — D225 Melanocytic nevi of trunk: Secondary | ICD-10-CM | POA: Diagnosis not present

## 2020-01-28 ENCOUNTER — Telehealth: Payer: Self-pay | Admitting: Internal Medicine

## 2020-01-28 DIAGNOSIS — G4733 Obstructive sleep apnea (adult) (pediatric): Secondary | ICD-10-CM

## 2020-01-28 DIAGNOSIS — R195 Other fecal abnormalities: Secondary | ICD-10-CM

## 2020-01-28 DIAGNOSIS — Z1211 Encounter for screening for malignant neoplasm of colon: Secondary | ICD-10-CM

## 2020-01-28 NOTE — Telephone Encounter (Signed)
CPAP titration study report received,  I have printed the DME order for CPAP

## 2020-01-28 NOTE — Assessment & Plan Note (Signed)
CPAP study done.  DME for PAP ordered

## 2020-01-28 NOTE — Telephone Encounter (Signed)
Kathy Long called they need the baseline study faxed over to [phone]

## 2020-01-29 NOTE — Telephone Encounter (Signed)
Faxed

## 2020-02-05 DIAGNOSIS — R195 Other fecal abnormalities: Secondary | ICD-10-CM | POA: Insufficient documentation

## 2020-02-05 HISTORY — DX: Other fecal abnormalities: R19.5

## 2020-02-05 NOTE — Assessment & Plan Note (Signed)
The results of patient's cologuard is  Positive.   I  will refer her to GI for further evaluation .

## 2020-02-09 ENCOUNTER — Other Ambulatory Visit: Payer: Self-pay | Admitting: Internal Medicine

## 2020-02-09 DIAGNOSIS — R195 Other fecal abnormalities: Secondary | ICD-10-CM

## 2020-02-14 ENCOUNTER — Ambulatory Visit
Admission: RE | Admit: 2020-02-14 | Discharge: 2020-02-14 | Disposition: A | Discharge status: Home / Self Care | Payer: Medicare PPO | Source: Ambulatory Visit | Attending: Family Medicine | Admitting: Family Medicine

## 2020-02-14 ENCOUNTER — Other Ambulatory Visit: Payer: Self-pay

## 2020-02-14 DIAGNOSIS — G8929 Other chronic pain: Secondary | ICD-10-CM | POA: Insufficient documentation

## 2020-02-14 DIAGNOSIS — M25511 Pain in right shoulder: Secondary | ICD-10-CM | POA: Diagnosis not present

## 2020-02-15 NOTE — Progress Notes (Signed)
MRI shows medium rotator cuff tendinitis and medium arthritis of the acromioclavicular joint (the small joint at the top of the shoulder).  It also shows some shoulder bursitis.  These issues typically are pretty well manageable with injection and physical therapy.  If not improved with physical therapy next step would probably be surgical consultation for subacromial decompression (cleanout surgery).  If you would like to you can schedule an appointment with myself or [name] to go over the results in further detail and look at the pictures together as well as discuss the plan further.

## 2020-02-19 ENCOUNTER — Telehealth: Payer: Self-pay | Admitting: Internal Medicine

## 2020-02-19 NOTE — Telephone Encounter (Signed)
Lft pt vm to call ofc to sch SLP barium swallow.

## 2020-02-26 ENCOUNTER — Encounter: Payer: Self-pay | Admitting: Family Medicine

## 2020-02-26 NOTE — Telephone Encounter (Signed)
Called pt, starting PT on Monday. Will wait for PT eval and then contact us to schedule with Tamala Julian.

## 2020-02-28 ENCOUNTER — Other Ambulatory Visit: Payer: Self-pay

## 2020-02-28 ENCOUNTER — Ambulatory Visit: Payer: Medicare PPO | Attending: Family Medicine

## 2020-02-28 DIAGNOSIS — G8929 Other chronic pain: Secondary | ICD-10-CM | POA: Insufficient documentation

## 2020-02-28 DIAGNOSIS — M542 Cervicalgia: Secondary | ICD-10-CM | POA: Insufficient documentation

## 2020-02-28 DIAGNOSIS — M25511 Pain in right shoulder: Secondary | ICD-10-CM | POA: Diagnosis not present

## 2020-02-28 NOTE — Patient Instructions (Signed)
Access Code: [email or web address] URL: [email or web address] Date: 02/28/2020 Prepared by: Joneen Boers  Exercises Seated Scapular Retraction - 5 x daily - 7 x weekly - 3 sets - 10 reps - 5 seconds hold Seated Cervical Retraction - 5 x daily - 7 x weekly - 3 sets - 10 reps - 5 seconds hold

## 2020-02-28 NOTE — Therapy (Signed)
[facility] [address], [place], [address] Phone: [phone]   Fax:  [phone]  Physical Therapy Evaluation  Patient Details  Name: Kathy Long MRN: [id number] Date of Birth: October 31, 1950 Referring Provider (PT): [name], MD   Encounter Date: 02/28/2020    PT End of Session - 02/28/20 1552    Visit Number 1    Number of Visits 13    Date for PT Re-Evaluation 04/10/20    Authorization Type 1    Authorization Time Period of 10 progress report    PT Start Time 1553    PT Stop Time 1640    PT Time Calculation (min) 47 min    Activity Tolerance Patient tolerated treatment well    Behavior During Therapy [facility] for tasks assessed/performed           Past Medical History:  Diagnosis Date  . Acoustic neuroma (HCC)    left ear  . Arthritis    lower back, hands  . Cancer (HCC)    melanoma, shoulder  . Depression   . GERD (gastroesophageal reflux disease)   . Hyperlipidemia   . Hyperlipidemia associated with type 2 diabetes mellitus ([place]) 12/10/2019  . Hypertension   . 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     Past Surgical History:  Procedure Laterality Date  . APPENDECTOMY  2010  . BACK SURGERY     disectomy lumbar, scar tissue  . BREAST CYST EXCISION Right 1970   [place]  . CATARACT EXTRACTION W/PHACO Left 04/12/2018   Procedure: CATARACT EXTRACTION PHACO AND INTRAOCULAR LENS PLACEMENT ([place])  LEFT TORIC LENS;  Surgeon: Leandrew Koyanagi, MD;  Location: [facility];  Service: Ophthalmology;  Laterality: Left;  . CATARACT EXTRACTION W/PHACO Right 05/10/2018   Procedure: CATARACT EXTRACTION PHACO AND INTRAOCULAR LENS PLACEMENT ([place])  RIGHT TORIC LENS;  Surgeon: Leandrew Koyanagi, MD;  Location: [facility];  Service: Ophthalmology;  Laterality: Right;  DIABETIC  . CHOLECYSTECTOMY N/A 07/05/2017   Procedure: LAPAROSCOPIC CHOLECYSTECTOMY WITH INTRAOPERATIVE CHOLANGIOGRAM;  Surgeon:  Robert Bellow, MD;  Location: [facility];  Service: General;  Laterality: N/A;  . COLONOSCOPY     2009 and color gaurd in 2018  . ERCP N/A 07/12/2017   Procedure: ENDOSCOPIC RETROGRADE CHOLANGIOPANCREATOGRAPHY (ERCP);  Surgeon: Lucilla Lame, MD;  [facility] END[facility];  Service: Endoscopy;  Laterality: N/A;  . ERCP N/A 10/04/2017   Procedure: ENDOSCOPIC RETROGRADE CHOLANGIOPANCREATOGRAPHY (ERCP);  Surgeon: Lucilla Lame, MD;  Location: [facility] ENDOSCOPY;  Service: Endoscopy;  Laterality: N/A;  . FOOT SURGERY     x 2  . MELANOMA EXCISION Left 2000  . PALATOPLASTY N/A 04/08/2015   Procedure: PALATOPLASTY;  Surgeon: Beverly Gust, MD;  Location: [facility];  Service: ENT;  Laterality: N/A;  . TONSILLECTOMY N/A 04/08/2015   Procedure: TONSILLECTOMY;  Surgeon: Beverly Gust, MD;  Location: [facility];  Service: ENT;  Laterality: N/A;  . TUBAL LIGATION      There were no vitals filed for this visit.    Subjective Assessment - 02/28/20 1557    Subjective R shoulder pain: 0/10 at rest currently, 8/10 at most for the past 3 months when pt moves her R arm wrong.    Pertinent History R shoulder pain. Gradual onset, 6 months ago. Dr. Derrel Nip is her PCP. Pt states that she has pain in R shoulder and L hip. Was referred to [name]. R shoulder was her main problem at the time. Pt has a hx  of 2 back surgeries and has pain down her leg which comes and goes. Was given an injection R shoulder which helped. Was also given gabapentin which did not help. Pain return 4 weeks after the shot. Pt is L hand dominant. 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    Patient Stated Goals Live without the shoulder pain.    Currently in Pain? No/denies    Pain Score 0-No pain    Pain Location Shoulder    Pain Orientation Right    Pain Type Chronic pain    Pain Onset More than a  month ago    Pain Frequency Occasional    Aggravating Factors  R shoulder: reaching back (horizontal abduction as well as shoulder extension with abduction and shoulder shrug), R S/L    Pain Relieving Factors R shoulder: TENS, heat, tylenol, pain cream              [facility] PT Assessment - 02/28/20 1556      Assessment   Medical Diagnosis R shoulder pain    Referring Provider (PT) [name], MD    Onset Date/Surgical Date 01/09/20    Prior Therapy none for R shoulder      Precautions   Precaution Comments no known precautions      Restrictions   Other Position/Activity Restrictions no known restrictions      Balance Screen   Has the patient fallen in the past 6 months No    Has the patient had a decrease in activity level because of a fear of falling?  Yes    Is the patient reluctant to leave their home because of a fear of falling?  No      Prior Function   Vocation Requirements PLOF: less difficulty reaching      Posture/Postural Control   Posture Comments protracted neck, B protrated shoulders R > L, R lateral shift,       AROM   Right Shoulder Flexion 142 Degrees   with shldr jt pain; 173 degrees AAROM   Right Shoulder ABduction 176 Degrees    Cervical Flexion WFL    Cervical Extension WFL with R lateral neck pain wiht over pressure    Cervical - Right Side Bend WFL with R shoulder/upper trap pain    Cervical - Left Side Bend WFL with R shoulder/upper trap pain    Cervical - Right Rotation WFL with R lateral neck and R upper trap/shoulder pain.     Cervical - Left Rotation [facility]      Strength   Right Shoulder Flexion 4+/5    Right Shoulder ABduction 5/5    Right Shoulder Internal Rotation 4+/5    Right Shoulder External Rotation 4/5    Left Shoulder Flexion 5/5    Left Shoulder ABduction 5/5    Left Shoulder Internal Rotation 5/5    Left Shoulder External Rotation 4+/5    Right Elbow Flexion 4/5    Right Elbow Extension 4/5    Left Elbow Flexion 4/5    Left  Elbow Extension 4+/5    Right Wrist Extension 4+/5    Left Wrist Extension 4+/5      Palpation   Palpation comment TTP R supraspinatus, distal scalene. Tense R upper trap muscle      Special Tests   Other special tests R shoulder: (-)  Empty can, (+) Michel Bickers. (-) Neer's impingement.                       Objective measurements completed on examination: See above findings.       TTP R supraspinatus, distal scalene Tense R upper trap muscle  No neck surgery hx  (-) Empty can R         Medbridge Access Code [email or web address]  Therapeutic exercise  Pt accidentally filled out HAND FOTO. Pt needs to fill out SHOULDER FOTO next visit   Seated B scapular retraction 10x5 seconds for 2 sets Seated chin tuck with scapular retraction 10x5 seconds   Reviewed HEP. Pt demonstrated and verbalized understanding. Handout provided.   Improved exercise technique, movement at target joints, use of target muscles after mod verbal, visual, tactile cues.   Response to treatment No pain after session. No pain during exercises.     Clinical impression Pt is a 69 year old female who came to physical therapy secondary to R shoulder pain. She also presents with poor posture, scapular weakness, limited R shoulder flexion AROM, reproduction of symptoms with cervical AROM as well as R shoulder impingement test suggesting both cervical and shoulder joint involvement in her pain; R shoulder ER weakness, TTP, and difficulty performing functional tasks which involve reaching. Pt will benefit from skilled physical therapy services to address the aforementioned deficits.      PT Education - 02/28/20 1753    Education Details ther-ex. HEP    Person(s) Educated Patient    Methods Explanation;Demonstration;Tactile cues;Verbal cues;Handout    Comprehension Returned demonstration;Verbalized understanding            PT Short Term Goals - 02/28/20 1803      PT SHORT TERM GOAL #1    Title Patient will be independent with her initial HEP to decrease pain, improve strenght and ability to reach with less difficulty.    Baseline Pt has started her HEP (02/28/2020)    Time 3    Period Weeks    Status New    Target Date 03/20/20             PT Long Term Goals - 02/28/20 1804      PT LONG TERM GOAL #1   Title Patient will have a decrease in R shoulder pain to 4/10 or less at most to promote ability to reach, as well look around more comfortably.    Baseline 8/10 R shoulder pain at most for the past 3 months (02/28/2020)    Time 6    Period Weeks    Status New    Target Date 04/10/20      PT LONG TERM GOAL #2   Title Patient will improve her R shoulder ER strength by at least 1/2 MMT grade to promote ability to raise her arm up with less pain.    Baseline 4/5 R shoulder ER (02/28/2020)    Time 6    Period Weeks    Status New    Target Date 04/10/20      PT LONG TERM GOAL #3   Title Patient will improve her FOTO score by at least 10 points as a demonstration of improved function.    Time 6    Period Weeks    Status New    Target Date 04/10/20                  Plan - 02/28/20 1758  Clinical Impression Statement Pt is a 69 year old female who came to physical therapy secondary to R shoulder pain. She also presents with poor posture, scapular weakness, limited R shoulder flexion AROM, reproduction of symptoms with cervical AROM as well as R shoulder impingement test suggesting both cervical and shoulder joint involvement in her pain; R shoulder ER weakness, TTP, and difficulty performing functional tasks which involve reaching. Pt will benefit from skilled physical therapy services to address the aforementioned deficits.    Personal Factors and Comorbidities Age;Comorbidity 3+;Past/Current Experience;Time since onset of injury/illness/exacerbation    Comorbidities Depression, HTN, hx of CA    Examination-Activity Limitations Bathing;Dressing;Sleep;Reach  Overhead    Stability/Clinical Decision Making Stable/Uncomplicated    Clinical Decision Making Low    Rehab Potential Fair    PT Frequency 2x / week    PT Duration 6 weeks    PT Treatment/Interventions Therapeutic activities;Therapeutic exercise;Neuromuscular re-education;Patient/family education;Manual techniques;Dry needling;Electrical Stimulation;Iontophoresis 4mg /ml Dexamethasone    PT Next Visit Plan scapular, rotator cuff strengthening, thoracic extension, manual techniques, modalities PRN    PT Home Exercise Plan Medbridge Access Code [email or web address]    Consulted and Agree with Plan of Care Patient           Patient will benefit from skilled therapeutic intervention in order to improve the following deficits and impairments:  Pain, Postural dysfunction, Impaired UE functional use, Improper body mechanics, Decreased strength, Decreased range of motion  Visit Diagnosis: Chronic right shoulder pain - Plan: PT plan of care cert/re-cert  Cervicalgia - Plan: PT plan of care cert/re-cert     Problem List Patient Active Problem List   Diagnosis Date Noted  . Positive colorectal cancer screening using Cologuard test 02/05/2020  . Esophageal dysphagia 12/11/2019  . Hyperlipidemia LDL goal <100 12/10/2019  . Right shoulder pain 10/17/2019  . Arthritis of right acromioclavicular joint 08/15/2019  . Piriformis syndrome of left side 08/15/2019  . OSA (obstructive sleep apnea) 05/13/2019  . CKD (chronic kidney disease) stage 3, GFR 30-59 ml/min 04/13/2018  . Closed fracture of distal lateral malleolus of left ankle 12/28/2017  . Fitting and adjustment of gastrointestinal appliance and device   . 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 Axillary mass, right 08/06/2017  . S/P laparoscopic cholecystectomy 07/19/2017  . Hospital discharge follow-up 07/19/2017  . Postoperative bile leak   . Acoustic neuroma ([place]) 06/12/2017  . Colon cancer screening 03/08/2017  . Nocturia 12/09/2016  .  Major depressive disorder, 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[place]) 06/05/2016  . Encounter for preventive health examination 03/07/2015  . Incisional hernia, without obstruction or gangrene 09/28/2014  . Obesity 09/28/2014  . Anxiety state 11/19/2013  . Gastro-esophageal reflux disease without esophagitis 11/19/2013  . Squamous cell carcinoma of skin of face 10/21/2011  . 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  . Melanoma ([place]) 11/02/2010  . Hyperlipidemia LDL goal <130 04/10/2010  . Essential hypertension 04/10/2010  . Osteopenia 04/10/2010    Joneen Boers PT, DPT   02/28/2020, 6:18 PM  [facility] [address], [place], [address] Phone: [phone]   Fax:  [phone]  Name: Kathy Long MRN: [id number] Date of Birth: Oct 12, 1950

## 2020-03-03 ENCOUNTER — Ambulatory Visit: Payer: Medicare PPO

## 2020-03-04 ENCOUNTER — Other Ambulatory Visit: Payer: Self-pay

## 2020-03-04 ENCOUNTER — Ambulatory Visit: Payer: Medicare PPO | Attending: Family Medicine

## 2020-03-04 DIAGNOSIS — M25511 Pain in right shoulder: Secondary | ICD-10-CM | POA: Insufficient documentation

## 2020-03-04 DIAGNOSIS — M542 Cervicalgia: Secondary | ICD-10-CM | POA: Diagnosis not present

## 2020-03-04 DIAGNOSIS — G8929 Other chronic pain: Secondary | ICD-10-CM | POA: Diagnosis not present

## 2020-03-04 NOTE — Patient Instructions (Signed)
Access Code: [email or web address] URL: [email or web address] Date: 03/04/2020 Prepared by: Joneen Boers  Exercises Seated Scapular Retraction - 5 x daily - 7 x weekly - 3 sets - 10 reps - 5 seconds hold Seated Cervical Retraction - 5 x daily - 7 x weekly - 3 sets - 10 reps - 5 seconds hold Standing Shoulder External Rotation with Resistance - 1 x daily - 7 x weekly - 2 sets - 10 reps

## 2020-03-04 NOTE — Therapy (Signed)
[facility] [address] [place], [address] Phone: [phone]   Fax:  [phone]  Physical Therapy Treatment  Patient Details  Name: Kathy Long MRN: [id number] Date of Birth: Aug 15, 1950 Referring Provider (PT): [name], MD   Encounter Date: 03/04/2020   PT End of Session - 03/04/20 1434    Visit Number 2    Number of Visits 13    Date for PT Re-Evaluation 04/10/20    Authorization Type 2    Authorization Time Period of 10 progress report    PT Start Time 1435    PT Stop Time 1517    PT Time Calculation (min) 42 min    Activity Tolerance Patient tolerated treatment well    Behavior During Therapy [facility] for tasks assessed/performed           Past Medical History:  Diagnosis Date  . Acoustic neuroma (HCC)    left ear  . Arthritis    lower back, hands  . Cancer (HCC)    melanoma, shoulder  . Depression   . GERD (gastroesophageal reflux disease)   . Hyperlipidemia   . Hyperlipidemia associated with type 2 diabetes mellitus ([place]) 12/10/2019  . Hypertension   . 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     Past Surgical History:  Procedure Laterality Date  . APPENDECTOMY  2010  . BACK SURGERY     disectomy lumbar, scar tissue  . BREAST CYST EXCISION Right 1970   axilla  . CATARACT EXTRACTION W/PHACO Left 04/12/2018   Procedure: CATARACT EXTRACTION PHACO AND INTRAOCULAR LENS PLACEMENT ([place])  LEFT TORIC LENS;  Surgeon: Leandrew Koyanagi, MD;  Location: [place];  Service: Ophthalmology;  Laterality: Left;  . CATARACT EXTRACTION W/PHACO Right 05/10/2018   Procedure: CATARACT EXTRACTION PHACO AND INTRAOCULAR LENS PLACEMENT ([place])  RIGHT TORIC LENS;  Surgeon: Leandrew Koyanagi, MD;  Location: [place];  Service: Ophthalmology;  Laterality: Right;  DIABETIC  . CHOLECYSTECTOMY N/A 07/05/2017   Procedure: LAPAROSCOPIC CHOLECYSTECTOMY WITH INTRAOPERATIVE CHOLANGIOGRAM;  Surgeon:  Robert Bellow, MD;  Location: [facility];  Service: General;  Laterality: N/A;  . COLONOSCOPY     2009 and color gaurd in 2018  . ERCP N/A 07/12/2017   Procedure: ENDOSCOPIC RETROGRADE CHOLANGIOPANCREATOGRAPHY (ERCP);  Surgeon: Lucilla Lame, MD;  Location: [facility] ENDOSCOPY;  Service: Endoscopy;  Laterality: N/A;  . ERCP N/A 10/04/2017   Procedure: ENDOSCOPIC RETROGRADE CHOLANGIOPANCREATOGRAPHY (ERCP);  Surgeon: Lucilla Lame, MD;  Location: [facility] ENDOSCOPY;  Service: Endoscopy;  Laterality: N/A;  . FOOT SURGERY     x 2  . MELANOMA EXCISION Left 2000  . PALATOPLASTY N/A 04/08/2015   Procedure: PALATOPLASTY;  Surgeon: Beverly Gust, MD;  Location: [facility];  Service: ENT;  Laterality: N/A;  . TONSILLECTOMY N/A 04/08/2015   Procedure: TONSILLECTOMY;  Surgeon: Beverly Gust, MD;  Location: [facility];  Service: ENT;  Laterality: N/A;  . TUBAL LIGATION      There were no vitals filed for this visit.   Subjective Assessment - 03/04/20 1436    Subjective R shoulder feels good at the moment at rest. 3/10 R shoulder pain with end range shoulder flexion. The R upper trap area pain is better.  Pt states that the PT is helping her shoulder.    Pertinent History R shoulder pain. Gradual onset, 6 months ago. Dr. Derrel Nip is her PCP. Pt states that she has pain in R shoulder and L hip. Was referred to [name]. R  shoulder was her main problem at the time. Pt has a hx of 2 back surgeries and has pain down her leg which comes and goes. Was given an injection R shoulder which helped. Was also given gabapentin which did not help. Pain return 4 weeks after the shot. Pt is L hand dominant. 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    Patient Stated Goals Live without the shoulder pain.    Currently in Pain? Yes    Pain Score 3     Pain Onset More than a month ago               [facility] PT Assessment - 03/04/20 0001      Observation/Other Assessments   Focus on Therapeutic Outcomes (FOTO)  Shoulder FOTO 50                                 PT Education - 03/04/20 1440    Education Details ther-ex, HEP    Person(s) Educated Patient    Methods Explanation;Demonstration;Tactile cues;Verbal cues;Handout    Comprehension Returned demonstration;Verbalized understanding          Objective   No latex allergies  TTP R supraspinatus, distal scalene Tense R upper trap muscle No neck surgery hx (-) Empty can R     Medbridge Access Code [email or web address]    Therapeutic exercise  Seated manually resisted scapular retraction isometrics targeting the lower trap muscle  R 10x3 with 5 seconds   B shoulder ER yellow band 10x2  Decreased R shoulder pain with flexion  Shoulder extension yellow band 5x5 seconds for 2 sets. Difficult   Doorway pectoralis stretch   R 30 seconds x 3  Seated thoracic extension over chair 10x5 seconds for 3 sets  Seated PT manually resisted triceps extension isometrics, elbow flexed to 90 degrees 10x2 with 5 seconds    Improved exercise technique, movement at target joints, use of target muscles after mod verbal, visual, tactile cues.     Manual therapy  Seated STM R upper trap muscle to decrease tension and promote better glenohumeral mechanics  No R shoulder pain with shoulder flexion AROM afterwards      Response to treatment Decreased R shoulder pain with flexion after session.     Clinical impression Decreased R shoulder pain with flexion with treatment to promote scapular retraction, decrease upper trap muscle tension, improve infraspinatus muscle activation as well as thoracic extension to promote better glenohumeral mechanics. Decreased R upper trap pain from neck from previous session. Pt will benefit from continued skilled physical therapy services to decrease pain, improve  strength and function.            PT Short Term Goals - 02/28/20 1803      PT SHORT TERM GOAL #1   Title Patient will be independent with her initial HEP to decrease pain, improve strenght and ability to reach with less difficulty.    Baseline Pt has started her HEP (02/28/2020)    Time 3    Period Weeks    Status New    Target Date 03/20/20             PT Long Term Goals - 02/28/20 1804      PT LONG TERM GOAL #1  Title Patient will have a decrease in R shoulder pain to 4/10 or less at most to promote ability to reach, as well look around more comfortably.    Baseline 8/10 R shoulder pain at most for the past 3 months (02/28/2020)    Time 6    Period Weeks    Status New    Target Date 04/10/20      PT LONG TERM GOAL #2   Title Patient will improve her R shoulder ER strength by at least 1/2 MMT grade to promote ability to raise her arm up with less pain.    Baseline 4/5 R shoulder ER (02/28/2020)    Time 6    Period Weeks    Status New    Target Date 04/10/20      PT LONG TERM GOAL #3   Title Patient will improve her FOTO score by at least 10 points as a demonstration of improved function.    Time 6    Period Weeks    Status New    Target Date 04/10/20                 Plan - 03/04/20 1451    Clinical Impression Statement Decreased R shoulder pain with flexion with treatment to promote scapular retraction, decrease upper trap muscle tension, improve infraspinatus muscle activation as well as thoracic extension to promote better glenohumeral mechanics. Decreased R upper trap pain from neck from previous session. Pt will benefit from continued skilled physical therapy services to decrease pain, improve strength and function.    Personal Factors and Comorbidities Age;Comorbidity 3+;Past/Current Experience;Time since onset of injury/illness/exacerbation    Comorbidities Depression, HTN, hx of CA    Examination-Activity Limitations Bathing;Dressing;Sleep;Reach  Overhead    Stability/Clinical Decision Making Stable/Uncomplicated    Rehab Potential Fair    PT Frequency 2x / week    PT Duration 6 weeks    PT Treatment/Interventions Therapeutic activities;Therapeutic exercise;Neuromuscular re-education;Patient/family education;Manual techniques;Dry needling;Electrical Stimulation;Iontophoresis 4mg /ml Dexamethasone    PT Next Visit Plan scapular, rotator cuff strengthening, thoracic extension, manual techniques, modalities PRN    PT Home Exercise Plan Medbridge Access Code [email or web address]    Consulted and Agree with Plan of Care Patient           Patient will benefit from skilled therapeutic intervention in order to improve the following deficits and impairments:  Pain, Postural dysfunction, Impaired UE functional use, Improper body mechanics, Decreased strength, Decreased range of motion  Visit Diagnosis: Chronic right shoulder pain  Cervicalgia     Problem List Patient Active Problem List   Diagnosis Date Noted  . Positive colorectal cancer screening using Cologuard test 02/05/2020  . Esophageal dysphagia 12/11/2019  . Hyperlipidemia LDL goal <100 12/10/2019  . Right shoulder pain 10/17/2019  . Arthritis of right acromioclavicular joint 08/15/2019  . Piriformis syndrome of left side 08/15/2019  . OSA (obstructive sleep apnea) 05/13/2019  . CKD (chronic kidney disease) stage 3, GFR 30-59 ml/min (HCC) 04/13/2018  . Closed fracture of distal lateral malleolus of left ankle 12/28/2017  . Fitting and adjustment of gastrointestinal appliance and device   . 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 Axillary mass, right 08/06/2017  . S/P laparoscopic cholecystectomy 07/19/2017  . Hospital discharge follow-up 07/19/2017  . Postoperative bile leak   . Acoustic neuroma ([place]) 06/12/2017  . Colon cancer screening 03/08/2017  . Nocturia 12/09/2016  . Major depressive disorder, 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[place]) 06/05/2016  . Encounter for preventive health  examination 03/07/2015  . Incisional hernia,  without obstruction or gangrene 09/28/2014  . Obesity 09/28/2014  . Anxiety state 11/19/2013  . Gastro-esophageal reflux disease without esophagitis 11/19/2013  . Squamous cell carcinoma of skin of face 10/21/2011  . 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  . Melanoma ([place]) 11/02/2010  . Hyperlipidemia LDL goal <130 04/10/2010  . Essential hypertension 04/10/2010  . Osteopenia 04/10/2010     Joneen Boers PT, DPT   03/04/2020, 4:32 PM  [facility] [address] [place], [address] Phone: [phone]   Fax:  [phone]  Name: Kathy Long MRN: [id number] Date of Birth: [birth year]

## 2020-03-06 ENCOUNTER — Ambulatory Visit: Payer: Medicare PPO

## 2020-03-06 ENCOUNTER — Other Ambulatory Visit: Payer: Self-pay

## 2020-03-06 DIAGNOSIS — M25511 Pain in right shoulder: Secondary | ICD-10-CM | POA: Diagnosis not present

## 2020-03-06 DIAGNOSIS — G8929 Other chronic pain: Secondary | ICD-10-CM | POA: Diagnosis not present

## 2020-03-06 DIAGNOSIS — M542 Cervicalgia: Secondary | ICD-10-CM

## 2020-03-06 NOTE — Therapy (Signed)
[facility] [address], [place], [address] Phone: [phone]   Fax:  [phone]  Physical Therapy Treatment  Patient Details  Name: Kathy Long MRN: [id number] Date of Birth: 10/18/1950 Referring Provider (PT): [name], MD   Encounter Date: 03/06/2020   PT End of Session - 03/06/20 1037    Visit Number 3    Number of Visits 13    Date for PT Re-Evaluation 04/10/20    Authorization Type 3    Authorization Time Period of 10 progress report    PT Start Time 1037    PT Stop Time 1132    PT Time Calculation (min) 55 min    Activity Tolerance Patient tolerated treatment well    Behavior During Therapy [facility] for tasks assessed/performed           Past Medical History:  Diagnosis Date  . Acoustic neuroma (HCC)    left ear  . Arthritis    lower back, hands  . Cancer (HCC)    melanoma, shoulder  . Depression   . GERD (gastroesophageal reflux disease)   . Hyperlipidemia   . Hyperlipidemia associated with type 2 diabetes mellitus ([place]) 12/10/2019  . Hypertension   . 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     Past Surgical History:  Procedure Laterality Date  . APPENDECTOMY  2010  . BACK SURGERY     disectomy lumbar, scar tissue  . BREAST CYST EXCISION Right 1970   axilla  . CATARACT EXTRACTION W/PHACO Left 04/12/2018   Procedure: CATARACT EXTRACTION PHACO AND INTRAOCULAR LENS PLACEMENT ([place])  LEFT TORIC LENS;  Surgeon: Leandrew Koyanagi, MD;  Location: [place];  Service: Ophthalmology;  Laterality: Left;  . CATARACT EXTRACTION W/PHACO Right 05/10/2018   Procedure: CATARACT EXTRACTION PHACO AND INTRAOCULAR LENS PLACEMENT ([place])  RIGHT TORIC LENS;  Surgeon: Leandrew Koyanagi, MD;  Location: [place];  Service: Ophthalmology;  Laterality: Right;  DIABETIC  . CHOLECYSTECTOMY N/A 07/05/2017   Procedure: LAPAROSCOPIC CHOLECYSTECTOMY WITH INTRAOPERATIVE CHOLANGIOGRAM;  Surgeon:  Robert Bellow, MD;  Location: [facility];  Service: General;  Laterality: N/A;  . COLONOSCOPY     2009 and color gaurd in 2018  . ERCP N/A 07/12/2017   Procedure: ENDOSCOPIC RETROGRADE CHOLANGIOPANCREATOGRAPHY (ERCP);  Surgeon: Lucilla Lame, MD;  Location: [facility] ENDOSCOPY;  Service: Endoscopy;  Laterality: N/A;  . ERCP N/A 10/04/2017   Procedure: ENDOSCOPIC RETROGRADE CHOLANGIOPANCREATOGRAPHY (ERCP);  Surgeon: Lucilla Lame, MD;  Location: [facility] ENDOSCOPY;  Service: Endoscopy;  Laterality: N/A;  . FOOT SURGERY     x 2  . MELANOMA EXCISION Left 2000  . PALATOPLASTY N/A 04/08/2015   Procedure: PALATOPLASTY;  Surgeon: Beverly Gust, MD;  Location: [facility];  Service: ENT;  Laterality: N/A;  . TONSILLECTOMY N/A 04/08/2015   Procedure: TONSILLECTOMY;  Surgeon: Beverly Gust, MD;  Location: [facility];  Service: ENT;  Laterality: N/A;  . TUBAL LIGATION      There were no vitals filed for this visit.   Subjective Assessment - 03/06/20 1038    Subjective R shoulder is about a 1-2/10 currently (R lateral shoulder and arm), feels like gravity pulls on it.    Pertinent History R shoulder pain. Gradual onset, 6 months ago. Dr. Derrel Nip is her PCP. Pt states that she has pain in R shoulder and L hip. Was referred to [name]. R shoulder was her main problem at the time. Pt has a hx of 2 back surgeries and has  pain down her leg which comes and goes. Was given an injection R shoulder which helped. Was also given gabapentin which did not help. Pain return 4 weeks after the shot. Pt is L hand dominant. 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    Patient Stated Goals Live without the shoulder pain.    Currently in Pain? Yes    Pain Score 2     Pain Location Arm    Pain Descriptors / Indicators Aching;Dull    Pain Onset More than a month ago                                      PT Education - 03/06/20 1136    Education Details ther-ex    Person(s) Educated Patient    Methods Explanation;Demonstration;Tactile cues;Verbal cues    Comprehension Verbalized understanding;Returned demonstration          Objective   No latex allergies  TTP R supraspinatus, distal scalene Tense R upper trap muscle No neck surgery hx (-) Empty can R    Per pt, she has a lipoma inferior angle of L scapula  MedbridgeAccess Code [email or web address]    Therapeutic exercise  Increased R arm symptoms with distraction, decreased sympotoms with axial compression    Sitting with elbows and forearms on table  B shpoulder ER yellow band 10x3   Seated manually resisted scapular retraction isometrics targeting the lower trap muscle             R 10x3 with 5 second holds   L 10x3 with 5 second holds   Seated manually resisted R shoulder ER isometrics 10x5 seconds for 2 sets  Seated PT manually resisted triceps extension isometrics, elbow flexed to 90 degrees 10x2 with 5 seconds  Seated manually resisted R shoulder IR isometrics, elbow bent at 90 degrees flexion 10x3 with 5 second holds   Slight decrease in R posterior shoulder pain with flexino afterwards  Possible posterior internal impingement  Improved exercise technique, movement at target joints, use of target muscles after mod verbal, visual, tactile cues.     Manual therapy  Seated STM R upper trap muscle to decrease tension and promote better glenohumeral mechanics             Supine STM R teres major to decrease muscle tension and fascial restrictions.   Seated posterior and inferior glide R humeral head grade 3-       Response to treatment No R arm ache after session    Clinical impression Continued working on improving scapular and rotator cuff muscle strength to promote ability to raise her R arm up more comfortably. No R  lateral arm ache after treatment. Still demonstrates R posterior shoulder joint discomfort with shoulder flexion towards end range which slightly decreased with IR strengthening. Pt tolerated session well without aggravatipn of symptoms. Pt will benefit from continued skilled physical therapy services to decrease pain, improve strength, and function.      PT Short Term Goals - 02/28/20 1803      PT SHORT TERM GOAL #1   Title Patient will be independent with her initial HEP to decrease pain, improve strenght and ability to reach with less difficulty.    Baseline Pt has started her HEP (  02/28/2020)    Time 3    Period Weeks    Status New    Target Date 03/20/20             PT Long Term Goals - 02/28/20 1804      PT LONG TERM GOAL #1   Title Patient will have a decrease in R shoulder pain to 4/10 or less at most to promote ability to reach, as well look around more comfortably.    Baseline 8/10 R shoulder pain at most for the past 3 months (02/28/2020)    Time 6    Period Weeks    Status New    Target Date 04/10/20      PT LONG TERM GOAL #2   Title Patient will improve her R shoulder ER strength by at least 1/2 MMT grade to promote ability to raise her arm up with less pain.    Baseline 4/5 R shoulder ER (02/28/2020)    Time 6    Period Weeks    Status New    Target Date 04/10/20      PT LONG TERM GOAL #3   Title Patient will improve her FOTO score by at least 10 points as a demonstration of improved function.    Time 6    Period Weeks    Status New    Target Date 04/10/20                 Plan - 03/06/20 1134    Clinical Impression Statement Continued working on improving scapular and rotator cuff muscle strength to promote ability to raise her R arm up more comfortably. No R lateral arm ache after treatment. Still demonstrates R posterior shoulder joint discomfort with shoulder flexion towards end range which slightly decreased with IR strengthening. Pt tolerated  session well without aggravatipn of symptoms. Pt will benefit from continued skilled physical therapy services to decrease pain, improve strength, and function.    Personal Factors and Comorbidities Age;Comorbidity 3+;Past/Current Experience;Time since onset of injury/illness/exacerbation    Comorbidities Depression, HTN, hx of CA    Examination-Activity Limitations Bathing;Dressing;Sleep;Reach Overhead    Stability/Clinical Decision Making Stable/Uncomplicated    Rehab Potential Fair    PT Frequency 2x / week    PT Duration 6 weeks    PT Treatment/Interventions Therapeutic activities;Therapeutic exercise;Neuromuscular re-education;Patient/family education;Manual techniques;Dry needling;Electrical Stimulation;Iontophoresis 4mg /ml Dexamethasone    PT Next Visit Plan scapular, rotator cuff strengthening, thoracic extension, manual techniques, modalities PRN    PT Home Exercise Plan Medbridge Access Code [email or web address]    Consulted and Agree with Plan of Care Patient           Patient will benefit from skilled therapeutic intervention in order to improve the following deficits and impairments:  Pain, Postural dysfunction, Impaired UE functional use, Improper body mechanics, Decreased strength, Decreased range of motion  Visit Diagnosis: Chronic right shoulder pain  Cervicalgia     Problem List Patient Active Problem List   Diagnosis Date Noted  . Positive colorectal cancer screening using Cologuard test 02/05/2020  . Esophageal dysphagia 12/11/2019  . Hyperlipidemia LDL goal <100 12/10/2019  . Right shoulder pain 10/17/2019  . Arthritis of right acromioclavicular joint 08/15/2019  . Piriformis syndrome of left side 08/15/2019  . OSA (obstructive sleep apnea) 05/13/2019  . CKD (chronic kidney disease) stage 3, GFR 30-59 ml/min (HCC) 04/13/2018  . Closed fracture of distal lateral malleolus of left ankle 12/28/2017  . Fitting and adjustment of gastrointestinal appliance and device   .  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 Axillary mass, right 08/06/2017  . S/P laparoscopic cholecystectomy 07/19/2017  . Hospital discharge follow-up 07/19/2017  . Postoperative bile leak   . Acoustic neuroma ([place]) 06/12/2017  . Colon cancer screening 03/08/2017  . Nocturia 12/09/2016  . Major depressive disorder, 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[place]) 06/05/2016  . Encounter for preventive health examination 03/07/2015  . Incisional hernia, without obstruction or gangrene 09/28/2014  . Obesity 09/28/2014  . Anxiety state 11/19/2013  . Gastro-esophageal reflux disease without esophagitis 11/19/2013  . Squamous cell carcinoma of skin of face 10/21/2011  . 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  . Melanoma ([place]) 11/02/2010  . Hyperlipidemia LDL goal <130 04/10/2010  . Essential hypertension 04/10/2010  . Osteopenia 04/10/2010    Joneen Boers PT, DPT   03/06/2020, 11:37 AM  [place] [address], [place], [address] Phone: [phone]   Fax:  [phone]  Name: VERSA CRATON MRN: [id number] Date of Birth: [birth year]

## 2020-03-11 ENCOUNTER — Other Ambulatory Visit: Payer: Self-pay | Admitting: Internal Medicine

## 2020-03-11 ENCOUNTER — Ambulatory Visit: Payer: Medicare PPO

## 2020-03-12 DIAGNOSIS — R195 Other fecal abnormalities: Secondary | ICD-10-CM | POA: Diagnosis not present

## 2020-03-13 ENCOUNTER — Ambulatory Visit: Payer: Medicare PPO

## 2020-03-13 ENCOUNTER — Other Ambulatory Visit: Payer: Self-pay

## 2020-03-13 DIAGNOSIS — M25511 Pain in right shoulder: Secondary | ICD-10-CM

## 2020-03-13 DIAGNOSIS — G8929 Other chronic pain: Secondary | ICD-10-CM | POA: Diagnosis not present

## 2020-03-13 DIAGNOSIS — M542 Cervicalgia: Secondary | ICD-10-CM | POA: Diagnosis not present

## 2020-03-13 NOTE — Patient Instructions (Signed)
Access Code: [email or web address] URL: [email or web address] Date: 03/13/2020 Prepared by: Joneen Boers  Exercises Seated Scapular Retraction - 5 x daily - 7 x weekly - 3 sets - 10 reps - 5 seconds hold Seated Cervical Retraction - 5 x daily - 7 x weekly - 3 sets - 10 reps - 5 seconds hold Standing Shoulder External Rotation with Resistance - 1 x daily - 7 x weekly - 2 sets - 10 reps Scapular Retraction with Resistance - 1 x daily - 7 x weekly - 3 sets - 10 reps - 5 seconds hold Standing Shoulder Posterior Capsule Stretch - 3 x daily - 7 x weekly - 1 sets - 3 reps - 30 seconds hold

## 2020-03-13 NOTE — Therapy (Signed)
[name] [address], [place], [address] Phone: [phone]   Fax:  [phone]  Physical Therapy Treatment  Patient Details  Name: Kathy Long MRN: [id number] Date of Birth: 02-17-1951 Referring Provider (PT): [name], MD   Encounter Date: 03/13/2020   PT End of Session - 03/13/20 1546    Visit Number 4    Number of Visits 13    Date for PT Re-Evaluation 04/10/20    Authorization Type 4    Authorization Time Period of 10 progress report    PT Start Time 3220    PT Stop Time 1630    PT Time Calculation (min) 44 min    Activity Tolerance Patient tolerated treatment well    Behavior During Therapy [facility] for tasks assessed/performed           Past Medical History:  Diagnosis Date  . Acoustic neuroma (HCC)    left ear  . Arthritis    lower back, hands  . Cancer (HCC)    melanoma, shoulder  . Depression   . GERD (gastroesophageal reflux disease)   . Hyperlipidemia   . Hyperlipidemia associated with type 2 diabetes mellitus ([facility]) 12/10/2019  . Hypertension   . 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     Past Surgical History:  Procedure Laterality Date  . APPENDECTOMY  2010  . BACK SURGERY     disectomy lumbar, scar tissue  . BREAST CYST EXCISION Right 1970   [place]  . CATARACT EXTRACTION W/PHACO Left 04/12/2018   Procedure: CATARACT EXTRACTION PHACO AND INTRAOCULAR LENS PLACEMENT ([place])  LEFT TORIC LENS;  Surgeon: Leandrew Koyanagi, MD;  Location: [facility];  Service: Ophthalmology;  Laterality: Left;  . CATARACT EXTRACTION W/PHACO Right 05/10/2018   Procedure: CATARACT EXTRACTION PHACO AND INTRAOCULAR LENS PLACEMENT ([place])  RIGHT TORIC LENS;  Surgeon: Leandrew Koyanagi, MD;  Location: [facility];  Service: Ophthalmology;  Laterality: Right;  DIABETIC  . CHOLECYSTECTOMY N/A 07/05/2017   Procedure: LAPAROSCOPIC CHOLECYSTECTOMY WITH INTRAOPERATIVE CHOLANGIOGRAM;  Surgeon:  Robert Bellow, MD;  Location: [facility];  Service: General;  Laterality: N/A;  . COLONOSCOPY     2009 and color gaurd in 2018  . ERCP N/A 07/12/2017   Procedure: ENDOSCOPIC RETROGRADE CHOLANGIOPANCREATOGRAPHY (ERCP);  Surgeon: Lucilla Lame, MD;  Location: [facility]OSCOPY;  Service: Endoscopy;  Laterality: N/A;  . ERCP N/A 10/04/2017   Procedure: ENDOSCOPIC RETROGRADE CHOLANGIOPANCREATOGRAPHY (ERCP);  Surgeon: Lucilla Lame, MD;  Location: [facility] ENDOSCOPY;  Service: Endoscopy;  Laterality: N/A;  . FOOT SURGERY     x 2  . MELANOMA EXCISION Left 2000  . PALATOPLASTY N/A 04/08/2015   Procedure: PALATOPLASTY;  Surgeon: Beverly Gust, MD;  Location: [facility];  Service: ENT;  Laterality: N/A;  . TONSILLECTOMY N/A 04/08/2015   Procedure: TONSILLECTOMY;  Surgeon: Beverly Gust, MD;  Location: [facility];  Service: ENT;  Laterality: N/A;  . TUBAL LIGATION      There were no vitals filed for this visit.   Subjective Assessment - 03/13/20 1548    Subjective R shoulder is pretty good. 2/10 at end range shoulder flexion. R upper trap is still good. Has not felt it.    Pertinent History R shoulder pain. Gradual onset, 6 months ago. Dr. Derrel Nip is her PCP. Pt states that she has pain in R shoulder and L hip. Was referred to [name]. R shoulder was her main problem at the time. Pt has a hx of 2 back  surgeries and has pain down her leg which comes and goes. Was given an injection R shoulder which helped. Was also given gabapentin which did not help. Pain return 4 weeks after the shot. Pt is L hand dominant. 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    Patient Stated Goals Live without the shoulder pain.    Currently in Pain? Yes    Pain Score 2     Pain Orientation Right    Pain Onset More than a month ago              [facility] PT Assessment - 03/13/20 1753       AROM   Right Shoulder Flexion 163 Degrees   no pain after session                                PT Education - 03/13/20 1610    Education Details ther-ex, HEP    Person(s) Educated Patient    Methods Explanation;Demonstration;Tactile cues;Verbal cues;Handout    Comprehension Returned demonstration;Verbalized understanding          Objective  No latex allergies  TTP R supraspinatus, distal scalene Tense R upper trap muscle No neck surgery hx (-) Empty can R    Per pt, she has a lipoma inferior angle of L scapula  MedbridgeAccess Code [email or web address]    Therapeutic exercise  Standing R cross arm stretch 30 seconds x 3    Decreased R shoulder joint pain but demonstrates R lateral deltoid symptoms  Standing B scapular retraction red band 10x5 seconds. B gastroc discomfort  Then sitting 10x5 seconds for 2 sets    No B gastroc discomfort in sitting   No shoulder pain afterwards  Standing B shoulder ER yellow band 10x  Seated chin tucks with B scapular retraction 10x3 with 5 second hold s  Seated manually resisted scapular retraction isometrics targeting the lower trap muscle R 10x3 with 5 second holds              L 10x3 with 5 second holds     Improved exercise technique, movement at target joints, use of target muscles after mod verbal, visual, tactile cues.    Manual therapy  Seated STM R upper trap muscle to decrease tension and promote better glenohumeral mechanics      Response to treatment 163 degrees R shoulder flexion and no pain after session    Clinical impression Decreased posterior internal impingement with posterior shoulder capsule stretch. Decreased R anterior shoulder impingement with treatment to promote scapular retraction and infraspinatus muscle use. No R shoulder pain after session with shoulder flexion. Pt will benefit from continued skilled physical therapy services  to decrease pain, improve strength and function.       PT Short Term Goals - 02/28/20 1803      PT SHORT TERM GOAL #1   Title Patient will be independent with her initial HEP to decrease pain, improve strenght and ability to reach with less difficulty.    Baseline Pt has started her HEP (02/28/2020)    Time 3    Period Weeks    Status New    Target Date 03/20/20             PT Long Term Goals - 02/28/20 1804  PT LONG TERM GOAL #1   Title Patient will have a decrease in R shoulder pain to 4/10 or less at most to promote ability to reach, as well look around more comfortably.    Baseline 8/10 R shoulder pain at most for the past 3 months (02/28/2020)    Time 6    Period Weeks    Status New    Target Date 04/10/20      PT LONG TERM GOAL #2   Title Patient will improve her R shoulder ER strength by at least 1/2 MMT grade to promote ability to raise her arm up with less pain.    Baseline 4/5 R shoulder ER (02/28/2020)    Time 6    Period Weeks    Status New    Target Date 04/10/20      PT LONG TERM GOAL #3   Title Patient will improve her FOTO score by at least 10 points as a demonstration of improved function.    Time 6    Period Weeks    Status New    Target Date 04/10/20                 Plan - 03/13/20 1749    Clinical Impression Statement Decreased posterior internal impingement with posterior shoulder capsule stretch. Decreased R anterior shoulder impingement with treatment to promote scapular retraction and infraspinatus muscle use. No R shoulder pain after session with shoulder flexion. Pt will benefit from continued skilled physical therapy services to decrease pain, improve strength and function.    Personal Factors and Comorbidities Age;Comorbidity 3+;Past/Current Experience;Time since onset of injury/illness/exacerbation    Comorbidities Depression, HTN, hx of CA    Examination-Activity Limitations Bathing;Dressing;Sleep;Reach Overhead     Stability/Clinical Decision Making Stable/Uncomplicated    Rehab Potential Fair    PT Frequency 2x / week    PT Duration 6 weeks    PT Treatment/Interventions Therapeutic activities;Therapeutic exercise;Neuromuscular re-education;Patient/family education;Manual techniques;Dry needling;Electrical Stimulation;Iontophoresis 4mg /ml Dexamethasone    PT Next Visit Plan scapular, rotator cuff strengthening, thoracic extension, manual techniques, modalities PRN    PT Home Exercise Plan Medbridge Access Code [email or web address]    Consulted and Agree with Plan of Care Patient           Patient will benefit from skilled therapeutic intervention in order to improve the following deficits and impairments:  Pain, Postural dysfunction, Impaired UE functional use, Improper body mechanics, Decreased strength, Decreased range of motion  Visit Diagnosis: Cervicalgia  Chronic right shoulder pain     Problem List Patient Active Problem List   Diagnosis Date Noted  . Positive colorectal cancer screening using Cologuard test 02/05/2020  . Esophageal dysphagia 12/11/2019  . Hyperlipidemia LDL goal <100 12/10/2019  . Right shoulder pain 10/17/2019  . Arthritis of right acromioclavicular joint 08/15/2019  . Piriformis syndrome of left side 08/15/2019  . OSA (obstructive sleep apnea) 05/13/2019  . CKD (chronic kidney disease) stage 3, GFR 30-59 ml/min (HCC) 04/13/2018  . Closed fracture of distal lateral malleolus of left ankle 12/28/2017  . Fitting and adjustment of gastrointestinal appliance and device   . 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 Axillary mass, right 08/06/2017  . S/P laparoscopic cholecystectomy 07/19/2017  . Hospital discharge follow-up 07/19/2017  . Postoperative bile leak   . Acoustic neuroma ([place]) 06/12/2017  . Colon cancer screening 03/08/2017  . Nocturia 12/09/2016  . Major depressive disorder, 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[place]) 06/05/2016  . Encounter for preventive health examination  03/07/2015  . Incisional hernia, without  obstruction or gangrene 09/28/2014  . Obesity 09/28/2014  . Anxiety state 11/19/2013  . Gastro-esophageal reflux disease without esophagitis 11/19/2013  . Squamous cell carcinoma of skin of face 10/21/2011  . 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  . Melanoma ([place]) 11/02/2010  . Hyperlipidemia LDL goal <130 04/10/2010  . Essential hypertension 04/10/2010  . Osteopenia 04/10/2010    Joneen Boers PT, DPT   03/13/2020, 5:55 PM  [facility] [address], [place], [address] Phone: [phone]   Fax:  [phone]  Name: ASJA FROMMER MRN: [id number] Date of Birth: Nov 21, 1950

## 2020-03-17 ENCOUNTER — Ambulatory Visit: Payer: Medicare PPO

## 2020-03-20 DIAGNOSIS — D0359 Melanoma in situ of other part of trunk: Secondary | ICD-10-CM | POA: Diagnosis not present

## 2020-03-21 ENCOUNTER — Other Ambulatory Visit: Payer: Self-pay

## 2020-03-21 ENCOUNTER — Other Ambulatory Visit
Admission: RE | Admit: 2020-03-21 | Discharge: 2020-03-21 | Disposition: A | Discharge status: Home / Self Care | Payer: Medicare PPO | Source: Ambulatory Visit | Attending: Gastroenterology | Admitting: Gastroenterology

## 2020-03-21 DIAGNOSIS — Z01812 Encounter for preprocedural laboratory examination: Secondary | ICD-10-CM | POA: Diagnosis not present

## 2020-03-21 DIAGNOSIS — Z20822 Contact with and (suspected) exposure to covid-19: Secondary | ICD-10-CM | POA: Insufficient documentation

## 2020-03-22 LAB — SARS CORONAVIRUS 2 (TAT 6-24 HRS): SARS Coronavirus 2: NEGATIVE

## 2020-03-24 ENCOUNTER — Ambulatory Visit: Payer: Medicare PPO

## 2020-03-24 ENCOUNTER — Encounter: Payer: Self-pay | Admitting: *Deleted

## 2020-03-25 ENCOUNTER — Ambulatory Visit: Payer: Medicare PPO | Admitting: Anesthesiology

## 2020-03-25 ENCOUNTER — Encounter
Admission: RE | Disposition: A | Discharge status: Home / Self Care | Payer: Self-pay | Source: Home / Self Care | Attending: Gastroenterology

## 2020-03-25 ENCOUNTER — Encounter: Payer: Self-pay | Admitting: *Deleted

## 2020-03-25 ENCOUNTER — Ambulatory Visit
Admission: RE | Admit: 2020-03-25 | Discharge: 2020-03-25 | Disposition: A | Discharge status: Home / Self Care | Payer: Medicare PPO | Attending: Gastroenterology | Admitting: Gastroenterology

## 2020-03-25 DIAGNOSIS — K648 Other hemorrhoids: Secondary | ICD-10-CM | POA: Diagnosis not present

## 2020-03-25 DIAGNOSIS — R195 Other fecal abnormalities: Secondary | ICD-10-CM | POA: Diagnosis not present

## 2020-03-25 DIAGNOSIS — K5730 Diverticulosis of large intestine without perforation or abscess without bleeding: Secondary | ICD-10-CM | POA: Diagnosis not present

## 2020-03-25 DIAGNOSIS — Z885 Allergy status to narcotic agent status: Secondary | ICD-10-CM | POA: Diagnosis not present

## 2020-03-25 DIAGNOSIS — K640 First degree hemorrhoids: Secondary | ICD-10-CM | POA: Diagnosis not present

## 2020-03-25 DIAGNOSIS — Z800 Family history of malignant neoplasm of digestive organs: Secondary | ICD-10-CM | POA: Diagnosis not present

## 2020-03-25 DIAGNOSIS — K5790 Diverticulosis of intestine, part unspecified, without perforation or abscess without bleeding: Secondary | ICD-10-CM | POA: Diagnosis not present

## 2020-03-25 HISTORY — DX: Chronic kidney disease, unspecified: N18.9

## 2020-03-25 HISTORY — PX: COLONOSCOPY: SHX5424

## 2020-03-25 HISTORY — DX: Sleep apnea, unspecified: G47.30

## 2020-03-25 HISTORY — DX: Anxiety disorder, unspecified: F41.9

## 2020-03-25 SURGERY — COLONOSCOPY
Anesthesia: Monitor Anesthesia Care

## 2020-03-25 MED ORDER — PROPOFOL 500 MG/50ML IV EMUL
INTRAVENOUS | Status: AC
  Filled 2020-03-25: qty 50

## 2020-03-25 MED ORDER — GLYCOPYRROLATE 0.2 MG/ML IJ SOLN
INTRAMUSCULAR | Status: DC | PRN
  Administered 2020-03-25 (×2): .2 mg via INTRAVENOUS

## 2020-03-25 MED ORDER — PROPOFOL 10 MG/ML IV BOLUS
INTRAVENOUS | Status: DC | PRN
  Administered 2020-03-25 (×2): 50 mg via INTRAVENOUS

## 2020-03-25 MED ORDER — SODIUM CHLORIDE 0.9 % IV SOLN: INTRAVENOUS | Status: DC

## 2020-03-25 MED ORDER — PROPOFOL 500 MG/50ML IV EMUL
INTRAVENOUS | Status: DC | PRN
  Administered 2020-03-25: 125 ug/kg/min via INTRAVENOUS

## 2020-03-25 NOTE — Anesthesia Preprocedure Evaluation (Signed)
Anesthesia Evaluation  Patient identified by MRN, date of birth, ID band Patient awake    Reviewed: Allergy & Precautions, NPO status , Patient's Chart, lab work & pertinent test results  History of Anesthesia Complications Negative for: history of anesthetic complications  Airway Mallampati: I  TM Distance: >3 FB Neck ROM: Full    Dental no notable dental hx.    Pulmonary sleep apnea ,    Pulmonary exam normal        Cardiovascular hypertension, Normal cardiovascular exam Rhythm:Regular Rate:Normal     Neuro/Psych PSYCHIATRIC DISORDERS Anxiety Depression Acoustic neuroma (small, stable, monitored via imaging yearly)  Neuromuscular disease    GI/Hepatic Neg liver ROS, GERD  Medicated and Controlled,  Endo/Other  negative endocrine ROS  Renal/GU Renal InsufficiencyRenal disease  negative genitourinary   Musculoskeletal  (+) Arthritis ,   Abdominal   Peds negative pediatric ROS (+)  Hematology negative hematology ROS (+)   Anesthesia Other Findings   Reproductive/Obstetrics                             Anesthesia Physical  Anesthesia Plan  ASA: II  Anesthesia Plan: MAC   Post-op Pain Management:    Induction: Intravenous  PONV Risk Score and Plan: 2 and TIVA and Midazolam  Airway Management Planned: Natural Airway  Additional Equipment:   Intra-op Plan:   Post-operative Plan:   Informed Consent: I have reviewed the patients History and Physical, chart, labs and discussed the procedure including the risks, benefits and alternatives for the proposed anesthesia with the patient or authorized representative who has indicated his/her understanding and acceptance.       Plan Discussed with: CRNA  Anesthesia Plan Comments:         Anesthesia Quick Evaluation

## 2020-03-25 NOTE — Transfer of Care (Signed)
Immediate Anesthesia Transfer of Care Note  Patient: Kathy Long  Procedure(s) Performed: COLONOSCOPY (N/A )  Patient Location: PACU and Endoscopy Unit  Anesthesia Type:General  Level of Consciousness: awake, alert  and oriented  Airway & Oxygen Therapy: Patient Spontanous Breathing  Post-op Assessment: Report given to RN and Post -op Vital signs reviewed and stable  Post vital signs: Reviewed and stable  Last Vitals:  Vitals Value Taken Time  BP    Temp    Pulse 76 03/25/20 1157  Resp 14 03/25/20 1157  SpO2 95 % 03/25/20 1157  Vitals shown include unvalidated device data.  Last Pain:  Vitals:   03/25/20 1046  TempSrc: Tympanic  PainSc: 0-No pain         Complications: No complications documented.

## 2020-03-25 NOTE — Op Note (Signed)
[facility] Gastroenterology Patient Name: Kathy Long Procedure Date: 03/25/2020 11:13 AM MRN: [id number] Account #: [id number] Date of Birth: 10/31/1950 Admit Type: Outpatient Age: 69 Room: [facility] ENDO ROOM 1 Gender: Female Note Status: Finalized Procedure:             Colonoscopy Indications:           Positive Cologuard test Providers:             Andrey Farmer MD, MD Referring MD:          Deborra Medina, MD (Referring MD) Medicines:             Monitored Anesthesia Care Complications:         No immediate complications. Procedure:             Pre-Anesthesia Assessment:                        - Prior to the procedure, a History and Physical was                         performed, and patient medications and allergies were                         reviewed. The patient is competent. The risks and                         benefits of the procedure and the sedation options and                         risks were discussed with the patient. All questions                         were answered and informed consent was obtained.                         Patient identification and proposed procedure were                         verified by the physician, the nurse, the anesthetist                         and the technician in the endoscopy suite. Mental                         Status Examination: alert and oriented. Airway                         Examination: normal oropharyngeal airway and neck                         mobility. Respiratory Examination: clear to                         auscultation. CV Examination: normal. Prophylactic                         Antibiotics: The patient does not require prophylactic  antibiotics. Prior Anticoagulants: The patient has                         taken no previous anticoagulant or antiplatelet                         agents. ASA Grade Assessment: II - A patient with mild                         systemic  disease. After reviewing the risks and                         benefits, the patient was deemed in satisfactory                         condition to undergo the procedure. The anesthesia                         plan was to use monitored anesthesia care (MAC).                         Immediately prior to administration of medications,                         the patient was re-assessed for adequacy to receive                         sedatives. The heart rate, respiratory rate, oxygen                         saturations, blood pressure, adequacy of pulmonary                         ventilation, and response to care were monitored                         throughout the procedure. The physical status of the                         patient was re-assessed after the procedure.                        After obtaining informed consent, the colonoscope was                         passed under direct vision. Throughout the procedure,                         the patient's blood pressure, pulse, and oxygen                         saturations were monitored continuously. The                         Colonoscope was introduced through the anus and                         advanced to the the cecum, identified by appendiceal  orifice and ileocecal valve. The colonoscopy was                         technically difficult and complex due to restricted                         mobility of the colon and a redundant colon.                         Successful completion of the procedure was aided by                         applying abdominal pressure. The patient tolerated the                         procedure well. The quality of the bowel preparation                         was good. Findings:      The perianal and digital rectal examinations were normal.      Many small-mouthed diverticula were found in the sigmoid colon,       descending colon and transverse colon.      Non-bleeding  internal hemorrhoids were found during retroflexion. The       hemorrhoids were Grade I (internal hemorrhoids that do not prolapse).      The exam was otherwise without abnormality on direct and retroflexion       views. Impression:            - Diverticulosis in the sigmoid colon, in the                         descending colon and in the transverse colon.                        - Non-bleeding internal hemorrhoids.                        - The examination was otherwise normal on direct and                         retroflexion views.                        - No specimens collected. Recommendation:        - Discharge patient to home.                        - Resume previous diet.                        - Continue present medications.                        - Repeat colonoscopy in 10 years for screening                         purposes.                        - Return to referring physician as previously  scheduled. Procedure Code(s):     --- Professional ---                        [phone], Colonoscopy, flexible; diagnostic, including                         collection of specimen(s) by brushing or washing, when                         performed (separate procedure) Diagnosis Code(s):     --- Professional ---                        K64.0, First degree hemorrhoids                        R19.5, Other fecal abnormalities                        K57.30, Diverticulosis of large intestine without                         perforation or abscess without bleeding CPT copyright 2019 American Medical Association. All rights reserved. The codes documented in this report are preliminary and upon coder review may  be revised to meet current compliance requirements. Andrey Farmer, MD Andrey Farmer MD, MD 03/25/2020 11:55:20 AM Number of Addenda: 0 Note Initiated On: 03/25/2020 11:13 AM Scope Withdrawal Time: 0 hours 7 minutes 31 seconds  Total Procedure Duration: 0 hours 28  minutes 4 seconds  Estimated Blood Loss:  Estimated blood loss: none.      [facility]

## 2020-03-25 NOTE — Interval H&P Note (Signed)
History and Physical Interval Note:  03/25/2020 11:19 AM  Kathy Long  has presented today for surgery, with the diagnosis of + COLOGUARD.  The various methods of treatment have been discussed with the patient and family. After consideration of risks, benefits and other options for treatment, the patient has consented to  Procedure(s): COLONOSCOPY (N/A) as a surgical intervention.  The patient's history has been reviewed, patient examined, no change in status, stable for surgery.  I have reviewed the patient's chart and labs.  Questions were answered to the patient's satisfaction.     [name]  Ok to proceed with colonoscopy

## 2020-03-25 NOTE — H&P (Signed)
Outpatient short stay form Pre-procedure 03/25/2020 11:16 AM Kathy Miyamoto MD, MPH  Primary Physician: Dr. Derrel Nip  Reason for visit:  FIT positive  History of present illness:   69 y/o lady with history of FIT test being positive. Had colonoscopy many years ago but no records. Paternal aunt with history of colon cancer. No abdominal surgeries and no blood thinners.    Current Facility-Administered Medications:  .  0.9 %  sodium chloride infusion, , Intravenous, Continuous, Kathy Long, Kathy Cork, MD, Last Rate: 20 mL/hr at 03/25/20 1101, New Bag at 03/25/20 1101  Medications Prior to Admission  Medication Sig Dispense Refill Last Dose  . Coenzyme Q10 100 MG capsule Take 100 mg by mouth daily.     . metoprolol succinate (TOPROL-XL) 25 MG 24 hr tablet TAKE 1 TABLET BY MOUTH WITH OR IMMEDIATLEY FOLLOWING A MEAL EVERY DAY 90 tablet 1 03/24/2020 at Unknown time  . acetaminophen (TYLENOL) 325 MG tablet Take 650 mg by mouth every 6 (six) hours as needed.      Marland Kitchen amLODipine (NORVASC) 5 MG tablet TAKE ONE TABLET BY MOUTH EVERY DAY 90 tablet 1   . Cholecalciferol 1000 UNITS tablet Take 1,000 Units by mouth daily. Pt is taking 6,000 IUS daily     . citalopram (CELEXA) 20 MG tablet TAKE ONE TABLET BY MOUTH EVERY DAY 90 tablet 1   . clobetasol (TEMOVATE) 0.05 % external solution APPLY DAILY UNTIL RASH IS CLEAR 50 mL 3   . gabapentin (NEURONTIN) 100 MG capsule Take 2 capsules (200 mg total) by mouth at bedtime. (Patient not taking: Reported on 02/28/2020) 180 capsule 0   . hydrochlorothiazide (HYDRODIURIL) 25 MG tablet TAKE ONE TABLET EVERY DAY 90 tablet 3   . Melatonin 3 MG CAPS Take 3 mg by mouth at bedtime.     Marland Kitchen omeprazole (PRILOSEC) 40 MG capsule TAKE 1 CAPSULE BY MOUTH ONCE DAILY 90 capsule 1   . ondansetron (ZOFRAN ODT) 4 MG disintegrating tablet Take 1 tablet (4 mg total) by mouth every 8 (eight) hours as needed for nausea or vomiting. (Patient not taking: Reported on 02/28/2020) 20 tablet 0   .  rosuvastatin (CRESTOR) 10 MG tablet TAKE ONE TABLET BY MOUTH EVERY DAY 90 tablet 1      Allergies  Allergen Reactions  . Morphine And Related Other (See Comments)    Chest pain  . Tramadol Other (See Comments)    tremors  . Tape Rash          Past Medical History:  Diagnosis Date  . Acoustic neuroma (HCC)    left ear  . Anxiety   . Arthritis    lower back, hands  . Cancer (HCC)    melanoma, shoulder  . Chronic kidney disease   . Depression   . GERD (gastroesophageal reflux disease)   . Hyperlipidemia   . Hypertension   . Sleep apnea   . 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    Physical Exam  Gen: Alert, oriented. Appears stated age.  HEENT: PERRLA. Lungs: No respiratory distress CV: RRR Abd: soft, benign, no masses. BS+ Ext: No edema. Pulses 2+    Planned procedures: Proceed with colonoscopy. The patient understands the nature of the planned procedure, indications, risks, alternatives and potential complications including but not limited to bleeding, infection, perforation, damage to internal organs and possible oversedation/side effects from anesthesia. The patient agrees and gives consent to proceed.  Please refer to  procedure notes for findings, recommendations and patient disposition/instructions.     Kathy Miyamoto MD, MPH Gastroenterology 03/25/2020  11:16 AM

## 2020-03-26 ENCOUNTER — Encounter: Payer: Self-pay | Admitting: Gastroenterology

## 2020-03-26 NOTE — Anesthesia Postprocedure Evaluation (Signed)
Anesthesia Post Note  Patient: Kathy Long  Procedure(s) Performed: COLONOSCOPY (N/A )  Patient location during evaluation: Endoscopy Anesthesia Type: General Level of consciousness: awake and alert and oriented Pain management: pain level controlled Vital Signs Assessment: post-procedure vital signs reviewed and stable Respiratory status: spontaneous breathing Cardiovascular status: blood pressure returned to baseline Anesthetic complications: no   No complications documented.   Last Vitals:  Vitals:   03/25/20 1207 03/25/20 1217  BP: (!) 146/80 (!) 144/69  Pulse: 71 67  Resp: 17 15  Temp:    SpO2: 98% 98%    Last Pain:  Vitals:   03/25/20 1217  TempSrc:   PainSc: 0-No pain                 [name]

## 2020-03-27 ENCOUNTER — Ambulatory Visit: Payer: Medicare PPO

## 2020-04-01 ENCOUNTER — Ambulatory Visit: Payer: Medicare PPO

## 2020-04-03 ENCOUNTER — Ambulatory Visit: Payer: Medicare PPO | Attending: Family Medicine

## 2020-04-03 ENCOUNTER — Other Ambulatory Visit: Payer: Self-pay

## 2020-04-03 DIAGNOSIS — G8929 Other chronic pain: Secondary | ICD-10-CM | POA: Diagnosis not present

## 2020-04-03 DIAGNOSIS — M25511 Pain in right shoulder: Secondary | ICD-10-CM | POA: Insufficient documentation

## 2020-04-03 DIAGNOSIS — M542 Cervicalgia: Secondary | ICD-10-CM | POA: Insufficient documentation

## 2020-04-03 NOTE — Therapy (Signed)
[place] [facility] [address], [place], [address] Phone: [phone]   Fax:  [phone]  Physical Therapy Treatment  Patient Details  Name: Kathy Long MRN: [id number] Date of Birth: Aug 02, 1950 Referring Provider (PT): [name], MD   Encounter Date: 04/03/2020   PT End of Session - 04/03/20 1021    Visit Number 5    Number of Visits 13    Date for PT Re-Evaluation 04/10/20    Authorization Type 5    Authorization Time Period of 10 progress report    PT Start Time 1021    PT Stop Time 1101    PT Time Calculation (min) 40 min    Activity Tolerance Patient tolerated treatment well    Behavior During Therapy [facility] for tasks assessed/performed           Past Medical History:  Diagnosis Date  . Acoustic neuroma (HCC)    left ear  . Anxiety   . Arthritis    lower back, hands  . Cancer (HCC)    melanoma, shoulder  . Chronic kidney disease   . Depression   . GERD (gastroesophageal reflux disease)   . Hyperlipidemia   . Hypertension   . Sleep apnea   . 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     Past Surgical History:  Procedure Laterality Date  . APPENDECTOMY  2010  . BACK SURGERY     disectomy lumbar, scar tissue  . BREAST CYST EXCISION Right 1970   axilla  . CATARACT EXTRACTION W/PHACO Left 04/12/2018   Procedure: CATARACT EXTRACTION PHACO AND INTRAOCULAR LENS PLACEMENT ([place])  LEFT TORIC LENS;  Surgeon: Leandrew Koyanagi, MD;  Location: [place];  Service: Ophthalmology;  Laterality: Left;  . CATARACT EXTRACTION W/PHACO Right 05/10/2018   Procedure: CATARACT EXTRACTION PHACO AND INTRAOCULAR LENS PLACEMENT ([place])  RIGHT TORIC LENS;  Surgeon: Leandrew Koyanagi, MD;  Location: [place];  Service: Ophthalmology;  Laterality: Right;  DIABETIC  . CHOLECYSTECTOMY N/A 07/05/2017   Procedure: LAPAROSCOPIC CHOLECYSTECTOMY WITH INTRAOPERATIVE CHOLANGIOGRAM;  Surgeon: Robert Bellow, MD;   Location: [facility];  Service: General;  Laterality: N/A;  . COLONOSCOPY     2009 and color gaurd in 2018  . COLONOSCOPY N/A 03/25/2020   Procedure: COLONOSCOPY;  Surgeon: [name], MD;  Location: [facility] ENDOSCOPY;  Service: Endoscopy;  Laterality: N/A;  . ERCP N/A 07/12/2017   Procedure: ENDOSCOPIC RETROGRADE CHOLANGIOPANCREATOGRAPHY (ERCP);  Surgeon: Lucilla Lame, MD;  Location: [facility] ENDOSCOPY;  Service: Endoscopy;  Laterality: N/A;  . ERCP N/A 10/04/2017   Procedure: ENDOSCOPIC RETROGRADE CHOLANGIOPANCREATOGRAPHY (ERCP);  Surgeon: Lucilla Lame, MD;  Location: [facility] ENDOSCOPY;  Service: Endoscopy;  Laterality: N/A;  . FOOT SURGERY     x 2  . MELANOMA EXCISION Left 2000  . PALATOPLASTY N/A 04/08/2015   Procedure: PALATOPLASTY;  Surgeon: Beverly Gust, MD;  Location: [facility];  Service: ENT;  Laterality: N/A;  . TONSILLECTOMY N/A 04/08/2015   Procedure: TONSILLECTOMY;  Surgeon: Beverly Gust, MD;  Location: [facility];  Service: ENT;  Laterality: N/A;  . TUBAL LIGATION      There were no vitals filed for this visit.   Subjective Assessment - 04/03/20 1023    Subjective Had surgery to remove melanoma between her shoulder blades and had to stop PT for 10 days. R shoulder is better. Overall does not wince like she did.  2/10 R shoulder pain at most for the past 7 days. Has not had  the upper trap and neck pain. Still has the spot behind her R shoulder.    Pertinent History R shoulder pain. Gradual onset, 6 months ago. Dr. Derrel Nip is her PCP. Pt states that she has pain in R shoulder and L hip. Was referred to [name]. R shoulder was her main problem at the time. Pt has a hx of 2 back surgeries and has pain down her leg which comes and goes. Was given an injection R shoulder which helped. Was also given gabapentin which did not help. Pain return 4 weeks after the shot. Pt is L hand dominant. 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    Patient Stated Goals Live without the shoulder pain.    Currently in Pain? No/denies    Pain Score 0-No pain    Pain Onset More than a month ago              [facility] PT Assessment - 04/03/20 1028      Strength   Right Shoulder External Rotation 4+/5                                 PT Education - 04/03/20 1029    Education Details ther-ex    Person(s) Educated Patient    Methods Explanation;Demonstration;Tactile cues;Verbal cues    Comprehension Verbalized understanding;Returned demonstration           Objective  No latex allergies  TTP R supraspinatus, distal scalene Tense R upper trap muscle No neck surgery hx (-) Empty can R      MedbridgeAccess Code [email or web address]    Therapeutic exercise  Standing R cross arm stretch 30 seconds x 3 to stretch posterior capsule.  R first rib stretch 15 seconds 2 x 5  Seated B scapular retraction 10x3 with 5 second holds   No pain with R shoulder flexion, end range  Good AC joint movement palpated  Shoulder flexion AROM multiple times to assess effectiveness of treatment  Standing B shoulder ER yellow band 10x  Seated manually resisted R elbow extension isometrics 10x5 seconds for 3 sets   Improved exercise technique, movement at target joints, use of target muscles after mod verbal, visual, tactile cues.    Manual therapy  Seated STM R upper trap muscle to decrease tension and promote better glenohumeral mechanics      Response to treatment 167 degrees R shoulder flexion after session    Clinical impression Pt making very good progress with R shoulder pain, strength, as well as function. Decreased end range pain with flexion with treatment to improve scapular retraction/AC joint mobility, as well as posterior capsule stretch. Pt will benefit from continued skilled  physical therapy services to continue to decrease pain, improve strength and function.    PT Short Term Goals - 04/03/20 1124      PT SHORT TERM GOAL #1   Title Patient will be independent with her initial HEP to decrease pain, improve strenght and ability to reach with less difficulty.    Baseline Pt has started her HEP (02/28/2020); Pt performing her HEP independently (04/03/2020)    Time 3    Period Weeks    Status Achieved    Target Date 03/20/20  PT Long Term Goals - 04/03/20 1123      PT LONG TERM GOAL #1   Title Patient will have a decrease in R shoulder pain to 4/10 or less at most to promote ability to reach, as well look around more comfortably.    Baseline 8/10 R shoulder pain at most for the past 3 months (02/28/2020); 2/10 at most for the past 7 days (04/03/2020)    Time 6    Period Weeks    Status Achieved    Target Date 04/10/20      PT LONG TERM GOAL #2   Title Patient will improve her R shoulder ER strength by at least 1/2 MMT grade to promote ability to raise her arm up with less pain.    Baseline 4/5 R shoulder ER (02/28/2020); 4+/5 R shoulder ER (04/03/2020)    Time 6    Period Weeks    Status Achieved    Target Date 04/10/20      PT LONG TERM GOAL #3   Title Patient will improve her FOTO score by at least 10 points as a demonstration of improved function.    Baseline 68 (04/03/2020)    Time 6    Period Weeks    Status Achieved    Target Date 04/10/20                 Plan - 04/03/20 1129    Clinical Impression Statement Pt making very good progress with R shoulder pain, strength, as well as function. Decreased end range pain with flexion with treatment to improve scapular retraction/AC joint mobility, as well as posterior capsule stretch. Pt will benefit from continued skilled physical therapy services to continue to decrease pain, improve strength and function.    Personal Factors and Comorbidities Age;Comorbidity 3+;Past/Current  Experience;Time since onset of injury/illness/exacerbation    Comorbidities Depression, HTN, hx of CA    Examination-Activity Limitations Bathing;Dressing;Sleep;Reach Overhead    Stability/Clinical Decision Making Stable/Uncomplicated    Rehab Potential Fair    PT Frequency 2x / week    PT Duration 6 weeks    PT Treatment/Interventions Therapeutic activities;Therapeutic exercise;Neuromuscular re-education;Patient/family education;Manual techniques;Dry needling;Electrical Stimulation;Iontophoresis 4mg /ml Dexamethasone    PT Next Visit Plan scapular, rotator cuff strengthening, thoracic extension, manual techniques, modalities PRN    PT Home Exercise Plan Medbridge Access Code [email or web address]    Consulted and Agree with Plan of Care Patient           Patient will benefit from skilled therapeutic intervention in order to improve the following deficits and impairments:  Pain, Postural dysfunction, Impaired UE functional use, Improper body mechanics, Decreased strength, Decreased range of motion  Visit Diagnosis: Cervicalgia  Chronic right shoulder pain     Problem List Patient Active Problem List   Diagnosis Date Noted  . Positive colorectal cancer screening using Cologuard test 02/05/2020  . Esophageal dysphagia 12/11/2019  . Hyperlipidemia LDL goal <100 12/10/2019  . Right shoulder pain 10/17/2019  . Arthritis of right acromioclavicular joint 08/15/2019  . Piriformis syndrome of left side 08/15/2019  . OSA (obstructive sleep apnea) 05/13/2019  . CKD (chronic kidney disease) stage 3, GFR 30-59 ml/min (HCC) 04/13/2018  . Closed fracture of distal lateral malleolus of left ankle 12/28/2017  . Fitting and adjustment of gastrointestinal appliance and device   . 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 Axillary mass, right 08/06/2017  . S/P laparoscopic cholecystectomy 07/19/2017  . Hospital discharge follow-up 07/19/2017  . Postoperative bile leak   . Acoustic neuroma (  [place]) 06/12/2017   . Colon cancer screening 03/08/2017  . Nocturia 12/09/2016  . Major depressive disorder, 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[place]) 06/05/2016  . Encounter for preventive health examination 03/07/2015  . Incisional hernia, without obstruction or gangrene 09/28/2014  . Obesity 09/28/2014  . Anxiety state 11/19/2013  . Gastro-esophageal reflux disease without esophagitis 11/19/2013  . Squamous cell carcinoma of skin of face 10/21/2011  . 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  . Melanoma ([place]) 11/02/2010  . Hyperlipidemia LDL goal <130 04/10/2010  . Essential hypertension 04/10/2010  . Osteopenia 04/10/2010    Joneen Boers PT, DPT   04/03/2020, 11:30 AM  [place] [facility] [address] [place], [address] Phone: [phone]   Fax:  [phone]  Name: Kathy Long MRN: [id number] Date of Birth: [birth year]

## 2020-04-08 ENCOUNTER — Ambulatory Visit: Payer: Medicare PPO

## 2020-04-10 ENCOUNTER — Ambulatory Visit: Payer: Medicare PPO

## 2020-04-10 ENCOUNTER — Other Ambulatory Visit: Payer: Self-pay

## 2020-04-10 DIAGNOSIS — M542 Cervicalgia: Secondary | ICD-10-CM | POA: Diagnosis not present

## 2020-04-10 DIAGNOSIS — G8929 Other chronic pain: Secondary | ICD-10-CM | POA: Diagnosis not present

## 2020-04-10 DIAGNOSIS — M25511 Pain in right shoulder: Secondary | ICD-10-CM | POA: Diagnosis not present

## 2020-04-10 NOTE — Therapy (Signed)
[place] [facility] [address], [place], [address] Phone: [phone]   Fax:  [phone]  Physical Therapy Treatment And Discharge Summary  Patient Details  Name: Kathy Long MRN: [id number] Date of Birth: June 27, 1950 Referring Provider (PT): [name], MD   Encounter Date: 04/10/2020   PT End of Session - 04/10/20 1635    Visit Number 6    Number of Visits 13    Date for PT Re-Evaluation 04/10/20    Authorization Type 6    Authorization Time Period of 10 progress report    PT Start Time 1638    PT Stop Time 1721    PT Time Calculation (min) 43 min    Activity Tolerance Patient tolerated treatment well    Behavior During Therapy [facility] for tasks assessed/performed           Past Medical History:  Diagnosis Date  . Acoustic neuroma (HCC)    left ear  . Anxiety   . Arthritis    lower back, hands  . Cancer (HCC)    melanoma, shoulder  . Chronic kidney disease   . Depression   . GERD (gastroesophageal reflux disease)   . Hyperlipidemia   . Hypertension   . Sleep apnea   . 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     Past Surgical History:  Procedure Laterality Date  . APPENDECTOMY  2010  . BACK SURGERY     disectomy lumbar, scar tissue  . BREAST CYST EXCISION Right 1970   axilla  . CATARACT EXTRACTION W/PHACO Left 04/12/2018   Procedure: CATARACT EXTRACTION PHACO AND INTRAOCULAR LENS PLACEMENT ([place])  LEFT TORIC LENS;  Surgeon: Leandrew Koyanagi, MD;  Location: [place];  Service: Ophthalmology;  Laterality: Left;  . CATARACT EXTRACTION W/PHACO Right 05/10/2018   Procedure: CATARACT EXTRACTION PHACO AND INTRAOCULAR LENS PLACEMENT ([place])  RIGHT TORIC LENS;  Surgeon: Leandrew Koyanagi, MD;  Location: [place];  Service: Ophthalmology;  Laterality: Right;  DIABETIC  . CHOLECYSTECTOMY N/A 07/05/2017   Procedure: LAPAROSCOPIC CHOLECYSTECTOMY WITH INTRAOPERATIVE CHOLANGIOGRAM;   Surgeon: Robert Bellow, MD;  Location: [facility];  Service: General;  Laterality: N/A;  . COLONOSCOPY     2009 and color gaurd in 2018  . COLONOSCOPY N/A 03/25/2020   Procedure: COLONOSCOPY;  Surgeon: [name], MD;  Location: [facility];  Service: Endoscopy;  Laterality: N/A;  . ERCP N/A 07/12/2017   Procedure: ENDOSCOPIC RETROGRADE CHOLANGIOPANCREATOGRAPHY (ERCP);  Surgeon: Lucilla Lame, MD;  Location: [facility] ENDOSCOPY;  Service: Endoscopy;  Laterality: N/A;  . ERCP N/A 10/04/2017   Procedure: ENDOSCOPIC RETROGRADE CHOLANGIOPANCREATOGRAPHY (ERCP);  Surgeon: Lucilla Lame, MD;  Location: [facility] ENDOSCOPY;  Service: Endoscopy;  Laterality: N/A;  . FOOT SURGERY     x 2  . MELANOMA EXCISION Left 2000  . PALATOPLASTY N/A 04/08/2015   Procedure: PALATOPLASTY;  Surgeon: Beverly Gust, MD;  Location: [facility];  Service: ENT;  Laterality: N/A;  . TONSILLECTOMY N/A 04/08/2015   Procedure: TONSILLECTOMY;  Surgeon: Beverly Gust, MD;  Location: [facility];  Service: ENT;  Laterality: N/A;  . TUBAL LIGATION      There were no vitals filed for this visit.   Subjective Assessment - 04/10/20 1639    Subjective R shoulder is doing well. Has been doing her exercises. Feels like she can continue wiht her HEP after today and graduate for her R shoulder. Has L hip pain. Was told that it was her piriformis. Bothers her a  lot in the middle of the night. Has a hard time getting out of bed to walk. Walking helps it out.  Staying still for a period of time then standing bothers her L hip. Has been busy at [place] due to the [place] shopping.    Pertinent History R shoulder pain. Gradual onset, 6 months ago. Dr. Derrel Nip is her PCP. Pt states that she has pain in R shoulder and L hip. Was referred to [name]. R shoulder was her main problem at the time. Pt has a hx of 2 back surgeries and has pain down her leg which comes and goes. Was given an injection R shoulder which helped. Was also given gabapentin  which did not help. Pain return 4 weeks after the shot. Pt is L hand dominant. 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    Patient Stated Goals Live without the shoulder pain.    Currently in Pain? Yes    Pain Score 1    At end range R shoulder flexion   Pain Onset More than a month ago                                     PT Education - 04/10/20 1818    Education Details ther-ex    Person(s) Educated Patient    Methods Explanation;Demonstration;Tactile cues;Verbal cues    Comprehension Returned demonstration;Verbalized understanding           Objective  No latex allergies  TTP R supraspinatus, distal scalene Tense R upper trap muscle No neck surgery hx (-) Empty can R      MedbridgeAccess Code [email or web address]    Therapeutic exercise  Seated manually resisted scapular retraction targeting lower trap   R 10x3 with 5 second holds   178 degrees R shoulder flexion AROM afterwards  Standing B shoulder ER yellow band 10x  Chin tucks 10x5 seconds  R first rib stretch with PT 15 seconds x 5  Shoulder flexion AROM multiple times to assess effectiveness of treatment  Improved exercise technique, movement at target joints, use of target muscles after min to mod verbal, visual, tactile cues.    Manual therapy  Seated STM R upper trap muscle to decrease tension and promote better glenohumeral mechanics  seated STM R superior and anterior shoulder to decrease fascial restrictions     Response to treatment Pt tolerated session well without aggravation of symptoms.      Clinical impression Pt has made very good progress with decreased R shoulder pain, improved strength, and function since initial evaluation. Pt has achieved all goals and demonstrates consistency and  independence with her HEP. Skilled physical therapy services discharged with patient continuing progress with her exercises at home.     PT Short Term Goals - 04/03/20 1124      PT SHORT TERM GOAL #1   Title Patient will be independent with her initial HEP to decrease pain, improve strenght and ability to reach with less difficulty.    Baseline Pt has started her HEP (02/28/2020); Pt performing her HEP independently (04/03/2020)    Time 3    Period Weeks    Status Achieved    Target Date 03/20/20  PT Long Term Goals - 04/10/20 1814      PT LONG TERM GOAL #1   Title Patient will have a decrease in R shoulder pain to 4/10 or less at most to promote ability to reach, as well look around more comfortably.    Baseline 8/10 R shoulder pain at most for the past 3 months (02/28/2020); 2/10 at most for the past 7 days (04/03/2020)    Time 6    Period Weeks    Status Achieved    Target Date 04/10/20      PT LONG TERM GOAL #2   Title Patient will improve her R shoulder ER strength by at least 1/2 MMT grade to promote ability to raise her arm up with less pain.    Baseline 4/5 R shoulder ER (02/28/2020); 4+/5 R shoulder ER (04/03/2020)    Time 6    Period Weeks    Status Achieved    Target Date 04/10/20      PT LONG TERM GOAL #3   Title Patient will improve her FOTO score by at least 10 points as a demonstration of improved function.    Baseline 68 (04/03/2020); 64 (04/10/2020)    Time 6    Period Weeks    Status Achieved    Target Date 04/10/20                 Plan - 04/10/20 1819    Clinical Impression Statement Pt has made very good progress with decreased R shoulder pain, improved strength, and function since initial evaluation. Pt has achieved all goals and demonstrates consistency and independence with her HEP. Skilled physical therapy services discharged with patient continuing progress with her exercises at home.    Personal Factors and Comorbidities  Age;Comorbidity 3+;Past/Current Experience;Time since onset of injury/illness/exacerbation    Comorbidities Depression, HTN, hx of CA    PT Treatment/Interventions Therapeutic activities;Therapeutic exercise;Neuromuscular re-education;Patient/family education;Manual techniques    PT Next Visit Plan Continue progress with her [place] and Agree with Plan of Care Patient           Patient will benefit from skilled therapeutic intervention in order to improve the following deficits and impairments:  Postural dysfunction, Improper body mechanics  Visit Diagnosis: Cervicalgia  Chronic right shoulder pain     Problem List Patient Active Problem List   Diagnosis Date Noted  . Positive colorectal cancer screening using Cologuard test 02/05/2020  . Esophageal dysphagia 12/11/2019  . Hyperlipidemia LDL goal <100 12/10/2019  . Right shoulder pain 10/17/2019  . Arthritis of right acromioclavicular joint 08/15/2019  . Piriformis syndrome of left side 08/15/2019  . OSA (obstructive sleep apnea) 05/13/2019  . CKD (chronic kidney disease) stage 3, GFR 30-59 ml/min (HCC) 04/13/2018  . Closed fracture of distal lateral malleolus of left ankle 12/28/2017  . Fitting and adjustment of gastrointestinal appliance and device   . 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 Axillary mass, right 08/06/2017  . S/P laparoscopic cholecystectomy 07/19/2017  . Hospital discharge follow-up 07/19/2017  . Postoperative bile leak   . Acoustic neuroma ([place]) 06/12/2017  . Colon cancer screening 03/08/2017  . Nocturia 12/09/2016  . Major depressive disorder, 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[place]) 06/05/2016  . Encounter for preventive health examination 03/07/2015  . Incisional hernia, without obstruction or gangrene 09/28/2014  . Obesity 09/28/2014  . Anxiety state 11/19/2013  . Gastro-esophageal reflux disease without esophagitis 11/19/2013  . Squamous cell  carcinoma of skin of face 10/21/2011  . 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  . Melanoma ([place]) 11/02/2010  . Hyperlipidemia LDL goal <130 04/10/2010  . Essential hypertension 04/10/2010  . Osteopenia 04/10/2010   Thank you for your referral.  Joneen Boers PT, DPT   04/10/2020, 6:25 PM  [place] [facility] [address], [place], [address] Phone: [phone]   Fax:  [phone]  Name: LYNDAL ALAMILLO MRN: [id number] Date of Birth: 02/04/1951

## 2020-04-14 ENCOUNTER — Ambulatory Visit: Payer: Medicare PPO

## 2020-06-02 ENCOUNTER — Ambulatory Visit
Admission: RE | Admit: 2020-06-02 | Discharge: 2020-06-02 | Disposition: A | Discharge status: Home / Self Care | Payer: Medicare PPO | Source: Ambulatory Visit | Attending: Internal Medicine | Admitting: Internal Medicine

## 2020-06-02 ENCOUNTER — Other Ambulatory Visit: Payer: Self-pay

## 2020-06-02 DIAGNOSIS — Z1231 Encounter for screening mammogram for malignant neoplasm of breast: Secondary | ICD-10-CM | POA: Insufficient documentation

## 2020-06-02 NOTE — Progress Notes (Signed)
[place] Phone: [phone] Subjective:   [name], am serving as a scribe for Dr. Hulan Saas. This visit occurred during the SARS-CoV-2 public health emergency.  Safety protocols were in place, including screening questions prior to the visit, additional usage of staff PPE, and extensive cleaning of exam room while observing appropriate contact time as indicated for disinfecting solutions.   I'm seeing this patient by the request  of:  [name], MD  CC: left hip pain   [id number]  Kathy Long is a 70 y.o. female coming in with complaint of left hip pain. Last seen in May 2021 for shoulder pain. 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 Is on her feet for 5 hours at a time. This helps her pain but at night her pain increases. Pain over left piriformis and left SI joint. Has a hard time getting out of car when she gets home from work. Has tried turmeric, collegen powder, Tylenol, and used Pennsaid/Voltaren. History of 2 back surgeries.  Patient states that the worst pain seems to be at night.  States that when she tries to get up has 8 out of 10 pain.  Then seems to get better when she is coming back from the restroom.  Patient states that this happens 3 times in the night.  Also notices after long drives has more tightness in the left buttocks region.  Intermittent radicular symptoms.  Reviewed patient's notes.  Patient was having more of a left-sided pain and did have a colonoscopy that was unremarkable.    Past Medical History:  Diagnosis Date  . Acoustic neuroma (HCC)    left ear  . Anxiety   . Arthritis    lower back, hands  . Cancer (HCC)    melanoma, shoulder  . Chronic kidney disease   . Depression   . GERD (gastroesophageal reflux disease)   . Hyperlipidemia   . Hypertension   . Sleep apnea   . 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    Past Surgical History:   Procedure Laterality Date  . APPENDECTOMY  2010  . BACK SURGERY     disectomy lumbar, scar tissue  . BREAST CYST EXCISION Right 1970   axilla  . CATARACT EXTRACTION W/PHACO Left 04/12/2018   Procedure: CATARACT EXTRACTION PHACO AND INTRAOCULAR LENS PLACEMENT ([place])  LEFT TORIC LENS;  Surgeon: Leandrew Koyanagi, MD;  Location: [place];  Service: Ophthalmology;  Laterality: Left;  . CATARACT EXTRACTION W/PHACO Right 05/10/2018   Procedure: CATARACT EXTRACTION PHACO AND INTRAOCULAR LENS PLACEMENT ([place])  RIGHT TORIC LENS;  Surgeon: Leandrew Koyanagi, MD;  Location: [place];  Service: Ophthalmology;  Laterality: Right;  DIABETIC  . CHOLECYSTECTOMY N/A 07/05/2017   Procedure: LAPAROSCOPIC CHOLECYSTECTOMY WITH INTRAOPERATIVE CHOLANGIOGRAM;  Surgeon: Robert Bellow, MD;  Location: [facility];  Service: General;  Laterality: N/A;  . COLONOSCOPY     2009 and color gaurd in 2018  . COLONOSCOPY N/A 03/25/2020   Procedure: COLONOSCOPY;  Surgeon: [name], MD;  [facility];  Service: Endoscopy;  Laterality: N/A;  . ERCP N/A 07/12/2017   Procedure: ENDOSCOPIC RETROGRADE CHOLANGIOPANCREATOGRAPHY (ERCP);  Surgeon: Lucilla Lame, MD;  Location: [facility] ENDOSCOPY;  Service: Endoscopy;  Laterality: N/A;  . ERCP N/A 10/04/2017   Procedure: ENDOSCOPIC RETROGRADE CHOLANGIOPANCREATOGRAPHY (ERCP);  Surgeon: Lucilla Lame, MD;  Location: [facility] ENDOSCOPY;  Service: Endoscopy;  Laterality: N/A;  . FOOT SURGERY  x 2  . MELANOMA EXCISION Left 2000  . PALATOPLASTY N/A 04/08/2015   Procedure: PALATOPLASTY;  Surgeon: Beverly Gust, MD;  Location: [facility];  Service: ENT;  Laterality: N/A;  . TONSILLECTOMY N/A 04/08/2015   Procedure: TONSILLECTOMY;  Surgeon: Beverly Gust, MD;  Location: [facility];  Service: ENT;  Laterality: N/A;  . TUBAL LIGATION     Social History   Socioeconomic History  . Marital status: Married    Spouse name: Not on file  . Number of children:  Not on file  . Years of education: 68 , PhD  . Highest education level: Not on file  Occupational History  . Occupation: Optometrist  Tobacco Use  . Smoking status: Never Smoker  . Smokeless tobacco: Never Used  . Tobacco comment: smoked socially in college  Vaping Use  . Vaping Use: Never used  Substance and Sexual Activity  . Alcohol use: Yes    Alcohol/week: 0.0 standard drinks    Comment: occ - may have 1 glass wine/month  . Drug use: No  . Sexual activity: Not on file  Other Topics Concern  . Not on file  Social History Narrative  . Not on file   Social Determinants of Health   Financial Resource Strain: Not on file  Food Insecurity: Not on file  Transportation Needs: Not on file  Physical Activity: Not on file  Stress: Not on file  Social Connections: Not on file   Allergies  Allergen Reactions  . Morphine And Related Other (See Comments)    Chest pain  . Tramadol Other (See Comments)    tremors  . Tape Rash        Family History  Problem Relation Age of Onset  . Cancer Sister 48       ovarian ca  . Breast cancer Neg Hx      Current Outpatient Medications (Cardiovascular):  .  amLODipine (NORVASC) 5 MG tablet, TAKE ONE TABLET BY MOUTH EVERY DAY .  hydrochlorothiazide (HYDRODIURIL) 25 MG tablet, TAKE ONE TABLET EVERY DAY .  metoprolol succinate (TOPROL-XL) 25 MG 24 hr tablet, TAKE 1 TABLET BY MOUTH WITH OR IMMEDIATLEY FOLLOWING A MEAL EVERY DAY .  rosuvastatin (CRESTOR) 10 MG tablet, TAKE ONE TABLET BY MOUTH EVERY DAY   Current Outpatient Medications (Analgesics):  .  acetaminophen (TYLENOL) 325 MG tablet, Take 650 mg by mouth every 6 (six) hours as needed.    Current Outpatient Medications (Other):  .  citalopram (CELEXA) 20 MG tablet, TAKE ONE TABLET BY MOUTH EVERY DAY .  clobetasol (TEMOVATE) 0.05 % external solution, APPLY DAILY UNTIL RASH IS CLEAR .  Coenzyme Q10 100 MG capsule, Take 100 mg by mouth daily. .  Melatonin 3 MG CAPS, Take 3 mg by  mouth at bedtime. Marland Kitchen  omeprazole (PRILOSEC) 40 MG capsule, TAKE 1 CAPSULE BY MOUTH ONCE DAILY .  tiZANidine (ZANAFLEX) 2 MG tablet, Take 1 tablet (2 mg total) by mouth at bedtime.   Reviewed prior external information including notes and imaging from  primary care provider As well as notes that were available from care everywhere and other healthcare systems.  Past medical history, social, surgical and family history all reviewed in electronic medical record.  No pertanent information unless stated regarding to the chief complaint.   Review of Systems:  No headache, visual changes, nausea, vomiting, diarrhea, constipation, dizziness, abdominal pain, skin rash, fevers, chills, night sweats, weight loss, swollen lymph nodes, body aches, joint swelling, chest pain, shortness of breath, mood  changes. POSITIVE muscle aches  Objective  Blood pressure 120/88, pulse 60, height 5\' 2"  (1.575 m), weight 173 lb (78.5 kg), SpO2 99 %.   General: No apparent distress alert and oriented x3 mood and affect normal, dressed appropriately.  HEENT: Pupils equal, extraocular movements intact  Respiratory: Patient's speak in full sentences and does not appear short of breath  Cardiovascular: No lower extremity edema, non tender, no erythema  Low back exam does have some mild loss of lordosis.  Some tightness noted with straight leg test.  Positive Faber on the left.  Severely tender to palpation more over the gluteal area.  Mild discomfort in the paraspinal musculature of the lumbar spine.  Neurovascularly intact distally.  No pain with internal rotation of the hip.  No abdominal pain noted today as well.   [id number]; 15 additional minutes spent for Therapeutic exercises as stated in above notes.  This included exercises focusing on stretching, strengthening, with significant focus on eccentric aspects.   Long term goals include an improvement in range of motion, strength, endurance as well as avoiding reinjury.  Patient's frequency would include in 1-2 times a day, 3-5 times a week for a duration of 6-12 weeks. Low back exercises that included:  Pelvic tilt/bracing instruction to focus on control of the pelvic girdle and lower abdominal muscles  Glute strengthening exercises, focusing on proper firing of the glutes without engaging the low back muscles Proper stretching techniques for maximum relief for the hamstrings, hip flexors, low back and some rotation where tolerated   Proper technique shown and discussed handout in great detail with ATC.  All questions were discussed and answered.     Impression and Recommendations:     The above documentation has been reviewed and is accurate and complete , DO

## 2020-06-03 ENCOUNTER — Ambulatory Visit (INDEPENDENT_AMBULATORY_CARE_PROVIDER_SITE_OTHER): Payer: Medicare PPO

## 2020-06-03 ENCOUNTER — Ambulatory Visit: Payer: Medicare PPO | Admitting: Family Medicine

## 2020-06-03 ENCOUNTER — Encounter: Payer: Self-pay | Admitting: Family Medicine

## 2020-06-03 ENCOUNTER — Other Ambulatory Visit: Payer: Self-pay

## 2020-06-03 VITALS — BP 120/88 | HR 55 | Ht 62.0 in | Wt 173.0 lb

## 2020-06-03 DIAGNOSIS — I10 Essential (primary) hypertension: Secondary | ICD-10-CM | POA: Diagnosis not present

## 2020-06-03 DIAGNOSIS — M5136 Other intervertebral disc degeneration, lumbar region: Secondary | ICD-10-CM | POA: Diagnosis not present

## 2020-06-03 DIAGNOSIS — G5702 Lesion of sciatic nerve, left lower limb: Secondary | ICD-10-CM

## 2020-06-03 DIAGNOSIS — M5416 Radiculopathy, lumbar region: Secondary | ICD-10-CM

## 2020-06-03 DIAGNOSIS — R102 Pelvic and perineal pain: Secondary | ICD-10-CM | POA: Diagnosis not present

## 2020-06-03 MED ORDER — TIZANIDINE HCL 2 MG PO TABS: 2.0000 mg | ORAL_TABLET | Freq: Every day | ORAL | 0 refills | Status: DC

## 2020-06-03 NOTE — Assessment & Plan Note (Signed)
Patient is on metoprolol heart rate and blood pressure are little low.  Will discuss with primary care about possibly halfing medication

## 2020-06-03 NOTE — Patient Instructions (Signed)
Xray today Piriformis exercises Zanaflex 2mg  at night Tennis ball in back left pocket with any driving Pillow under stomach with sleeping Sleep number greater than 50 See me in 6 weeks, if not better will consider PT or imaging

## 2020-06-03 NOTE — Assessment & Plan Note (Addendum)
Patient still has signs and symptoms that is somewhat consistent with the piriformis syndrome.  Seems to be though more so at night at this time.  Patient does sleep on her stomach.  Does have a past medical history of obstructive sleep apnea.  Patient has a history of back surgery and will get new x-rays.  Low-dose Zanaflex given but only 2 mg secondary to patient's chronic kidney disease.  Patient's heart rate is at 55.  Patient is on metoprolol.  We discussed we will reach out to primary care and ask if decreasing metoprolol in half would be beneficial with patient having somewhat borderline low blood pressure and low heart rate at multiple recent visits. Discussed multiple side effects of medicine but starting at low dose but can cause low heart rate. Patient though was adamant she wanted to try the medicine. ATC was in the room. Patient understood risk.  Patient does sleep on her stomach and back extension that does cause potential radicular symptoms.  Will avoid anti-inflammatories secondary to patient's chronic kidney disease.  Home exercises given.  Patient declined formal physical therapy.  Follow-up again in 4 to 8 weeks.   Worsening pain consider formal physical therapy or MRI of lumbar and then abdomen and pelvis with and without contrast with history of melanoma.  Recent colonoscopy was unremarkable

## 2020-06-09 ENCOUNTER — Encounter: Payer: Self-pay | Admitting: Family Medicine

## 2020-06-09 NOTE — Telephone Encounter (Signed)
LMTCB

## 2020-06-11 ENCOUNTER — Ambulatory Visit: Payer: Medicare PPO | Admitting: Internal Medicine

## 2020-06-11 ENCOUNTER — Other Ambulatory Visit: Payer: Self-pay | Admitting: Internal Medicine

## 2020-07-14 ENCOUNTER — Telehealth: Payer: Self-pay | Admitting: Internal Medicine

## 2020-07-14 NOTE — Telephone Encounter (Signed)
Left message for patient to call back and schedule Medicare Annual Wellness Visit (AWV)   This should be a virtual visit only=30 minutes.  Last AWV 03/05/15; please schedule at anytime with Denisa O'Brien-Blaney at [facility].

## 2020-07-14 NOTE — Progress Notes (Signed)
[place] Phone: [phone] Subjective:   Kathy Long, am serving as a scribe for Dr. Hulan Saas. This visit occurred during the SARS-CoV-2 public health emergency.  Safety protocols were in place, including screening questions prior to the visit, additional usage of staff PPE, and extensive cleaning of exam room while observing appropriate contact time as indicated for disinfecting solutions.   I'm seeing this patient by the request  of:  [name], MD  CC: Low back pain follow-up  [id number]   06/03/2020 Patient is on metoprolol heart rate and blood pressure are little low.  Will discuss with primary care about possibly halfing medication  Patient still has signs and symptoms that is somewhat consistent with the piriformis syndrome.  Seems to be though more so at night at this time.  Patient does sleep on her stomach.  Does have a past medical history of obstructive sleep apnea.  Patient has a history of back surgery and will get new x-rays.  Low-dose Zanaflex given but only 2 mg secondary to patient's chronic kidney disease.  Patient's heart rate is at 55.  Patient is on metoprolol.  We discussed we will reach out to primary care and ask if decreasing metoprolol in half would be beneficial with patient having somewhat borderline low blood pressure and low heart rate at multiple recent visits. Discussed multiple side effects of medicine but starting at low dose but can cause low heart rate. Patient though was adamant she wanted to try the medicine. ATC was in the room. Patient understood risk.  Patient does sleep on her stomach and back extension that does cause potential radicular symptoms.  Will avoid anti-inflammatories secondary to patient's chronic kidney disease.  Home exercises given.  Patient declined formal physical therapy.  Follow-up again in 4 to 8 weeks.   Worsening pain consider formal physical  therapy or MRI of lumbar and then abdomen and pelvis with and without contrast with history of melanoma.  Recent colonoscopy was unremarkable  Update 07/14/2020 Kathy Long is a 70 y.o. female coming in with complaint of back pain. Patient states that she been doing exercises which does help but she is not consistent with exercises. Hard to do PT because of work schedule. Has changed sleep number in her bed.   Patient had IV drip as a birthday gift which helped with pain and inflammation. Wants to know if we recommend health bar.   Patient has been using mm relaxer. Prescription ran out. Has a hard time getting out of bed due to pain at night. Wants to see if she is able to continue 1.5 pill dosage at night for pain.  Patient has only been doing it very intermittently and has not done it for the last couple weeks.  States that if she had it she would think it would help her with some of the sleep though.      Past Medical History:  Diagnosis Date  . Acoustic neuroma (HCC)    left ear  . Anxiety   . Arthritis    lower back, hands  . Cancer (HCC)    melanoma, shoulder  . Chronic kidney disease   . Depression   . GERD (gastroesophageal reflux disease)   . Hyperlipidemia   . Hypertension   . Sleep apnea   . 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    Past Surgical History:  Procedure Laterality Date  . APPENDECTOMY  2010  . BACK SURGERY     disectomy lumbar, scar tissue  . BREAST CYST EXCISION Right 1970   axilla  . CATARACT EXTRACTION W/PHACO Left 04/12/2018   Procedure: CATARACT EXTRACTION PHACO AND INTRAOCULAR LENS PLACEMENT ([place])  LEFT TORIC LENS;  Surgeon: Leandrew Koyanagi, MD;  Location: [place];  Service: Ophthalmology;  Laterality: Left;  . CATARACT EXTRACTION W/PHACO Right 05/10/2018   Procedure: CATARACT EXTRACTION PHACO AND INTRAOCULAR LENS PLACEMENT ([place])  RIGHT TORIC LENS;  Surgeon: Leandrew Koyanagi, MD;  Location: [place];  Service:  Ophthalmology;  Laterality: Right;  DIABETIC  . CHOLECYSTECTOMY N/A 07/05/2017   Procedure: LAPAROSCOPIC CHOLECYSTECTOMY WITH INTRAOPERATIVE CHOLANGIOGRAM;  Surgeon: Robert Bellow, MD;  Location: [facility];  Service: General;  Laterality: N/A;  . COLONOSCOPY     2009 and color gaurd in 2018  . COLONOSCOPY N/A 03/25/2020   Procedure: COLONOSCOPY;  Surgeon: [name], MD;  Location: [facility] ENDOSCOPY;  Service: Endoscopy;  Laterality: N/A;  . ERCP N/A 07/12/2017   Procedure: ENDOSCOPIC RETROGRADE CHOLANGIOPANCREATOGRAPHY (ERCP);  Surgeon: Lucilla Lame, MD;  Location: [facility] ENDOSCOPY;  Service: Endoscopy;  Laterality: N/A;  . ERCP N/A 10/04/2017   Procedure: ENDOSCOPIC RETROGRADE CHOLANGIOPANCREATOGRAPHY (ERCP);  Surgeon: Lucilla Lame, MD;  Location: [facility] ENDOSCOPY;  Service: Endoscopy;  Laterality: N/A;  . FOOT SURGERY     x 2  . MELANOMA EXCISION Left 2000  . PALATOPLASTY N/A 04/08/2015   Procedure: PALATOPLASTY;  Surgeon: Beverly Gust, MD;  Location: [facility];  Service: ENT;  Laterality: N/A;  . TONSILLECTOMY N/A 04/08/2015   Procedure: TONSILLECTOMY;  Surgeon: Beverly Gust, MD;  Location: [facility];  Service: ENT;  Laterality: N/A;  . TUBAL LIGATION     Social History   Socioeconomic History  . Marital status: Married    Spouse name: Not on file  . Number of children: Not on file  . Years of education: 45 , PhD  . Highest education level: Not on file  Occupational History  . Occupation: Optometrist  Tobacco Use  . Smoking status: Never Smoker  . Smokeless tobacco: Never Used  . Tobacco comment: smoked socially in college  Vaping Use  . Vaping Use: Never used  Substance and Sexual Activity  . Alcohol use: Yes    Alcohol/week: 0.0 standard drinks    Comment: occ - may have 1 glass wine/month  . Drug use: Long  . Sexual activity: Not on file  Other Topics Concern  . Not on file  Social History Narrative  . Not on file   Social Determinants of Health   Financial  Resource Strain: Not on file  Food Insecurity: Not on file  Transportation Needs: Not on file  Physical Activity: Not on file  Stress: Not on file  Social Connections: Not on file   Allergies  Allergen Reactions  . Morphine And Related Other (See Comments)    Chest pain  . Tramadol Other (See Comments)    tremors  . Tape Rash        Family History  Problem Relation Age of Onset  . Cancer Sister 63       ovarian ca  . Breast cancer Neg Hx      Current Outpatient Medications (Cardiovascular):  .  amLODipine (NORVASC) 5 MG tablet, TAKE 1 TABLET BY MOUTH DAILY .  hydrochlorothiazide (HYDRODIURIL) 25 MG tablet, TAKE ONE TABLET EVERY DAY .  metoprolol succinate (TOPROL-XL) 25 MG 24 hr tablet, TAKE 1 TABLET BY MOUTH WITH OR IMMEDIATLEY FOLLOWING  A MEAL EVERY DAY .  rosuvastatin (CRESTOR) 10 MG tablet, TAKE ONE TABLET BY MOUTH EVERY DAY   Current Outpatient Medications (Analgesics):  .  acetaminophen (TYLENOL) 325 MG tablet, Take 650 mg by mouth every 6 (six) hours as needed.    Current Outpatient Medications (Other):  .  citalopram (CELEXA) 20 MG tablet, TAKE 1 TABLET BY MOUTH DAILY .  clobetasol (TEMOVATE) 0.05 % external solution, APPLY DAILY UNTIL RASH IS CLEAR .  Coenzyme Q10 100 MG capsule, Take 100 mg by mouth daily. .  Melatonin 3 MG CAPS, Take 3 mg by mouth at bedtime. Marland Kitchen  omeprazole (PRILOSEC) 40 MG capsule, TAKE 1 CAPSULE BY MOUTH ONCE DAILY .  tiZANidine (ZANAFLEX) 2 MG tablet, Take 1 tablet (2 mg total) by mouth at bedtime. Marland Kitchen  tiZANidine (ZANAFLEX) 2 MG tablet, Take 1 tablet (2 mg total) by mouth at bedtime.   Reviewed prior external information including notes and imaging from  primary care provider As well as notes that were available from care everywhere and other healthcare systems.  Past medical history, social, surgical and family history all reviewed in electronic medical record.  Long pertanent information unless stated regarding to the chief complaint.    Review of Systems:  Long headache, visual changes, nausea, vomiting, diarrhea, constipation, dizziness, abdominal pain, skin rash, fevers, chills, night sweats, weight loss, swollen lymph nodes,  joint swelling, chest pain, shortness of breath, mood changes. POSITIVE muscle aches, body aches  Objective  Blood pressure 130/88, pulse (!) 54, height 5\' 2"  (1.575 m), weight 172 lb (78 kg), SpO2 99 %.   General: Long apparent distress alert and oriented x3 mood and affect normal, dressed appropriately.  HEENT: Pupils equal, extraocular movements intact  Respiratory: Patient's speak in full sentences and does not appear short of breath  Cardiovascular: Long lower extremity edema,  Gait normal with good balance and coordination.  MSK: Mild arthritic changes noted. Patient still has tightness with piriformis on the left side.  Patient does have some mild discomfort in the lower lumbar spine but good strength of the lower extremities.  Patient is able to get out of the chair with Long significant difficulty    Impression and Recommendations:     The above documentation has been reviewed and is accurate and complete Lyndal Pulley, DO

## 2020-07-15 ENCOUNTER — Encounter: Payer: Self-pay | Admitting: Family Medicine

## 2020-07-15 ENCOUNTER — Other Ambulatory Visit: Payer: Self-pay

## 2020-07-15 ENCOUNTER — Ambulatory Visit: Payer: Medicare PPO | Admitting: Family Medicine

## 2020-07-15 VITALS — BP 130/88 | HR 54 | Ht 62.0 in | Wt 172.0 lb

## 2020-07-15 DIAGNOSIS — M2550 Pain in unspecified joint: Secondary | ICD-10-CM

## 2020-07-15 DIAGNOSIS — G5702 Lesion of sciatic nerve, left lower limb: Secondary | ICD-10-CM | POA: Diagnosis not present

## 2020-07-15 LAB — CBC WITH DIFFERENTIAL/PLATELET
Basophils Absolute: 0.1 10*3/uL (ref 0.0–0.1)
Basophils Relative: 1.1 % (ref 0.0–3.0)
Eosinophils Absolute: 0.3 10*3/uL (ref 0.0–0.7)
Eosinophils Relative: 5.5 % — ABNORMAL HIGH (ref 0.0–5.0)
HCT: 40.4 % (ref 36.0–46.0)
Hemoglobin: 13.6 g/dL (ref 12.0–15.0)
Lymphocytes Relative: 23.2 % (ref 12.0–46.0)
Lymphs Abs: 1.2 10*3/uL (ref 0.7–4.0)
MCHC: 33.6 g/dL (ref 30.0–36.0)
MCV: 94.1 fl (ref 78.0–100.0)
Monocytes Absolute: 0.5 10*3/uL (ref 0.1–1.0)
Monocytes Relative: 8.9 % (ref 3.0–12.0)
Neutro Abs: 3.1 10*3/uL (ref 1.4–7.7)
Neutrophils Relative %: 61.3 % (ref 43.0–77.0)
Platelets: 214 10*3/uL (ref 150.0–400.0)
RBC: 4.29 Mil/uL (ref 3.87–5.11)
RDW: 13.1 % (ref 11.5–15.5)
WBC: 5.1 10*3/uL (ref 4.0–10.5)

## 2020-07-15 LAB — COMPREHENSIVE METABOLIC PANEL
ALT: 23 U/L (ref 0–35)
AST: 20 U/L (ref 0–37)
Albumin: 4.2 g/dL (ref 3.5–5.2)
Alkaline Phosphatase: 69 U/L (ref 39–117)
BUN: 21 mg/dL (ref 6–23)
CO2: 31 mEq/L (ref 19–32)
Calcium: 9.6 mg/dL (ref 8.4–10.5)
Chloride: 105 mEq/L (ref 96–112)
Creatinine, Ser: 1.02 mg/dL (ref 0.40–1.20)
GFR: 55.14 mL/min — ABNORMAL LOW (ref >60.00)
Glucose, Bld: 95 mg/dL (ref 70–99)
Potassium: 4.3 mEq/L (ref 3.5–5.1)
Sodium: 143 mEq/L (ref 135–145)
Total Bilirubin: 1.1 mg/dL (ref 0.2–1.2)
Total Protein: 7.1 g/dL (ref 6.0–8.3)

## 2020-07-15 LAB — VITAMIN D 25 HYDROXY (VIT D DEFICIENCY, FRACTURES): VITD: 64.74 ng/mL (ref 30.00–100.00)

## 2020-07-15 LAB — URIC ACID: Uric Acid, Serum: 5.6 mg/dL (ref 2.4–7.0)

## 2020-07-15 LAB — SEDIMENTATION RATE: Sed Rate: 13 mm/hr (ref 0–30)

## 2020-07-15 MED ORDER — TIZANIDINE HCL 2 MG PO TABS: 2.0000 mg | ORAL_TABLET | Freq: Every day | ORAL | 0 refills | Status: DC

## 2020-07-15 NOTE — Assessment & Plan Note (Signed)
Patient feels like she is responding well to the Zanaflex but is not taking it regularly.  We discussed with patient again at great length about using it only intermittently.  We will get laboratory work-up to further evaluate patient's kidney function if we are doing this somewhat long-term.  Encouraged her to follow-up with primary care as well as her nephrologist.  We discussed with her regarding her back that I do feel that an MRI could be necessary with patient's past medical history if she starts having any worsening pain.  Patient at this point would like to continue with the conservative therapy.  Does not want to do formal physical therapy.  Encouraged her to continue with the home exercises.  Follow-up with me again in 2 months

## 2020-07-15 NOTE — Patient Instructions (Signed)
Zanaflex-use sparingly 2mg  Labs today  Continue to do exercises If worsening pain, recommend MRI Otherwise check in 2 months from now

## 2020-07-16 ENCOUNTER — Other Ambulatory Visit: Payer: Self-pay | Admitting: Internal Medicine

## 2020-07-17 LAB — PTH, INTACT AND CALCIUM
Calcium: 9.5 mg/dL (ref 8.6–10.4)
PTH: 45 pg/mL (ref 14–64)

## 2020-08-01 DIAGNOSIS — D0361 Melanoma in situ of right upper limb, including shoulder: Secondary | ICD-10-CM | POA: Diagnosis not present

## 2020-08-01 DIAGNOSIS — Z09 Encounter for follow-up examination after completed treatment for conditions other than malignant neoplasm: Secondary | ICD-10-CM | POA: Diagnosis not present

## 2020-08-01 DIAGNOSIS — Z85820 Personal history of malignant melanoma of skin: Secondary | ICD-10-CM | POA: Diagnosis not present

## 2020-08-01 DIAGNOSIS — Z08 Encounter for follow-up examination after completed treatment for malignant neoplasm: Secondary | ICD-10-CM | POA: Diagnosis not present

## 2020-08-01 DIAGNOSIS — D485 Neoplasm of uncertain behavior of skin: Secondary | ICD-10-CM | POA: Diagnosis not present

## 2020-08-01 DIAGNOSIS — Z85828 Personal history of other malignant neoplasm of skin: Secondary | ICD-10-CM | POA: Diagnosis not present

## 2020-08-01 DIAGNOSIS — L570 Actinic keratosis: Secondary | ICD-10-CM | POA: Diagnosis not present

## 2020-08-07 ENCOUNTER — Other Ambulatory Visit: Payer: Self-pay | Admitting: Internal Medicine

## 2020-08-07 DIAGNOSIS — G4733 Obstructive sleep apnea (adult) (pediatric): Secondary | ICD-10-CM

## 2020-09-05 DIAGNOSIS — D0361 Melanoma in situ of right upper limb, including shoulder: Secondary | ICD-10-CM | POA: Diagnosis not present

## 2020-09-11 NOTE — Progress Notes (Deleted)
[place] Phone: [phone] Subjective:    I'm seeing this patient by the request  of:  [name], MD  CC:   [id number]   07/15/2020 Patient feels like she is responding well to the Zanaflex but is not taking it regularly.  We discussed with patient again at great length about using it only intermittently.  We will get laboratory work-up to further evaluate patient's kidney function if we are doing this somewhat long-term.  Encouraged her to follow-up with primary care as well as her nephrologist.  We discussed with her regarding her back that I do feel that an MRI could be necessary with patient's past medical history if she starts having any worsening pain.  Patient at this point would like to continue with the conservative therapy.  Does not want to do formal physical therapy.  Encouraged her to continue with the home exercises.  Follow-up with me again in 2 months  Update 09/11/2020 KARILYNN CARRANZA is a 70 y.o. female coming in with complaint of L piriformis pain. Patient states   Onset-  Location Duration-  Character- Aggravating factors- Reliving factors-  Therapies tried-  Severity-     Past Medical History:  Diagnosis Date  . Acoustic neuroma (HCC)    left ear  . Anxiety   . Arthritis    lower back, hands  . Cancer (HCC)    melanoma, shoulder  . Chronic kidney disease   . Depression   . GERD (gastroesophageal reflux disease)   . Hyperlipidemia   . Hypertension   . Sleep apnea   . 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    Past Surgical History:  Procedure Laterality Date  . APPENDECTOMY  2010  . BACK SURGERY     disectomy lumbar, scar tissue  . BREAST CYST EXCISION Right 1970   axilla  . CATARACT EXTRACTION W/PHACO Left 04/12/2018   Procedure: CATARACT EXTRACTION PHACO AND INTRAOCULAR LENS PLACEMENT ([place])  LEFT TORIC LENS;  Surgeon: Leandrew Koyanagi, MD;  Location: [place];  Service: Ophthalmology;  Laterality: Left;  . CATARACT EXTRACTION W/PHACO Right 05/10/2018   Procedure: CATARACT EXTRACTION PHACO AND INTRAOCULAR LENS PLACEMENT ([place])  RIGHT TORIC LENS;  Surgeon: Leandrew Koyanagi, MD;  Location: [facility];  Service: Ophthalmology;  Laterality: Right;  DIABETIC  . CHOLECYSTECTOMY N/A 07/05/2017   Procedure: LAPAROSCOPIC CHOLECYSTECTOMY WITH INTRAOPERATIVE CHOLANGIOGRAM;  Surgeon: Robert Bellow, MD;  Location: [facility];  Service: General;  Laterality: N/A;  . COLONOSCOPY     2009 and color gaurd in 2018  . COLONOSCOPY N/A 03/25/2020   Procedure: COLONOSCOPY;  Surgeon: [name], MD;  Location: [facility] ENDOSCOPY;  Service: Endoscopy;  Laterality: N/A;  . ERCP N/A 07/12/2017   Procedure: ENDOSCOPIC RETROGRADE CHOLANGIOPANCREATOGRAPHY (ERCP);  Surgeon: Lucilla Lame, MD;  Location: [facility] ENDOSCOPY;  Service: Endoscopy;  Laterality: N/A;  . ERCP N/A 10/04/2017   Procedure: ENDOSCOPIC RETROGRADE CHOLANGIOPANCREATOGRAPHY (ERCP);  Surgeon: Lucilla Lame, MD;  Location: [facility] ENDOSCOPY;  Service: Endoscopy;  Laterality: N/A;  . FOOT SURGERY     x 2  . MELANOMA EXCISION Left 2000  . PALATOPLASTY N/A 04/08/2015   Procedure: PALATOPLASTY;  Surgeon: Beverly Gust, MD;  Location: [facility];  Service: ENT;  Laterality: N/A;  . TONSILLECTOMY N/A 04/08/2015   Procedure: TONSILLECTOMY;  Surgeon: Beverly Gust, MD;  Location: [facility]S;  Service: ENT;  Laterality: N/A;  . TUBAL LIGATION     Social  History   Socioeconomic History  . Marital status: Married    Spouse name: Not on file  . Number of children: Not on file  . Years of education: 71 , PhD  . Highest education level: Not on file  Occupational History  . Occupation: Optometrist  Tobacco Use  . Smoking status: Never Smoker  . Smokeless tobacco: Never Used  . Tobacco comment: smoked socially in college  Vaping Use  . Vaping Use: Never used  Substance and Sexual Activity  . Alcohol use:  Yes    Alcohol/week: 0.0 standard drinks    Comment: occ - may have 1 glass wine/month  . Drug use: No  . Sexual activity: Not on file  Other Topics Concern  . Not on file  Social History Narrative  . Not on file   Social Determinants of Health   Financial Resource Strain: Not on file  Food Insecurity: Not on file  Transportation Needs: Not on file  Physical Activity: Not on file  Stress: Not on file  Social Connections: Not on file   Allergies  Allergen Reactions  . Morphine And Related Other (See Comments)    Chest pain  . Tramadol Other (See Comments)    tremors  . Tape Rash        Family History  Problem Relation Age of Onset  . Cancer Sister 97       ovarian ca  . Breast cancer Neg Hx      Current Outpatient Medications (Cardiovascular):  .  amLODipine (NORVASC) 5 MG tablet, TAKE 1 TABLET BY MOUTH DAILY .  hydrochlorothiazide (HYDRODIURIL) 25 MG tablet, TAKE ONE TABLET EVERY DAY .  metoprolol succinate (TOPROL-XL) 25 MG 24 hr tablet, TAKE 1 TABLET BY MOUTH WITH OR IMMEDIATLEY FOLLOWING A MEAL EVERY DAY .  rosuvastatin (CRESTOR) 10 MG tablet, TAKE ONE TABLET BY MOUTH EVERY DAY   Current Outpatient Medications (Analgesics):  .  acetaminophen (TYLENOL) 325 MG tablet, Take 650 mg by mouth every 6 (six) hours as needed.    Current Outpatient Medications (Other):  .  citalopram (CELEXA) 20 MG tablet, TAKE 1 TABLET BY MOUTH DAILY .  clobetasol (TEMOVATE) 0.05 % external solution, APPLY DAILY UNTIL RASH IS CLEAR .  Coenzyme Q10 100 MG capsule, Take 100 mg by mouth daily. .  Melatonin 3 MG CAPS, Take 3 mg by mouth at bedtime. Marland Kitchen  omeprazole (PRILOSEC) 40 MG capsule, TAKE 1 CAPSULE BY MOUTH ONCE DAILY .  tiZANidine (ZANAFLEX) 2 MG tablet, Take 1 tablet (2 mg total) by mouth at bedtime. Marland Kitchen  tiZANidine (ZANAFLEX) 2 MG tablet, Take 1 tablet (2 mg total) by mouth at bedtime.   Reviewed prior external information including notes and imaging from  primary care  provider As well as notes that were available from care everywhere and other healthcare systems.  Past medical history, social, surgical and family history all reviewed in electronic medical record.  No pertanent information unless stated regarding to the chief complaint.   Review of Systems:  No headache, visual changes, nausea, vomiting, diarrhea, constipation, dizziness, abdominal pain, skin rash, fevers, chills, night sweats, weight loss, swollen lymph nodes, body aches, joint swelling, chest pain, shortness of breath, mood changes. POSITIVE muscle aches  Objective  There were no vitals taken for this visit.   General: No apparent distress alert and oriented x3 mood and affect normal, dressed appropriately.  HEENT: Pupils equal, extraocular movements intact  Respiratory: Patient's speak in full sentences and does not appear short  of breath  Cardiovascular: No lower extremity edema, non tender, no erythema  Gait normal with good balance and coordination.  MSK:  Non tender with full range of motion and good stability and symmetric strength and tone of shoulders, elbows, wrist, hip, knee and ankles bilaterally.     Impression and Recommendations:     The above documentation has been reviewed and is accurate and complete Jacqualin Combes

## 2020-09-16 ENCOUNTER — Ambulatory Visit: Payer: Medicare PPO | Admitting: Family Medicine

## 2020-09-26 DIAGNOSIS — S93401A Sprain of unspecified ligament of right ankle, initial encounter: Secondary | ICD-10-CM | POA: Diagnosis not present

## 2020-10-06 ENCOUNTER — Encounter: Payer: Self-pay | Admitting: Family Medicine

## 2020-10-07 ENCOUNTER — Other Ambulatory Visit: Payer: Self-pay | Admitting: Internal Medicine

## 2020-10-20 ENCOUNTER — Ambulatory Visit: Payer: Medicare PPO | Admitting: Podiatry

## 2020-10-20 ENCOUNTER — Other Ambulatory Visit: Payer: Self-pay | Admitting: Podiatry

## 2020-10-20 ENCOUNTER — Encounter: Payer: Self-pay | Admitting: *Deleted

## 2020-10-20 ENCOUNTER — Ambulatory Visit (INDEPENDENT_AMBULATORY_CARE_PROVIDER_SITE_OTHER): Payer: Medicare PPO

## 2020-10-20 ENCOUNTER — Encounter: Payer: Self-pay | Admitting: Podiatry

## 2020-10-20 ENCOUNTER — Other Ambulatory Visit: Payer: Self-pay

## 2020-10-20 DIAGNOSIS — M76821 Posterior tibial tendinitis, right leg: Secondary | ICD-10-CM | POA: Diagnosis not present

## 2020-10-20 DIAGNOSIS — M722 Plantar fascial fibromatosis: Secondary | ICD-10-CM | POA: Diagnosis not present

## 2020-10-20 DIAGNOSIS — M76822 Posterior tibial tendinitis, left leg: Secondary | ICD-10-CM

## 2020-10-20 DIAGNOSIS — M7751 Other enthesopathy of right foot: Secondary | ICD-10-CM

## 2020-10-20 MED ORDER — MELOXICAM 15 MG PO TABS: 15.0000 mg | ORAL_TABLET | Freq: Every day | ORAL | 3 refills | Status: DC

## 2020-10-20 MED ORDER — METHYLPREDNISOLONE 4 MG PO TBPK: ORAL_TABLET | ORAL | 0 refills | Status: DC

## 2020-10-20 MED ORDER — TRIAMCINOLONE ACETONIDE 40 MG/ML IJ SUSP
40.0000 mg | Freq: Once | INTRAMUSCULAR | Status: AC
  Administered 2020-10-20: 40 mg

## 2020-10-20 NOTE — Progress Notes (Signed)
She presents today chief complaint of pain to the medial ankle and foot right.  States that that she is noticing getting worse over the past several weeks woke up with it hurting May 3 I denied any trauma to it she works a part-time jobs that [employer] in [place] she is worn the boot and brace and now her left foot is starting to hurt.  Objective: Vital signs stable alert oriented x3.  Pulses are palpable.  She has flattening of the right foot with collapse of medial longitudinal arch and pain along the posterior tibial tendon course particular around the navicular tuberosity and plantarly.  Contralateral foot demonstrates pain on palpation medial calcaneal tubercle left.  Radiographs taken today demonstrate collapse of the right foot and soft tissue swelling at the plantar fascial kidney insertion site left foot.  Assessment: Plan fasciitis left.  Made longitudinal arch collapse right.  Plan: Discussed etiology pathology conservative surgical therapies at this point she would like an injection to the left heel and medial aspect of the right foot.  Start her on a Medrol Dosepak to be followed by meloxicam.  Encouraged her to continue to wear the cam walker.  May need to refer her to Dr. Sherryle Lis or Dr. March Rummage for subtalar joint repair or fusion as well as a talonavicular fusion and posterior tibial tendon evaluation.  Follow-up with her in 1 month if right foot is not considerably better then we will consider for an MRI.

## 2020-10-30 NOTE — Progress Notes (Deleted)
[place] Phone: [phone] Subjective:    I'm seeing this patient by the request  of:  [name], MD  CC:   [id number]   07/15/2020 Patient feels like she is responding well to the Zanaflex but is not taking it regularly.  We discussed with patient again at great length about using it only intermittently.  We will get laboratory work-up to further evaluate patient's kidney function if we are doing this somewhat long-term.  Encouraged her to follow-up with primary care as well as her nephrologist.  We discussed with her regarding her back that I do feel that an MRI could be necessary with patient's past medical history if she starts having any worsening pain.  Patient at this point would like to continue with the conservative therapy.  Does not want to do formal physical therapy.  Encouraged her to continue with the home exercises.  Follow-up with me again in 2 months  Update 11/04/2020 Kathy Long is a 70 y.o. female coming in with complaint of L hip and foot pain. Medial ankle pain that has worsened near beginning of May. Works part-time on her feet at [employer]. Diagnosed by podiatry as having post tib tendonitis and plantar fasciitis. Patient states       Past Medical History:  Diagnosis Date  . Acoustic neuroma (HCC)    left ear  . Anxiety   . Arthritis    lower back, hands  . Cancer (HCC)    melanoma, shoulder  . Chronic kidney disease   . Depression   . GERD (gastroesophageal reflux disease)   . Hyperlipidemia   . Hypertension   . Sleep apnea   . 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    Past Surgical History:  Procedure Laterality Date  . APPENDECTOMY  2010  . BACK SURGERY     disectomy lumbar, scar tissue  . BREAST CYST EXCISION Right 1970   axilla  . CATARACT EXTRACTION W/PHACO Left 04/12/2018   Procedure: CATARACT EXTRACTION PHACO AND INTRAOCULAR LENS PLACEMENT ([place])  LEFT TORIC  LENS;  Surgeon: Leandrew Koyanagi, MD;  Location: [place];  Service: Ophthalmology;  Laterality: Left;  . CATARACT EXTRACTION W/PHACO Right 05/10/2018   Procedure: CATARACT EXTRACTION PHACO AND INTRAOCULAR LENS PLACEMENT ([place])  RIGHT TORIC LENS;  Surgeon: Leandrew Koyanagi, MD;  Location: [place];  Service: Ophthalmology;  Laterality: Right;  DIABETIC  . CHOLECYSTECTOMY N/A 07/05/2017   Procedure: LAPAROSCOPIC CHOLECYSTECTOMY WITH INTRAOPERATIVE CHOLANGIOGRAM;  Surgeon: Robert Bellow, MD;  Location: [facility];  Service: General;  Laterality: N/A;  . COLONOSCOPY     2009 and color gaurd in 2018  . COLONOSCOPY N/A 03/25/2020   Procedure: COLONOSCOPY;  Surgeon: [name], MD;  Location: [facility] ENDOSCOPY;  Service: Endoscopy;  Laterality: N/A;  . ERCP N/A 07/12/2017   Procedure: ENDOSCOPIC RETROGRADE CHOLANGIOPANCREATOGRAPHY (ERCP);  Surgeon: Lucilla Lame, MD;  Location: [facility] ENDOSCOPY;  Service: Endoscopy;  Laterality: N/A;  . ERCP N/A 10/04/2017   Procedure: ENDOSCOPIC RETROGRADE CHOLANGIOPANCREATOGRAPHY (ERCP);  Surgeon: Lucilla Lame, MD;  Location: [facility] ENDOSCOPY;  Service: Endoscopy;  Laterality: N/A;  . FOOT SURGERY     x 2  . MELANOMA EXCISION Left 2000  . PALATOPLASTY N/A 04/08/2015   Procedure: PALATOPLASTY;  Surgeon: Beverly Gust, MD;  Location: [facility]S;  Service: ENT;  Laterality: N/A;  . TONSILLECTOMY N/A 04/08/2015   Procedure: TONSILLECTOMY;  Surgeon: Beverly Gust, MD;  Location: [facility]  ORS;  Service: ENT;  Laterality: N/A;  . TUBAL LIGATION     Social History   Socioeconomic History  . Marital status: Married    Spouse name: Not on file  . Number of children: Not on file  . Years of education: 67 , PhD  . Highest education level: Not on file  Occupational History  . Occupation: Optometrist  Tobacco Use  . Smoking status: Never Smoker  . Smokeless tobacco: Never Used  . Tobacco comment: smoked socially in college  Vaping Use  .  Vaping Use: Never used  Substance and Sexual Activity  . Alcohol use: Yes    Alcohol/week: 0.0 standard drinks    Comment: occ - may have 1 glass wine/month  . Drug use: No  . Sexual activity: Not on file  Other Topics Concern  . Not on file  Social History Narrative  . Not on file   Social Determinants of Health   Financial Resource Strain: Not on file  Food Insecurity: Not on file  Transportation Needs: Not on file  Physical Activity: Not on file  Stress: Not on file  Social Connections: Not on file   Allergies  Allergen Reactions  . Morphine And Related Other (See Comments)    Chest pain  . Tramadol Other (See Comments)    tremors  . Tape Rash        Family History  Problem Relation Age of Onset  . Cancer Sister 45       ovarian ca  . Breast cancer Neg Hx     Current Outpatient Medications (Endocrine & Metabolic):  .  methylPREDNISolone (MEDROL DOSEPAK) 4 MG TBPK tablet, 6 day dose pack - take as directed  Current Outpatient Medications (Cardiovascular):  .  amLODipine (NORVASC) 5 MG tablet, TAKE 1 TABLET BY MOUTH DAILY .  hydrochlorothiazide (HYDRODIURIL) 25 MG tablet, TAKE ONE TABLET EVERY DAY .  metoprolol succinate (TOPROL-XL) 25 MG 24 hr tablet, TAKE 1 TABLET BY MOUTH WITH OR IMMEDIATLEY FOLLOWING A MEAL EVERY DAY .  rosuvastatin (CRESTOR) 10 MG tablet, TAKE ONE TABLET BY MOUTH EVERY DAY   Current Outpatient Medications (Analgesics):  .  acetaminophen (TYLENOL) 325 MG tablet, Take 650 mg by mouth every 6 (six) hours as needed.  .  meloxicam (MOBIC) 15 MG tablet, Take 1 tablet (15 mg total) by mouth daily.   Current Outpatient Medications (Other):  Marland Kitchen  Cholecalciferol (VITAMIN D3) 25 MCG (1000 UT) CAPS,  .  citalopram (CELEXA) 20 MG tablet, TAKE 1 TABLET BY MOUTH DAILY .  clobetasol (TEMOVATE) 0.05 % external solution, APPLY DAILY UNTIL RASH IS CLEAR .  Coenzyme Q10 100 MG capsule, Take 100 mg by mouth daily. .  Melatonin 3 MG CAPS, Take 3 mg by mouth at  bedtime. Marland Kitchen  omeprazole (PRILOSEC) 40 MG capsule, TAKE 1 CAPSULE BY MOUTH ONCE DAILY .  tiZANidine (ZANAFLEX) 2 MG tablet, Take 1 tablet (2 mg total) by mouth at bedtime.   Reviewed prior external information including notes and imaging from  primary care provider As well as notes that were available from care everywhere and other healthcare systems.  Past medical history, social, surgical and family history all reviewed in electronic medical record.  No pertanent information unless stated regarding to the chief complaint.   Review of Systems:  No headache, visual changes, nausea, vomiting, diarrhea, constipation, dizziness, abdominal pain, skin rash, fevers, chills, night sweats, weight loss, swollen lymph nodes, body aches, joint swelling, chest pain, shortness of breath, mood  changes. POSITIVE muscle aches  Objective  There were no vitals taken for this visit.   General: No apparent distress alert and oriented x3 mood and affect normal, dressed appropriately.  HEENT: Pupils equal, extraocular movements intact  Respiratory: Patient's speak in full sentences and does not appear short of breath  Cardiovascular: No lower extremity edema, non tender, no erythema  Gait normal with good balance and coordination.  MSK:  Non tender with full range of motion and good stability and symmetric strength and tone of shoulders, elbows, wrist, hip, knee and ankles bilaterally.     Impression and Recommendations:     The above documentation has been reviewed and is accurate and complete Jacqualin Combes

## 2020-10-31 DIAGNOSIS — G4733 Obstructive sleep apnea (adult) (pediatric): Secondary | ICD-10-CM

## 2020-11-04 ENCOUNTER — Ambulatory Visit: Payer: Medicare PPO | Admitting: Family Medicine

## 2020-11-05 ENCOUNTER — Telehealth: Payer: Self-pay

## 2020-11-05 ENCOUNTER — Other Ambulatory Visit: Payer: Medicare PPO

## 2020-11-05 DIAGNOSIS — M722 Plantar fascial fibromatosis: Secondary | ICD-10-CM

## 2020-11-05 DIAGNOSIS — M76821 Posterior tibial tendinitis, right leg: Secondary | ICD-10-CM

## 2020-11-05 NOTE — Telephone Encounter (Signed)
Ok to go to pt for strengthening foot and leg and arch.

## 2020-11-05 NOTE — Telephone Encounter (Signed)
Pt called in and would like to know if she could have an order for PT to help her with her fallen arch. She states that she would like to start PT asap to ensure she is able to go back to work 11/28/2020.

## 2020-11-07 NOTE — Telephone Encounter (Signed)
Pt called the [facility] and stated she has not received a call to schedule PT. She still is in pain and unable to work 4 hour shifts standing and walking. She only gets a 15 min break.  She also wants a update on her matrix. Please advise

## 2020-11-11 NOTE — Addendum Note (Signed)
Addended by: Rip Harbour on: 11/11/2020 08:39 AM   Modules accepted: Orders

## 2020-11-12 ENCOUNTER — Ambulatory Visit: Payer: Self-pay | Admitting: Surgery

## 2020-11-12 DIAGNOSIS — K432 Incisional hernia without obstruction or gangrene: Secondary | ICD-10-CM | POA: Diagnosis not present

## 2020-11-13 DIAGNOSIS — M79676 Pain in unspecified toe(s): Secondary | ICD-10-CM

## 2020-11-26 ENCOUNTER — Encounter: Payer: Self-pay | Admitting: Podiatry

## 2020-11-26 ENCOUNTER — Other Ambulatory Visit: Payer: Self-pay

## 2020-11-26 ENCOUNTER — Ambulatory Visit: Payer: Medicare PPO | Admitting: Podiatry

## 2020-11-26 DIAGNOSIS — M19279 Secondary osteoarthritis, unspecified ankle and foot: Secondary | ICD-10-CM

## 2020-11-26 DIAGNOSIS — M76821 Posterior tibial tendinitis, right leg: Secondary | ICD-10-CM

## 2020-11-26 DIAGNOSIS — M66871 Spontaneous rupture of other tendons, right ankle and foot: Secondary | ICD-10-CM

## 2020-11-26 NOTE — Progress Notes (Signed)
She presents today for follow-up of her [place] fasciitis left which she states is doing great but she states that her right foot for the medial longitudinal arch has collapsed is just not feeling any better.  She is also concerned about the hammertoe second.  She states that she is taking the anti-inflammatories, steroid and nonsteroidal wearing the brace and has even decreased her activity by decreasing her job schedule.  All to no avail she states that it really has not gotten any better at all.  She has tried some at home physical therapy.  Objective: Vital signs are stable she is alert and oriented x3.  Pulses are palpable.  She has a collapsed arch with the majority of her pain at the first metatarsal cuneiform articulation in the talonavicular articulation.  She also is getting pressure along the plantar aspect of the first metatarsal.  She also has pain on palpation of the posterior tibial tendon with fluctuance within the tendon sheath itself.  I reviewed previous radiographs demonstrating collapse of the medial longitudinal arch.  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  Talonavicular collapse.  Plan: Discussed etiology pathology conservative versus surgical therapies at this point I feel that MRI is necessary to gauge the degree of severity and surgical planning options for her.  I will follow-up with her in the near future for surgical intervention.

## 2020-11-27 DIAGNOSIS — G4733 Obstructive sleep apnea (adult) (pediatric): Secondary | ICD-10-CM | POA: Diagnosis not present

## 2020-11-28 NOTE — Patient Instructions (Signed)
DUE TO COVID-19 ONLY ONE VISITOR IS ALLOWED TO COME WITH YOU AND STAY IN THE WAITING ROOM ONLY DURING PRE OP AND PROCEDURE DAY OF SURGERY. THE 1 VISITOR  MAY VISIT WITH YOU AFTER SURGERY IN YOUR PRIVATE ROOM DURING VISITING HOURS ONLY!               Kathy Long   Your procedure is scheduled on: 12/04/20   Report to [facility] Main  Entrance   Report to admitting at: 7:30 AM     Call this number if you have problems the morning of surgery [phone]    Remember: Do not eat solid food :After Midnight. Clear liquids until: 6:30 am.  CLEAR LIQUID DIET  Foods Allowed                                                                     Foods Excluded  Coffee and tea, regular and decaf                             liquids that you cannot  Plain Jell-O any favor except red or purple                                           see through such as: Fruit ices (not with fruit pulp)                                     milk, soups, orange juice  Iced Popsicles                                    All solid food Carbonated beverages, regular and diet                                    Cranberry, grape and apple juices Sports drinks like Gatorade Lightly seasoned clear broth or consume(fat free) Sugar, honey syrup  Sample Menu Breakfast                                Lunch                                     Supper Cranberry juice                    Beef broth                            Chicken broth Jell-O                                     Grape  juice                           Apple juice Coffee or tea                        Jell-O                                      Popsicle                                                Coffee or tea                        Coffee or tea  _____________________________________________________________________  BRUSH YOUR TEETH MORNING OF SURGERY AND RINSE YOUR MOUTH OUT, NO CHEWING GUM CANDY OR MINTS.    Take these medicines the morning of surgery  with A SIP OF WATER: citalopram,metoprolol,amlodipine,omeprazole.                               You may not have any metal on your body including hair pins and              piercings  Do not wear jewelry, make-up, lotions, powders or perfumes, deodorant             Do not wear nail polish on your fingernails.  Do not shave  48 hours prior to surgery.    Do not bring valuables to the hospital. [place].  Contacts, dentures or bridgework may not be worn into surgery.  Leave suitcase in the car. After surgery it may be brought to your room.     Patients discharged the day of surgery will not be allowed to drive home. IF YOU ARE HAVING SURGERY AND GOING HOME THE SAME DAY, YOU MUST HAVE AN ADULT TO DRIVE YOU HOME AND BE WITH YOU FOR 24 HOURS. YOU MAY GO HOME BY TAXI OR UBER OR ORTHERWISE, BUT AN ADULT MUST ACCOMPANY YOU HOME AND STAY WITH YOU FOR 24 HOURS.  Name and phone number of your driver:  Special Instructions: N/A              Please read over the following fact sheets you were given: _____________________________________________________________________           [facility] - Preparing for Surgery Before surgery, you can play an important role.  Because skin is not sterile, your skin needs to be as free of germs as possible.  You can reduce the number of germs on your skin by washing with CHG (chlorahexidine gluconate) soap before surgery.  CHG is an antiseptic cleaner which kills germs and bonds with the skin to continue killing germs even after washing. Please DO NOT use if you have an allergy to CHG or antibacterial soaps.  If your skin becomes reddened/irritated stop using the CHG and inform your nurse when you arrive at Short Stay. Do not shave (including legs and underarms) for at least 48 hours prior to the first CHG shower.  You may shave your face/neck. Please follow these instructions carefully:  1.  Shower with CHG Soap the night  before surgery and the  morning of Surgery.  2.  If you choose to wash your hair, wash your hair first as usual with your  normal  shampoo.  3.  After you shampoo, rinse your hair and body thoroughly to remove the  shampoo.                           4.  Use CHG as you would any other liquid soap.  You can apply chg directly  to the skin and wash                       Gently with a scrungie or clean washcloth.  5.  Apply the CHG Soap to your body ONLY FROM THE NECK DOWN.   Do not use on face/ open                           Wound or open sores. Avoid contact with eyes, ears mouth and genitals (private parts).                       Wash face,  Genitals (private parts) with your normal soap.             6.  Wash thoroughly, paying special attention to the area where your surgery  will be performed.  7.  Thoroughly rinse your body with warm water from the neck down.  8.  DO NOT shower/wash with your normal soap after using and rinsing off  the CHG Soap.                9.  Pat yourself dry with a clean towel.            10.  Wear clean pajamas.            11.  Place clean sheets on your bed the night of your first shower and do not  sleep with pets. Day of Surgery : Do not apply any lotions/deodorants the morning of surgery.  Please wear clean clothes to the hospital/surgery center.  FAILURE TO FOLLOW THESE INSTRUCTIONS MAY RESULT IN THE CANCELLATION OF YOUR SURGERY PATIENT SIGNATURE_________________________________  NURSE SIGNATURE__________________________________  ________________________________________________________________________

## 2020-12-02 ENCOUNTER — Encounter (HOSPITAL_COMMUNITY)
Admission: RE | Admit: 2020-12-02 | Discharge: 2020-12-02 | Disposition: A | Discharge status: Home / Self Care | Payer: Medicare PPO | Source: Ambulatory Visit | Attending: Surgery | Admitting: Surgery

## 2020-12-02 ENCOUNTER — Encounter (HOSPITAL_COMMUNITY): Payer: Self-pay

## 2020-12-02 ENCOUNTER — Other Ambulatory Visit: Payer: Self-pay

## 2020-12-02 DIAGNOSIS — Z01818 Encounter for other preprocedural examination: Secondary | ICD-10-CM | POA: Insufficient documentation

## 2020-12-02 LAB — BASIC METABOLIC PANEL
Anion gap: 8 (ref 5–15)
BUN: 35 mg/dL — ABNORMAL HIGH (ref 8–23)
CO2: 27 mmol/L (ref 22–32)
Calcium: 9.3 mg/dL (ref 8.9–10.3)
Chloride: 105 mmol/L (ref 98–111)
Creatinine, Ser: 1.18 mg/dL — ABNORMAL HIGH (ref 0.44–1.00)
GFR, Estimated: 49 mL/min — ABNORMAL LOW (ref >60)
Glucose, Bld: 96 mg/dL (ref 70–99)
Potassium: 3.8 mmol/L (ref 3.5–5.1)
Sodium: 140 mmol/L (ref 135–145)

## 2020-12-02 LAB — CBC
HCT: 41.7 % (ref 36.0–46.0)
Hemoglobin: 13.7 g/dL (ref 12.0–15.0)
MCH: 31.7 pg (ref 26.0–34.0)
MCHC: 32.9 g/dL (ref 30.0–36.0)
MCV: 96.5 fL (ref 80.0–100.0)
Platelets: 217 10*3/uL (ref 150–400)
RBC: 4.32 MIL/uL (ref 3.87–5.11)
RDW: 13.1 % (ref 11.5–15.5)
WBC: 6.4 10*3/uL (ref 4.0–10.5)
nRBC: 0 % (ref 0.0–0.2)

## 2020-12-02 NOTE — Progress Notes (Signed)
COVID Vaccine Completed: Yes Date COVID Vaccine completed: 08/13/19 COVID vaccine manufacturer:    Moderna     PCP -Dr. Deborra Medina  Cardiologist -   Chest x-ray -  EKG -  Stress Test -  ECHO -  Cardiac Cath -  Pacemaker/ICD device last checked:  Sleep Study - Yes CPAP - Yes  Fasting Blood Sugar -  Checks Blood Sugar _____ times a day  Blood Thinner Instructions: Aspirin Instructions: Last Dose:  Anesthesia review: Hx: HTN,OSA(CPAP)  Patient denies shortness of breath, fever, cough and chest pain at PAT appointment   Patient verbalized understanding of instructions that were given to them at the PAT appointment. Patient was also instructed that they will need to review over the PAT instructions again at home before surgery.

## 2020-12-02 NOTE — H&P (Signed)
General Surgery [facility]  Kathy Long DOB: Dec 07, 1950 Married / Language: English / Race: White Female   History of Present Illness   The patient is a 70 year old female who presents with an incisional hernia.  CHIEF COMPLAINT: incisional hernia in epigastrium  Patient is self-referred.  Patient has an incisional hernia in the epigastrium related to a laparoscopic appendectomy procedure performed in 2009 in [place], [place].  Patient subsequently developed a hernia which is gradually become larger.  It does cause her some discomfort particularly following meals.  She denies any signs or symptoms of intestinal obstruction.  She has had a previous laparoscopic cholecystectomy which was complicated by a bile leak.  She has had no other abdominal surgery.  She has had no prior hernia repairs.  Patient presents today to discuss possible repair in the near future.   Past Surgical History  Appendectomy   Cataract Surgery   Bilateral. Foot Surgery   Bilateral. Gallbladder Surgery - Open   Sentinel Lymph Node Biopsy   Tonsillectomy    Diagnostic Studies History  Colonoscopy   within last year Mammogram   within last year Pap Smear   >5 years ago  Allergies  Morphine Sulfate (Bulk) *ANALGESICS - OPIOID*   Anaphylaxis. Adheres Incontinence Pad *MEDICAL DEVICES AND SUPPLIES*   Hives, Itching.  Medication History  amLODIPine Besylate  (5MG  Tablet, Oral) Active. Citalopram Hydrobromide  (20MG  Tablet, Oral) Active. Clobetasol Propionate  (0.05% Solution, External) Active. Fluzone High-Dose Quadrivalent  (0.7ML Susp Pref Syr, Intramuscular) Active. Gabapentin  (100MG  Capsule, Oral) Active. hydroCHLOROthiazide  (25MG  Tablet, Oral) Active. Meloxicam  (15MG  Tablet, Oral) Active. Metoprolol Succinate ER  (25MG  Tablet ER 24HR, Oral) Active. Omeprazole  (40MG  Capsule DR, Oral) Active. Rosuvastatin Calcium  (10MG  Tablet, Oral) Active. tiZANidine HCl  (2MG   Tablet, Oral) Active. methylPREDNISolone  (4MG  Tablet, Oral) Active. Meloxicam  (7.5MG  Tablet, Oral) Active. Medications Reconciled   Social History Alcohol use   Occasional alcohol use. Caffeine use   Carbonated beverages, Coffee, Tea. No drug use   Tobacco use   Never smoker.  Family History  Arthritis   Mother, Sister. Cerebrovascular Accident   Mother. Depression   Mother, Sister. Heart Disease   Father. Heart disease in female family member before age 4   Hypertension   Father, Mother, Sister. Kidney Disease   Father. Melanoma   Sister. Ovarian Cancer   Sister.  Pregnancy / Birth History  Age at menarche   31 years. Age of menopause   43-50 Contraceptive History   Oral contraceptives. Gravida   3 Irregular periods   Maternal age   35-25 Para   3  Other Problems  Arthritis   Back Pain   Gastroesophageal Reflux Disease   Hemorrhoids   High blood pressure   Hypercholesterolemia   Melanoma   Sleep Apnea   Ventral Hernia Repair    Review of Systems  General Present- Weight Gain. Not Present- Appetite Loss, Chills, Fatigue, Fever, Night Sweats and Weight Loss. Skin Present- Dryness. Not Present- Change in Wart/Mole, Hives, Jaundice, New Lesions, Non-Healing Wounds, Rash and Ulcer. HEENT Present- Hearing Loss. Not Present- Earache, Hoarseness, Nose Bleed, Oral Ulcers, Ringing in the Ears, Seasonal Allergies, Sinus Pain, Sore Throat, Visual Disturbances, Wears glasses/contact lenses and Yellow Eyes. Respiratory Present- Chronic Cough. Not Present- Bloody sputum, Difficulty Breathing, Snoring and Wheezing. Breast Not Present- Breast Mass, Breast Pain, Nipple Discharge and Skin Changes. Cardiovascular Not Present- Chest Pain, Difficulty Breathing Lying Down, Leg Cramps,  Palpitations, Rapid Heart Rate, Shortness of Breath and Swelling of Extremities. Gastrointestinal Present- Hemorrhoids. Not Present- Abdominal Pain, Bloating, Bloody Stool, Change in Bowel Habits, Chronic  diarrhea, Constipation, Difficulty Swallowing, Excessive gas, Gets full quickly at meals, Indigestion, Nausea, Rectal Pain and Vomiting. Female Genitourinary Present- Nocturia. Not Present- Frequency, Painful Urination, Pelvic Pain and Urgency. Musculoskeletal Present- Joint Pain and Joint Stiffness. Not Present- Back Pain, Muscle Pain, Muscle Weakness and Swelling of Extremities. Neurological Not Present- Decreased Memory, Fainting, Headaches, Numbness, Seizures, Tingling, Tremor, Trouble walking and Weakness. Psychiatric Not Present- Anxiety, Bipolar, Change in Sleep Pattern, Depression, Fearful and Frequent crying. Endocrine Not Present- Cold Intolerance, Excessive Hunger, Hair Changes, Heat Intolerance, Hot flashes and New Diabetes. Hematology Present- Easy Bruising. Not Present- Blood Thinners, Excessive bleeding, Gland problems, HIV and Persistent Infections.  Vitals  Weight: 177 lb   Height: 63 in  Body Surface Area: 1.84 m   Body Mass Index: 31.35 kg/m   Temp.: 97.3 F    Pulse: 66 (Regular)    P.OX: 99% (Room air) BP: 124/76(Sitting, Left Arm, Standard)  Physical Exam  GENERAL APPEARANCE Development: normal Nutritional status: normal Gross deformities: none  SKIN Rash, lesions, ulcers: none Induration, erythema: none Nodules: none palpable  EYES Conjunctiva and lids: normal Pupils: equal and reactive Iris: normal bilaterally  EARS, NOSE, MOUTH, THROAT External ears: no lesion or deformity External nose: no lesion or deformity Hearing: grossly normal Due to Covid-19 pandemic, patient is wearing a mask.  NECK Symmetric: yes Trachea: midline Thyroid: no palpable nodules in the thyroid bed  CHEST Respiratory effort: normal Retraction or accessory muscle use: no Breath sounds: normal bilaterally Rales, rhonchi, wheeze: none  CARDIOVASCULAR Auscultation: regular rhythm, normal rate Murmurs: none Pulses: radial pulse 2+ palpable Lower extremity edema:  none  ABDOMEN Distension: none Masses: none palpable Tenderness: none Hepatosplenomegaly: not present Hernia: Well-healed transverse surgical incision in the epigastrium. Beneath this incision is a palpable mass which augments with Valsalva and is partially reducible with manipulation consistent with an incisional hernia. With set-up maneuver, there is also a moderate rectus diastases.  MUSCULOSKELETAL Station and gait: normal Digits and nails: no clubbing or cyanosis Muscle strength: grossly normal all extremities Range of motion: grossly normal all extremities Deformity: none  LYMPHATIC Cervical: none palpable Supraclavicular: none palpable  PSYCHIATRIC Oriented to person, place, and time: yes Mood and affect: normal for situation Judgment and insight: appropriate for situation    Assessment & Plan   INCISIONAL HERNIA OF ANTERIOR ABDOMINAL WALL WITHOUT OBSTRUCTION OR GANGRENE (K43.2)  Patient presents today to discuss surgical repair of an incisional hernia in the upper mid abdominal wall.  Patient is provided with written literature on hernia surgery to review at home.  On examination, the patient does have an incisional hernia in the epigastrium.  This is partially reducible.  It augments with Valsalva.  Patient also has a mild to moderate rectus diastases.  We discussed operative repair.  I would recommend open repair using a mesh patch.  We discussed the use of prosthetic mesh.  We discussed the risk of recurrence being 5-10%.  We discussed restrictions on her activities after the surgery.  We would plan on this.  An outpatient surgical procedure, although her history of sleep apnea may require a period of monitoring at the hospital following the procedure.  Patient understands and wishes to proceed with surgery in the near future.  The risks and benefits of the procedure have been discussed at length with the patient.  The patient  understands the proposed procedure,  potential alternative treatments, and the course of recovery to be expected.  All of the patient's questions have been answered at this time.  The patient wishes to proceed with surgery.  Armandina Gemma, MD [facility] Office: [phone]

## 2020-12-03 ENCOUNTER — Ambulatory Visit: Payer: Medicare PPO | Admitting: Physical Therapy

## 2020-12-03 ENCOUNTER — Telehealth: Payer: Self-pay | Admitting: *Deleted

## 2020-12-03 NOTE — Telephone Encounter (Signed)
"  Dr. Milinda Pointer had said if I didn't her anything soon from the MRI scheduler to please give your office a call and see if you can expedite that.  I am however scheduled for surgery at 9:30 am in the morning at [facility] for a hernia.  So, it will probably be a week or a week and a half before I can get it done now.  I did call [facility] to see if I could get it scheduled for I couldn't reach anyone."

## 2020-12-04 ENCOUNTER — Ambulatory Visit (HOSPITAL_COMMUNITY): Payer: Medicare PPO | Admitting: Anesthesiology

## 2020-12-04 ENCOUNTER — Ambulatory Visit (HOSPITAL_COMMUNITY)
Admission: RE | Admit: 2020-12-04 | Discharge: 2020-12-04 | Disposition: A | Discharge status: Home / Self Care | Payer: Medicare PPO | Source: Ambulatory Visit | Attending: Surgery | Admitting: Surgery

## 2020-12-04 ENCOUNTER — Encounter (HOSPITAL_COMMUNITY)
Admission: RE | Disposition: A | Discharge status: Home / Self Care | Payer: Self-pay | Source: Ambulatory Visit | Attending: Surgery

## 2020-12-04 ENCOUNTER — Encounter (HOSPITAL_COMMUNITY): Payer: Self-pay | Admitting: Surgery

## 2020-12-04 DIAGNOSIS — K432 Incisional hernia without obstruction or gangrene: Secondary | ICD-10-CM | POA: Diagnosis not present

## 2020-12-04 DIAGNOSIS — Z885 Allergy status to narcotic agent status: Secondary | ICD-10-CM | POA: Insufficient documentation

## 2020-12-04 DIAGNOSIS — I129 Hypertensive chronic kidney disease with stage 1 through stage 4 chronic kidney disease, or unspecified chronic kidney disease: Secondary | ICD-10-CM | POA: Diagnosis not present

## 2020-12-04 DIAGNOSIS — N183 Chronic kidney disease, stage 3 unspecified: Secondary | ICD-10-CM | POA: Diagnosis not present

## 2020-12-04 DIAGNOSIS — E785 Hyperlipidemia, unspecified: Secondary | ICD-10-CM | POA: Diagnosis not present

## 2020-12-04 HISTORY — PX: INCISIONAL HERNIA REPAIR: SHX193

## 2020-12-04 SURGERY — REPAIR, HERNIA, INCISIONAL
Anesthesia: General

## 2020-12-04 MED ORDER — CHLORHEXIDINE GLUCONATE 0.12 % MT SOLN
15.0000 mL | Freq: Once | OROMUCOSAL | Status: AC
  Administered 2020-12-04: 15 mL via OROMUCOSAL

## 2020-12-04 MED ORDER — OXYCODONE HCL 5 MG/5ML PO SOLN: 5.0000 mg | Freq: Once | ORAL | Status: AC | PRN

## 2020-12-04 MED ORDER — ROCURONIUM BROMIDE 10 MG/ML (PF) SYRINGE
PREFILLED_SYRINGE | INTRAVENOUS | Status: DC | PRN
  Administered 2020-12-04: 70 mg via INTRAVENOUS

## 2020-12-04 MED ORDER — HYDROCODONE-ACETAMINOPHEN 5-325 MG PO TABS: 1.0000 | ORAL_TABLET | Freq: Four times a day (QID) | ORAL | 0 refills | Status: DC | PRN

## 2020-12-04 MED ORDER — OXYCODONE HCL 5 MG PO TABS
5.0000 mg | ORAL_TABLET | Freq: Once | ORAL | Status: AC | PRN
  Administered 2020-12-04: 5 mg via ORAL

## 2020-12-04 MED ORDER — FENTANYL CITRATE (PF) 250 MCG/5ML IJ SOLN
INTRAMUSCULAR | Status: DC | PRN
  Administered 2020-12-04: 100 ug via INTRAVENOUS

## 2020-12-04 MED ORDER — 0.9 % SODIUM CHLORIDE (POUR BTL) OPTIME
TOPICAL | Status: DC | PRN
  Administered 2020-12-04: 1000 mL

## 2020-12-04 MED ORDER — OXYCODONE HCL 5 MG PO TABS
ORAL_TABLET | ORAL | Status: AC
  Filled 2020-12-04: qty 1

## 2020-12-04 MED ORDER — SUGAMMADEX SODIUM 500 MG/5ML IV SOLN
INTRAVENOUS | Status: DC | PRN
  Administered 2020-12-04: 400 mg via INTRAVENOUS

## 2020-12-04 MED ORDER — DEXAMETHASONE SODIUM PHOSPHATE 10 MG/ML IJ SOLN
INTRAMUSCULAR | Status: AC
  Filled 2020-12-04: qty 1

## 2020-12-04 MED ORDER — LACTATED RINGERS IV SOLN: INTRAVENOUS | Status: DC

## 2020-12-04 MED ORDER — ONDANSETRON HCL 4 MG/2ML IJ SOLN: 4.0000 mg | Freq: Once | INTRAMUSCULAR | Status: DC | PRN

## 2020-12-04 MED ORDER — BUPIVACAINE HCL (PF) 0.5 % IJ SOLN
INTRAMUSCULAR | Status: DC | PRN
  Administered 2020-12-04: 30 mL

## 2020-12-04 MED ORDER — FENTANYL CITRATE (PF) 250 MCG/5ML IJ SOLN
INTRAMUSCULAR | Status: AC
  Filled 2020-12-04: qty 5

## 2020-12-04 MED ORDER — LIDOCAINE 2% (20 MG/ML) 5 ML SYRINGE
INTRAMUSCULAR | Status: AC
  Filled 2020-12-04: qty 5

## 2020-12-04 MED ORDER — FENTANYL CITRATE (PF) 100 MCG/2ML IJ SOLN: 25.0000 ug | INTRAMUSCULAR | Status: DC | PRN

## 2020-12-04 MED ORDER — ORAL CARE MOUTH RINSE: 15.0000 mL | Freq: Once | OROMUCOSAL | Status: AC

## 2020-12-04 MED ORDER — SUGAMMADEX SODIUM 500 MG/5ML IV SOLN
INTRAVENOUS | Status: AC
  Filled 2020-12-04: qty 10

## 2020-12-04 MED ORDER — MIDAZOLAM HCL 5 MG/5ML IJ SOLN
INTRAMUSCULAR | Status: DC | PRN
  Administered 2020-12-04: 2 mg via INTRAVENOUS

## 2020-12-04 MED ORDER — PROPOFOL 10 MG/ML IV BOLUS
INTRAVENOUS | Status: DC | PRN
  Administered 2020-12-04: 150 mg via INTRAVENOUS

## 2020-12-04 MED ORDER — MIDAZOLAM HCL 2 MG/2ML IJ SOLN
INTRAMUSCULAR | Status: AC
  Filled 2020-12-04: qty 2

## 2020-12-04 MED ORDER — ONDANSETRON HCL 4 MG/2ML IJ SOLN
INTRAMUSCULAR | Status: DC | PRN
  Administered 2020-12-04: 4 mg via INTRAVENOUS

## 2020-12-04 MED ORDER — CHLORHEXIDINE GLUCONATE CLOTH 2 % EX PADS: 6.0000 | MEDICATED_PAD | Freq: Once | CUTANEOUS | Status: DC

## 2020-12-04 MED ORDER — PROPOFOL 10 MG/ML IV BOLUS
INTRAVENOUS | Status: AC
  Filled 2020-12-04: qty 20

## 2020-12-04 MED ORDER — CEFAZOLIN SODIUM-DEXTROSE 2-4 GM/100ML-% IV SOLN
2.0000 g | INTRAVENOUS | Status: AC
  Administered 2020-12-04: 2 g via INTRAVENOUS
  Filled 2020-12-04: qty 100

## 2020-12-04 MED ORDER — LIDOCAINE 2% (20 MG/ML) 5 ML SYRINGE
INTRAMUSCULAR | Status: DC | PRN
  Administered 2020-12-04: 60 mg via INTRAVENOUS

## 2020-12-04 MED ORDER — ROCURONIUM BROMIDE 10 MG/ML (PF) SYRINGE
PREFILLED_SYRINGE | INTRAVENOUS | Status: AC
  Filled 2020-12-04: qty 10

## 2020-12-04 MED ORDER — AMISULPRIDE (ANTIEMETIC) 5 MG/2ML IV SOLN: 10.0000 mg | Freq: Once | INTRAVENOUS | Status: DC | PRN

## 2020-12-04 MED ORDER — DEXAMETHASONE SODIUM PHOSPHATE 10 MG/ML IJ SOLN
INTRAMUSCULAR | Status: DC | PRN
  Administered 2020-12-04: 5 mg via INTRAVENOUS

## 2020-12-04 MED ORDER — BUPIVACAINE HCL (PF) 0.5 % IJ SOLN
INTRAMUSCULAR | Status: AC
  Filled 2020-12-04: qty 30

## 2020-12-04 SURGICAL SUPPLY — 25 items
BAG COUNTER SPONGE SURGICOUNT (BAG) IMPLANT
BINDER ABDOMINAL 12 ML 46-62 (SOFTGOODS) IMPLANT
CHLORAPREP W/TINT 26 (MISCELLANEOUS) ×2 IMPLANT
COVER SURGICAL LIGHT HANDLE (MISCELLANEOUS) ×2 IMPLANT
DERMABOND ADVANCED (GAUZE/BANDAGES/DRESSINGS) ×1
DERMABOND ADVANCED .7 DNX12 (GAUZE/BANDAGES/DRESSINGS) ×1 IMPLANT
DRAPE LAPAROSCOPIC ABDOMINAL (DRAPES) ×2 IMPLANT
ELECT REM PT RETURN 15FT ADLT (MISCELLANEOUS) ×2 IMPLANT
GAUZE SPONGE 4X4 12PLY STRL (GAUZE/BANDAGES/DRESSINGS) ×2 IMPLANT
GLOVE SURG ORTHO LTX SZ8 (GLOVE) ×2 IMPLANT
GOWN STRL REUS W/TWL XL LVL3 (GOWN DISPOSABLE) ×4 IMPLANT
KIT BASIN OR (CUSTOM PROCEDURE TRAY) ×2 IMPLANT
KIT TURNOVER KIT A (KITS) ×2 IMPLANT
MESH VENTRALEX ST 1-7/10 CRC S (Mesh General) ×2 IMPLANT
NS IRRIG 1000ML POUR BTL (IV SOLUTION) ×2 IMPLANT
PACK GENERAL/GYN (CUSTOM PROCEDURE TRAY) ×2 IMPLANT
PENCIL SMOKE EVACUATOR (MISCELLANEOUS) IMPLANT
SPONGE T-LAP 18X18 ~~LOC~~+RFID (SPONGE) ×2 IMPLANT
SUT MNCRL AB 4-0 PS2 18 (SUTURE) ×2 IMPLANT
SUT NOVA 1 T20/GS 25DT (SUTURE) ×4 IMPLANT
SUT NOVA NAB GS-21 0 18 T12 DT (SUTURE) IMPLANT
SUT VIC AB 2-0 CT2 27 (SUTURE) ×2 IMPLANT
SUT VIC AB 3-0 SH 8-18 (SUTURE) ×2 IMPLANT
TOWEL OR 17X26 10 PK STRL BLUE (TOWEL DISPOSABLE) ×2 IMPLANT
TRAY FOLEY MTR SLVR 16FR STAT (SET/KITS/TRAYS/PACK) IMPLANT

## 2020-12-04 NOTE — Transfer of Care (Signed)
Immediate Anesthesia Transfer of Care Note  Patient: Kathy Long  Procedure(s) Performed: VENTRAL INCISIONAL HERNIA REPAIR WITH MESH  Patient Location: PACU  Anesthesia Type:General  Level of Consciousness: awake, alert  and oriented  Airway & Oxygen Therapy: Patient Spontanous Breathing and Patient connected to face mask oxygen  Post-op Assessment: Report given to RN and Post -op Vital signs reviewed and stable  Post vital signs: Reviewed and stable  Last Vitals:  Vitals Value Taken Time  BP 143/83 12/04/20 1007  Temp    Pulse 50 12/04/20 1010  Resp 17 12/04/20 1010  SpO2 100 % 12/04/20 1010  Vitals shown include unvalidated device data.  Last Pain:  Vitals:   12/04/20 0800  TempSrc: Oral  PainSc:          Complications: No notable events documented.

## 2020-12-04 NOTE — Discharge Instructions (Addendum)
[facility]  HERNIA REPAIR POST OP INSTRUCTIONS  Always review your discharge instruction sheet given to you by the facility where your surgery was performed.  A  prescription for pain medication may be given to you upon discharge.  Take your pain medication as prescribed.  If narcotic pain medicine is not needed, then you may take acetaminophen (Tylenol) or ibuprofen (Advil) as needed.  Take your usually prescribed medications unless otherwise directed.  If you need a refill on your pain medication, please contact your pharmacy.  They will contact our office to request authorization. Prescriptions will not be filled after 5 pm daily or on weekends.  You should follow a light diet the first 24 hours after arrival home, such as soup and crackers or toast.  Be sure to include plenty of fluids daily.  Resume your normal diet the day after surgery.  Most patients will experience some swelling and bruising around the surgical site.  Ice packs and reclining will help.  Swelling and bruising can take several days to resolve.   It is common to experience some constipation if taking pain medication after surgery.  Increasing fluid intake and taking a stool softener (such as Colace) will usually help or prevent this problem from occurring.  A mild laxative (Milk of Magnesia or Miralax) should be taken according to package directions if there are no bowel movements after 48 hours.  You will likely have Dermabond (topical glue) over your incisions.  This seals the incisions and allows you to bathe and shower at any time after your surgery.  Glue should remain in place for up to 10 days.  It may be removed after 10 days by pealing off the Dermabond material or using Vaseline or naval jelly to remove.  If you have steri-strips over your incisions, you may remove the gauze bandage on the second day after surgery, and you may shower at that time.  Leave your steri-strips (small skin tapes) in  place directly over the incision.  These strips should remain on the skin for 5-7 days and then be removed.  You may get them wet in the shower and pat them dry.  ACTIVITIES:  You may resume regular (light) daily activities beginning the next day - such as daily self-care, walking, climbing stairs - gradually increasing activities as tolerated.  You may have sexual intercourse when it is comfortable.  Refrain from any heavy lifting or straining until approved by your doctor.  You may drive when you are no longer taking prescription pain medication, when you can comfortably wear a seatbelt, and when you can safely maneuver your car and apply brakes.  You should see your doctor in the office for a follow-up appointment approximately 2-3 weeks after your surgery.  Make sure that you call for this appointment within a day or two after you arrive home to insure a convenient appointment time.  WHEN TO CALL YOUR DOCTOR: Fever greater than 101.0 Inability to urinate Persistent nausea and/or vomiting Extreme swelling or bruising Continued bleeding from incision Increased pain, redness, or drainage from the incision  The clinic staff is available to answer your questions during regular business hours.  Please don't hesitate to call and ask to speak to one of the nurses for clinical concerns.  If you have a medical emergency, go to the nearest emergency room or call 911.  A surgeon from [facility] is always on call for the hospital.   [facility] [phone]  [address], [place], [place], [place]  [address]  [phone] ? [phone] ? FAX [phone]  [email or web address]

## 2020-12-04 NOTE — Anesthesia Preprocedure Evaluation (Signed)
Anesthesia Evaluation  Patient identified by MRN, date of birth, ID band Patient awake    Reviewed: Allergy & Precautions, NPO status , Patient's Chart, lab work & pertinent test results, reviewed documented beta blocker date and time   History of Anesthesia Complications Negative for: history of anesthetic complications  Airway Mallampati: III  TM Distance: >3 FB Neck ROM: Full    Dental  (+) Dental Advisory Given, Teeth Intact   Pulmonary sleep apnea ,    Pulmonary exam normal        Cardiovascular hypertension, Pt. on medications and Pt. on home beta blockers Normal cardiovascular exam     Neuro/Psych Anxiety Depression L acoustic neuroma    GI/Hepatic Neg liver ROS, GERD  ,  Endo/Other  negative endocrine ROS  Renal/GU Renal InsufficiencyRenal disease (CKD3, Cr 1.18)  negative genitourinary   Musculoskeletal  (+) Arthritis ,   Abdominal   Peds  Hematology negative hematology ROS (+)   Anesthesia Other Findings   Reproductive/Obstetrics                            Anesthesia Physical Anesthesia Plan  ASA: 2  Anesthesia Plan: General   Post-op Pain Management:    Induction: Intravenous  PONV Risk Score and Plan: 4 or greater and Ondansetron, Dexamethasone, Treatment may vary due to age or medical condition and Midazolam  Airway Management Planned: Oral ETT  Additional Equipment: None  Intra-op Plan:   Post-operative Plan: Extubation in OR  Informed Consent: I have reviewed the patients History and Physical, chart, labs and discussed the procedure including the risks, benefits and alternatives for the proposed anesthesia with the patient or authorized representative who has indicated his/her understanding and acceptance.     Dental advisory given  Plan Discussed with:   Anesthesia Plan Comments:         Anesthesia Quick Evaluation

## 2020-12-04 NOTE — Interval H&P Note (Signed)
History and Physical Interval Note:  12/04/2020 8:47 AM  Kathy Long  has presented today for surgery, with the diagnosis of VENTRAL INCISIONAL HERNIA.  The various methods of treatment have been discussed with the patient and family. After consideration of risks, benefits and other options for treatment, the patient has consented to    Procedure(s): Caroline (N/A) as a surgical intervention.    The patient's history has been reviewed, patient examined, no change in status, stable for surgery.  I have reviewed the patient's chart and labs.  Questions were answered to the patient's satisfaction.    Armandina Gemma, MD [facility] Office: [place]

## 2020-12-04 NOTE — Op Note (Signed)
Operative Note  Pre-operative Diagnosis: ventral incisional hernia  Post-operative Diagnosis:  same  Surgeon:  Armandina Gemma, MD  Assistant:  none   Procedure:  open repair ventral incisional hernia with mesh patch  Anesthesia:  general  Estimated Blood Loss:  minimal  Drains: none         Specimen: none  Indications:  Patient has an incisional hernia in the epigastrium related to a laparoscopic appendectomy procedure performed in 2009 in [place], [place].  Patient subsequently developed a hernia which is gradually become larger.  It does cause her some discomfort particularly following meals.  She denies any signs or symptoms of intestinal obstruction.  She has had a previous laparoscopic cholecystectomy which was complicated by a bile leak.  She has had no other abdominal surgery.  She has had no prior hernia repairs.  Patient presents today for operative repair.  Procedure:  The patient was seen in the pre-op holding area. The risks, benefits, complications, treatment options, and expected outcomes were previously discussed with the patient. The patient agreed with the proposed plan and has signed the informed consent form.  The patient was brought to the operating room by the surgical team, identified as Kathy Long and the procedure verified. A "time out" was completed and the above information confirmed.  Following induction of general anesthesia, the patient is prepped and draped in the usual aseptic fashion.  Previous incision is reopened with a #15 blade.  Dissection is carried through subcutaneous tissues.  A hernia sac is identified containing adipose tissue.  It is mobilized down to the fascia and reduced.  Fascial edges are defined circumferentially.  Using digital blunt dissection a plane is created in the preperitoneal space.  A Ventralex ST 4.3 cm mesh patch is selected and prepared.  It is inserted into the preperitoneal space and deployed circumferentially.  The  fascial defect is closed with interrupted 0 Novafil simple sutures incorporating the anterior portion of the mesh patch into the closure.  Fascia is closed without undue tension.  Local anesthetic is infiltrated around the repair.  Subcutaneous tissues are closed with interrupted 3-0 Vicryl sutures.  Skin is anesthetized with local anesthetic.  Skin edges are reapproximated with a running 4-0 Monocryl subcuticular suture.  Wound is washed and dried and Dermabond is applied as dressing.  Patient is awakened from anesthesia and transported to the recovery room.  The patient tolerated the procedure well.   Armandina Gemma, MD [facility] Office: [phone]

## 2020-12-04 NOTE — Anesthesia Procedure Notes (Signed)
Procedure Name: Intubation Date/Time: 12/04/2020 9:16 AM Performed by: Lollie Sails, CRNA Pre-anesthesia Checklist: Patient identified, Emergency Drugs available, Suction available, Patient being monitored and Timeout performed Patient Re-evaluated:Patient Re-evaluated prior to induction Oxygen Delivery Method: Circle system utilized Preoxygenation: Pre-oxygenation with 100% oxygen Induction Type: IV induction Ventilation: Mask ventilation without difficulty Laryngoscope Size: Miller and 3 Grade View: Grade II Tube type: Oral Number of attempts: 1 Airway Equipment and Method: Stylet Placement Confirmation: ETT inserted through vocal cords under direct vision, positive ETCO2 and breath sounds checked- equal and bilateral Secured at: 23 cm Tube secured with: Tape Dental Injury: Teeth and Oropharynx as per pre-operative assessment

## 2020-12-04 NOTE — Anesthesia Postprocedure Evaluation (Signed)
Anesthesia Post Note  Patient: Kathy Long  Procedure(s) Performed: VENTRAL INCISIONAL HERNIA REPAIR WITH MESH     Patient location during evaluation: PACU Anesthesia Type: General Level of consciousness: awake and alert Pain management: pain level controlled Vital Signs Assessment: post-procedure vital signs reviewed and stable Respiratory status: spontaneous breathing, nonlabored ventilation and respiratory function stable Cardiovascular status: blood pressure returned to baseline and stable Postop Assessment: no apparent nausea or vomiting Anesthetic complications: no   No notable events documented.  Last Vitals:  Vitals:   12/04/20 1045 12/04/20 1057  BP: 136/73 (!) 163/79  Pulse: (!) 46 (!) 50  Resp: 14 18  Temp:  36.7 C  SpO2: 94% 93%    Last Pain:  Vitals:   12/04/20 1057  TempSrc: Oral  PainSc: 2                  Lidia Collum

## 2020-12-07 ENCOUNTER — Encounter (HOSPITAL_COMMUNITY): Payer: Self-pay | Admitting: Surgery

## 2020-12-08 ENCOUNTER — Encounter: Payer: Medicare PPO | Admitting: Physical Therapy

## 2020-12-08 NOTE — Telephone Encounter (Signed)
I called [facility] to check on the status of the MRI appointment.  I was informed by Karena Addison that the insurance company denied the MRI so [facility] is still trying to get it authorized.  She said they will call the patient once it's authorized to schedule an appointment.  I called [facility] and they said the MRI was denied by your insurance.  They are still trying to get it authorized.  Once it's authorized, they will give you a call.  "So it will be done at [place].  Why in the world would they deny it?"  I am not sure.  "Okay, thanks for checking on it for me."

## 2020-12-10 ENCOUNTER — Encounter: Payer: Medicare PPO | Admitting: Physical Therapy

## 2020-12-12 ENCOUNTER — Other Ambulatory Visit: Payer: Self-pay

## 2020-12-12 ENCOUNTER — Ambulatory Visit
Admission: RE | Admit: 2020-12-12 | Discharge: 2020-12-12 | Disposition: A | Discharge status: Home / Self Care | Payer: Medicare PPO | Source: Ambulatory Visit | Attending: Podiatry | Admitting: Podiatry

## 2020-12-12 DIAGNOSIS — M19279 Secondary osteoarthritis, unspecified ankle and foot: Secondary | ICD-10-CM

## 2020-12-12 DIAGNOSIS — M66871 Spontaneous rupture of other tendons, right ankle and foot: Secondary | ICD-10-CM

## 2020-12-12 DIAGNOSIS — M19071 Primary osteoarthritis, right ankle and foot: Secondary | ICD-10-CM | POA: Diagnosis not present

## 2020-12-12 DIAGNOSIS — S86811A Strain of other muscle(s) and tendon(s) at lower leg level, right leg, initial encounter: Secondary | ICD-10-CM | POA: Diagnosis not present

## 2020-12-12 DIAGNOSIS — M76821 Posterior tibial tendinitis, right leg: Secondary | ICD-10-CM

## 2020-12-15 ENCOUNTER — Encounter: Payer: Medicare PPO | Admitting: Physical Therapy

## 2020-12-16 DIAGNOSIS — G4733 Obstructive sleep apnea (adult) (pediatric): Secondary | ICD-10-CM

## 2020-12-17 ENCOUNTER — Encounter: Payer: Medicare PPO | Admitting: Physical Therapy

## 2020-12-22 ENCOUNTER — Encounter: Payer: Medicare PPO | Admitting: Physical Therapy

## 2020-12-23 ENCOUNTER — Other Ambulatory Visit: Payer: Self-pay | Admitting: Internal Medicine

## 2020-12-24 ENCOUNTER — Ambulatory Visit: Payer: Medicare PPO | Admitting: Podiatry

## 2020-12-24 ENCOUNTER — Other Ambulatory Visit: Payer: Self-pay

## 2020-12-24 ENCOUNTER — Encounter: Payer: Self-pay | Admitting: Podiatry

## 2020-12-24 ENCOUNTER — Ambulatory Visit: Payer: Medicare PPO

## 2020-12-24 ENCOUNTER — Encounter: Payer: Medicare PPO | Admitting: Physical Therapy

## 2020-12-24 DIAGNOSIS — M76821 Posterior tibial tendinitis, right leg: Secondary | ICD-10-CM

## 2020-12-24 NOTE — Progress Notes (Signed)
She presents today for follow-up of her MRI.  States that she still having pain right and here she refers to the plantar medial aspect of the first tarsometatarsal joint.  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  Does demonstrate some posterior tibial tendon tear which is mildly symptomatic.  It does not comment on any plantar fibromas which there feels to be 1 present along the medial band of the plantar fascia just beneath the first tarsometatarsal joint.  Assessment: I feel that we would best benefit from an over read.  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  Plan: Requesting an over read.  Also she will follow-up with me in the near future to discuss surgical intervention consisting of a repair of the posterior tibial tendon possibly considering a repair of the subtalar joint and talonavicular joint with her flatfoot.  She is also considered hallux valgus repair such as a McBride to release the lateral aspect of the joint and a hammertoe repair to the second toe.  She states that she is unsteady would not be able to use crutches nor would she be able to use a knee scooter.  She does have the ability to utilize a device that she can order that would help offload her right leg.

## 2020-12-26 DIAGNOSIS — M79676 Pain in unspecified toe(s): Secondary | ICD-10-CM

## 2020-12-27 DIAGNOSIS — G4733 Obstructive sleep apnea (adult) (pediatric): Secondary | ICD-10-CM | POA: Diagnosis not present

## 2020-12-29 ENCOUNTER — Encounter: Payer: Medicare PPO | Admitting: Physical Therapy

## 2020-12-30 ENCOUNTER — Ambulatory Visit: Payer: Medicare PPO

## 2020-12-31 ENCOUNTER — Encounter: Payer: Medicare PPO | Admitting: Physical Therapy

## 2021-01-02 ENCOUNTER — Telehealth: Payer: Self-pay | Admitting: *Deleted

## 2021-01-02 ENCOUNTER — Encounter: Payer: Self-pay | Admitting: *Deleted

## 2021-01-02 NOTE — Telephone Encounter (Signed)
"  I need an extension on my leave from work  I'm on leave from [employer].  My leave is scheduled to end on 01/11/2021.  Dr. Milinda Pointer told me last week that I needed surgery. I'm looking at having surgery in September around the 14.  So I need to be out of work until then.  It's leave # [id number].  I need additional paperwork submitted.  The communication should be from [place], integrated claims examiner fom [employer].  Do you have any paperwork to fill out from them?  Give me a call."  I returned her call and sent her a letter via MyChart requesting an extension of her medical leave to 02/27/2021.  I explained in the letter that if there isn't any improvement with her foot she could possibly need surgery and her extension would be increased further.  She stated she would send it to [employer] or her employer.

## 2021-01-05 ENCOUNTER — Encounter: Payer: Medicare PPO | Admitting: Physical Therapy

## 2021-01-07 ENCOUNTER — Encounter: Payer: Medicare PPO | Admitting: Physical Therapy

## 2021-01-12 ENCOUNTER — Ambulatory Visit: Payer: Medicare PPO | Admitting: Podiatry

## 2021-01-12 ENCOUNTER — Encounter: Payer: Medicare PPO | Admitting: Physical Therapy

## 2021-01-14 ENCOUNTER — Encounter: Payer: Medicare PPO | Admitting: Physical Therapy

## 2021-01-19 ENCOUNTER — Encounter: Payer: Medicare PPO | Admitting: Physical Therapy

## 2021-01-21 ENCOUNTER — Encounter: Payer: Self-pay | Admitting: Podiatry

## 2021-01-21 ENCOUNTER — Telehealth: Payer: Self-pay

## 2021-01-21 ENCOUNTER — Encounter: Payer: Medicare PPO | Admitting: Physical Therapy

## 2021-01-21 NOTE — Telephone Encounter (Signed)
Patient called today requesting status of MRI overread and discuss surgery.   Called overread service, they have faxed to [place] office and results will be sent to Dr. Milinda Pointer for review.  I informed patient via voice mail that someone will call her once overread was reviewed.

## 2021-01-23 ENCOUNTER — Other Ambulatory Visit: Payer: Self-pay | Admitting: Internal Medicine

## 2021-01-26 ENCOUNTER — Ambulatory Visit: Payer: Medicare PPO | Admitting: Podiatry

## 2021-01-26 ENCOUNTER — Encounter: Payer: Medicare PPO | Admitting: Physical Therapy

## 2021-01-27 DIAGNOSIS — G4733 Obstructive sleep apnea (adult) (pediatric): Secondary | ICD-10-CM | POA: Diagnosis not present

## 2021-01-28 ENCOUNTER — Encounter: Payer: Self-pay | Admitting: Podiatry

## 2021-01-28 ENCOUNTER — Encounter: Payer: Medicare PPO | Admitting: Physical Therapy

## 2021-01-28 ENCOUNTER — Ambulatory Visit (INDEPENDENT_AMBULATORY_CARE_PROVIDER_SITE_OTHER): Payer: Medicare PPO

## 2021-01-28 ENCOUNTER — Ambulatory Visit: Payer: Medicare PPO | Admitting: Podiatry

## 2021-01-28 ENCOUNTER — Other Ambulatory Visit: Payer: Self-pay

## 2021-01-28 DIAGNOSIS — Q666 Other congenital valgus deformities of feet: Secondary | ICD-10-CM

## 2021-01-28 DIAGNOSIS — M722 Plantar fascial fibromatosis: Secondary | ICD-10-CM

## 2021-01-28 DIAGNOSIS — M85871 Other specified disorders of bone density and structure, right ankle and foot: Secondary | ICD-10-CM | POA: Diagnosis not present

## 2021-01-28 MED ORDER — TRIAMCINOLONE ACETONIDE 40 MG/ML IJ SUSP
20.0000 mg | Freq: Once | INTRAMUSCULAR | Status: AC
  Administered 2021-01-28: 20 mg

## 2021-01-28 NOTE — Progress Notes (Signed)
She presents today to meet with Dr. Sherryle Lis and myself regarding her posterior tibial tendon tear and her pain along the plantar medial aspect of the foot.  She states that nothing has changed and she would like to have this done as soon as possible.  However she would like [place] and be fairly mobile by that point.  Objective: Vital signs are stable alert and oriented x3.  We have reviewed her past medical history medications allergies surgeries and her social history.  At this point she was examined by both Dr. Sherryle Lis and myself today noting that she has severe pes planovalgus complete disruption of the posterior tibial tendon on exam and per MRI, near to complete failure of her TN joint and pain along the first metatarsal medial cuneiform joint plantarly.  She also demonstrates hallux valgus.  She also has a painful lesion or bone along the medial aspect of the PIPJ second toe.  Dr. Sherryle Lis on myself reviewed radiographs taken today demonstrating a valgus calcaneal axial view with midfoot collapse.  Assessment: Pes planovalgus TN collapse bunion deformity hammertoe deformity.  Posterior tibial tendon tear.  Plan: We discussed her plan for surgical intervention consisting of a subtalar joint fusion as well as a talonavicular joint fusion tenodesis of the flexor hallucis longus and a Lapidus procedure to plantarflex the forefoot and correct the bunion at the same time.  Also correct the hammertoe.  At this point due to her family plans we will wait until after the first of the year for surgical intervention.  However we will go ahead and check her vitamin D level and her calcium levels.  She will follow-up with Dr. Sherryle Lis in the near future for surgical evaluation and consent.

## 2021-02-04 ENCOUNTER — Telehealth: Payer: Self-pay | Admitting: Internal Medicine

## 2021-02-04 NOTE — Telephone Encounter (Signed)
I have called and LM on VM for the pt to call us back.  

## 2021-02-04 NOTE — Telephone Encounter (Signed)
Lm for patient to confirm if she is wearing cpap.

## 2021-02-05 ENCOUNTER — Other Ambulatory Visit: Payer: Self-pay

## 2021-02-05 ENCOUNTER — Encounter: Payer: Self-pay | Admitting: Internal Medicine

## 2021-02-05 ENCOUNTER — Ambulatory Visit: Payer: Medicare PPO | Admitting: Internal Medicine

## 2021-02-05 VITALS — BP 120/82 | HR 54 | Temp 98.3°F | Ht 63.0 in | Wt 178.8 lb

## 2021-02-05 DIAGNOSIS — G4733 Obstructive sleep apnea (adult) (pediatric): Secondary | ICD-10-CM

## 2021-02-05 DIAGNOSIS — M85871 Other specified disorders of bone density and structure, right ankle and foot: Secondary | ICD-10-CM | POA: Diagnosis not present

## 2021-02-05 NOTE — Patient Instructions (Signed)
Referral to ENT for INSPIRE DEVICE

## 2021-02-05 NOTE — Progress Notes (Signed)
[facility] Consultation         CC Follow up after 3 years H/p pleural effusion   HPI:   Year 2000 She has a history of melanoma, only required resection, and did not require chemo/rads.   Patient has a history of snoring, which resolved after palatectomy/uvulectomy.   FEB 2019  Patient was admitted to the hospital for abdominal pain.  At that time she was found to have a bile leak on HIDA, scanning,complicating a cholecystectomy.  She underwent ERCP on 07/12/17 with sphincterotomy and stenting of the common bile duct.    MAR 2019  She went to see her ENT doctor on 08/17/17, and was noted to have a persistent dry hacking cough.  Patient underwent a flexible laryngoscopy at that time which was unremarkable.  It was thought that the patient's cough may be secondary to right pleural effusion noted on CXR.  She was also noted to have an acoustic neuroma, an MRI was ordered. right thoracentesis  Yielded 1.6 liters of hazy yellow fluid.  Review of the fluid testing suggestive of an exudative fluid, predominantly lymphocytic with normal pH, culture and cytology were negative.  This suggests a reactive effusion. After the thora the patient noted that she felt much better and her cough was much better.   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   Anxiety    Arthritis    lower back, hands   Cancer ([place])    melanoma, shoulder   Chronic kidney disease    Depression    GERD (gastroesophageal reflux disease)    Hyperlipidemia    Hypertension     Sleep apnea    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    Surgical Hx:  Past Surgical History:  Procedure Laterality Date   ANKLE FRACTURE SURGERY Left    APPENDECTOMY  2010   BACK SURGERY     disectomy lumbar, scar tissue   BREAST CYST EXCISION Right 1970   axilla   buninectomy Bilateral    CATARACT EXTRACTION W/PHACO Left 04/12/2018   Procedure: CATARACT EXTRACTION PHACO AND INTRAOCULAR LENS PLACEMENT ([place])  LEFT TORIC LENS;  Surgeon: Leandrew Koyanagi, MD;  Location: [facility];  Service: Ophthalmology;  Laterality: Left;   CATARACT EXTRACTION W/PHACO Right 05/10/2018   Procedure: CATARACT EXTRACTION PHACO AND INTRAOCULAR LENS PLACEMENT ([place])  RIGHT TORIC LENS;  Surgeon: Leandrew Koyanagi, MD;  Location: [facility];  Service: Ophthalmology;  Laterality: Right;  DIABETIC   CHOLECYSTECTOMY N/A 07/05/2017   Procedure: LAPAROSCOPIC CHOLECYSTECTOMY WITH INTRAOPERATIVE CHOLANGIOGRAM;  Surgeon: Robert Bellow, MD;  Location: [facility];  Service: General;  Laterality: N/A;   COLONOSCOPY     2009 and color gaurd in 2018   COLONOSCOPY N/A 03/25/2020   Procedure: COLONOSCOPY;  Surgeon: [name], MD;  Location: [facility];  Service: Endoscopy;  Laterality: N/A;   ERCP N/A 07/12/2017   Procedure: ENDOSCOPIC RETROGRADE CHOLANGIOPANCREATOGRAPHY (ERCP);  Surgeon: Lucilla Lame, MD;  Location: [facility];  Service: Endoscopy;  Laterality: N/A;  ERCP N/A 10/04/2017   Procedure: ENDOSCOPIC RETROGRADE CHOLANGIOPANCREATOGRAPHY (ERCP);  Surgeon: Lucilla Lame, MD;  Location: [facility] END[facility]COPY;  Service: Endoscopy;  Laterality: N/A;   FOOT SURGERY     x 2   INCISIONAL HERNIA REPAIR N/A 12/04/2020   Procedure: VENTRAL INCISIONAL HERNIA REPAIR WITH MESH;  Surgeon: Armandina Gemma, MD;  Location: [facility];  Service: General;  Laterality: N/A;   MELANOMA EXCISION Left 2000   PALATOPLASTY N/A 04/08/2015   Procedure: PALATOPLASTY;  Surgeon: Beverly Gust, MD;  Location: [facility];   Service: ENT;  Laterality: N/A;   TONSILLECTOMY N/A 04/08/2015   Procedure: TONSILLECTOMY;  Surgeon: Beverly Gust, MD;  Location: [facility];  Service: ENT;  Laterality: N/A;   TUBAL LIGATION     Family Hx:  Family History  Problem Relation Age of Onset   Cancer Sister 27       ovarian ca   Breast cancer Neg Hx    Social Hx:   Social History   Tobacco Use   Smoking status: Never   Smokeless tobacco: Never   Tobacco comments:    smoked socially in college  Vaping Use   Vaping Use: Never used  Substance Use Topics   Alcohol use: Yes    Alcohol/week: 0.0 standard drinks    Comment: occ - may have 1 glass wine/month   Drug use: No   Medication:    Current Outpatient Medications:    acetaminophen (TYLENOL) 325 MG tablet, Take 650 mg by mouth every 6 (six) hours as needed for moderate pain., Disp: , Rfl:    amLODipine (NORVASC) 5 MG tablet, TAKE 1 TABLET BY MOUTH DAILY, Disp: 90 tablet, Rfl: 1   Cholecalciferol (VITAMIN D3) 75 MCG (3000 UT) TABS, Take 6,000 Units by mouth daily., Disp: , Rfl:    citalopram (CELEXA) 20 MG tablet, TAKE 1 TABLET BY MOUTH DAILY, Disp: 90 tablet, Rfl: 1   clobetasol (TEMOVATE) 0.05 % external solution, APPLY DAILY UNTIL RASH IS CLEAR, Disp: 50 mL, Rfl: 0   Coenzyme Q10 100 MG capsule, Take 100 mg by mouth daily., Disp: , Rfl:    hydrochlorothiazide (HYDRODIURIL) 25 MG tablet, TAKE 1 TABLET BY MOUTH DAILY, Disp: 90 tablet, Rfl: 3   melatonin 5 MG TABS, Take 5 mg by mouth at bedtime., Disp: , Rfl:    metoprolol succinate (TOPROL-XL) 25 MG 24 hr tablet, TAKE 1 TABLET BY MOUTH WITH OR IMMEDIATLEY FOLLOWING A MEAL EVERY DAY, Disp: 90 tablet, Rfl: 1   omeprazole (PRILOSEC) 40 MG capsule, TAKE 1 CAPSULE BY MOUTH ONCE DAILY, Disp: 90 capsule, Rfl: 1   rosuvastatin (CRESTOR) 10 MG tablet, TAKE ONE TABLET BY MOUTH EVERY DAY, Disp: 90 tablet, Rfl: 1   vitamin C (ASCORBIC ACID) 500 MG tablet, Take 500 mg by mouth daily., Disp: , Rfl:    COLLAGEN PO, Take 2  capsules by mouth daily., Disp: , Rfl:    HYDROcodone-acetaminophen (NORCO/VICODIN) 5-325 MG tablet, Take 1-2 tablets by mouth every 6 (six) hours as needed for moderate pain., Disp: 15 tablet, Rfl: 0   meloxicam (MOBIC) 15 MG tablet, Take 1 tablet (15 mg total) by mouth daily., Disp: 30 tablet, Rfl: 3   methylPREDNISolone (MEDROL DOSEPAK) 4 MG TBPK tablet, 6 day dose pack - take as directed (Patient not taking: Reported on 11/24/2020), Disp: 21 tablet, Rfl: 0   tiZANidine (ZANAFLEX) 2 MG tablet, Take 1 tablet (2 mg total) by mouth at bedtime. (Patient not taking: Reported on 11/24/2020), Disp: 30 tablet, Rfl: 0  Allergies:  Morphine and related, Tramadol, and Tape   Review of Systems:  Gen:  Denies  fever, sweats, chills weight loss  HEENT: Denies blurred vision, double vision, ear pain, eye pain, hearing loss, nose bleeds, sore throat Cardiac:  No dizziness, chest pain or heaviness, chest tightness,edema, No JVD Resp:   No cough, -sputum production, -shortness of breath,-wheezing, -hemoptysis,  Gi: Denies swallowing difficulty, stomach pain, nausea or vomiting, diarrhea, constipation, bowel incontinence Gu:  Denies bladder incontinence, burning urine Ext:   Denies Joint pain, stiffness or swelling Skin: Denies  skin rash, easy bruising or bleeding or hives Endoc:  Denies polyuria, polydipsia , polyphagia or weight change Psych:   Denies depression, insomnia or hallucinations  Other:  All other systems negative   BP 120/82 (BP Location: Left Arm, Patient Position: Sitting, Cuff Size: Normal)   Pulse (!) 54   Temp 98.3 F (36.8 C) (Oral)   Ht '5\' 3"'$  (1.6 m)   Wt 178 lb 12.8 oz (81.1 kg)   SpO2 (!) 54%   BMI 31.67 kg/m    Physical Examination:   General Appearance: No distress  EYES PERRLA, EOM intact.   NECK Supple, No JVD Pulmonary: normal breath sounds, No wheezing.  CardiovascularNormal S1,S2.  No m/r/g.   Abdomen: Benign, Soft, non-tender. Skin:   warm, no rashes, no  ecchymosis  Extremities: normal, no cyanosis, clubbing. Neuro:without focal findings,  speech normal  PSYCHIATRIC: Mood, affect within normal limits.   ALL OTHER ROS ARE NEGATIVE        Assessment and Plan:   70 year old pleasant white female seen today for assessment for moderate to severe OSA  Patient recently started CPAP therapy however is having a very hard time using the masks  She has been fitted several times and is having headaches and neck stiffness and pain   Patient does have good compliance report 77% for days and 73% for greater than 4 hours which an AHI reduced to 6.7 Full facemask CPAP of 12 cm of water pressure   Patient asking about INSPIRE implantable device Will refer to ENT for referral Dr. [name] in [facility]    Patient  satisfied with Plan of action and management. All questions answered  Follow up 1 year  Total Time Spent  24 mins   Lashawnta Burgert Patricia Pesa, M.D.  [facility]  Medical Director [place] Director [facility] Cardio-Pulmonary Department

## 2021-02-06 LAB — CALCIUM: Calcium: 9.7 mg/dL (ref 8.7–10.3)

## 2021-02-06 LAB — VITAMIN D 25 HYDROXY (VIT D DEFICIENCY, FRACTURES): Vit D, 25-Hydroxy: 64.9 ng/mL (ref 30.0–100.0)

## 2021-02-12 NOTE — Telephone Encounter (Signed)
Pt seen on 02/05/21 by Dr. Mortimer Fries. Will close encounter.

## 2021-02-27 DIAGNOSIS — G4733 Obstructive sleep apnea (adult) (pediatric): Secondary | ICD-10-CM | POA: Diagnosis not present

## 2021-03-03 DIAGNOSIS — G4733 Obstructive sleep apnea (adult) (pediatric): Secondary | ICD-10-CM | POA: Diagnosis not present

## 2021-03-04 ENCOUNTER — Institutional Professional Consult (permissible substitution): Payer: Medicare PPO | Admitting: Pulmonary Disease

## 2021-03-10 DIAGNOSIS — R635 Abnormal weight gain: Secondary | ICD-10-CM | POA: Diagnosis not present

## 2021-03-10 DIAGNOSIS — E785 Hyperlipidemia, unspecified: Secondary | ICD-10-CM | POA: Diagnosis not present

## 2021-03-10 DIAGNOSIS — E559 Vitamin D deficiency, unspecified: Secondary | ICD-10-CM | POA: Diagnosis not present

## 2021-03-10 DIAGNOSIS — N951 Menopausal and female climacteric states: Secondary | ICD-10-CM | POA: Diagnosis not present

## 2021-03-10 DIAGNOSIS — Z6833 Body mass index (BMI) 33.0-33.9, adult: Secondary | ICD-10-CM | POA: Diagnosis not present

## 2021-03-10 DIAGNOSIS — I10 Essential (primary) hypertension: Secondary | ICD-10-CM | POA: Diagnosis not present

## 2021-03-10 LAB — COMPREHENSIVE METABOLIC PANEL
Albumin: 4.4 (ref 3.5–5.0)
Calcium: 9.7 (ref 8.7–10.7)
GFR calc Af Amer: 67
GFR calc non Af Amer: 55

## 2021-03-10 LAB — LIPID PANEL
Cholesterol: 167 (ref 0–200)
HDL: 52 (ref 35–70)
LDL Cholesterol: 99
Triglycerides: 82 (ref 40–160)

## 2021-03-10 LAB — HEPATIC FUNCTION PANEL
ALT: 24 (ref 7–35)
AST: 20 (ref 13–35)
Alkaline Phosphatase: 71 (ref 25–125)
Bilirubin, Total: 1.4

## 2021-03-10 LAB — BASIC METABOLIC PANEL
BUN: 24 — AB (ref 4–21)
CO2: 29 — AB (ref 13–22)
Chloride: 104 (ref 99–108)
Creatinine: 1 (ref 0.5–1.1)
Glucose: 95
Potassium: 3.8 (ref 3.4–5.3)
Sodium: 145 (ref 137–147)

## 2021-03-10 LAB — CBC AND DIFFERENTIAL
HCT: 43 (ref 36–46)
Hemoglobin: 13.6 (ref 12.0–16.0)
Platelets: 196 (ref 150–399)
WBC: 5.4

## 2021-03-10 LAB — IRON,TIBC AND FERRITIN PANEL
Ferritin: 86
Iron: 109

## 2021-03-10 LAB — VITAMIN D 25 HYDROXY (VIT D DEFICIENCY, FRACTURES): Vit D, 25-Hydroxy: 57

## 2021-03-10 LAB — TSH: TSH: 1.17 (ref 0.41–5.90)

## 2021-03-10 LAB — HEMOGLOBIN A1C: Hemoglobin A1C: 5

## 2021-03-10 LAB — CBC: RBC: 4.28 (ref 3.87–5.11)

## 2021-03-13 DIAGNOSIS — X32XXXA Exposure to sunlight, initial encounter: Secondary | ICD-10-CM | POA: Diagnosis not present

## 2021-03-13 DIAGNOSIS — D2272 Melanocytic nevi of left lower limb, including hip: Secondary | ICD-10-CM | POA: Diagnosis not present

## 2021-03-13 DIAGNOSIS — Z85820 Personal history of malignant melanoma of skin: Secondary | ICD-10-CM | POA: Diagnosis not present

## 2021-03-13 DIAGNOSIS — Z85828 Personal history of other malignant neoplasm of skin: Secondary | ICD-10-CM | POA: Diagnosis not present

## 2021-03-13 DIAGNOSIS — D2261 Melanocytic nevi of right upper limb, including shoulder: Secondary | ICD-10-CM | POA: Diagnosis not present

## 2021-03-13 DIAGNOSIS — D2262 Melanocytic nevi of left upper limb, including shoulder: Secondary | ICD-10-CM | POA: Diagnosis not present

## 2021-03-13 DIAGNOSIS — L570 Actinic keratosis: Secondary | ICD-10-CM | POA: Diagnosis not present

## 2021-03-16 DIAGNOSIS — N951 Menopausal and female climacteric states: Secondary | ICD-10-CM | POA: Diagnosis not present

## 2021-03-16 DIAGNOSIS — N289 Disorder of kidney and ureter, unspecified: Secondary | ICD-10-CM | POA: Diagnosis not present

## 2021-03-16 DIAGNOSIS — R945 Abnormal results of liver function studies: Secondary | ICD-10-CM | POA: Diagnosis not present

## 2021-03-16 DIAGNOSIS — Z1331 Encounter for screening for depression: Secondary | ICD-10-CM | POA: Diagnosis not present

## 2021-03-16 DIAGNOSIS — E785 Hyperlipidemia, unspecified: Secondary | ICD-10-CM | POA: Diagnosis not present

## 2021-03-16 DIAGNOSIS — Z6834 Body mass index (BMI) 34.0-34.9, adult: Secondary | ICD-10-CM | POA: Diagnosis not present

## 2021-03-16 DIAGNOSIS — R635 Abnormal weight gain: Secondary | ICD-10-CM | POA: Diagnosis not present

## 2021-03-16 DIAGNOSIS — Z1339 Encounter for screening examination for other mental health and behavioral disorders: Secondary | ICD-10-CM | POA: Diagnosis not present

## 2021-03-20 ENCOUNTER — Other Ambulatory Visit: Payer: Self-pay | Admitting: Internal Medicine

## 2021-03-24 DIAGNOSIS — Z6833 Body mass index (BMI) 33.0-33.9, adult: Secondary | ICD-10-CM

## 2021-03-24 DIAGNOSIS — G4733 Obstructive sleep apnea (adult) (pediatric): Secondary | ICD-10-CM | POA: Diagnosis not present

## 2021-03-24 DIAGNOSIS — Z8639 Personal history of other endocrine, nutritional and metabolic disease: Secondary | ICD-10-CM | POA: Insufficient documentation

## 2021-03-24 HISTORY — DX: Body mass index (BMI) 33.0-33.9, adult: Z68.33

## 2021-03-26 DIAGNOSIS — Z6834 Body mass index (BMI) 34.0-34.9, adult: Secondary | ICD-10-CM | POA: Diagnosis not present

## 2021-03-26 DIAGNOSIS — I10 Essential (primary) hypertension: Secondary | ICD-10-CM | POA: Diagnosis not present

## 2021-03-29 DIAGNOSIS — G4733 Obstructive sleep apnea (adult) (pediatric): Secondary | ICD-10-CM | POA: Diagnosis not present

## 2021-04-01 ENCOUNTER — Ambulatory Visit: Payer: Medicare PPO | Admitting: Podiatry

## 2021-04-01 ENCOUNTER — Other Ambulatory Visit: Payer: Self-pay

## 2021-04-01 DIAGNOSIS — M66871 Spontaneous rupture of other tendons, right ankle and foot: Secondary | ICD-10-CM

## 2021-04-01 DIAGNOSIS — Q666 Other congenital valgus deformities of feet: Secondary | ICD-10-CM | POA: Diagnosis not present

## 2021-04-01 DIAGNOSIS — M2011 Hallux valgus (acquired), right foot: Secondary | ICD-10-CM | POA: Diagnosis not present

## 2021-04-01 DIAGNOSIS — M19279 Secondary osteoarthritis, unspecified ankle and foot: Secondary | ICD-10-CM | POA: Diagnosis not present

## 2021-04-01 DIAGNOSIS — M2041 Other hammer toe(s) (acquired), right foot: Secondary | ICD-10-CM | POA: Diagnosis not present

## 2021-04-01 DIAGNOSIS — M21611 Bunion of right foot: Secondary | ICD-10-CM

## 2021-04-02 DIAGNOSIS — J069 Acute upper respiratory infection, unspecified: Secondary | ICD-10-CM | POA: Diagnosis not present

## 2021-04-02 DIAGNOSIS — Z03818 Encounter for observation for suspected exposure to other biological agents ruled out: Secondary | ICD-10-CM | POA: Diagnosis not present

## 2021-04-05 NOTE — Progress Notes (Addendum)
  Subjective:  Patient ID: Kathy Long, female    DOB: 11-11-1950,  MRN: [id number]  Chief Complaint  Patient presents with   tendon tear    Right ankle - discuss surgical correction - [facility] referral    70 y.o. female presents with the above complaint. History confirmed with patient.  She returns to see me today for a preoperative planning visit and surgical consideration.  Previous had examined her with Dr. Milinda Pointer while she was here in his clinic.  We are planning for surgery in January.  Ankle feels okay when she walks in the boot but otherwise is still painful.  Objective:  Physical Exam: warm, good capillary refill, no trophic changes or ulcerative lesions, normal DP and PT pulses, and normal sensory exam.  Edema and swelling over right medial hindfoot, she has a bunion deformity and hammertoes   Radiographs: Prior plain film radiographs and MRI reviewed she has significant degenerative changes in the hindfoot including the subtalar and talonavicular joint as well as a full-thickness rupture of the posterior tibial tendon with retraction and quite large bunion with hammertoes Assessment:   1. 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 Pes planovalgus   3. Secondary osteoarthritis of talonavicular joint   4. Hallux valgus with bunions, right   5. Hammertoe of right foot      Plan:  Patient was evaluated and treated and all questions answered.  We again discussed the plan for surgical reconstruction.  We discussed arthrodesis of the subtalar and talonavicular joints as well as the first tarsometatarsal joint and correction of her hammertoes.  This will address the hindfoot arthritis stabilize the hindfoot for the PT tendon tear also require likely a tenodesis.  Bone graft from the tibia.  Arthrodesis of the first tarsometatarsal joint with a Lapidus procedure will address the hallux valgus deformity as well as the plantar gapping on the first TMT.  We discussed the  risk benefits and potential complications including but not limited to pain, swelling, infection, scar, numbness which may be temporary or permanent, chronic pain, stiffness, nerve pain or damage, wound healing problems, bone healing problems including delayed or non-union.  We discussed a period of nonweightbearing.  She has a device that will allow her to put weight through the brace but not on the surgical limb.  I wrote her a referral for physical therapy for [facility] and she will go to them preoperatively for preop PT. Surgery will be scheduled for January.  All questions were addressed.  Informed consent was signed and reviewed.  None of the procedures we discussed have an arthroscopic, endoscopic or minimally invasive option and so will proceed from open approaches.   Surgical plan:  Procedure: -Hindfoot double arthrodesis, Achilles tendon lengthening, bone graft from tibia, tenodesis of the PT tendon to the FDL, Lapidus bunionectomy and hammertoe correction right foot  Location: -[facility]  Anesthesia plan: -General anesthesia with regional block  Postoperative pain plan: - Tylenol 1000 mg every 6 hours, gabapentin 300 mg every 8 hours x5 days, oxycodone 5 mg 1-2 tabs every 6 hours only as needed  DVT prophylaxis: -Xarelto 10 mg nightly  WB Restrictions / DME needs: -NWB in cast   No follow-ups on file.

## 2021-04-08 ENCOUNTER — Other Ambulatory Visit: Payer: Self-pay | Admitting: Otolaryngology

## 2021-04-09 DIAGNOSIS — G4730 Sleep apnea, unspecified: Secondary | ICD-10-CM | POA: Diagnosis not present

## 2021-04-09 DIAGNOSIS — Z6832 Body mass index (BMI) 32.0-32.9, adult: Secondary | ICD-10-CM | POA: Diagnosis not present

## 2021-04-14 ENCOUNTER — Ambulatory Visit: Payer: Medicare PPO | Admitting: Internal Medicine

## 2021-04-15 DIAGNOSIS — I10 Essential (primary) hypertension: Secondary | ICD-10-CM | POA: Diagnosis not present

## 2021-04-15 DIAGNOSIS — Z6832 Body mass index (BMI) 32.0-32.9, adult: Secondary | ICD-10-CM | POA: Diagnosis not present

## 2021-04-20 DIAGNOSIS — H26492 Other secondary cataract, left eye: Secondary | ICD-10-CM | POA: Diagnosis not present

## 2021-04-20 DIAGNOSIS — Z0100 Encounter for examination of eyes and vision without abnormal findings: Secondary | ICD-10-CM | POA: Diagnosis not present

## 2021-04-20 DIAGNOSIS — H353121 Nonexudative age-related macular degeneration, left eye, early dry stage: Secondary | ICD-10-CM | POA: Diagnosis not present

## 2021-04-21 DIAGNOSIS — Z6831 Body mass index (BMI) 31.0-31.9, adult: Secondary | ICD-10-CM | POA: Diagnosis not present

## 2021-04-21 DIAGNOSIS — G4730 Sleep apnea, unspecified: Secondary | ICD-10-CM | POA: Diagnosis not present

## 2021-04-28 ENCOUNTER — Encounter (HOSPITAL_BASED_OUTPATIENT_CLINIC_OR_DEPARTMENT_OTHER): Payer: Self-pay | Admitting: Otolaryngology

## 2021-04-28 ENCOUNTER — Telehealth: Payer: Self-pay | Admitting: Podiatry

## 2021-04-28 ENCOUNTER — Other Ambulatory Visit: Payer: Self-pay

## 2021-04-28 DIAGNOSIS — I10 Essential (primary) hypertension: Secondary | ICD-10-CM | POA: Diagnosis not present

## 2021-04-28 DIAGNOSIS — Z6831 Body mass index (BMI) 31.0-31.9, adult: Secondary | ICD-10-CM | POA: Diagnosis not present

## 2021-04-28 NOTE — Telephone Encounter (Signed)
She miss placed her prescription for PT and needs a new sent so she can go the therapy

## 2021-04-30 ENCOUNTER — Encounter (HOSPITAL_BASED_OUTPATIENT_CLINIC_OR_DEPARTMENT_OTHER)
Admission: RE | Admit: 2021-04-30 | Discharge: 2021-04-30 | Disposition: A | Discharge status: Home / Self Care | Payer: Medicare PPO | Source: Ambulatory Visit | Attending: Otolaryngology | Admitting: Otolaryngology

## 2021-04-30 DIAGNOSIS — Z01812 Encounter for preprocedural laboratory examination: Secondary | ICD-10-CM | POA: Diagnosis not present

## 2021-04-30 DIAGNOSIS — N1831 Chronic kidney disease, stage 3a: Secondary | ICD-10-CM | POA: Diagnosis not present

## 2021-04-30 LAB — BASIC METABOLIC PANEL
Anion gap: 8 (ref 5–15)
BUN: 26 mg/dL — ABNORMAL HIGH (ref 8–23)
CO2: 29 mmol/L (ref 22–32)
Calcium: 9.3 mg/dL (ref 8.9–10.3)
Chloride: 106 mmol/L (ref 98–111)
Creatinine, Ser: 1.3 mg/dL — ABNORMAL HIGH (ref 0.44–1.00)
GFR, Estimated: 44 mL/min — ABNORMAL LOW (ref >60)
Glucose, Bld: 94 mg/dL (ref 70–99)
Potassium: 4.1 mmol/L (ref 3.5–5.1)
Sodium: 143 mmol/L (ref 135–145)

## 2021-05-01 ENCOUNTER — Telehealth: Payer: Self-pay | Admitting: *Deleted

## 2021-05-01 DIAGNOSIS — R2689 Other abnormalities of gait and mobility: Secondary | ICD-10-CM

## 2021-05-01 NOTE — Telephone Encounter (Signed)
"  I'm a patient of Dr. Sherryle Lis.  I lost my piece of paper for order the [facility] on [address].  I've talked to them twice.  They have not received the order.  I have an appointment on 05/11/2021.  Please take care of that right a way."

## 2021-05-04 NOTE — Telephone Encounter (Addendum)
I faxed the prescription to [facility] on [address] as patient requested.  I left her a message the prescription was faxed to [place].

## 2021-05-04 NOTE — Addendum Note (Signed)
Addended bySherryle Lis, Rozalyn Osland R on: 05/04/2021 12:33 PM   Modules accepted: Orders

## 2021-05-05 DIAGNOSIS — Z6831 Body mass index (BMI) 31.0-31.9, adult: Secondary | ICD-10-CM | POA: Diagnosis not present

## 2021-05-05 DIAGNOSIS — G4730 Sleep apnea, unspecified: Secondary | ICD-10-CM | POA: Diagnosis not present

## 2021-05-06 ENCOUNTER — Ambulatory Visit (HOSPITAL_BASED_OUTPATIENT_CLINIC_OR_DEPARTMENT_OTHER): Payer: Medicare PPO | Admitting: Anesthesiology

## 2021-05-06 ENCOUNTER — Other Ambulatory Visit: Payer: Self-pay

## 2021-05-06 ENCOUNTER — Encounter (HOSPITAL_BASED_OUTPATIENT_CLINIC_OR_DEPARTMENT_OTHER)
Admission: RE | Disposition: A | Discharge status: Home / Self Care | Payer: Self-pay | Source: Home / Self Care | Attending: Otolaryngology

## 2021-05-06 ENCOUNTER — Ambulatory Visit (HOSPITAL_BASED_OUTPATIENT_CLINIC_OR_DEPARTMENT_OTHER)
Admission: RE | Admit: 2021-05-06 | Discharge: 2021-05-06 | Disposition: A | Discharge status: Home / Self Care | Payer: Medicare PPO | Attending: Otolaryngology | Admitting: Otolaryngology

## 2021-05-06 ENCOUNTER — Encounter (HOSPITAL_BASED_OUTPATIENT_CLINIC_OR_DEPARTMENT_OTHER): Payer: Self-pay | Admitting: Otolaryngology

## 2021-05-06 DIAGNOSIS — G4733 Obstructive sleep apnea (adult) (pediatric): Secondary | ICD-10-CM | POA: Insufficient documentation

## 2021-05-06 DIAGNOSIS — Z6830 Body mass index (BMI) 30.0-30.9, adult: Secondary | ICD-10-CM | POA: Diagnosis not present

## 2021-05-06 DIAGNOSIS — N189 Chronic kidney disease, unspecified: Secondary | ICD-10-CM | POA: Diagnosis not present

## 2021-05-06 DIAGNOSIS — I129 Hypertensive chronic kidney disease with stage 1 through stage 4 chronic kidney disease, or unspecified chronic kidney disease: Secondary | ICD-10-CM | POA: Insufficient documentation

## 2021-05-06 DIAGNOSIS — E669 Obesity, unspecified: Secondary | ICD-10-CM | POA: Diagnosis not present

## 2021-05-06 DIAGNOSIS — N1831 Chronic kidney disease, stage 3a: Secondary | ICD-10-CM

## 2021-05-06 DIAGNOSIS — Z9989 Dependence on other enabling machines and devices: Secondary | ICD-10-CM | POA: Diagnosis not present

## 2021-05-06 HISTORY — PX: DRUG INDUCED ENDOSCOPY: SHX6808

## 2021-05-06 SURGERY — DRUG INDUCED SLEEP ENDOSCOPY
Anesthesia: Monitor Anesthesia Care | Site: Nose

## 2021-05-06 MED ORDER — LACTATED RINGERS IV SOLN: INTRAVENOUS | Status: DC

## 2021-05-06 MED ORDER — OXYMETAZOLINE HCL 0.05 % NA SOLN
NASAL | Status: DC | PRN
  Administered 2021-05-06: 1 via TOPICAL

## 2021-05-06 MED ORDER — LIDOCAINE 2% (20 MG/ML) 5 ML SYRINGE
INTRAMUSCULAR | Status: DC | PRN
  Administered 2021-05-06: 60 mg via INTRAVENOUS

## 2021-05-06 MED ORDER — PROPOFOL 500 MG/50ML IV EMUL
INTRAVENOUS | Status: DC | PRN
  Administered 2021-05-06: 35 ug/kg/min via INTRAVENOUS

## 2021-05-06 SURGICAL SUPPLY — 12 items
CANISTER SUCT 1200ML W/VALVE (MISCELLANEOUS) IMPLANT
GLOVE SURG ENC MOIS LTX SZ7.5 (GLOVE) ×2 IMPLANT
KIT CLEAN ENDO (MISCELLANEOUS) ×2 IMPLANT
NEEDLE HYPO 27GX1-1/4 (NEEDLE) IMPLANT
PACK BASIN DAY SURGERY FS (CUSTOM PROCEDURE TRAY) ×2 IMPLANT
PATTIES SURGICAL .5 X3 (DISPOSABLE) ×2 IMPLANT
SHEET MEDIUM DRAPE 40X70 STRL (DRAPES) IMPLANT
SOL ANTI FOG 6CC (MISCELLANEOUS) ×1 IMPLANT
SOLUTION ANTI FOG 6CC (MISCELLANEOUS) ×1
SYR CONTROL 10ML LL (SYRINGE) IMPLANT
TOWEL GREEN STERILE FF (TOWEL DISPOSABLE) ×2 IMPLANT
TUBE CONNECTING 20X1/4 (TUBING) IMPLANT

## 2021-05-06 NOTE — Anesthesia Postprocedure Evaluation (Signed)
Anesthesia Post Note  Patient: Kathy Long  Procedure(s) Performed: DRUG INDUCED SLEEP ENDOSCOPY (Nose)     Patient location during evaluation: PACU Anesthesia Type: MAC Level of consciousness: awake and alert and oriented Pain management: pain level controlled Vital Signs Assessment: post-procedure vital signs reviewed and stable Respiratory status: spontaneous breathing, nonlabored ventilation and respiratory function stable Cardiovascular status: stable and blood pressure returned to baseline Postop Assessment: no apparent nausea or vomiting Anesthetic complications: no   No notable events documented.  Last Vitals:  Vitals:   05/06/21 0939 05/06/21 0946  BP: 136/72 (!) 147/81  Pulse: (!) 55 (!) 53  Resp: 19 16  Temp: 36.5 C   SpO2: 97% 96%    Last Pain:  Vitals:   05/06/21 0810  TempSrc: Oral  PainSc: 0-No pain                 Konica Stankowski A.

## 2021-05-06 NOTE — H&P (Addendum)
Kathy Long is an 70 y.o. female.   Chief Complaint: sleep apnea HPI: 70 year old female with obstructive sleep apnea who has had difficulty tolerating CPAP.  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     Past Surgical History:  Procedure Laterality Date   ANKLE FRACTURE SURGERY Left    APPENDECTOMY  2010   BACK SURGERY     disectomy lumbar, scar tissue   BREAST CYST EXCISION Right 1970   axilla   buninectomy Bilateral    CATARACT EXTRACTION W/PHACO Left 04/12/2018   Procedure: CATARACT EXTRACTION PHACO AND INTRAOCULAR LENS PLACEMENT ([place])  LEFT TORIC LENS;  Surgeon: Leandrew Koyanagi, MD;  Location: [facility];  Service: Ophthalmology;  Laterality: Left;   CATARACT EXTRACTION W/PHACO Right 05/10/2018   Procedure: CATARACT EXTRACTION PHACO AND INTRAOCULAR LENS PLACEMENT ([place])  RIGHT TORIC LENS;  Surgeon: Leandrew Koyanagi, MD;  Location: [facility];  Service: Ophthalmology;  Laterality: Right;  DIABETIC   CHOLECYSTECTOMY N/A 07/05/2017   Procedure: LAPAROSCOPIC CHOLECYSTECTOMY WITH INTRAOPERATIVE CHOLANGIOGRAM;  Surgeon: Robert Bellow, MD;  Location: [facility];  Service: General;  Laterality: N/A;   COLONOSCOPY     2009 and color gaurd in 2018   COLONOSCOPY N/A 03/25/2020   Procedure: COLONOSCOPY;  Surgeon: [name], MD;  Location: [facility];  Service: Endoscopy;  Laterality: N/A;   ERCP N/A 07/12/2017   Procedure: ENDOSCOPIC RETROGRADE CHOLANGIOPANCREATOGRAPHY (ERCP);  Surgeon: Lucilla Lame, MD;  Location: [facility];  Service: Endoscopy;  Laterality: N/A;   ERCP N/A 10/04/2017   Procedure: ENDOSCOPIC RETROGRADE CHOLANGIOPANCREATOGRAPHY (ERCP);   Surgeon: Lucilla Lame, MD;  Location: [facility]COPY;  Service: Endoscopy;  Laterality: N/A;   FOOT SURGERY     x 2   INCISIONAL HERNIA REPAIR N/A 12/04/2020   Procedure: VENTRAL INCISIONAL HERNIA REPAIR WITH MESH;  Surgeon: Armandina Gemma, MD;  Location: [facility];  Service: General;  Laterality: N/A;   MELANOMA EXCISION Left 2000   PALATOPLASTY N/A 04/08/2015   Procedure: PALATOPLASTY;  Surgeon: Beverly Gust, MD;  Location: [facility];  Service: ENT;  Laterality: N/A;   TONSILLECTOMY N/A 04/08/2015   Procedure: TONSILLECTOMY;  Surgeon: Beverly Gust, MD;  Location: [facility];  Service: ENT;  Laterality: N/A;   TUBAL LIGATION      Family History  Problem Relation Age of Onset   Cancer Sister 28       ovarian ca   Breast cancer Neg Hx    Social History:  reports that she has never smoked. She has never used smokeless tobacco. She reports current alcohol use. She reports that she does not use drugs.  Allergies:  Allergies  Allergen Reactions   Morphine And Related Other (See Comments)    Chest pain   Tramadol Other (See Comments)    tremors   Tape Rash         Medications Prior to Admission  Medication Sig Dispense Refill   acetaminophen (TYLENOL) 325 MG tablet Take 650 mg by mouth every 6 (six) hours as needed for moderate pain.     amLODipine (NORVASC) 5 MG tablet TAKE  1 TABLET BY MOUTH DAILY (Patient taking differently: at bedtime.) 90 tablet 1   Cholecalciferol (VITAMIN D3) 75 MCG (3000 UT) TABS Take 6,000 Units by mouth daily.     citalopram (CELEXA) 20 MG tablet TAKE 1 TABLET BY MOUTH DAILY 90 tablet 1   clobetasol (TEMOVATE) 0.05 % external solution APPLY DAILY UNTIL RASH IS CLEAR 50 mL 0   Coenzyme Q10 100 MG capsule Take 100 mg by mouth daily.     COLLAGEN PO Take 2 capsules by mouth daily.     melatonin 5 MG TABS Take 5 mg by mouth at bedtime.     metoprolol succinate (TOPROL-XL) 25 MG 24 hr tablet TAKE 1 TABLET BY MOUTH WITH OR IMMEDIATLEY FOLLOWING A MEAL EVERY DAY  (Patient taking differently: at bedtime.) 90 tablet 1   omeprazole (PRILOSEC) 40 MG capsule TAKE 1 CAPSULE BY MOUTH ONCE DAILY 90 capsule 1   rosuvastatin (CRESTOR) 10 MG tablet TAKE ONE TABLET BY MOUTH EVERY DAY 90 tablet 1   tirzepatide (MOUNJARO) 2.5 MG/0.5ML Pen Inject 2.5 mg into the skin once a week.     vitamin C (ASCORBIC ACID) 500 MG tablet Take 500 mg by mouth daily.     meloxicam (MOBIC) 15 MG tablet Take 1 tablet (15 mg total) by mouth daily. 30 tablet 3    No results found for this or any previous visit (from the past 48 hour(s)). No results found.  Review of Systems  All other systems reviewed and are negative.  Blood pressure 131/66, pulse (!) 57, temperature 97.7 F (36.5 C), temperature source Oral, resp. rate 16, height 5\' 3"  (1.6 m), weight 77.2 kg, SpO2 98 %. Physical Exam Constitutional:      Appearance: Normal appearance. She is normal weight.  HENT:     Head: Normocephalic and atraumatic.     Right Ear: External ear normal.     Left Ear: External ear normal.     Nose: Nose normal.     Mouth/Throat:     Mouth: Mucous membranes are moist.     Pharynx: Oropharynx is clear.  Eyes:     Extraocular Movements: Extraocular movements intact.     Conjunctiva/sclera: Conjunctivae normal.     Pupils: Pupils are equal, round, and reactive to light.  Cardiovascular:     Rate and Rhythm: Normal rate.  Pulmonary:     Effort: Pulmonary effort is normal.  Musculoskeletal:     Cervical back: Normal range of motion.  Skin:    General: Skin is warm and dry.  Neurological:     General: No focal deficit present.     Mental Status: She is alert and oriented to person, place, and time.  Psychiatric:        Mood and Affect: Mood normal.        Behavior: Behavior normal.        Thought Content: Thought content normal.        Judgment: Judgment normal.     Assessment/Plan Obstructive sleep apnea, BMI 30.15  To OR for DISE.  Melida Quitter, MD 05/06/2021, 9:03 AM

## 2021-05-06 NOTE — Transfer of Care (Signed)
Immediate Anesthesia Transfer of Care Note  Patient: Kathy Long  Procedure(s) Performed: DRUG INDUCED SLEEP ENDOSCOPY (N[place])  Patient Location: PACU  Anesthesia Type:MAC  Level of Consciousness: awake, alert , oriented and patient cooperative  Airway & Oxygen Therapy: Patient Spontanous Breathing and Patient connected to face mask oxygen  Post-op Assessment: Report given to RN and Post -op Vital signs reviewed and stable  Post vital signs: Reviewed and stable  Last Vitals:  Vitals Value Taken Time  BP    Temp    Pulse 55 05/06/21 0938  Resp 11 05/06/21 0938  SpO2 98 % 05/06/21 0938  Vitals shown include unvalidated device data.  Last Pain:  Vitals:   05/06/21 0810  TempSrc: Oral  PainSc: 0-No pain      Patients Stated Pain Goal: 3 (33/61/22 4497)  Complications: No notable events documented.

## 2021-05-06 NOTE — Discharge Instructions (Signed)

## 2021-05-06 NOTE — Anesthesia Preprocedure Evaluation (Addendum)
Anesthesia Evaluation  Patient identified by MRN, date of birth, ID band Patient awake    Reviewed: Allergy & Precautions, NPO status , Patient's Chart, lab work & pertinent test results, reviewed documented beta blocker date and time   Airway Mallampati: II  TM Distance: >3 FB Neck ROM: Full    Dental  (+) Teeth Intact, Dental Advisory Given, Caps   Pulmonary sleep apnea and Continuous Positive Airway Pressure Ventilation ,    Pulmonary exam normal breath sounds clear to auscultation       Cardiovascular hypertension, Pt. on medications and Pt. 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  Neuromuscular disease    GI/Hepatic Neg liver ROS, GERD  Medicated and Controlled,  Endo/Other  Hyperlipidemia  Renal/GU Renal InsufficiencyRenal disease  negative genitourinary   Musculoskeletal  (+) Arthritis , Osteoarthritis,    Abdominal (+) + obese,   Peds  Hematology negative hematology ROS (+)   Anesthesia Other Findings   Reproductive/Obstetrics                            Anesthesia Physical Anesthesia Plan  ASA: 2  Anesthesia Plan: MAC   Post-op Pain Management:    Induction: Intravenous  PONV Risk Score and Plan: 2 and Propofol infusion, Treatment may vary due to age or medical condition and Ondansetron  Airway Management Planned: Natural Airway  Additional Equipment:   Intra-op Plan:   Post-operative Plan:   Informed Consent: I have reviewed the patients History and Physical, chart, labs and discussed the procedure including the risks, benefits and alternatives for the proposed anesthesia with the patient or authorized representative who has indicated his/her understanding and acceptance.        Plan Discussed with: CRNA and Anesthesiologist  Anesthesia Plan Comments:        Anesthesia Quick Evaluation

## 2021-05-06 NOTE — Brief Op Note (Signed)
05/06/2021  9:34 AM  PATIENT:  Kathy Long  70 y.o. female  PRE-OPERATIVE DIAGNOSIS:  obstructive sleep apnea  POST-OPERATIVE DIAGNOSIS:  obstructive sleep apnea  PROCEDURE:  Procedure(s): DRUG INDUCED SLEEP ENDOSCOPY (N/A)  SURGEON:  Surgeon(s) and Role:    Melida Quitter, MD - Primary  PHYSICIAN ASSISTANT:   ASSISTANTS: none   ANESTHESIA:   IV sedation  EBL:  None   BLOOD ADMINISTERED:none  DRAINS: none   LOCAL MEDICATIONS USED:  NONE  SPECIMEN:  No Specimen  DISPOSITION OF SPECIMEN:  N/A  COUNTS:  YES  TOURNIQUET:  * No tourniquets in log *  DICTATION: .Note written in EPIC  PLAN OF CARE: Discharge to home after PACU  PATIENT DISPOSITION:  PACU - hemodynamically stable.   Delay start of Pharmacological VTE agent (>24hrs) due to surgical blood loss or risk of bleeding: no

## 2021-05-06 NOTE — Anesthesia Procedure Notes (Signed)
Procedure Name: MAC Date/Time: 05/06/2021 9:40 AM Performed by: Signe Colt, CRNA Pre-anesthesia Checklist: Patient identified, Emergency Drugs available, Suction available, Patient being monitored and Timeout performed Patient Re-evaluated:Patient Re-evaluated prior to induction Oxygen Delivery Method: Simple face mask

## 2021-05-06 NOTE — Op Note (Signed)
Preop diagnosis: Obstructive sleep apnea Postop diagnosis: same Procedure: Drug-induced sleep endoscopy Surgeon: [name] Anesth: IV sedation Compl: None Findings: There is 90% anterior-posterior collapse at the velum making her a candidate for hypoglossal nerve stimulator placement.  There was mild anterior-posterior collapse at the tongue base. Description:  After discussing risks, benefits, and alternatives, the patient was brought to the operative suite and placed on the operative table in the supine position.  Anesthesia was induced and the patient was given light sedation to simulate natural sleep. When the proper level was reached, an Afrin-soaked pledget was placed in the right nasal passage for a couple of minutes and then removed.  The fiberoptic laryngoscope was then passed to view the pharynx and larynx.  Findings are noted above and the exam was recorded.  After completion, the scope was removed and the patient was returned to anesthesia for wakeup and was moved to the recovery room in stable condition.

## 2021-05-07 DIAGNOSIS — I10 Essential (primary) hypertension: Secondary | ICD-10-CM | POA: Diagnosis not present

## 2021-05-07 DIAGNOSIS — N1832 Chronic kidney disease, stage 3b: Secondary | ICD-10-CM | POA: Diagnosis not present

## 2021-05-08 ENCOUNTER — Encounter (HOSPITAL_BASED_OUTPATIENT_CLINIC_OR_DEPARTMENT_OTHER): Payer: Self-pay | Admitting: Otolaryngology

## 2021-05-08 DIAGNOSIS — H26492 Other secondary cataract, left eye: Secondary | ICD-10-CM | POA: Diagnosis not present

## 2021-05-11 ENCOUNTER — Encounter: Payer: Self-pay | Admitting: Internal Medicine

## 2021-05-11 ENCOUNTER — Ambulatory Visit: Payer: Medicare PPO | Admitting: Internal Medicine

## 2021-05-11 ENCOUNTER — Other Ambulatory Visit: Payer: Self-pay

## 2021-05-11 VITALS — BP 104/78 | HR 66 | Temp 97.9°F | Ht 63.0 in | Wt 169.2 lb

## 2021-05-11 DIAGNOSIS — R7303 Prediabetes: Secondary | ICD-10-CM

## 2021-05-11 DIAGNOSIS — I10 Essential (primary) hypertension: Secondary | ICD-10-CM

## 2021-05-11 DIAGNOSIS — I700 Atherosclerosis of aorta: Secondary | ICD-10-CM

## 2021-05-11 DIAGNOSIS — E785 Hyperlipidemia, unspecified: Secondary | ICD-10-CM

## 2021-05-11 DIAGNOSIS — Z6830 Body mass index (BMI) 30.0-30.9, adult: Secondary | ICD-10-CM

## 2021-05-11 DIAGNOSIS — Z79899 Other long term (current) drug therapy: Secondary | ICD-10-CM

## 2021-05-11 DIAGNOSIS — G4733 Obstructive sleep apnea (adult) (pediatric): Secondary | ICD-10-CM

## 2021-05-11 DIAGNOSIS — K432 Incisional hernia without obstruction or gangrene: Secondary | ICD-10-CM

## 2021-05-11 DIAGNOSIS — F330 Major depressive disorder, recurrent, mild: Secondary | ICD-10-CM | POA: Diagnosis not present

## 2021-05-11 DIAGNOSIS — D333 Benign neoplasm of cranial nerves: Secondary | ICD-10-CM

## 2021-05-11 DIAGNOSIS — N1831 Chronic kidney disease, stage 3a: Secondary | ICD-10-CM

## 2021-05-11 DIAGNOSIS — E6609 Other obesity due to excess calories: Secondary | ICD-10-CM

## 2021-05-11 NOTE — Assessment & Plan Note (Signed)
Well controlled on current regimen of amlodipine and toprol xl. . Renal function stable, no changes today.

## 2021-05-11 NOTE — Assessment & Plan Note (Addendum)
GFR stable until recently per patient,  Attributed to dehydration. .  Nephrology referral was done in March 2020 and she has had  annual follow up.

## 2021-05-11 NOTE — Assessment & Plan Note (Signed)
She is losing weight steadily with mounjaro, prescribe by [facility]

## 2021-05-11 NOTE — Assessment & Plan Note (Addendum)
She underwent a sleep induced EGD to determien appropriateness of INSPIRE implamtation by Kathy Long on Dec 7.  Findings of study " : There is 90% anterior-posterior collapse at the velum making her a candidate for hypoglossal nerve stimulator placement.  There was mild anterior-posterior collapse at the tongue base. "  Her apneic symptoms have improved and apparently resolved with 12 lb weight loss .  Agree with plan to suspend plans for Inspire until she has achieved her target weight loss

## 2021-05-11 NOTE — Progress Notes (Addendum)
Subjective:  Patient ID: Kathy Long, female    DOB: 13-Aug-1950  Age: 70 y.o. MRN: [id number]  CC: The primary encounter diagnosis was Essential hypertension. Diagnoses of Hyperlipidemia LDL goal <130, Prediabetes, Long-term use of high-risk medication, OSA (obstructive sleep apnea), Mild episode of recurrent major depressive disorder (HCC), Class 1 obesity due to excess calories with serious comorbidity and body mass index (BMI) of 30.0 to 30.9 in adult, Stage 3a chronic kidney disease ([place]), Acoustic neuroma ([place]), Aortic atherosclerosis ([place]), and Incisional hernia, without obstruction or gangrene were also pertinent to this visit.  HPI [name] presents for follow up o multiple issues including  weight management , OSA and aortic atherosclerosis .  Last seen July 2021  Chief Complaint  Patient presents with   Follow-up    Discuss weight management    This visit occurred during the SARS-CoV-2 public health emergency.  Safety protocols were in place, including screening questions prior to the visit, additional usage of staff PPE, and extensive cleaning of exam room while observing appropriate contact time as indicated for disinfecting solutions.   Obesity:  She has  lost 12 to 14 lbs on mounjaro 2.5 mg weekly for the past 6 weeks. Starting weight was 181 .   She is taking 900 calories in daily , using  Mounjaro at bedtime. Medication is  prescribed by an MD at [facility] and diet prescribed by their nutritionist iin  [place] .  Weight has not plateaued yet so she is staying at the lowest dose.    personal best 158 lbs in  march .  OSA:  has met with Dr [name] in [facility] and considering the implantation of the OSA treating device called "Inspire.".  She has not  been using CPAP for the past month and husband says she is not snoring or gasping during sleep,   Discussed  the oral devices  as alternatives to INSPIRE and CPAP   CKD:  last cr was 1.3 likely due to dehydration.  Nephrology has ordered  labs    [facility] arch:  surgery delayed until Jan 6 after failing PT, injections, surgeon is [place]  , tendon repair is needed, I.e, she was advised that it would be a complicated surgery that will require non weight bearing status for 8 weeks .  Wants to use a Freedom Leg she found on line that has been approved by [facility].  Getting PT today to learn how to use it.   Told by [facility] to weigh the apparatus   Reviewed findings of prior CT scan today..  Patient is tolerating high potency statin therapy  with Crestor and LFTs were recently checked by her alternative medicine MD at [facility]   She is concerned about her rehab and recovery from foot surgery complicated by chronically Poor balance due to  her acoustic neuroma.   Outpatient Medications Prior to Visit  Medication Sig Dispense Refill   acetaminophen (TYLENOL) 325 MG tablet Take 650 mg by mouth every 6 (six) hours as needed for moderate pain.     amLODipine (NORVASC) 5 MG tablet TAKE 1 TABLET BY MOUTH DAILY (Patient taking differently: at bedtime.) 90 tablet 1   Cholecalciferol (VITAMIN D3) 75 MCG (3000 UT) TABS Take 6,000 Units by mouth daily.     citalopram (CELEXA) 20 MG tablet TAKE 1 TABLET BY MOUTH DAILY 90 tablet 1   clobetasol (TEMOVATE) 0.05 % external solution APPLY DAILY UNTIL RASH IS CLEAR 50 mL 0  Coenzyme Q10 100 MG capsule Take 100 mg by mouth daily.     melatonin 5 MG TABS Take 5 mg by mouth at bedtime.     metoprolol succinate (TOPROL-XL) 25 MG 24 hr tablet TAKE 1 TABLET BY MOUTH WITH OR IMMEDIATLEY FOLLOWING A MEAL EVERY DAY (Patient taking differently: at bedtime.) 90 tablet 1   omeprazole (PRILOSEC) 40 MG capsule TAKE 1 CAPSULE BY MOUTH ONCE DAILY 90 capsule 1   rosuvastatin (CRESTOR) 10 MG tablet TAKE ONE TABLET BY MOUTH EVERY DAY 90 tablet 1   tirzepatide (MOUNJARO) 2.5 MG/0.5ML Pen Inject 2.5 mg into the skin once a week.     vitamin C (ASCORBIC ACID) 500 MG tablet Take 500 mg by mouth daily.     COLLAGEN PO Take 2  capsules by mouth daily.     meloxicam (MOBIC) 15 MG tablet Take 1 tablet (15 mg total) by mouth daily. 30 tablet 3   No facility-administered medications prior to visit.    Review of Systems;  Patient denies headache, fevers, malaise, unintentional weight loss, skin rash, eye pain, sinus congestion and sinus pain, sore throat, dysphagia,  hemoptysis , cough, dyspnea, wheezing, chest pain, palpitations, orthopnea, edema, abdominal pain, nausea, melena, diarrhea, constipation, flank pain, dysuria, hematuria, urinary  Frequency, nocturia, numbness, tingling, seizures,  Focal weakness, Loss of consciousness,  Tremor, insomnia, depression, anxiety, and suicidal ideation.      Objective:  BP 104/78 (BP Location: Left Arm, Patient Position: Sitting, Cuff Size: Normal)   Pulse 66   Temp 97.9 F (36.6 C) (Oral)   Ht _0  (1.6 m)   Wt 169 lb 3.2 oz (76.7 kg)   SpO2 96%   BMI 29.97 kg/m   BP Readings from Last 3 Encounters:  05/11/21 104/78  05/06/21 (!) 147/78  02/05/21 120/82    Wt Readings from Last 3 Encounters:  05/11/21 169 lb 3.2 oz (76.7 kg)  05/06/21 170 lb 3.1 oz (77.2 kg)  02/05/21 178 lb 12.8 oz (81.1 kg)    General appearance: alert, cooperative and appears stated age Ears: normal TM's and external ear canals both ears Throat: lips, mucosa, and tongue normal; teeth and gums normal Neck: no adenopathy, no carotid bruit, supple, symmetrical, trachea midline and thyroid not enlarged, symmetric, no tenderness/mass/nodules Back: symmetric, no curvature. ROM normal. No CVA tenderness. Lungs: clear to auscultation bilaterally Heart: regular rate and rhythm, S1, S2 normal, no murmur, click, rub or gallop Abdomen: soft, non-tender; bowel sounds normal; no masses,  no organomegaly Pulses: 2+ and symmetric Skin: Skin color, texture, turgor normal. No rashes or lesions Lymph nodes: Cervical, supraclavicular, and axillary nodes normal.  Lab Results  Component Value Date    HGBA1C 5.8 02/28/2019   HGBA1C 5.5 08/23/2018   HGBA1C 5.4 03/09/2018    Lab Results  Component Value Date   CREATININE 1.30 (H) 04/30/2021   CREATININE 1.18 (H) 12/02/2020   CREATININE 1.02 07/15/2020    Lab Results  Component Value Date   WBC 6.4 12/02/2020   HGB 13.7 12/02/2020   HCT 41.7 12/02/2020   PLT 217 12/02/2020   GLUCOSE 94 04/30/2021   CHOL 157 12/10/2019   TRIG 149.0 12/10/2019   HDL 51.00 12/10/2019   LDLCALC 77 12/10/2019   ALT 23 07/15/2020   AST 20 07/15/2020   NA 143 04/30/2021   K 4.1 04/30/2021   CL 106 04/30/2021   CREATININE 1.30 (H) 04/30/2021   BUN 26 (H) 04/30/2021   CO2 29 04/30/2021   TSH  2.19 12/10/2019   INR 0.91 10/23/2009   HGBA1C 5.8 02/28/2019    No results found.  Assessment & Plan:   Problem List Items Addressed This Visit     OSA (obstructive sleep apnea)    She underwent a sleep induced EGD to determien appropriateness of INSPIRE implamtation by Melida Quitter on Dec 7.  Findings of study " : There is 90% anterior-posterior collapse at the velum making her a candidate for hypoglossal nerve stimulator placement.  There was mild anterior-posterior collapse at the tongue base. "  Her apneic symptoms have improved and apparently resolved with 12 lb weight loss .  Agree with plan to suspend plans for Inspire until she has achieved her target weight loss       Obesity    She is losing weight steadily with mounjaro, prescribe by [facility]       Major depressive disorder, 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[place])    No longer using wellbutrin   Symptoms controlled with citalopram       Incisional hernia, without obstruction or gangrene    S/p repair July 6314 without complications       RESOLVED: Hyperlipidemia LDL goal <130   Relevant Orders   Hepatic function panel   Lipid panel   Essential hypertension - Primary    Well controlled on current regimen of amlodipine and toprol xl. . Renal function stable, no changes today.      CKD (chronic  kidney disease) stage 3, GFR 30-59 ml/min (HCC)    GFR stable until recently per patient,  Attributed to dehydration. .  Nephrology referral was done in March 2020 and she has had  annual follow up.      Aortic atherosclerosis ([place])    Reviewed findings of prior imaging study .Marland Kitchen  Patient is tolerating high potency statin therapy and LFTS are reportedly normal  Lab Results  Component Value Date   CHOL 157 12/10/2019   HDL 51.00 12/10/2019   LDLCALC 77 12/10/2019   TRIG 149.0 12/10/2019   CHOLHDL 3 12/10/2019         Acoustic neuroma ([place])    Managed with serial MRI and hearing tests by ENT.  Progressive hearing loss noted and balance impairment       Other Visit Diagnoses     Prediabetes       Long-term use of high-risk medication           I have discontinued Kathy Long "Debbie"'s meloxicam and COLLAGEN PO. I am also having her maintain her melatonin, acetaminophen, Coenzyme Q10, clobetasol, Vitamin D3, vitamin C, amLODipine, citalopram, metoprolol succinate, rosuvastatin, omeprazole, and Mounjaro.  No orders of the defined types were placed in this encounter.   I provided  40 minutes of  face-to-face time during this encounter reviewing patient's current problems and past surgeries, labs and imaging studies, providing counseling on the above mentioned problems , and coordination  of care .   Follow-up: No follow-ups on file.   [name], MD

## 2021-05-11 NOTE — Assessment & Plan Note (Addendum)
No longer using wellbutrin   Symptoms controlled with citalopram

## 2021-05-11 NOTE — Assessment & Plan Note (Addendum)
Managed with serial MRI and hearing tests by ENT.  Progressive hearing loss noted and balance impairment

## 2021-05-12 ENCOUNTER — Telehealth: Payer: Self-pay | Admitting: Internal Medicine

## 2021-05-12 ENCOUNTER — Telehealth (INDEPENDENT_AMBULATORY_CARE_PROVIDER_SITE_OTHER): Payer: Medicare PPO | Admitting: Internal Medicine

## 2021-05-12 ENCOUNTER — Encounter: Payer: Self-pay | Admitting: Internal Medicine

## 2021-05-12 DIAGNOSIS — I700 Atherosclerosis of aorta: Secondary | ICD-10-CM | POA: Insufficient documentation

## 2021-05-12 DIAGNOSIS — Z8616 Personal history of COVID-19: Secondary | ICD-10-CM | POA: Insufficient documentation

## 2021-05-12 DIAGNOSIS — U071 COVID-19: Secondary | ICD-10-CM | POA: Diagnosis not present

## 2021-05-12 HISTORY — DX: Personal history of COVID-19: Z86.16

## 2021-05-12 HISTORY — DX: Atherosclerosis of aorta: I70.0

## 2021-05-12 MED ORDER — MOLNUPIRAVIR EUA 200MG CAPSULE: 4.0000 | ORAL_CAPSULE | Freq: Two times a day (BID) | ORAL | 0 refills | Status: AC

## 2021-05-12 MED ORDER — CHERATUSSIN AC 100-10 MG/5ML PO SOLN: 5.0000 mL | Freq: Three times a day (TID) | ORAL | 0 refills | Status: DC | PRN

## 2021-05-12 NOTE — Addendum Note (Signed)
Addended by: [name] on: 05/12/2021 09:28 AM   Modules accepted: Orders

## 2021-05-12 NOTE — Assessment & Plan Note (Signed)
S/p repair July 5750 without complications

## 2021-05-12 NOTE — Assessment & Plan Note (Signed)
She is not short of breath and denies pleurisy. molnupiravir prescribed.  Continue supportive care and quarantine for a minimum of 5 days

## 2021-05-12 NOTE — Assessment & Plan Note (Signed)
Reviewed findings of prior imaging study .Kathy Long  Patient is tolerating high potency statin therapy and LFTS are reportedly normal  Lab Results  Component Value Date   CHOL 157 12/10/2019   HDL 51.00 12/10/2019   LDLCALC 77 12/10/2019   TRIG 149.0 12/10/2019   CHOLHDL 3 12/10/2019

## 2021-05-12 NOTE — Progress Notes (Signed)
Virtual Visit via [place]  Note  This visit type was conducted due to national recommendations for restrictions regarding the COVID-19 pandemic (e.g. social distancing).  This format is felt to be most appropriate for this patient at this time.  All issues noted in this document were discussed and addressed.  No physical exam was performed (except for noted visual exam findings with Video Visits).   I connected withNAME@ on 05/12/21 at  3:00 PM EST by a video enabled telemedicine application and verified that I am speaking with the correct person using two identifiers. Location patient: home Location provider: work or home office Persons participating in the virtual visit: patient, provider  I discussed the limitations, risks, security and privacy concerns of performing an evaluation and management service by telephone and the availability of in person appointments. I also discussed with the patient that there may be a patient responsible charge related to this service. The patient expressed understanding and agreed to proceed.  Interactive audio and video telecommunications were attempted between this provider and patient, however failed, due to patient having technical difficulties . (Could not get camera to work)  We continued and completed visit with audio only.  Reason for visit: COVID POSITIVE   HPI:  70 YR OLD female with OSA , hyperlipidemia,  seen yesterday in the ffice for one year  follow up presents today with sudden onset of cough, congestion , body aches ,  sore throat and headache that started SEVERAL hours after leaving our office  yesterday..  Didn't sleep well due to constellation of symptoms.   Not short of breath.  POSITIVE COVID TEST TODAY   Exposure appears to have occurred on Sunday,  when she attended a live nativity scene on Sunday to see her grandson play Joseph,  and visited with several old friends who had come into contact with COVID    ROS: See pertinent  positives and negatives per HPI.  Past Medical History:  Diagnosis Date   Acoustic neuroma (HCC)    left ear   Anxiety    Arthritis    lower back, hands   Cancer (HCC)    melanoma, shoulder   Chronic kidney disease    Depression    GERD (gastroesophageal reflux disease)    Hyperlipidemia    Hypertension    Postoperative bile leak    Sleep apnea    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     Past Surgical History:  Procedure Laterality Date   ANKLE FRACTURE SURGERY Left    APPENDECTOMY  2010   BACK SURGERY     disectomy lumbar, scar tissue   BREAST CYST EXCISION Right 1970   axilla   buninectomy Bilateral    CATARACT EXTRACTION W/PHACO Left 04/12/2018   Procedure: CATARACT EXTRACTION PHACO AND INTRAOCULAR LENS PLACEMENT (IOC)  LEFT TORIC LENS;  Surgeon: Brasington, Chadwick, MD;  Location: [facility];  Service: Ophthalmology;  Laterality: Left;   CATARACT EXTRACTION W/PHACO Right 05/10/2018   Procedure: CATARACT EXTRACTION PHACO AND INTRAOCULAR LENS PLACEMENT (IOC)  RIGHT TORIC LENS;  Surgeon: Brasington, Chadwick, MD;  Location: [facility];  Service: Ophthalmology;  Laterality: Right;  DIABETIC   CHOLECYSTECTOMY N/A 07/05/2017   Procedure: LAPAROSCOPIC CHOLECYSTECTOMY WITH INTRAOPERATIVE CHOLANGIOGRAM;  Surgeon: Byrnett, Jeffrey W, MD;  Location: [facility];  Service: General;  Laterality: N/A;   COLONOSCOPY     20 09 and color gaurd in 2018   COLONOSCOPY N/A 03/25/2020   Procedure: COLONOSCOPY;  Surgeon: [name], MD;  Location: [facility];  Service: Endoscopy;  Laterality: N/A;   DRUG INDUCED ENDOSCOPY N/A 05/06/2021   Procedure: DRUG INDUCED SLEEP ENDOSCOPY;  Surgeon: Melida Quitter, MD;  Location: [facility];  Service: ENT;  Laterality: N/A;   ERCP N/A 07/12/2017   Procedure: ENDOSCOPIC RETROGRADE CHOLANGIOPANCREATOGRAPHY (ERCP);  Surgeon: Lucilla Lame, MD;  Location: [facility] END[facility]COPY;  Service: Endoscopy;  Laterality: N/A;   ERCP N/A  10/04/2017   Procedure: ENDOSCOPIC RETROGRADE CHOLANGIOPANCREATOGRAPHY (ERCP);  Surgeon: Lucilla Lame, MD;  Location: [facility] END[facility]COPY;  Service: Endoscopy;  Laterality: N/A;   FOOT SURGERY     x 2   INCISIONAL HERNIA REPAIR N/A 12/04/2020   Procedure: VENTRAL INCISIONAL HERNIA REPAIR WITH MESH;  Surgeon: Armandina Gemma, MD;  Location: [facility];  Service: General;  Laterality: N/A;   MELANOMA EXCISION Left 2000   PALATOPLASTY N/A 04/08/2015   Procedure: PALATOPLASTY;  Surgeon: Beverly Gust, MD;  Location: [facility];  Service: ENT;  Laterality: N/A;   TONSILLECTOMY N/A 04/08/2015   Procedure: TONSILLECTOMY;  Surgeon: Beverly Gust, MD;  Location: [facility];  Service: ENT;  Laterality: N/A;   TUBAL LIGATION      Family History  Problem Relation Age of Onset   Cancer Sister 39       ovarian ca   Breast cancer Neg Hx     SOCIAL HX:  reports that she has never smoked. She has never used smokeless tobacco. She reports current alcohol use. She reports that she does not use drugs.    Current Outpatient Medications:    acetaminophen (TYLENOL) 325 MG tablet, Take 650 mg by mouth every 6 (six) hours as needed for moderate pain., Disp: , Rfl:    amLODipine (NORVASC) 5 MG tablet, TAKE 1 TABLET BY MOUTH DAILY (Patient taking differently: at bedtime.), Disp: 90 tablet, Rfl: 1   Cholecalciferol (VITAMIN D3) 75 MCG (3000 UT) TABS, Take 6,000 Units by mouth daily., Disp: , Rfl:    citalopram (CELEXA) 20 MG tablet, TAKE 1 TABLET BY MOUTH DAILY, Disp: 90 tablet, Rfl: 1   Coenzyme Q10 100 MG capsule, Take 100 mg by mouth daily., Disp: , Rfl:    guaiFENesin-codeine (CHERATUSSIN AC) 100-10 MG/5ML syrup, Take 5 mLs by mouth 3 (three) times daily as needed for cough., Disp: 120 mL, Rfl: 0   melatonin 5 MG TABS, Take 5 mg by mouth at bedtime., Disp: , Rfl:    metoprolol succinate (TOPROL-XL) 25 MG 24 hr tablet, TAKE 1 TABLET BY MOUTH WITH OR IMMEDIATLEY FOLLOWING A MEAL EVERY DAY (Patient taking differently: at  bedtime.), Disp: 90 tablet, Rfl: 1   molnupiravir EUA (LAGEVRIO) 200 mg CAPS capsule, Take 4 capsules (800 mg total) by mouth 2 (two) times daily for 5 days., Disp: 40 capsule, Rfl: 0   omeprazole (PRILOSEC) 40 MG capsule, TAKE 1 CAPSULE BY MOUTH ONCE DAILY, Disp: 90 capsule, Rfl: 1   rosuvastatin (CRESTOR) 10 MG tablet, TAKE ONE TABLET BY MOUTH EVERY DAY, Disp: 90 tablet, Rfl: 1   tirzepatide (MOUNJARO) 2.5 MG/0.5ML Pen, Inject 2.5 mg into the skin once a week., Disp: , Rfl:    vitamin C (ASCORBIC ACID) 500 MG tablet, Take 500 mg by mouth daily., Disp: , Rfl:    clobetasol (TEMOVATE) 0.05 % external solution, APPLY DAILY UNTIL RASH IS CLEAR (Patient not taking: Reported on 05/12/2021), Disp: 50 mL, Rfl: 0  EXAM:    General impression: alert, cooperative and articulate.  No signs of being in distress  Lungs: speech is fluent sentence  length suggests that patient is not short of breath and not punctuated by cough, sneezing or sniffing. Marland Kitchen   Psych: affect normal.  speech is articulate and non pressured .  Denies suicidal thoughts     ASSESSMENT AND PLAN:  Discussed the following assessment and plan:  Acute COVID-19  Acute COVID-19 She is not short of breath and denies pleurisy. molnupiravir prescribed.  Continue supportive care and quarantine for a minimum of 5 days     I discussed the assessment and treatment plan with the patient. The patient was provided an opportunity to ask questions and all were answered. The patient agreed with the plan and demonstrated an understanding of the instructions.   The patient was advised to call back or seek an in-person evaluation if the symptoms worsen or if the condition fails to improve as anticipated.   I spent 15 minutes dedicated to the care of this patient on the date of this encounter to include pre-visit review of his medical history,  non Face-to-face time with the patient , and post visit ordering of testing and therapeutics.    [name], MD

## 2021-05-14 MED ORDER — AMOXICILLIN-POT CLAVULANATE 875-125 MG PO TABS: 1.0000 | ORAL_TABLET | Freq: Two times a day (BID) | ORAL | 0 refills | Status: DC

## 2021-05-14 NOTE — Telephone Encounter (Signed)
Patient seen by PCP on 05/12/21 Patient stated that her cough is now producing Green colored Mucous, and PCP advised to call back if this happen and will callin medication patient covid positive on molnupiravir.

## 2021-05-18 ENCOUNTER — Telehealth: Payer: Self-pay | Admitting: Urology

## 2021-05-18 NOTE — Telephone Encounter (Signed)
DOS - 06/05/21  LAPIDUS PROC INCLUDING BUNIONECTOMY RIGHT --- 37366 TENDO ACHILLES LENGTH RIGHT --- 81594 TENODESIS RIGHT --- 70761 [facility] JOINT FUSION RIGHT --- 51834 SUBTALAR ARTHRODESIS RIGHT --- 37357 HAMMERTOE REPAIR 2,3 RIGHT --- 89784 BONE GRAFT RIGHT --- 20900  [facility] EFFECTIVE DATE - 06/01/19  PER [place] WEBSITE FOR CPT CODES 78412, 82081, 38871 AND 95974 NO AUTH IS REQUIRED. FOR CPT CODES 71855, [id number] AND 82574 HAVE BEEN APPROVED, AUTH # [id number], GOOD FROM 06/05/21 - 09/03/21.  REF # [id number] REF # [id number]

## 2021-05-20 ENCOUNTER — Ambulatory Visit: Payer: Medicare PPO | Admitting: Podiatry

## 2021-05-20 ENCOUNTER — Other Ambulatory Visit: Payer: Self-pay

## 2021-05-20 DIAGNOSIS — M21611 Bunion of right foot: Secondary | ICD-10-CM

## 2021-05-20 DIAGNOSIS — R2689 Other abnormalities of gait and mobility: Secondary | ICD-10-CM | POA: Diagnosis not present

## 2021-05-20 DIAGNOSIS — M66871 Spontaneous rupture of other tendons, right ankle and foot: Secondary | ICD-10-CM

## 2021-05-20 DIAGNOSIS — M2011 Hallux valgus (acquired), right foot: Secondary | ICD-10-CM

## 2021-05-20 DIAGNOSIS — Q666 Other congenital valgus deformities of feet: Secondary | ICD-10-CM

## 2021-05-20 DIAGNOSIS — M19279 Secondary osteoarthritis, unspecified ankle and foot: Secondary | ICD-10-CM

## 2021-05-20 NOTE — Progress Notes (Signed)
°  Subjective:  Patient ID: Kathy Long, female    DOB: 01-17-1951,  MRN: [id number]  Chief Complaint  Patient presents with   Tendonitis    6 week follow up right foot    70 y.o. female presents with the above complaint. History confirmed with patient.   Interval history The [place] walker was not successful and multiple physical therapies were not able to get to work.  She is in the process of trying to refund for this.  She is here today to discuss further options for postop care for rehab following her surgery upcoming.  Objective:  Physical Exam: warm, good capillary refill, no trophic changes or ulcerative lesions, normal DP and PT pulses, and normal sensory exam.  Edema and swelling over right medial hindfoot, she has a bunion deformity and hammertoes   Radiographs: Prior plain film radiographs and MRI reviewed she has significant degenerative changes in the hindfoot including the subtalar and talonavicular joint as well as a full-thickness rupture of the posterior tibial tendon with retraction and quite large bunion with hammertoes Assessment:   1. Inability to bear weight   2. 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 Pes planovalgus   4. Secondary osteoarthritis of talonavicular joint   5. Hallux valgus with bunions, right       Plan:  Patient was evaluated and treated and all questions answered.  We again discussed the plan for surgical reconstruction.  We discussed arthrodesis of the subtalar and talonavicular joints as well as the first tarsometatarsal joint and correction of her hammertoes.  This will address the hindfoot arthritis stabilize the hindfoot for the PT tendon tear also require likely a tenodesis.  Bone graft from the tibia.  Arthrodesis of the first tarsometatarsal joint with a Lapidus procedure will address the hallux valgus deformity as well as the plantar gapping on the first TMT.  We discussed the risk benefits and potential  complications including but not limited to pain, swelling, infection, scar, numbness which may be temporary or permanent, chronic pain, stiffness, nerve pain or damage, wound healing problems, bone healing problems including delayed or non-union.  We discussed a period of nonweightbearing.     Today we also discussed her postop plan for physical therapy.  We will see if she is in network for home physical therapy and she will check on her benefits for this.  We also discussed the option of moving her surgery to the hospital and having an overnight stay and then transition to a subacute rehab such as [facility] facility which she will think about let me know if she is interested in this.     Surgical plan:  Procedure: -Hindfoot double arthrodesis, Achilles tendon lengthening, bone graft from tibia, tenodesis of the PT tendon to the FDL, Lapidus bunionectomy and hammertoe correction right foot  Location: -[facility]  Anesthesia plan: -General anesthesia with regional block  Postoperative pain plan: - Tylenol 1000 mg every 6 hours, gabapentin 300 mg every 8 hours x5 days, oxycodone 5 mg 1-2 tabs every 6 hours only as needed  DVT prophylaxis: -Xarelto 10 mg nightly  WB Restrictions / DME needs: -NWB in cast   No follow-ups on file.

## 2021-05-21 ENCOUNTER — Encounter: Payer: Self-pay | Admitting: Podiatry

## 2021-05-27 ENCOUNTER — Encounter: Payer: Self-pay | Admitting: Podiatry

## 2021-05-28 ENCOUNTER — Other Ambulatory Visit: Payer: Self-pay | Admitting: Podiatry

## 2021-05-28 DIAGNOSIS — R2689 Other abnormalities of gait and mobility: Secondary | ICD-10-CM

## 2021-05-28 DIAGNOSIS — N1832 Chronic kidney disease, stage 3b: Secondary | ICD-10-CM | POA: Diagnosis not present

## 2021-05-28 DIAGNOSIS — I10 Essential (primary) hypertension: Secondary | ICD-10-CM | POA: Diagnosis not present

## 2021-05-28 NOTE — Telephone Encounter (Signed)
Please advise 

## 2021-05-28 NOTE — Telephone Encounter (Signed)
Addressed by Darrick Penna and discussed with me today

## 2021-05-29 ENCOUNTER — Telehealth: Payer: Self-pay | Admitting: Internal Medicine

## 2021-05-29 ENCOUNTER — Encounter: Payer: Self-pay | Admitting: Internal Medicine

## 2021-05-29 DIAGNOSIS — Z6830 Body mass index (BMI) 30.0-30.9, adult: Secondary | ICD-10-CM | POA: Diagnosis not present

## 2021-05-29 DIAGNOSIS — Z8742 Personal history of other diseases of the female genital tract: Secondary | ICD-10-CM | POA: Diagnosis not present

## 2021-05-29 DIAGNOSIS — I10 Essential (primary) hypertension: Secondary | ICD-10-CM | POA: Diagnosis not present

## 2021-05-29 NOTE — Telephone Encounter (Signed)
Placed in results folder.  

## 2021-05-29 NOTE — Telephone Encounter (Signed)
Pt came into office to drop off lab orders from [facility]. Pt stated provider wanted a copy of them. Placed in provider folder

## 2021-06-03 ENCOUNTER — Encounter: Payer: Self-pay | Admitting: Podiatry

## 2021-06-04 ENCOUNTER — Telehealth: Payer: Self-pay | Admitting: Internal Medicine

## 2021-06-04 DIAGNOSIS — Z1231 Encounter for screening mammogram for malignant neoplasm of breast: Secondary | ICD-10-CM

## 2021-06-04 NOTE — Telephone Encounter (Signed)
Patient will need an order to Schedule Mammogram.    Appointment Request From: Kathy Long    With Provider: [name], MD [facility] [place]]    Preferred Date Range: From 07/29/2021 To 08/07/2021    Preferred Times: Any    Reason: To address the following health maintenance concerns.  Mammogram    Comments:  Since surgery is tomorrow, I wanted to schedule this mammogram for early March. Best time will be around 10:30 a.m.   Appointment Request (HM) (Newest Message First) Kathy Long "Kathy Long"  Patient HM Schedule Request Pool 2 hours ago (9:16 AM)   Appointment Request From: Kathy Long   With Provider: [name], MD [facility] [place]]   Preferred Date Range: From 07/29/2021 To 08/07/2021   Preferred Times: Any   Reason: To address the following health maintenance concerns. Mammogram   Comments: Since surgery is tomorrow, I wanted to schedule this mammogram for early March. Best time will be around 10:30 a.m.

## 2021-06-04 NOTE — Telephone Encounter (Signed)
LMTCB. Need to let pt know that her mammogram has been scheduled for March 7th at 10:40. If this does not work she can give [facility] a call to reschedule.

## 2021-06-04 NOTE — Telephone Encounter (Signed)
Mammogram has been ordered. Will call [place] to schedule once they open back up from lunch.

## 2021-06-05 ENCOUNTER — Emergency Department (HOSPITAL_COMMUNITY)
Admission: EM | Admit: 2021-06-05 | Discharge: 2021-06-05 | Disposition: A | Discharge status: Home / Self Care | Payer: Medicare PPO | Attending: Emergency Medicine | Admitting: Emergency Medicine

## 2021-06-05 ENCOUNTER — Telehealth: Payer: Self-pay | Admitting: *Deleted

## 2021-06-05 ENCOUNTER — Telehealth: Payer: Self-pay | Admitting: Podiatry

## 2021-06-05 ENCOUNTER — Other Ambulatory Visit: Payer: Self-pay | Admitting: Podiatry

## 2021-06-05 ENCOUNTER — Encounter: Payer: Self-pay | Admitting: Podiatry

## 2021-06-05 DIAGNOSIS — M150 Primary generalized (osteo)arthritis: Secondary | ICD-10-CM | POA: Diagnosis not present

## 2021-06-05 DIAGNOSIS — I9788 Other intraoperative complications of the circulatory system, not elsewhere classified: Secondary | ICD-10-CM | POA: Diagnosis not present

## 2021-06-05 DIAGNOSIS — R001 Bradycardia, unspecified: Secondary | ICD-10-CM | POA: Diagnosis not present

## 2021-06-05 DIAGNOSIS — Z5321 Procedure and treatment not carried out due to patient leaving prior to being seen by health care provider: Secondary | ICD-10-CM | POA: Diagnosis not present

## 2021-06-05 DIAGNOSIS — I4891 Unspecified atrial fibrillation: Secondary | ICD-10-CM | POA: Insufficient documentation

## 2021-06-05 DIAGNOSIS — M89771 Major osseous defect, right ankle and foot: Secondary | ICD-10-CM | POA: Diagnosis not present

## 2021-06-05 DIAGNOSIS — M66871 Spontaneous rupture of other tendons, right ankle and foot: Secondary | ICD-10-CM | POA: Diagnosis not present

## 2021-06-05 DIAGNOSIS — M2141 Flat foot [pes planus] (acquired), right foot: Secondary | ICD-10-CM | POA: Diagnosis not present

## 2021-06-05 DIAGNOSIS — M65871 Other synovitis and tenosynovitis, right ankle and foot: Secondary | ICD-10-CM | POA: Diagnosis not present

## 2021-06-05 DIAGNOSIS — M2041 Other hammer toe(s) (acquired), right foot: Secondary | ICD-10-CM | POA: Diagnosis not present

## 2021-06-05 DIAGNOSIS — M19071 Primary osteoarthritis, right ankle and foot: Secondary | ICD-10-CM | POA: Diagnosis not present

## 2021-06-05 DIAGNOSIS — M6701 Short Achilles tendon (acquired), right ankle: Secondary | ICD-10-CM | POA: Diagnosis not present

## 2021-06-05 DIAGNOSIS — M25571 Pain in right ankle and joints of right foot: Secondary | ICD-10-CM | POA: Diagnosis not present

## 2021-06-05 DIAGNOSIS — M2011 Hallux valgus (acquired), right foot: Secondary | ICD-10-CM | POA: Diagnosis not present

## 2021-06-05 MED ORDER — ACETAMINOPHEN 500 MG PO TABS: 1000.0000 mg | ORAL_TABLET | Freq: Four times a day (QID) | ORAL | 0 refills | Status: AC | PRN

## 2021-06-05 MED ORDER — OXYCODONE HCL 5 MG PO TABS
5.0000 mg | ORAL_TABLET | ORAL | 0 refills | Status: DC | PRN
Start: 2021-06-05 — End: 2021-06-11

## 2021-06-05 MED ORDER — RIVAROXABAN 10 MG PO TABS: 10.0000 mg | ORAL_TABLET | Freq: Every day | ORAL | 0 refills | Status: DC

## 2021-06-05 MED ORDER — GABAPENTIN 300 MG PO CAPS: 300.0000 mg | ORAL_CAPSULE | Freq: Three times a day (TID) | ORAL | 0 refills | Status: DC

## 2021-06-05 NOTE — Telephone Encounter (Signed)
"  My mother is [place].  She was scheduled and had surgery with Dr. Milinda Pointer and another doctor today that didn't go as planned.  She is in the emergency room hallway.  Our day is freaking out because they are not doing anything with her.  We don't know any instructions in terms of where the surgery was left off.  What care needs to happen.  The emergency is a long long wait.  We're trying to communicate that with our dad and he is not having it, he's trying to take her out of there and take her somewhere else. The biggest thing we're concerned about is that we don't have any discharge from the surgery.  She was sent to the emergency room for a blood clot.  Could that be a problem?  I'm probably not on her list for HIPAA but could you give me or my dad a call to give [place] some information.  My number is [phone] and my dad's is [phone].

## 2021-06-05 NOTE — ED Provider Triage Note (Signed)
Emergency Medicine Provider Triage Evaluation Note  Kathy Long , a 71 y.o. female  was evaluated in triage.  Pt complains of atrial fibrillation.  Patient was in the middle of surgery for right foot double arthrodesis, hammertoe correction by Dr. Sherryle Lis this morning when apparently she went into atrial fibrillation.  The surgery was stopped and patient was transferred over to [facility].  On arrival patient is in sinus rhythm with slight bradycardia to 57.  She denies lightheadedness or dizziness.  Denies chest pain.  Denies shortness of breath.  She states that she has never had atrial fibrillation before.  She states that she takes metoprolol for high blood pressure and does not see a cardiologist.  Not anticoagulated.  Review of Systems  Positive: See above Negative:   Physical Exam  BP 121/67 (BP Location: Right Arm)    Pulse 60    Temp (!) 97.5 F (36.4 C) (Oral)    Resp 16    SpO2 98%  Gen:   Awake, no distress   Resp:  Normal effort  MSK:   Moves extremities without difficulty  Other:  S1/S2 without murmur.  Medical Decision Making  Medically screening exam initiated at 1:09 PM.  Appropriate orders placed.  Haydee Salter was informed that the remainder of the evaluation will be completed by another provider, this initial triage assessment does not replace that evaluation, and the importance of remaining in the ED until their evaluation is complete.     Mickie Hillier, PA-C 06/05/21 1312

## 2021-06-05 NOTE — ED Notes (Signed)
Patient advised to stay despite PCP telling her that it would be okay to leave. IV taken out.

## 2021-06-05 NOTE — Telephone Encounter (Signed)
I spoke with her husband earlier (see other telephone encounter), also called again at 629pm and they have arrived at home, picked up meds and will take the [place], again reviewed s/s and return precautions to ER/911 in event of SOB, CP, dizziness or any signs of stroke / weakness. They have cardiology scheduled Monday at 9:40AM in [place]. I received verbal permission to speak about today's events with their daugther Ebony Hail, I tried to return her call at the number listed here but call could not be completed with this number

## 2021-06-05 NOTE — Progress Notes (Unsigned)
06/05/21 R foot double arthrodesis, Lapidus, hammertoe correction 2/3

## 2021-06-05 NOTE — Telephone Encounter (Addendum)
Spoke with patient and her husband AP by telephone and reviewed EKG, discussed with ER triage PA that with her being currently in sinus again she likely would be discharged home and have outpatient cardiology follow-up.  Current wait time in the ER is 12 to 20 hours, I think it be in her best interest to return home start her Xarelto for prophylaxis and be able to recuperate from surgery.  I advised them if she develops any symptoms such as chest pain shortness of breath lightheadedness rapid heart rate or changes in blood pressure to call 911 or go to ER immediately.  They will check her blood pressure and pulse twice daily.  I have also arrange cardiology follow-up at 9:40 AM on Monday, 06/08/2021 with Dr. Garen Lah in [facility].  They are agreeable with this plan  Lanae Crumbly, DPM 06/05/2021

## 2021-06-05 NOTE — ED Triage Notes (Addendum)
EMS stated, pt was I the middle of foot  surgery and went into A-Fib given meds for the A-Fib. When we EMS got there she was in sinus brady.  20g IV

## 2021-06-08 ENCOUNTER — Encounter: Payer: Self-pay | Admitting: Cardiology

## 2021-06-08 ENCOUNTER — Telehealth: Payer: Self-pay | Admitting: *Deleted

## 2021-06-08 ENCOUNTER — Ambulatory Visit (INDEPENDENT_AMBULATORY_CARE_PROVIDER_SITE_OTHER): Payer: Medicare PPO

## 2021-06-08 ENCOUNTER — Ambulatory Visit: Payer: Medicare PPO | Admitting: Cardiology

## 2021-06-08 ENCOUNTER — Other Ambulatory Visit: Payer: Self-pay

## 2021-06-08 VITALS — BP 110/58 | HR 59 | Ht 61.5 in | Wt 164.0 lb

## 2021-06-08 DIAGNOSIS — I480 Paroxysmal atrial fibrillation: Secondary | ICD-10-CM

## 2021-06-08 DIAGNOSIS — E7800 Pure hypercholesterolemia, unspecified: Secondary | ICD-10-CM

## 2021-06-08 DIAGNOSIS — I10 Essential (primary) hypertension: Secondary | ICD-10-CM | POA: Diagnosis not present

## 2021-06-08 MED ORDER — RIVAROXABAN 20 MG PO TABS: 20.0000 mg | ORAL_TABLET | Freq: Every day | ORAL | 5 refills | Status: DC

## 2021-06-08 NOTE — Telephone Encounter (Signed)
[place] w/ [place] is calling for any documented EKG's done for this patient. Please advise.

## 2021-06-08 NOTE — Telephone Encounter (Signed)
I returned call to [place] and she was unavailable, left message with front desk that we do not have any other EKG readings for patient.

## 2021-06-08 NOTE — Patient Instructions (Signed)
Medication Instructions:   Your physician has recommended you make the following change in your medication:    INCREASE your Xarelto to 20 MG once a day.   *If you need a refill on your cardiac medications before your next appointment, please call your pharmacy*   Lab Work:  None Ordered  If you have labs (blood work) drawn today and your tests are completely normal, you will receive your results only by: [place] (if you have MyChart) OR A paper copy in the mail If you have any lab test that is abnormal or we need to change your treatment, we will call you to review the results.   Testing/Procedures:  Your physician has requested that you have an echocardiogram. Echocardiography is a painless test that uses sound waves to create images of your heart. It provides your doctor with information about the size and shape of your heart and how well your hearts chambers and valves are working. This procedure takes approximately one hour. There are no restrictions for this procedure.  2.   Your physician has recommended that you wear a Zio XT monitor for 2 weeks.  This monitor is a medical device that records the hearts electrical activity. Doctors most often use these monitors to diagnose arrhythmias. Arrhythmias are problems with the speed or rhythm of the heartbeat. The monitor is a small device applied to your chest. You can wear one while you do your normal daily activities. While wearing this monitor if you have any symptoms to push the button and record what you felt. Once you have worn this monitor for the period of time provider prescribed (Usually 14 days), you will return the monitor device in the postage paid box. Once it is returned they will download the data collected and provide [place] with a report which the provider will then review and we will call you with those results. Important tips:  Avoid showering during the first 24 hours of wearing the monitor. Avoid excessive  sweating to help maximize wear time. Do not submerge the device, no hot tubs, and no swimming pools. Keep any lotions or oils away from the patch. After 24 hours you may shower with the patch on. Take brief showers with your back facing the shower head.  Do not remove patch once it has been placed because that will interrupt data and decrease adhesive wear time. Push the button when you have any symptoms and write down what you were feeling. Once you have completed wearing your monitor, remove and place into box which has postage paid and place in your outgoing mailbox.  If for some reason you have misplaced your box then call our office and we can provide another box and/or mail it off for you.     Follow-Up: At [facility], you and your health needs are our priority.  As part of our continuing mission to provide you with exceptional heart care, we have created designated Provider Care Teams.  These Care Teams include your primary Cardiologist (physician) and Advanced Practice Providers (APPs -  Physician Assistants and Nurse Practitioners) who all work together to provide you with the care you need, when you need it.  We recommend signing up for the patient portal called "MyChart".  Sign up information is provided on this After Visit Summary.  MyChart is used to connect with patients for Virtual Visits (Telemedicine).  Patients are able to view lab/test results, encounter notes, upcoming appointments, etc.  Non-urgent messages can be sent to  your provider as well.   To learn more about what you can do with MyChart, go to [email or web address].    Your next appointment:   6 week(s)  The format for your next appointment:   In Person  Provider:   You may see Kate Sable, MD or one of the following Advanced Practice Providers on your designated Care Team:   Murray Hodgkins, NP Christell Faith, PA-C Cadence Kathlen Mody, [place]    Other Instructions

## 2021-06-08 NOTE — Addendum Note (Signed)
Addended by: Kavin Leech on: 06/08/2021 02:00 PM   Modules accepted: Orders

## 2021-06-08 NOTE — Telephone Encounter (Signed)
They should call [place] to see if they have any tracings they keep from the PACU. The EKG done at the ER is available in Epic

## 2021-06-08 NOTE — Telephone Encounter (Signed)
LMTCB

## 2021-06-08 NOTE — Progress Notes (Signed)
Cardiology Office Note:    Date:  06/08/2021   ID:  Kathy Long, DOB [birth year], MRN [id number]  PCP:  [name], MD   [facility] Providers Cardiologist:  None     Referring MD: [name], MD   Chief Complaint  Patient presents with   New Patient (Initial Visit)    Referred by Dr. Sherryle Lis new onset Afib. Meds reviewed verbally with patient.     History of Present Illness:    Kathy Long is a 71 y.o. female with a hx of hypertension, hyperlipidemia who presents due to atrial fibrillation.  Patient was undergoing foot surgery for hammertoe correction 06/05/2021 when she went into atrial fibrillation.  Surgery was stopped, patient moved to the ED for further evaluation.  Denies dizziness, chest pain, shortness of breath.  Denies any previous history of atrial fibrillation.  In the ED, EKG showed normal sinus rhythm.  Patient was started on Xarelto 10 mg daily.  She has an Haematologist in her right foot, has no concerns at this time.  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   Anxiety    Arthritis    lower back, hands   Cancer (HCC)    melanoma, shoulder   Chronic kidney disease    Depression    GERD (gastroesophageal reflux disease)    Hyperlipidemia    Hypertension    Postoperative bile leak    Sleep apnea    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     Past Surgical History:  Procedure Laterality Date   ANKLE FRACTURE SURGERY Left    APPENDECTOMY  2010   BACK SURGERY     disectomy lumbar, scar tissue   BREAST CYST EXCISION Right 1970   axilla   buninectomy Bilateral    CATARACT EXTRACTION W/PHACO Left 04/12/2018   Procedure: CATARACT EXTRACTION PHACO AND INTRAOCULAR LENS PLACEMENT ([place])  LEFT TORIC LENS;  Surgeon: Leandrew Koyanagi, MD;  Location: [facility];  Service: Ophthalmology;  Laterality: Left;   CATARACT EXTRACTION W/PHACO Right 05/10/2018   Procedure: CATARACT EXTRACTION PHACO AND INTRAOCULAR LENS  PLACEMENT ([place])  RIGHT TORIC LENS;  Surgeon: Leandrew Koyanagi, MD;  Location: [facility];  Service: Ophthalmology;  Laterality: Right;  DIABETIC   CHOLECYSTECTOMY N/A 07/05/2017   Procedure: LAPAROSCOPIC CHOLECYSTECTOMY WITH INTRAOPERATIVE CHOLANGIOGRAM;  Surgeon: Robert Bellow, MD;  Location: [facility];  Service: General;  Laterality: N/A;   COLONOSCOPY     2009 and color gaurd in 2018   COLONOSCOPY N/A 03/25/2020   Procedure: COLONOSCOPY;  Surgeon: [name], MD;  Location: [facility];  Service: Endoscopy;  Laterality: N/A;   DRUG INDUCED ENDOSCOPY N/A 05/06/2021   Procedure: DRUG INDUCED SLEEP ENDOSCOPY;  Surgeon: Melida Quitter, MD;  Location: [facility];  Service: ENT;  Laterality: N/A;   ERCP N/A 07/12/2017   Procedure: ENDOSCOPIC RETROGRADE CHOLANGIOPANCREATOGRAPHY (ERCP);  Surgeon: Lucilla Lame, MD;  Location: [facility]COPY;  Service: Endoscopy;  Laterality: N/A;   ERCP N/A 10/04/2017   Procedure: ENDOSCOPIC RETROGRADE CHOLANGIOPANCREATOGRAPHY (ERCP);  Surgeon: Lucilla Lame, MD;  Location: [facility] ENDOSCOPY;  Service: Endoscopy;  Laterality: N/A;   FOOT SURGERY     x 2   INCISIONAL HERNIA REPAIR N/A 12/04/2020   Procedure: VENTRAL INCISIONAL HERNIA REPAIR WITH MESH;  Surgeon: Armandina Gemma, MD;  Location: [facility];  Service: General;  Laterality: N/A;   MELANOMA EXCISION Left 2000   PALATOPLASTY N/A 04/08/2015  Procedure: PALATOPLASTY;  Surgeon: Beverly Gust, MD;  Location: [facility];  Service: ENT;  Laterality: N/A;   TONSILLECTOMY N/A 04/08/2015   Procedure: TONSILLECTOMY;  Surgeon: Beverly Gust, MD;  Location: [facility];  Service: ENT;  Laterality: N/A;   TUBAL LIGATION      Current Medications: Current Meds  Medication Sig   acetaminophen (TYLENOL) 500 MG tablet Take 2 tablets (1,000 mg total) by mouth every 6 (six) hours as needed for up to 14 days (pain).   amLODipine (NORVASC) 5 MG tablet TAKE 1 TABLET BY MOUTH DAILY    Cholecalciferol (VITAMIN D3) 75 MCG (3000 UT) TABS Take 6,000 Units by mouth daily.   citalopram (CELEXA) 20 MG tablet TAKE 1 TABLET BY MOUTH DAILY   clobetasol (TEMOVATE) 0.05 % external solution APPLY DAILY UNTIL RASH IS CLEAR   Coenzyme Q10 100 MG capsule Take 100 mg by mouth daily.   gabapentin (NEURONTIN) 300 MG capsule Take 1 capsule (300 mg total) by mouth 3 (three) times daily for 7 days.   guaiFENesin-codeine (CHERATUSSIN AC) 100-10 MG/5ML syrup Take 5 mLs by mouth 3 (three) times daily as needed for cough.   melatonin 5 MG TABS Take 5 mg by mouth at bedtime.   metoprolol succinate (TOPROL-XL) 25 MG 24 hr tablet TAKE 1 TABLET BY MOUTH WITH OR IMMEDIATLEY FOLLOWING A MEAL EVERY DAY   omeprazole (PRILOSEC) 40 MG capsule TAKE 1 CAPSULE BY MOUTH ONCE DAILY   oxyCODONE (OXY IR/ROXICODONE) 5 MG immediate release tablet Take 1 tablet (5 mg total) by mouth every 4 (four) hours as needed for up to 7 days for severe pain.   rosuvastatin (CRESTOR) 10 MG tablet TAKE ONE TABLET BY MOUTH EVERY DAY   tirzepatide (MOUNJARO) 2.5 MG/0.5ML Pen Inject 2.5 mg into the skin once a week.   vitamin C (ASCORBIC ACID) 500 MG tablet Take 500 mg by mouth daily.   [DISCONTINUED] rivaroxaban (XARELTO) 10 MG TABS tablet Take 1 tablet (10 mg total) by mouth daily.     Allergies:   Morphine and related, Tramadol, and Tape   Social History   Socioeconomic History   Marital status: Married    Spouse name: Not on file   Number of children: Not on file   Years of education: 83 , PhD   Highest education level: Not on file  Occupational History   Occupation: Optometrist  Tobacco Use   Smoking status: Never   Smokeless tobacco: Never   Tobacco comments:    smoked socially in college  Vaping Use   Vaping Use: Never used  Substance and Sexual Activity   Alcohol use: Yes    Alcohol/week: 0.0 standard drinks    Comment: occ - may have 1 glass wine/month   Drug use: No   Sexual activity: Not on file  Other  Topics Concern   Not on file  Social History Narrative   Not on file   Social Determinants of Health   Financial Resource Strain: Not on file  Food Insecurity: Not on file  Transportation Needs: Not on file  Physical Activity: Not on file  Stress: Not on file  Social Connections: Not on file     Family History: The patient's family history includes Cancer (age of onset: 31) in her sister. There is no history of Breast cancer.  ROS:   Please see the history of present illness.     All other systems reviewed and are negative.  EKGs/Labs/Other Studies Reviewed:    The following studies  were reviewed today:   EKG:  EKG is  ordered today.  The ekg ordered today demonstrates sinus bradycardia, heart rate 59  Recent Labs: 07/15/2020: ALT 23 12/02/2020: Hemoglobin 13.7; Platelets 217 04/30/2021: BUN 26; Creatinine, Ser 1.30; Potassium 4.1; Sodium 143  Recent Lipid Panel    Component Value Date/Time   CHOL 157 12/10/2019 1439   TRIG 149.0 12/10/2019 1439   HDL 51.00 12/10/2019 1439   CHOLHDL 3 12/10/2019 1439   VLDL 29.8 12/10/2019 1439   LDLCALC 77 12/10/2019 1439     Risk Assessment/Calculations:          Physical Exam:    VS:  BP (!) 110/58 (BP Location: Right Arm, Patient Position: Sitting, Cuff Size: Normal)    Pulse (!) 59    Ht 5' 1.5" (1.562 m)    Wt 164 lb (74.4 kg)    SpO2 94%    BMI 30.49 kg/m     Wt Readings from Last 3 Encounters:  06/08/21 164 lb (74.4 kg)  05/12/21 169 lb (76.7 kg)  05/11/21 169 lb 3.2 oz (76.7 kg)     GEN:  Well nourished, well developed in no acute distress HEENT: Normal NECK: No JVD; No carotid bruits LYMPHATICS: No lymphadenopathy CARDIAC: RRR, no murmurs, rubs, gallops RESPIRATORY:  Clear to auscultation without rales, wheezing or rhonchi  ABDOMEN: Soft, non-tender, non-distended MUSCULOSKELETAL:  No edema; right foot dressing noted SKIN: Warm and dry NEUROLOGIC:  Alert and oriented x 3 PSYCHIATRIC:  Normal affect    ASSESSMENT:    1. Paroxysmal atrial fibrillation (HCC)   2. Primary hypertension   3. Pure hypercholesterolemia    PLAN:    In order of problems listed above:  Paroxysmal atrial fibrillation, CHA2DS2-VASc score 3(age, htn, gender).  Currently in sinus rhythm.  Obtain EKG from podiatry office to confirm A. fib.  Get echocardiogram.  Increase Xarelto to appropriate dose of 20 mg daily.  Place cardiac monitor to evaluate any A. fib recurrence.  It is possible patient have brief runs of A. fib secondary to surgical procedure stress or anesthetic agents. Hypertension, BP controlled.  Continue Toprol-XL, Norvasc Hyperlipidemia, cholesterol controlled continue Crestor.     follow-up in 6 weeks  Medication Adjustments/Labs and Tests Ordered: Current medicines are reviewed at length with the patient today.  Concerns regarding medicines are outlined above.  Orders Placed This Encounter  Procedures   LONG TERM MONITOR (3-14 DAYS)   EKG 12-Lead   ECHOCARDIOGRAM COMPLETE   Meds ordered this encounter  Medications   rivaroxaban (XARELTO) 20 MG TABS tablet    Sig: Take 1 tablet (20 mg total) by mouth daily.    Dispense:  30 tablet    Refill:  5    Patient Instructions  Medication Instructions:   Your physician has recommended you make the following change in your medication:    INCREASE your Xarelto to 20 MG once a day.   *If you need a refill on your cardiac medications before your next appointment, please call your pharmacy*   Lab Work:  None Ordered  If you have labs (blood work) drawn today and your tests are completely normal, you will receive your results only by: [place] (if you have MyChart) OR A paper copy in the mail If you have any lab test that is abnormal or we need to change your treatment, we will call you to review the results.   Testing/Procedures:  Your physician has requested that you have an echocardiogram. Echocardiography is a  painless test  that uses sound waves to create images of your heart. It provides your doctor with information about the size and shape of your heart and how well your hearts chambers and valves are working. This procedure takes approximately one hour. There are no restrictions for this procedure.  2.   Your physician has recommended that you wear a Zio XT monitor for 2 weeks.  This monitor is a medical device that records the hearts electrical activity. Doctors most often use these monitors to diagnose arrhythmias. Arrhythmias are problems with the speed or rhythm of the heartbeat. The monitor is a small device applied to your chest. You can wear one while you do your normal daily activities. While wearing this monitor if you have any symptoms to push the button and record what you felt. Once you have worn this monitor for the period of time provider prescribed (Usually 14 days), you will return the monitor device in the postage paid box. Once it is returned they will download the data collected and provide [place] with a report which the provider will then review and we will call you with those results. Important tips:  Avoid showering during the first 24 hours of wearing the monitor. Avoid excessive sweating to help maximize wear time. Do not submerge the device, no hot tubs, and no swimming pools. Keep any lotions or oils away from the patch. After 24 hours you may shower with the patch on. Take brief showers with your back facing the shower head.  Do not remove patch once it has been placed because that will interrupt data and decrease adhesive wear time. Push the button when you have any symptoms and write down what you were feeling. Once you have completed wearing your monitor, remove and place into box which has postage paid and place in your outgoing mailbox.  If for some reason you have misplaced your box then call our office and we can provide another box and/or mail it off for you.     Follow-Up: At  [facility], you and your health needs are our priority.  As part of our continuing mission to provide you with exceptional heart care, we have created designated Provider Care Teams.  These Care Teams include your primary Cardiologist (physician) and Advanced Practice Providers (APPs -  Physician Assistants and Nurse Practitioners) who all work together to provide you with the care you need, when you need it.  We recommend signing up for the patient portal called "MyChart".  Sign up information is provided on this After Visit Summary.  MyChart is used to connect with patients for Virtual Visits (Telemedicine).  Patients are able to view lab/test results, encounter notes, upcoming appointments, etc.  Non-urgent messages can be sent to your provider as well.   To learn more about what you can do with MyChart, go to [email or web address].    Your next appointment:   6 week(s)  The format for your next appointment:   In Person  Provider:   You may see Kate Sable, MD or one of the following Advanced Practice Providers on your designated Care Team:   Murray Hodgkins, NP Christell Faith, PA-C Cadence Kathlen Mody, [place]    Other Instructions     Signed, Kate Sable, MD  06/08/2021 12:38 PM    [place]

## 2021-06-09 ENCOUNTER — Telehealth: Payer: Self-pay | Admitting: Cardiology

## 2021-06-09 NOTE — Telephone Encounter (Signed)
I called and spoke with Almyra Free at [facility] and she informed me that they do not call the patients anymore, they verify addresses with [employer]PS. She informed me that the monitor should arrive to patient in 2-3 days. She will email me the exact date once she has it.   I called patients spouse back and relayed this information to him. He was grateful for the call back.

## 2021-06-09 NOTE — Telephone Encounter (Signed)
Patient has not received a call from zio to confirm mailing device.    Patient spouse concerned about delay and is aware either nurse or zio rep will call.

## 2021-06-10 ENCOUNTER — Ambulatory Visit (INDEPENDENT_AMBULATORY_CARE_PROVIDER_SITE_OTHER): Payer: Medicare PPO | Admitting: Podiatry

## 2021-06-10 ENCOUNTER — Other Ambulatory Visit: Payer: Self-pay

## 2021-06-10 ENCOUNTER — Ambulatory Visit (INDEPENDENT_AMBULATORY_CARE_PROVIDER_SITE_OTHER): Payer: Medicare PPO

## 2021-06-10 DIAGNOSIS — M21611 Bunion of right foot: Secondary | ICD-10-CM

## 2021-06-10 DIAGNOSIS — M2011 Hallux valgus (acquired), right foot: Secondary | ICD-10-CM

## 2021-06-10 DIAGNOSIS — I480 Paroxysmal atrial fibrillation: Secondary | ICD-10-CM | POA: Diagnosis not present

## 2021-06-10 DIAGNOSIS — Z9889 Other specified postprocedural states: Secondary | ICD-10-CM | POA: Diagnosis not present

## 2021-06-10 DIAGNOSIS — M66871 Spontaneous rupture of other tendons, right ankle and foot: Secondary | ICD-10-CM

## 2021-06-10 DIAGNOSIS — M19279 Secondary osteoarthritis, unspecified ankle and foot: Secondary | ICD-10-CM

## 2021-06-10 NOTE — Telephone Encounter (Signed)
I was emailed the tracking receipt and it showed and expected delivery date of today by 8 PM. Called patients husband and informed him of this. He was grateful for the follow up.

## 2021-06-11 ENCOUNTER — Other Ambulatory Visit: Payer: Self-pay | Admitting: Podiatry

## 2021-06-11 ENCOUNTER — Telehealth: Payer: Self-pay | Admitting: *Deleted

## 2021-06-11 NOTE — Telephone Encounter (Signed)
Sent now if you can please let her know. Thanks

## 2021-06-11 NOTE — Telephone Encounter (Signed)
"  I'm calling for my wife, Kathy Long.  He was going to call in some more Oxycodone and Gabapentin.  I called [facility] and they haven't received anything.  If you could take care of that, I'd really appreciate it.  I know the weekend is coming up and we'd like to have it before then."

## 2021-06-11 NOTE — Telephone Encounter (Signed)
Patient has been notified of her medications

## 2021-06-12 ENCOUNTER — Other Ambulatory Visit: Payer: Self-pay | Admitting: Sports Medicine

## 2021-06-12 ENCOUNTER — Telehealth: Payer: Self-pay | Admitting: Cardiology

## 2021-06-12 MED ORDER — OXYCODONE-ACETAMINOPHEN 10-325 MG PO TABS: 1.0000 | ORAL_TABLET | ORAL | 0 refills | Status: AC | PRN

## 2021-06-12 NOTE — Progress Notes (Signed)
Oxycodone 5 mg is on backorder change prescription to Percocet meanwhile on behalf of of Dr. Sherryle Lis

## 2021-06-12 NOTE — Telephone Encounter (Signed)
[facility]),is calling because there prescription for oxycodone-5 mg is on manufacturer back order for 1 month. Does the doctor want to switch to something else? Please advise.

## 2021-06-12 NOTE — Telephone Encounter (Signed)
Thank you :)

## 2021-06-12 NOTE — Progress Notes (Signed)
°  Subjective:  Patient ID: Kathy Long, female    DOB: [birth year],  MRN: [id number]  Chief Complaint  Patient presents with   Routine Post Op    POV #1 DOS 06/05/2020 FUSION OF SUBTARLAR & TALONAVICULAR JOINTS, BONE GRAFT FROM LEG  RT FOOT, PT AND FDL TENODESIS      71 y.o. female returns for post-op check.  Overall doing well.  The splint caused some discomfort.  She did see cardiology yesterday and began a higher dose of Xarelto.  She is also going to be wearing a Holter monitor.  Denies chest pain shortness of breath.  Review of Systems: Negative except as noted in the HPI. Denies N/V/F/Ch.   Objective:  There were no vitals filed for this visit. There is no height or weight on file to calculate BMI. Constitutional Well developed. Well nourished.  Vascular Foot warm and well perfused. Capillary refill normal to all digits.  Calf is soft and supple, no posterior calf or knee pain, negative Homans' sign  Neurologic Normal speech. Oriented to person, place, and time. Epicritic sensation to light touch grossly present bilaterally.  Dermatologic Skin healing well without signs of infection. Skin edges well coapted without signs of infection.  There is a small hemorrhagic bulla on the lateral ankle  Orthopedic: Tenderness to palpation noted about the surgical site.  Moderate edema and bruising   Multiple view plain film radiographs: Status post subtalar talonavicular arthrodesis, position and hardware intact and equivalent to immediate postoperative films, ghost tract in tibia from bone graft present Assessment:   1. 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 Hallux valgus with bunions, right   3. S/P foot surgery, right   4. Secondary osteoarthritis of talonavicular joint    Plan:  Patient was evaluated and treated and all questions answered.  S/p foot surgery right -Progressing as expected post-operatively. -XR: As noted above -WB Status: NWB in short leg  cast which was applied today -Sutures: Plan remove next visit. -Hemorrhagic bulla was lanced after sterile Betadine prep and Betadine ointment was applied to this and all incisions prior to redressing -Reviewed the radiographs with the patient and her husband.  We discussed further treatment for the bunions and hammertoes.  We will decide this at a later date when she is able to walk on the foot and we can see what residual issues and deformity she has to see if they are symptomatic.  Given her cardiac risk would be hesitant to do this unless severe. Return in about 2 weeks (around 06/24/2021) for suture removal, post op (no x-rays).

## 2021-06-12 NOTE — Telephone Encounter (Signed)
Patient spouse calling States patient started Xarelto this week and today her nose starting bleeding They were able to get it stopped but just want to discuss if this is normal Please call

## 2021-06-14 ENCOUNTER — Encounter: Payer: Self-pay | Admitting: Internal Medicine

## 2021-06-15 NOTE — Telephone Encounter (Signed)
Called patient and reviewed with her that nosebleeds can happen while on a blood thinner. She informed me that her nosebleed lasted 10 mins and then stopped. I advised that if she were to have more than 5 nosebleeds in a week, or if she was unable to stop the nosebleed after 10-15 mins, then she should notify us.  Patient verbalized understanding and agreed with plan.

## 2021-06-16 NOTE — Telephone Encounter (Signed)
LVM for patient to call back.   Murle Otting,cma  

## 2021-06-17 ENCOUNTER — Telehealth: Payer: Self-pay | Admitting: *Deleted

## 2021-06-17 DIAGNOSIS — R2689 Other abnormalities of gait and mobility: Secondary | ICD-10-CM

## 2021-06-17 NOTE — Telephone Encounter (Signed)
[facility] w/ Mcarthur Rossetti [phone]. [phone])DT calling to request home healthcare for patient,has fallen twice since surgery. The patient thought at first that she did not need assistance at home but may need it also nurse has been trying to contact patient, no answer,going straight to voicemail.  Called the patient and she and daughter agreed that home health and physical therapy is needed at this point. Please advise.

## 2021-06-18 NOTE — Telephone Encounter (Signed)
Spoke with pt and she is aware of appt date and time. 

## 2021-06-22 NOTE — Telephone Encounter (Signed)
faxed the orders to [facility] also call [facility], could not reach but did leave a message of this and for her to contact back  w/ her fax number if she would like a copy.

## 2021-06-23 ENCOUNTER — Ambulatory Visit (INDEPENDENT_AMBULATORY_CARE_PROVIDER_SITE_OTHER): Payer: Medicare PPO

## 2021-06-23 ENCOUNTER — Other Ambulatory Visit: Payer: Self-pay

## 2021-06-23 DIAGNOSIS — I480 Paroxysmal atrial fibrillation: Secondary | ICD-10-CM

## 2021-06-23 LAB — ECHOCARDIOGRAM COMPLETE
AR max vel: 2.28 cm2
AV Area VTI: 2.64 cm2
AV Area mean vel: 2.35 cm2
AV Mean grad: 3 mmHg
AV Peak grad: 7 mmHg
Ao pk vel: 1.32 m/s
Area-P 1/2: 2.55 cm2
Calc EF: 55 %
S' Lateral: 2.9 cm
Single Plane A2C EF: 55.8 %
Single Plane A4C EF: 55.9 %

## 2021-06-24 ENCOUNTER — Ambulatory Visit (INDEPENDENT_AMBULATORY_CARE_PROVIDER_SITE_OTHER): Payer: Medicare PPO | Admitting: Podiatry

## 2021-06-24 ENCOUNTER — Encounter: Payer: Self-pay | Admitting: Internal Medicine

## 2021-06-24 DIAGNOSIS — Z9889 Other specified postprocedural states: Secondary | ICD-10-CM

## 2021-06-24 DIAGNOSIS — M2011 Hallux valgus (acquired), right foot: Secondary | ICD-10-CM

## 2021-06-24 DIAGNOSIS — M21611 Bunion of right foot: Secondary | ICD-10-CM

## 2021-06-24 DIAGNOSIS — R2689 Other abnormalities of gait and mobility: Secondary | ICD-10-CM

## 2021-06-24 DIAGNOSIS — M66871 Spontaneous rupture of other tendons, right ankle and foot: Secondary | ICD-10-CM

## 2021-06-24 NOTE — Progress Notes (Signed)
°  Subjective:  Patient ID: Kathy Long, female    DOB: 12-26-1950,  MRN: [id number]  Chief Complaint  Patient presents with   Routine Post Op    POV #1 DOS 06/05/2020 FUSION OF SUBTARLAR & TALONAVICULAR JOINTS, BONE GRAFT FROM LEG  RT FOOT, PT AND FDL TENODESIS      71 y.o. female returns for post-op check.  She is doing well.  She did have a mechanical fall at home that her husband was able to help her with.  Had small cut on her head that stopped bleeding.  No loss of consciousness.  Did put weight on the cast but has not had any persistent pain.  She is only taking some Tylenol occasionally now  Review of Systems: Negative except as noted in the HPI. Denies N/V/F/Ch.   Objective:  There were no vitals filed for this visit. There is no height or weight on file to calculate BMI. Constitutional Well developed. Well nourished.  Vascular Foot warm and well perfused. Capillary refill normal to all digits.  Calf is soft and supple, no posterior calf or knee pain, negative Homans' sign  Neurologic Normal speech. Oriented to person, place, and time. Epicritic sensation to light touch grossly present bilaterally.  Dermatologic Incisions are well-healed not hypertrophic, hemorrhagic bulla has resolved.  No new ecchymosis pain edema or swelling.  Edema is proved quite a bit.  Orthopedic: She has minimal tenderness around the hindfoot from the surgical site   Multiple view plain film radiographs: Status post subtalar talonavicular arthrodesis, position and hardware intact and equivalent to immediate postoperative films, ghost tract in tibia from bone graft present Assessment:   1. Inability to bear weight   2. Hallux valgus with bunions, right   3. S/P foot surgery, right   4. 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  S/p foot surgery right -Overall doing okay.  She did have some issues with mobility  and had a fall in her home.  I have sent a [facility] to [facility] and I will have my office follow-up on this today to see what the status of it is is I do think she needs a home health aide and physical therapy due to her impaired mobility and issues. -Pain is improved quite a bit she is no longer on narcotic medications.  Only taking Tylenol occasionally.  Despite the fall did not take new x-rays today as she is not having any symptoms from the fall and her foot and ankle. -Sutures removed -Well-padded below-knee cast reapplied today, we will plan to remove in 3 weeks and transition to nonweightbearing in CAM boot can start bathing at that point do not put weight on it  Return in about 3 weeks (around 07/15/2021) for post op (new x-rays).

## 2021-06-24 NOTE — Telephone Encounter (Signed)
Called [facility], short staffed this week, try sending again next week, a lot of the employess out sick.

## 2021-06-25 ENCOUNTER — Encounter: Payer: Self-pay | Admitting: Internal Medicine

## 2021-06-25 DIAGNOSIS — I480 Paroxysmal atrial fibrillation: Secondary | ICD-10-CM

## 2021-06-26 DIAGNOSIS — I480 Paroxysmal atrial fibrillation: Secondary | ICD-10-CM | POA: Diagnosis not present

## 2021-06-26 NOTE — Telephone Encounter (Signed)
I have pended the referral for Dr. Rockey Situ.

## 2021-07-01 ENCOUNTER — Telehealth: Payer: Self-pay

## 2021-07-01 NOTE — Telephone Encounter (Signed)
The patient has been notified of the result via VM per DPR on file. A Encouraged patient to call back with any questions or concerns.  

## 2021-07-06 ENCOUNTER — Ambulatory Visit: Payer: Medicare PPO | Admitting: Cardiovascular Disease

## 2021-07-06 ENCOUNTER — Other Ambulatory Visit: Payer: Self-pay

## 2021-07-06 ENCOUNTER — Encounter: Payer: Self-pay | Admitting: Cardiovascular Disease

## 2021-07-06 VITALS — BP 110/64 | HR 62 | Ht 62.0 in | Wt 160.0 lb

## 2021-07-06 DIAGNOSIS — I700 Atherosclerosis of aorta: Secondary | ICD-10-CM | POA: Diagnosis not present

## 2021-07-06 DIAGNOSIS — I480 Paroxysmal atrial fibrillation: Secondary | ICD-10-CM | POA: Diagnosis not present

## 2021-07-06 MED ORDER — FLECAINIDE ACETATE 50 MG PO TABS: 50.0000 mg | ORAL_TABLET | Freq: Two times a day (BID) | ORAL | 3 refills | Status: DC | PRN

## 2021-07-06 MED ORDER — APIXABAN 5 MG PO TABS: 5.0000 mg | ORAL_TABLET | Freq: Two times a day (BID) | ORAL | 3 refills | Status: DC

## 2021-07-06 MED ORDER — METOPROLOL SUCCINATE ER 50 MG PO TB24: 50.0000 mg | ORAL_TABLET | Freq: Every day | ORAL | 3 refills | Status: DC

## 2021-07-06 NOTE — Patient Instructions (Addendum)
Try to invest in Apple watch to track rhythm strip and HR  Medication Instructions:  Please Start eliquis 5 mg twice a day when you run out of xarelto Can try to register coupon Free 30-day trial  $10 co-pay card  Please Start Metoprolol succinate 50 mg once a day  Please Start Flecainide 50 mg twice a day as needed for atrial fib Ok to take a repeat dose after 1-2 hours if not back in normal rhythm  4.   Please Stop  amlodipine  If you need a refill on your cardiac medications before your next appointment, please call your pharmacy.   Lab work: No new labs needed  Testing/Procedures: No new testing needed  Follow-Up: At [facility], you and your health needs are our priority.  As part of our continuing mission to provide you with exceptional heart care, we have created designated Provider Care Teams.  These Care Teams include your primary Cardiologist (physician) and Advanced Practice Providers (APPs -  Physician Assistants and Nurse Practitioners) who all work together to provide you with the care you need, when you need it.  You will need a follow up appointment in 3 months Dr. Quentin Ore 2/8 at 09:40 am  Providers on your designated Care Team:   Murray Hodgkins, NP Christell Faith, PA-C Cadence Kathlen Mody, [place]  COVID-19 Vaccine Information can be found at: [email or web address].uk For questions related to vaccine distribution or appointments, please email [email or web address] or call 2266102648.

## 2021-07-06 NOTE — Progress Notes (Signed)
Cardiology Office Note  Date:  07/06/2021   ID:  Kathy Long, DOB 07-16-1950, MRN [id number]  PCP:  [name], MD   Chief Complaint  Patient presents with   Establish Care    Discuss Echo and recent Zio monitor results. Medications reviewed by the patient verbally.     HPI:  Kathy Long is a 71 y.o. female with a hx of  hypertension,  Hyperlipidemia who presents due to paroxysmal atrial fibrillation.  Recent records reviewed foot surgery for hammertoe correction 06/05/2021  went into atrial fibrillation.   Surgery was stopped,  In the ED, EKG showed normal sinus rhythm.   started on Xarelto 10 mg daily.  She has an Haematologist in her right foot, has no concerns at this time.  On metoprolol succinate 25 daily   CHA2DS2-VASc score 3(age, htn, gender). Seen by cardiology Xarelto was increased up to 20 June 08, 2021 Monitor was placed  Review of EKG June 05, 2021 shows normal sinus rhythm  Echocardiogram reviewed ejection fraction 60 to 38%, grade 1 diastolic dysfunction, normal right heart pressures  Weight coming down On weight loss shot, some mild side effects  Has  boot on right foot, would hope to avoid further surgery  EKG personally reviewed by myself on todays visit Normal sinus rhythm rate 71 bpm nonspecific T wave abnormality V1 through V4   Zio reviewed Paroxysmal atrial fibrillation noted on cardiac monitor.  26% burden,  Patient had a min HR of 42 bpm, max HR of 176 bpm, and avg HR of 70 bpm. Predominant underlying rhythm was Sinus Rhythm.   1 run of Ventricular Tachycardia occurred lasting 18 beats with a max rate of 162 bpm (avg 152 bpm).   21 Supraventricular Tachycardia runs occurred, the run with the fastest interval lasting 9 beats with a max rate of 139 bpm, the longest lasting 14 beats with an avg rate of 109 bpm.   Atrial Fibrillation occurred (26% burden), ranging from 57-176 bpm (avg of 89 bpm), the longest lasting 10 hours 21 mins  with an avg rate of 84 bpm.  PMH:   has a past medical history of Acoustic neuroma ([place]), Anxiety, Arthritis, Cancer ([place]), Chronic kidney disease, Depression, GERD (gastroesophageal reflux disease), Hyperlipidemia, Hypertension, Postoperative bile leak, Sleep apnea, and 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.  PSH:    Past Surgical History:  Procedure Laterality Date   ANKLE FRACTURE SURGERY Left    APPENDECTOMY  2010   BACK SURGERY     disectomy lumbar, scar tissue   BREAST CYST EXCISION Right 1970   axilla   buninectomy Bilateral    CATARACT EXTRACTION W/PHACO Left 04/12/2018   Procedure: CATARACT EXTRACTION PHACO AND INTRAOCULAR LENS PLACEMENT ([place])  LEFT TORIC LENS;  Surgeon: Leandrew Koyanagi, MD;  Location: [place];  Service: Ophthalmology;  Laterality: Left;   CATARACT EXTRACTION W/PHACO Right 05/10/2018   Procedure: CATARACT EXTRACTION PHACO AND INTRAOCULAR LENS PLACEMENT ([place])  RIGHT TORIC LENS;  Surgeon: Leandrew Koyanagi, MD;  Location: [facility];  Service: Ophthalmology;  Laterality: Right;  DIABETIC   CHOLECYSTECTOMY N/A 07/05/2017   Procedure: LAPAROSCOPIC CHOLECYSTECTOMY WITH INTRAOPERATIVE CHOLANGIOGRAM;  Surgeon: Robert Bellow, MD;  Location: [facility];  Service: General;  Laterality: N/A;   COLONOSCOPY     2009 and color gaurd in 2018   COLONOSCOPY N/A 03/25/2020   Procedure: COLONOSCOPY;  Surgeon: [name], MD;  Location: [facility];  Service: Endoscopy;  Laterality: N/A;  DRUG INDUCED ENDOSCOPY N/A 05/06/2021   Procedure: DRUG INDUCED SLEEP ENDOSCOPY;  Surgeon: Melida Quitter, MD;  Location: [place];  Service: ENT;  Laterality: N/A;   ERCP N/A 07/12/2017   Procedure: ENDOSCOPIC RETROGRADE CHOLANGIOPANCREATOGRAPHY (ERCP);  Surgeon: Lucilla Lame, MD;  Location: [facility];  Service: Endoscopy;  Laterality: N/A;   ERCP N/A 10/04/2017   Procedure: ENDOSCOPIC RETROGRADE CHOLANGIOPANCREATOGRAPHY (ERCP);  Surgeon:  Lucilla Lame, MD;  Location: [facility];  Service: Endoscopy;  Laterality: N/A;   FOOT SURGERY     x 2   INCISIONAL HERNIA REPAIR N/A 12/04/2020   Procedure: VENTRAL INCISIONAL HERNIA REPAIR WITH MESH;  Surgeon: Armandina Gemma, MD;  Location: [facility];  Service: General;  Laterality: N/A;   MELANOMA EXCISION Left 2000   PALATOPLASTY N/A 04/08/2015   Procedure: PALATOPLASTY;  Surgeon: Beverly Gust, MD;  Location: [facility];  Service: ENT;  Laterality: N/A;   TONSILLECTOMY N/A 04/08/2015   Procedure: TONSILLECTOMY;  Surgeon: Beverly Gust, MD;  Location: [facility];  Service: ENT;  Laterality: N/A;   TUBAL LIGATION      Current Outpatient Medications  Medication Sig Dispense Refill   apixaban (ELIQUIS) 5 MG TABS tablet Take 1 tablet (5 mg total) by mouth 2 (two) times daily. 180 tablet 3   Cholecalciferol (VITAMIN D3) 75 MCG (3000 UT) TABS Take 6,000 Units by mouth daily.     citalopram (CELEXA) 20 MG tablet TAKE 1 TABLET BY MOUTH DAILY 90 tablet 1   clobetasol (TEMOVATE) 0.05 % external solution APPLY DAILY UNTIL RASH IS CLEAR 50 mL 0   Coenzyme Q10 100 MG capsule Take 100 mg by mouth daily.     flecainide (TAMBOCOR) 50 MG tablet Take 1 tablet (50 mg total) by mouth 2 (two) times daily as needed. For A-fib 180 tablet 3   melatonin 5 MG TABS Take 5 mg by mouth at bedtime.     omeprazole (PRILOSEC) 40 MG capsule TAKE 1 CAPSULE BY MOUTH ONCE DAILY 90 capsule 1   rosuvastatin (CRESTOR) 10 MG tablet TAKE ONE TABLET BY MOUTH EVERY DAY 90 tablet 1   tirzepatide (MOUNJARO) 2.5 MG/0.5ML Pen Inject 2.5 mg into the skin once a week.     vitamin C (ASCORBIC ACID) 500 MG tablet Take 500 mg by mouth daily.     gabapentin (NEURONTIN) 300 MG capsule TAKE 1 CAPSULE BY MOUTH 3 TIMES DAILY FOR SEVEN DAYS (Patient not taking: Reported on 07/06/2021) 21 capsule 0   guaiFENesin-codeine (CHERATUSSIN AC) 100-10 MG/5ML syrup Take 5 mLs by mouth 3 (three) times daily as needed for cough. (Patient not taking: Reported  on 07/06/2021) 120 mL 0   metoprolol succinate (TOPROL-XL) 50 MG 24 hr tablet Take 1 tablet (50 mg total) by mouth daily. 90 tablet 3   oxyCODONE (OXY IR/ROXICODONE) 5 MG immediate release tablet TAKE ONE TABLET BY MOUTH EVERY 4 HOURS AS NEEDED FOR SEVERE PAIN FOR UP TO 7 DAYS (Patient not taking: Reported on 07/06/2021) 28 tablet 0   No current facility-administered medications for this visit.     Allergies:   Morphine and related, Tramadol, and Tape   Social History:  The patient  reports that she has never smoked. She has never used smokeless tobacco. She reports current alcohol use. She reports that she does not use drugs.   Family History:   family history includes Cancer (age of onset: 49) in her sister.    Review of Systems: Review of Systems  Constitutional: Negative.   HENT: Negative.  Respiratory: Negative.    Cardiovascular: Negative.   Gastrointestinal: Negative.   Musculoskeletal: Negative.   Neurological: Negative.   Psychiatric/Behavioral: Negative.    All other systems reviewed and are negative.   PHYSICAL EXAM: VS:  BP 110/64 (BP Location: Left Arm, Patient Position: Sitting, Cuff Size: Normal)    Pulse 62    Ht 5\' 2"  (1.575 m)    Wt 160 lb (72.6 kg)    SpO2 98%    BMI 29.26 kg/m  , BMI Body mass index is 29.26 kg/m. GEN: Well nourished, well developed, in no acute distress HEENT: normal Neck: no JVD, carotid bruits, or masses Cardiac: RRR; no murmurs, rubs, or gallops,no edema  Respiratory:  clear to auscultation bilaterally, normal work of breathing GI: soft, nontender, nondistended, + BS MS: no deformity or atrophy Skin: warm and dry, no rash Neuro:  Strength and sensation are intact Psych: euthymic mood, full affect   Recent Labs: 07/15/2020: ALT 23 12/02/2020: Hemoglobin 13.7; Platelets 217 04/30/2021: BUN 26; Creatinine, Ser 1.30; Potassium 4.1; Sodium 143    Lipid Panel Lab Results  Component Value Date   CHOL 157 12/10/2019   HDL 51.00  12/10/2019   LDLCALC 77 12/10/2019   TRIG 149.0 12/10/2019      Wt Readings from Last 3 Encounters:  07/06/21 160 lb (72.6 kg)  06/08/21 164 lb (74.4 kg)  05/12/21 169 lb (76.7 kg)     ASSESSMENT AND PLAN:  Problem List Items Addressed This Visit       Cardiology Problems   Aortic atherosclerosis (HCC)   Relevant Medications   metoprolol succinate (TOPROL-XL) 50 MG 24 hr tablet   apixaban (ELIQUIS) 5 MG TABS tablet   flecainide (TAMBOCOR) 50 MG tablet   Other Relevant Orders   EKG 12-Lead   Other Visit Diagnoses     Paroxysmal atrial fibrillation (HCC)    -  Primary   Relevant Medications   metoprolol succinate (TOPROL-XL) 50 MG 24 hr tablet   apixaban (ELIQUIS) 5 MG TABS tablet   flecainide (TAMBOCOR) 50 MG tablet   Other Relevant Orders   EKG 12-Lead      Paroxysmal atrial fibrillation After long discussion, review of monitor, she is willing to change medications, will aim to suppress atrial fibrillation We have recommended that she increase metoprolol succinate up to 50 mg daily -Discussed benefits to Eliquis, will hold the Xarelto and changed to Eliquis 5 twice daily -She will work on a monitoring system at home through [employer] or [employer].  We will look for suppression of her atrial fibrillation For atrial fibrillation episodes that last more than an hour, recommend she take flecainide 50 mg as needed -In general has been asymptomatic from her atrial fibrillation Sister with permanent atrial fibrillation.  Suggested we try to work to avoid long-term persistent or permanent atrial fibrillation -History of sleep apnea but has lost weight, symptoms appear to have improved per husband who presents with her today.  Certainly a risk factor.  Did not tolerate CPAP    Total encounter time more than 40 minutes  Greater than 50% was spent in counseling and coordination of care with the patient    Signed, Esmond Plants, M.D., Ph.D. [place], [place]

## 2021-07-07 ENCOUNTER — Other Ambulatory Visit: Payer: Self-pay

## 2021-07-08 ENCOUNTER — Institutional Professional Consult (permissible substitution): Payer: Medicare PPO | Admitting: Cardiology

## 2021-07-08 ENCOUNTER — Encounter: Payer: Self-pay | Admitting: Cardiovascular Disease

## 2021-07-09 ENCOUNTER — Encounter: Payer: Self-pay | Admitting: Cardiovascular Disease

## 2021-07-10 ENCOUNTER — Encounter: Payer: Self-pay | Admitting: Cardiovascular Disease

## 2021-07-15 ENCOUNTER — Encounter: Payer: Self-pay | Admitting: Podiatry

## 2021-07-15 ENCOUNTER — Ambulatory Visit (INDEPENDENT_AMBULATORY_CARE_PROVIDER_SITE_OTHER): Payer: Medicare PPO | Admitting: Podiatry

## 2021-07-15 ENCOUNTER — Other Ambulatory Visit: Payer: Self-pay

## 2021-07-15 ENCOUNTER — Ambulatory Visit (INDEPENDENT_AMBULATORY_CARE_PROVIDER_SITE_OTHER): Payer: Medicare PPO

## 2021-07-15 DIAGNOSIS — M2011 Hallux valgus (acquired), right foot: Secondary | ICD-10-CM

## 2021-07-15 DIAGNOSIS — M21611 Bunion of right foot: Secondary | ICD-10-CM

## 2021-07-15 DIAGNOSIS — Z9889 Other specified postprocedural states: Secondary | ICD-10-CM

## 2021-07-15 NOTE — Progress Notes (Signed)
°  Subjective:  Patient ID: Kathy Long, female    DOB: 1950-10-21,  MRN: [id number]  Chief Complaint  Patient presents with   Routine Post Op    POV #3 DOS 06/05/2020 FUSION OF SUBTARLAR & TALONAVICULAR JOINTS, BONE GRAFT FROM LEG  RT FOOT, PT AND FDL TENODESIS      71 y.o. female returns for post-op check.  She is doing well.  Not having much pain.  Swelling is improving.  Feels somewhat sore across the top of the foot  Review of Systems: Negative except as noted in the HPI. Denies N/V/F/Ch.   Objective:  There were no vitals filed for this visit. There is no height or weight on file to calculate BMI. Constitutional Well developed. Well nourished.  Vascular Foot warm and well perfused. Capillary refill normal to all digits.  Calf is soft and supple, no posterior calf or knee pain, negative Homans' sign  Neurologic Normal speech. Oriented to person, place, and time. Epicritic sensation to light touch grossly present bilaterally.  Dermatologic Incisions are well-healed not hypertrophic, hemorrhagic bulla has resolved.  Minimal edema.  Minimal pain.  Orthopedic: She has minimal tenderness around the hindfoot from the surgical site   Multiple view plain film radiographs: Status post subtalar talonavicular arthrodesis, position and hardware intact and equivalent to immediate postoperative films, ghost tract in tibia from bone graft present; new films taken today show increasing consolidation across the arthrodesis site Assessment:   1. Hallux valgus with bunions, right   2. S/P foot surgery, right     Plan:  Patient was evaluated and treated and all questions answered.  S/p foot surgery right -Much improved.  Cast was removed today and transition to a tall CAM boot which was dispensed.  She will remain in this for another 2-1/2 weeks nonweightbearing and then may transition to weightbearing as tolerated gradually in the boot only.  I would like her to be in physical therapy at  that point as well and a referral was sent to [facility] physical therapy on [address] in [place] she has an existing relationship with a therapist there.  I will see her back in 6 weeks for new x-rays and hopeful transition to shoe gear at that point.  She will be on Eliquis for the foreseeable future for her A-fib  Return in about 6 weeks (around 08/26/2021) for post op (new x-rays).

## 2021-07-21 ENCOUNTER — Encounter: Payer: Self-pay | Admitting: Podiatry

## 2021-07-23 ENCOUNTER — Encounter: Payer: Self-pay | Admitting: Internal Medicine

## 2021-07-23 ENCOUNTER — Other Ambulatory Visit: Payer: Self-pay

## 2021-07-23 ENCOUNTER — Ambulatory Visit: Payer: Medicare PPO | Admitting: Cardiology

## 2021-07-27 MED ORDER — TIRZEPATIDE 5 MG/0.5ML ~~LOC~~ SOAJ: 5.0000 mg | SUBCUTANEOUS | 0 refills | Status: DC

## 2021-07-27 MED ORDER — ONDANSETRON HCL 4 MG PO TABS: 4.0000 mg | ORAL_TABLET | Freq: Three times a day (TID) | ORAL | 0 refills | Status: DC | PRN

## 2021-07-28 ENCOUNTER — Telehealth: Payer: Self-pay | Admitting: Podiatry

## 2021-07-28 ENCOUNTER — Telehealth: Payer: Self-pay | Admitting: *Deleted

## 2021-07-28 ENCOUNTER — Encounter: Payer: Self-pay | Admitting: Podiatry

## 2021-07-28 NOTE — Telephone Encounter (Signed)
I called and informed the nurse that Dr. Sherryle Lis said all the information needed was in his last note on 07/15/2021.  She said she would let [facility] know.

## 2021-07-28 NOTE — Telephone Encounter (Signed)
Pt is scheduled to be seen by physical therapist tomorrow and he is asking if you have a specific protocol, speical instructions or weight bearing status for this pt.  Also if you could send surgical consult. Fax number is [phone].

## 2021-07-28 NOTE — Telephone Encounter (Signed)
I made several attempts to fax the information to the given fax number without success.

## 2021-07-28 NOTE — Telephone Encounter (Signed)
"  Can you fax protocol, operative report, special instructions and weight bearing status.  She is scheduled for an evaluation tomorrow at 11 am.  He can fax the order, the fax number is [phone]."  I will send the message to Dr. Sherryle Lis and his assistant, Nira Conn."

## 2021-07-29 ENCOUNTER — Ambulatory Visit: Payer: Medicare PPO | Attending: Podiatry

## 2021-07-29 ENCOUNTER — Other Ambulatory Visit: Payer: Self-pay

## 2021-07-29 DIAGNOSIS — R262 Difficulty in walking, not elsewhere classified: Secondary | ICD-10-CM | POA: Insufficient documentation

## 2021-07-29 DIAGNOSIS — M25571 Pain in right ankle and joints of right foot: Secondary | ICD-10-CM | POA: Insufficient documentation

## 2021-07-29 DIAGNOSIS — M6281 Muscle weakness (generalized): Secondary | ICD-10-CM | POA: Diagnosis not present

## 2021-07-29 DIAGNOSIS — Z9889 Other specified postprocedural states: Secondary | ICD-10-CM | POA: Insufficient documentation

## 2021-07-29 NOTE — Therapy (Signed)
[facility] [address] [place], [address] Phone: [phone]   Fax:  [phone]  Physical Therapy Evaluation  Patient Details  Name: Kathy Long MRN: [id number] Date of Birth: 1951-04-27 Referring Provider (PT): Criselda Peaches, [place]   Encounter Date: 07/29/2021   PT End of Session - 07/29/21 1105     Visit Number 1    Number of Visits 25    Date for PT Re-Evaluation 10/22/21    Authorization Type 1    Authorization Time Period of 10 progress report    PT Start Time 1105    PT Stop Time 1158    PT Time Calculation (min) 53 min    Equipment Utilized During Treatment --   wc, rw   Activity Tolerance Patient tolerated treatment well    Behavior During Therapy WFL for tasks assessed/performed             Past Medical History:  Diagnosis Date   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   Anxiety    Arthritis    lower back, hands   Cancer ([place])    melanoma, shoulder   Chronic kidney disease    Depression    GERD (gastroesophageal reflux disease)    Hyperlipidemia    Hypertension    Postoperative bile leak    Sleep apnea    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     Past Surgical History:  Procedure Laterality Date   ANKLE FRACTURE SURGERY Left    APPENDECTOMY  2010   BACK SURGERY     disectomy lumbar, scar tissue   BREAST CYST EXCISION Right 1970   axilla   buninectomy Bilateral    CATARACT EXTRACTION W/PHACO Left 04/12/2018   Procedure: CATARACT EXTRACTION PHACO AND INTRAOCULAR LENS PLACEMENT ([place])  LEFT TORIC LENS;  Surgeon: Leandrew Koyanagi, MD;  Location: [facility];  Service: Ophthalmology;  Laterality: Left;   CATARACT EXTRACTION W/PHACO Right 05/10/2018   Procedure: CATARACT EXTRACTION PHACO AND INTRAOCULAR LENS PLACEMENT ([place])  RIGHT TORIC LENS;  Surgeon: Leandrew Koyanagi, MD;  Location: [facility];  Service: Ophthalmology;  Laterality: Right;  DIABETIC    CHOLECYSTECTOMY N/A 07/05/2017   Procedure: LAPAROSCOPIC CHOLECYSTECTOMY WITH INTRAOPERATIVE CHOLANGIOGRAM;  Surgeon: Robert Bellow, MD;  Location: [facility];  Service: General;  Laterality: N/A;   COLONOSCOPY     2009 and color gaurd in 2018   COLONOSCOPY N/A 03/25/2020   Procedure: COLONOSCOPY;  Surgeon: [name], MD;  Location: [facility];  Service: Endoscopy;  Laterality: N/A;   DRUG INDUCED ENDOSCOPY N/A 05/06/2021   Procedure: DRUG INDUCED SLEEP ENDOSCOPY;  Surgeon: Melida Quitter, MD;  Location: [facility];  Service: ENT;  Laterality: N/A;   ERCP N/A 07/12/2017   Procedure: ENDOSCOPIC RETROGRADE CHOLANGIOPANCREATOGRAPHY (ERCP);  Surgeon: Lucilla Lame, MD;  Location: [facility]COPY;  Service: Endoscopy;  Laterality: N/A;   ERCP N/A 10/04/2017   Procedure: ENDOSCOPIC RETROGRADE CHOLANGIOPANCREATOGRAPHY (ERCP);  Surgeon: Lucilla Lame, MD;  Location: [facility];  Service: Endoscopy;  Laterality: N/A;   FOOT SURGERY     x 2   INCISIONAL HERNIA REPAIR N/A 12/04/2020   Procedure: VENTRAL INCISIONAL HERNIA REPAIR WITH MESH;  Surgeon: Armandina Gemma, MD;  Location: [facility];  Service: General;  Laterality: N/A;   MELANOMA EXCISION Left 2000   PALATOPLASTY N/A 04/08/2015   Procedure: PALATOPLASTY;  Surgeon: Beverly Gust, MD;  Location: [facility];  Service: ENT;  Laterality: N/A;  TONSILLECTOMY N/A 04/08/2015   Procedure: TONSILLECTOMY;  Surgeon: Beverly Gust, MD;  Location: [facility];  Service: ENT;  Laterality: N/A;   TUBAL LIGATION      There were no vitals filed for this visit.    Subjective Assessment - 07/29/21 1108     Subjective Has R foot aches at the ankle in the morning. No pain currently. 3/10 at worst since the surgery. Takes Tylenol for the aches.    Pertinent History S/P R subtalar and talonavicular joint arthrodesis 06/05/2021. Has been having foot pain since May 2022. Has been in a boot or something since then like and ace bandage. Pt [place] a  luggage [place] which bothered her foot.  Had injections, had x-rays, nothing worked. Had to get surgery. Currently on elequis and metoprolol. Got an apple watch to watch her heart rate. Has Afib once every 24-36 hours. Has not yet had PT since her procedure. Also has an acoustic neuroma L ear which affects her balance, therefore she's using a wheel chair.  Pt fell in the bathroom June 06, 2021. Hit her head but her R foot is fine. Head is ok. MD told her that she can weight bear with her CAM boot on Monday August 03, 2021. Start out walking gingerly to see if there is pain, then gradually increase weight for 2-3 days, then midweek should be fine with full weight bearing.    Patient Stated Goals No pain. Be able to bear weight.    Currently in Pain? No/denies    Pain Location Ankle    Pain Orientation Right    Pain Descriptors / Indicators Aching                [facility] PT Assessment - 07/29/21 1127       Assessment   Medical Diagnosis Z98.890 (ICD-10-CM) - S/P foot surgery, right    Referring Provider (PT) Criselda Peaches, DPM    Onset Date/Surgical Date 07/15/21    Prior Therapy Previous PT for R shoulder with positive results      Precautions   Precaution Comments Fall risk      Restrictions   Other Position/Activity Restrictions NWB until August 03, 2021, then gradual increase in weight bering with CAM boot   Per pt: per MD     Balance Screen   Has the patient fallen in the past 6 months Yes    How many times? 1      Home Environment   Additional Comments Pt lives at home with husband. Has rw, and shower chair.      AROM   Right Ankle Dorsiflexion 5    Right Ankle Plantar Flexion 32    Left Ankle Dorsiflexion 8    Left Ankle Plantar Flexion 63      Strength   Right Hip Flexion 4/5    Right Hip Extension 3+/5    Right Hip ABduction 4-/5    Left Hip Flexion 4/5    Left Hip Extension 4-/5    Left Hip ABduction 4-/5    Right Knee Flexion 4+/5     Right Knee Extension 4+/5    Left Knee Flexion 4+/5    Left Knee Extension 4+/5      Ambulation/Gait   Gait Comments Pt currently using wc. With walker and CAM boot: non-weight bearing R LE, hops forward with L LE 5 ft.  Able to perform safely SBA.  Objective measurements completed on examination: See above findings.    Blood pressure is controlled per pt No latex allergies Has allergies to adhesives.    [email or web address]  Medbridge Access Code [email or web address]  Therapeutic exercise  Supine ankle DF/PF 10x3  Supine SLR hip flexion 10x3 with ankle DF   Reviewed and given aforementioned exercises as part of her HEP. Pt demonstrated and verbalized understanding. Handout provided.    Improved exercise technique, movement at target joints, use of target muscles after mod verbal, visual, tactile cues.     Response to treatment Pt tolerated session well without aggravation of symptoms.    Clinical impression Pt is a 71 year old female who came to physical therapy S/P R subtalar and talonavicular arthrodesis on 06/05/2021. Pt currently 7.5 weeks S/P, NWB and wearing a CAM boot on surgical LE. She also presents with B hip and knee weakness, limited R ankle DF and PF AROM, decreased balance, altered gait pattern, and difficulty ambulating and performing standing tasks. Pt will benefit from skilled physical therapy services to address the aforementioned deficits.                 PT Education - 07/29/21 1223     Education Details ther-ex, HEP, POC    Person(s) Educated Patient    Methods Explanation;Demonstration;Tactile cues;Verbal cues;Handout    Comprehension Returned demonstration;Verbalized understanding              PT Short Term Goals - 07/29/21 1231       PT SHORT TERM GOAL #1   Title Pt will be independent with her initial HEP to improve ankle AROM, R LE strength, and ability to ambulate with less difficulty.     Baseline Pt has started her HEP (07/29/2021)    Time 3    Period Weeks    Status New    Target Date 08/20/21               PT Long Term Goals - 07/29/21 1232       PT LONG TERM GOAL #1   Title Pt will improve R ankle DF AROM to 15 degrees and PF to 50 degrees to promote ability to ambulate with less difficulty.    Baseline R ankle DF 5 degrees, PF 32 degrees (07/29/2021)    Time 12    Period Weeks    Status New    Target Date 10/22/21      PT LONG TERM GOAL #2   Title Pt will improve R LE strength by at least 1/2 MMT grade to promote ability to perform standing tasks and ambulate with less difficulty.    Baseline R hip flexion 4/5, extension 3+/5, abduction 4-/5, knee flexion and extension 4+/5. Ankle strength not yet tested (07/29/2021)    Time 12    Period Weeks    Status New    Target Date 10/22/21      PT LONG TERM GOAL #3   Title Pt will be able to ambulate at least 500 ft with regular shoe and no AD to promote mobility.    Baseline RW with R CAM boot, R LE NWB, 5 ft (07/29/2021)    Time 12    Period Weeks    Status New    Target Date 10/22/21      PT LONG TERM GOAL #4   Title Pt will improve her R foot FOTO score by at least 15 points as a demonstration of improved function.    Baseline R  foot FOTO emailed to pt (07/29/2021)    Time 12    Period Weeks    Status New    Target Date 10/22/21                    Plan - 07/29/21 1224     Clinical Impression Statement Pt is a 71 year old female who came to physical therapy S/P R subtalar and talonavicular arthrodesis on 06/05/2021. Pt currently 7.5 weeks S/P, NWB and wearing a CAM boot on surgical LE. She also presents with B hip and knee weakness, limited R ankle DF and PF AROM, decreased balance, altered gait pattern, and difficulty ambulating and performing standing tasks. Pt will benefit from skilled physical therapy services to address the aforementioned deficits.    Personal Factors and Comorbidities  Age;Comorbidity 3+;Fitness;Time since onset of injury/illness/exacerbation    Comorbidities Acoustic neuroma L ear, anxiety, CA, depression, chronic kidney disease, HTN    Examination-Activity Limitations Bathing;Locomotion Level;Transfers;Carry;Squat;Stairs;Lift    Stability/Clinical Decision Making Stable/Uncomplicated    Clinical Decision Making Low    Rehab Potential Fair    PT Frequency 2x / week    PT Duration 12 weeks    PT Treatment/Interventions Neuromuscular re-education;Gait training;Stair training;Functional mobility training;Therapeutic activities;Therapeutic exercise;Balance training;Patient/family education;Manual techniques;Scar mobilization;Dry needling;Vestibular;Electrical Stimulation;Canalith Repostioning;Iontophoresis 4mg /ml Dexamethasone    PT Next Visit Plan Ankle ROM (DF, PF), hip and knee strengthening, gait, manual techniques, modalities PRN    PT Home Exercise Plan Medbridge Access Code [email or web address]    Consulted and Agree with Plan of Care Patient             Patient will benefit from skilled therapeutic intervention in order to improve the following deficits and impairments:  Pain, Improper body mechanics, Postural dysfunction, Decreased strength, Difficulty walking, Decreased range of motion, Decreased balance, Abnormal gait  Visit Diagnosis: Pain in right ankle and joints of right foot - Plan: PT plan of care cert/re-cert  Difficulty in walking, not elsewhere classified - Plan: PT plan of care cert/re-cert  Muscle weakness (generalized) - Plan: PT plan of care cert/re-cert     Problem List Patient Active Problem List   Diagnosis Date Noted   Aortic atherosclerosis ([place]) 05/12/2021   Acute COVID-19 05/12/2021   Positive colorectal cancer screening using Cologuard test 02/05/2020   Esophageal dysphagia 12/11/2019   Hyperlipidemia LDL goal <100 12/10/2019   Right shoulder pain 10/17/2019   Arthritis of right acromioclavicular joint 08/15/2019    Piriformis syndrome of left side 08/15/2019   OSA (obstructive sleep apnea) 05/13/2019   CKD (chronic kidney disease) stage 3, GFR 30-59 ml/min ([place]) 04/13/2018   Closed fracture of distal lateral malleolus of left ankle 12/28/2017   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   S/P laparoscopic cholecystectomy 07/19/2017   Hospital discharge follow-up 07/19/2017   Acoustic neuroma ([place]) 06/12/2017   Colon cancer screening 03/08/2017   Nocturia 12/09/2016   Major depressive disorder, 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[place]) 06/05/2016   Encounter for preventive health examination 03/07/2015   Incisional hernia, without obstruction or gangrene 09/28/2014   Obesity 09/28/2014   Anxiety state 11/19/2013   Gastro-esophageal reflux disease without esophagitis 11/19/2013   Squamous cell carcinoma of skin of face 10/21/2011   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, [place], [address] Phone: [phone]   Fax:  [phone]  Name: Kathy Long MRN: [id number] Date of Birth: 01-02-1951

## 2021-07-29 NOTE — Patient Instructions (Signed)
Access Code: [email or web address] ?URL: [email or web address] ?Date: 07/29/2021 ?Prepared by: Joneen Boers ? ?Exercises ?Supine Ankle Pumps - 5 x daily - 7 x weekly - 3 sets - 10 reps ?Supine Active Straight Leg Raise - 1 x daily - 7 x weekly - 3 sets - 10 reps ? ?

## 2021-07-29 NOTE — Telephone Encounter (Signed)
I have faxed the physical therapy orders and the last note to [facility]. ?

## 2021-08-03 ENCOUNTER — Other Ambulatory Visit: Payer: Self-pay

## 2021-08-03 ENCOUNTER — Ambulatory Visit: Payer: Medicare PPO

## 2021-08-03 DIAGNOSIS — M6281 Muscle weakness (generalized): Secondary | ICD-10-CM | POA: Diagnosis not present

## 2021-08-03 DIAGNOSIS — R262 Difficulty in walking, not elsewhere classified: Secondary | ICD-10-CM | POA: Diagnosis not present

## 2021-08-03 DIAGNOSIS — M25571 Pain in right ankle and joints of right foot: Secondary | ICD-10-CM

## 2021-08-03 DIAGNOSIS — Z9889 Other specified postprocedural states: Secondary | ICD-10-CM | POA: Diagnosis not present

## 2021-08-03 NOTE — Patient Instructions (Signed)
Access Code: [email or web address] ?URL: [email or web address] ?Date: 08/03/2021 ?Prepared by: Joneen Boers ? ?Exercises ?Supine Ankle Pumps - 5 x daily - 7 x weekly - 3 sets - 10 reps ?Supine Active Straight Leg Raise - 1 x daily - 7 x weekly - 3 sets - 10 reps ?Sidelying Hip Abduction - 1 x daily - 7 x weekly - 3 sets - 5 reps ? ?

## 2021-08-03 NOTE — Therapy (Signed)
[facility] [address] [place], [address] Phone: [phone]   Fax:  [phone]  Physical Therapy Treatment  Patient Details  Name: Kathy Long MRN: [id number] Date of Birth: 1950/10/02 Referring Provider (PT): Criselda Peaches, [place]   Encounter Date: 08/03/2021   PT End of Session - 08/03/21 1022     Visit Number 2    Number of Visits 25    Date for PT Re-Evaluation 10/22/21    Authorization Type 2    Authorization Time Period of 10 progress report    PT Start Time 1022    PT Stop Time 1102    PT Time Calculation (min) 40 min    Equipment Utilized During Treatment --   wc, rw   Activity Tolerance Patient tolerated treatment well    Behavior During Therapy WFL for tasks assessed/performed             Past Medical History:  Diagnosis Date   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   Anxiety    Arthritis    lower back, hands   Cancer ([place])    melanoma, shoulder   Chronic kidney disease    Depression    GERD (gastroesophageal reflux disease)    Hyperlipidemia    Hypertension    Postoperative bile leak    Sleep apnea    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     Past Surgical History:  Procedure Laterality Date   ANKLE FRACTURE SURGERY Left    APPENDECTOMY  2010   BACK SURGERY     disectomy lumbar, scar tissue   BREAST CYST EXCISION Right 1970   axilla   buninectomy Bilateral    CATARACT EXTRACTION W/PHACO Left 04/12/2018   Procedure: CATARACT EXTRACTION PHACO AND INTRAOCULAR LENS PLACEMENT ([place])  LEFT TORIC LENS;  Surgeon: Leandrew Koyanagi, MD;  Location: [facility];  Service: Ophthalmology;  Laterality: Left;   CATARACT EXTRACTION W/PHACO Right 05/10/2018   Procedure: CATARACT EXTRACTION PHACO AND INTRAOCULAR LENS PLACEMENT ([place])  RIGHT TORIC LENS;  Surgeon: Leandrew Koyanagi, MD;  Location: [facility];  Service: Ophthalmology;  Laterality: Right;  DIABETIC    CHOLECYSTECTOMY N/A 07/05/2017   Procedure: LAPAROSCOPIC CHOLECYSTECTOMY WITH INTRAOPERATIVE CHOLANGIOGRAM;  Surgeon: Robert Bellow, MD;  Location: [facility];  Service: General;  Laterality: N/A;   COLONOSCOPY     2009 and color gaurd in 2018   COLONOSCOPY N/A 03/25/2020   Procedure: COLONOSCOPY;  Surgeon: [name], MD;  Location: [facility];  Service: Endoscopy;  Laterality: N/A;   DRUG INDUCED ENDOSCOPY N/A 05/06/2021   Procedure: DRUG INDUCED SLEEP ENDOSCOPY;  Surgeon: Melida Quitter, MD;  Location: [facility];  Service: ENT;  Laterality: N/A;   ERCP N/A 07/12/2017   Procedure: ENDOSCOPIC RETROGRADE CHOLANGIOPANCREATOGRAPHY (ERCP);  Surgeon: Lucilla Lame, MD;  Location: [facility] ENDOSCOPY;  Service: Endoscopy;  Laterality: N/A;   ERCP N/A 10/04/2017   Procedure: ENDOSCOPIC RETROGRADE CHOLANGIOPANCREATOGRAPHY (ERCP);  Surgeon: Lucilla Lame, MD;  Location: [facility]OSCOPY;  Service: Endoscopy;  Laterality: N/A;   FOOT SURGERY     x 2   INCISIONAL HERNIA REPAIR N/A 12/04/2020   Procedure: VENTRAL INCISIONAL HERNIA REPAIR WITH MESH;  Surgeon: Armandina Gemma, MD;  [facility];  Service: General;  Laterality: N/A;   MELANOMA EXCISION Left 2000   PALATOPLASTY N/A 04/08/2015   Procedure: PALATOPLASTY;  Surgeon: Beverly Gust, MD;  Location: [facility];  Service: ENT;  Laterality: N/A;  TONSILLECTOMY N/A 04/08/2015   Procedure: TONSILLECTOMY;  Surgeon: Beverly Gust, MD;  Location: [facility];  Service: ENT;  Laterality: N/A;   TUBAL LIGATION      There were no vitals filed for this visit.   Subjective Assessment - 08/03/21 1023     Subjective Started WB with CAM boot this morning. No pain currently. Not taking pain medications.  Was told that she can take the wc away. Still has a purple foot. Normal when she props it up.    Pertinent History S/P R subtalar and talonavicular joint arthrodesis 06/05/2021. Has been having foot pain since May 2022. Has been in a boot or  something since then like and ace bandage. Pt [place] which bothered her foot.  Had injections, had x-rays, nothing worked. Had to get surgery. Currently on elequis and metoprolol. Got an apple watch to watch her heart rate. Has Afib once every 24-36 hours. Has not yet had PT since her procedure. Also has an acoustic neuroma L ear which affects her balance, therefore she's using a wheel chair.  Pt fell in the bathroom June 06, 2021. Hit her head but her R foot is fine. Head is ok. MD told her that she can weight bear with her CAM boot on Monday August 03, 2021. Start out walking gingerly to see if there is pain, then gradually increase weight for 2-3 days, then midweek should be fine with full weight bearing.    Patient Stated Goals No pain. Be able to bear weight.    Currently in Pain? No/denies                                        PT Education - 08/03/21 1038     Education Details ther-ex, HEP    Person(s) Educated Patient    Methods Explanation;Demonstration;Tactile cues;Verbal cues;Handout    Comprehension Returned demonstration;Verbalized understanding              Objective      Blood pressure is controlled per pt No latex allergies Has allergies to adhesives.      [email or web address]   Medbridge Access Code [email or web address]   Therapeutic exercise  Pt currently 2.5 weeks since 07/15/2021 MD visit, able to start WB per instructions  Seated LAQ 10x5 seconds for 2 sets  S/L hip abduction   R 5x3  L 5x3  Hooklying hip extension isometrics, leg straight  R 10x3 with 5 second holds   Supine toe curles 10x2 with 5 second holds   Slight ache at arch, eases with rest.   Supine ankle DF/PF 10x  Supine toe spreads. 10x5 seconds      Improved exercise technique, movement at target joints, use of target muscles after mod verbal, visual, tactile cues.    Manual therapy  Supine Gentle STM plantar foot and  heel to decrease soft tissue restrictions      Response to treatment Pt tolerated session well without aggravation of symptoms.      Clinical impression Steady gait observed with CAM boot, no AD and no LOB, no pain. Worked on improving glute med and max strength as well as intrinsic R foot muscle activation to promote ability to ambulate and perform standing tasks with less difficulty. Pt tolerated session well without aggravation of symptoms. Pt will benefit from continued skilled physical therapy services to  improve ROM, strength and function.      PT Short Term Goals - 07/29/21 1231       PT SHORT TERM GOAL #1   Title Pt will be independent with her initial HEP to improve ankle AROM, R LE strength, and ability to ambulate with less difficulty.    Baseline Pt has started her HEP (07/29/2021)    Time 3    Period Weeks    Status New    Target Date 08/20/21               PT Long Term Goals - 07/29/21 1232       PT LONG TERM GOAL #1   Title Pt will improve R ankle DF AROM to 15 degrees and PF to 50 degrees to promote ability to ambulate with less difficulty.    Baseline R ankle DF 5 degrees, PF 32 degrees (07/29/2021)    Time 12    Period Weeks    Status New    Target Date 10/22/21      PT LONG TERM GOAL #2   Title Pt will improve R LE strength by at least 1/2 MMT grade to promote ability to perform standing tasks and ambulate with less difficulty.    Baseline R hip flexion 4/5, extension 3+/5, abduction 4-/5, knee flexion and extension 4+/5. Ankle strength not yet tested (07/29/2021)    Time 12    Period Weeks    Status New    Target Date 10/22/21      PT LONG TERM GOAL #3   Title Pt will be able to ambulate at least 500 ft with regular shoe and no AD to promote mobility.    Baseline RW with R CAM boot, R LE NWB, 5 ft (07/29/2021)    Time 12    Period Weeks    Status New    Target Date 10/22/21      PT LONG TERM GOAL #4   Title Pt will improve her R foot FOTO score by  at least 15 points as a demonstration of improved function.    Baseline R foot FOTO emailed to pt (07/29/2021)    Time 12    Period Weeks    Status New    Target Date 10/22/21                    Patient will benefit from skilled therapeutic intervention in order to improve the following deficits and impairments:     Visit Diagnosis: No diagnosis found.     Problem List Patient Active Problem List   Diagnosis Date Noted   Aortic atherosclerosis ([place]) 05/12/2021   Acute COVID-19 05/12/2021   Positive colorectal cancer screening using Cologuard test 02/05/2020   Esophageal dysphagia 12/11/2019   Hyperlipidemia LDL goal <100 12/10/2019   Right shoulder pain 10/17/2019   Arthritis of right acromioclavicular joint 08/15/2019   Piriformis syndrome of left side 08/15/2019   OSA (obstructive sleep apnea) 05/13/2019   CKD (chronic kidney disease) stage 3, GFR 30-59 ml/min ([facility]) 04/13/2018   Closed fracture of distal lateral malleolus of left ankle 12/28/2017   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   S/P laparoscopic cholecystectomy 07/19/2017   Hospital discharge follow-up 07/19/2017   Acoustic neuroma ([place]) 06/12/2017   Colon cancer screening 03/08/2017   Nocturia 12/09/2016   Major depressive disorder, 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[place]) 06/05/2016   Encounter for preventive health examination 03/07/2015   Incisional hernia, without obstruction or gangrene 09/28/2014   Obesity 09/28/2014  Anxiety state 11/19/2013   Gastro-esophageal reflux disease without esophagitis 11/19/2013   Squamous cell carcinoma of skin of face 10/21/2011   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 [place],  [address] Phone: [phone]   Fax:  [phone]  Name: Kathy Long MRN: [id number] Date of Birth: 1950-11-02

## 2021-08-04 ENCOUNTER — Ambulatory Visit
Admission: RE | Admit: 2021-08-04 | Discharge: 2021-08-04 | Disposition: A | Discharge status: Home / Self Care | Payer: Medicare PPO | Source: Ambulatory Visit | Attending: Internal Medicine | Admitting: Internal Medicine

## 2021-08-04 DIAGNOSIS — Z1231 Encounter for screening mammogram for malignant neoplasm of breast: Secondary | ICD-10-CM | POA: Insufficient documentation

## 2021-08-05 ENCOUNTER — Other Ambulatory Visit: Payer: Self-pay

## 2021-08-05 ENCOUNTER — Ambulatory Visit: Payer: Medicare PPO

## 2021-08-05 DIAGNOSIS — R262 Difficulty in walking, not elsewhere classified: Secondary | ICD-10-CM | POA: Diagnosis not present

## 2021-08-05 DIAGNOSIS — Z9889 Other specified postprocedural states: Secondary | ICD-10-CM | POA: Diagnosis not present

## 2021-08-05 DIAGNOSIS — M25571 Pain in right ankle and joints of right foot: Secondary | ICD-10-CM

## 2021-08-05 DIAGNOSIS — M6281 Muscle weakness (generalized): Secondary | ICD-10-CM

## 2021-08-05 NOTE — Therapy (Signed)
[facility] [address] [place], [address] Phone: [phone]   Fax:  [phone]  Physical Therapy Treatment  Patient Details  Name: Kathy Long MRN: [id number] Date of Birth: 02/16/1951 Referring Provider (PT): Criselda Peaches, [place]   Encounter Date: 08/05/2021   PT End of Session - 08/05/21 1100     Visit Number 3    Number of Visits 25    Date for PT Re-Evaluation 10/22/21    Authorization Type 3    Authorization Time Period of 10 progress report    PT Start Time 1101    PT Stop Time 1144    PT Time Calculation (min) 43 min    Equipment Utilized During Treatment --   wc, rw   Activity Tolerance Patient tolerated treatment well    Behavior During Therapy WFL for tasks assessed/performed             Past Medical History:  Diagnosis Date   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   Anxiety    Arthritis    lower back, hands   Cancer ([place])    melanoma, shoulder   Chronic kidney disease    Depression    GERD (gastroesophageal reflux disease)    Hyperlipidemia    Hypertension    Postoperative bile leak    Sleep apnea    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     Past Surgical History:  Procedure Laterality Date   ANKLE FRACTURE SURGERY Left    APPENDECTOMY  2010   BACK SURGERY     disectomy lumbar, scar tissue   BREAST CYST EXCISION Right 1970   axilla   buninectomy Bilateral    CATARACT EXTRACTION W/PHACO Left 04/12/2018   Procedure: CATARACT EXTRACTION PHACO AND INTRAOCULAR LENS PLACEMENT ([place])  LEFT TORIC LENS;  Surgeon: Leandrew Koyanagi, MD;  Location: [facility];  Service: Ophthalmology;  Laterality: Left;   CATARACT EXTRACTION W/PHACO Right 05/10/2018   Procedure: CATARACT EXTRACTION PHACO AND INTRAOCULAR LENS PLACEMENT ([place])  RIGHT TORIC LENS;  Surgeon: Leandrew Koyanagi, MD;  Location: [facility];  Service: Ophthalmology;  Laterality: Right;  DIABETIC    CHOLECYSTECTOMY N/A 07/05/2017   Procedure: LAPAROSCOPIC CHOLECYSTECTOMY WITH INTRAOPERATIVE CHOLANGIOGRAM;  Surgeon: Robert Bellow, MD;  Location: [facility];  Service: General;  Laterality: N/A;   COLONOSCOPY     2009 and color gaurd in 2018   COLONOSCOPY N/A 03/25/2020   Procedure: COLONOSCOPY;  Surgeon: [name], MD;  Location: [facility];  Service: Endoscopy;  Laterality: N/A;   DRUG INDUCED ENDOSCOPY N/A 05/06/2021   Procedure: DRUG INDUCED SLEEP ENDOSCOPY;  Surgeon: Melida Quitter, MD;  Location: [facility];  Service: ENT;  Laterality: N/A;   ERCP N/A 07/12/2017   Procedure: ENDOSCOPIC RETROGRADE CHOLANGIOPANCREATOGRAPHY (ERCP);  Surgeon: Lucilla Lame, MD;  Location: [facility];  Service: Endoscopy;  Laterality: N/A;   ERCP N/A 10/04/2017   Procedure: ENDOSCOPIC RETROGRADE CHOLANGIOPANCREATOGRAPHY (ERCP);  Surgeon: Lucilla Lame, MD;  Location: [facility];  Service: Endoscopy;  Laterality: N/A;   FOOT SURGERY     x 2   INCISIONAL HERNIA REPAIR N/A 12/04/2020   Procedure: VENTRAL INCISIONAL HERNIA REPAIR WITH MESH;  Surgeon: Armandina Gemma, MD;  Location: [facility];  Service: General;  Laterality: N/A;   MELANOMA EXCISION Left 2000   PALATOPLASTY N/A 04/08/2015   Procedure: PALATOPLASTY;  Surgeon: Beverly Gust, MD;  Location: [facility];  Service: ENT;  Laterality: N/A;  TONSILLECTOMY N/A 04/08/2015   Procedure: TONSILLECTOMY;  Surgeon: Beverly Gust, MD;  Location: [facility];  Service: ENT;  Laterality: N/A;   TUBAL LIGATION      There were no vitals filed for this visit.   Subjective Assessment - 08/05/21 1102     Subjective Started using her rw secondary to her foot starting to bother her. Easing back into weight bearing with the boot. 3/10 currently.    Pertinent History S/P R subtalar and talonavicular joint arthrodesis 06/05/2021. Has been having foot pain since May 2022. Has been in a boot or something since then like and ace bandage. Pt push  a luggage rack [place] which bothered her foot.  Had injections, had x-rays, nothing worked. Had to get surgery. Currently on elequis and metoprolol. Got an apple watch to watch her heart rate. Has Afib once every 24-36 hours. Has not yet had PT since her procedure. Also has an acoustic neuroma L ear which affects her balance, therefore she's using a wheel chair.  Pt fell in the bathroom June 06, 2021. Hit her head but her R foot is fine. Head is ok. MD told her that she can weight bear with her CAM boot on Monday August 03, 2021. Start out walking gingerly to see if there is pain, then gradually increase weight for 2-3 days, then midweek should be fine with full weight bearing.    Patient Stated Goals No pain. Be able to bear weight.    Currently in Pain? Yes    Pain Score 3                                         Objective      Blood pressure is controlled per pt No latex allergies Has allergies to adhesives.      [email or web address]   Medbridge Access Code [email or web address]   Therapeutic exercise  Seated LAQ 10x5 seconds for 2 sets   S/L hip abduction              R 5x3             L 5x3   Hooklying hip extension isometrics, leg straight             R 10x3 with 5 second holds    Hooklying posterior pelvic tilt 10x5 seconds   Hooklying clamshell red band 10x2 with 5 second holds   Hooklying hip adduction isometrics, small physioball squeeze 10x5 seconds for 2 sets   Supine ankle DF/PF 10x   Supine toe curles 10x2 with 5 second holds              Slight ache at arch, eases with rest.     Supine toe spreads. 10x5 seconds for 2 sets   Gait with rw with about 50% weight bearing with CAM boot on. From clinic to gym doors.          Improved exercise technique, movement at target joints, use of target muscles after mod verbal, visual, tactile cues.      Response to treatment Pt tolerated session well without aggravation  of symptoms.      Clinical impression Continued working on glute med and max strength as well as ankle DF/PF, and intrinsic muscle activation to promote ability to ambulate with CAM boot with less difficulty. Worked on gradual weight bearing during gait  using rw to improve comfort with gait. No pain at end of session reported during gait. Pt will benefit from continued skilled physical therapy services to improve ROM, strength and function.      PT Education - 08/05/21 1111     Education Details ther-ex, HEP    Person(s) Educated Patient    Methods Explanation;Demonstration;Tactile cues;Verbal cues;Handout    Comprehension Returned demonstration;Verbalized understanding              PT Short Term Goals - 07/29/21 1231       PT SHORT TERM GOAL #1   Title Pt will be independent with her initial HEP to improve ankle AROM, R LE strength, and ability to ambulate with less difficulty.    Baseline Pt has started her HEP (07/29/2021)    Time 3    Period Weeks    Status New    Target Date 08/20/21               PT Long Term Goals - 07/29/21 1232       PT LONG TERM GOAL #1   Title Pt will improve R ankle DF AROM to 15 degrees and PF to 50 degrees to promote ability to ambulate with less difficulty.    Baseline R ankle DF 5 degrees, PF 32 degrees (07/29/2021)    Time 12    Period Weeks    Status New    Target Date 10/22/21      PT LONG TERM GOAL #2   Title Pt will improve R LE strength by at least 1/2 MMT grade to promote ability to perform standing tasks and ambulate with less difficulty.    Baseline R hip flexion 4/5, extension 3+/5, abduction 4-/5, knee flexion and extension 4+/5. Ankle strength not yet tested (07/29/2021)    Time 12    Period Weeks    Status New    Target Date 10/22/21      PT LONG TERM GOAL #3   Title Pt will be able to ambulate at least 500 ft with regular shoe and no AD to promote mobility.    Baseline RW with R CAM boot, R LE NWB, 5 ft (07/29/2021)     Time 12    Period Weeks    Status New    Target Date 10/22/21      PT LONG TERM GOAL #4   Title Pt will improve her R foot FOTO score by at least 15 points as a demonstration of improved function.    Baseline R foot FOTO emailed to pt (07/29/2021)    Time 12    Period Weeks    Status New    Target Date 10/22/21                   Plan - 08/05/21 1100     Clinical Impression Statement Continued working on glute med and max strength as well as ankle DF/PF, and intrinsic muscle activation to promote ability to ambulate with CAM boot with less difficulty. Worked on gradual weight bearing during gait using rw to improve comfort with gait. No pain at end of session reported during gait. Pt will benefit from continued skilled physical therapy services to improve ROM, strength and function.    Personal Factors and Comorbidities Age;Comorbidity 3+;Fitness;Time since onset of injury/illness/exacerbation    Comorbidities Acoustic neuroma L ear, anxiety, CA, depression, chronic kidney disease, HTN    Examination-Activity Limitations Bathing;Locomotion Level;Transfers;Carry;Squat;Stairs;Lift    Stability/Clinical Decision Making Stable/Uncomplicated  Clinical Decision Making Low    Rehab Potential Fair    PT Frequency 2x / week    PT Duration 12 weeks    PT Treatment/Interventions Neuromuscular re-education;Gait training;Stair training;Functional mobility training;Therapeutic activities;Therapeutic exercise;Balance training;Patient/family education;Manual techniques;Scar mobilization;Dry needling;Vestibular;Electrical Stimulation;Canalith Repostioning;Iontophoresis '4mg'$ /ml Dexamethasone    PT Next Visit Plan Ankle ROM (DF, PF), hip and knee strengthening, gait, manual techniques, modalities PRN    PT Home Exercise Plan Medbridge Access Code [email or web address]    Consulted and Agree with Plan of Care Patient             Patient will benefit from skilled therapeutic intervention in order to  improve the following deficits and impairments:  Pain, Improper body mechanics, Postural dysfunction, Decreased strength, Difficulty walking, Decreased range of motion, Decreased balance, Abnormal gait  Visit Diagnosis: Pain in right ankle and joints of right foot  Difficulty in walking, not elsewhere classified  Muscle weakness (generalized)     Problem List Patient Active Problem List   Diagnosis Date Noted   Aortic atherosclerosis ([place]) 05/12/2021   Acute COVID-19 05/12/2021   Positive colorectal cancer screening using Cologuard test 02/05/2020   Esophageal dysphagia 12/11/2019   Hyperlipidemia LDL goal <100 12/10/2019   Right shoulder pain 10/17/2019   Arthritis of right acromioclavicular joint 08/15/2019   Piriformis syndrome of left side 08/15/2019   OSA (obstructive sleep apnea) 05/13/2019   CKD (chronic kidney disease) stage 3, GFR 30-59 ml/min ([place]) 04/13/2018   Closed fracture of distal lateral malleolus of left ankle 12/28/2017   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   S/P laparoscopic cholecystectomy 07/19/2017   Hospital discharge follow-up 07/19/2017   Acoustic neuroma ([place]) 06/12/2017   Colon cancer screening 03/08/2017   Nocturia 12/09/2016   Major depressive disorder, 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[place]) 06/05/2016   Encounter for preventive health examination 03/07/2015   Incisional hernia, without obstruction or gangrene 09/28/2014   Obesity 09/28/2014   Anxiety state 11/19/2013   Gastro-esophageal reflux disease without esophagitis 11/19/2013   Squamous cell carcinoma of skin of face 10/21/2011   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 [place], [address] Phone: [phone]    Fax:  [phone]  Name: Kathy Long MRN: [id number] Date of Birth: Sep 09, 1950

## 2021-08-05 NOTE — Patient Instructions (Signed)
Access Code: [email or web address] ?URL: [email or web address] ?Date: 08/05/2021 ?Prepared by: Joneen Boers ? ?Exercises ?Supine Ankle Pumps - 5 x daily - 7 x weekly - 3 sets - 10 reps ?Supine Active Straight Leg Raise - 1 x daily - 7 x weekly - 3 sets - 10 reps ?Sidelying Hip Abduction - 1 x daily - 7 x weekly - 3 sets - 5 reps ?Supine Posterior Pelvic Tilt - 1 x daily - 7 x weekly - 3 sets - 10 reps - 5 seconds hold ? ?

## 2021-08-10 ENCOUNTER — Other Ambulatory Visit: Payer: Self-pay

## 2021-08-10 ENCOUNTER — Ambulatory Visit: Payer: Medicare PPO

## 2021-08-10 DIAGNOSIS — M6281 Muscle weakness (generalized): Secondary | ICD-10-CM

## 2021-08-10 DIAGNOSIS — Z9889 Other specified postprocedural states: Secondary | ICD-10-CM | POA: Diagnosis not present

## 2021-08-10 DIAGNOSIS — R262 Difficulty in walking, not elsewhere classified: Secondary | ICD-10-CM | POA: Diagnosis not present

## 2021-08-10 DIAGNOSIS — M25571 Pain in right ankle and joints of right foot: Secondary | ICD-10-CM | POA: Diagnosis not present

## 2021-08-10 NOTE — Therapy (Signed)
[facility] [address], [place], [address] Phone: [phone]   Fax:  [phone]  Physical Therapy Treatment  Patient Details  Name: Kathy Long MRN: [id number] Date of Birth: [birth year] Referring Provider (PT): Criselda Peaches, [place]   Encounter Date: 08/10/2021   PT End of Session - 08/10/21 1148     Visit Number 4    Number of Visits 25    Date for PT Re-Evaluation 10/22/21    Authorization Type 4    Authorization Time Period of 10 progress report    PT Start Time 1148    PT Stop Time 1232    PT Time Calculation (min) 44 min    Equipment Utilized During Treatment --   wc, rw   Activity Tolerance Patient tolerated treatment well    Behavior During Therapy WFL for tasks assessed/performed             Past Medical History:  Diagnosis Date   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   Anxiety    Arthritis    lower back, hands   Cancer ([place])    melanoma, shoulder   Chronic kidney disease    Depression    GERD (gastroesophageal reflux disease)    Hyperlipidemia    Hypertension    Postoperative bile leak    Sleep apnea    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     Past Surgical History:  Procedure Laterality Date   ANKLE FRACTURE SURGERY Left    APPENDECTOMY  2010   BACK SURGERY     disectomy lumbar, scar tissue   BREAST CYST EXCISION Right 1970   axilla   buninectomy Bilateral    CATARACT EXTRACTION W/PHACO Left 04/12/2018   Procedure: CATARACT EXTRACTION PHACO AND INTRAOCULAR LENS PLACEMENT ([place])  LEFT TORIC LENS;  Surgeon: Leandrew Koyanagi, MD;  Location: [facility];  Service: Ophthalmology;  Laterality: Left;   CATARACT EXTRACTION W/PHACO Right 05/10/2018   Procedure: CATARACT EXTRACTION PHACO AND INTRAOCULAR LENS PLACEMENT ([place])  RIGHT TORIC LENS;  Surgeon: Leandrew Koyanagi, MD;  Location: [facility];  Service: Ophthalmology;  Laterality: Right;  DIABETIC    CHOLECYSTECTOMY N/A 07/05/2017   Procedure: LAPAROSCOPIC CHOLECYSTECTOMY WITH INTRAOPERATIVE CHOLANGIOGRAM;  Surgeon: Robert Bellow, MD;  Location: [facility];  Service: General;  Laterality: N/A;   COLONOSCOPY     2009 and color gaurd in 2018   COLONOSCOPY N/A 03/25/2020   Procedure: COLONOSCOPY;  Surgeon: [name], MD;  Location: [facility];  Service: Endoscopy;  Laterality: N/A;   DRUG INDUCED ENDOSCOPY N/A 05/06/2021   Procedure: DRUG INDUCED SLEEP ENDOSCOPY;  Surgeon: Melida Quitter, MD;  Location: [facility];  Service: ENT;  Laterality: N/A;   ERCP N/A 07/12/2017   Procedure: ENDOSCOPIC RETROGRADE CHOLANGIOPANCREATOGRAPHY (ERCP);  Surgeon: Lucilla Lame, MD;  [facility];  Service: Endoscopy;  Laterality: N/A;   ERCP N/A 10/04/2017   Procedure: ENDOSCOPIC RETROGRADE CHOLANGIOPANCREATOGRAPHY (ERCP);  Surgeon: Lucilla Lame, MD;  Location: [facility];  Service: Endoscopy;  Laterality: N/A;   FOOT SURGERY     x 2   INCISIONAL HERNIA REPAIR N/A 12/04/2020   Procedure: VENTRAL INCISIONAL HERNIA REPAIR WITH MESH;  Surgeon: Armandina Gemma, MD;  Location: [facility];  Service: General;  Laterality: N/A;   MELANOMA EXCISION Left 2000   PALATOPLASTY N/A 04/08/2015   Procedure: PALATOPLASTY;  Surgeon: Beverly Gust, MD;  Location: [facility];  Service: ENT;  Laterality: N/A;  TONSILLECTOMY N/A 04/08/2015   Procedure: TONSILLECTOMY;  Surgeon: Beverly Gust, MD;  Location: [facility];  Service: ENT;  Laterality: N/A;   TUBAL LIGATION      There were no vitals filed for this visit.   Subjective Assessment - 08/10/21 1150     Subjective Was able to take a standing shower this morning. Had a grab bar to hold on to. Foot was fine. Currently still doing 50% weight bearing.    Pertinent History S/P R subtalar and talonavicular joint arthrodesis 06/05/2021. Has been having foot pain since May 2022. Has been in a boot or something since then like and ace bandage. Pt  [place] luggage [place] which bothered her foot.  Had injections, had x-rays, nothing worked. Had to get surgery. Currently on elequis and metoprolol. Got an apple watch to watch her heart rate. Has Afib once every 24-36 hours. Has not yet had PT since her procedure. Also has an acoustic neuroma L ear which affects her balance, therefore she's using a wheel chair.  Pt fell in the bathroom June 06, 2021. Hit her head but her R foot is fine. Head is ok. MD told her that she can weight bear with her CAM boot on Monday August 03, 2021. Start out walking gingerly to see if there is pain, then gradually increase weight for 2-3 days, then midweek should be fine with full weight bearing.    Patient Stated Goals No pain. Be able to bear weight.    Currently in Pain? No/denies                                        PT Education - 08/10/21 1212     Education Details ther-ex    Person(s) Educated Patient    Methods Explanation;Demonstration;Tactile cues;Verbal cues    Comprehension Returned demonstration;Verbalized understanding            Objective      Blood pressure is controlled per pt No latex allergies Has allergies to adhesives.      [email or web address]   Medbridge Access Code [email or web address]   Therapeutic exercise   Gait with rw with about 75% weight bearing with CAM boot on.  100 ft  Seated LAQ 10x10 seconds    S/L hip abduction              R 5x3             L 5x3  Hooklying posterior pelvic tilt 10x5 seconds for 2 sets   Hooklying clamshell red band 10x2 with 5 second holds   Hooklying hip adduction isometrics, small physioball squeeze 10x10 seconds   Gait with SPC on L side 50 ft for 75 % weight baring. CAM boot utilized for all gait. Some unsteadiness, needing CGA to min A at times.   R plantar foot discomfort, eases with rest.   Supine ankle DF/PF 10x   Supine toe curles 10x2 with 5 second holds               Slight ache at arch, eases with rest.     Supine toe spreads. 10x5 seconds for 2 sets    Improved exercise technique, movement at target joints, use of target muscles after mod verbal, visual, tactile cues.      Response to treatment Pt tolerated session well without aggravation of symptoms.  Clinical impression Worked on increasing weight bearing to 75% today using rw as well as SPC. Some unsteadiness with SPC, needing CGA to min A at times. Continued working on glute med and max strength as well as ankle DF/PF, and intrinsic muscle activation to promote ability to ambulate with CAM boot with less difficulty. Pt tolerated session well without aggravation of symptoms. Pt will benefit from continued skilled physical therapy services to improve ROM, strength and function.        PT Short Term Goals - 07/29/21 1231       PT SHORT TERM GOAL #1   Title Pt will be independent with her initial HEP to improve ankle AROM, R LE strength, and ability to ambulate with less difficulty.    Baseline Pt has started her HEP (07/29/2021)    Time 3    Period Weeks    Status New    Target Date 08/20/21               PT Long Term Goals - 07/29/21 1232       PT LONG TERM GOAL #1   Title Pt will improve R ankle DF AROM to 15 degrees and PF to 50 degrees to promote ability to ambulate with less difficulty.    Baseline R ankle DF 5 degrees, PF 32 degrees (07/29/2021)    Time 12    Period Weeks    Status New    Target Date 10/22/21      PT LONG TERM GOAL #2   Title Pt will improve R LE strength by at least 1/2 MMT grade to promote ability to perform standing tasks and ambulate with less difficulty.    Baseline R hip flexion 4/5, extension 3+/5, abduction 4-/5, knee flexion and extension 4+/5. Ankle strength not yet tested (07/29/2021)    Time 12    Period Weeks    Status New    Target Date 10/22/21      PT LONG TERM GOAL #3   Title Pt will be able to ambulate at least 500 ft with regular  shoe and no AD to promote mobility.    Baseline RW with R CAM boot, R LE NWB, 5 ft (07/29/2021)    Time 12    Period Weeks    Status New    Target Date 10/22/21      PT LONG TERM GOAL #4   Title Pt will improve her R foot FOTO score by at least 15 points as a demonstration of improved function.    Baseline R foot FOTO emailed to pt (07/29/2021)    Time 12    Period Weeks    Status New    Target Date 10/22/21                   Plan - 08/10/21 1213     Clinical Impression Statement Worked on increasing weight bearing to 75% today using rw as well as SPC. Some unsteadiness with SPC, needing CGA to min A at times. Continued working on glute med and max strength as well as ankle DF/PF, and intrinsic muscle activation to promote ability to ambulate with CAM boot with less difficulty. Pt tolerated session well without aggravation of symptoms. Pt will benefit from continued skilled physical therapy services to improve ROM, strength and function.    Personal Factors and Comorbidities Age;Comorbidity 3+;Fitness;Time since onset of injury/illness/exacerbation    Comorbidities Acoustic neuroma L ear, anxiety, CA, depression, chronic kidney disease, HTN  Examination-Activity Limitations Bathing;Locomotion Level;Transfers;Carry;Squat;Stairs;Lift    Stability/Clinical Decision Making Stable/Uncomplicated    Rehab Potential Fair    PT Frequency 2x / week    PT Duration 12 weeks    PT Treatment/Interventions Neuromuscular re-education;Gait training;Stair training;Functional mobility training;Therapeutic activities;Therapeutic exercise;Balance training;Patient/family education;Manual techniques;Scar mobilization;Dry needling;Vestibular;Electrical Stimulation;Canalith Repostioning;Iontophoresis '4mg'$ /ml Dexamethasone    PT Next Visit Plan Ankle ROM (DF, PF), hip and knee strengthening, gait, manual techniques, modalities PRN    PT Home Exercise Plan Medbridge Access Code [email or web address]    Consulted and  Agree with Plan of Care Patient             Patient will benefit from skilled therapeutic intervention in order to improve the following deficits and impairments:  Pain, Improper body mechanics, Postural dysfunction, Decreased strength, Difficulty walking, Decreased range of motion, Decreased balance, Abnormal gait  Visit Diagnosis: Pain in right ankle and joints of right foot  Difficulty in walking, not elsewhere classified  Muscle weakness (generalized)     Problem List Patient Active Problem List   Diagnosis Date Noted   Aortic atherosclerosis ([place]) 05/12/2021   Acute COVID-19 05/12/2021   Positive colorectal cancer screening using Cologuard test 02/05/2020   Esophageal dysphagia 12/11/2019   Hyperlipidemia LDL goal <100 12/10/2019   Right shoulder pain 10/17/2019   Arthritis of right acromioclavicular joint 08/15/2019   Piriformis syndrome of left side 08/15/2019   OSA (obstructive sleep apnea) 05/13/2019   CKD (chronic kidney disease) stage 3, GFR 30-59 ml/min ([place]) 04/13/2018   Closed fracture of distal lateral malleolus of left ankle 12/28/2017   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   S/P laparoscopic cholecystectomy 07/19/2017   Hospital discharge follow-up 07/19/2017   Acoustic neuroma ([place]) 06/12/2017   Colon cancer screening 03/08/2017   Nocturia 12/09/2016   Major depressive disorder, 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[place]) 06/05/2016   Encounter for preventive health examination 03/07/2015   Incisional hernia, without obstruction or gangrene 09/28/2014   Obesity 09/28/2014   Anxiety state 11/19/2013   Gastro-esophageal reflux disease without esophagitis 11/19/2013   Squamous cell carcinoma of skin of face 10/21/2011   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 [place], [address] Phone: [phone]   Fax:  [phone]  Name: Kathy Long MRN: [id number] Date of Birth: [birth year]

## 2021-08-13 ENCOUNTER — Ambulatory Visit: Payer: Medicare PPO

## 2021-08-18 ENCOUNTER — Telehealth: Payer: Self-pay | Admitting: Cardiovascular Disease

## 2021-08-18 ENCOUNTER — Telehealth: Payer: Self-pay | Admitting: Podiatry

## 2021-08-18 ENCOUNTER — Other Ambulatory Visit: Payer: Self-pay

## 2021-08-18 ENCOUNTER — Ambulatory Visit: Payer: Medicare PPO

## 2021-08-18 DIAGNOSIS — M6281 Muscle weakness (generalized): Secondary | ICD-10-CM

## 2021-08-18 DIAGNOSIS — M25571 Pain in right ankle and joints of right foot: Secondary | ICD-10-CM

## 2021-08-18 DIAGNOSIS — R262 Difficulty in walking, not elsewhere classified: Secondary | ICD-10-CM

## 2021-08-18 NOTE — Telephone Encounter (Signed)
Noted thanks for letting me know, definitely should see her cardiologist soon and take her metoprolol as directed ?

## 2021-08-18 NOTE — Telephone Encounter (Signed)
  [facility] states HR ranges from 27 to 114 at rest  Patient c/o Palpitations:  High priority if patient c/o lightheadedness, shortness of breath, or chest pain  How long have you had palpitations/irregular HR/ Afib? Are you having the symptoms now? Yes, in afib currently  Are you currently experiencing lightheadedness, SOB or CP? no  Do you have a history of afib (atrial fibrillation) or irregular heart rhythm? no  Have you checked your BP or HR? (document readings if available): HR is 114  Are you experiencing any other symptoms? No other symptoms

## 2021-08-18 NOTE — Telephone Encounter (Signed)
Incoming call triage call received. ?Spoke with Jerome with [facility]. ?The patient is currently in their office. ?Patient reports being in AFIB by the recording on her applewatch. ?Patient is asymptomatic. ? ?Patients HR being recorded was taken using an automatic BP machine and pulse ox. ?Durward Parcel that if the patient is in AFIB I doubt the readings were accurate. ?Patient is anticoagulated with Eliquis. She has not taken her Metoprolol for 24 hours stating that she ran out. ?Patient sts that her husband had picked up her refill and is waiting on her in the waiting room. ? ?Durward Parcel to instruct the patient to take her Metoprolol asap. Patient should continue to monitor her BP and contact ou office if her HR  remains consistently elevated. ? ?Miguel verbalized understanding and voiced appreciation for the assistance. ? ? ? ?

## 2021-08-18 NOTE — Therapy (Signed)
? ?OUTPATIENT PHYSICAL THERAPY TREATMENT NOTE ? ? ?Patient Name: Kathy Long ?MRN: [id number] ?DOB:11-20-1950, 71 y.o., female ?Today's Date: 08/18/2021 ? ?PCP: [name], MD ?REFERRING PROVIDER: Criselda Peaches, DPM ? ? PT End of Session - 08/18/21 1022   ? ? Visit Number 5   ? Number of Visits 25   ? Date for PT Re-Evaluation 10/22/21   ? Authorization Type 5   ? Authorization Time Period of 10 progress report   ? PT Start Time 1022   ? PT Stop Time 1108   ? PT Time Calculation (min) 46 min   ? Equipment Utilized During Treatment --   wc, rw  ? Activity Tolerance Patient tolerated treatment well   ? Behavior During Therapy [facility] for tasks assessed/performed   ? ?  ?  ? ?  ? ? ?Past Medical History:  ?Diagnosis Date  ? 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 Anxiety   ? Arthritis   ? lower back, hands  ? Cancer [facility])   ? melanoma, shoulder  ? Chronic kidney disease   ? Depression   ? GERD (gastroesophageal reflux disease)   ? Hyperlipidemia   ? Hypertension   ? Postoperative bile leak   ? Sleep apnea   ? 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 ?Past Surgical History:  ?Procedure Laterality Date  ? ANKLE FRACTURE SURGERY Left   ? APPENDECTOMY  2010  ? BACK SURGERY    ? disectomy lumbar, scar tissue  ? BREAST CYST EXCISION Right 1970  ? axilla  ? buninectomy Bilateral   ? CATARACT EXTRACTION W/PHACO Left 04/12/2018  ? Procedure: CATARACT EXTRACTION PHACO AND INTRAOCULAR LENS PLACEMENT ([place])  LEFT TORIC LENS;  Surgeon: Leandrew Koyanagi, MD;  Location: [place];  Service: Ophthalmology;  Laterality: Left;  ? CATARACT EXTRACTION W/PHACO Right 05/10/2018  ? Procedure: CATARACT EXTRACTION PHACO AND INTRAOCULAR LENS PLACEMENT ([place])  RIGHT TORIC LENS;  Surgeon: Leandrew Koyanagi, MD;  Location: [place];  Service: Ophthalmology;  Laterality: Right;  DIABETIC  ? CHOLECYSTECTOMY N/A 07/05/2017  ? Procedure: LAPAROSCOPIC CHOLECYSTECTOMY WITH INTRAOPERATIVE CHOLANGIOGRAM;  Surgeon: Robert Bellow, MD;  Location: [facility];  Service: General;  Laterality: N/A;  ? COLONOSCOPY    ? 2009 and color gaurd in 2018  ? COLONOSCOPY N/A 03/25/2020  ? Procedure: COLONOSCOPY;  Surgeon: [name], MD;  Location[facility];  Service: Endoscopy;  Laterality: N/A;  ? DRUG INDUCED ENDOSCOPY N/A 05/06/2021  ? Procedure: DRUG INDUCED SLEEP ENDOSCOPY;  Surgeon: Melida Quitter, MD;  Location: [place];  Service: ENT;  Laterality: N/A;  ? ERCP N/A 07/12/2017  ? Procedure: ENDOSCOPIC RETROGRADE CHOLANGIOPANCREATOGRAPHY (ERCP);  Surgeon: Lucilla Lame, MD;  Location: [facility] ENDOSCOPY;  Service: Endoscopy;  Laterality: N/A;  ? ERCP N/A 10/04/2017  ? Procedure: ENDOSCOPIC RETROGRADE CHOLANGIOPANCREATOGRAPHY (ERCP);  Surgeon: Lucilla Lame, MD;  Location: [facility] ENDOSCOPY;  Service: Endoscopy;  Laterality: N/A;  ? FOOT SURGERY    ? x 2  ? INCISIONAL HERNIA REPAIR N/A 12/04/2020  ? Procedure: VENTRAL INCISIONAL HERNIA REPAIR WITH MESH;  Surgeon: Armandina Gemma, MD;  Location: [facility];  Service: General;  Laterality: N/A;  ? MELANOMA EXCISION Left 2000  ? PALATOPLASTY N/A 04/08/2015  ? Procedure: PALATOPLASTY;  Surgeon: Beverly Gust, MD;  Location: [facility];  Service: ENT;  Laterality: N/A;  ? TONSILLECTOMY N/A 04/08/2015  ? Procedure: TONSILLECTOMY;  Surgeon: Beverly Gust, MD;  Location: [facility];  Service: ENT;  Laterality: N/A;  ? TUBAL LIGATION    ? ?Patient Active Problem List  ? Diagnosis Date Noted  ? Aortic atherosclerosis ([place]) 05/12/2021  ? Acute COVID-19 05/12/2021  ? Positive colorectal cancer screening using Cologuard test 02/05/2020  ? Esophageal dysphagia 12/11/2019  ? Hyperlipidemia LDL goal <100 12/10/2019  ? Right shoulder pain 10/17/2019  ? Arthritis of right acromioclavicular joint 08/15/2019  ? Piriformis syndrome of left side 08/15/2019  ? OSA (obstructive sleep apnea) 05/13/2019  ? CKD (chronic kidney disease) stage 3, GFR 30-59 ml/min (HCC) 04/13/2018  ? Closed fracture of distal lateral  malleolus of left ankle 12/28/2017  ? 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 S/P laparoscopic cholecystectomy 07/19/2017  ? Hospital discharge follow-up 07/19/2017  ? Acoustic neuroma ([place]) 06/12/2017  ? Colon cancer screening 03/08/2017  ? Nocturia 12/09/2016  ? Major depressive disorder, 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[place]) 06/05/2016  ? Encounter for preventive health examination 03/07/2015  ? Incisional hernia, without obstruction or gangrene 09/28/2014  ? Obesity 09/28/2014  ? Anxiety state 11/19/2013  ? Gastro-esophageal reflux disease without esophagitis 11/19/2013  ? Squamous cell carcinoma of skin of face 10/21/2011  ? 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 Melanoma ([place]) 11/02/2010  ? Essential hypertension 04/10/2010  ? Osteoporosis 04/10/2010  ? ? ?REFERRING DIAG: Z98.890 (ICD-10-CM) - S/P foot surgery, right ? ?THERAPY DIAG:  ?Pain in right ankle and joints of right foot  - Primary M25.571   ?Difficulty in walking, not elsewhere classified  R26.2   ?Muscle weakness (generalized)  M62.81   ? ? ?PERTINENT HISTORY: S/P R subtalar and talonavicular joint arthrodesis 06/05/2021. Has been having foot pain since May 2022. Has been in a boot or something since then like and ace bandage. Pt push a luggage rack [place] a [place] May 2022 which bothered her foot. Had injections, had x-rays, nothing worked. Had to get surgery. Currently on elequis and metoprolol. Got an apple watch to watch her heart rate. Has Afib once every 24-36 hours. Has not yet had PT since her procedure. Also has an acoustic neuroma L ear which affects her balance, therefore she's using a wheel chair. Pt fell in the bathroom June 06, 2021. Hit her head but her R foot is fine. Head is ok. MD told her that she can weight bear with her CAM boot on Monday August 03, 2021. Start out walking gingerly to see if there is pain, then gradually increase weight for 2-3 days, then midweek should be fine  with full weight bearing. ? ?PRECAUTIONS: atrial fibrillation ? ?SUBJECTIVE: Currently in Afib. Has not had her metoprolol since 1 day ago.  ? ?PAIN:  ?Are you having pain? Yes: R lateral ankle, no pain level provided. ? ? ? ? ?Objective ? ? ?[email or web address] ?  ?08/18/2021 ? ?  ?Therapeutic exercise ? ?Blood pressure L arm sitting, mechanically taken, normal cuff 115/80, HR 60 (pt apple watch states 117 BPM average) ? ?Had sinus headache this morning.  ? ? ? ?HR via SpO2: 27-117 BPM at rest  ? ?Asymptomatic per pt ? ?Per pt, CPR can be performed if there is an emergency.  ? ? ? ?Called Dr. Roxy Manns MD office in [place], left a message with his front desk representative pertaining to her Afib. She said she will pass the message to MD ? ?Called Dr Candis Musa and informed him as well via his front desk representative. Per nurse, if asymptomatic, she can go hom but take  her metoprolol ASAP [facility]att[facility] Nurse) ? ? ? ? ? ? ?  ?Response to treatment ?Pt tolerated session well without aggravation of symptoms.  ?  ? ?  ? ? ?PATIENT EDUCATION: ?Education details: Cardiac nurse instructions ?Person educated: Patient and Spouse ?Education method: Explanation ?Education comprehension: verbalized understanding and returned demonstration ? ? ?HOME EXERCISE PROGRAM: ?Access Code: [email or web address] ?URL: [email or web address] ?Date: 08/18/2021 ?Prepared by: Joneen Boers ? ?Exercises ?Supine Ankle Pumps - 5 x daily - 7 x weekly - 3 sets - 10 reps ?Supine Active Straight Leg Raise - 1 x daily - 7 x weekly - 3 sets - 10 reps ?Sidelying Hip Abduction - 1 x daily - 7 x weekly - 3 sets - 5 reps ?Supine Posterior Pelvic Tilt - 1 x daily - 7 x weekly - 3 sets - 10 reps - 5 seconds hold ? ? ? ? ? ? ? ? ? PT Short Term Goals  ? ?  ? PT SHORT TERM GOAL #1  ? Title Pt will be independent with her initial HEP to improve ankle AROM, R LE strength, and ability to ambulate with less difficulty.   ? Baseline Pt has started her HEP (07/29/2021)   ?  Time 3   ? Period Weeks   ? Status New   ? Target Date 08/20/21   ? ?  ?  ? ?  ? ? ? PT Long Term Goals   ? ?  ? PT LONG TERM GOAL #1  ? Title Pt will improve R ankle DF AROM to 15 degrees and PF to 50

## 2021-08-18 NOTE — Telephone Encounter (Signed)
Patient currently in AFIB, they are not going to do any PT today, she is unsympathetic. Her heart rate today has been  HR 37-114, patient has not taken her heart medication (metoprolol ) for a day, she has been out her husband is going to go pick it up today.  They are also going to notify her cardiology office .

## 2021-08-19 ENCOUNTER — Other Ambulatory Visit: Payer: Self-pay | Admitting: Internal Medicine

## 2021-08-19 NOTE — Therapy (Signed)
? ?OUTPATIENT PHYSICAL THERAPY TREATMENT NOTE ? ? ?Patient Name: Kathy Long ?MRN: [id number] ?DOB:[birth year], 71 y.o., female ?Today's Date: 08/20/2021 ? ?PCP: [name], MD ?REFERRING PROVIDER: Criselda Peaches, DPM ? ? PT End of Session - 08/20/21 1052   ? ? Visit Number 6   ? Number of Visits 25   ? Date for PT Re-Evaluation 10/22/21   ? Authorization Type 6   ? Authorization Time Period of 10 progress report   ? PT Start Time 1015   ? PT Stop Time 1100   ? PT Time Calculation (min) 45 min   ? Equipment Utilized During Treatment Gait belt   SPC and RW  ? Activity Tolerance Patient tolerated treatment well   ? Behavior During Therapy [facility] for tasks assessed/performed   ? ?  ?  ? ?  ? ? ? ?Past Medical History:  ?Diagnosis Date  ? 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 Anxiety   ? Arthritis   ? lower back, hands  ? Cancer [facility])   ? melanoma, shoulder  ? Chronic kidney disease   ? Depression   ? GERD (gastroesophageal reflux disease)   ? Hyperlipidemia   ? Hypertension   ? Postoperative bile leak   ? Sleep apnea   ? 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 ?Past Surgical History:  ?Procedure Laterality Date  ? ANKLE FRACTURE SURGERY Left   ? APPENDECTOMY  2010  ? BACK SURGERY    ? disectomy lumbar, scar tissue  ? BREAST CYST EXCISION Right 1970  ? axilla  ? buninectomy Bilateral   ? CATARACT EXTRACTION W/PHACO Left 04/12/2018  ? Procedure: CATARACT EXTRACTION PHACO AND INTRAOCULAR LENS PLACEMENT ([place])  LEFT TORIC LENS;  Surgeon: Leandrew Koyanagi, MD;  Location: [place];  Service: Ophthalmology;  Laterality: Left;  ? CATARACT EXTRACTION W/PHACO Right 05/10/2018  ? Procedure: CATARACT EXTRACTION PHACO AND INTRAOCULAR LENS PLACEMENT ([place])  RIGHT TORIC LENS;  Surgeon: Leandrew Koyanagi, MD;  Location: [place];  Service: Ophthalmology;  Laterality: Right;  DIABETIC  ? CHOLECYSTECTOMY N/A 07/05/2017  ? Procedure: LAPAROSCOPIC CHOLECYSTECTOMY WITH INTRAOPERATIVE CHOLANGIOGRAM;  Surgeon:  Robert Bellow, MD;  Location: [facility];  Service: General;  Laterality: N/A;  ? COLONOSCOPY    ? 2009 and color gaurd in 2018  ? COLONOSCOPY N/A 03/25/2020  ? Procedure: COLONOSCOPY;  Surgeon: [name], MD;  Location: [facility] ENDOSCOPY;  Service: Endoscopy;  Laterality: N/A;  ? DRUG INDUCED ENDOSCOPY N/A 05/06/2021  ? Procedure: DRUG INDUCED SLEEP ENDOSCOPY;  Surgeon: Melida Quitter, MD;  Location: [place];  Service: ENT;  Laterality: N/A;  ? ERCP N/A 07/12/2017  ? Procedure: ENDOSCOPIC RETROGRADE CHOLANGIOPANCREATOGRAPHY (ERCP);  Surgeon: Lucilla Lame, MD;  Location: [facility] ENDOSCOPY;  Service: Endoscopy;  Laterality: N/A;  ? ERCP N/A 10/04/2017  ? Procedure: ENDOSCOPIC RETROGRADE CHOLANGIOPANCREATOGRAPHY (ERCP);  Surgeon: Lucilla Lame, MD;  Location[facility];  Service: Endoscopy;  Laterality: N/A;  ? FOOT SURGERY    ? x 2  ? INCISIONAL HERNIA REPAIR N/A 12/04/2020  ? Procedure: VENTRAL INCISIONAL HERNIA REPAIR WITH MESH;  Surgeon: Armandina Gemma, MD;  [facility];  Service: General;  Laterality: N/A;  ? MELANOMA EXCISION Left 2000  ? PALATOPLASTY N/A 04/08/2015  ? Procedure: PALATOPLASTY;  Surgeon: Beverly Gust, MD;  Location: [facility];  Service: ENT;  Laterality: N/A;  ? TONSILLECTOMY N/A 04/08/2015  ? Procedure: TONSILLECTOMY;  Surgeon: Beverly Gust, MD;  Location: [facility];  Service: ENT;  Laterality: N/A;  ? TUBAL LIGATION    ? ?Patient Active Problem List  ? Diagnosis Date Noted  ? Aortic atherosclerosis ([place]) 05/12/2021  ? Acute COVID-19 05/12/2021  ? Positive colorectal cancer screening using Cologuard test 02/05/2020  ? Esophageal dysphagia 12/11/2019  ? Hyperlipidemia LDL goal <100 12/10/2019  ? Right shoulder pain 10/17/2019  ? Arthritis of right acromioclavicular joint 08/15/2019  ? Piriformis syndrome of left side 08/15/2019  ? OSA (obstructive sleep apnea) 05/13/2019  ? CKD (chronic kidney disease) stage 3, GFR 30-59 ml/min (HCC) 04/13/2018  ? Closed fracture  of distal lateral malleolus of left ankle 12/28/2017  ? 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 S/P laparoscopic cholecystectomy 07/19/2017  ? Hospital discharge follow-up 07/19/2017  ? Acoustic neuroma ([place]) 06/12/2017  ? Colon cancer screening 03/08/2017  ? Nocturia 12/09/2016  ? Major depressive disorder, 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[place]) 06/05/2016  ? Encounter for preventive health examination 03/07/2015  ? Incisional hernia, without obstruction or gangrene 09/28/2014  ? Obesity 09/28/2014  ? Anxiety state 11/19/2013  ? Gastro-esophageal reflux disease without esophagitis 11/19/2013  ? Squamous cell carcinoma of skin of face 10/21/2011  ? 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 Melanoma ([place]) 11/02/2010  ? Essential hypertension 04/10/2010  ? Osteoporosis 04/10/2010  ? ? ?REFERRING DIAG: Z98.890 (ICD-10-CM) - S/P foot surgery, right ? ?THERAPY DIAG:  ?Pain in right ankle and joints of right foot  - Primary M25.571   ?Difficulty in walking, not elsewhere classified  R26.2   ?Muscle weakness (generalized)  M62.81   ? ? ?PERTINENT HISTORY: S/P R subtalar and talonavicular joint arthrodesis 06/05/2021. Has been having foot pain since May 2022. Has been in a boot or something since then like and ace bandage. Pt push [place] luggage rack [place] the [place]22 which bothered her foot. Had injections, had x-rays, nothing worked. Had to get surgery. Currently on elequis and metoprolol. Got an apple watch to watch her heart rate. Has Afib once every 24-36 hours. Has not yet had PT since her procedure. Also has an acoustic neuroma L ear which affects her balance, therefore she's using a wheel chair. Pt fell in the bathroom June 06, 2021. Hit her head but her R foot is fine. Head is ok. MD told her that she can weight bear with her CAM boot on Monday August 03, 2021. Start out walking gingerly to see if there is pain, then gradually increase weight for 2-3 days, then  midweek should be fine with full weight bearing. ? ?PRECAUTIONS: atrial fibrillation ? ?SUBJECTIVE: Pt reports taking medication for HR control. Reports her R foot feels better when she is not wearing the boot.  ? ?PAIN:  ?Are you having pain? : R lateral ankle, no pain level provided. ? ? ? ? ?Objective ? ? ?[email or web address] ?  ?08/20/2021 ? ?  ? ?Blood pressure L arm sitting, mechanically taken, normal cuff: 128/71 mm Hg, HR 60  ? ? ?Therapeutic exercise: ?  ?Supine R ankle DF/PF 2x20 ? ?Supine R ankle inver/ever: 2x20, intermittent TC's to assist in correct lateral motions ?  ?Supine toe curles 1x20 with 5 second holds  ?             ?Supine toe spreads. 1x20 seconds for 5 sec holds ? ? ? ? ?Gait training:  ?  ?Gait with SPC: 100' with step to pattern and CGA. Brief PT demo prior to performance. Good stability  throughout with no LOB.  ? ?Gait with SPC: 2x100' with step through pattern with CGA. Initial increased time with step through on L foot before progression of RLE. After ~50' pt able to perform consistent, reciprocal gait without long pause for balance. X2 intermittent unsteadiness but pt able to recover without external assist. Seated rest b/t bout with HR check at 61- 63 BPM with SPO2 at 97%.  ? ?Gait speed with SPC and step through gait: 0.76 m/s ? ? ? ?  ?Response to treatment ?Pt tolerated session well without aggravation of symptoms.  ?  ? ?  ? ? ?PATIENT EDUCATION: ?Education details: gait mechanics, gait speed with SPC, and form/technique with exercise ?Person educated: Patient ?Education method: Explanation, demonstration, tactile cues and verbal cues ?Education comprehension: verbalized understanding and returned demonstration ? ? ?HOME EXERCISE PROGRAM: ?Access Code: [email or web address] ?URL: [email or web address] ?Date: 08/18/2021 ?Prepared by: Joneen Boers ? ?Exercises ?Supine Ankle Pumps - 5 x daily - 7 x weekly - 3 sets - 10 reps ?Supine Active Straight Leg Raise - 1 x daily - 7 x weekly  - 3 sets - 10 reps ?Sidelying Hip Abduction - 1 x daily - 7 x weekly - 3 sets - 5 reps ?Supine Posterior Pelvic Tilt - 1 x daily - 7 x weekly - 3 sets - 10 reps - 5 seconds hold ? ? ? ? ? ? ? ? ? PT Short Term

## 2021-08-20 ENCOUNTER — Other Ambulatory Visit: Payer: Self-pay

## 2021-08-20 ENCOUNTER — Ambulatory Visit: Payer: Medicare PPO

## 2021-08-20 DIAGNOSIS — R262 Difficulty in walking, not elsewhere classified: Secondary | ICD-10-CM

## 2021-08-20 DIAGNOSIS — M25571 Pain in right ankle and joints of right foot: Secondary | ICD-10-CM

## 2021-08-20 DIAGNOSIS — M6281 Muscle weakness (generalized): Secondary | ICD-10-CM | POA: Diagnosis not present

## 2021-08-20 DIAGNOSIS — Z9889 Other specified postprocedural states: Secondary | ICD-10-CM | POA: Diagnosis not present

## 2021-08-24 ENCOUNTER — Ambulatory Visit: Payer: Medicare PPO

## 2021-08-26 ENCOUNTER — Other Ambulatory Visit: Payer: Self-pay

## 2021-08-26 ENCOUNTER — Ambulatory Visit: Payer: Medicare PPO

## 2021-08-26 DIAGNOSIS — R262 Difficulty in walking, not elsewhere classified: Secondary | ICD-10-CM

## 2021-08-26 DIAGNOSIS — M6281 Muscle weakness (generalized): Secondary | ICD-10-CM | POA: Diagnosis not present

## 2021-08-26 DIAGNOSIS — M25571 Pain in right ankle and joints of right foot: Secondary | ICD-10-CM | POA: Diagnosis not present

## 2021-08-26 DIAGNOSIS — Z9889 Other specified postprocedural states: Secondary | ICD-10-CM | POA: Diagnosis not present

## 2021-08-26 NOTE — Therapy (Signed)
? ?OUTPATIENT PHYSICAL THERAPY TREATMENT NOTE ? ? ?Patient Name: Kathy Long ?MRN: [id number] ?DOB:[birth year], 71 y.o., female ?Today's Date: 08/26/2021 ? ?PCP: [name], MD ?REFERRING PROVIDER: Criselda Peaches, DPM ? ? PT End of Session - 08/26/21 0947   ? ? Visit Number 7   ? Number of Visits 25   ? Date for PT Re-Evaluation 10/22/21   ? Authorization Type 7   ? Authorization Time Period of 10 progress report   ? PT Start Time [phone]   ? PT Stop Time 0930   ? PT Time Calculation (min) 39 min   ? Equipment Utilized During Treatment Gait belt   SPC and RW  ? Activity Tolerance Patient tolerated treatment well   ? Behavior During Therapy [facility] for tasks assessed/performed   ? ?  ?  ? ?  ? ? ? ?Past Medical History:  ?Diagnosis Date  ? 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 Anxiety   ? Arthritis   ? lower back, hands  ? Cancer [facility])   ? melanoma, shoulder  ? Chronic kidney disease   ? Depression   ? GERD (gastroesophageal reflux disease)   ? Hyperlipidemia   ? Hypertension   ? Postoperative bile leak   ? Sleep apnea   ? 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 ?Past Surgical History:  ?Procedure Laterality Date  ? ANKLE FRACTURE SURGERY Left   ? APPENDECTOMY  2010  ? BACK SURGERY    ? disectomy lumbar, scar tissue  ? BREAST CYST EXCISION Right 1970  ? axilla  ? buninectomy Bilateral   ? CATARACT EXTRACTION W/PHACO Left 04/12/2018  ? Procedure: CATARACT EXTRACTION PHACO AND INTRAOCULAR LENS PLACEMENT ([place])  LEFT TORIC LENS;  Surgeon: Leandrew Koyanagi, MD;  Location: [place];  Service: Ophthalmology;  Laterality: Left;  ? CATARACT EXTRACTION W/PHACO Right 05/10/2018  ? Procedure: CATARACT EXTRACTION PHACO AND INTRAOCULAR LENS PLACEMENT ([place])  RIGHT TORIC LENS;  Surgeon: Leandrew Koyanagi, MD;  Location: [place];  Service: Ophthalmology;  Laterality: Right;  DIABETIC  ? CHOLECYSTECTOMY N/A 07/05/2017  ? Procedure: LAPAROSCOPIC CHOLECYSTECTOMY WITH INTRAOPERATIVE CHOLANGIOGRAM;  Surgeon:  Robert Bellow, MD;  Location: [facility];  Service: General;  Laterality: N/A;  ? COLONOSCOPY    ? 2009 and color gaurd in 2018  ? COLONOSCOPY N/A 03/25/2020  ? Procedure: COLONOSCOPY;  Surgeon: [name], MD;  Location: [facility] ENDOSCOPY;  Service: Endoscopy;  Laterality: N/A;  ? DRUG INDUCED ENDOSCOPY N/A 05/06/2021  ? Procedure: DRUG INDUCED SLEEP ENDOSCOPY;  Surgeon: Melida Quitter, MD;  Location: [place];  Service: ENT;  Laterality: N/A;  ? ERCP N/A 07/12/2017  ? Procedure: ENDOSCOPIC RETROGRADE CHOLANGIOPANCREATOGRAPHY (ERCP);  Surgeon: Lucilla Lame, MD;  Location: [facility] ENDOSCOPY;  Service: Endoscopy;  Laterality: N/A;  ? ERCP N/A 10/04/2017  ? Procedure: ENDOSCOPIC RETROGRADE CHOLANGIOPANCREATOGRAPHY (ERCP);  Surgeon: Lucilla Lame, MD;  Location: [facility] ENDOSCOPY;  Service: Endoscopy;  Laterality: N/A;  ? FOOT SURGERY    ? x 2  ? INCISIONAL HERNIA REPAIR N/A 12/04/2020  ? Procedure: VENTRAL INCISIONAL HERNIA REPAIR WITH MESH;  Surgeon: Armandina Gemma, MD;  Location: [facility];  Service: General;  Laterality: N/A;  ? MELANOMA EXCISION Left 2000  ? PALATOPLASTY N/A 04/08/2015  ? Procedure: PALATOPLASTY;  Surgeon: Beverly Gust, MD;  Location: [facility];  Service: ENT;  Laterality: N/A;  ? TONSILLECTOMY N/A 04/08/2015  ? Procedure: TONSILLECTOMY;  Surgeon: Beverly Gust, MD;  Location: [facility];  Service: ENT;  Laterality: N/A;  ? TUBAL LIGATION    ? ?Patient Active Problem List  ? Diagnosis Date Noted  ? Aortic atherosclerosis ([place]) 05/12/2021  ? Acute COVID-19 05/12/2021  ? Positive colorectal cancer screening using Cologuard test 02/05/2020  ? Esophageal dysphagia 12/11/2019  ? Hyperlipidemia LDL goal <100 12/10/2019  ? Right shoulder pain 10/17/2019  ? Arthritis of right acromioclavicular joint 08/15/2019  ? Piriformis syndrome of left side 08/15/2019  ? OSA (obstructive sleep apnea) 05/13/2019  ? CKD (chronic kidney disease) stage 3, GFR 30-59 ml/min (HCC) 04/13/2018  ? Closed fracture  of distal lateral malleolus of left ankle 12/28/2017  ? 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 S/P laparoscopic cholecystectomy 07/19/2017  ? Hospital discharge follow-up 07/19/2017  ? Acoustic neuroma ([place]) 06/12/2017  ? Colon cancer screening 03/08/2017  ? Nocturia 12/09/2016  ? Major depressive disorder, 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[place]) 06/05/2016  ? Encounter for preventive health examination 03/07/2015  ? Incisional hernia, without obstruction or gangrene 09/28/2014  ? Obesity 09/28/2014  ? Anxiety state 11/19/2013  ? Gastro-esophageal reflux disease without esophagitis 11/19/2013  ? Squamous cell carcinoma of skin of face 10/21/2011  ? 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 Melanoma ([place]) 11/02/2010  ? Essential hypertension 04/10/2010  ? Osteoporosis 04/10/2010  ? ? ?REFERRING DIAG: Z98.890 (ICD-10-CM) - S/P foot surgery, right ? ?THERAPY DIAG:  ?Pain in right ankle and joints of right foot  - Primary M25.571   ?Difficulty in walking, not elsewhere classified  R26.2   ?Muscle weakness (generalized)  M62.81   ? ? ?PERTINENT HISTORY: S/P R subtalar and talonavicular joint arthrodesis 06/05/2021. Has been having foot pain since May 2022. Has been in a boot or something since then like and ace bandage. Pt push a luggage rack [place] 2022 which bothered her foot. Had injections, had x-rays, nothing worked. Had to get surgery. Currently on elequis and metoprolol. Got an apple watch to watch her heart rate. Has Afib once every 24-36 hours. Has not yet had PT since her procedure. Also has an acoustic neuroma L ear which affects her balance, therefore she's using a wheel chair. Pt fell in the bathroom June 06, 2021. Hit her head but her R foot is fine. Head is ok. MD told her that she can weight bear with her CAM boot on Monday August 03, 2021. Start out walking gingerly to see if there is pain, then gradually increase weight for 2-3 days, then  midweek should be fine with full weight bearing. ? ?PRECAUTIONS: atrial fibrillation ? ?SUBJECTIVE: Has not been using her rw or AD at times at home. Feels better without using AD. Worked on the cane the other day and went well. Afib is better. Had a couple of episodes at night which lasted less than an hour. Took her metoprolol last night. Taking her meds religiously.  ? ?PAIN:  ?Are you having pain? Yes: R lateral ankle, no pain level provided. ? ? ? ? ?Objective ? ? ?[email or web address] ?  ?08/26/2021 ? ?  ?Therapeutic exercise ? ?HR at start of session: 67-69 bpm ?WIth CAM boot on: ?Gait around gym without AD 100 ft. Steady, no LOB ?Side stepping 32 ft to the R and 32 ft to the L  ? Then with yellow band around distal thighs. Good glute med muscle use felt ? ?LAQ 10x10 seconds  ? ?Hooklying hip ext leg straight  ? R 10x5 seconds for  3 sets ? ? ?SLR hip flexion R 10x2 ? ?S/L hip abduction  ? R 10x2 ? ?Supine toe curles 10x3 with 5 second holds  ?          ?  ?Supine ankle DF/PF 10x2 ?  ?   ?Supine toe spreads. 10x5 seconds for 3 sets ?  ? ? ? ?Improved exercise technique, movement at target joints, use of target muscles after mod verbal, visual, tactile cues.  ? ? ? ? ? ? ? ? ? ? ?  ?Response to treatment ?Pt tolerated session well without aggravation of symptoms.  ?  ? ?  ? ? ?PATIENT EDUCATION: ?Education details: Cardiac nurse instructions ?Person educated: Patient and Spouse ?Education method: Explanation ?Education comprehension: verbalized understanding and returned demonstration ? ? ?HOME EXERCISE PROGRAM: ?Access Code: [email or web address] ?URL: [email or web address] ?Date: 08/18/2021 ?Prepared by: Joneen Boers ? ?Exercises ?Supine Ankle Pumps - 5 x daily - 7 x weekly - 3 sets - 10 reps ?Supine Active Straight Leg Raise - 1 x daily - 7 x weekly - 3 sets - 10 reps ?Sidelying Hip Abduction - 1 x daily - 7 x weekly - 3 sets - 5 reps ?Supine Posterior Pelvic Tilt - 1 x daily - 7 x weekly - 3 sets - 10 reps -  5 seconds hold ? ? ? ? ? ? ? ? ? PT Short Term Goals  ? ?  ? PT SHORT TERM GOAL #1  ? Title Pt will be independent with her initial HEP to improve ankle AROM, R LE strength, and ability to ambulate with less d

## 2021-08-31 ENCOUNTER — Ambulatory Visit (INDEPENDENT_AMBULATORY_CARE_PROVIDER_SITE_OTHER): Payer: Medicare PPO | Admitting: Podiatry

## 2021-08-31 ENCOUNTER — Ambulatory Visit (INDEPENDENT_AMBULATORY_CARE_PROVIDER_SITE_OTHER): Payer: Medicare PPO

## 2021-08-31 DIAGNOSIS — M2011 Hallux valgus (acquired), right foot: Secondary | ICD-10-CM | POA: Diagnosis not present

## 2021-08-31 DIAGNOSIS — M21611 Bunion of right foot: Secondary | ICD-10-CM

## 2021-08-31 DIAGNOSIS — M66871 Spontaneous rupture of other tendons, right ankle and foot: Secondary | ICD-10-CM

## 2021-08-31 DIAGNOSIS — M19279 Secondary osteoarthritis, unspecified ankle and foot: Secondary | ICD-10-CM

## 2021-08-31 DIAGNOSIS — Z9889 Other specified postprocedural states: Secondary | ICD-10-CM

## 2021-08-31 DIAGNOSIS — Q666 Other congenital valgus deformities of feet: Secondary | ICD-10-CM

## 2021-09-01 ENCOUNTER — Ambulatory Visit: Payer: Medicare PPO | Attending: Podiatry

## 2021-09-01 DIAGNOSIS — M25571 Pain in right ankle and joints of right foot: Secondary | ICD-10-CM | POA: Insufficient documentation

## 2021-09-01 DIAGNOSIS — R262 Difficulty in walking, not elsewhere classified: Secondary | ICD-10-CM | POA: Diagnosis not present

## 2021-09-01 DIAGNOSIS — M6281 Muscle weakness (generalized): Secondary | ICD-10-CM | POA: Diagnosis not present

## 2021-09-01 NOTE — Therapy (Signed)
? ?OUTPATIENT PHYSICAL THERAPY TREATMENT NOTE ? ? ?Patient Name: Kathy Long ?MRN: [id number] ?DOB:09-02-1950, 71 y.o., female ?Today's Date: 09/01/2021 ? ?PCP: [name], MD ?REFERRING PROVIDER: Criselda Peaches, DPM ? ? PT End of Session - 09/01/21 1641   ? ? Visit Number 8   ? Number of Visits 25   ? Date for PT Re-Evaluation 10/22/21   ? Authorization Type 8   ? Authorization Time Period of 10 progress report   ? PT Start Time 1751   ? PT Stop Time 1723   ? PT Time Calculation (min) 42 min   ? Equipment Utilized During Treatment --   SPC and RW  ? Activity Tolerance Patient tolerated treatment well   ? Behavior During Therapy [facility] for tasks assessed/performed   ? ?  ?  ? ?  ? ? ? ? ?Past Medical History:  ?Diagnosis Date  ? 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 Anxiety   ? Arthritis   ? lower back, hands  ? Cancer [facility])   ? melanoma, shoulder  ? Chronic kidney disease   ? Depression   ? GERD (gastroesophageal reflux disease)   ? Hyperlipidemia   ? Hypertension   ? Postoperative bile leak   ? Sleep apnea   ? 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 ?Past Surgical History:  ?Procedure Laterality Date  ? ANKLE FRACTURE SURGERY Left   ? APPENDECTOMY  2010  ? BACK SURGERY    ? disectomy lumbar, scar tissue  ? BREAST CYST EXCISION Right 1970  ? axilla  ? buninectomy Bilateral   ? CATARACT EXTRACTION W/PHACO Left 04/12/2018  ? Procedure: CATARACT EXTRACTION PHACO AND INTRAOCULAR LENS PLACEMENT ([place])  LEFT TORIC LENS;  Surgeon: Leandrew Koyanagi, MD;  Location: [place];  Service: Ophthalmology;  Laterality: Left;  ? CATARACT EXTRACTION W/PHACO Right 05/10/2018  ? Procedure: CATARACT EXTRACTION PHACO AND INTRAOCULAR LENS PLACEMENT ([place])  RIGHT TORIC LENS;  Surgeon: Leandrew Koyanagi, MD;  Location: [place];  Service: Ophthalmology;  Laterality: Right;  DIABETIC  ? CHOLECYSTECTOMY N/A 07/05/2017  ? Procedure: LAPAROSCOPIC CHOLECYSTECTOMY WITH INTRAOPERATIVE CHOLANGIOGRAM;  Surgeon: Robert Bellow, MD;  Location: [facility];  Service: General;  Laterality: N/A;  ? COLONOSCOPY    ? 2009 and color gaurd in 2018  ? COLONOSCOPY N/A 03/25/2020  ? Procedure: COLONOSCOPY;  Surgeon: [name], MD;  Location: [facility] ENDOSCOPY;  Service: Endoscopy;  Laterality: N/A;  ? DRUG INDUCED ENDOSCOPY N/A 05/06/2021  ? Procedure: DRUG INDUCED SLEEP ENDOSCOPY;  Surgeon: Melida Quitter, MD;  Location: [place];  Service: ENT;  Laterality: N/A;  ? ERCP N/A 07/12/2017  ? Procedure: ENDOSCOPIC RETROGRADE CHOLANGIOPANCREATOGRAPHY (ERCP);  Surgeon: Lucilla Lame, MD;  Location: [facility] ENDOSCOPY;  Service: Endoscopy;  Laterality: N/A;  ? ERCP N/A 10/04/2017  ? Procedure: ENDOSCOPIC RETROGRADE CHOLANGIOPANCREATOGRAPHY (ERCP);  Surgeon: Lucilla Lame, MD;  Location[facility] END[facility]COPY;  Service: Endoscopy;  Laterality: N/A;  ? FOOT SURGERY    ? x 2  ? INCISIONAL HERNIA REPAIR N/A 12/04/2020  ? Procedure: VENTRAL INCISIONAL HERNIA REPAIR WITH MESH;  Surgeon: Armandina Gemma, MD;  Location: [facility];  Service: General;  Laterality: N/A;  ? MELANOMA EXCISION Left 2000  ? PALATOPLASTY N/A 04/08/2015  ? Procedure: PALATOPLASTY;  Surgeon: Beverly Gust, MD;  Location: [facility];  Service: ENT;  Laterality: N/A;  ? TONSILLECTOMY N/A 04/08/2015  ? Procedure: TONSILLECTOMY;  Surgeon: Beverly Gust, MD;  Location: [facility];  Service: ENT;  Laterality: N/A;  ? TUBAL LIGATION    ? ?Patient Active Problem List  ? Diagnosis Date Noted  ? Aortic atherosclerosis ([place]) 05/12/2021  ? Acute COVID-19 05/12/2021  ? Positive colorectal cancer screening using Cologuard test 02/05/2020  ? Esophageal dysphagia 12/11/2019  ? Hyperlipidemia LDL goal <100 12/10/2019  ? Right shoulder pain 10/17/2019  ? Arthritis of right acromioclavicular joint 08/15/2019  ? Piriformis syndrome of left side 08/15/2019  ? OSA (obstructive sleep apnea) 05/13/2019  ? CKD (chronic kidney disease) stage 3, GFR 30-59 ml/min (HCC) 04/13/2018  ? Closed fracture of distal  lateral malleolus of left ankle 12/28/2017  ? 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 S/P laparoscopic cholecystectomy 07/19/2017  ? Hospital discharge follow-up 07/19/2017  ? Acoustic neuroma ([place]) 06/12/2017  ? Colon cancer screening 03/08/2017  ? Nocturia 12/09/2016  ? Major depressive disorder, 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[place]) 06/05/2016  ? Encounter for preventive health examination 03/07/2015  ? Incisional hernia, without obstruction or gangrene 09/28/2014  ? Obesity 09/28/2014  ? Anxiety state 11/19/2013  ? Gastro-esophageal reflux disease without esophagitis 11/19/2013  ? Squamous cell carcinoma of skin of face 10/21/2011  ? 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 Melanoma ([place]) 11/02/2010  ? Essential hypertension 04/10/2010  ? Osteoporosis 04/10/2010  ? ? ?REFERRING DIAG: Z98.890 (ICD-10-CM) - S/P foot surgery, right ? ?THERAPY DIAG:  ?Pain in right ankle and joints of right foot  - Primary M25.571   ?Difficulty in walking, not elsewhere classified  R26.2   ?Muscle weakness (generalized)  M62.81   ? ? ?PERTINENT HISTORY: S/P R subtalar and talonavicular joint arthrodesis 06/05/2021. Has been having foot pain since May 2022. Has been in a boot or something since then like and ace bandage. Pt push [place] luggage rack [place] which bothered her foot. Had injections, had x-rays, nothing worked. Had to get surgery. Currently on elequis and metoprolol. Got an apple watch to watch her heart rate. Has Afib once every 24-36 hours. Has not yet had PT since her procedure. Also has an acoustic neuroma L ear which affects her balance, therefore she's using a wheel chair. Pt fell in the bathroom June 06, 2021. Hit her head but her R foot is fine. Head is ok. MD told her that she can weight bear with her CAM boot on Monday August 03, 2021. Start out walking gingerly to see if there is pain, then gradually increase weight for 2-3 days, then midweek should  be fine with full weight bearing. ? ?PRECAUTIONS: atrial fibrillation ? ?SUBJECTIVE:    R foot is pretty good. Had a little more pain since being out of the boot. Went to the doctor yesterday who discharged her CAM boot but to wear and ankle brace with her shoe. 4/10 R lateral ankle and plantar foot pain during gait currently.  ? ? ?PAIN:  ?Are you having pain? Yes: R lateral ankle and plantar foot 4/10. Eases when sitting and non-weight bearing.  ? ? ? ? ?Objective ? ? ?[email or web address] ?  ?08/26/2021 ? ?  ?Therapeutic exercise ? ?HR at start of session: 68-71 bpm ? ?Regular shoe with brace on:  ? ?Seated R gastroc stretch with strap 30 seconds x 3 ? ?Seated R ankle yellow T-band comfortable range ? DF 10x3 ? PF 10x3   ?Marland Kitchen ?Standing forward gentle weight shift onto Air Ex pad  ? R LE 10x2 with BE UE assist ? ?  Side stepping 32 ft to the R and 32 ft to the L for 2 sets to promote glute and ankle strengthening  ? Good glute med muscle use felt ? ?Static standing forward weight shift with emphasis on ankle DF to foot flat ? R 10x2 ? ?Seated toe picking up folded towel between 1st and 2nd digits 6x to promote intrinsic muscle use.  ? ?Standing bent over glute max extension over table  ? R 5x2 ? ? ? ?Improved exercise technique, movement at target joints, use of target muscles after mod verbal, visual, tactile cues.  ? ? ?  ?Response to treatment ?Pt tolerated session well without aggravation of symptoms.  ?  ? ?Clinical impression ?Pt currently wearing R ankle brace with regular shoe on. Worked on R ankle strengthening with resisted band (DF and PF), as well as closed chain R LE strengthening to promote weight bearing, glute strengthening and heel strike to foot flat phases of gait. Pt tolerated session well without aggravation of symptoms. Pt will benefit from continued skilled physical therapy services to improve strength, balance, and function.  ? ? ? ? ? ?  ? ? ?PATIENT EDUCATION: ?Education details: Cardiac nurse  instructions ?Person educated: Patient and Spouse ?Education method: Explanation ?Education comprehension: verbalized understanding and returned demonstration ? ? ?HOME EXERCISE PROGRAM: ?Access Code:

## 2021-09-02 ENCOUNTER — Encounter: Payer: Self-pay | Admitting: Podiatry

## 2021-09-02 ENCOUNTER — Telehealth: Payer: Self-pay | Admitting: Podiatry

## 2021-09-02 NOTE — Telephone Encounter (Signed)
Pt left message today at 55 asking if we could tell her where she had rented the wheelchair from when she first had her surgery. She has it and does not see a name or number on it to call them to pick it up..  ? ?She is a  pt. ?

## 2021-09-03 ENCOUNTER — Ambulatory Visit: Payer: Medicare PPO

## 2021-09-03 ENCOUNTER — Telehealth: Payer: Self-pay | Admitting: Cardiovascular Disease

## 2021-09-03 NOTE — Telephone Encounter (Signed)
Patient c/o Palpitations:  High priority if patient c/o lightheadedness, shortness of breath, or chest pain ? ?How long have you had palpitations/irregular HR/ Afib? Are you having the symptoms now?  ?Patient states yesterday while in the grocery store she went into afib and experienced fluttering. Last night it finally went away, but this morning she is feeling it again ? ?Are you currently experiencing lightheadedness, SOB or CP?  ?No  ? ?Do you have a history of afib (atrial fibrillation) or irregular heart rhythm?  ?Yes ? ?Have you checked your BP or HR? (document readings if available):  ?HR 64 (7 mins ago) ?83 average HR on apple watch, but patient states yesterday it fluctuated and got up to 120 ? ? ?Are you experiencing any other symptoms?  ?Headache, mild left hand numbness yesterday ? ?

## 2021-09-03 NOTE — Telephone Encounter (Signed)
Called and spoke with patient.  ? ?Patient reports that rate is varying from 60s to 110s. Endorses some SOB, but it is better than it was yesterday. States that 3 months ago she was prescribed flecainide but has never had to use it.  ? ?OV note from 07/26/21 with Dr. Rockey Situ ?For atrial fibrillation episodes that last more than an hour, recommend she take flecainide 50 mg as needed ? ?Advised patient that she take the flecainide, and call us back this afternoon if she still feels poorly and is still in a fib.  ? ?Pt verbalized understanding and voiced appreciation for the call.  ?

## 2021-09-03 NOTE — Telephone Encounter (Signed)
Left message for pt that we do not have that information but she could possibly google it and try to find them. ?

## 2021-09-03 NOTE — Telephone Encounter (Signed)
I'm not sure. I don't believe it was through [place] per [facility] ?

## 2021-09-05 NOTE — Progress Notes (Signed)
?  Subjective:  ?Patient ID: Kathy Long, female    DOB: 29-Sep-1950,  MRN: [id number] ? ?Chief Complaint  ?Patient presents with  ? Routine Post Op  ?  POV #3 DOS 06/05/2020 FUSION OF SUBTARLAR & TALONAVICULAR JOINTS, BONE GRAFT FROM LEG  RT FOOT, PT AND FDL TENODESIS  ? ? ? ? ?71 y.o. female returns for post-op check.  She is doing well.  Not having much pain.  Physical therapy is doing well ? ?Review of Systems: Negative except as noted in the HPI. Denies N/V/F/Ch. ? ? ?Objective:  ?There were no vitals filed for this visit. ?There is no height or weight on file to calculate BMI. ?Constitutional Well developed. ?Well nourished.  ?Vascular Foot warm and well perfused. ?Capillary refill normal to all digits.  Calf is soft and supple, no posterior calf or knee pain, negative Homans' sign  ?Neurologic Normal speech. ?Oriented to person, place, and time. ?Epicritic sensation to light touch grossly present bilaterally.  ?Dermatologic Incisions are well-healed not hypertrophic  ?Orthopedic: Little to no pain.  Good position of the foot  ? ?Multiple view plain film radiographs: Near complete healing across all arthrodesis sites noted, no change in alignment or hardware ?Assessment:  ? ?1. Hallux valgus with bunions, right   ?2. Nontraumatic rupture of right posterior tibial tendon   ?3. Secondary osteoarthritis of talonavicular joint   ?4. S/P foot surgery, right   ?5. Pes planovalgus   ? ? ?Plan:  ?Patient was evaluated and treated and all questions answered. ? ?S/p foot surgery right ?-Overall doing very well.  I would like to transition her out of the cam boot into a regular supportive shoe such as a sneaker with a supportive ankle brace.  She should wear this for 4 to 6 weeks and will work with her therapist on transitioning.  After that may continue regular shoe gear as tolerated.  I will see her back in 3 months for a 68-monthfollow-up and final x-rays. ? ?Return in about 3 months (around 11/30/2021) for surgery follow  up (new x-rays).  ?

## 2021-09-07 ENCOUNTER — Encounter: Payer: Self-pay | Admitting: Internal Medicine

## 2021-09-07 DIAGNOSIS — N1831 Chronic kidney disease, stage 3a: Secondary | ICD-10-CM | POA: Diagnosis not present

## 2021-09-07 DIAGNOSIS — I10 Essential (primary) hypertension: Secondary | ICD-10-CM | POA: Diagnosis not present

## 2021-09-09 ENCOUNTER — Other Ambulatory Visit: Payer: Self-pay | Admitting: Otolaryngology

## 2021-09-09 ENCOUNTER — Ambulatory Visit: Payer: Medicare PPO | Admitting: Internal Medicine

## 2021-09-11 ENCOUNTER — Telehealth: Payer: Self-pay

## 2021-09-11 ENCOUNTER — Encounter: Payer: Medicare PPO | Admitting: Internal Medicine

## 2021-09-11 NOTE — Telephone Encounter (Signed)
LMTCB. Need to reschedule pt's appt for today at 11:30 due to a provider meeting. Please reschedule when pt calls back.  ?

## 2021-09-15 ENCOUNTER — Ambulatory Visit: Payer: Medicare PPO

## 2021-09-15 DIAGNOSIS — R262 Difficulty in walking, not elsewhere classified: Secondary | ICD-10-CM | POA: Diagnosis not present

## 2021-09-15 DIAGNOSIS — M25571 Pain in right ankle and joints of right foot: Secondary | ICD-10-CM | POA: Diagnosis not present

## 2021-09-15 DIAGNOSIS — M6281 Muscle weakness (generalized): Secondary | ICD-10-CM | POA: Diagnosis not present

## 2021-09-15 NOTE — Therapy (Signed)
? ?OUTPATIENT PHYSICAL THERAPY TREATMENT NOTE ? ? ?Patient Name: Kathy Long ?MRN: [id number] ?DOB:1950/07/14, 71 y.o., female ?Today's Date: 09/15/2021 ? ?PCP: [name], MD ?REFERRING PROVIDER: Criselda Peaches, DPM ? ? PT End of Session - 09/15/21 1331   ? ? Visit Number 9   ? Number of Visits 25   ? Date for PT Re-Evaluation 10/22/21   ? Authorization Type 9   ? Authorization Time Period of 10 progress report   ? PT Start Time 1331   ? PT Stop Time 1414   ? PT Time Calculation (min) 43 min   ? Equipment Utilized During Treatment --   SPC and RW  ? Activity Tolerance Patient tolerated treatment well   ? Behavior During Therapy [facility] for tasks assessed/performed   ? ?  ?  ? ?  ? ? ? ? ? ?Past Medical History:  ?Diagnosis Date  ? 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 Anxiety   ? Arthritis   ? lower back, hands  ? Cancer [facility])   ? melanoma, shoulder  ? Chronic kidney disease   ? Depression   ? GERD (gastroesophageal reflux disease)   ? Hyperlipidemia   ? Hypertension   ? Postoperative bile leak   ? Sleep apnea   ? 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 ?Past Surgical History:  ?Procedure Laterality Date  ? ANKLE FRACTURE SURGERY Left   ? APPENDECTOMY  2010  ? BACK SURGERY    ? disectomy lumbar, scar tissue  ? BREAST CYST EXCISION Right 1970  ? axilla  ? buninectomy Bilateral   ? CATARACT EXTRACTION W/PHACO Left 04/12/2018  ? Procedure: CATARACT EXTRACTION PHACO AND INTRAOCULAR LENS PLACEMENT ([place])  LEFT TORIC LENS;  Surgeon: Leandrew Koyanagi, MD;  Location: [place];  Service: Ophthalmology;  Laterality: Left;  ? CATARACT EXTRACTION W/PHACO Right 05/10/2018  ? Procedure: CATARACT EXTRACTION PHACO AND INTRAOCULAR LENS PLACEMENT ([place])  RIGHT TORIC LENS;  Surgeon: Leandrew Koyanagi, MD;  Location: [place];  Service: Ophthalmology;  Laterality: Right;  DIABETIC  ? CHOLECYSTECTOMY N/A 07/05/2017  ? Procedure: LAPAROSCOPIC CHOLECYSTECTOMY WITH INTRAOPERATIVE CHOLANGIOGRAM;  Surgeon: Robert Bellow, MD;  Location: [facility] ORS;  Service: General;  Laterality: N/A;  ? COLONOSCOPY    ? 2009 and color gaurd in 2018  ? COLONOSCOPY N/A 03/25/2020  ? Procedure: COLONOSCOPY;  Surgeon: [name], MD;  Location: [facility] ENDOSCOPY;  Service: Endoscopy;  Laterality: N/A;  ? DRUG INDUCED ENDOSCOPY N/A 05/06/2021  ? Procedure: DRUG INDUCED SLEEP ENDOSCOPY;  Surgeon: Melida Quitter, MD;  Location: [place];  Service: ENT;  Laterality: N/A;  ? ERCP N/A 07/12/2017  ? Procedure: ENDOSCOPIC RETROGRADE CHOLANGIOPANCREATOGRAPHY (ERCP);  Surgeon: Lucilla Lame, MD;  Location: [facility] ENDOSCOPY;  Service: Endoscopy;  Laterality: N/A;  ? ERCP N/A 10/04/2017  ? Procedure: ENDOSCOPIC RETROGRADE CHOLANGIOPANCREATOGRAPHY (ERCP);  Surgeon: Lucilla Lame, MD;  Location[facility] ENDOSCOPY;  Service: Endoscopy;  Laterality: N/A;  ? FOOT SURGERY    ? x 2  ? INCISIONAL HERNIA REPAIR N/A 12/04/2020  ? Procedure: VENTRAL INCISIONAL HERNIA REPAIR WITH MESH;  Surgeon: Armandina Gemma, MD;  Location: [facility];  Service: General;  Laterality: N/A;  ? MELANOMA EXCISION Left 2000  ? PALATOPLASTY N/A 04/08/2015  ? Procedure: PALATOPLASTY;  Surgeon: Beverly Gust, MD;  Location: [facility] ORS;  Service: ENT;  Laterality: N/A;  ? TONSILLECTOMY N/A 04/08/2015  ? Procedure: TONSILLECTOMY;  Surgeon: Beverly Gust, MD;  Location: [facility]  ORS;  Service: ENT;  Laterality: N/A;  ? TUBAL LIGATION    ? ?Patient Active Problem List  ? Diagnosis Date Noted  ? Aortic atherosclerosis ([place]) 05/12/2021  ? Acute COVID-19 05/12/2021  ? Positive colorectal cancer screening using Cologuard test 02/05/2020  ? Esophageal dysphagia 12/11/2019  ? Hyperlipidemia LDL goal <100 12/10/2019  ? Right shoulder pain 10/17/2019  ? Arthritis of right acromioclavicular joint 08/15/2019  ? Piriformis syndrome of left side 08/15/2019  ? OSA (obstructive sleep apnea) 05/13/2019  ? CKD (chronic kidney disease) stage 3, GFR 30-59 ml/min (HCC) 04/13/2018  ? Closed fracture of distal  lateral malleolus of left ankle 12/28/2017  ? 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 S/P laparoscopic cholecystectomy 07/19/2017  ? Hospital discharge follow-up 07/19/2017  ? Acoustic neuroma ([place]) 06/12/2017  ? Colon cancer screening 03/08/2017  ? Nocturia 12/09/2016  ? Major depressive disorder, 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[place]) 06/05/2016  ? Encounter for preventive health examination 03/07/2015  ? Incisional hernia, without obstruction or gangrene 09/28/2014  ? Obesity 09/28/2014  ? Anxiety state 11/19/2013  ? Gastro-esophageal reflux disease without esophagitis 11/19/2013  ? Squamous cell carcinoma of skin of face 10/21/2011  ? 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 Melanoma ([place]) 11/02/2010  ? Essential hypertension 04/10/2010  ? Osteoporosis 04/10/2010  ? ? ?REFERRING DIAG: Z98.890 (ICD-10-CM) - S/P foot surgery, right ? ?THERAPY DIAG:  ?Pain in right ankle and joints of right foot  - Primary M25.571   ?Difficulty in walking, not elsewhere classified  R26.2   ?Muscle weakness (generalized)  M62.81   ? ? ?PERTINENT HISTORY: S/P R subtalar and talonavicular joint arthrodesis 06/05/2021. Has been having foot pain since May 2022. Has been in a boot or something since then like and ace bandage. Pt push a luggage rack [place] a [place] May 2022 which bothered her foot. Had injections, had x-rays, nothing worked. Had to get surgery. Currently on elequis and metoprolol. Got an apple watch to watch her heart rate. Has Afib once every 24-36 hours. Has not yet had PT since her procedure. Also has an acoustic neuroma L ear which affects her balance, therefore she's using a wheel chair. Pt fell in the bathroom June 06, 2021. Hit her head but her R foot is fine. Head is ok. MD told her that she can weight bear with her CAM boot on Monday August 03, 2021. Start out walking gingerly to see if there is pain, then gradually increase weight for 2-3 days, then midweek should  be fine with full weight bearing. ? ?PRECAUTIONS: atrial fibrillation ? ?SUBJECTIVE:    Has had arhythmia past few days. R foot: has had more pain since she has been out of the boot. Usually about a 4/10. Gets shooting pain. Feels like a bruise on the side of her R foot.  ? ? ? ?PAIN:  ?Are you having pain? Sore, 3/10 currently when walking.   ? ? ? ?Objective ? ? ?[email or web address] ?  ?08/26/2021 ? ?  ?Therapeutic exercise ? ?HR at start of session: 65-68 bpm ? ?Gait with R arch supported ? Decreased R lateral foot pain.  ? ? ?Regular shoe with brace on:  ? ?Standing forward gentle weight shift onto Air Ex pad  ? R LE 10x3 with BE UE assist ? ?Standing bent over glute max extension over table  ? R 5x, then 10x ? ?Sit <> stand with B UE assist, emphasis on femoral control 5x2 ? ?  Seated hip ER  ? R 10x5 seconds for 3 sets ? ?Seated hip adduction ball squeeze 10x5 seconds for 2 sets ? ?Seated trunk flexion 10x10 seconds  ? Decreased hip discomfort ? ? ?Standing B ankle DF/PF on rocker board with B UE assist 2 minutes  ? ? ?Static standing forward weight shift with emphasis on ankle DF to foot flat ? R 10x2 ? ? ? ?Improved exercise technique, movement at target joints, use of target muscles after mod verbal, visual, tactile cues.  ? ? ?  ?Response to treatment ?Pt tolerated session well without aggravation of symptoms.  ?  ? ?Clinical impression ?Decreased R lateral foot pain during gait with R arch supported. Worked on femoral control with sit <> stand to decrease valgus and therefore decrease plantar foot pressure with transfers. Worked on glute strength and ankle mobility to promote better R LE mechanics during gait. Pt tolerated session well without aggravation of symptoms. Pt will benefit from continued skilled physical therapy services to improve strength, balance, and function.  ? ? ? ? ? ?  ? ? ?PATIENT EDUCATION: ?Education details: Cardiac nurse instructions ?Person educated: Patient and Spouse ?Education  method: Explanation ?Education comprehension: verbalized understanding and returned demonstration ? ? ?HOME EXERCISE PROGRAM: ?Access Code: [email or web address] ?URL: [email or web address] ?Date: 03

## 2021-09-16 ENCOUNTER — Ambulatory Visit: Payer: Medicare PPO | Admitting: Internal Medicine

## 2021-09-16 DIAGNOSIS — D485 Neoplasm of uncertain behavior of skin: Secondary | ICD-10-CM | POA: Diagnosis not present

## 2021-09-16 DIAGNOSIS — D2271 Melanocytic nevi of right lower limb, including hip: Secondary | ICD-10-CM | POA: Diagnosis not present

## 2021-09-16 DIAGNOSIS — D2261 Melanocytic nevi of right upper limb, including shoulder: Secondary | ICD-10-CM | POA: Diagnosis not present

## 2021-09-16 DIAGNOSIS — Z85820 Personal history of malignant melanoma of skin: Secondary | ICD-10-CM | POA: Diagnosis not present

## 2021-09-16 DIAGNOSIS — Z85828 Personal history of other malignant neoplasm of skin: Secondary | ICD-10-CM | POA: Diagnosis not present

## 2021-09-16 DIAGNOSIS — D225 Melanocytic nevi of trunk: Secondary | ICD-10-CM | POA: Diagnosis not present

## 2021-09-22 ENCOUNTER — Other Ambulatory Visit: Payer: Self-pay | Admitting: Internal Medicine

## 2021-09-25 ENCOUNTER — Telehealth: Payer: Self-pay | Admitting: Podiatry

## 2021-09-25 NOTE — Telephone Encounter (Signed)
What company did you get this wheelchair from, she is trying to return it to them and doesn't know where it goes? Please call

## 2021-09-28 ENCOUNTER — Ambulatory Visit: Payer: Medicare PPO | Attending: Podiatry

## 2021-09-28 ENCOUNTER — Encounter: Payer: Self-pay | Admitting: Podiatry

## 2021-09-28 DIAGNOSIS — M25571 Pain in right ankle and joints of right foot: Secondary | ICD-10-CM | POA: Insufficient documentation

## 2021-09-28 DIAGNOSIS — R262 Difficulty in walking, not elsewhere classified: Secondary | ICD-10-CM | POA: Insufficient documentation

## 2021-09-28 DIAGNOSIS — M6281 Muscle weakness (generalized): Secondary | ICD-10-CM | POA: Insufficient documentation

## 2021-09-28 NOTE — Therapy (Signed)
? ?OUTPATIENT PHYSICAL THERAPY TREATMENT NOTE ?And ?Progress Report ?(07/29/2021 - 09/28/2021) ? ? ?Patient Name: Kathy Long ?MRN: [id number] ?DOB:04-25-1951, 71 y.o., female ?Today's Date: 09/28/2021 ? ?PCP: [name], MD ?REFERRING PROVIDER: Criselda Peaches, DPM ? ? PT End of Session - 09/28/21 2423   ? ? Visit Number 10   ? Number of Visits 25   ? Date for PT Re-Evaluation 10/22/21   ? Authorization Type 10   ? Authorization Time Period of 10 progress report   ? PT Start Time [phone]   ? PT Stop Time [phone]   ? PT Time Calculation (min) 52 min   ? Equipment Utilized During Treatment --   SPC and RW  ? Activity Tolerance Patient tolerated treatment well   ? Behavior During Therapy [facility] for tasks assessed/performed   ? ?  ?  ? ?  ? ? ? ? ? ? ?Past Medical History:  ?Diagnosis Date  ? 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 Anxiety   ? Arthritis   ? lower back, hands  ? Cancer [facility])   ? melanoma, shoulder  ? Chronic kidney disease   ? Depression   ? GERD (gastroesophageal reflux disease)   ? Hyperlipidemia   ? Hypertension   ? Postoperative bile leak   ? Sleep apnea   ? 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 ?Past Surgical History:  ?Procedure Laterality Date  ? ANKLE FRACTURE SURGERY Left   ? APPENDECTOMY  2010  ? BACK SURGERY    ? disectomy lumbar, scar tissue  ? BREAST CYST EXCISION Right 1970  ? axilla  ? buninectomy Bilateral   ? CATARACT EXTRACTION W/PHACO Left 04/12/2018  ? Procedure: CATARACT EXTRACTION PHACO AND INTRAOCULAR LENS PLACEMENT ([place])  LEFT TORIC LENS;  Surgeon: Leandrew Koyanagi, MD;  Location: [place];  Service: Ophthalmology;  Laterality: Left;  ? CATARACT EXTRACTION W/PHACO Right 05/10/2018  ? Procedure: CATARACT EXTRACTION PHACO AND INTRAOCULAR LENS PLACEMENT ([place])  RIGHT TORIC LENS;  Surgeon: Leandrew Koyanagi, MD;  Location: [place];  Service: Ophthalmology;  Laterality: Right;  DIABETIC  ? CHOLECYSTECTOMY N/A 07/05/2017  ? Procedure: LAPAROSCOPIC CHOLECYSTECTOMY WITH  INTRAOPERATIVE CHOLANGIOGRAM;  Surgeon: Robert Bellow, MD;  Location: [facility]S;  Service: General;  Laterality: N/A;  ? COLONOSCOPY    ? 2009 and color gaurd in 2018  ? COLONOSCOPY N/A 03/25/2020  ? Procedure: COLONOSCOPY;  Surgeon: [name], MD;  [facility]: [facility] ENDOSCOPY;  Service: Endoscopy;  Laterality: N/A;  ? DRUG INDUCED ENDOSCOPY N/A 05/06/2021  ? Procedure: DRUG INDUCED SLEEP ENDOSCOPY;  Surgeon: Melida Quitter, MD;  Location: [place];  Service: ENT;  Laterality: N/A;  ? ERCP N/A 07/12/2017  ? Procedure: ENDOSCOPIC RETROGRADE CHOLANGIOPANCREATOGRAPHY (ERCP);  Surgeon: Lucilla Lame, MD;  Location: [facility] ENDOSCOPY;  Service: Endoscopy;  Laterality: N/A;  ? ERCP N/A 10/04/2017  ? Procedure: ENDOSCOPIC RETROGRADE CHOLANGIOPANCREATOGRAPHY (ERCP);  Surgeon: Lucilla Lame, MD;  Location: [facility] ENDOSCOPY;  Service: Endoscopy;  Laterality: N/A;  ? FOOT SURGERY    ? x 2  ? INCISIONAL HERNIA REPAIR N/A 12/04/2020  ? Procedure: VENTRAL INCISIONAL HERNIA REPAIR WITH MESH;  Surgeon: Armandina Gemma, MD;  Location: [facility];  Service: General;  Laterality: N/A;  ? MELANOMA EXCISION Left 2000  ? PALATOPLASTY N/A 04/08/2015  ? Procedure: PALATOPLASTY;  Surgeon: Beverly Gust, MD;  Location: [facility] ORS;  Service: ENT;  Laterality: N/A;  ? TONSILLECTOMY N/A 04/08/2015  ? Procedure: TONSILLECTOMY;  Surgeon: Beverly Gust, MD;  Location: [facility] ORS;  Service: ENT;  Laterality: N/A;  ? TUBAL LIGATION    ? ?Patient Active Problem List  ? Diagnosis Date Noted  ? Aortic atherosclerosis ([place]) 05/12/2021  ? Acute COVID-19 05/12/2021  ? Positive colorectal cancer screening using Cologuard test 02/05/2020  ? Esophageal dysphagia 12/11/2019  ? Hyperlipidemia LDL goal <100 12/10/2019  ? Right shoulder pain 10/17/2019  ? Arthritis of right acromioclavicular joint 08/15/2019  ? Piriformis syndrome of left side 08/15/2019  ? OSA (obstructive sleep apnea) 05/13/2019  ? CKD (chronic kidney disease) stage 3, GFR 30-59  ml/min (HCC) 04/13/2018  ? Closed fracture of distal lateral malleolus of left ankle 12/28/2017  ? 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 S/P laparoscopic cholecystectomy 07/19/2017  ? Hospital discharge follow-up 07/19/2017  ? Acoustic neuroma ([place]) 06/12/2017  ? Colon cancer screening 03/08/2017  ? Nocturia 12/09/2016  ? Major depressive disorder, 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[place]) 06/05/2016  ? Encounter for preventive health examination 03/07/2015  ? Incisional hernia, without obstruction or gangrene 09/28/2014  ? Obesity 09/28/2014  ? Anxiety state 11/19/2013  ? Gastro-esophageal reflux disease without esophagitis 11/19/2013  ? Squamous cell carcinoma of skin of face 10/21/2011  ? 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 Melanoma ([place]) 11/02/2010  ? Essential hypertension 04/10/2010  ? Osteoporosis 04/10/2010  ? ? ?REFERRING DIAG: Z98.890 (ICD-10-CM) - S/P foot surgery, right ? ?THERAPY DIAG:  ?Pain in right ankle and joints of right foot  - Primary M25.571   ?Difficulty in walking, not elsewhere classified  R26.2   ?Muscle weakness (generalized)  M62.81   ? ? ?PERTINENT HISTORY: S/P R subtalar and talonavicular joint arthrodesis 06/05/2021. Has been having foot pain since May 2022. Has been in a boot or something since then like and ace bandage. Pt [place] luggage [place] which bothered her foot. Had injections, had x-rays, nothing worked. Had to get surgery. Currently on elequis and metoprolol. Got an apple watch to watch her heart rate. Has Afib once every 24-36 hours. Has not yet had PT since her procedure. Also has an acoustic neuroma L ear which affects her balance, therefore she's using a wheel chair. Pt fell in the bathroom June 06, 2021. Hit her head but her R foot is fine. Head is ok. MD told her that she can weight bear with her CAM boot on Monday August 03, 2021. Start out walking gingerly to see if there is pain, then  gradually increase weight for 2-3 days, then midweek should be fine with full weight bearing. ? ?PRECAUTIONS: atrial fibrillation ? ?SUBJECTIVE:    R foot is doing good. Got some arch supports help. No R foot pain currently. 3/10 at most for the past 7 days (shooting pain), which lasts for 30 seconds, then its gone.  ? ? ? ? ?PAIN:  ?Are you having pain? No ? ? ? ?Objective ? ? ?[email or web address] ?  ?08/26/2021 ? ?  ?Therapeutic exercise ? ?Supine R ankle DF and PF 1x each  ? ?HR at start of session: 67-69 bpm ? ? ?Seated manually resisted R hip flexion, knee flexion, knee extension, S/L hip abduction, prone hip extension 1x each way ? ?Gait x 500 ft.  ? ?Reviewed progress/current status with PT towards goals.  ? ?Standing gastroc stretch at wall 30 seconds x 3 R LE ? ? ?Standing B Heel raises 5x ? Uncomfortable.  ? ?Seated R  ankle PF green band 10x, then 10x5 seconds holds for 2 sets ? ?Side stepping 32 ft to the R and 32 ft to the L for 2 sets to promote glute med strengthening  ? Slight sagittal plane unsteadiness at ankles at times ? ? ? ? ?Improved exercise technique, movement at target joints, use of target muscles after mod verbal, visual, tactile cues.  ? ? ?  ?Response to treatment ?Pt tolerated session well without aggravation of symptoms.  ?  ? ?Clinical impression ?Pt currently 16 weeks post op. Pt demonstrates improved R ankle pain, R LE strength, R ankle DF/PF AROM and ability to ambulate since initial evaluation. Pt making very good progress with PT towards goals. Pt still demonstrates limited R ankle AROM, and heel discomfort and some unsteadiness during gait, and will benefit from continued skilled physical therapy services to improve the aforementioned deficits. Pt tolerated session well without aggravation of symptoms.  ? ? ? ? ? ? ? ? ?  ? ? ?PATIENT EDUCATION: ?Education details: Cardiac nurse instructions ?Person educated: Patient and Spouse ?Education method: Explanation ?Education  comprehension: verbalized understanding and returned demonstration ? ? ?HOME EXERCISE PROGRAM: ?Access Code: [email or web address] ?URL: [email or web address] ?Date: 08/18/2021 ?Prepared by: Joneen Boers ? ?Exercises ?S

## 2021-09-30 ENCOUNTER — Ambulatory Visit: Payer: Medicare PPO

## 2021-09-30 DIAGNOSIS — M25571 Pain in right ankle and joints of right foot: Secondary | ICD-10-CM | POA: Diagnosis not present

## 2021-09-30 DIAGNOSIS — M6281 Muscle weakness (generalized): Secondary | ICD-10-CM

## 2021-09-30 DIAGNOSIS — R262 Difficulty in walking, not elsewhere classified: Secondary | ICD-10-CM | POA: Diagnosis not present

## 2021-09-30 NOTE — Therapy (Signed)
? ?OUTPATIENT PHYSICAL THERAPY TREATMENT NOTE ? ? ? ? ?Patient Name: Kathy Long ?MRN: [id number] ?DOB:06-29-1950, 71 y.o., female ?Today's Date: 09/30/2021 ? ?PCP: [name], MD ?REFERRING PROVIDER: Criselda Peaches, DPM ? ? PT End of Session - 09/30/21 1415   ? ? Visit Number 11   ? Number of Visits 25   ? Date for PT Re-Evaluation 10/22/21   ? Authorization Type 1   ? Authorization Time Period of 10 progress report   ? PT Start Time 1415   ? PT Stop Time 9528   ? PT Time Calculation (min) 42 min   ? Equipment Utilized During Treatment --   SPC and RW  ? Activity Tolerance Patient tolerated treatment well   ? Behavior During Therapy [facility] for tasks assessed/performed   ? ?  ?  ? ?  ? ? ? ? ? ? ? ?Past Medical History:  ?Diagnosis Date  ? 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 Anxiety   ? Arthritis   ? lower back, hands  ? Cancer [facility])   ? melanoma, shoulder  ? Chronic kidney disease   ? Depression   ? GERD (gastroesophageal reflux disease)   ? Hyperlipidemia   ? Hypertension   ? Postoperative bile leak   ? Sleep apnea   ? 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 ?Past Surgical History:  ?Procedure Laterality Date  ? ANKLE FRACTURE SURGERY Left   ? APPENDECTOMY  2010  ? BACK SURGERY    ? disectomy lumbar, scar tissue  ? BREAST CYST EXCISION Right 1970  ? axilla  ? buninectomy Bilateral   ? CATARACT EXTRACTION W/PHACO Left 04/12/2018  ? Procedure: CATARACT EXTRACTION PHACO AND INTRAOCULAR LENS PLACEMENT ([place])  LEFT TORIC LENS;  Surgeon: Leandrew Koyanagi, MD;  Location: [place];  Service: Ophthalmology;  Laterality: Left;  ? CATARACT EXTRACTION W/PHACO Right 05/10/2018  ? Procedure: CATARACT EXTRACTION PHACO AND INTRAOCULAR LENS PLACEMENT ([place])  RIGHT TORIC LENS;  Surgeon: Leandrew Koyanagi, MD;  Location: [place];  Service: Ophthalmology;  Laterality: Right;  DIABETIC  ? CHOLECYSTECTOMY N/A 07/05/2017  ? Procedure: LAPAROSCOPIC CHOLECYSTECTOMY WITH INTRAOPERATIVE CHOLANGIOGRAM;  Surgeon:  Robert Bellow, MD;  Location: [facility] ORS;  Service: General;  Laterality: N/A;  ? COLONOSCOPY    ? 2009 and color gaurd in 2018  ? COLONOSCOPY N/A 03/25/2020  ? Procedure: COLONOSCOPY;  Surgeon: [name], MD;  Location: [facility] ENDOSCOPY;  Service: Endoscopy;  Laterality: N/A;  ? DRUG INDUCED ENDOSCOPY N/A 05/06/2021  ? Procedure: DRUG INDUCED SLEEP ENDOSCOPY;  Surgeon: Melida Quitter, MD;  Location: [place];  Service: ENT;  Laterality: N/A;  ? ERCP N/A 07/12/2017  ? Procedure: ENDOSCOPIC RETROGRADE CHOLANGIOPANCREATOGRAPHY (ERCP);  Surgeon: Lucilla Lame, MD;  Location: [facility] ENDOSCOPY;  Service: Endoscopy;  Laterality: N/A;  ? ERCP N/A 10/04/2017  ? Procedure: ENDOSCOPIC RETROGRADE CHOLANGIOPANCREATOGRAPHY (ERCP);  Surgeon: Lucilla Lame, MD;  Location: [facility] ENDOSCOPY;  Service: Endoscopy;  Laterality: N/A;  ? FOOT SURGERY    ? x 2  ? INCISIONAL HERNIA REPAIR N/A 12/04/2020  ? Procedure: VENTRAL INCISIONAL HERNIA REPAIR WITH MESH;  Surgeon: Armandina Gemma, MD;  Location: [facility];  Service: General;  Laterality: N/A;  ? MELANOMA EXCISION Left 2000  ? PALATOPLASTY N/A 04/08/2015  ? Procedure: PALATOPLASTY;  Surgeon: Beverly Gust, MD;  Location: [facility];  Service: ENT;  Laterality: N/A;  ? TONSILLECTOMY N/A 04/08/2015  ? Procedure: TONSILLECTOMY;  Surgeon: Beverly Gust,  MD;  Location: [facility]S;  Service: ENT;  Laterality: N/A;  ? TUBAL LIGATION    ? ?Patient Active Problem List  ? Diagnosis Date Noted  ? Aortic atherosclerosis ([place]) 05/12/2021  ? Acute COVID-19 05/12/2021  ? Positive colorectal cancer screening using Cologuard test 02/05/2020  ? Esophageal dysphagia 12/11/2019  ? Hyperlipidemia LDL goal <100 12/10/2019  ? Right shoulder pain 10/17/2019  ? Arthritis of right acromioclavicular joint 08/15/2019  ? Piriformis syndrome of left side 08/15/2019  ? OSA (obstructive sleep apnea) 05/13/2019  ? CKD (chronic kidney disease) stage 3, GFR 30-59 ml/min (HCC) 04/13/2018  ? Closed fracture  of distal lateral malleolus of left ankle 12/28/2017  ? 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 S/P laparoscopic cholecystectomy 07/19/2017  ? Hospital discharge follow-up 07/19/2017  ? Acoustic neuroma ([place]) 06/12/2017  ? Colon cancer screening 03/08/2017  ? Nocturia 12/09/2016  ? Major depressive disorder, 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[place]) 06/05/2016  ? Encounter for preventive health examination 03/07/2015  ? Incisional hernia, without obstruction or gangrene 09/28/2014  ? Obesity 09/28/2014  ? Anxiety state 11/19/2013  ? Gastro-esophageal reflux disease without esophagitis 11/19/2013  ? Squamous cell carcinoma of skin of face 10/21/2011  ? 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 Melanoma ([place]) 11/02/2010  ? Essential hypertension 04/10/2010  ? Osteoporosis 04/10/2010  ? ? ?REFERRING DIAG: Z98.890 (ICD-10-CM) - S/P foot surgery, right ? ?THERAPY DIAG:  ?Pain in right ankle and joints of right foot  - Primary M25.571   ?Difficulty in walking, not elsewhere classified  R26.2   ?Muscle weakness (generalized)  M62.81   ? ? ?PERTINENT HISTORY: S/P R subtalar and talonavicular joint arthrodesis 06/05/2021. Has been having foot pain since May 2022. Has been in a boot or something since then like and ace bandage. Pt push [place] luggage rack [place] which bothered her foot. Had injections, had x-rays, nothing worked. Had to get surgery. Currently on elequis and metoprolol. Got an apple watch to watch her heart rate. Has Afib once every 24-36 hours. Has not yet had PT since her procedure. Also has an acoustic neuroma L ear which affects her balance, therefore she's using a wheel chair. Pt fell in the bathroom June 06, 2021. Hit her head but her R foot is fine. Head is ok. MD told her that she can weight bear with her CAM boot on Monday August 03, 2021. Start out walking gingerly to see if there is pain, then gradually increase weight for 2-3 days, then  midweek should be fine with full weight bearing. ? ?PRECAUTIONS: atrial fibrillation ? ?SUBJECTIVE:    R foot is a little sore anterior lateral ankle.  Helped with a funeral at [employer].  Had afib this morning which stopped after about 45 minutes to an hour. Usually happens around 8 am.  ? ? ? ? ?PAIN:  ?Are you having pain? 3/10 anterior lateral ankle soreness.  ? ? ? ?Objective ? ? ?[email or web address] ?  ? ?Manual therapy ?Seated STM R lateral hamstrings to decrease tension  ?  ?Seated STM R lateral ankle  ? ? ? ?Therapeutic exercise ? ? ?HR at start of session: 63-65 bpm ? ?Seated R knee flexion targeting medial hamstrings, yellow band 10x, then red 10x2 ? ?Standing gastroc stretch at wall 30 seconds x 4 R LE ? ?Seated clamshell resisted, hips less than 90 degrees flexion  ? Red band 10x, then 8x ? ?Seated  hip adduction folded pillow squeeze 10x5 seconds for 2 sets ? ?Seated R ankle IV AROM 10x2 with 5 seconds to decrease lateral ankle pressure ? ? ?Seated R ankle PF green band 10x5 seconds holds for 2 sets ? ? ? ?Improved exercise technique, movement at target joints, use of target muscles after mod verbal, visual, tactile cues.  ? ? ?  ?Response to treatment ?Pt tolerated session well without aggravation of symptoms. 1/10 R ankle soreness after session.  ?  ? ?Clinical impression ?Worked on glute and ankle PF strengthening, as well as ankle IV at available range to decrease anterior lateral ankle pressure. Decreased R ankle soreness to 1/10 after session. Pt will benefit from continued skilled physical therapy services to decrease pain, improve strength and function.  ? ? ? ? ? ? ? ? ?  ? ? ?PATIENT EDUCATION: ?Education details: Cardiac nurse instructions ?Person educated: Patient and Spouse ?Education method: Explanation ?Education comprehension: verbalized understanding and returned demonstration ? ? ?HOME EXERCISE PROGRAM: ?Access Code: [email or web address] ?URL: [email or web address] ?Date:  08/18/2021 ?Prepared by: Joneen Boers ? ?Exercises ?Supine Ankle Pumps - 5 x daily - 7 x weekly - 3 sets - 10 reps ?Supine Active Straight Leg Raise - 1 x daily - 7 x weekly - 3 sets - 10 reps ?Sidelying Hi

## 2021-10-02 ENCOUNTER — Other Ambulatory Visit: Payer: Self-pay | Admitting: Family

## 2021-10-02 NOTE — Progress Notes (Signed)
Cardiology Office Note ? ?Date:  10/05/2021  ? ?ID:  Kathy Long, DOB [birth year], MRN [id number] ? ?PCP:  [name], MD  ? ?Chief Complaint  ?Patient presents with  ? 3 month follow up   ?  Patient c/o A-fib spells at times. Medications reviewed by the patient verbally.   ? ? ?HPI:  ?Kathy Long is a 71 y.o. female with a hx of  ?hypertension,  ?Hyperlipidemia ?who presents due to paroxysmal atrial fibrillation. ? ?Last seen by myself in clinic February 2023 ? ?Reports she continues to have mildly symptomatic paroxysmal atrial fibrillation, ?Recorded on watch , burden 7 to 19% ?On metoprolol succinate 50 daily ?On Eliquis 5 twice daily ?Has taken flecainide sparingly ? ?Lost 30 pounds which she thinks is helping her sleep apnea ? ?Active at baseline ?EKG personally reviewed by myself on todays visit ?Normal sinus rhythm rate 66 bpm no significant ST-T wave changes ? ?Prior records reviewed ?foot surgery for hammertoe correction 06/05/2021  ?went into atrial fibrillation.   ?CHA2DS2-VASc score 3(age, htn, gender). ?Seen by cardiology ?Xarelto was increased up to 20 June 08, 2021 ? ?Echocardiogram ?ejection fraction 60 to 44%, grade 1 diastolic dysfunction, normal right heart pressures ? ?Zio reviewed ?Paroxysmal atrial fibrillation noted on cardiac monitor.  26% burden, ? ?Patient had a min HR of 42 bpm, max HR of 176 bpm, and avg HR of 70 bpm. Predominant underlying rhythm was Sinus Rhythm.  ? ?1 run of Ventricular Tachycardia occurred lasting 18 beats with a max rate of 162 bpm (avg 152 bpm).  ? ?21 Supraventricular Tachycardia runs occurred, the run with the fastest interval lasting 9 beats with a max rate of 139 bpm, the longest lasting 14 beats with an avg rate of 109 bpm.  ? ?Atrial Fibrillation occurred (26% burden), ranging from 57-176 bpm (avg of 89 bpm), the longest lasting 10 hours 21 mins with an avg rate of 84 bpm. ? ?PMH:   has a past medical history of Acoustic neuroma ([place]), Anxiety,  Arthritis, Cancer ([place]), Chronic kidney disease, Depression, GERD (gastroesophageal reflux disease), Hyperlipidemia, Hypertension, Postoperative bile leak, Sleep apnea, and 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. ? ?PSH:    ?Past Surgical History:  ?Procedure Laterality Date  ? ANKLE FRACTURE SURGERY Left   ? APPENDECTOMY  2010  ? BACK SURGERY    ? disectomy lumbar, scar tissue  ? BREAST CYST EXCISION Right 1970  ? axilla  ? buninectomy Bilateral   ? CATARACT EXTRACTION W/PHACO Left 04/12/2018  ? Procedure: CATARACT EXTRACTION PHACO AND INTRAOCULAR LENS PLACEMENT ([place])  LEFT TORIC LENS;  Surgeon: Leandrew Koyanagi, MD;  Location: [place];  Service: Ophthalmology;  Laterality: Left;  ? CATARACT EXTRACTION W/PHACO Right 05/10/2018  ? Procedure: CATARACT EXTRACTION PHACO AND INTRAOCULAR LENS PLACEMENT ([place])  RIGHT TORIC LENS;  Surgeon: Leandrew Koyanagi, MD;  Location: [place];  Service: Ophthalmology;  Laterality: Right;  DIABETIC  ? CHOLECYSTECTOMY N/A 07/05/2017  ? Procedure: LAPAROSCOPIC CHOLECYSTECTOMY WITH INTRAOPERATIVE CHOLANGIOGRAM;  Surgeon: Robert Bellow, MD;  Location: [facility];  Service: General;  Laterality: N/A;  ? COLONOSCOPY    ? 2009 and color gaurd in 2018  ? COLONOSCOPY N/A 03/25/2020  ? Procedure: COLONOSCOPY;  Surgeon: [name], MD;  Location: [facility] END[facility]COPY;  Service: Endoscopy;  Laterality: N/A;  ? DRUG INDUCED ENDOSCOPY N/A 05/06/2021  ? Procedure: DRUG INDUCED SLEEP ENDOSCOPY;  Surgeon: Melida Quitter, MD;  Location: [place];  Service: ENT;  Laterality:  N/A;  ? ERCP N/A 07/12/2017  ? Procedure: ENDOSCOPIC RETROGRADE CHOLANGIOPANCREATOGRAPHY (ERCP);  Surgeon: Lucilla Lame, MD;  Location: [facility] ENDOSCOPY;  Service: Endoscopy;  Laterality: N/A;  ? ERCP N/A 10/04/2017  ? Procedure: ENDOSCOPIC RETROGRADE CHOLANGIOPANCREATOGRAPHY (ERCP);  Surgeon: Lucilla Lame, MD;  Location: [facility] ENDOSCOPY;  Service: Endoscopy;  Laterality: N/A;  ? FOOT SURGERY     ? x 2  ? INCISIONAL HERNIA REPAIR N/A 12/04/2020  ? Procedure: VENTRAL INCISIONAL HERNIA REPAIR WITH MESH;  Surgeon: Armandina Gemma, MD;  [facility];  Service: General;  Laterality: N/A;  ? MELANOMA EXCISION Left 2000  ? PALATOPLASTY N/A 04/08/2015  ? Procedure: PALATOPLASTY;  Surgeon: Beverly Gust, MD;  Location: [facility];  Service: ENT;  Laterality: N/A;  ? TONSILLECTOMY N/A 04/08/2015  ? Procedure: TONSILLECTOMY;  Surgeon: Beverly Gust, MD;  Location: [facility];  Service: ENT;  Laterality: N/A;  ? TUBAL LIGATION    ? ? ?Current Outpatient Medications  ?Medication Sig Dispense Refill  ? apixaban (ELIQUIS) 5 MG TABS tablet Take 1 tablet (5 mg total) by mouth 2 (two) times daily. 180 tablet 3  ? Cholecalciferol (VITAMIN D3) 75 MCG (3000 UT) TABS Take 6,000 Units by mouth daily.    ? citalopram (CELEXA) 20 MG tablet TAKE 1 TABLET BY MOUTH DAILY 90 tablet 1  ? clobetasol (TEMOVATE) 0.05 % external solution APPLY DAILY UNTIL RASH IS CLEAR 50 mL 0  ? Coenzyme Q10 100 MG capsule Take 100 mg by mouth daily.    ? flecainide (TAMBOCOR) 50 MG tablet Take 1 tablet (50 mg total) by mouth 2 (two) times daily as needed. For A-fib 180 tablet 3  ? melatonin 5 MG TABS Take 5 mg by mouth at bedtime.    ? metoprolol succinate (TOPROL-XL) 50 MG 24 hr tablet Take 1 tablet (50 mg total) by mouth daily. 90 tablet 3  ? MOUNJARO 5 MG/0.5ML Pen INJECT '5MG'$  SUBCUTANEOUSLY ONCE A WEEK 6 mL 0  ? omeprazole (PRILOSEC) 40 MG capsule TAKE 1 CAPSULE BY MOUTH ONCE DAILY 90 capsule 1  ? ondansetron (ZOFRAN) 4 MG tablet Take 1 tablet (4 mg total) by mouth every 8 (eight) hours as needed for nausea or vomiting. 20 tablet 0  ? rosuvastatin (CRESTOR) 10 MG tablet TAKE ONE TABLET BY MOUTH EVERY DAY 90 tablet 1  ? gabapentin (NEURONTIN) 300 MG capsule TAKE 1 CAPSULE BY MOUTH 3 TIMES DAILY FOR SEVEN DAYS (Patient not taking: Reported on 07/06/2021) 21 capsule 0  ? guaiFENesin-codeine (CHERATUSSIN AC) 100-10 MG/5ML syrup Take 5 mLs by mouth 3 (three)  times daily as needed for cough. (Patient not taking: Reported on 07/06/2021) 120 mL 0  ? oxyCODONE (OXY IR/ROXICODONE) 5 MG immediate release tablet TAKE ONE TABLET BY MOUTH EVERY 4 HOURS AS NEEDED FOR SEVERE PAIN FOR UP TO 7 DAYS (Patient not taking: Reported on 07/06/2021) 28 tablet 0  ? vitamin C (ASCORBIC ACID) 500 MG tablet Take 500 mg by mouth daily. (Patient not taking: Reported on 10/05/2021)    ? ?No current facility-administered medications for this visit.  ? ? ?Allergies:   Morphine and related, Tramadol, and Tape  ? ?Social History:  The patient  reports that she has never smoked. She has never used smokeless tobacco. She reports current alcohol use. She reports that she does not use drugs.  ? ?Family History:   family history includes Cancer (age of onset: 39) in her sister.  ? ? ?Review of Systems: ?Review of Systems  ?Constitutional: Negative.   ?  HENT: Negative.    ?Respiratory: Negative.    ?Cardiovascular: Negative.   ?Gastrointestinal: Negative.   ?Musculoskeletal: Negative.   ?Neurological: Negative.   ?Psychiatric/Behavioral: Negative.    ?All other systems reviewed and are negative. ? ?PHYSICAL EXAM: ?VS:  BP 110/64 (BP Location: Left Arm, Patient Position: Sitting, Cuff Size: Normal)   Pulse 66   Ht '5\' 2"'$  (1.575 m)   Wt 152 lb 6 oz (69.1 kg)   SpO2 99%   BMI 27.87 kg/m?  , BMI Body mass index is 27.87 kg/m?Marland Kitchen ?Constitutional:  oriented to person, place, and time. No distress.  ?HENT:  ?Head: Grossly normal ?Eyes:  no discharge. No scleral icterus.  ?Neck: No JVD, no carotid bruits  ?Cardiovascular: Regular rate and rhythm, no murmurs appreciated ?Pulmonary/Chest: Clear to auscultation bilaterally, no wheezes or rails ?Abdominal: Soft.  no distension.  no tenderness.  ?Musculoskeletal: Normal range of motion ?Neurological:  normal muscle tone. Coordination normal. No atrophy ?Skin: Skin warm and dry ?Psychiatric: normal affect, pleasant ? ?Recent Labs: ?03/10/2021: ALT 24; Hemoglobin 13.6;  Platelets 196; TSH 1.17 ?04/30/2021: BUN 26; Creatinine, Ser 1.30; Potassium 4.1; Sodium 143  ? ? ?Lipid Panel ?Lab Results  ?Component Value Date  ? CHOL 167 03/10/2021  ? HDL 52 03/10/2021  ? [place] 99 03/10/2021  ? TRI

## 2021-10-05 ENCOUNTER — Encounter: Payer: Self-pay | Admitting: Cardiovascular Disease

## 2021-10-05 ENCOUNTER — Ambulatory Visit: Payer: Medicare PPO | Admitting: Cardiovascular Disease

## 2021-10-05 VITALS — BP 110/64 | HR 66 | Ht 62.0 in | Wt 152.4 lb

## 2021-10-05 DIAGNOSIS — E7800 Pure hypercholesterolemia, unspecified: Secondary | ICD-10-CM

## 2021-10-05 DIAGNOSIS — Z79899 Other long term (current) drug therapy: Secondary | ICD-10-CM | POA: Diagnosis not present

## 2021-10-05 DIAGNOSIS — Z5181 Encounter for therapeutic drug level monitoring: Secondary | ICD-10-CM | POA: Diagnosis not present

## 2021-10-05 DIAGNOSIS — I10 Essential (primary) hypertension: Secondary | ICD-10-CM | POA: Diagnosis not present

## 2021-10-05 DIAGNOSIS — I700 Atherosclerosis of aorta: Secondary | ICD-10-CM

## 2021-10-05 DIAGNOSIS — I480 Paroxysmal atrial fibrillation: Secondary | ICD-10-CM | POA: Diagnosis not present

## 2021-10-05 MED ORDER — FLECAINIDE ACETATE 50 MG PO TABS
50.0000 mg | ORAL_TABLET | Freq: Two times a day (BID) | ORAL | 3 refills | Status: DC
Start: 2021-10-05 — End: 2021-11-09

## 2021-10-05 NOTE — Patient Instructions (Addendum)
Medication Instructions:  ?Please start flecainide 50 mg twice a day ? ? ?If you need a refill on your cardiac medications before your next appointment, please call your pharmacy.  ? ?Lab work: ?No new labs needed ? ?Testing/Procedures: ?Cardiopulmonary Exercise Stress Test  ? ?A cardiopulmonary stress test is an exercise test for your heart and lungs. ? ?Instructions regarding medication:  ? ?Do NOT hold metoprolol or flecainide. Take all your medications as scheduled. ? ? ?How to prepare for your Stress test: ? ?Do not eat or drink anything 4 hours before the test or as told by your doctor ?No caffeine for 24 hours prior to test ?No smoking 24 hours prior to test. ?Ladies, please do not wear dresses.  Skirts or pants are appropriate. Please wear a short sleeve shirt and sports bra if possible. ?No perfume, cologne or lotion. ?Wear comfortable walking shoes. No heels!  ? ?Follow-Up: ?At [facility], you and your health needs are our priority.  As part of our continuing mission to provide you with exceptional heart care, we have created designated Provider Care Teams.  These Care Teams include your primary Cardiologist (physician) and Advanced Practice Providers (APPs -  Physician Assistants and Nurse Practitioners) who all work together to provide you with the care you need, when you need it. ? ?You will need a follow up appointment in 6 months ? ?Providers on your designated Care Team:   ?Murray Hodgkins, NP ?Christell Faith, PA-C ?Cadence Kathlen Mody, PA-C ? ?COVID-19 Vaccine Information can be found at: [email or web address] For questions related to vaccine distribution or appointments, please email [email or web address] or call [phone].  ? ?

## 2021-10-06 ENCOUNTER — Ambulatory Visit: Payer: Medicare PPO

## 2021-10-06 DIAGNOSIS — R262 Difficulty in walking, not elsewhere classified: Secondary | ICD-10-CM | POA: Diagnosis not present

## 2021-10-06 DIAGNOSIS — M6281 Muscle weakness (generalized): Secondary | ICD-10-CM

## 2021-10-06 DIAGNOSIS — M25571 Pain in right ankle and joints of right foot: Secondary | ICD-10-CM | POA: Diagnosis not present

## 2021-10-06 NOTE — Therapy (Signed)
? ?OUTPATIENT PHYSICAL THERAPY TREATMENT NOTE ? ? ? ? ?Patient Name: Kathy Long ?MRN: [id number] ?DOB:[birth year], 71 y.o., female ?Today's Date: 10/06/2021 ? ?PCP: [name], MD ?REFERRING PROVIDER: Criselda Peaches, DPM ? ? PT End of Session - 10/06/21 1331   ? ? Visit Number 12   ? Number of Visits 25   ? Date for PT Re-Evaluation 10/22/21   ? Authorization Type 2   ? Authorization Time Period of 10 progress report   ? PT Start Time 1331   ? PT Stop Time 1420   ? PT Time Calculation (min) 49 min   ? Equipment Utilized During Treatment --   SPC and RW  ? Activity Tolerance Patient tolerated treatment well   ? Behavior During Therapy [facility] for tasks assessed/performed   ? ?  ?  ? ?  ? ? ? ? ? ? ? ? ?Past Medical History:  ?Diagnosis Date  ? 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 Anxiety   ? Arthritis   ? lower back, hands  ? Cancer [facility])   ? melanoma, shoulder  ? Chronic kidney disease   ? Depression   ? GERD (gastroesophageal reflux disease)   ? Hyperlipidemia   ? Hypertension   ? Postoperative bile leak   ? Sleep apnea   ? 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 ?Past Surgical History:  ?Procedure Laterality Date  ? ANKLE FRACTURE SURGERY Left   ? APPENDECTOMY  2010  ? BACK SURGERY    ? disectomy lumbar, scar tissue  ? BREAST CYST EXCISION Right 1970  ? axilla  ? buninectomy Bilateral   ? CATARACT EXTRACTION W/PHACO Left 04/12/2018  ? Procedure: CATARACT EXTRACTION PHACO AND INTRAOCULAR LENS PLACEMENT ([place])  LEFT TORIC LENS;  Surgeon: Leandrew Koyanagi, MD;  Location: [place];  Service: Ophthalmology;  Laterality: Left;  ? CATARACT EXTRACTION W/PHACO Right 05/10/2018  ? Procedure: CATARACT EXTRACTION PHACO AND INTRAOCULAR LENS PLACEMENT ([place])  RIGHT TORIC LENS;  Surgeon: Leandrew Koyanagi, MD;  Location: [place];  Service: Ophthalmology;  Laterality: Right;  DIABETIC  ? CHOLECYSTECTOMY N/A 07/05/2017  ? Procedure: LAPAROSCOPIC CHOLECYSTECTOMY WITH INTRAOPERATIVE CHOLANGIOGRAM;   Surgeon: Robert Bellow, MD;  Location: [facility];  Service: General;  Laterality: N/A;  ? COLONOSCOPY    ? 2009 and color gaurd in 2018  ? COLONOSCOPY N/A 03/25/2020  ? Procedure: COLONOSCOPY;  Surgeon: [name], MD;  Location[facility]COPY;  Service: Endoscopy;  Laterality: N/A;  ? DRUG INDUCED ENDOSCOPY N/A 05/06/2021  ? Procedure: DRUG INDUCED SLEEP ENDOSCOPY;  Surgeon: Melida Quitter, MD;  Location: [place];  Service: ENT;  Laterality: N/A;  ? ERCP N/A 07/12/2017  ? Procedure: ENDOSCOPIC RETROGRADE CHOLANGIOPANCREATOGRAPHY (ERCP);  Surgeon: Lucilla Lame, MD;  Location: [facility] ENDOSCOPY;  Service: Endoscopy;  Laterality: N/A;  ? ERCP N/A 10/04/2017  ? Procedure: ENDOSCOPIC RETROGRADE CHOLANGIOPANCREATOGRAPHY (ERCP);  Surgeon: Lucilla Lame, MD;  Location: [facility] ENDOSCOPY;  Service: Endoscopy;  Laterality: N/A;  ? FOOT SURGERY    ? x 2  ? INCISIONAL HERNIA REPAIR N/A 12/04/2020  ? Procedure: VENTRAL INCISIONAL HERNIA REPAIR WITH MESH;  Surgeon: Armandina Gemma, MD;  Location: [facility];  Service: General;  Laterality: N/A;  ? MELANOMA EXCISION Left 2000  ? PALATOPLASTY N/A 04/08/2015  ? Procedure: PALATOPLASTY;  Surgeon: Beverly Gust, MD;  Location: [facility];  Service: ENT;  Laterality: N/A;  ? TONSILLECTOMY N/A 04/08/2015  ? Procedure: TONSILLECTOMY;  Surgeon: Freda Munro  Tami Ribas, MD;  Location: [facility];  Service: ENT;  Laterality: N/A;  ? TUBAL LIGATION    ? ?Patient Active Problem List  ? Diagnosis Date Noted  ? Aortic atherosclerosis ([place]) 05/12/2021  ? Acute COVID-19 05/12/2021  ? Positive colorectal cancer screening using Cologuard test 02/05/2020  ? Esophageal dysphagia 12/11/2019  ? Hyperlipidemia LDL goal <100 12/10/2019  ? Right shoulder pain 10/17/2019  ? Arthritis of right acromioclavicular joint 08/15/2019  ? Piriformis syndrome of left side 08/15/2019  ? OSA (obstructive sleep apnea) 05/13/2019  ? CKD (chronic kidney disease) stage 3, GFR 30-59 ml/min (HCC) 04/13/2018  ? Closed  fracture of distal lateral malleolus of left ankle 12/28/2017  ? 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 S/P laparoscopic cholecystectomy 07/19/2017  ? Hospital discharge follow-up 07/19/2017  ? Acoustic neuroma ([place]) 06/12/2017  ? Colon cancer screening 03/08/2017  ? Nocturia 12/09/2016  ? Major depressive disorder, 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[place]) 06/05/2016  ? Encounter for preventive health examination 03/07/2015  ? Incisional hernia, without obstruction or gangrene 09/28/2014  ? Obesity 09/28/2014  ? Anxiety state 11/19/2013  ? Gastro-esophageal reflux disease without esophagitis 11/19/2013  ? Squamous cell carcinoma of skin of face 10/21/2011  ? 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 Melanoma ([place]) 11/02/2010  ? Essential hypertension 04/10/2010  ? Osteoporosis 04/10/2010  ? ? ?REFERRING DIAG: Z98.890 (ICD-10-CM) - S/P foot surgery, right ? ?THERAPY DIAG:  ?Pain in right ankle and joints of right foot  - Primary M25.571   ?Difficulty in walking, not elsewhere classified  R26.2   ?Muscle weakness (generalized)  M62.81   ? ? ?PERTINENT HISTORY: S/P R subtalar and talonavicular joint arthrodesis 06/05/2021. Has been having foot pain since May 2022. Has been in a boot or something since then like and ace bandage. Pt push a luggage rack [place] 2022 which bothered her foot. Had injections, had x-rays, nothing worked. Had to get surgery. Currently on elequis and metoprolol. Got an apple watch to watch her heart rate. Has Afib once every 24-36 hours. Has not yet had PT since her procedure. Also has an acoustic neuroma L ear which affects her balance, therefore she's using a wheel chair. Pt fell in the bathroom June 06, 2021. Hit her head but her R foot is fine. Head is ok. MD told her that she can weight bear with her CAM boot on Monday August 03, 2021. Start out walking gingerly to see if there is pain, then gradually increase weight for 2-3 days,  then midweek should be fine with full weight bearing. ? ?PRECAUTIONS: atrial fibrillation ? ?SUBJECTIVE:    Was given flecainide PRN for her afib by her cardiologist. Has not had episodes of Afib since then. R foot and ankle are still discomfort (anterior lateral ankle) ? ? ? ? ?PAIN:  ?Are you having pain? 3/10 anterior lateral ankle soreness.  ? ? ? ?Objective ? ? ?[email or web address] ?  ? ?Manual therapy  ?Seated STM R lateral hamstrings to decrease tension  ? Decreased R ankle symptoms to 1/10 during gait afterwards ?  ? ? ? ?Therapeutic exercise ? ? ?HR at start of session: 59-61 bpm ? ?Seated R knee flexion targeting medial hamstrings, red band 10x3 ? ?Seated clamshell resisted, hips less than 90 degrees flexion  ? Red band 10x3 ? ?Side stepping with red band around knees 32 ft to the R and 32 ft to the L 1x ? Then with  red band around ankles 32 ft to the R and 32 ft to the L 1x. Good glute med  muscle use felt.  ? ?Seated R ankle IV AROM 10x2 with 5 seconds to decrease lateral ankle pressure ? ? ?Seated R ankle PF blue band 10x5 seconds holds for 2 sets ? ?Seated hip ER  ? R 10x5 seconds for 3 sets ? ?Seated hip adduction folded pillow squeeze 10x5 seconds for 2 sets ? ?Standing gastroc stretch at wall 30 seconds x 4 R LE ? ? ?Improved exercise technique, movement at target joints, use of target muscles after mod verbal, visual, tactile cues.  ? ? ?  ?Response to treatment ?Pt tolerated session well without aggravation of symptoms. Decreased R ankle symptoms to 1/10 after session.  ?  ? ?Clinical impression ?Continued working on improving glute, medial hamstrings, and ankle strength to promote better mechanics of R LE during gait. Decreased R ankle soreness after manual therapy to decrease R lateral hamstrings muscle tension to tibia to decrease anterior lateral pressure during ambulation.  Pt will benefit from continued skilled physical therapy services to decrease pain, improve strength and function.   ? ? ? ? ? ? ? ? ?  ? ? ?PATIENT EDUCATION: ?Education details: Cardiac nurse instructions ?Person educated: Patient and Spouse ?Education method: Explanation ?Education comprehension: verbalized understanding and

## 2021-10-07 ENCOUNTER — Encounter (HOSPITAL_BASED_OUTPATIENT_CLINIC_OR_DEPARTMENT_OTHER): Payer: Self-pay

## 2021-10-07 ENCOUNTER — Encounter: Payer: Self-pay | Admitting: Internal Medicine

## 2021-10-07 ENCOUNTER — Ambulatory Visit (HOSPITAL_BASED_OUTPATIENT_CLINIC_OR_DEPARTMENT_OTHER): Admit: 2021-10-07 | Payer: Medicare PPO | Admitting: Otolaryngology

## 2021-10-07 SURGERY — INSERTION, HYPOGLOSSAL NERVE STIMULATOR
Anesthesia: General | Laterality: Right

## 2021-10-08 ENCOUNTER — Telehealth: Payer: Self-pay | Admitting: Podiatry

## 2021-10-08 ENCOUNTER — Ambulatory Visit: Payer: Medicare PPO

## 2021-10-08 DIAGNOSIS — R262 Difficulty in walking, not elsewhere classified: Secondary | ICD-10-CM

## 2021-10-08 DIAGNOSIS — M25571 Pain in right ankle and joints of right foot: Secondary | ICD-10-CM

## 2021-10-08 DIAGNOSIS — M6281 Muscle weakness (generalized): Secondary | ICD-10-CM

## 2021-10-08 NOTE — Telephone Encounter (Signed)
Where do I return wheel chair to I don't know the name of the company you order it from  ?

## 2021-10-08 NOTE — Therapy (Signed)
? ?OUTPATIENT PHYSICAL THERAPY TREATMENT NOTE ? ? ? ? ?Patient Name: Kathy Long ?MRN: [id number] ?DOB:1950-11-05, 71 y.o., female ?Today's Date: 10/08/2021 ? ?PCP: [name], MD ?REFERRING PROVIDER: Criselda Peaches, DPM ? ? PT End of Session - 10/08/21 1335   ? ? Visit Number 13   ? Number of Visits 25   ? Date for PT Re-Evaluation 10/22/21   ? Authorization Type 3   ? Authorization Time Period of 10 progress report   ? PT Start Time 1335   ? PT Stop Time 1478   ? PT Time Calculation (min) 40 min   ? Equipment Utilized During Treatment --   SPC and RW  ? Activity Tolerance Patient tolerated treatment well   ? Behavior During Therapy [facility] for tasks assessed/performed   ? ?  ?  ? ?  ? ? ? ? ? ? ? ? ? ?Past Medical History:  ?Diagnosis Date  ? 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 Anxiety   ? Arthritis   ? lower back, hands  ? Cancer [facility])   ? melanoma, shoulder  ? Chronic kidney disease   ? Depression   ? GERD (gastroesophageal reflux disease)   ? Hyperlipidemia   ? Hypertension   ? Postoperative bile leak   ? Sleep apnea   ? 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 ?Past Surgical History:  ?Procedure Laterality Date  ? ANKLE FRACTURE SURGERY Left   ? APPENDECTOMY  2010  ? BACK SURGERY    ? disectomy lumbar, scar tissue  ? BREAST CYST EXCISION Right 1970  ? axilla  ? buninectomy Bilateral   ? CATARACT EXTRACTION W/PHACO Left 04/12/2018  ? Procedure: CATARACT EXTRACTION PHACO AND INTRAOCULAR LENS PLACEMENT ([place])  LEFT TORIC LENS;  Surgeon: Leandrew Koyanagi, MD;  Location: [place];  Service: Ophthalmology;  Laterality: Left;  ? CATARACT EXTRACTION W/PHACO Right 05/10/2018  ? Procedure: CATARACT EXTRACTION PHACO AND INTRAOCULAR LENS PLACEMENT ([place])  RIGHT TORIC LENS;  Surgeon: Leandrew Koyanagi, MD;  Location: [place];  Service: Ophthalmology;  Laterality: Right;  DIABETIC  ? CHOLECYSTECTOMY N/A 07/05/2017  ? Procedure: LAPAROSCOPIC CHOLECYSTECTOMY WITH INTRAOPERATIVE CHOLANGIOGRAM;   Surgeon: Robert Bellow, MD;  Location: [facility];  Service: General;  Laterality: N/A;  ? COLONOSCOPY    ? 2009 and color gaurd in 2018  ? COLONOSCOPY N/A 03/25/2020  ? Procedure: COLONOSCOPY;  Surgeon: [name], MD;  Location: [facility] ENDOSCOPY;  Service: Endoscopy;  Laterality: N/A;  ? DRUG INDUCED ENDOSCOPY N/A 05/06/2021  ? Procedure: DRUG INDUCED SLEEP ENDOSCOPY;  Surgeon: Melida Quitter, MD;  Location: [place];  Service: ENT;  Laterality: N/A;  ? ERCP N/A 07/12/2017  ? Procedure: ENDOSCOPIC RETROGRADE CHOLANGIOPANCREATOGRAPHY (ERCP);  Surgeon: Lucilla Lame, MD;  Location: [facility] ENDOSCOPY;  Service: Endoscopy;  Laterality: N/A;  ? ERCP N/A 10/04/2017  ? Procedure: ENDOSCOPIC RETROGRADE CHOLANGIOPANCREATOGRAPHY (ERCP);  Surgeon: Lucilla Lame, MD;  Location: [facility] ENDOSCOPY;  Service: Endoscopy;  Laterality: N/A;  ? FOOT SURGERY    ? x 2  ? INCISIONAL HERNIA REPAIR N/A 12/04/2020  ? Procedure: VENTRAL INCISIONAL HERNIA REPAIR WITH MESH;  Surgeon: Armandina Gemma, MD;  [facility];  Service: General;  Laterality: N/A;  ? MELANOMA EXCISION Left 2000  ? PALATOPLASTY N/A 04/08/2015  ? Procedure: PALATOPLASTY;  Surgeon: Beverly Gust, MD;  Location: [facility];  Service: ENT;  Laterality: N/A;  ? TONSILLECTOMY N/A 04/08/2015  ? Procedure: TONSILLECTOMY;  Surgeon:  Beverly Gust, MD;  Location: [facility];  Service: ENT;  Laterality: N/A;  ? TUBAL LIGATION    ? ?Patient Active Problem List  ? Diagnosis Date Noted  ? Aortic atherosclerosis ([place]) 05/12/2021  ? Acute COVID-19 05/12/2021  ? Positive colorectal cancer screening using Cologuard test 02/05/2020  ? Esophageal dysphagia 12/11/2019  ? Hyperlipidemia LDL goal <100 12/10/2019  ? Right shoulder pain 10/17/2019  ? Arthritis of right acromioclavicular joint 08/15/2019  ? Piriformis syndrome of left side 08/15/2019  ? OSA (obstructive sleep apnea) 05/13/2019  ? CKD (chronic kidney disease) stage 3, GFR 30-59 ml/min (HCC) 04/13/2018  ? Closed  fracture of distal lateral malleolus of left ankle 12/28/2017  ? 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 S/P laparoscopic cholecystectomy 07/19/2017  ? Hospital discharge follow-up 07/19/2017  ? Acoustic neuroma ([place]) 06/12/2017  ? Colon cancer screening 03/08/2017  ? Nocturia 12/09/2016  ? Major depressive disorder, 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[place]) 06/05/2016  ? Encounter for preventive health examination 03/07/2015  ? Incisional hernia, without obstruction or gangrene 09/28/2014  ? Obesity 09/28/2014  ? Anxiety state 11/19/2013  ? Gastro-esophageal reflux disease without esophagitis 11/19/2013  ? Squamous cell carcinoma of skin of face 10/21/2011  ? 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 Melanoma ([place]) 11/02/2010  ? Essential hypertension 04/10/2010  ? Osteoporosis 04/10/2010  ? ? ?REFERRING DIAG: Z98.890 (ICD-10-CM) - S/P foot surgery, right ? ?THERAPY DIAG:  ?Pain in right ankle and joints of right foot  - Primary M25.571   ?Difficulty in walking, not elsewhere classified  R26.2   ?Muscle weakness (generalized)  M62.81   ? ? ?PERTINENT HISTORY: S/P R subtalar and talonavicular joint arthrodesis 06/05/2021. Has been having foot pain since May 2022. Has been in a boot or something since then like and ace bandage. Pt [place] luggage rack [place] which bothered her foot. Had injections, had x-rays, nothing worked. Had to get surgery. Currently on elequis and metoprolol. Got an apple watch to watch her heart rate. Has Afib once every 24-36 hours. Has not yet had PT since her procedure. Also has an acoustic neuroma L ear which affects her balance, therefore she's using a wheel chair. Pt fell in the bathroom June 06, 2021. Hit her head but her R foot is fine. Head is ok. MD told her that she can weight bear with her CAM boot on Monday August 03, 2021. Start out walking gingerly to see if there is pain, then gradually increase weight for 2-3 days,  then midweek should be fine with full weight bearing. ? ?PRECAUTIONS: atrial fibrillation ? ?SUBJECTIVE:    The arch support helps her foot. Walking better and has not felt any pain on the side of her foot today. Has not had afib since last session. On some medicine. Has a really bad headache though, might be from the medicine.  ? ? ? ? ?PAIN:  ?Are you having pain? 0/10 anterior lateral ankle soreness.  ? ? ? ?Objective ? ? ?[email or web address] ?  ? ?Therapeutic exercise ? ? ?HR at start of session: 65 - 66 bpm ?Blood pressure, L arm sitting, mechanically taken, normal cuff: 149/67, HR 63 ? ? ?Seated R knee flexion targeting medial hamstrings, red band 10x3 ? ?Seated hip ER  ? R 10x5 seconds  ? Then with 2 lbs 10x2 with 5 second holds ? ?Seated clamshell resisted, hips less than 90 degrees flexion  ? Red band  10x3 ? ?Side stepping with red band around knees and with red band around ankles 22 ft to the R and 22 ft to the L 1x. Good glute med  muscle use felt.  ? ? ?Seated hip adduction folded pillow squeeze 10x5 seconds for 3 sets ?  ?Seated R ankle IV AROM 10x2 with 5 seconds to decrease lateral ankle pressure ? ?Seated R ankle PF blue band 10x5 seconds holds for 3 sets ? ?Standing gastroc stretch at wall 30 seconds x 5 R LE ? ? ?Improved exercise technique, movement at target joints, use of target muscles after min to mod verbal, visual, tactile cues.  ? ? ?  ?Response to treatment ?Pt tolerated session well without aggravation of symptoms.  ? ? ? ? ? ?Clinical impression ?Pt making good progress with decreased ankle pain based on subjective reports. Continued working on glute and ankle strengthening to promote better mechanics of R LE during gait. Pt tolerated session well without aggravation of symptoms. Pt will benefit from continued skilled physical therapy services to decrease pain, improve strength and function.  ? ? ? ? ? ? ? ? ?  ? ? ?PATIENT EDUCATION: ?Education details: Cardiac nurse  instructions ?Person educated: Patient and Spouse ?Education method: Explanation ?Education comprehension: verbalized understanding and returned demonstration ? ? ?HOME EXERCISE PROGRAM: ?Access Code: [email or web address] ?URL: https://

## 2021-10-08 NOTE — Telephone Encounter (Signed)
This was handled earlier today - this was the message in mychart from Ammie: ? ? ? ?Good Morning Kathy Long, called [employer] and they will pick up your wheelchair tomorrow but will need you to call them for confirmation '@[phone].We apologize for the inconvenience., thank you for your patience. ? ?Last read by Haydee Salter "Debbie" at  3:14 PM on 10/08/2021. ? ? ? ? ?Thanks ?

## 2021-10-09 ENCOUNTER — Ambulatory Visit: Payer: Medicare PPO | Admitting: Internal Medicine

## 2021-10-09 ENCOUNTER — Encounter: Payer: Self-pay | Admitting: Internal Medicine

## 2021-10-09 VITALS — BP 132/88 | HR 70 | Temp 98.0°F | Ht 62.0 in | Wt 151.0 lb

## 2021-10-09 DIAGNOSIS — Z0000 Encounter for general adult medical examination without abnormal findings: Secondary | ICD-10-CM | POA: Diagnosis not present

## 2021-10-09 DIAGNOSIS — I10 Essential (primary) hypertension: Secondary | ICD-10-CM

## 2021-10-09 DIAGNOSIS — D333 Benign neoplasm of cranial nerves: Secondary | ICD-10-CM | POA: Diagnosis not present

## 2021-10-09 DIAGNOSIS — E785 Hyperlipidemia, unspecified: Secondary | ICD-10-CM

## 2021-10-09 DIAGNOSIS — Z23 Encounter for immunization: Secondary | ICD-10-CM

## 2021-10-09 DIAGNOSIS — Z8616 Personal history of COVID-19: Secondary | ICD-10-CM

## 2021-10-09 DIAGNOSIS — G4733 Obstructive sleep apnea (adult) (pediatric): Secondary | ICD-10-CM

## 2021-10-09 DIAGNOSIS — I480 Paroxysmal atrial fibrillation: Secondary | ICD-10-CM

## 2021-10-09 DIAGNOSIS — R7303 Prediabetes: Secondary | ICD-10-CM | POA: Diagnosis not present

## 2021-10-09 DIAGNOSIS — N1831 Chronic kidney disease, stage 3a: Secondary | ICD-10-CM | POA: Diagnosis not present

## 2021-10-09 HISTORY — DX: Paroxysmal atrial fibrillation: I48.0

## 2021-10-09 MED ORDER — MOUNJARO 5 MG/0.5ML ~~LOC~~ SOAJ: SUBCUTANEOUS | 0 refills | Status: DC

## 2021-10-09 NOTE — Patient Instructions (Addendum)
You look SVELTE! ? ?Please check BP at home periodically (once a week)  ? ?Consider  adding allegra or zyrtec at night  ? ?Use saline irrigation when you come in from outside  ? ?Afrin will help but do not use more than 5 days in a row  ? ? ?Stay at 5 mg Mounjaro and Please start exercising.  Consider a recumbent bike ,  30 minutes daily  ? ? ? ?

## 2021-10-09 NOTE — Assessment & Plan Note (Signed)

## 2021-10-09 NOTE — Progress Notes (Signed)
Patient ID: Kathy Long, female    DOB: [birth year]  Age: 70 y.o. MRN: [id number] ? ?The patient is here for annual pwreventive ellness examination and management of other chronic and acute problems. ?  ?The risk factors are reflected in the social history. ? ?The roster of all physicians providing medical care to patient - is listed in the Snapshot section of the chart. ? ?Activities of daily living:  The patient is 100% independent in all ADLs: dressing, toileting, feeding as well as independent mobility ? ?Home safety : The patient has smoke detectors in the home. They wear seatbelts.  There are no firearms at home. There is no violence in the home.  ? ?There is no risks for hepatitis, STDs or HIV. There is no   history of blood transfusion. They have no travel history to infectious disease endemic areas of the world. ? ?The patient has seen their dentist in the last six month. They have seen their eye doctor in the last year. They admit to slight hearing difficulty with regard to whispered voices and some television programs.  They have deferred audiologic testing in the last year.  They do not  have excessive sun exposure. Discussed the need for sun protection: hats, long sleeves and use of sunscreen if there is significant sun exposure.  ? ?Diet: the importance of a healthy diet is discussed. They do have a healthy diet. ? ?The benefits of regular aerobic exercise were discussed. She walks 4 times per week ,  20 minutes.  ? ?Depression screen: there are no signs or vegative symptoms of depression- irritability, change in appetite, anhedonia, sadness/tearfullness. ? ?Cognitive assessment: the patient manages all their financial and personal affairs and is actively engaged. They could relate day,date,year and events; recalled 2/3 objects at 3 minutes; performed clock-face test normally. ? ?The following portions of the patient's history were reviewed and updated as appropriate: allergies, current medications,  past family history, past medical history,  past surgical history, past social history  and problem list. ? ?Visual acuity was not assessed per patient preference since she has regular follow up with her ophthalmologist. Hearing and body mass index were assessed and reviewed.  ? ?During the course of the visit the patient was educated and counseled about appropriate screening and preventive services including : fall prevention , diabetes screening, nutrition counseling, colorectal cancer screening, and recommended immunizations.   ? ?CC: The primary encounter diagnosis was Essential hypertension. Diagnoses of Hyperlipidemia LDL goal <100, Prediabetes, OSA (obstructive sleep apnea), Paroxysmal atrial fibrillation ([place]), Encounter for preventive health examination, Need for pneumococcal 20-valent conjugate vaccination, Stage 3a chronic kidney disease ([place]), Acoustic neuroma ([place]), and History of COVID-19 were also pertinent to this visit. ? ?1) 30 lb weight loss since starting Mounjaro,   October,  starteed by [facility], but weight has plateaued over the last 3 weeks.  She is NOT EXERCISING due to history of  foot pain , still getting PT. Has foot pain with walks > 100 feet.   Foot pain improved with a  prosthetic insert  given to her by the physical therapist.  Goal is 130 lbs.  ? ?2) POST OPERATIVE ATRIAL FIB MANAGED NOW BY CARDIOLOGY.  Dx: PAF with burden > 20%  . Amlodipine was stopped and metoprolol was doubled,  continue eliquis , flecainide  dose was increased to twice daily  ? ?3) ELEVATED BLOOD PRESSURE: taking telmisartan 80 mg and carvedilol 25 mg bid .  Repeat was 209 systolic.  Does not check at home, last BP at Cardiologist's office was 161 systolic .  Has been 149/90 at PT  ? ?4) Headache,  ethmoid pressure :  aggravated by work outside.  No purulent drainage ? ?5) OSA:  no longer using CPAP per patient preference and ill fitting mask.  ? ?History ?Kathy Long has a past medical history of Acoustic neuroma  ([place]), Anxiety, Arthritis, Cancer ([place]), Chronic kidney disease, Depression, GERD (gastroesophageal reflux disease), Hyperlipidemia, Hypertension, Postoperative bile leak, Sleep apnea, and 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.  ? ?She has a past surgical history that includes Melanoma excision (Left, 2000); Foot surgery; Back surgery; Tonsillectomy (N/A, 04/08/2015); Palatoplasty (N/A, 04/08/2015); Appendectomy (2010); Colonoscopy; Tubal ligation; ERCP (N/A, 07/12/2017); Breast cyst excision (Right, 1970); ERCP (N/A, 10/04/2017); Cataract extraction w/PHACO (Left, 04/12/2018); Cataract extraction w/PHACO (Right, 05/10/2018); Cholecystectomy (N/A, 07/05/2017); Colonoscopy (N/A, 03/25/2020); buninectomy (Bilateral); Ankle fracture surgery (Left); Incisional hernia repair (N/A, 12/04/2020); and Drug induced endoscopy (N/A, 05/06/2021).  ? ?Her family history includes Cancer (age of onset: 92) in her sister.She reports that she has never smoked. She has never used smokeless tobacco. She reports current alcohol use. She reports that she does not use drugs. ? ?Outpatient Medications Prior to Visit  ?Medication Sig Dispense Refill  ? apixaban (ELIQUIS) 5 MG TABS tablet Take 1 tablet (5 mg total) by mouth 2 (two) times daily. 180 tablet 3  ? Cholecalciferol (VITAMIN D3) 75 MCG (3000 UT) TABS Take 6,000 Units by mouth daily.    ? citalopram (CELEXA) 20 MG tablet TAKE 1 TABLET BY MOUTH DAILY 90 tablet 1  ? clobetasol (TEMOVATE) 0.05 % external solution APPLY DAILY UNTIL RASH IS CLEAR 50 mL 0  ? Coenzyme Q10 100 MG capsule Take 100 mg by mouth daily.    ? flecainide (TAMBOCOR) 50 MG tablet Take 1 tablet (50 mg total) by mouth 2 (two) times daily. 180 tablet 3  ? melatonin 5 MG TABS Take 5 mg by mouth at bedtime.    ? metoprolol succinate (TOPROL-XL) 50 MG 24 hr tablet Take 1 tablet (50 mg total) by mouth daily. 90 tablet 3  ? omeprazole (PRILOSEC) 40 MG capsule TAKE 1 CAPSULE BY MOUTH ONCE DAILY 90 capsule 1  ? ondansetron (ZOFRAN) 4 MG  tablet Take 1 tablet (4 mg total) by mouth every 8 (eight) hours as needed for nausea or vomiting. 20 tablet 0  ? rosuvastatin (CRESTOR) 10 MG tablet TAKE ONE TABLET BY MOUTH EVERY DAY 90 tablet 1  ? MOUNJARO 5 MG/0.5ML Pen INJECT '5MG'$  SUBCUTANEOUSLY ONCE A WEEK 6 mL 0  ? gabapentin (NEURONTIN) 300 MG capsule TAKE 1 CAPSULE BY MOUTH 3 TIMES DAILY FOR SEVEN DAYS (Patient not taking: Reported on 07/06/2021) 21 capsule 0  ? guaiFENesin-codeine (CHERATUSSIN AC) 100-10 MG/5ML syrup Take 5 mLs by mouth 3 (three) times daily as needed for cough. (Patient not taking: Reported on 07/06/2021) 120 mL 0  ? oxyCODONE (OXY IR/ROXICODONE) 5 MG immediate release tablet TAKE ONE TABLET BY MOUTH EVERY 4 HOURS AS NEEDED FOR SEVERE PAIN FOR UP TO 7 DAYS (Patient not taking: Reported on 07/06/2021) 28 tablet 0  ? vitamin C (ASCORBIC ACID) 500 MG tablet Take 500 mg by mouth daily. (Patient not taking: Reported on 10/05/2021)    ? ?No facility-administered medications prior to visit.  ? ? ?Review of Systems ? ?Objective:  ?BP 132/88 (BP Location: Left Arm, Cuff Size: Small)   Pulse 70   Temp 98 ?F (36.7 ?C) (Oral)   Ht 5'  2" (1.575 m)   Wt 151 lb (68.5 kg)   SpO2 97%   BMI 27.62 kg/m?  ? ?Physical Exam ? ?Physical Exam  ? ?Assessment & Plan:  ? ?Problem List Items Addressed This Visit   ? ? Hyperlipidemia LDL goal <100  ? Relevant Orders  ? Lipid Profile (Completed)  ? Essential hypertension - Primary  ? Relevant Orders  ? COMPLETE METABOLIC PANEL WITH GFR (Completed)  ? Urine Microalbumin w/creat. ratio (Completed)  ? Encounter for preventive health examination  ?  age appropriate education and counseling updated, referrals for preventative services and immunizations addressed, dietary and smoking counseling addressed, most recent labs reviewed.  I have personally reviewed and have noted: ?  ?1) the patient's medical and social history ?2) The pt's use of alcohol, tobacco, and illicit drugs ?3) The patient's current medications and  supplements ?4) Functional ability including ADL's, fall risk, home safety risk, hearing and visual impairment ?5) Diet and physical activities ?6) Evidence for depression or mood disorder ?7) The patient's height, w

## 2021-10-09 NOTE — Assessment & Plan Note (Signed)
Occurred during foot surgery ,  Thought to be triggered by COVID infection 3 weeks prior  ?

## 2021-10-09 NOTE — Assessment & Plan Note (Addendum)
NO LONGER USING CPAP SINCE LOSING 30 LBS.  HOWEVER GIVEN  HER POST OPERATIVE ATRIAL FIB,  NEEDS SLEEP STUDY .  Considered  Having the INSPIRE PROCEDURE  ?

## 2021-10-10 LAB — COMPLETE METABOLIC PANEL WITH GFR
AG Ratio: 2.3 (calc) (ref 1.0–2.5)
ALT: 19 U/L (ref 6–29)
AST: 18 U/L (ref 10–35)
Albumin: 4.5 g/dL (ref 3.6–5.1)
Alkaline phosphatase (APISO): 89 U/L (ref 37–153)
BUN/Creatinine Ratio: 17 (calc) (ref 6–22)
BUN: 18 mg/dL (ref 7–25)
CO2: 25 mmol/L (ref 20–32)
Calcium: 9.6 mg/dL (ref 8.6–10.4)
Chloride: 106 mmol/L (ref 98–110)
Creat: 1.06 mg/dL — ABNORMAL HIGH (ref 0.60–1.00)
Globulin: 2 g/dL (calc) (ref 1.9–3.7)
Glucose, Bld: 86 mg/dL (ref 65–99)
Potassium: 4.7 mmol/L (ref 3.5–5.3)
Sodium: 142 mmol/L (ref 135–146)
Total Bilirubin: 1.2 mg/dL (ref 0.2–1.2)
Total Protein: 6.5 g/dL (ref 6.1–8.1)
eGFR: 55 mL/min/{1.73_m2} — ABNORMAL LOW (ref >=60)

## 2021-10-10 LAB — LIPID PANEL
Cholesterol: 140 mg/dL (ref <200)
HDL: 48 mg/dL — ABNORMAL LOW (ref >=50)
LDL Cholesterol (Calc): 72 mg/dL (calc)
Non-HDL Cholesterol (Calc): 92 mg/dL (calc) (ref <130)
Total CHOL/HDL Ratio: 2.9 (calc) (ref <5.0)
Triglycerides: 117 mg/dL (ref <150)

## 2021-10-10 LAB — HEMOGLOBIN A1C
Hgb A1c MFr Bld: 5.4 % of total Hgb (ref <5.7)
Mean Plasma Glucose: 108 mg/dL
eAG (mmol/L): 6 mmol/L

## 2021-10-10 LAB — MICROALBUMIN / CREATININE URINE RATIO
Creatinine, Urine: 339 mg/dL — ABNORMAL HIGH (ref 20–275)
Microalb Creat Ratio: 4 mcg/mg creat (ref <30)
Microalb, Ur: 1.3 mg/dL

## 2021-10-11 ENCOUNTER — Encounter: Payer: Self-pay | Admitting: Internal Medicine

## 2021-10-11 NOTE — Assessment & Plan Note (Signed)
GFR stable until recently per patient,  .  Nephrology referral was done in March 2020 and she has had  annual follow up. ?

## 2021-10-11 NOTE — Assessment & Plan Note (Signed)
Her atrial fib has been attributed to her recent COVID infection  ?

## 2021-10-11 NOTE — Assessment & Plan Note (Addendum)
Stable by last MRI in 2018.  She has had Progressive hearing loss noted and balance impairment  

## 2021-10-13 NOTE — Telephone Encounter (Signed)
Blood work was done at appt.  ?

## 2021-10-14 ENCOUNTER — Ambulatory Visit: Payer: Medicare PPO

## 2021-10-14 DIAGNOSIS — M25571 Pain in right ankle and joints of right foot: Secondary | ICD-10-CM | POA: Diagnosis not present

## 2021-10-14 DIAGNOSIS — M6281 Muscle weakness (generalized): Secondary | ICD-10-CM | POA: Diagnosis not present

## 2021-10-14 DIAGNOSIS — R262 Difficulty in walking, not elsewhere classified: Secondary | ICD-10-CM | POA: Diagnosis not present

## 2021-10-14 NOTE — Therapy (Signed)
? ?OUTPATIENT PHYSICAL THERAPY TREATMENT NOTE ? ? ? ? ?Patient Name: Kathy Long ?MRN: [id number] ?DOB:03/29/1951, 71 y.o., female ?Today's Date: 10/14/2021 ? ?PCP: [name], MD ?REFERRING PROVIDER: [name], MD ? ? PT End of Session - 10/14/21 0936   ? ? Visit Number [id number]   ? Number of Visits 25   ? Date for PT Re-Evaluation 10/22/21   ? Authorization Type 4   ? Authorization Time Period of 10 progress report   ? PT Start Time [phone]   ? PT Stop Time 1017   ? PT Time Calculation (min) 40 min   ? Equipment Utilized During Treatment --   SPC and RW  ? Activity Tolerance Patient tolerated treatment well   ? Behavior During Therapy [facility] for tasks assessed/performed   ? ?  ?  ? ?  ? ? ? ? ? ? ? ? ? ? ?Past Medical History:  ?Diagnosis Date  ? Acoustic neuroma ([place])   ? left ear  ? Anxiety   ? Arthritis   ? lower back, hands  ? Cancer [facility])   ? melanoma, shoulder  ? Chronic kidney disease   ? Depression   ? GERD (gastroesophageal reflux disease)   ? Hyperlipidemia   ? Hypertension   ? Postoperative bile leak   ? Sleep apnea   ? 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 ?Past Surgical History:  ?Procedure Laterality Date  ? ANKLE FRACTURE SURGERY Left   ? APPENDECTOMY  2010  ? BACK SURGERY    ? disectomy lumbar, scar tissue  ? BREAST CYST EXCISION Right 1970  ? axilla  ? buninectomy Bilateral   ? CATARACT EXTRACTION W/PHACO Left 04/12/2018  ? Procedure: CATARACT EXTRACTION PHACO AND INTRAOCULAR LENS PLACEMENT ([place])  LEFT TORIC LENS;  Surgeon: Leandrew Koyanagi, MD;  Location: [place];  Service: Ophthalmology;  Laterality: Left;  ? CATARACT EXTRACTION W/PHACO Right 05/10/2018  ? Procedure: CATARACT EXTRACTION PHACO AND INTRAOCULAR LENS PLACEMENT ([place])  RIGHT TORIC LENS;  Surgeon: Leandrew Koyanagi, MD;  Location: [place];  Service: Ophthalmology;  Laterality: Right;  DIABETIC  ? CHOLECYSTECTOMY N/A 07/05/2017  ? Procedure: LAPAROSCOPIC CHOLECYSTECTOMY WITH INTRAOPERATIVE CHOLANGIOGRAM;   Surgeon: Robert Bellow, MD;  Location: [facility];  Service: General;  Laterality: N/A;  ? COLONOSCOPY    ? 2009 and color gaurd in 2018  ? COLONOSCOPY N/A 03/25/2020  ? Procedure: COLONOSCOPY;  Surgeon: [name], MD;  Location: [facility] ENDOSCOPY;  Service: Endoscopy;  Laterality: N/A;  ? DRUG INDUCED ENDOSCOPY N/A 05/06/2021  ? Procedure: DRUG INDUCED SLEEP ENDOSCOPY;  Surgeon: Melida Quitter, MD;  Location: [place];  Service: ENT;  Laterality: N/A;  ? ERCP N/A 07/12/2017  ? Procedure: ENDOSCOPIC RETROGRADE CHOLANGIOPANCREATOGRAPHY (ERCP);  Surgeon: Lucilla Lame, MD;  Location: [facility]OSCOPY;  Service: Endoscopy;  Laterality: N/A;  ? ERCP N/A 10/04/2017  ? Procedure: ENDOSCOPIC RETROGRADE CHOLANGIOPANCREATOGRAPHY (ERCP);  Surgeon: Lucilla Lame, MD;  Location: [facility] ENDOSCOPY;  Service: Endoscopy;  Laterality: N/A;  ? FOOT SURGERY    ? x 2  ? INCISIONAL HERNIA REPAIR N/A 12/04/2020  ? Procedure: VENTRAL INCISIONAL HERNIA REPAIR WITH MESH;  Surgeon: Armandina Gemma, MD;  Location: [facility];  Service: General;  Laterality: N/A;  ? MELANOMA EXCISION Left 2000  ? PALATOPLASTY N/A 04/08/2015  ? Procedure: PALATOPLASTY;  Surgeon: Beverly Gust, MD;  Location: [facility] OR[facility];  Service: ENT;  Laterality: N/A;  ? TONSILLECTOMY N/A 04/08/2015  ? Procedure: TONSILLECTOMY;  Surgeon: Beverly Gust, MD;  Location: [facility] OR[facility];  Service: ENT;  Laterality: N/A;  ? TUBAL LIGATION    ? ?Patient Active Problem List  ? Diagnosis Date Noted  ? Paroxysmal atrial fibrillation ([place]) 10/09/2021  ? Aortic atherosclerosis ([place]) 05/12/2021  ? History of COVID-19 05/12/2021  ? Positive colorectal cancer screening using Cologuard test 02/05/2020  ? Esophageal dysphagia 12/11/2019  ? Hyperlipidemia LDL goal <100 12/10/2019  ? Right shoulder pain 10/17/2019  ? Arthritis of right acromioclavicular joint 08/15/2019  ? Piriformis syndrome of left side 08/15/2019  ? OSA (obstructive sleep apnea) 05/13/2019  ? CKD (chronic kidney  disease) stage 3, GFR 30-59 ml/min (HCC) 04/13/2018  ? Closed fracture of distal lateral malleolus of left ankle 12/28/2017  ? 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 S/P laparoscopic cholecystectomy 07/19/2017  ? Hospital discharge follow-up 07/19/2017  ? Acoustic neuroma ([place]) 06/12/2017  ? Colon cancer screening 03/08/2017  ? Nocturia 12/09/2016  ? 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 Encounter for preventive health examination 03/07/2015  ? Incisional hernia, without obstruction or gangrene 09/28/2014  ? Obesity 09/28/2014  ? Anxiety state 11/19/2013  ? Gastro-esophageal reflux disease without esophagitis 11/19/2013  ? Squamous cell carcinoma of skin of face 10/21/2011  ? 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 Melanoma ([place]) 11/02/2010  ? Essential hypertension 04/10/2010  ? Osteoporosis 04/10/2010  ? ? ?REFERRING DIAG: Z98.890 (ICD-10-CM) - S/P foot surgery, right ? ?THERAPY DIAG:  ?Pain in right ankle and joints of right foot  - Primary M25.571   ?Difficulty in walking, not elsewhere classified  R26.2   ?Muscle weakness (generalized)  M62.81   ? ? ?PERTINENT HISTORY: S/P R subtalar and talonavicular joint arthrodesis 06/05/2021. Has been having foot pain since May 2022. Has been in a boot or something since then like and ace bandage. Pt push a luggage rack up a [place] the [place]22 which bothered her foot. Had injections, had x-rays, nothing worked. Had to get surgery. Currently on elequis and metoprolol. Got an apple watch to watch her heart rate. Has Afib once every 24-36 hours. Has not yet had PT since her procedure. Also has an acoustic neuroma L ear which affects her balance, therefore she's using a wheel chair. Pt fell in the bathroom June 06, 2021. Hit her head but her R foot is fine. Head is ok. MD told her that she can weight bear with her CAM boot on Monday August 03, 2021. Start out walking gingerly to see  if there is pain, then gradually increase weight for 2-3 days, then midweek should be fine with full weight bearing. ? ?PRECAUTIONS: atrial fibrillation ? ?SUBJECTIVE:    R foot is good, no pain or discomfort, just a little hesitation. Afib is pretty good. Blood pressure is high though. Wants to work on ankle balance with PT... more visits needed.  ? ? ?PAIN:  ?Are you having pain? 0/10 anterior lateral ankle soreness.  ? ? ? ?Objective ? ? [email or web address] ?  ? ?Therapeutic exercise ? ? ?HR at start of session: 58 bpm ?Blood pressure, L arm sitting, mechanically taken, normal cuff: 116/54, HR 58 ? ? ?Standing with B UE assist  ? ?Rockerboard ankle PF/DF 2 minutes  ? ?SLS balance with one UE light touch assist  ? R 5x5 seconds for 3 sets ? ? ?Tandem walk CGA 10 ft forward and backward with R UE assist from PT 4x ? Cues  for placing her center of gravity over her base of support (foot). Improved balance with improved practice. ? ? ?Forward step up onto air ex pad with L UE assist  ? R foot 10x3 ? ?Standing heel toe raises with B UE assist 5x each way ? R anterior lateral ankle discomfort with PF.  ? ? Then B toe raises with B UE assist 10x  ? ?Gait with emphasis on heel strike 200 ft ? ?Side stepping with green band around ankles 12 ft to the R and 12 ft to the L 1x. ? ? ? ?Improved exercise technique, movement at target joints, use of target muscles after min to mod verbal, visual, tactile cues.  ? ? ?  ?Response to treatment ?Pt tolerated session well without aggravation of symptoms.  ? ? ?Clinical impression ?Worked on closed chain glute and ankle strengthening to promote R LE single leg stance strength for balance and gait. Pt tolerated session well without aggravation of symptoms. Pt will benefit from continued skilled physical therapy services to decrease pain, improve strength and function.  ? ? ? ? ? ? ? ? ?  ? ? ?PATIENT EDUCATION: ?Education details: Cardiac nurse instructions ?Person educated: Patient  and Spouse ?Education method: Explanation ?Education comprehension: verbalized understanding and returned demonstration ? ? ?HOME EXERCISE PROGRAM: ?Access Code: [email or web address] ?URL: [email or web address]

## 2021-10-16 ENCOUNTER — Other Ambulatory Visit: Payer: Self-pay | Admitting: Internal Medicine

## 2021-10-20 ENCOUNTER — Ambulatory Visit: Payer: Medicare PPO

## 2021-10-20 ENCOUNTER — Telehealth: Payer: Self-pay

## 2021-10-20 DIAGNOSIS — M25571 Pain in right ankle and joints of right foot: Secondary | ICD-10-CM

## 2021-10-20 DIAGNOSIS — M6281 Muscle weakness (generalized): Secondary | ICD-10-CM

## 2021-10-20 DIAGNOSIS — R262 Difficulty in walking, not elsewhere classified: Secondary | ICD-10-CM | POA: Diagnosis not present

## 2021-10-20 NOTE — Therapy (Signed)
OUTPATIENT PHYSICAL THERAPY TREATMENT NOTE   Patient Name: Kathy Long MRN: [id number] DOB:1950-08-14, 71 y.o., female Today's Date: 10/20/2021  PCP: [name], MD REFERRING PROVIDER: Criselda Peaches, DPM    PT End of Session - 10/20/21 1504     Visit Number 15    Number of Visits 25    Date for PT Re-Evaluation 11/19/21    Authorization Type 4    Authorization Time Period of 10 progress report    PT Start Time 0092    PT Stop Time 1546    PT Time Calculation (min) 41 min    Equipment Utilized During Treatment --    Activity Tolerance Patient tolerated treatment well    Behavior During Therapy WFL for tasks assessed/performed             Past Medical History:  Diagnosis Date   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)    melanoma, shoulder   Chronic kidney disease    Depression    GERD (gastroesophageal reflux disease)    Hyperlipidemia    Hypertension    Postoperative bile leak    Sleep apnea    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    Past Surgical History:  Procedure Laterality Date   ANKLE FRACTURE SURGERY Left    APPENDECTOMY  2010   BACK SURGERY     disectomy lumbar, scar tissue   BREAST CYST EXCISION Right 1970   axilla   buninectomy Bilateral    CATARACT EXTRACTION W/PHACO Left 04/12/2018   Procedure: CATARACT EXTRACTION PHACO AND INTRAOCULAR LENS PLACEMENT ([place])  LEFT TORIC LENS;  Surgeon: Leandrew Koyanagi, MD;  Location: [place];  Service: Ophthalmology;  Laterality: Left;   CATARACT EXTRACTION W/PHACO Right 05/10/2018   Procedure: CATARACT EXTRACTION PHACO AND INTRAOCULAR LENS PLACEMENT ([place])  RIGHT TORIC LENS;  Surgeon: Leandrew Koyanagi, MD;  Location: [place];  Service: Ophthalmology;  Laterality: Right;  DIABETIC   CHOLECYSTECTOMY N/A 07/05/2017   Procedure: LAPAROSCOPIC CHOLECYSTECTOMY WITH INTRAOPERATIVE CHOLANGIOGRAM;  Surgeon: Robert Bellow, MD;   Location: [facility];  Service: General;  Laterality: N/A;   COLONOSCOPY     2009 and color gaurd in 2018   COLONOSCOPY N/A 03/25/2020   Procedure: COLONOSCOPY;  Surgeon: [name], MD;  Location: [facility];  Service: Endoscopy;  Laterality: N/A;   DRUG INDUCED ENDOSCOPY N/A 05/06/2021   Procedure: DRUG INDUCED SLEEP ENDOSCOPY;  Surgeon: Melida Quitter, MD;  Location: [place];  Service: ENT;  Laterality: N/A;   ERCP N/A 07/12/2017   Procedure: ENDOSCOPIC RETROGRADE CHOLANGIOPANCREATOGRAPHY (ERCP);  Surgeon: Lucilla Lame, MD;  Location: [facility] END[facility]COPY;  Service: Endoscopy;  Laterality: N/A;   ERCP N/A 10/04/2017   Procedure: ENDOSCOPIC RETROGRADE CHOLANGIOPANCREATOGRAPHY (ERCP);  Surgeon: Lucilla Lame, MD;  Location: [facility] ENDOSCOPY;  Service: Endoscopy;  Laterality: N/A;   FOOT SURGERY     x 2   INCISIONAL HERNIA REPAIR N/A 12/04/2020   Procedure: VENTRAL INCISIONAL HERNIA REPAIR WITH MESH;  Surgeon: Armandina Gemma, MD;  Location: [facility];  Service: General;  Laterality: N/A;   MELANOMA EXCISION Left 2000   PALATOPLASTY N/A 04/08/2015   Procedure: PALATOPLASTY;  Surgeon: Beverly Gust, MD;  Location: [facility];  Service: ENT;  Laterality: N/A;   TONSILLECTOMY N/A 04/08/2015   Procedure: TONSILLECTOMY;  Surgeon: Beverly Gust, MD;  Location: [facility];  Service: ENT;  Laterality: N/A;  TUBAL LIGATION     Patient Active Problem List   Diagnosis Date Noted   Paroxysmal atrial fibrillation ([place]) 10/09/2021   Aortic atherosclerosis ([place]) 05/12/2021   History of COVID-19 05/12/2021   Positive colorectal cancer screening using Cologuard test 02/05/2020   Esophageal dysphagia 12/11/2019   Hyperlipidemia LDL goal <100 12/10/2019   Right shoulder pain 10/17/2019   Arthritis of right acromioclavicular joint 08/15/2019   Piriformis syndrome of left side 08/15/2019   OSA (obstructive sleep apnea) 05/13/2019   CKD (chronic kidney disease) stage 3, GFR 30-59 ml/min (HCC)  04/13/2018   Closed fracture of distal lateral malleolus of left ankle 12/28/2017   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   S/P laparoscopic cholecystectomy 07/19/2017   Hospital discharge follow-up 07/19/2017   Acoustic neuroma ([place]) 06/12/2017   Colon cancer screening 03/08/2017   Nocturia 12/09/2016   Major depressive disorder, 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[place]) 06/05/2016   Encounter for preventive health examination 03/07/2015   Incisional hernia, without obstruction or gangrene 09/28/2014   Obesity 09/28/2014   Anxiety state 11/19/2013   Gastro-esophageal reflux disease without esophagitis 11/19/2013   Squamous cell carcinoma of skin of face 10/21/2011   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 Has been having foot pain since May 2022. Has been in a boot or something since then like and ace bandage. Pt push a luggage rack [place] which bothered her foot. Had injections, had x-rays, nothing worked. Had to get surgery. Currently on elequis and metoprolol. Got an apple watch to watch her heart rate. Has Afib once every 24-36 hours. Has not yet had PT since her procedure. Also has an acoustic neuroma L ear which affects her balance, therefore she's using a wheel chair. Pt fell in the bathroom June 06, 2021. Hit her head but her R foot is fine. Head is ok. MD told her that she can weight bear with her CAM boot on Monday August 03, 2021. Start out walking gingerly to see if there is pain, then gradually  increase weight for 2-3 days, then midweek should be fine with full weight bearing.  PRECAUTIONS: atrial fibrillation  SUBJECTIVE:    R foot is doing well. Did a lot of walking Saturday and R foot did well.   PAIN:  Are you having pain? R lateral ankle discomfort secondary to no arch support in shoe.     Objective   [email or web address]    Therapeutic exercise   HR at start of session: 58 - 59 bpm  Seated manually resisted hip flexion, knee flexion, knee extension, ankle DF, PF, Prone hip extension, S/L hip abduction R LE  Supine R ankle AROM DF and PF   Reviewed progress/current status with PT towards goals.   S/L hip abduction   R 10x, then 5x   Directed patient with gait with normal gait speed, with changes in speed, 180 degree pivot turn, with R and L cervical rotation position, with cervical flexion and extension position, stepping around obstacles, stepping over an  obstacle, ascending and descending 4 regular steps with UE assist   Standing at wall R gastroc stretch 30 seconds x 3   SLS balance with one UE assist   R 5x5 seconds for 2 sets  Weak R single leg balance strength      Improved exercise technique, movement at target joints, use of target muscles after min to mod verbal, visual, tactile cues.      Response to treatment Pt tolerated session well without aggravation of symptoms.    Clinical impression  Pt demonstrates improved R LE strength, function and ability to ambulate since initial evaluation. Pt making very good progress with physical therapy towards goals. Pt expresses desire to improve balance. Dynamic Gait Index performed in which pt scored 15 points ( less than 19 suggests increased fall risk) suggesting decreased balance. Pt will benefit from continued skilled physical therapy services to improve balance, as well as to continue to improve strength, and function.              PATIENT EDUCATION: Education details: Cardiac  nurse instructions Person educated: Patient and Spouse Education method: Explanation Education comprehension: verbalized understanding and returned demonstration   HOME EXERCISE PROGRAM: Access Code: [email or web address] URL: [email or web address] Date: 08/18/2021 Prepared by: Joneen Boers  Exercises Supine Ankle Pumps - 5 x daily - 7 x weekly - 3 sets - 10 reps Supine Active Straight Leg Raise - 1 x daily - 7 x weekly - 3 sets - 10 reps Sidelying Hip Abduction - 1 x daily - 7 x weekly - 3 sets - 5 reps Supine Posterior Pelvic Tilt - 1 x daily - 7 x weekly - 3 sets - 10 reps - 5 seconds hold - Seated Gastroc Stretch with Strap  - 3 x daily - 7 x weekly - 1 sets - 3 reps - 30 seconds hold - Standing Gastroc Stretch at Counter  - 2 x daily - 7 x weekly - 1 sets - 3 reps - 30 seconds hold - Seated Ankle Plantar Flexion with Resistance Loop  - 1 x daily - 7 x weekly - 2-3 sets - 10 reps - 5 second hold        PT Long Term Goals - 10/20/21 1508       PT LONG TERM GOAL #1   Title Pt will improve R ankle DF AROM to 15 degrees and PF to 50 degrees to promote ability to ambulate with less difficulty.    Baseline R ankle DF 5 degrees, PF 32 degrees (07/29/2021); R ankle DF 9 degrees, PF 46 degres (10/20/2021)    Time 4    Period Weeks    Status Partially Met    Target Date 11/19/21      PT LONG TERM GOAL #2   Title Pt will improve R LE strength by at least 1/2 MMT grade to promote ability to perform standing tasks and ambulate with less difficulty.    Baseline R hip flexion 4/5, extension 3+/5, abduction 4-/5, knee flexion and extension 4+/5. Ankle strength not yet tested (07/29/2021); R hip flexion 4+/5, extension 4/5, hip abduction 4+/5, knee flexion and extension 5/5, ankle DF 4+/5, ankle PF (manually resisted) 5/5 (10/20/2021)    Time 12    Period Weeks    Status Achieved    Target Date 10/22/21      PT LONG TERM GOAL #3   Title Pt will be able to ambulate at least 500 ft with  regular shoe and no AD to  promote mobility.    Baseline RW with R CAM boot, R LE NWB, 5 ft (07/29/2021); Able to ambulate > 500 ft (4 days ago) with regular shoe, no pain. (10/20/2021)    Time 12    Period Weeks    Status Achieved    Target Date 10/22/21      PT LONG TERM GOAL #4   Title Pt will improve her R foot FOTO score by at least 15 points as a demonstration of improved function.    Baseline R foot FOTO emailed to pt (score: 54 ) (07/29/2021); 65 (10/20/2021)    Time 12    Period Weeks    Status Achieved    Target Date 10/22/21      PT LONG TERM GOAL #5   Title Pt will improve her DGI score to > 19 as a demonnstration of improved balance and decreased fall risk.    Baseline DGI 16 (10/20/2021)    Time 4    Period Weeks    Status New    Target Date 11/19/21              Plan - 10/20/21 1503     Clinical Impression Statement Pt demonstrates improved R LE strength, function and ability to ambulate since initial evaluation. Pt making very good progress with physical therapy towards goals. Pt expresses desire to improve balance. Dynamic Gait Index performed in which pt scored 15 points ( less than 19 suggests increased fall risk) suggesting decreased balance. Pt will benefit from continued skilled physical therapy services to improve balance, as well as to continue to improve strength, and function.    Personal Factors and Comorbidities Age;Comorbidity 3+;Fitness;Time since onset of injury/illness/exacerbation    Comorbidities Acoustic neuroma L ear, anxiety, CA, depression, chronic kidney disease, HTN    Examination-Activity Limitations Bathing;Locomotion Level;Transfers;Carry;Squat;Stairs;Lift    Stability/Clinical Decision Making Stable/Uncomplicated   Afib 2/35/5732   Clinical Decision Making Low    Rehab Potential Fair    PT Frequency 2x / week    PT Duration 4 weeks    PT Treatment/Interventions Neuromuscular re-education;Gait training;Stair training;Functional mobility  training;Therapeutic activities;Therapeutic exercise;Balance training;Patient/family education;Manual techniques;Scar mobilization;Dry needling;Vestibular;Electrical Stimulation;Canalith Repostioning;Iontophoresis 4mg /ml Dexamethasone    PT Next Visit Plan Ankle ROM (DF, PF), hip and knee strengthening, gait, manual techniques, modalities PRN    PT Home Exercise Plan [place] and Agree with Plan of Care Patient              Joneen Boers PT, DPT  10/20/2021, 4:58 PM

## 2021-10-20 NOTE — Telephone Encounter (Signed)
Lmov to reschedule treadmill to 5/31 at 3 pm

## 2021-10-28 ENCOUNTER — Telehealth: Payer: Self-pay

## 2021-10-28 ENCOUNTER — Ambulatory Visit: Payer: Medicare PPO

## 2021-10-28 DIAGNOSIS — M25571 Pain in right ankle and joints of right foot: Secondary | ICD-10-CM

## 2021-10-28 DIAGNOSIS — M6281 Muscle weakness (generalized): Secondary | ICD-10-CM

## 2021-10-28 DIAGNOSIS — R262 Difficulty in walking, not elsewhere classified: Secondary | ICD-10-CM | POA: Diagnosis not present

## 2021-10-28 NOTE — Telephone Encounter (Signed)
Called patient and left a detailed VM per DPR on file, reminding her of Stress test scheduled for tomorrow at 3:00. Also reviewed instructions listed below, and encouraged a call back if she had any questions or concerns.    Instructions regarding medication:    Do NOT hold metoprolol or flecainide. Take all your medications as scheduled.     How to prepare for your Stress test:   Do not eat or drink anything 4 hours before the test or as told by your doctor No caffeine for 24 hours prior to test No smoking 24 hours prior to test. Ladies, please do not wear dresses.  Skirts or pants are appropriate. Please wear a short sleeve shirt and sports bra if possible. No perfume, cologne or lotion. Wear comfortable walking shoes. No heels!

## 2021-10-28 NOTE — Therapy (Signed)
OUTPATIENT PHYSICAL THERAPY TREATMENT NOTE     Patient Name: Kathy Long MRN: [id number] DOB:1951/03/04, 71 y.o., female Today's Date: 10/28/2021  PCP: [name], MD REFERRING PROVIDER: Criselda Peaches, DPM   PT End of Session - 10/28/21 0802     Visit Number 16    Number of Visits 25    Date for PT Re-Evaluation 11/19/21    Authorization Type 5    Authorization Time Period of 10 progress report    PT Start Time 0803    PT Stop Time 0844    PT Time Calculation (min) 41 min    Activity Tolerance Patient tolerated treatment well    Behavior During Therapy WFL for tasks assessed/performed                      Past Medical History:  Diagnosis Date   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   Anxiety    Arthritis    lower back, hands   Cancer ([place])    melanoma, shoulder   Chronic kidney disease    Depression    GERD (gastroesophageal reflux disease)    Hyperlipidemia    Hypertension    Postoperative bile leak    Sleep apnea    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    Past Surgical History:  Procedure Laterality Date   ANKLE FRACTURE SURGERY Left    APPENDECTOMY  2010   BACK SURGERY     disectomy lumbar, scar tissue   BREAST CYST EXCISION Right 1970   axilla   buninectomy Bilateral    CATARACT EXTRACTION W/PHACO Left 04/12/2018   Procedure: CATARACT EXTRACTION PHACO AND INTRAOCULAR LENS PLACEMENT ([place])  LEFT TORIC LENS;  Surgeon: Leandrew Koyanagi, MD;  Location: [place];  Service: Ophthalmology;  Laterality: Left;   CATARACT EXTRACTION W/PHACO Right 05/10/2018   Procedure: CATARACT EXTRACTION PHACO AND INTRAOCULAR LENS PLACEMENT ([place])  RIGHT TORIC LENS;  Surgeon: Leandrew Koyanagi, MD;  Location: [place];  Service: Ophthalmology;  Laterality: Right;  DIABETIC   CHOLECYSTECTOMY N/A 07/05/2017   Procedure: LAPAROSCOPIC CHOLECYSTECTOMY WITH INTRAOPERATIVE CHOLANGIOGRAM;  Surgeon: Robert Bellow, MD;  Location: [facility];   Service: General;  Laterality: N/A;   COLONOSCOPY     2009 and color gaurd in 2018   COLONOSCOPY N/A 03/25/2020   Procedure: COLONOSCOPY;  Surgeon: [name], MD;  Location: [facility];  Service: Endoscopy;  Laterality: N/A;   DRUG INDUCED ENDOSCOPY N/A 05/06/2021   Procedure: DRUG INDUCED SLEEP ENDOSCOPY;  Surgeon: Melida Quitter, MD;  Location: [place];  Service: ENT;  Laterality: N/A;   ERCP N/A 07/12/2017   Procedure: ENDOSCOPIC RETROGRADE CHOLANGIOPANCREATOGRAPHY (ERCP);  Surgeon: Lucilla Lame, MD;  Location: [facility];  Service: Endoscopy;  Laterality: N/A;   ERCP N/A 10/04/2017   Procedure: ENDOSCOPIC RETROGRADE CHOLANGIOPANCREATOGRAPHY (ERCP);  Surgeon: Lucilla Lame, MD;  Location: [facility];  Service: Endoscopy;  Laterality: N/A;   FOOT SURGERY     x 2   INCISIONAL HERNIA REPAIR N/A 12/04/2020   Procedure: VENTRAL INCISIONAL HERNIA REPAIR WITH MESH;  Surgeon: Armandina Gemma, MD;  Location: [facility];  Service: General;  Laterality: N/A;   MELANOMA EXCISION Left 2000   PALATOPLASTY N/A 04/08/2015   Procedure: PALATOPLASTY;  Surgeon: Beverly Gust, MD;  Location: [facility];  Service: ENT;  Laterality: N/A;   TONSILLECTOMY N/A 04/08/2015   Procedure: TONSILLECTOMY;  Surgeon: Beverly Gust, MD;  Location: [facility];  Service: ENT;  Laterality: N/A;   TUBAL LIGATION     Patient Active Problem List   Diagnosis Date Noted   Paroxysmal atrial fibrillation ([place]) 10/09/2021   Aortic atherosclerosis ([place]) 05/12/2021   History of COVID-19 05/12/2021   Positive colorectal cancer screening using Cologuard test 02/05/2020   Esophageal dysphagia 12/11/2019   Hyperlipidemia LDL goal <100 12/10/2019   Right shoulder pain 10/17/2019   Arthritis of right acromioclavicular joint 08/15/2019   Piriformis syndrome of left side 08/15/2019   OSA (obstructive sleep apnea) 05/13/2019   CKD (chronic kidney disease) stage 3, GFR 30-59 ml/min (HCC) 04/13/2018   Closed  fracture of distal lateral malleolus of left ankle 12/28/2017   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   S/P laparoscopic cholecystectomy 07/19/2017   Hospital discharge follow-up 07/19/2017   Acoustic neuroma ([place]) 06/12/2017   Colon cancer screening 03/08/2017   Nocturia 12/09/2016   Major depressive disorder, 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[place]) 06/05/2016   Encounter for preventive health examination 03/07/2015   Incisional hernia, without obstruction or gangrene 09/28/2014   Obesity 09/28/2014   Anxiety state 11/19/2013   Gastro-esophageal reflux disease without esophagitis 11/19/2013   Squamous cell carcinoma of skin of face 10/21/2011   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 Has been having foot pain since May 2022. Has been in a boot or something since then like and ace bandage. Pt push a luggage rack [place]22 which bothered her foot. Had injections, had x-rays, nothing worked. Had to get surgery. Currently on elequis and metoprolol. Got an apple watch to watch her heart rate. Has Afib once every 24-36 hours. Has not yet had PT since her procedure. Also has an acoustic neuroma L ear which affects her balance, therefore she's using a wheel chair. Pt fell in the bathroom June 06, 2021. Hit her head but her R foot is fine. Head is ok. MD told her that she can weight bear with her CAM boot on Monday August 03, 2021. Start out walking gingerly to see if there is pain, then gradually increase weight for 2-3 days,  then midweek should be fine with full weight bearing.  PRECAUTIONS: atrial fibrillation  SUBJECTIVE:    R foot was a little bit sore this weekend. Has been cleaning which involves fast pivots.     PAIN:  Are you having pain? 2/10 anterior lateral ankle soreness.     Objective   [email or web address]    Therapeutic exercise   HR at start of session: 69-72 bpm  Seated R ankle PF with IV 10x5 seconds for 2 sets to decrease anterior lateral ankle pressure   S/L hip abduction              R 10x2  Hooklying hip extension isometric, leg straight  R 10x5 seconds for 3 sets   Standing with B UE assist:   Standing B heel raises with B UE assist 10x3   Rockerboard ankle PF/DF 2 minutes   Side stepping with red band around ankles 32 ft to the  R and 32 ft to the L 1x.  Tandem walk CGA 10 ft forward and backward with CGA to min assist from PT 4x  Cues for placing her center of gravity over her base of support (foot). Improved balance with improved practice.  SLS balance with one UE light touch assist   R 10x5 seconds for 2 sets  Forward step up onto air ex pad with L UE assist   R foot 10x2     Improved exercise technique, movement at target joints, use of target muscles after min to mod verbal, visual, tactile cues.      Response to treatment Pt tolerated session well without aggravation of symptoms.    Clinical impression Worked on decreasing R anterior lateral ankle pressure as well as worked on closed chain glute and ankle strengthening to promote R LE single leg stance strength for balance and gait. No R ankle soreness reported after session. Pt will benefit from continued skilled physical therapy services to decrease pain, improve strength and function.              PATIENT EDUCATION: Education details: Cardiac nurse instructions Person educated: Patient and Spouse Education method: Explanation Education comprehension: verbalized understanding and  returned demonstration   HOME EXERCISE PROGRAM: Access Code: [email or web address] URL: [email or web address] Date: 08/18/2021 Prepared by: Joneen Boers  Exercises Supine Ankle Pumps - 5 x daily - 7 x weekly - 3 sets - 10 reps Supine Active Straight Leg Raise - 1 x daily - 7 x weekly - 3 sets - 10 reps Sidelying Hip Abduction - 1 x daily - 7 x weekly - 3 sets - 5 reps Supine Posterior Pelvic Tilt - 1 x daily - 7 x weekly - 3 sets - 10 reps - 5 seconds hold - Seated Gastroc Stretch with Strap  - 3 x daily - 7 x weekly - 1 sets - 3 reps - 30 seconds hold - Standing Gastroc Stretch at Counter  - 2 x daily - 7 x weekly - 1 sets - 3 reps - 30 seconds hold - Seated Ankle Plantar Flexion with Resistance Loop  - 1 x daily - 7 x weekly - 2-3 sets - 10 reps - 5 second hold       PT Short Term Goals      PT SHORT TERM GOAL #1   Title Pt will be independent with her initial HEP to improve ankle AROM, R LE strength, and ability to ambulate with less difficulty.    Baseline Pt has started her HEP (07/29/2021); Doing her HEP, seems to help, no questions. (09/28/2021)   Time 3    Period Weeks    Status Achieved.    Target Date 08/20/21              PT Long Term Goals       PT LONG TERM GOAL #1   Title Pt will improve R ankle DF AROM to 15 degrees and PF to 50 degrees to promote ability to ambulate with less difficulty.    Baseline R ankle DF 5 degrees, PF 32 degrees (07/29/2021); DF 8 degrees, PF 45 degrees (09/28/2021)   Time 12    Period Weeks    Status Partially met   Target Date 11/19/21      PT LONG TERM GOAL #2   Title Pt will improve R LE strength by at least 1/2 MMT grade to promote ability to perform standing tasks and ambulate with less difficulty.  Baseline R hip flexion 4/5, extension 3+/5, abduction 4-/5, knee flexion and extension 4+/5. Ankle strength not yet tested (07/29/2021); hip flexion 4+/5, knee flexion 5/5, knee extension 5/5, ankle DF 4+/5, PF 5/5 (manually  resisted), hip extension 4/5, hip abduction 4+/5 (09/28/2021)   Time 12    Period Weeks    Status Achieved.    Target Date 10/22/21      PT LONG TERM GOAL #3   Title Pt will be able to ambulate at least 500 ft with regular shoe and no AD to promote mobility.    Baseline RW with R CAM boot, R LE NWB, 5 ft (07/29/2021); able to ambulate with normal shoe, no ankle brace, R heel discomfort (09/28/2021)   Time 12    Period Weeks    Status Achieved.    Target Date 10/22/21      PT LONG TERM GOAL #4   Title Pt will improve her R foot FOTO score by at least 15 points as a demonstration of improved function.    Baseline R foot FOTO emailed to pt (07/29/2021); initial FOTO for 07/29/2021 not completed. Initial FOTO 54 dated today 09/28/2021; 65 (10/20/2021)   Time 12    Period Weeks    Status Achieved   Target Date 10/22/21      PT LONG TERM GOAL #5    Title Pt will improve her DGI score to > 19 as a demonnstration of improved balance and decreased fall risk.     Baseline DGI 16 (10/20/2021)     Time 4     Period Weeks     Status New     Target Date 11/19/21             Plan     Clinical Impression Statement Worked on decreasing R anterior lateral ankle pressure as well as worked on closed chain glute and ankle strengthening to promote R LE single leg stance strength for balance and gait. No R ankle soreness reported after session. Pt will benefit from continued skilled physical therapy services to decrease pain, improve strength and function.       Personal Factors and Comorbidities Age;Comorbidity 3+;Fitness;Time since onset of injury/illness/exacerbation    Comorbidities Acoustic neuroma L ear, anxiety, CA, depression, chronic kidney disease, HTN    Examination-Activity Limitations Bathing;Locomotion Level;Transfers;Carry;Squat;Stairs;Lift    Stability/Clinical Decision Making Evolving/Moderate complexity   Afib 08/18/2021   Clinical Decision Making Moderate    Rehab Potential Fair    PT  Frequency 2x / week    PT Duration 4 weeks    PT Treatment/Interventions Neuromuscular re-education;Gait training;Stair training;Functional mobility training;Therapeutic activities;Therapeutic exercise;Balance training;Patient/family education;Manual techniques;Scar mobilization;Dry needling;Vestibular;Electrical Stimulation;Canalith Repostioning;Iontophoresis 74m/ml Dexamethasone    PT Next Visit Plan Ankle ROM (DF, PF), hip and knee strengthening, gait, manual techniques, modalities PRN    PT Home Exercise Plan MElsieand Agree with Plan of Care Patient               MJoneen BoersPT, DPT  10/28/2021, 9:18 AM

## 2021-10-29 ENCOUNTER — Telehealth: Payer: Self-pay | Admitting: Internal Medicine

## 2021-10-29 NOTE — Telephone Encounter (Signed)
Copied from [place] [phone]. Topic: Medicare AWV >> Oct 29, 2021 11:09 AM Harris-Coley, Hannah Beat wrote: Reason for CRM: Left message for patient to schedule Annual Wellness Visit.  Please schedule with Nurse Health Advisor Denisa O'Brien-Blaney, LPN at [facility].  Please call [phone] ask for [facility]

## 2021-10-30 ENCOUNTER — Encounter: Payer: Self-pay | Admitting: Internal Medicine

## 2021-10-30 ENCOUNTER — Telehealth: Payer: Self-pay

## 2021-10-30 DIAGNOSIS — R2689 Other abnormalities of gait and mobility: Secondary | ICD-10-CM

## 2021-10-30 NOTE — Telephone Encounter (Signed)
A home sleep study was ordered for pt on 10/09/2021 and she stated that she has not heard from anybody.

## 2021-10-30 NOTE — Telephone Encounter (Signed)
I have pended the PT referral for [facility] for approval. Also sent [place] a message about the sleep study that was ordered on 10/09/2021.

## 2021-11-01 NOTE — Telephone Encounter (Signed)
I did some digging.  Unfortunately the request was denied for the following reason and was not sent to me in a way that I see very frequently,  so I apologize for the delay :  Barbaraann Rondo sent to [name], MD Hi Dr. Derrel Nip,   Thank you for the referral for [facility] Going through her chart, it looks like she has had a  sleep study in 2020 and a CPAP titration study in 2021. Unfortunately, since she has had a sleep study within the last three years, insurance will not cover another study at our office. She would need to be referred back to the provider who did her previous study to discuss the inspire implant.   Please let me know if you have any further questions.    Do you recall who ordered your previous study?  Regards,   Deborra Medina, MD

## 2021-11-02 NOTE — Telephone Encounter (Signed)
LMTCB

## 2021-11-03 ENCOUNTER — Telehealth: Payer: Self-pay | Admitting: Internal Medicine

## 2021-11-03 NOTE — Telephone Encounter (Signed)
Copied from [place]. Topic: Medicare AWV >> Nov 03, 2021 10:26 AM Harris-Coley, Hannah Beat wrote: Reason for CRM: Left message for patient to schedule Annual Wellness Visit.  Please schedule with Nurse Health Advisor Denisa O'Brien-Blaney, LPN at [facility].  Please call [phone] ask for [facility]

## 2021-11-04 ENCOUNTER — Ambulatory Visit: Payer: Medicare PPO

## 2021-11-09 ENCOUNTER — Ambulatory Visit: Payer: Medicare PPO | Attending: Podiatry

## 2021-11-09 DIAGNOSIS — M25571 Pain in right ankle and joints of right foot: Secondary | ICD-10-CM | POA: Insufficient documentation

## 2021-11-09 DIAGNOSIS — R262 Difficulty in walking, not elsewhere classified: Secondary | ICD-10-CM | POA: Diagnosis not present

## 2021-11-09 DIAGNOSIS — M6281 Muscle weakness (generalized): Secondary | ICD-10-CM | POA: Diagnosis not present

## 2021-11-09 MED ORDER — FLECAINIDE ACETATE 100 MG PO TABS: 100.0000 mg | ORAL_TABLET | Freq: Two times a day (BID) | ORAL | 3 refills | Status: DC

## 2021-11-09 NOTE — Therapy (Signed)
OUTPATIENT PHYSICAL THERAPY TREATMENT NOTE     Patient Name: Kathy Long MRN: [id number] DOB:March 03, 1951, 71 y.o., female Today's Date: 11/09/2021  PCP: [name], MD REFERRING PROVIDER: Criselda Peaches, DPM   PT End of Session - 11/09/21 0932     Visit Number 17    Number of Visits 25    Date for PT Re-Evaluation 11/19/21    Authorization Type 7    Authorization Time Period of 10 progress report    PT Start Time 0932    PT Stop Time 1012    PT Time Calculation (min) 40 min    Activity Tolerance Patient tolerated treatment well    Behavior During Therapy WFL for tasks assessed/performed                       Past Medical History:  Diagnosis Date   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   Anxiety    Arthritis    lower back, hands   Cancer ([place])    melanoma, shoulder   Chronic kidney disease    Depression    GERD (gastroesophageal reflux disease)    Hyperlipidemia    Hypertension    Postoperative bile leak    Sleep apnea    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    Past Surgical History:  Procedure Laterality Date   ANKLE FRACTURE SURGERY Left    APPENDECTOMY  2010   BACK SURGERY     disectomy lumbar, scar tissue   BREAST CYST EXCISION Right 1970   axilla   buninectomy Bilateral    CATARACT EXTRACTION W/PHACO Left 04/12/2018   Procedure: CATARACT EXTRACTION PHACO AND INTRAOCULAR LENS PLACEMENT ([place])  LEFT TORIC LENS;  Surgeon: Leandrew Koyanagi, MD;  Location: [facility];  Service: Ophthalmology;  Laterality: Left;   CATARACT EXTRACTION W/PHACO Right 05/10/2018   Procedure: CATARACT EXTRACTION PHACO AND INTRAOCULAR LENS PLACEMENT ([place])  RIGHT TORIC LENS;  Surgeon: Leandrew Koyanagi, MD;  Location: [facility];  Service: Ophthalmology;  Laterality: Right;  DIABETIC   CHOLECYSTECTOMY N/A 07/05/2017   Procedure: LAPAROSCOPIC CHOLECYSTECTOMY WITH INTRAOPERATIVE CHOLANGIOGRAM;  Surgeon: Robert Bellow, MD;  Location: [facility];   Service: General;  Laterality: N/A;   COLONOSCOPY     2009 and color gaurd in 2018   COLONOSCOPY N/A 03/25/2020   Procedure: COLONOSCOPY;  Surgeon: [name], MD;  Location: [facility];  Service: Endoscopy;  Laterality: N/A;   DRUG INDUCED ENDOSCOPY N/A 05/06/2021   Procedure: DRUG INDUCED SLEEP ENDOSCOPY;  Surgeon: Melida Quitter, MD;  Location: [facility];  Service: ENT;  Laterality: N/A;   ERCP N/A 07/12/2017   Procedure: ENDOSCOPIC RETROGRADE CHOLANGIOPANCREATOGRAPHY (ERCP);  Surgeon: Lucilla Lame, MD;  Location: [facility]COPY;  Service: Endoscopy;  Laterality: N/A;   ERCP N/A 10/04/2017   Procedure: ENDOSCOPIC RETROGRADE CHOLANGIOPANCREATOGRAPHY (ERCP);  Surgeon: Lucilla Lame, MD;  Location: [facility];  Service: Endoscopy;  Laterality: N/A;   FOOT SURGERY     x 2   INCISIONAL HERNIA REPAIR N/A 12/04/2020   Procedure: VENTRAL INCISIONAL HERNIA REPAIR WITH MESH;  Surgeon: Armandina Gemma, MD;  Location: [facility];  Service: General;  Laterality: N/A;   MELANOMA EXCISION Left 2000   PALATOPLASTY N/A 04/08/2015   Procedure: PALATOPLASTY;  Surgeon: Beverly Gust, MD;  Location: [facility];  Service: ENT;  Laterality: N/A;   TONSILLECTOMY N/A 04/08/2015   Procedure: TONSILLECTOMY;  Surgeon: Beverly Gust, MD;  Location: [facility];  Service:  ENT;  Laterality: N/A;   TUBAL LIGATION     Patient Active Problem List   Diagnosis Date Noted   Paroxysmal atrial fibrillation ([place]) 10/09/2021   Aortic atherosclerosis ([place]) 05/12/2021   History of COVID-19 05/12/2021   Positive colorectal cancer screening using Cologuard test 02/05/2020   Esophageal dysphagia 12/11/2019   Hyperlipidemia LDL goal <100 12/10/2019   Right shoulder pain 10/17/2019   Arthritis of right acromioclavicular joint 08/15/2019   Piriformis syndrome of left side 08/15/2019   OSA (obstructive sleep apnea) 05/13/2019   CKD (chronic kidney disease) stage 3, GFR 30-59 ml/min (HCC) 04/13/2018   Closed  fracture of distal lateral malleolus of left ankle 12/28/2017   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   S/P laparoscopic cholecystectomy 07/19/2017   Hospital discharge follow-up 07/19/2017   Acoustic neuroma ([place]) 06/12/2017   Colon cancer screening 03/08/2017   Nocturia 12/09/2016   Major depressive disorder, 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[place]) 06/05/2016   Encounter for preventive health examination 03/07/2015   Incisional hernia, without obstruction or gangrene 09/28/2014   Obesity 09/28/2014   Anxiety state 11/19/2013   Gastro-esophageal reflux disease without esophagitis 11/19/2013   Squamous cell carcinoma of skin of face 10/21/2011   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 Has been having foot pain since May 2022. Has been in a boot or something since then like and ace bandage. Pt push a luggage rack up a [place] the [place] 2022 which bothered her foot. Had injections, had x-rays, nothing worked. Had to get surgery. Currently on elequis and metoprolol. Got an apple watch to watch her heart rate. Has Afib once every 24-36 hours. Has not yet had PT since her procedure. Also has an acoustic neuroma L ear which affects her balance, therefore she's using a wheel chair. Pt fell in the bathroom June 06, 2021. Hit her head but her R foot is fine. Head is ok. MD told her that she can weight bear with her CAM boot on Monday August 03, 2021. Start out walking gingerly to see if there is pain, then gradually increase weight for 2-3 days,  then midweek should be fine with full weight bearing.  PRECAUTIONS: atrial fibrillation  SUBJECTIVE:    Doctor increased her flecainide dose 100 mg 2x/day. The flecainide however increases the side effects of headache, dizziness, etc. R foot is feeling great. Just has a slight limp but it does not hurt.       PAIN:  Are you having pain? 0/10 anterior lateral ankle soreness.     Objective   [email or web address]    Therapeutic exercise   HR at start of session: 58-59 bpm  Standing B heel raises with B UE assist 10x3   Forward step with R LE with L UE assist   Onto Air Ex pad 10x    Then onto Dyna disc 10x3  Lateral step up onto Dyna disc with B UE assist 10x2    Rockerboard ankle PF/DF 2 minutes   Side stepping with red band around ankles 32 ft to  the R and 32 ft to the L 1x.  SLS balance with one UE light touch assist   R 10x5 seconds for 2 sets  Tandem walk CGA 10 ft forward and backward with CGA to min assist from PT 4x  Cues for placing her center of gravity over her base of support (foot). Improved balance with improved practice.  Standing min squats on Air Ex Pad without B UE assist 10x3 CGA  Standing B gastroc stretch at first stair step with B UE assist 30 seconds x 3    Improved exercise technique, movement at target joints, use of target muscles after min to mod verbal, visual, tactile cues.      Response to treatment Fair tolerance to today's session.   Clinical impression Continued working on improving R ankle and B glute strength in closed chain as well as balance and ankle stability to promote ability to ambulate over uneven surfaces safely and with less difficulty. Fair tolerance to today's session. Pt will benefit from continued skilled physical therapy services to decrease pain, improve strength and function.              PATIENT EDUCATION: Education details: Cardiac nurse instructions Person educated: Patient and  Spouse Education method: Explanation Education comprehension: verbalized understanding and returned demonstration   HOME EXERCISE PROGRAM: Access Code: [email or web address] URL: [email or web address] Date: 08/18/2021 Prepared by: Joneen Boers  Exercises Supine Ankle Pumps - 5 x daily - 7 x weekly - 3 sets - 10 reps Supine Active Straight Leg Raise - 1 x daily - 7 x weekly - 3 sets - 10 reps Sidelying Hip Abduction - 1 x daily - 7 x weekly - 3 sets - 5 reps Supine Posterior Pelvic Tilt - 1 x daily - 7 x weekly - 3 sets - 10 reps - 5 seconds hold - Seated Gastroc Stretch with Strap  - 3 x daily - 7 x weekly - 1 sets - 3 reps - 30 seconds hold - Standing Gastroc Stretch at Counter  - 2 x daily - 7 x weekly - 1 sets - 3 reps - 30 seconds hold - Seated Ankle Plantar Flexion with Resistance Loop  - 1 x daily - 7 x weekly - 2-3 sets - 10 reps - 5 second hold       PT Short Term Goals      PT SHORT TERM GOAL #1   Title Pt will be independent with her initial HEP to improve ankle AROM, R LE strength, and ability to ambulate with less difficulty.    Baseline Pt has started her HEP (07/29/2021); Doing her HEP, seems to help, no questions. (09/28/2021)   Time 3    Period Weeks    Status Achieved.    Target Date 08/20/21              PT Long Term Goals       PT LONG TERM GOAL #1   Title Pt will improve R ankle DF AROM to 15 degrees and PF to 50 degrees to promote ability to ambulate with less difficulty.    Baseline R ankle DF 5 degrees, PF 32 degrees (07/29/2021); DF 8 degrees, PF 45 degrees (09/28/2021)   Time 12    Period Weeks    Status Partially met   Target Date 11/19/21      PT LONG TERM GOAL #2   Title Pt will improve R LE strength by at least 1/2 MMT grade to promote ability to perform  standing tasks and ambulate with less difficulty.    Baseline R hip flexion 4/5, extension 3+/5, abduction 4-/5, knee flexion and extension 4+/5. Ankle strength not yet tested (07/29/2021);  hip flexion 4+/5, knee flexion 5/5, knee extension 5/5, ankle DF 4+/5, PF 5/5 (manually resisted), hip extension 4/5, hip abduction 4+/5 (09/28/2021)   Time 12    Period Weeks    Status Achieved.    Target Date 10/22/21      PT LONG TERM GOAL #3   Title Pt will be able to ambulate at least 500 ft with regular shoe and no AD to promote mobility.    Baseline RW with R CAM boot, R LE NWB, 5 ft (07/29/2021); able to ambulate with normal shoe, no ankle brace, R heel discomfort (09/28/2021)   Time 12    Period Weeks    Status Achieved.    Target Date 10/22/21      PT LONG TERM GOAL #4   Title Pt will improve her R foot FOTO score by at least 15 points as a demonstration of improved function.    Baseline R foot FOTO emailed to pt (07/29/2021); initial FOTO for 07/29/2021 not completed. Initial FOTO 54 dated today 09/28/2021; 65 (10/20/2021)   Time 12    Period Weeks    Status Achieved   Target Date 10/22/21      PT LONG TERM GOAL #5    Title Pt will improve her DGI score to > 19 as a demonnstration of improved balance and decreased fall risk.     Baseline DGI 16 (10/20/2021)     Time 4     Period Weeks     Status New     Target Date 11/19/21             Plan     Clinical Impression Statement Continued working on improving R ankle and B glute strength in closed chain as well as balance and ankle stability to promote ability to ambulate over uneven surfaces safely and with less difficulty. Fair tolerance to today's session. Pt will benefit from continued skilled physical therapy services to decrease pain, improve strength and function.      Personal Factors and Comorbidities Age;Comorbidity 3+;Fitness;Time since onset of injury/illness/exacerbation    Comorbidities Acoustic neuroma L ear, anxiety, CA, depression, chronic kidney disease, HTN    Examination-Activity Limitations Bathing;Locomotion Level;Transfers;Carry;Squat;Stairs;Lift    Stability/Clinical Decision Making Evolving/Moderate  complexity   Afib 08/18/2021   Clinical Decision Making Moderate    Rehab Potential Fair    PT Frequency 2x / week    PT Duration 4 weeks    PT Treatment/Interventions Neuromuscular re-education;Gait training;Stair training;Functional mobility training;Therapeutic activities;Therapeutic exercise;Balance training;Patient/family education;Manual techniques;Scar mobilization;Dry needling;Vestibular;Electrical Stimulation;Canalith Repostioning;Iontophoresis 95m/ml Dexamethasone    PT Next Visit Plan Ankle ROM (DF, PF), hip and knee strengthening, gait, manual techniques, modalities PRN    PT Home Exercise Plan [place] Agree with Plan of Care Patient               MJoneen BoersPT, DPT  11/09/2021, 10:20 AM

## 2021-11-11 ENCOUNTER — Encounter: Payer: Self-pay | Admitting: Cardiovascular Disease

## 2021-11-11 NOTE — Telephone Encounter (Signed)
Placed in quick sign folder.  

## 2021-11-12 ENCOUNTER — Ambulatory Visit: Payer: Medicare PPO

## 2021-11-12 ENCOUNTER — Other Ambulatory Visit: Payer: Self-pay | Admitting: Unknown Physician Specialty

## 2021-11-12 DIAGNOSIS — M25571 Pain in right ankle and joints of right foot: Secondary | ICD-10-CM

## 2021-11-12 DIAGNOSIS — M6281 Muscle weakness (generalized): Secondary | ICD-10-CM | POA: Diagnosis not present

## 2021-11-12 DIAGNOSIS — R262 Difficulty in walking, not elsewhere classified: Secondary | ICD-10-CM | POA: Diagnosis not present

## 2021-11-12 DIAGNOSIS — D333 Benign neoplasm of cranial nerves: Secondary | ICD-10-CM

## 2021-11-12 NOTE — Therapy (Signed)
OUTPATIENT PHYSICAL THERAPY TREATMENT NOTE     Patient Name: Kathy Long MRN: [id number] DOB:Jan 13, 1951, 71 y.o., female Today's Date: 11/12/2021  PCP: [name], MD REFERRING PROVIDER: Criselda Peaches, DPM   PT End of Session - 11/12/21 1147     Visit Number 18    Number of Visits 25    Date for PT Re-Evaluation 11/19/21    Authorization Type 8    Authorization Time Period of 10 progress report    PT Start Time 1147    PT Stop Time 1231    PT Time Calculation (min) 44 min    Activity Tolerance Patient tolerated treatment well    Behavior During Therapy WFL for tasks assessed/performed                        Past Medical History:  Diagnosis Date   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   Anxiety    Arthritis    lower back, hands   Cancer ([place])    melanoma, shoulder   Chronic kidney disease    Depression    GERD (gastroesophageal reflux disease)    Hyperlipidemia    Hypertension    Postoperative bile leak    Sleep apnea    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    Past Surgical History:  Procedure Laterality Date   ANKLE FRACTURE SURGERY Left    APPENDECTOMY  2010   BACK SURGERY     disectomy lumbar, scar tissue   BREAST CYST EXCISION Right 1970   axilla   buninectomy Bilateral    CATARACT EXTRACTION W/PHACO Left 04/12/2018   Procedure: CATARACT EXTRACTION PHACO AND INTRAOCULAR LENS PLACEMENT ([place])  LEFT TORIC LENS;  Surgeon: Leandrew Koyanagi, MD;  Location: [facility];  Service: Ophthalmology;  Laterality: Left;   CATARACT EXTRACTION W/PHACO Right 05/10/2018   Procedure: CATARACT EXTRACTION PHACO AND INTRAOCULAR LENS PLACEMENT ([place])  RIGHT TORIC LENS;  Surgeon: Leandrew Koyanagi, MD;  Location: [facility];  Service: Ophthalmology;  Laterality: Right;  DIABETIC   CHOLECYSTECTOMY N/A 07/05/2017   Procedure: LAPAROSCOPIC CHOLECYSTECTOMY WITH INTRAOPERATIVE CHOLANGIOGRAM;  Surgeon: Robert Bellow, MD;  Location: [facility];  Service: General;  Laterality: N/A;   COLONOSCOPY     2009 and color gaurd in 2018   COLONOSCOPY N/A 03/25/2020   Procedure: COLONOSCOPY;  Surgeon: [name], MD;  Location: [facility] ENDOSCOPY;  Service: Endoscopy;  Laterality: N/A;   DRUG INDUCED ENDOSCOPY N/A 05/06/2021   Procedure: DRUG INDUCED SLEEP ENDOSCOPY;  Surgeon: Melida Quitter, MD;  Location: [facility];  Service: ENT;  Laterality: N/A;   ERCP N/A 07/12/2017   Procedure: ENDOSCOPIC RETROGRADE CHOLANGIOPANCREATOGRAPHY (ERCP);  Surgeon: Lucilla Lame, MD;  Location: [facility];  Service: Endoscopy;  Laterality: N/A;   ERCP N/A 10/04/2017   Procedure: ENDOSCOPIC RETROGRADE CHOLANGIOPANCREATOGRAPHY (ERCP);  Surgeon: Lucilla Lame, MD;  Location: [facility]OSCOPY;  Service: Endoscopy;  Laterality: N/A;   FOOT SURGERY     x 2   INCISIONAL HERNIA REPAIR N/A 12/04/2020   Procedure: VENTRAL INCISIONAL HERNIA REPAIR WITH MESH;  Surgeon: Armandina Gemma, MD;  Location: [facility];  Service: General;  Laterality: N/A;   MELANOMA EXCISION Left 2000   PALATOPLASTY N/A 04/08/2015   Procedure: PALATOPLASTY;  Surgeon: Beverly Gust, MD;  Location: [facility] ORS;  Service: ENT;  Laterality: N/A;   TONSILLECTOMY N/A 04/08/2015   Procedure: TONSILLECTOMY;  Surgeon: Beverly Gust, MD;  Location: [facility] ORS;  Service: ENT;  Laterality: N/A;   TUBAL LIGATION     Patient Active Problem List   Diagnosis Date Noted   Paroxysmal atrial fibrillation ([place]) 10/09/2021   Aortic atherosclerosis ([place]) 05/12/2021   History of COVID-19 05/12/2021   Positive colorectal cancer screening using Cologuard test 02/05/2020   Esophageal dysphagia 12/11/2019   Hyperlipidemia LDL goal <100 12/10/2019   Right shoulder pain 10/17/2019   Arthritis of right acromioclavicular joint 08/15/2019   Piriformis syndrome of left side 08/15/2019   OSA (obstructive sleep apnea) 05/13/2019   CKD (chronic kidney disease) stage 3, GFR 30-59 ml/min (H[facility]) 04/13/2018    Closed fracture of distal lateral malleolus of left ankle 12/28/2017   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   S/P laparoscopic cholecystectomy 07/19/2017   Hospital discharge follow-up 07/19/2017   Acoustic neuroma ([place]) 06/12/2017   Colon cancer screening 03/08/2017   Nocturia 12/09/2016   Major depressive disorder, 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[place]) 06/05/2016   Encounter for preventive health examination 03/07/2015   Incisional hernia, without obstruction or gangrene 09/28/2014   Obesity 09/28/2014   Anxiety state 11/19/2013   Gastro-esophageal reflux disease without esophagitis 11/19/2013   Squamous cell carcinoma of skin of face 10/21/2011   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 Has been having foot pain since May 2022. Has been in a boot or something since then like and ace bandage. Pt push a luggage rack [place] which bothered her foot. Had injections, had x-rays, nothing worked. Had to get surgery. Currently on elequis and metoprolol. Got an apple watch to watch her heart rate. Has Afib once every 24-36 hours. Has not yet had PT since her procedure. Also has an acoustic neuroma L ear which affects her balance, therefore she's using a wheel chair. Pt fell in the bathroom June 06, 2021. Hit her head but her R foot is fine. Head is ok. MD told her that she can weight bear with her CAM boot on Monday August 03, 2021. Start out walking gingerly to see if there is pain, then gradually increase weight for 2-3  days, then midweek should be fine with full weight bearing.  PRECAUTIONS: atrial fibrillation  SUBJECTIVE:    Woke up Tuesday morning hallucinating after taking the flecainide. Sent a message to her doctor about it via mychart. Has had hallucination with oxycodone before. Has a little tightness R anterior medial ankle about a 1/10 pain currently   PAIN:  Are you having pain? 1/10 anterior medial ankle soreness.     Objective   [email or web address]    Therapeutic exercise   HR at start of session: 55-56 bpm  Recommended pt to actually call her MD office in case he did not see the message.   Time taken to listen to pt subjective.   Standing B gastroc stretch at first stair step 30 seconds x 3  Rockerboard ankle PF/DF 2 minutes   Seated ankle alphabets A - Z upper case letters   Standing B heel raises with B UE assist  10x3   Side stepping with red band around ankles 32 ft to the R and 32 ft to the L 1x.   Forward step with R LE with L UE assist   onto Dyna disc 10x3  Lateral step up onto Dyna disc with B UE assist 10x2  Demonstrates tendency for R ankle IV     Seated R ankle EV 10x5 seconds for 3 sets at available range to promote ankle stability   SLS balance with one UE light touch assist   R 10x5 seconds for 2 sets    Improved exercise technique, movement at target joints, use of target muscles after min to mod verbal, visual, tactile cues.      Response to treatment Pt tolerated session well without aggravation of symptoms.    Clinical impression  Worked on ankle ROM and stability to help decrease anterior medial pressure as well as improve R LE balance. Pt tolerated session well withotu aggravation of symptoms. Pt will benefit from continued skilled physical therapy services to decrease pain, improve strength and function.              PATIENT EDUCATION: Education details: Cardiac nurse instructions Person educated: Patient and  Spouse Education method: Explanation Education comprehension: verbalized understanding and returned demonstration   HOME EXERCISE PROGRAM: Access Code: [email or web address] URL: [email or web address] Date: 08/18/2021 Prepared by: Joneen Boers  Exercises Supine Ankle Pumps - 5 x daily - 7 x weekly - 3 sets - 10 reps Supine Active Straight Leg Raise - 1 x daily - 7 x weekly - 3 sets - 10 reps Sidelying Hip Abduction - 1 x daily - 7 x weekly - 3 sets - 5 reps Supine Posterior Pelvic Tilt - 1 x daily - 7 x weekly - 3 sets - 10 reps - 5 seconds hold - Seated Gastroc Stretch with Strap  - 3 x daily - 7 x weekly - 1 sets - 3 reps - 30 seconds hold - Standing Gastroc Stretch at Counter  - 2 x daily - 7 x weekly - 1 sets - 3 reps - 30 seconds hold - Seated Ankle Plantar Flexion with Resistance Loop  - 1 x daily - 7 x weekly - 2-3 sets - 10 reps - 5 second hold       PT Short Term Goals      PT SHORT TERM GOAL #1   Title Pt will be independent with her initial HEP to improve ankle AROM, R LE strength, and ability to ambulate with less difficulty.    Baseline Pt has started her HEP (07/29/2021); Doing her HEP, seems to help, no questions. (09/28/2021)   Time 3    Period Weeks    Status Achieved.    Target Date 08/20/21              PT Long Term Goals       PT LONG TERM GOAL #1   Title Pt will improve R ankle DF AROM to 15 degrees and PF to 50 degrees to promote ability to ambulate with less difficulty.    Baseline R ankle DF 5 degrees, PF 32 degrees (07/29/2021); DF 8 degrees, PF 45 degrees (09/28/2021)   Time 12    Period Weeks    Status Partially met   Target Date 11/19/21      PT LONG TERM GOAL #2   Title Pt will improve R LE strength by at least 1/2 MMT grade to promote ability to perform standing tasks and  ambulate with less difficulty.    Baseline R hip flexion 4/5, extension 3+/5, abduction 4-/5, knee flexion and extension 4+/5. Ankle strength not yet tested (07/29/2021);  hip flexion 4+/5, knee flexion 5/5, knee extension 5/5, ankle DF 4+/5, PF 5/5 (manually resisted), hip extension 4/5, hip abduction 4+/5 (09/28/2021)   Time 12    Period Weeks    Status Achieved.    Target Date 10/22/21      PT LONG TERM GOAL #3   Title Pt will be able to ambulate at least 500 ft with regular shoe and no AD to promote mobility.    Baseline RW with R CAM boot, R LE NWB, 5 ft (07/29/2021); able to ambulate with normal shoe, no ankle brace, R heel discomfort (09/28/2021)   Time 12    Period Weeks    Status Achieved.    Target Date 10/22/21      PT LONG TERM GOAL #4   Title Pt will improve her R foot FOTO score by at least 15 points as a demonstration of improved function.    Baseline R foot FOTO emailed to pt (07/29/2021); initial FOTO for 07/29/2021 not completed. Initial FOTO 54 dated today 09/28/2021; 65 (10/20/2021)   Time 12    Period Weeks    Status Achieved   Target Date 10/22/21      PT LONG TERM GOAL #5    Title Pt will improve her DGI score to > 19 as a demonnstration of improved balance and decreased fall risk.     Baseline DGI 16 (10/20/2021)     Time 4     Period Weeks     Status New     Target Date 11/19/21             Plan     Clinical Impression Statement Worked on ankle ROM and stability to help decrease anterior medial pressure as well as improve R LE balance. Pt tolerated session well withotu aggravation of symptoms. Pt will benefit from continued skilled physical therapy services to decrease pain, improve strength and function.     Personal Factors and Comorbidities Age;Comorbidity 3+;Fitness;Time since onset of injury/illness/exacerbation    Comorbidities Acoustic neuroma L ear, anxiety, CA, depression, chronic kidney disease, HTN    Examination-Activity Limitations Bathing;Locomotion Level;Transfers;Carry;Squat;Stairs;Lift    Stability/Clinical Decision Making Evolving/Moderate complexity   Afib 08/18/2021   Clinical Decision Making Moderate    Rehab  Potential Fair    PT Frequency 2x / week    PT Duration 4 weeks    PT Treatment/Interventions Neuromuscular re-education;Gait training;Stair training;Functional mobility training;Therapeutic activities;Therapeutic exercise;Balance training;Patient/family education;Manual techniques;Scar mobilization;Dry needling;Vestibular;Electrical Stimulation;Canalith Repostioning;Iontophoresis 41m/ml Dexamethasone    PT Next Visit Plan Ankle ROM (DF, PF), hip and knee strengthening, gait, manual techniques, modalities PRN    PT Home Exercise [place]gree with Plan of Care Patient               MJoneen BoersPT, DPT  11/12/2021, 12:57 PM

## 2021-11-16 ENCOUNTER — Ambulatory Visit: Payer: Medicare PPO

## 2021-11-16 ENCOUNTER — Telehealth: Payer: Self-pay | Admitting: Cardiovascular Disease

## 2021-11-16 DIAGNOSIS — M6281 Muscle weakness (generalized): Secondary | ICD-10-CM | POA: Diagnosis not present

## 2021-11-16 DIAGNOSIS — I480 Paroxysmal atrial fibrillation: Secondary | ICD-10-CM

## 2021-11-16 DIAGNOSIS — M25571 Pain in right ankle and joints of right foot: Secondary | ICD-10-CM | POA: Diagnosis not present

## 2021-11-16 DIAGNOSIS — R262 Difficulty in walking, not elsewhere classified: Secondary | ICD-10-CM

## 2021-11-16 NOTE — Telephone Encounter (Signed)
The patient was in the office today for a GXT for a flecainide start.   Per the patient, she has been having intermittent episodes of atrial fibrillation. Her last episode was yesterday morning and lasted for several hours with HR's in the 120's.  The patient was initially started on flecainide on a PRN basis, then increased to 50 mg BID due to more frequent episodes of a-fib. She was eventually increased to flecainide 100 mg BID, but was having hallucinations at this dose so dosing was decreased back to 50 mg BID.  The patient advised that she is having symptoms of headaches and sleep disturbance since starting flecainide.   BP's were elevated upon arrival for her GXT today>>  - left arm: 169/105 & 176/102 - right arm: 182/105 & 192/110  After 30 minutes, the patient's BP was 154/98>> proceeded with GXT testing for flecainide dosing. BP during exercise was 139/79.  The patient did experience some dyspnea while on the treadmill at peak exercise.   I spoke with Dr. Rockey Situ while the patient was here for her GXT>> pending results of her GXT, may increase flecainide to 75 mg BID, but will need to take into consideration that she is having headaches and sleep disturbance that she attributes to the initiation of flecainide.   The patient was advised she will receive a call back with further recommendations once her GXT is reviewed. She voices understanding and is agreeable.

## 2021-11-16 NOTE — Therapy (Signed)
OUTPATIENT PHYSICAL THERAPY TREATMENT NOTE     Patient Name: Kathy Long MRN: [id number] DOB:11/24/1950, 71 y.o., female Today's Date: 11/16/2021  PCP: [name], MD REFERRING PROVIDER: Criselda Peaches, DPM   PT End of Session - 11/16/21 1148     Visit Number 19    Number of Visits 25    Date for PT Re-Evaluation 11/19/21    Authorization Type 9    Authorization Time Period of 10 progress report    PT Start Time 1148    PT Stop Time 1229    PT Time Calculation (min) 41 min    Activity Tolerance Patient tolerated treatment well    Behavior During Therapy WFL for tasks assessed/performed                         Past Medical History:  Diagnosis Date   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   Anxiety    Arthritis    lower back, hands   Cancer ([place])    melanoma, shoulder   Chronic kidney disease    Depression    GERD (gastroesophageal reflux disease)    Hyperlipidemia    Hypertension    Postoperative bile leak    Sleep apnea    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    Past Surgical History:  Procedure Laterality Date   ANKLE FRACTURE SURGERY Left    APPENDECTOMY  2010   BACK SURGERY     disectomy lumbar, scar tissue   BREAST CYST EXCISION Right 1970   axilla   buninectomy Bilateral    CATARACT EXTRACTION W/PHACO Left 04/12/2018   Procedure: CATARACT EXTRACTION PHACO AND INTRAOCULAR LENS PLACEMENT ([place])  LEFT TORIC LENS;  Surgeon: Leandrew Koyanagi, MD;  Location: [place];  Service: Ophthalmology;  Laterality: Left;   CATARACT EXTRACTION W/PHACO Right 05/10/2018   Procedure: CATARACT EXTRACTION PHACO AND INTRAOCULAR LENS PLACEMENT ([place])  RIGHT TORIC LENS;  Surgeon: Leandrew Koyanagi, MD;  Location: [place];  Service: Ophthalmology;  Laterality: Right;  DIABETIC   CHOLECYSTECTOMY N/A 07/05/2017   Procedure: LAPAROSCOPIC CHOLECYSTECTOMY WITH INTRAOPERATIVE CHOLANGIOGRAM;  Surgeon: Robert Bellow, MD;  Location: [facility]  [facility];  Service: General;  Laterality: N/A;   COLONOSCOPY     2009 and color gaurd in 2018   COLONOSCOPY N/A 03/25/2020   Procedure: COLONOSCOPY;  Surgeon: [name], MD;  Location: [facility] ENDOSCOPY;  Service: Endoscopy;  Laterality: N/A;   DRUG INDUCED ENDOSCOPY N/A 05/06/2021   Procedure: DRUG INDUCED SLEEP ENDOSCOPY;  Surgeon: Melida Quitter, MD;  Location: [place];  Service: ENT;  Laterality: N/A;   ERCP N/A 07/12/2017   Procedure: ENDOSCOPIC RETROGRADE CHOLANGIOPANCREATOGRAPHY (ERCP);  Surgeon: Lucilla Lame, MD;  Location: [facility];  Service: Endoscopy;  Laterality: N/A;   ERCP N/A 10/04/2017   Procedure: ENDOSCOPIC RETROGRADE CHOLANGIOPANCREATOGRAPHY (ERCP);  Surgeon: Lucilla Lame, MD;  Location: [facility] ENDOSCOPY;  Service: Endoscopy;  Laterality: N/A;   FOOT SURGERY     x 2   INCISIONAL HERNIA REPAIR N/A 12/04/2020   Procedure: VENTRAL INCISIONAL HERNIA REPAIR WITH MESH;  Surgeon: Armandina Gemma, MD;  Location: [facility];  Service: General;  Laterality: N/A;   MELANOMA EXCISION Left 2000   PALATOPLASTY N/A 04/08/2015   Procedure: PALATOPLASTY;  Surgeon: Beverly Gust, MD;  Location: [facility] ORS;  Service: ENT;  Laterality: N/A;   TONSILLECTOMY N/A 04/08/2015   Procedure: TONSILLECTOMY;  Surgeon: Beverly Gust, MD;  Location: [facility] ORS;  Service: ENT;  Laterality: N/A;   TUBAL LIGATION     Patient Active Problem List   Diagnosis Date Noted   Paroxysmal atrial fibrillation ([place]) 10/09/2021   Aortic atherosclerosis ([place]) 05/12/2021   History of COVID-19 05/12/2021   Positive colorectal cancer screening using Cologuard test 02/05/2020   Esophageal dysphagia 12/11/2019   Hyperlipidemia LDL goal <100 12/10/2019   Right shoulder pain 10/17/2019   Arthritis of right acromioclavicular joint 08/15/2019   Piriformis syndrome of left side 08/15/2019   OSA (obstructive sleep apnea) 05/13/2019   CKD (chronic kidney disease) stage 3, GFR 30-59 ml/min (HCC) 04/13/2018    Closed fracture of distal lateral malleolus of left ankle 12/28/2017   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   S/P laparoscopic cholecystectomy 07/19/2017   Hospital discharge follow-up 07/19/2017   Acoustic neuroma ([place]) 06/12/2017   Colon cancer screening 03/08/2017   Nocturia 12/09/2016   Major depressive disorder, 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[place]) 06/05/2016   Encounter for preventive health examination 03/07/2015   Incisional hernia, without obstruction or gangrene 09/28/2014   Obesity 09/28/2014   Anxiety state 11/19/2013   Gastro-esophageal reflux disease without esophagitis 11/19/2013   Squamous cell carcinoma of skin of face 10/21/2011   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 Has been having foot pain since May 2022. Has been in a boot or something since then like and ace bandage. Pt push [place] luggage rack [place] which bothered her foot. Had injections, had x-rays, nothing worked. Had to get surgery. Currently on elequis and metoprolol. Got an apple watch to watch her heart rate. Has Afib once every 24-36 hours. Has not yet had PT since her procedure. Also has an acoustic neuroma L ear which affects her balance, therefore she's using a wheel chair. Pt fell in the bathroom June 06, 2021. Hit her head but her R foot is fine. Head is ok. MD told her that she can weight bear with her CAM boot on Monday August 03, 2021. Start out walking gingerly to see if there is pain, then gradually increase weight for 2-3  days, then midweek should be fine with full weight bearing.  PRECAUTIONS: atrial fibrillation  SUBJECTIVE:    Still has a limp but not due to pain.    PAIN:  Are you having pain? 1/10 A lateral ankle soreness.     Objective   [email or web address]    Therapeutic exercise   HR at start of session: 59-62 bpm  Standing lunges with contralateral UE assist   R 10x3  L 10x3  Standing B heel raises with B UE assist 10x3  SLS balance with one UE light touch assist   R 10x5 seconds, then L UE assist PRN 10x5 seconds for 2 sets  Seated R ankle EV 10x5 seconds for 3 sets at available range to promote ankle stability  Forward step up onto air ex pad with one UE assist PRN   R LE 10x  Dizziness during turns. Room does not look like it is spinning.  Standing B gastroc stretch at first stair step 30 seconds x 3  Tandem walk CGA 10 ft forward and backward with CGA to min assist from PT 4x                Cues for placing her center of gravity over her base of support (foot). Improved balance with improved practice.  Rockerboard ankle PF/DF 2 minutes     Improved exercise technique, movement at target joints, use of target muscles after min to mod verbal, visual, tactile cues.       Response to treatment Pt tolerated session well without aggravation of symptoms.    Clinical impression  Continued working on R ankle stability and strength to improve balance and ability to ambulate with less difficulty.  Pt tolerated session well withotu aggravation of symptoms. Pt will benefit from continued skilled physical therapy services to decrease pain, improve strength and function.              PATIENT EDUCATION: Education details: Cardiac nurse instructions Person educated: Patient and Spouse Education method: Explanation Education comprehension: verbalized understanding and returned demonstration   HOME EXERCISE PROGRAM: Access Code: [email or web address] URL:  [email or web address] Date: 08/18/2021 Prepared by: Joneen Boers  Exercises Supine Ankle Pumps - 5 x daily - 7 x weekly - 3 sets - 10 reps Supine Active Straight Leg Raise - 1 x daily - 7 x weekly - 3 sets - 10 reps Sidelying Hip Abduction - 1 x daily - 7 x weekly - 3 sets - 5 reps Supine Posterior Pelvic Tilt - 1 x daily - 7 x weekly - 3 sets - 10 reps - 5 seconds hold - Seated Gastroc Stretch with Strap  - 3 x daily - 7 x weekly - 1 sets - 3 reps - 30 seconds hold - Standing Gastroc Stretch at Counter  - 2 x daily - 7 x weekly - 1 sets - 3 reps - 30 seconds hold - Seated Ankle Plantar Flexion with Resistance Loop  - 1 x daily - 7 x weekly - 2-3 sets - 10 reps - 5 second hold       PT Short Term Goals      PT SHORT TERM GOAL #1   Title Pt will be independent with her initial HEP to improve ankle AROM, R LE strength, and ability to ambulate with less difficulty.    Baseline Pt has started her HEP (07/29/2021); Doing her HEP, seems to help, no questions. (09/28/2021)   Time 3    Period Weeks    Status Achieved.    Target Date 08/20/21              PT Long Term Goals       PT LONG TERM GOAL #1   Title Pt will improve R ankle DF AROM to 15 degrees and PF to 50 degrees to promote ability to ambulate with less difficulty.    Baseline R ankle DF 5 degrees, PF 32 degrees (07/29/2021); DF 8 degrees, PF 45 degrees (09/28/2021)   Time 12    Period Weeks    Status Partially met   Target Date 11/19/21      PT LONG TERM GOAL #2   Title Pt will improve R LE strength by at least 1/2 MMT grade to promote ability to perform standing tasks and ambulate with less difficulty.    Baseline R hip flexion 4/5, extension 3+/5, abduction 4-/5, knee flexion and extension 4+/5. Ankle strength not yet tested (  07/29/2021); hip flexion 4+/5, knee flexion 5/5, knee extension 5/5, ankle DF 4+/5, PF 5/5 (manually resisted), hip extension 4/5, hip abduction 4+/5 (09/28/2021)   Time 12    Period Weeks     Status Achieved.    Target Date 10/22/21      PT LONG TERM GOAL #3   Title Pt will be able to ambulate at least 500 ft with regular shoe and no AD to promote mobility.    Baseline RW with R CAM boot, R LE NWB, 5 ft (07/29/2021); able to ambulate with normal shoe, no ankle brace, R heel discomfort (09/28/2021)   Time 12    Period Weeks    Status Achieved.    Target Date 10/22/21      PT LONG TERM GOAL #4   Title Pt will improve her R foot FOTO score by at least 15 points as a demonstration of improved function.    Baseline R foot FOTO emailed to pt (07/29/2021); initial FOTO for 07/29/2021 not completed. Initial FOTO 54 dated today 09/28/2021; 65 (10/20/2021)   Time 12    Period Weeks    Status Achieved   Target Date 10/22/21      PT LONG TERM GOAL #5    Title Pt will improve her DGI score to > 19 as a demonnstration of improved balance and decreased fall risk.     Baseline DGI 16 (10/20/2021)     Time 4     Period Weeks     Status New     Target Date 11/19/21             Plan     Clinical Impression Statement Continued working on R ankle stability and strength to improve balance and ability to ambulate with less difficulty.  Pt tolerated session well withotu aggravation of symptoms. Pt will benefit from continued skilled physical therapy services to decrease pain, improve strength and function.     Personal Factors and Comorbidities Age;Comorbidity 3+;Fitness;Time since onset of injury/illness/exacerbation    Comorbidities Acoustic neuroma L ear, anxiety, CA, depression, chronic kidney disease, HTN    Examination-Activity Limitations Bathing;Locomotion Level;Transfers;Carry;Squat;Stairs;Lift    Stability/Clinical Decision Making Evolving/Moderate complexity   Afib 08/18/2021   Clinical Decision Making Moderate    Rehab Potential Fair    PT Frequency 2x / week    PT Duration 4 weeks    PT Treatment/Interventions Neuromuscular re-education;Gait training;Stair training;Functional  mobility training;Therapeutic activities;Therapeutic exercise;Balance training;Patient/family education;Manual techniques;Scar mobilization;Dry needling;Vestibular;Electrical Stimulation;Canalith Repostioning;Iontophoresis 37m/ml Dexamethasone    PT Next Visit Plan Ankle ROM (DF, PF), hip and knee strengthening, gait, manual techniques, modalities PRN    PT Home Exercise Plan [place] Agree with Plan of Care Patient               MJoneen BoersPT, DPT  11/16/2021, 12:45 PM

## 2021-11-17 DIAGNOSIS — H903 Sensorineural hearing loss, bilateral: Secondary | ICD-10-CM | POA: Diagnosis not present

## 2021-11-17 DIAGNOSIS — H90A22 Sensorineural hearing loss, unilateral, left ear, with restricted hearing on the contralateral side: Secondary | ICD-10-CM | POA: Diagnosis not present

## 2021-11-18 ENCOUNTER — Ambulatory Visit: Payer: Medicare PPO

## 2021-11-18 DIAGNOSIS — M25571 Pain in right ankle and joints of right foot: Secondary | ICD-10-CM

## 2021-11-18 DIAGNOSIS — R262 Difficulty in walking, not elsewhere classified: Secondary | ICD-10-CM | POA: Diagnosis not present

## 2021-11-18 DIAGNOSIS — M6281 Muscle weakness (generalized): Secondary | ICD-10-CM | POA: Diagnosis not present

## 2021-11-18 NOTE — Therapy (Signed)
OUTPATIENT PHYSICAL THERAPY TREATMENT NOTE And Progress Report (09/28/2021 - 11/18/2021)      Patient Name: Kathy Long MRN: [id number] DOB:1951-01-01, 71 y.o., female Today's Date: 11/18/2021  PCP: [name], MD REFERRING PROVIDER: Criselda Peaches, DPM   PT End of Session - 11/18/21 1106     Visit Number 20    Number of Visits 33    Date for PT Re-Evaluation 12/17/21    Authorization Type 10    Authorization Time Period of 10 progress report    PT Start Time 1104    PT Stop Time 1146    PT Time Calculation (min) 42 min    Activity Tolerance Patient tolerated treatment well    Behavior During Therapy WFL for tasks assessed/performed             Past Medical History:  Diagnosis Date   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   Anxiety    Arthritis    lower back, hands   Cancer ([place])    melanoma, shoulder   Chronic kidney disease    Depression    GERD (gastroesophageal reflux disease)    Hyperlipidemia    Hypertension    Postoperative bile leak    Sleep apnea    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    Past Surgical History:  Procedure Laterality Date   ANKLE FRACTURE SURGERY Left    APPENDECTOMY  2010   BACK SURGERY     disectomy lumbar, scar tissue   BREAST CYST EXCISION Right 1970   axilla   buninectomy Bilateral    CATARACT EXTRACTION W/PHACO Left 04/12/2018   Procedure: CATARACT EXTRACTION PHACO AND INTRAOCULAR LENS PLACEMENT ([place])  LEFT TORIC LENS;  Surgeon: Leandrew Koyanagi, MD;  Location: [place];  Service: Ophthalmology;  Laterality: Left;   CATARACT EXTRACTION W/PHACO Right 05/10/2018   Procedure: CATARACT EXTRACTION PHACO AND INTRAOCULAR LENS PLACEMENT ([place])  RIGHT TORIC LENS;  Surgeon: Leandrew Koyanagi, MD;  Location: [place];  Service: Ophthalmology;  Laterality: Right;  DIABETIC   CHOLECYSTECTOMY N/A 07/05/2017   Procedure: LAPAROSCOPIC CHOLECYSTECTOMY WITH INTRAOPERATIVE CHOLANGIOGRAM;  Surgeon: Robert Bellow, MD;  Location: [facility];  Service: General;  Laterality: N/A;   COLONOSCOPY     2009 and color gaurd in 2018   COLONOSCOPY N/A 03/25/2020   Procedure: COLONOSCOPY;  Surgeon: [name], MD;  Location: [facility];  Service: Endoscopy;  Laterality: N/A;   DRUG INDUCED ENDOSCOPY N/A 05/06/2021   Procedure: DRUG INDUCED SLEEP ENDOSCOPY;  Surgeon: Melida Quitter, MD;  Location: [place];  Service: ENT;  Laterality: N/A;   ERCP N/A 07/12/2017   Procedure: ENDOSCOPIC RETROGRADE CHOLANGIOPANCREATOGRAPHY (ERCP);  Surgeon: Lucilla Lame, MD;  Location[facility];  Service: Endoscopy;  Laterality: N/A;   ERCP N/A 10/04/2017   Procedure: ENDOSCOPIC RETROGRADE CHOLANGIOPANCREATOGRAPHY (ERCP);  Surgeon: Lucilla Lame, MD;  Location: [facility];  Service: Endoscopy;  Laterality: N/A;   FOOT SURGERY     x 2   INCISIONAL HERNIA REPAIR N/A 12/04/2020   Procedure: VENTRAL INCISIONAL HERNIA REPAIR WITH MESH;  Surgeon: Armandina Gemma, MD;  Location: [facility];  Service: General;  Laterality: N/A;   MELANOMA EXCISION Left 2000   PALATOPLASTY N/A 04/08/2015   Procedure: PALATOPLASTY;  Surgeon: Beverly Gust, MD;  Location: [facility];  Service: ENT;  Laterality: N/A;   TONSILLECTOMY N/A 04/08/2015   Procedure: TONSILLECTOMY;  Surgeon: Beverly Gust, MD;  Location: [facility];  Service: ENT;  Laterality:  N/A;   TUBAL LIGATION     Patient Active Problem List   Diagnosis Date Noted   Paroxysmal atrial fibrillation ([place]) 10/09/2021   Aortic atherosclerosis ([employer]) 05/12/2021   History of COVID-19 05/12/2021   Positive colorectal cancer screening using Cologuard test 02/05/2020   Esophageal dysphagia 12/11/2019   Hyperlipidemia LDL goal <100 12/10/2019   Right shoulder pain 10/17/2019   Arthritis of right acromioclavicular joint 08/15/2019   Piriformis syndrome of left side 08/15/2019   OSA (obstructive sleep apnea) 05/13/2019   CKD (chronic kidney disease) stage 3, GFR  30-59 ml/min (HCC) 04/13/2018   Closed fracture of distal lateral malleolus of left ankle 12/28/2017   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   S/P laparoscopic cholecystectomy 07/19/2017   Hospital discharge follow-up 07/19/2017   Acoustic neuroma ([place]) 06/12/2017   Colon cancer screening 03/08/2017   Nocturia 12/09/2016   Major depressive disorder, 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[place]) 06/05/2016   Encounter for preventive health examination 03/07/2015   Incisional hernia, without obstruction or gangrene 09/28/2014   Obesity 09/28/2014   Anxiety state 11/19/2013   Gastro-esophageal reflux disease without esophagitis 11/19/2013   Squamous cell carcinoma of skin of face 10/21/2011   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 Has been having foot pain since May 2022. Has been in a boot or something since then like and ace bandage. Pt [place] luggage rack [place] which bothered her foot. Had injections, had x-rays, nothing worked. Had to get surgery. Currently on elequis and metoprolol. Got an apple watch to watch her heart rate. Has Afib once every 24-36 hours. Has not yet had PT since her procedure. Also has an acoustic neuroma L ear which affects her balance, therefore she's using a wheel chair. Pt fell in the bathroom June 06, 2021. Hit her head but her R foot is fine. Head is ok. MD told her that she can weight bear with her CAM boot on Monday August 03, 2021. Start out walking gingerly to see if there is pain, then  gradually increase weight for 2-3 days, then midweek should be fine with full weight bearing.  PRECAUTIONS: atrial fibrillation  SUBJECTIVE:    Stress test did not go well. BP was 198/112. Finally came down to 179/98. Cardiac MD changed her flecainide dose. Has not hallucinated since then. R ankle is still a little sore. Wants to continue PT because R ankle does not feel consistent.   PAIN:  Are you having pain? 2/10 A lateral ankle soreness when walking, 0/10 at rest.     Objective   [email or web address]    Therapeutic exercise   HR at start of session: 60-62 bpm   Standing lunges with contralateral UE assist   R 10x  L 10x  Gait with R plantar arch supported. Decreased anterior lateral ankle pain. However has anterior ankle discomfort.    Standing B gastroc stretch at first stair step 30 seconds x 3   Rockerboard ankle PF/DF 2 minutes   Reviewed HEP and updated  Reviewed  POC: continue another 2x/week for 4 more weeks per pt request and to continue progress.   Directed patient with gait with normal gait speed, with changes in speed, 180 degree pivot turn, with R and L cervical rotation position, with cervical flexion and extension position, stepping around obstacles, stepping over an obstacle, ascending and descending 4 regular steps with UE assist     Standing B heel raises with B UE assist 10x3   Improved exercise technique, movement at target joints, use of target muscles after min to mod verbal, visual, tactile cues.       Response to treatment Pt tolerated session well without aggravation of symptoms.    Clinical impression  Pt demonstrates improved ability to ascend and descend stairs as well as step over obstacle during DGI today compared to previous measurement. Difficulty with vestibular related activities of the test. Pt also demonstrates improved B hip strength, and R foot function and ability to ambulate since initial evaluation. Pt making very  good progress with PT towards goals. Pt will benefit from continued skilled PT services for a few more weeks per request for pt to feel more consistent progress.  Pt tolerated session well withotu aggravation of symptoms. Pt will benefit from continued skilled physical therapy services to decrease pain, improve strength and function.              PATIENT EDUCATION: Education details: Cardiac nurse instructions Person educated: Patient and Spouse Education method: Explanation Education comprehension: verbalized understanding and returned demonstration   HOME EXERCISE PROGRAM: Access Code: [email or web address] URL: [email or web address] Date: 11/18/2021 Prepared by: Joneen Boers  Exercises - Sidelying Hip Abduction  - 1 x daily - 7 x weekly - 3 sets - 5 reps - Supine Posterior Pelvic Tilt  - 1 x daily - 7 x weekly - 3 sets - 10 reps - 5 seconds hold - Standing Gastroc Stretch at Counter  - 2 x daily - 7 x weekly - 1 sets - 3 reps - 30 seconds hold - Heel Toe Raises with Counter Support  - 1 x daily - 7 x weekly - 3 sets - 10 reps   PT Short Term Goals - 11/18/21 1121       PT SHORT TERM GOAL #1   Title Pt will be independent with her initial HEP to improve ankle AROM, R LE strength, and ability to ambulate with less difficulty.    Baseline Pt has started her HEP (07/29/2021); Pt performing her HEP. Could not remember how to do the S/L hip abduction (10/20/2021); No questions (11/18/2021)    Time 3    Period Weeks    Status Partially Met    Target Date 11/05/21              PT Long Term Goals - 11/18/21 1321       PT LONG TERM GOAL #1   Title Pt will improve R ankle DF AROM to 15 degrees and PF to 50 degrees to promote ability to ambulate with less difficulty.    Baseline R ankle DF 5 degrees, PF 32 degrees (07/29/2021); R ankle DF 9 degrees, PF 46 degres (10/20/2021)    Time 4    Period Weeks    Status Partially Met    Target Date 12/17/21      PT LONG TERM GOAL #2    Title Pt will improve R LE strength by at least 1/2 MMT grade to promote ability to perform standing tasks and ambulate with less  difficulty.    Baseline R hip flexion 4/5, extension 3+/5, abduction 4-/5, knee flexion and extension 4+/5. Ankle strength not yet tested (07/29/2021); R hip flexion 4+/5, extension 4/5, hip abduction 4+/5, knee flexion and extension 5/5, ankle DF 4+/5, ankle PF (manually resisted) 5/5 (10/20/2021)    Time 12    Period Weeks    Status Achieved    Target Date 10/22/21      PT LONG TERM GOAL #3   Title Pt will be able to ambulate at least 500 ft with regular shoe and no AD to promote mobility.    Baseline RW with R CAM boot, R LE NWB, 5 ft (07/29/2021); Able to ambulate > 500 ft (4 days ago) with regular shoe, no pain. (10/20/2021)    Time 12    Period Weeks    Status Achieved    Target Date 10/22/21      PT LONG TERM GOAL #4   Title Pt will improve her R foot FOTO score by at least 15 points as a demonstration of improved function.    Baseline R foot FOTO emailed to pt (score: 54 ) (07/29/2021); 65 (10/20/2021); 72 (11/18/2021)    Time 12    Period Weeks    Status Achieved    Target Date 10/22/21      PT LONG TERM GOAL #5   Title Pt will improve her DGI score to > 19 as a demonnstration of improved balance and decreased fall risk.    Baseline DGI 16 (10/20/2021); 18 (11/18/2021)    Time 4    Period Weeks    Status Partially Met    Target Date 12/17/21              Plan - 11/18/21 1121     Clinical Impression Statement Pt demonstrates improved ability to ascend and descend stairs as well as step over obstacle during DGI today compared to previous measurement. Difficulty with vestibular related activities of the test. Pt also demonstrates improved B hip strength, and R foot function and ability to ambulate since initial evaluation. Pt making very good progress with PT towards goals. Pt will benefit from continued skilled PT services for a few more weeks per  request for pt to feel more consistent progress.  Pt tolerated session well withotu aggravation of symptoms. Pt will benefit from continued skilled physical therapy services to decrease pain, improve strength and function.    Personal Factors and Comorbidities Age;Comorbidity 3+;Fitness;Time since onset of injury/illness/exacerbation    Comorbidities Acoustic neuroma L ear, anxiety, CA, depression, chronic kidney disease, HTN    Examination-Activity Limitations Bathing;Locomotion Level;Transfers;Carry;Squat;Stairs;Lift    Stability/Clinical Decision Making Stable/Uncomplicated   Afib 10/15/6158   Clinical Decision Making Low    Rehab Potential Fair    PT Frequency 2x / week    PT Duration 4 weeks    PT Treatment/Interventions Neuromuscular re-education;Gait training;Stair training;Functional mobility training;Therapeutic activities;Therapeutic exercise;Balance training;Patient/family education;Manual techniques;Scar mobilization;Dry needling;Vestibular;Electrical Stimulation;Canalith Repostioning;Iontophoresis 27m/ml Dexamethasone    PT Next Visit Plan Ankle ROM (DF, PF), hip and knee strengthening, gait, manual techniques, modalities PRN    PT Home Exercise Plan Medbridge Access Code [email or web address]    Consulted and Agree with Plan of Care Patient             Thank you for your referral.  MJoneen BoersPT, DPT  11/18/2021, 1:42 PM

## 2021-11-18 NOTE — Telephone Encounter (Signed)
Yes it has already been faxed.

## 2021-11-19 LAB — EXERCISE TOLERANCE TEST
Estimated workload: 6.9
Exercise duration (min): 4 min
Exercise duration (sec): 55 s
MPHR: 147 {beats}/min
Peak HR: 93 {beats}/min
Percent HR: 63 %
Rest HR: 69 {beats}/min

## 2021-11-20 MED ORDER — FLECAINIDE ACETATE 100 MG PO TABS: ORAL_TABLET | ORAL | 3 refills | Status: DC

## 2021-11-23 ENCOUNTER — Ambulatory Visit (INDEPENDENT_AMBULATORY_CARE_PROVIDER_SITE_OTHER): Payer: Medicare PPO | Admitting: Podiatry

## 2021-11-23 ENCOUNTER — Telehealth: Payer: Self-pay | Admitting: *Deleted

## 2021-11-23 ENCOUNTER — Encounter: Payer: Self-pay | Admitting: Internal Medicine

## 2021-11-23 ENCOUNTER — Ambulatory Visit: Payer: Medicare PPO | Admitting: Internal Medicine

## 2021-11-23 ENCOUNTER — Ambulatory Visit: Payer: Medicare PPO

## 2021-11-23 ENCOUNTER — Ambulatory Visit (INDEPENDENT_AMBULATORY_CARE_PROVIDER_SITE_OTHER): Payer: Medicare PPO

## 2021-11-23 VITALS — BP 136/82 | HR 60 | Temp 97.8°F | Ht 62.0 in | Wt 147.8 lb

## 2021-11-23 DIAGNOSIS — M25571 Pain in right ankle and joints of right foot: Secondary | ICD-10-CM

## 2021-11-23 DIAGNOSIS — M2011 Hallux valgus (acquired), right foot: Secondary | ICD-10-CM

## 2021-11-23 DIAGNOSIS — G8929 Other chronic pain: Secondary | ICD-10-CM | POA: Insufficient documentation

## 2021-11-23 DIAGNOSIS — G4733 Obstructive sleep apnea (adult) (pediatric): Secondary | ICD-10-CM

## 2021-11-23 DIAGNOSIS — M21611 Bunion of right foot: Secondary | ICD-10-CM | POA: Diagnosis not present

## 2021-11-23 DIAGNOSIS — F330 Major depressive disorder, recurrent, mild: Secondary | ICD-10-CM

## 2021-11-23 DIAGNOSIS — E663 Overweight: Secondary | ICD-10-CM

## 2021-11-23 DIAGNOSIS — Z9889 Other specified postprocedural states: Secondary | ICD-10-CM | POA: Diagnosis not present

## 2021-11-23 DIAGNOSIS — M19279 Secondary osteoarthritis, unspecified ankle and foot: Secondary | ICD-10-CM

## 2021-11-23 DIAGNOSIS — M960 Pseudarthrosis after fusion or arthrodesis: Secondary | ICD-10-CM

## 2021-11-23 DIAGNOSIS — R2689 Other abnormalities of gait and mobility: Secondary | ICD-10-CM

## 2021-11-23 DIAGNOSIS — R944 Abnormal results of kidney function studies: Secondary | ICD-10-CM

## 2021-11-23 DIAGNOSIS — R351 Nocturia: Secondary | ICD-10-CM

## 2021-11-23 HISTORY — DX: Other chronic pain: G89.29

## 2021-11-23 LAB — COMPREHENSIVE METABOLIC PANEL
ALT: 24 U/L (ref 0–35)
AST: 22 U/L (ref 0–37)
Albumin: 4.3 g/dL (ref 3.5–5.2)
Alkaline Phosphatase: 80 U/L (ref 39–117)
BUN: 19 mg/dL (ref 6–23)
CO2: 29 mEq/L (ref 19–32)
Calcium: 9.7 mg/dL (ref 8.4–10.5)
Chloride: 104 mEq/L (ref 96–112)
Creatinine, Ser: 1.22 mg/dL — ABNORMAL HIGH (ref 0.40–1.20)
GFR: 44.06 mL/min — ABNORMAL LOW (ref >60.00)
Glucose, Bld: 76 mg/dL (ref 70–99)
Potassium: 4 mEq/L (ref 3.5–5.1)
Sodium: 142 mEq/L (ref 135–145)
Total Bilirubin: 1.3 mg/dL — ABNORMAL HIGH (ref 0.2–1.2)
Total Protein: 6.7 g/dL (ref 6.0–8.3)

## 2021-11-23 LAB — URINALYSIS, ROUTINE W REFLEX MICROSCOPIC
Hgb urine dipstick: NEGATIVE
Leukocytes,Ua: NEGATIVE
Nitrite: NEGATIVE
RBC / HPF: NONE SEEN (ref 0–?)
Specific Gravity, Urine: >=1.03 — AB (ref 1.000–1.030)
Urine Glucose: NEGATIVE
Urobilinogen, UA: 1 (ref 0.0–1.0)
pH: 5.5 (ref 5.0–8.0)

## 2021-11-23 NOTE — Telephone Encounter (Signed)
Patient in office today provider tried referral to vestibular clinic for vestibular rehab , it is an order and not a referral, there is no referral for Vestibular. Dr. Darrick Huntsman placed this order patient need to go to [facility] vestibular clinic. PT vestibular rehab (Order [id number])

## 2021-11-23 NOTE — Progress Notes (Signed)
Subjective:  Patient ID: Kathy Long, female    DOB: [birth year]  Age: 71 y.o. MRN: [id number]  CC: The primary encounter diagnosis was Balance problems. Diagnoses of Nocturia, Decreased GFR, Chronic pain of right ankle, Overweight (BMI 25.0-29.9), Mild episode of recurrent major depressive disorder (HCC), and OSA (obstructive sleep apnea) were also pertinent to this visit.   HPI Kathy Long presents for  Chief Complaint  Patient presents with   Follow-up    Discuss the need for physical therapy   1) Balance issues.  She has  history fof a left  malleolar fracture,  right posterior arthrodesis , and acoustic schwannoma.  Referral to PT made June 4,   patient still waiting to be contacted.  She is receiving PT for right ankle weakness/rehab following surgery   2) OSA:  she has been intolerant of CPAP face mask and was was referred to [facility] by [facility] for INSPIRE as alternative to poorly tolerated CPAP.  This happened prior to her weight loss of 37 lbs . Has been unable to have a repeat study because insurance will not pay for it > 1 yr .  No longer snoring .   3)  BP WAS VERY ELEVATED DURING STRESS TEST  190/112. No longer taking amlodipine . Home readings have been < 140/80  4) TAKING flecainide metoprolol , Eliquis.  Did not tolerate 100 mg flecainide dose  caused visual hallucinations . Taking 50 mg bid caused return of atrial fib.  Now taking 100 mg flecainide in the am and 50 mg in the pm   5) urinary frequency last night  voided  12 times .during an 8 hour period.  She averages  2-3 voids per  night  usually,  but averages once a week of increased frequency .  Does not drink water at night. Caffeine and alcohol? No.  Urinary  frequency was her first sign of OSA  Outpatient Medications Prior to Visit  Medication Sig Dispense Refill   apixaban (ELIQUIS) 5 MG TABS tablet Take 1 tablet (5 mg total) by mouth 2 (two) times daily. 180 tablet 3   Cholecalciferol (VITAMIN D3) 75 MCG (3000  UT) TABS Take 6,000 Units by mouth daily.     citalopram (CELEXA) 20 MG tablet TAKE 1 TABLET BY MOUTH DAILY 90 tablet 1   clobetasol (TEMOVATE) 0.05 % external solution APPLY DAILY UNTIL RASH IS CLEAR 50 mL 0   Coenzyme Q10 100 MG capsule Take 100 mg by mouth daily.     COLLAGEN PO Take 1 tablet by mouth daily.     flecainide (TAMBOCOR) 100 MG tablet Take 100 mg (1 tablet) in the morning and 50 mg (1/2 tablet) in the evening. 126 tablet 3   melatonin 5 MG TABS Take 5 mg by mouth at bedtime.     metoprolol succinate (TOPROL-XL) 50 MG 24 hr tablet Take 1 tablet (50 mg total) by mouth daily. 90 tablet 3   omeprazole (PRILOSEC) 40 MG capsule TAKE 1 CAPSULE BY MOUTH ONCE DAILY 90 capsule 1   ondansetron (ZOFRAN) 4 MG tablet TAKE 1 TABLET BY MOUTH EVERY 8 HOURS AS NEEDED FOR NAUSEA AND VOMITING 20 tablet 0   rosuvastatin (CRESTOR) 10 MG tablet TAKE ONE TABLET BY MOUTH EVERY DAY 90 tablet 1   tirzepatide (MOUNJARO) 5 MG/0.5ML Pen INJECT 5MG  SUBCUTANEOUSLY ONCE A WEEK 2 mL 0   UNABLE TO FIND Take 1 tablet by mouth daily. Med Name: [employer] [place] [employer]  No facility-administered medications prior to visit.    Review of Systems;  Patient denies headache, fevers, malaise, unintentional weight loss, skin rash, eye pain, sinus congestion and sinus pain, sore throat, dysphagia,  hemoptysis , cough, dyspnea, wheezing, chest pain, palpitations, orthopnea, edema, abdominal pain, nausea, melena, diarrhea, constipation, flank pain, dysuria, hematuria, urinary  Frequency, nocturia, numbness, tingling, seizures,  Focal weakness, Loss of consciousness,  Tremor, insomnia, depression, anxiety, and suicidal ideation.      Objective:  BP 136/82 (BP Location: Left Arm, Patient Position: Sitting, Cuff Size: Normal)   Pulse 60   Temp 97.8 F (36.6 C) (Oral)   Ht 5\' 2"  (1.575 m)   Wt 147 lb 12.8 oz (67 kg)   SpO2 98%   BMI 27.03 kg/m   BP Readings from Last 3 Encounters:  11/23/21 136/82   10/09/21 132/88  10/05/21 110/64    Wt Readings from Last 3 Encounters:  11/23/21 147 lb 12.8 oz (67 kg)  10/09/21 151 lb (68.5 kg)  10/05/21 152 lb 6 oz (69.1 kg)    General appearance: alert, cooperative and appears stated age Ears: normal TM's and external ear canals both ears Throat: lips, mucosa, and tongue normal; teeth and gums normal Neck: no adenopathy, no carotid bruit, supple, symmetrical, trachea midline and thyroid not enlarged, symmetric, no tenderness/mass/nodules Back: symmetric, no curvature. ROM normal. No CVA tenderness. Lungs: clear to auscultation bilaterally Heart: regular rate and rhythm, S1, S2 normal, no murmur, click, rub or gallop Abdomen: soft, non-tender; bowel sounds normal; no masses,  no organomegaly Pulses: 2+ and symmetric Skin: Skin color, texture, turgor normal. No rashes or lesions Lymph nodes: Cervical, supraclavicular, and axillary nodes normal.  Lab Results  Component Value Date   HGBA1C 5.4 10/09/2021   HGBA1C 5.0 03/10/2021   HGBA1C 5.8 02/28/2019    Lab Results  Component Value Date   CREATININE 1.22 (H) 11/23/2021   CREATININE 1.06 (H) 10/09/2021   CREATININE 1.30 (H) 04/30/2021    Lab Results  Component Value Date   WBC 5.4 03/10/2021   HGB 13.6 03/10/2021   HCT 43 03/10/2021   PLT 196 03/10/2021   GLUCOSE 76 11/23/2021   CHOL 140 10/09/2021   TRIG 117 10/09/2021   HDL 48 (L) 10/09/2021   LDLCALC 72 10/09/2021   ALT 24 11/23/2021   AST 22 11/23/2021   NA 142 11/23/2021   K 4.0 11/23/2021   CL 104 11/23/2021   CREATININE 1.22 (H) 11/23/2021   BUN 19 11/23/2021   CO2 29 11/23/2021   TSH 1.17 03/10/2021   INR 0.91 10/23/2009   HGBA1C 5.4 10/09/2021   MICROALBUR 1.3 10/09/2021    MM 3D SCREEN BREAST BILATERAL  Result Date: 08/04/2021 CLINICAL DATA:  Screening. EXAM: DIGITAL SCREENING BILATERAL MAMMOGRAM WITH TOMOSYNTHESIS AND CAD TECHNIQUE: Bilateral screening digital craniocaudal and mediolateral oblique  mammograms were obtained. Bilateral screening digital breast tomosynthesis was performed. The images were evaluated with computer-aided detection. COMPARISON:  Previous exam(s). ACR Breast Density Category c: The breast tissue is heterogeneously dense, which may obscure small masses. FINDINGS: There are no findings suspicious for malignancy. IMPRESSION: No mammographic evidence of malignancy. A result letter of this screening mammogram will be mailed directly to the patient. RECOMMENDATION: Screening mammogram in one year. (Code:SM-B-01Y) BI-RADS CATEGORY  1: Negative. Electronically Signed   By: Frederico Hamman M.D.   On: 08/04/2021 11:01    Assessment & Plan:   Problem List Items Addressed This Visit     Overweight (BMI 25.0-29.9)  She is losing weight steadily with mounjaro, thus far > 35 lbs. Encouraged to exercise but somewhat limited thus far due to ankle surgery .  Advised to continue  use of Mounjaro       Major depressive disorder, 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HCC)    No longer using wellbutrin   Symptoms controlled with citalopram       OSA (obstructive sleep apnea)    Untreated due to mask intolerance. Now with significant weight loss resulting in  potential resolution given lack of symptoms.  Recommend waiting for a total of one year,  Then repeating study if she has recurrent atrial fibrillation flutter       Chronic pain of right ankle    Receiving PT for ankle . Ba;aance issues may be due to ankle weakness as well as  her h/o  acoustic schwannoma .  Vestibular rehabilitation PT ordered       Nocturia    Checking UA and culture       Relevant Orders   Urinalysis, Routine w reflex microscopic (Completed)   Urine Culture   Other Visit Diagnoses     Balance problems    -  Primary   Relevant Orders   PT vestibular rehab   Ambulatory referral to Physical Therapy   Decreased GFR       Relevant Orders   Comprehensive metabolic panel (Completed)       I spent a total of   32 minutes with this patient in a face to face visit on the date of this encounter reviewing the last office visit with me , her last several PT sessions.   most recent interaction with  patient's cardiologist in    ,  patient's diet and eating habits, home blood pressure readings ,  and post visit ordering of testing and therapeutics.    Follow-up: No follow-ups on file.   Sherlene Shams, MD

## 2021-11-24 ENCOUNTER — Other Ambulatory Visit: Payer: Self-pay | Admitting: Internal Medicine

## 2021-11-24 LAB — URINE CULTURE
MICRO NUMBER:: 13571888
SPECIMEN QUALITY:: ADEQUATE

## 2021-11-24 MED ORDER — MOUNJARO 5 MG/0.5ML ~~LOC~~ SOAJ: SUBCUTANEOUS | 0 refills | Status: DC

## 2021-11-25 NOTE — Progress Notes (Signed)
  Subjective:  Patient ID: Kathy Long, female    DOB: 07-Dec-1950,  MRN: [id number]  Chief Complaint  Patient presents with   Routine Post Op    POV  DOS 06/05/2020 FUSION OF SUBTARLAR & TALONAVICULAR JOINTS, BONE GRAFT FROM LEG  RT FOOT, PT AND FDL TENODESIS      71 y.o. female returns for post-op check.  Overall doing well pain has improved quite a bit she has been doing physical therapy.  Does note some pain after being on the foot for quite some time especially with therapy.  Feels like the strength is not quite there and is getting used to it but improved since last visit as well  Review of Systems: Negative except as noted in the HPI. Denies N/V/F/Ch.   Objective:  There were no vitals filed for this visit. There is no height or weight on file to calculate BMI. Constitutional Well developed. Well nourished.  Vascular Foot warm and well perfused. Capillary refill normal to all digits.  Calf is soft and supple, no posterior calf or knee pain, negative Homans' sign  Neurologic Normal speech. Oriented to person, place, and time. Epicritic sensation to light touch grossly present bilaterally.  Dermatologic Incisions are well-healed not hypertrophic  Orthopedic: Little to no pain.  Good position of the foot.  No notable edema currently   Multiple view plain film radiographs: New films taken today show maintained correction and good consolidation across the posterior facet of the subtalar joint, less across the middle facet.  In the dorsal medial portion of the talonavicular joint there is some slight persistent radiolucency Assessment:   1. Secondary osteoarthritis of talonavicular joint   2. S/P foot surgery, right   3. Pseudarthrosis after fusion or arthrodesis     Plan:  Patient was evaluated and treated and all questions answered.  S/p foot surgery right -Overall doing very well.  I do think she can continue physical therapy until she has maximized improvement in the  discontinue at that point probably another few weeks from now.  Okay to continue regular shoe gear as tolerated and activity as tolerated.  There is some persistent lucency in the talonavicular joint.  There may be some delayed union here.  Overall her symptoms are asymptomatic of a nonunion or delayed union, however I would like to evaluate the percentage of healing and so I have ordered a CT scan.  There are any areas of delayed fusion we will pursue noninvasive bone stimulation.  I will discuss this further with her once I have her CT scan results.  Return in 3 months to clinic for new x-rays or sooner if issues develop which we discussed.  Return in about 3 months (around 02/23/2022) for new xrays.

## 2021-11-26 ENCOUNTER — Ambulatory Visit: Payer: Medicare PPO

## 2021-11-26 ENCOUNTER — Encounter: Payer: Self-pay | Admitting: Internal Medicine

## 2021-11-26 DIAGNOSIS — M6281 Muscle weakness (generalized): Secondary | ICD-10-CM | POA: Diagnosis not present

## 2021-11-26 DIAGNOSIS — R351 Nocturia: Secondary | ICD-10-CM

## 2021-11-26 DIAGNOSIS — R262 Difficulty in walking, not elsewhere classified: Secondary | ICD-10-CM

## 2021-11-26 DIAGNOSIS — M25571 Pain in right ankle and joints of right foot: Secondary | ICD-10-CM

## 2021-11-26 NOTE — Therapy (Signed)
OUTPATIENT PHYSICAL THERAPY TREATMENT NOTE       Patient Name: Kathy Long MRN: [id number] DOB:09/23/1950, 71 y.o., female Today's Date: 11/26/2021  PCP: [name], MD REFERRING PROVIDER: Criselda Peaches, DPM   PT End of Session - 11/26/21 1153     Visit Number 21    Number of Visits 33    Date for PT Re-Evaluation 12/17/21    Authorization Type 1    Authorization Time Period of 10 progress report    PT Start Time 1153   pt arrived late   PT Stop Time 1234    PT Time Calculation (min) 41 min    Activity Tolerance Patient tolerated treatment well    Behavior During Therapy WFL for tasks assessed/performed             Past Medical History:  Diagnosis Date   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   Anxiety    Arthritis    lower back, hands   Cancer ([place])    melanoma, shoulder   Chronic kidney disease    Closed fracture of distal lateral malleolus of left ankle 12/28/2017   Depression    GERD (gastroesophageal reflux disease)    Hyperlipidemia    Hypertension    Postoperative bile leak    Sleep apnea    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    Past Surgical History:  Procedure Laterality Date   ANKLE FRACTURE SURGERY Left    APPENDECTOMY  2010   BACK SURGERY     disectomy lumbar, scar tissue   BREAST CYST EXCISION Right 1970   axilla   buninectomy Bilateral    CATARACT EXTRACTION W/PHACO Left 04/12/2018   Procedure: CATARACT EXTRACTION PHACO AND INTRAOCULAR LENS PLACEMENT ([place])  LEFT TORIC LENS;  Surgeon: Leandrew Koyanagi, MD;  Location: [facility];  Service: Ophthalmology;  Laterality: Left;   CATARACT EXTRACTION W/PHACO Right 05/10/2018   Procedure: CATARACT EXTRACTION PHACO AND INTRAOCULAR LENS PLACEMENT ([place])  RIGHT TORIC LENS;  Surgeon: Leandrew Koyanagi, MD;  Location: [facility];  Service: Ophthalmology;  Laterality: Right;  DIABETIC   CHOLECYSTECTOMY N/A 07/05/2017   Procedure: LAPAROSCOPIC CHOLECYSTECTOMY WITH  INTRAOPERATIVE CHOLANGIOGRAM;  Surgeon: Robert Bellow, MD;  Location: [facility];  Service: General;  Laterality: N/A;   COLONOSCOPY     2009 and color gaurd in 2018   COLONOSCOPY N/A 03/25/2020   Procedure: COLONOSCOPY;  Surgeon: [name], MD;  Location: [facility];  Service: Endoscopy;  Laterality: N/A;   DRUG INDUCED ENDOSCOPY N/A 05/06/2021   Procedure: DRUG INDUCED SLEEP ENDOSCOPY;  Surgeon: Melida Quitter, MD;  Location: [facility];  Service: ENT;  Laterality: N/A;   ERCP N/A 07/12/2017   Procedure: ENDOSCOPIC RETROGRADE CHOLANGIOPANCREATOGRAPHY (ERCP);  Surgeon: Lucilla Lame, MD;  Location: [facility]COPY;  Service: Endoscopy;  Laterality: N/A;   ERCP N/A 10/04/2017   Procedure: ENDOSCOPIC RETROGRADE CHOLANGIOPANCREATOGRAPHY (ERCP);  Surgeon: Lucilla Lame, MD;  Location: [facility];  Service: Endoscopy;  Laterality: N/A;   FOOT SURGERY     x 2   INCISIONAL HERNIA REPAIR N/A 12/04/2020   Procedure: VENTRAL INCISIONAL HERNIA REPAIR WITH MESH;  Surgeon: Armandina Gemma, MD;  Location: [facility];  Service: General;  Laterality: N/A;   MELANOMA EXCISION Left 2000   PALATOPLASTY N/A 04/08/2015   Procedure: PALATOPLASTY;  Surgeon: Beverly Gust, MD;  Location: [facility];  Service: ENT;  Laterality: N/A;   TONSILLECTOMY N/A 04/08/2015   Procedure: TONSILLECTOMY;  Surgeon: Freda Munro  Tami Ribas, MD;  Location: [facility];  Service: ENT;  Laterality: N/A;   TUBAL LIGATION     Patient Active Problem List   Diagnosis Date Noted   Chronic pain of right ankle 11/23/2021   Overweight (BMI 25.0-29.9) 11/23/2021   Paroxysmal atrial fibrillation ([place]) 10/09/2021   Aortic atherosclerosis ([place]) 05/12/2021   History of COVID-19 05/12/2021   Positive colorectal cancer screening using Cologuard test 02/05/2020   Esophageal dysphagia 12/11/2019   Hyperlipidemia LDL goal <100 12/10/2019   Right shoulder pain 10/17/2019   Arthritis of right acromioclavicular joint 08/15/2019    Piriformis syndrome of left side 08/15/2019   OSA (obstructive sleep apnea) 05/13/2019   CKD (chronic kidney disease) stage 3, GFR 30-59 ml/min (HCC) 04/13/2018   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   S/P laparoscopic cholecystectomy 07/19/2017   Hospital discharge follow-up 07/19/2017   Acoustic neuroma ([place]) 06/12/2017   Colon cancer screening 03/08/2017   Nocturia 12/09/2016   Major depressive disorder, 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[place]) 06/05/2016   Encounter for preventive health examination 03/07/2015   Incisional hernia, without obstruction or gangrene 09/28/2014   Anxiety state 11/19/2013   Gastro-esophageal reflux disease without esophagitis 11/19/2013   Squamous cell carcinoma of skin of face 10/21/2011   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 Has been having foot pain since May 2022. Has been in a boot or something since then like and ace bandage. Pt push a luggage rack [place] which bothered her foot. Had injections, had x-rays, nothing worked. Had to get surgery. Currently on elequis and metoprolol. Got an apple watch to watch her heart rate. Has Afib once every 24-36 hours. Has not yet had PT since her procedure. Also has an acoustic neuroma L ear which affects her balance, therefore she's using a wheel chair. Pt fell in the bathroom June 06, 2021. Hit her head but her R foot is fine. Head is ok. MD told her that she can weight bear with her CAM boot on Monday August 03, 2021. Start out walking  gingerly to see if there is pain, then gradually increase weight for 2-3 days, then midweek should be fine with full weight bearing.  PRECAUTIONS: atrial fibrillation  SUBJECTIVE:    Saw surgeon, getting CT scan to see how much healing.    PAIN:  Are you having pain? 1/10 anterior lateral ankle soreness when walking, 0/10 at rest.     Objective   [email or web address]    Therapeutic exercise   HR at start of session: 58-59 bpm   Gentle self talocrural joint mobilization with blue band, R foot on 4 inch step  10x5 seconds for 2 sets   Decreased anterior lateral ankle discomfort.   Rockerboard ankle PF/DF 2 minutes    Standing B heel toe raises with B UE assist 10x3    Standing lunges with contralateral UE assist   R 10x3  L 10x3  SLS with L UE light touch assist  R 10x5 seconds for 2 sets   Stepping over 4  mini hurdles 4x  Forward step up onto and over 4 inch step using R LE, one UE PRN  10x2   Standing B gastroc stretch at first stair step 30 seconds x 3     Improved exercise technique, movement at target joints, use of target muscles after min to mod verbal, visual, tactile cues.       Response to treatment Pt tolerated session well without aggravation of symptoms.    Clinical impression   Continued working on improving R ankle DF, as well as closed chain ankle and R LE strength to promote ability to ambulate and negotiate obstacles with less difficulty. No R foot or ankle pain reported after session. Pt will benefit from continued skilled physical therapy services to decrease pain, improve strength and function.              PATIENT EDUCATION: Education details: Cardiac nurse instructions Person educated: Patient and Spouse Education method: Explanation Education comprehension: verbalized understanding and returned demonstration   HOME EXERCISE PROGRAM: Access Code: [email or web address] URL: [email or web address] Date:  11/18/2021 Prepared by: Joneen Boers  Exercises - Sidelying Hip Abduction  - 1 x daily - 7 x weekly - 3 sets - 5 reps - Supine Posterior Pelvic Tilt  - 1 x daily - 7 x weekly - 3 sets - 10 reps - 5 seconds hold - Standing Gastroc Stretch at Counter  - 2 x daily - 7 x weekly - 1 sets - 3 reps - 30 seconds hold - Heel Toe Raises with Counter Support  - 1 x daily - 7 x weekly - 3 sets - 10 reps   PT Short Term Goals - 11/18/21 1121       PT SHORT TERM GOAL #1   Title Pt will be independent with her initial HEP to improve ankle AROM, R LE strength, and ability to ambulate with less difficulty.    Baseline Pt has started her HEP (07/29/2021); Pt performing her HEP. Could not remember how to do the S/L hip abduction (10/20/2021); No questions (11/18/2021)    Time 3    Period Weeks    Status Partially Met    Target Date 11/05/21              PT Long Term Goals - 11/18/21 1321       PT LONG TERM GOAL #1   Title Pt will improve R ankle DF AROM to 15 degrees and PF to 50 degrees to promote ability to ambulate with less difficulty.    Baseline R ankle DF 5 degrees, PF 32 degrees (07/29/2021); R ankle DF 9 degrees, PF 46 degres (10/20/2021)    Time 4    Period Weeks    Status Partially Met    Target Date 12/17/21      PT LONG TERM GOAL #2   Title Pt will improve R LE strength by at least 1/2 MMT grade to promote ability to perform standing tasks and ambulate with less difficulty.    Baseline R hip flexion 4/5, extension 3+/5, abduction 4-/5, knee flexion and extension 4+/5. Ankle strength not yet tested (07/29/2021); R hip flexion 4+/5, extension 4/5, hip abduction 4+/5, knee flexion and extension 5/5, ankle DF 4+/5, ankle PF (manually resisted) 5/5 (10/20/2021)    Time 12    Period Weeks    Status Achieved    Target Date 10/22/21      PT LONG TERM GOAL #3   Title Pt will be able to ambulate at least  500 ft with regular shoe and no AD to promote mobility.    Baseline RW with R CAM boot, R LE  NWB, 5 ft (07/29/2021); Able to ambulate > 500 ft (4 days ago) with regular shoe, no pain. (10/20/2021)    Time 12    Period Weeks    Status Achieved    Target Date 10/22/21      PT LONG TERM GOAL #4   Title Pt will improve her R foot FOTO score by at least 15 points as a demonstration of improved function.    Baseline R foot FOTO emailed to pt (score: 54 ) (07/29/2021); 65 (10/20/2021); 72 (11/18/2021)    Time 12    Period Weeks    Status Achieved    Target Date 10/22/21      PT LONG TERM GOAL #5   Title Pt will improve her DGI score to > 19 as a demonnstration of improved balance and decreased fall risk.    Baseline DGI 16 (10/20/2021); 18 (11/18/2021)    Time 4    Period Weeks    Status Partially Met    Target Date 12/17/21              Plan - 11/26/21 1156     Clinical Impression Statement Continued working on improving R ankle DF, as well as closed chain ankle and R LE strength to promote ability to ambulate and negotiate obstacles with less difficulty. No R foot or ankle pain reported after session. Pt will benefit from continued skilled physical therapy services to decrease pain, improve strength and function.    Personal Factors and Comorbidities Age;Comorbidity 3+;Fitness;Time since onset of injury/illness/exacerbation    Comorbidities Acoustic neuroma L ear, anxiety, CA, depression, chronic kidney disease, HTN    Examination-Activity Limitations Bathing;Locomotion Level;Transfers;Carry;Squat;Stairs;Lift    Stability/Clinical Decision Making Stable/Uncomplicated   Afib 11/28/1599   Rehab Potential Fair    PT Frequency 2x / week    PT Duration 4 weeks    PT Treatment/Interventions Neuromuscular re-education;Gait training;Stair training;Functional mobility training;Therapeutic activities;Therapeutic exercise;Balance training;Patient/family education;Manual techniques;Scar mobilization;Dry needling;Vestibular;Electrical Stimulation;Canalith Repostioning;Iontophoresis 8m/ml  Dexamethasone    PT Next Visit Plan Ankle ROM (DF, PF), hip and knee strengthening, gait, manual techniques, modalities PRN    PT Home Exercise Plan [place] Agree with Plan of Care Patient              MJoneen BoersPT, DPT  11/26/2021, 12:41 PM

## 2021-11-27 ENCOUNTER — Ambulatory Visit
Admission: RE | Admit: 2021-11-27 | Discharge: 2021-11-27 | Disposition: A | Discharge status: Home / Self Care | Payer: Medicare PPO | Source: Ambulatory Visit | Attending: Unknown Physician Specialty | Admitting: Unknown Physician Specialty

## 2021-11-27 DIAGNOSIS — D333 Benign neoplasm of cranial nerves: Secondary | ICD-10-CM | POA: Diagnosis not present

## 2021-11-27 MED ORDER — GADOBENATE DIMEGLUMINE 529 MG/ML IV SOLN
13.0000 mL | Freq: Once | INTRAVENOUS | Status: AC | PRN
  Administered 2021-11-27: 13 mL via INTRAVENOUS

## 2021-11-27 NOTE — Telephone Encounter (Signed)
LMTCB

## 2021-11-30 ENCOUNTER — Encounter: Payer: Self-pay | Admitting: Internal Medicine

## 2021-11-30 NOTE — Telephone Encounter (Signed)
I have pended the referral for approval.

## 2021-12-02 ENCOUNTER — Encounter: Payer: Medicare PPO | Admitting: Podiatry

## 2021-12-02 ENCOUNTER — Ambulatory Visit: Payer: Medicare PPO | Attending: Podiatry

## 2021-12-02 DIAGNOSIS — R2689 Other abnormalities of gait and mobility: Secondary | ICD-10-CM | POA: Diagnosis not present

## 2021-12-02 DIAGNOSIS — R2681 Unsteadiness on feet: Secondary | ICD-10-CM | POA: Insufficient documentation

## 2021-12-02 DIAGNOSIS — M25571 Pain in right ankle and joints of right foot: Secondary | ICD-10-CM | POA: Diagnosis not present

## 2021-12-02 DIAGNOSIS — R262 Difficulty in walking, not elsewhere classified: Secondary | ICD-10-CM | POA: Insufficient documentation

## 2021-12-02 DIAGNOSIS — R42 Dizziness and giddiness: Secondary | ICD-10-CM | POA: Insufficient documentation

## 2021-12-02 DIAGNOSIS — M6281 Muscle weakness (generalized): Secondary | ICD-10-CM | POA: Insufficient documentation

## 2021-12-02 NOTE — Therapy (Signed)
OUTPATIENT PHYSICAL THERAPY TREATMENT NOTE       Patient Name: Kathy Long MRN: [id number] DOB:04/01/1951, 71 y.o., female Today's Date: 12/02/2021  PCP: [name], MD REFERRING PROVIDER: Criselda Peaches, DPM   PT End of Session - 12/02/21 1144     Visit Number 22    Number of Visits 33    Date for PT Re-Evaluation 12/17/21    Authorization Type 2    Authorization Time Period of 10 progress report    PT Start Time 5170    PT Stop Time 1225    PT Time Calculation (min) 40 min    Activity Tolerance Patient tolerated treatment well    Behavior During Therapy WFL for tasks assessed/performed              Past Medical History:  Diagnosis Date   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   Anxiety    Arthritis    lower back, hands   Cancer ([place])    melanoma, shoulder   Chronic kidney disease    Closed fracture of distal lateral malleolus of left ankle 12/28/2017   Depression    GERD (gastroesophageal reflux disease)    Hyperlipidemia    Hypertension    Postoperative bile leak    Sleep apnea    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    Past Surgical History:  Procedure Laterality Date   ANKLE FRACTURE SURGERY Left    APPENDECTOMY  2010   BACK SURGERY     disectomy lumbar, scar tissue   BREAST CYST EXCISION Right 1970   axilla   buninectomy Bilateral    CATARACT EXTRACTION W/PHACO Left 04/12/2018   Procedure: CATARACT EXTRACTION PHACO AND INTRAOCULAR LENS PLACEMENT ([place])  LEFT TORIC LENS;  Surgeon: Leandrew Koyanagi, MD;  Location: [facility];  Service: Ophthalmology;  Laterality: Left;   CATARACT EXTRACTION W/PHACO Right 05/10/2018   Procedure: CATARACT EXTRACTION PHACO AND INTRAOCULAR LENS PLACEMENT ([place])  RIGHT TORIC LENS;  Surgeon: Leandrew Koyanagi, MD;  Location: [facility];  Service: Ophthalmology;  Laterality: Right;  DIABETIC   CHOLECYSTECTOMY N/A 07/05/2017   Procedure: LAPAROSCOPIC CHOLECYSTECTOMY WITH INTRAOPERATIVE  CHOLANGIOGRAM;  Surgeon: Robert Bellow, MD;  Location: [facility];  Service: General;  Laterality: N/A;   COLONOSCOPY     2009 and color gaurd in 2018   COLONOSCOPY N/A 03/25/2020   Procedure: COLONOSCOPY;  Surgeon: [name], MD;  Location: [facility];  Service: Endoscopy;  Laterality: N/A;   DRUG INDUCED ENDOSCOPY N/A 05/06/2021   Procedure: DRUG INDUCED SLEEP ENDOSCOPY;  Surgeon: Melida Quitter, MD;  Location: [facility];  Service: ENT;  Laterality: N/A;   ERCP N/A 07/12/2017   Procedure: ENDOSCOPIC RETROGRADE CHOLANGIOPANCREATOGRAPHY (ERCP);  Surgeon: Lucilla Lame, MD;  Location: [facility];  Service: Endoscopy;  Laterality: N/A;   ERCP N/A 10/04/2017   Procedure: ENDOSCOPIC RETROGRADE CHOLANGIOPANCREATOGRAPHY (ERCP);  Surgeon: Lucilla Lame, MD;  Location: [facility];  Service: Endoscopy;  Laterality: N/A;   FOOT SURGERY     x 2   INCISIONAL HERNIA REPAIR N/A 12/04/2020   Procedure: VENTRAL INCISIONAL HERNIA REPAIR WITH MESH;  Surgeon: Armandina Gemma, MD;  Location: [facility];  Service: General;  Laterality: N/A;   MELANOMA EXCISION Left 2000   PALATOPLASTY N/A 04/08/2015   Procedure: PALATOPLASTY;  Surgeon: Beverly Gust, MD;  Location: [facility];  Service: ENT;  Laterality: N/A;   TONSILLECTOMY N/A 04/08/2015   Procedure: TONSILLECTOMY;  Surgeon: Beverly Gust, MD;  Location: [facility];  Service: ENT;  Laterality: N/A;   TUBAL LIGATION     Patient Active Problem List   Diagnosis Date Noted   Chronic pain of right ankle 11/23/2021   Overweight (BMI 25.0-29.9) 11/23/2021   Paroxysmal atrial fibrillation ([place]) 10/09/2021   Aortic atherosclerosis ([place]) 05/12/2021   History of COVID-19 05/12/2021   Positive colorectal cancer screening using Cologuard test 02/05/2020   Esophageal dysphagia 12/11/2019   Hyperlipidemia LDL goal <100 12/10/2019   Right shoulder pain 10/17/2019   Arthritis of right acromioclavicular joint 08/15/2019   Piriformis syndrome  of left side 08/15/2019   OSA (obstructive sleep apnea) 05/13/2019   CKD (chronic kidney disease) stage 3, GFR 30-59 ml/min (HCC) 04/13/2018   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   S/P laparoscopic cholecystectomy 07/19/2017   Hospital discharge follow-up 07/19/2017   Acoustic neuroma ([place]) 06/12/2017   Colon cancer screening 03/08/2017   Nocturia 12/09/2016   Major depressive disorder, 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[place]) 06/05/2016   Encounter for preventive health examination 03/07/2015   Incisional hernia, without obstruction or gangrene 09/28/2014   Anxiety state 11/19/2013   Gastro-esophageal reflux disease without esophagitis 11/19/2013   Squamous cell carcinoma of skin of face 10/21/2011   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 Has been having foot pain since May 2022. Has been in a boot or something since then like and ace bandage. Pt push a luggage rack [place] a [place]22 which bothered her foot. Had injections, had x-rays, nothing worked. Had to get surgery. Currently on elequis and metoprolol. Got an apple watch to watch her heart rate. Has Afib once every 24-36 hours. Has not yet had PT since her procedure. Also has an acoustic neuroma L ear which affects her balance, therefore she's using a wheel chair. Pt fell in the bathroom June 06, 2021. Hit her head but her R foot is fine. Head is ok. MD told her that she can weight bear with her CAM boot on Monday August 03, 2021. Start out walking gingerly to see if  there is pain, then gradually increase weight for 2-3 days, then midweek should be fine with full weight bearing.  PRECAUTIONS: atrial fibrillation  SUBJECTIVE:    Wants to graduate 12/09/2021 then start vestibular PT. Afib is better. Did a lot of walking July 3 and R ankle did well.   PAIN:  Are you having pain? 0/10 anterior lateral ankle soreness when walking, 0/10 at rest.     Objective   [email or web address]    Last scheduled appointment: 12/09/2021   Therapeutic exercise   HR at start of session: 62-63 bpm  Standing B heel toe raises with B UE assist 10x3  Standing lunges with contralateral UE assist   R 10x3  L 10x3  SLS with L UE light touch assist  R 10x5 seconds for 3 sets   Standing B gastroc stretch at first stair step 30 seconds x 3  Forward step up onto and over 4 inch step using R LE, one UE PRN  10x2  Rockerboard ankle PF/DF 2 minutes    Stepping over 4 mini hurdles 6x  Closed chain glute med weakness R > L observed   Standing hip abduction with B UE assist   R 9x5 seconds   L 9x5 seconds     Improved exercise technique, movement at target joints, use of target muscles after min to mod verbal, visual, tactile cues.       Response to treatment Pt tolerated session well without aggravation of symptoms.    Clinical impression  Pt currently able to ambulate longer distances without R foot or ankle pain based on subjective reports. Continued working on improving R ankle DF, as well as closed chain ankle and R LE strength to promote ability to ambulate and negotiate obstacles with less difficulty. Pt tolerated session well without aggravation of symptoms. Pt will benefit from continued skilled physical therapy services to decrease pain, improve strength and function.              PATIENT EDUCATION: Education details: Cardiac nurse instructions Person educated: Patient and Spouse Education method: Explanation Education  comprehension: verbalized understanding and returned demonstration   HOME EXERCISE PROGRAM: Access Code: [email or web address] URL: [email or web address] Date: 11/18/2021 Prepared by: Joneen Boers  Exercises - Sidelying Hip Abduction  - 1 x daily - 7 x weekly - 3 sets - 5 reps - Supine Posterior Pelvic Tilt  - 1 x daily - 7 x weekly - 3 sets - 10 reps - 5 seconds hold - Standing Gastroc Stretch at Counter  - 2 x daily - 7 x weekly - 1 sets - 3 reps - 30 seconds hold - Heel Toe Raises with Counter Support  - 1 x daily - 7 x weekly - 3 sets - 10 reps   PT Short Term Goals - 11/18/21 1121       PT SHORT TERM GOAL #1   Title Pt will be independent with her initial HEP to improve ankle AROM, R LE strength, and ability to ambulate with less difficulty.    Baseline Pt has started her HEP (07/29/2021); Pt performing her HEP. Could not remember how to do the S/L hip abduction (10/20/2021); No questions (11/18/2021)    Time 3    Period Weeks    Status Partially Met    Target Date 11/05/21              PT Long Term Goals - 11/18/21 1321       PT LONG TERM GOAL #1   Title Pt will improve R ankle DF AROM to 15 degrees and PF to 50 degrees to promote ability to ambulate with less difficulty.    Baseline R ankle DF 5 degrees, PF 32 degrees (07/29/2021); R ankle DF 9 degrees, PF 46 degres (10/20/2021)    Time 4    Period Weeks    Status Partially Met    Target Date 12/17/21      PT LONG TERM GOAL #2   Title Pt will improve R LE strength by at least 1/2 MMT grade to promote ability to perform standing tasks and ambulate with less difficulty.    Baseline R hip flexion 4/5, extension 3+/5, abduction 4-/5, knee flexion and extension 4+/5. Ankle strength not yet tested (07/29/2021); R hip flexion 4+/5, extension 4/5, hip abduction 4+/5, knee flexion and extension 5/5, ankle DF 4+/5, ankle PF (manually resisted) 5/5 (10/20/2021)    Time 12    Period Weeks    Status Achieved  Target Date 10/22/21       PT LONG TERM GOAL #3   Title Pt will be able to ambulate at least 500 ft with regular shoe and no AD to promote mobility.    Baseline RW with R CAM boot, R LE NWB, 5 ft (07/29/2021); Able to ambulate > 500 ft (4 days ago) with regular shoe, no pain. (10/20/2021)    Time 12    Period Weeks    Status Achieved    Target Date 10/22/21      PT LONG TERM GOAL #4   Title Pt will improve her R foot FOTO score by at least 15 points as a demonstration of improved function.    Baseline R foot FOTO emailed to pt (score: 54 ) (07/29/2021); 65 (10/20/2021); 72 (11/18/2021)    Time 12    Period Weeks    Status Achieved    Target Date 10/22/21      PT LONG TERM GOAL #5   Title Pt will improve her DGI score to > 19 as a demonnstration of improved balance and decreased fall risk.    Baseline DGI 16 (10/20/2021); 18 (11/18/2021)    Time 4    Period Weeks    Status Partially Met    Target Date 12/17/21              Plan - 12/02/21 1149     Clinical Impression Statement Pt currently able to ambulate longer distances without R foot or ankle pain based on subjective reports. Continued working on improving R ankle DF, as well as closed chain ankle and R LE strength to promote ability to ambulate and negotiate obstacles with less difficulty. Pt tolerated session well without aggravation of symptoms. Pt will benefit from continued skilled physical therapy services to decrease pain, improve strength and function.    Personal Factors and Comorbidities Age;Comorbidity 3+;Fitness;Time since onset of injury/illness/exacerbation    Comorbidities Acoustic neuroma L ear, anxiety, CA, depression, chronic kidney disease, HTN    Examination-Activity Limitations Bathing;Locomotion Level;Transfers;Carry;Squat;Stairs;Lift    Stability/Clinical Decision Making Stable/Uncomplicated   Afib 1/85/6314   Clinical Decision Making Low    Rehab Potential Fair    PT Frequency 2x / week    PT Duration 4 weeks    PT  Treatment/Interventions Neuromuscular re-education;Gait training;Stair training;Functional mobility training;Therapeutic activities;Therapeutic exercise;Balance training;Patient/family education;Manual techniques;Scar mobilization;Dry needling;Vestibular;Electrical Stimulation;Canalith Repostioning;Iontophoresis 80m/ml Dexamethasone    PT Next Visit Plan Ankle ROM (DF, PF), hip and knee strengthening, gait, manual techniques, modalities PRN    PT Home Exercise Plan [place] Agree with Plan of Care Patient               MJoneen BoersPT, DPT  12/02/2021, 12:27 PM

## 2021-12-04 ENCOUNTER — Ambulatory Visit
Admission: RE | Admit: 2021-12-04 | Discharge: 2021-12-04 | Disposition: A | Discharge status: Home / Self Care | Payer: Medicare PPO | Source: Ambulatory Visit | Attending: Podiatry | Admitting: Podiatry

## 2021-12-04 DIAGNOSIS — Z981 Arthrodesis status: Secondary | ICD-10-CM | POA: Diagnosis not present

## 2021-12-04 DIAGNOSIS — M7731 Calcaneal spur, right foot: Secondary | ICD-10-CM | POA: Diagnosis not present

## 2021-12-04 DIAGNOSIS — M960 Pseudarthrosis after fusion or arthrodesis: Secondary | ICD-10-CM

## 2021-12-04 DIAGNOSIS — M19071 Primary osteoarthritis, right ankle and foot: Secondary | ICD-10-CM | POA: Diagnosis not present

## 2021-12-04 DIAGNOSIS — M85871 Other specified disorders of bone density and structure, right ankle and foot: Secondary | ICD-10-CM | POA: Diagnosis not present

## 2021-12-09 ENCOUNTER — Ambulatory Visit: Payer: Medicare PPO

## 2021-12-09 DIAGNOSIS — M6281 Muscle weakness (generalized): Secondary | ICD-10-CM | POA: Diagnosis not present

## 2021-12-09 DIAGNOSIS — M25571 Pain in right ankle and joints of right foot: Secondary | ICD-10-CM | POA: Diagnosis not present

## 2021-12-09 DIAGNOSIS — R262 Difficulty in walking, not elsewhere classified: Secondary | ICD-10-CM

## 2021-12-09 DIAGNOSIS — R2689 Other abnormalities of gait and mobility: Secondary | ICD-10-CM | POA: Diagnosis not present

## 2021-12-09 DIAGNOSIS — R42 Dizziness and giddiness: Secondary | ICD-10-CM | POA: Diagnosis not present

## 2021-12-09 DIAGNOSIS — R2681 Unsteadiness on feet: Secondary | ICD-10-CM | POA: Diagnosis not present

## 2021-12-09 NOTE — Therapy (Signed)
OUTPATIENT PHYSICAL THERAPY TREATMENT NOTE And Discharger Summary       Patient Name: Kathy Long MRN: [id number] DOB:[birth year], 71 y.o., female Today's Date: 12/09/2021  PCP: [name], MD REFERRING PROVIDER: Criselda Peaches, DPM   PT End of Session - 12/09/21 1631     Visit Number 23    Number of Visits 33    Date for PT Re-Evaluation 12/17/21    Authorization Type 3    Authorization Time Period of 10 progress report    PT Start Time 1631    PT Stop Time 6599    PT Time Calculation (min) 41 min    Activity Tolerance Patient tolerated treatment well    Behavior During Therapy WFL for tasks assessed/performed               Past Medical History:  Diagnosis Date   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   Anxiety    Arthritis    lower back, hands   Cancer ([place])    melanoma, shoulder   Chronic kidney disease    Closed fracture of distal lateral malleolus of left ankle 12/28/2017   Depression    GERD (gastroesophageal reflux disease)    Hyperlipidemia    Hypertension    Postoperative bile leak    Sleep apnea    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    Past Surgical History:  Procedure Laterality Date   ANKLE FRACTURE SURGERY Left    APPENDECTOMY  2010   BACK SURGERY     disectomy lumbar, scar tissue   BREAST CYST EXCISION Right 1970   axilla   buninectomy Bilateral    CATARACT EXTRACTION W/PHACO Left 04/12/2018   Procedure: CATARACT EXTRACTION PHACO AND INTRAOCULAR LENS PLACEMENT ([place])  LEFT TORIC LENS;  Surgeon: Leandrew Koyanagi, MD;  Location: [place];  Service: Ophthalmology;  Laterality: Left;   CATARACT EXTRACTION W/PHACO Right 05/10/2018   Procedure: CATARACT EXTRACTION PHACO AND INTRAOCULAR LENS PLACEMENT ([place])  RIGHT TORIC LENS;  Surgeon: Leandrew Koyanagi, MD;  Location: [place];  Service: Ophthalmology;  Laterality: Right;  DIABETIC   CHOLECYSTECTOMY N/A 07/05/2017   Procedure: LAPAROSCOPIC CHOLECYSTECTOMY WITH  INTRAOPERATIVE CHOLANGIOGRAM;  Surgeon: Robert Bellow, MD;  Location: [facility];  Service: General;  Laterality: N/A;   COLONOSCOPY     2009 and color gaurd in 2018   COLONOSCOPY N/A 03/25/2020   Procedure: COLONOSCOPY;  Surgeon: [name], MD;  Location: [facility];  Service: Endoscopy;  Laterality: N/A;   DRUG INDUCED ENDOSCOPY N/A 05/06/2021   Procedure: DRUG INDUCED SLEEP ENDOSCOPY;  Surgeon: Melida Quitter, MD;  Location: [place];  Service: ENT;  Laterality: N/A;   ERCP N/A 07/12/2017   Procedure: ENDOSCOPIC RETROGRADE CHOLANGIOPANCREATOGRAPHY (ERCP);  Surgeon: Lucilla Lame, MD;  Location: [facility]COPY;  Service: Endoscopy;  Laterality: N/A;   ERCP N/A 10/04/2017   Procedure: ENDOSCOPIC RETROGRADE CHOLANGIOPANCREATOGRAPHY (ERCP);  Surgeon: Lucilla Lame, MD;  Location[facility];  Service: Endoscopy;  Laterality: N/A;   FOOT SURGERY     x 2   INCISIONAL HERNIA REPAIR N/A 12/04/2020   Procedure: VENTRAL INCISIONAL HERNIA REPAIR WITH MESH;  Surgeon: Armandina Gemma, MD;  Location: [facility];  Service: General;  Laterality: N/A;   MELANOMA EXCISION Left 2000   PALATOPLASTY N/A 04/08/2015   Procedure: PALATOPLASTY;  Surgeon: [place] Gust, MD;  Location: [facility];  Service: ENT;  Laterality: N/A;   TONSILLECTOMY N/A 04/08/2015   Procedure: TONSILLECTOMY;  Surgeon:  [place] Gust, MD;  Location: [facility];  Service: ENT;  Laterality: N/A;   TUBAL LIGATION     Patient Active Problem List   Diagnosis Date Noted   Chronic pain of right ankle 11/23/2021   Overweight (BMI 25.0-29.9) 11/23/2021   Paroxysmal atrial fibrillation ([place]) 10/09/2021   Aortic atherosclerosis ([place]) 05/12/2021   History of COVID-19 05/12/2021   Positive colorectal cancer screening using Cologuard test 02/05/2020   Esophageal dysphagia 12/11/2019   Hyperlipidemia LDL goal <100 12/10/2019   Right shoulder pain 10/17/2019   Arthritis of right acromioclavicular joint 08/15/2019    Piriformis syndrome of left side 08/15/2019   OSA (obstructive sleep apnea) 05/13/2019   CKD (chronic kidney disease) stage 3, GFR 30-59 ml/min (HCC) 04/13/2018   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   S/P laparoscopic cholecystectomy 07/19/2017   Hospital discharge follow-up 07/19/2017   Acoustic neuroma ([place]) 06/12/2017   Colon cancer screening 03/08/2017   Nocturia 12/09/2016   Major depressive disorder, 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[place]) 06/05/2016   Encounter for preventive health examination 03/07/2015   Incisional hernia, without obstruction or gangrene 09/28/2014   Anxiety state 11/19/2013   Gastro-esophageal reflux disease without esophagitis 11/19/2013   Squamous cell carcinoma of skin of face 10/21/2011   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 Has been having foot pain since May 2022. Has been in a boot or something since then like and ace bandage. Pt [place] luggage [place] which bothered her foot. Had injections, had x-rays, nothing worked. Had to get surgery. Currently on elequis and metoprolol. Got an apple watch to watch her heart rate. Has Afib once every 24-36 hours. Has not yet had PT since her procedure. Also has an acoustic neuroma L ear which affects her balance, therefore she's using a wheel chair. Pt fell in the bathroom June 06, 2021. Hit her head but her R foot is fine. Head is ok. MD told her that she can weight bear with her CAM boot on Monday August 03, 2021. Start out walking  gingerly to see if there is pain, then gradually increase weight for 2-3 days, then midweek should be fine with full weight bearing.  PRECAUTIONS: atrial fibrillation  SUBJECTIVE:    Getting a bone stimulator. No A-fib   PAIN:  Are you having pain? 0/10     Objective   [email or web address]    Last scheduled appointment: 12/09/2021   Therapeutic exercise   HR at start of session: 60-61 bpm  Located talonavicular joint, dorsal side for pt for bone stimulator.   R ankld PF, DF   Directed patient with gait with normal gait speed, with changes in speed, 180 degree pivot turn, with R and L cervical rotation position, with cervical flexion and extension position, stepping around obstacles, stepping over an obstacle, ascending and descending 4 regular steps with UE assist   Reviewed progress with PT towards goal.   S/L hip abduction  R 5x3  L 5x3. Difficult  SLS with L UE light touch assist  R 10x5 seconds for 2 sets  Tandem walking forward 10 ft , backward 10 ft 5x  Forward step up onto and over Air Ex pad 10x2 without UE assist  Stepping over 4 mini hurdles 6x, no UE assist, SBA   Side stepping over 4 mini hurdles 2x R and L     Improved exercise technique, movement at target joints, use of target muscles after min to mod verbal, visual, tactile cues.       Response to treatment Pt tolerated session well without aggravation of symptoms.    Clinical impression Pt demonstrates improved ability to ambulate, improved balance, and function since initial evaluation. Pt has progressed very well with PT towards goals for her foot rehab. Skilled physical therapy services discharged with pt continuing her progress with her exercises at home.                 PATIENT EDUCATION: Education details: Cardiac nurse instructions Person educated: Patient and Spouse Education method: Explanation Education comprehension: verbalized understanding and returned  demonstration   HOME EXERCISE PROGRAM: Access Code: [email or web address] URL: [email or web address] Date: 11/18/2021 Prepared by: Joneen Boers  Exercises - Sidelying Hip Abduction  - 1 x daily - 7 x weekly - 3 sets - 5 reps - Supine Posterior Pelvic Tilt  - 1 x daily - 7 x weekly - 3 sets - 10 reps - 5 seconds hold - Standing Gastroc Stretch at Counter  - 2 x daily - 7 x weekly - 1 sets - 3 reps - 30 seconds hold - Heel Toe Raises with Counter Support  - 1 x daily - 7 x weekly - 3 sets - 10 reps   PT Short Term Goals - 11/18/21 1121       PT SHORT TERM GOAL #1   Title Pt will be independent with her initial HEP to improve ankle AROM, R LE strength, and ability to ambulate with less difficulty.    Baseline Pt has started her HEP (07/29/2021); Pt performing her HEP. Could not remember how to do the S/L hip abduction (10/20/2021); No questions (11/18/2021)    Time 3    Period Weeks    Status Partially Met    Target Date 11/05/21              PT Long Term Goals - 12/09/21 1642       PT LONG TERM GOAL #1   Title Pt will improve R ankle DF AROM to 15 degrees and PF to 50 degrees to promote ability to ambulate with less difficulty.    Baseline R ankle DF 5 degrees, PF 32 degrees (07/29/2021); R ankle DF 9 degrees, PF 46 degres (10/20/2021); 5 degress DF, 65 degrees PF    Time 4    Period Weeks    Status Partially Met    Target Date 12/17/21      PT LONG TERM GOAL #2   Title Pt will improve R LE strength by at least 1/2 MMT grade to promote ability to perform standing tasks and ambulate with less difficulty.    Baseline R hip flexion 4/5, extension 3+/5, abduction 4-/5, knee flexion and extension 4+/5. Ankle strength not yet tested (07/29/2021); R hip flexion 4+/5, extension 4/5, hip abduction 4+/5, knee flexion and extension 5/5, ankle DF 4+/5, ankle PF (manually resisted) 5/5 (10/20/2021)    Time 12    Period Weeks    Status Achieved    Target Date 10/22/21      PT LONG  TERM GOAL  #3   Title Pt will be able to ambulate at least 500 ft with regular shoe and no AD to promote mobility.    Baseline RW with R CAM boot, R LE NWB, 5 ft (07/29/2021); Able to ambulate > 500 ft (4 days ago) with regular shoe, no pain. (10/20/2021)    Time 12    Period Weeks    Status Achieved    Target Date 10/22/21      PT LONG TERM GOAL #4   Title Pt will improve her R foot FOTO score by at least 15 points as a demonstration of improved function.    Baseline R foot FOTO emailed to pt (score: 54 ) (07/29/2021); 65 (10/20/2021); 72 (11/18/2021); 80 (12/09/2021)    Time 12    Period Weeks    Status Achieved    Target Date 10/22/21      PT LONG TERM GOAL #5   Title Pt will improve her DGI score to > 19 as a demonnstration of improved balance and decreased fall risk.    Baseline DGI 16 (10/20/2021); 18 (11/18/2021); 21 (12/09/2021)    Time --    Period Weeks    Status Achieved    Target Date 12/17/21              Plan - 12/09/21 1629     Clinical Impression Statement Pt demonstrates improved ability to ambulate, improved balance, and function since initial evaluation. Pt has progressed very well with PT towards goals for her foot rehab. Skilled physical therapy services discharged with pt continuing her progress with her exercises at home.    Personal Factors and Comorbidities Age;Comorbidity 3+;Fitness;Time since onset of injury/illness/exacerbation    Comorbidities Acoustic neuroma L ear, anxiety, CA, depression, chronic kidney disease, HTN    Examination-Activity Limitations Bathing;Locomotion Level;Transfers;Carry;Squat;Stairs;Lift    Stability/Clinical Decision Making Stable/Uncomplicated   Afib 06/18/1476   Clinical Decision Making Low    Rehab Potential Fair    PT Frequency 2x / week    PT Duration 4 weeks    PT Treatment/Interventions Neuromuscular re-education;Gait training;Stair training;Functional mobility training;Therapeutic activities;Therapeutic exercise;Balance  training;Patient/family education;Manual techniques;Scar mobilization;Dry needling;Vestibular;Electrical Stimulation;Canalith Repostioning;Iontophoresis 1m/ml Dexamethasone    PT Next Visit Plan Ankle ROM (DF, PF), hip and knee strengthening, gait, manual techniques, modalities PRN    PT Home Exercise Plan Medbridge Access Code [email or web address]    Consulted and Agree with Plan of Care Patient             Thank you for your referral.   MJoneen BoersPT, DPT  12/09/2021, 6:20 PM

## 2021-12-11 ENCOUNTER — Telehealth: Payer: Self-pay | Admitting: *Deleted

## 2021-12-11 NOTE — Telephone Encounter (Signed)
Kathy Long w/ [employer] is needing additional information before prior approval for bone stimulator.  Xrays (non union)90 days apart -April 7 or beyond. If you do not have a peer to peer can be arranged by contacting :[phone]. Please advise.

## 2021-12-14 DIAGNOSIS — M960 Pseudarthrosis after fusion or arthrodesis: Secondary | ICD-10-CM | POA: Diagnosis not present

## 2021-12-15 ENCOUNTER — Ambulatory Visit: Payer: Medicare PPO

## 2021-12-15 DIAGNOSIS — R42 Dizziness and giddiness: Secondary | ICD-10-CM | POA: Diagnosis not present

## 2021-12-15 DIAGNOSIS — R2689 Other abnormalities of gait and mobility: Secondary | ICD-10-CM | POA: Diagnosis not present

## 2021-12-15 DIAGNOSIS — R2681 Unsteadiness on feet: Secondary | ICD-10-CM

## 2021-12-15 DIAGNOSIS — R262 Difficulty in walking, not elsewhere classified: Secondary | ICD-10-CM

## 2021-12-15 DIAGNOSIS — M6281 Muscle weakness (generalized): Secondary | ICD-10-CM | POA: Diagnosis not present

## 2021-12-15 DIAGNOSIS — M25571 Pain in right ankle and joints of right foot: Secondary | ICD-10-CM | POA: Diagnosis not present

## 2021-12-15 NOTE — Therapy (Signed)
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   Anxiety    Arthritis    lower back, hands   Cancer ([place])    melanoma, shoulder   Chronic kidney disease    Closed fracture of distal lateral malleolus of left ankle 12/28/2017   Depression    GERD (gastroesophageal reflux disease)    Hyperlipidemia    Hypertension    Postoperative bile leak    Sleep apnea    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    Past Surgical History:  Procedure Laterality Date   ANKLE FRACTURE SURGERY Left    APPENDECTOMY  2010   BACK SURGERY     disectomy lumbar, scar tissue   BREAST CYST EXCISION Right 1970   axilla   buninectomy Bilateral    CATARACT EXTRACTION W/PHACO Left 04/12/2018   Procedure: CATARACT EXTRACTION PHACO AND INTRAOCULAR LENS PLACEMENT ([place])  LEFT TORIC LENS;  Surgeon: Leandrew Koyanagi, MD;  Location: [place];  Service: Ophthalmology;  Laterality: Left;   CATARACT EXTRACTION W/PHACO Right 05/10/2018   Procedure: CATARACT EXTRACTION PHACO AND INTRAOCULAR LENS PLACEMENT ([place])  RIGHT TORIC LENS;  Surgeon: Leandrew Koyanagi, MD;  Location: [place];  Service: Ophthalmology;  Laterality: Right;  DIABETIC   CHOLECYSTECTOMY N/A 07/05/2017   Procedure: LAPAROSCOPIC CHOLECYSTECTOMY WITH INTRAOPERATIVE CHOLANGIOGRAM;  Surgeon: Robert Bellow, MD;  Location: [facility];  Service: General;   Laterality: N/A;   COLONOSCOPY     2009 and color gaurd in 2018   COLONOSCOPY N/A 03/25/2020   Procedure: COLONOSCOPY;  Surgeon: [name], MD;  Location: [facility];  Service: Endoscopy;  Laterality: N/A;   DRUG INDUCED ENDOSCOPY N/A 05/06/2021   Procedure: DRUG INDUCED SLEEP ENDOSCOPY;  Surgeon: Melida Quitter, MD;  Location: [place];  Service: ENT;  Laterality: N/A;   ERCP N/A 07/12/2017   Procedure: ENDOSCOPIC RETROGRADE CHOLANGIOPANCREATOGRAPHY (ERCP);  Surgeon: Lucilla Lame, MD;  Location: [facility] ENDOSCOPY;  Service: Endoscopy;  Laterality: N/A;   ERCP N/A 10/04/2017   Procedure: ENDOSCOPIC RETROGRADE CHOLANGIOPANCREATOGRAPHY (ERCP);  Surgeon: Lucilla Lame, MD;  Location: [facility] ENDOSCOPY;  Service: Endoscopy;  Laterality: N/A;   FOOT SURGERY     x 2   INCISIONAL HERNIA REPAIR N/A 12/04/2020   Procedure: VENTRAL INCISIONAL HERNIA REPAIR WITH MESH;  Surgeon: Armandina Gemma, MD;  Location: [facility];  Service: General;  Laterality: N/A;   MELANOMA EXCISION Left 2000   PALATOPLASTY N/A 04/08/2015   Procedure: PALATOPLASTY;  Surgeon: Beverly Gust, MD;  Location: [facility];  Service: ENT;  Laterality: N/A;   TONSILLECTOMY N/A 04/08/2015   Procedure: TONSILLECTOMY;  Surgeon: Beverly Gust, MD;  Location: [facility];  Service: ENT;  Laterality: N/A;   TUBAL LIGATION     Patient Active Problem  List   Diagnosis Date Noted   Chronic pain of right ankle 11/23/2021   Overweight (BMI 25.0-29.9) 11/23/2021   Paroxysmal atrial fibrillation ([place]) 10/09/2021   Aortic atherosclerosis ([place]) 05/12/2021   History of COVID-19 05/12/2021   Positive colorectal cancer screening using Cologuard test 02/05/2020   Esophageal dysphagia 12/11/2019   Hyperlipidemia LDL goal <100 12/10/2019   Right shoulder pain 10/17/2019   Arthritis of right acromioclavicular joint 08/15/2019   Piriformis syndrome of left side 08/15/2019   OSA (obstructive sleep apnea) 05/13/2019   CKD (chronic  kidney disease) stage 3, GFR 30-59 ml/min (HCC) 04/13/2018   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   S/P laparoscopic cholecystectomy 07/19/2017   Hospital discharge follow-up 07/19/2017   Acoustic neuroma ([place]) 06/12/2017   Colon cancer screening 03/08/2017   Nocturia 12/09/2016   Major depressive disorder, 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[place]) 06/05/2016   Encounter for preventive health examination 03/07/2015   Incisional hernia, without obstruction or gangrene 09/28/2014   Anxiety state 11/19/2013   Gastro-esophageal reflux disease without esophagitis 11/19/2013   Squamous cell carcinoma of skin of face 10/21/2011   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 Pt reports having been diagnosed with an acoustic neuroma about 6 yrs ago secondary to having balance issues. Went to physician and was sent to PT under the assumption the inner ear was the cause of the balance issues. Pt states that balance did not get any better and went back to physician and had imaging done. Per pt, scan found the neuroma and plan was to monitor it. Gets imaging each year as a result and has show no changes as of now. Has been sent to an audiologist during this time and was given hearing aids. Pt recently went to PT for ankle s/p surgery for collapsed arch in January, reports no restrictions regarding ankle for therapy and feels PT helped tremendously. During surgery, pt reports she went into A-fib and is now seeing cardiologist.  Was placed on several medications, and was having episodes of dizziness and hallucinations as side effects from one of the medications. Went back to her physician and dosages have been adjusted and reports no side effects since. Pt attributes all of these events to her balance being "off". Reports that sudden head movements can bring on the "off balance" sensation, as well as bending down. Pt reports she will get the feeling "something is off" and will notice she has veered to one side or the other.   PERTINENT HISTORY: Pt is here for vestibular PT evaluation regarding difficulties with balance and "unable to walk straight". Pt reports having been diagnosed with an acoustic neuroma in L ear about 6 yrs ago secondary to having balance issues. Went to physician and was sent to PT under the assumption the inner ear was the cause of the balance issues. Pt states that balance did not get any better and went back to physician and had imaging done. Per pt, scan found the neuroma and plan was to monitor it. Gets imaging each year as a result and has show  no changes as of now. Has been sent to an audiologist during this time and was given hearing aids. Pt recently went to PT for ankle s/p surgery for collapsed arch in January, reports no restrictions regarding ankle for therapy and feels PT helped tremendously. During surgery, pt reports she went into A-fib and is now seeing cardiologist. Was placed on several medications, and was having episodes of dizziness and hallucinations as side effects from one of the medications. Went back to her physician and dosages have been adjusted and reports no side effects since. Pt attributes all of these events to her balance being "off". Reports that sudden head movements can bring on the "off balance" sensation, as well as bending down. Pt reports she will get the feeling "something is off" and will notice she has veered to one side or the other. PMH is significant for: acoustic  neuroma, arthritis, hx of other cancer, chronic kidney disease, anxiety, depression and HTN. See chart for additional information.   PAIN:  Are you having pain? No Has a little when walking (rates as 2/10 at worst)  PRECAUTIONS: Fall  WEIGHT BEARING RESTRICTIONS No  FALLS: Has patient fallen in last 6 months? Yes. Number of falls 1; in January fell on way to bathroom following ankle surgery, got off balance and cut head open on corner of chest, did not go to the ER, talked to PCP about head injury and reports PCP had no concerns.  LIVING ENVIRONMENT: deferred to next session secondary to limited time Lives with: lives with their family Lives in: Other town house Stairs:    Has following equipment at home:     PLOF: [place]: "be able to walk straight"  OBJECTIVE:   DIAGNOSTIC FINDINGS: Taken from 11/27/2021 MR BRAIN/IAC W WO CONTRAST: "IMPRESSION: 1. 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 2. Moderate chronic microvascular ischemic changes of the white matter in mild parenchymal volume loss, stable to mildly progressed since prior MRI."  COGNITION: Overall cognitive status: Within functional limits for tasks assessed   SENSATION: deferred to next session secondary to limited time     POSTURE: rounded shoulders and forward head   Cervical ROM: formal testing deferred to next session secondary to limited time, however, pt with limited cervical rotation and stiffness with DVA and head impulse testing.  Active A/PROM (deg) eval  Flexion   Extension   Right lateral flexion   Left lateral flexion   Right rotation   Left rotation   (Blank rows = not tested)  STRENGTH: deferred to next session secondary to limited time  GAIT: Assistive device utilized: None Level of assistance: Complete Independence Comments: pt reports path deviations, unable to formally assess, plan to assess further next sessions, difficulty maintaining postural  stability with scanning/head turns per pt report  FUNCTIONAL TESTs:  Dynamic Gait Index: deferred to next session secondary to limited time  PATIENT SURVEYS:  ABC scale 64% DHI 48: indicates moderate handicap FOTO 53 (goal score 59)   VESTIBULAR ASSESSMENT     SYMPTOM BEHAVIOR: Subjective history: complaints of imbalance, difficulty with turning head. Has acoustic neuroma (see subjective history above for details).   Non-Vestibular symptoms:  wears hearing aids B   Type of dizziness: Imbalance (Disequilibrium) and Unsteady with head/body turns   Frequency: with positional changes    Duration: unclear, will clarify future session   Aggravating factors: Induced by motion: occur when walking, bending down to the ground, turning body quickly, and turning head quickly  Relieving factors:  not reported   Progression of symptoms: unchanged   OCULOMOTOR EXAM:   Ocular Alignment: normal   Ocular ROM: No Limitations   Spontaneous Nystagmus: absent   Gaze-Induced Nystagmus: absent   Smooth Pursuits:  occasional saccadic intrusions   Saccades: hypometric/undershoots   Convergence/Divergence: WNL   VESTIBULAR - OCULAR REFLEX:    Slow VOR: Comment: noted corrective saccades with guided cervical rotation/VOR   VOR Cancellation: Comment: not tested   Head-Impulse Test: HIT Left: possible corrective saccade observed, however, pt with increased cervical stiffness B, had difficulty performing test. Attempted modified version/self-performed head impulse test, but not able to achieve sufficient speed.    Dynamic Visual Acuity Test: Static: line 10, normal with no head movement Dynamic: line 6, impaired    POSITIONAL TESTING: Other: not performed, will assess further as indicated due to time limitations      OTHOSTATICS: not done due to time limitations   FUNCTIONAL GAIT:  formal testing to be performed future session   VESTIBULAR TREATMENT:  Other: no interventions provided on this  date   PATIENT EDUCATION: Education details: plan, goals, testing purpose, technique, & results; POC Person educated: Patient Education method: Explanation, Demonstration, Tactile cues, and Verbal cues Education comprehension: verbalized understanding, returned demonstration, verbal cues required, and needs further education   GOALS: Goals reviewed with patient? No  SHORT TERM GOALS: Target date: 01/26/2022   Pt will be independent in home exercise program to improve strength/mobility for better functional independence with ADLs. Baseline: 7/18: initiate next visit Goal status: INITIAL   LONG TERM GOALS: Target date: 03/09/2022    Pt will increase FOTO score to at least 59 to demonstrate significant improvement in mobility and quality of life. Baseline: 7/18: 53 Goal status: INITIAL  2.  Pt will reduce dizziness handicap inventory score to < 35, for less dizziness with ADLs and increased safety with home and community tasks. Baseline: 7/18: 48 Goal status: INITIAL  3.  Pt will increase ABC scale score > 80% to demonstrate better functional mobility and better confidence with ADLs. Baseline: 7/18: 64% Goal status: INITIAL  4.  Pt will increase FGA score to > 20/30 as to reduce fall risk and improve dynamic gait safety with community ambulation.  Baseline: 7/18: deferred to next session Goal status: INITIAL   ASSESSMENT:  CLINICAL IMPRESSION: Patient is a 71 y.o. pleasant female who was seen today for physical therapy evaluation and treatment for imbalance and dizziness. Evaluation reveals deficits in cervical ROM, gait, mobility, balance, dynamic visual acuity, and VOR. Pt also with possible impaired smooth pursuits (see above). Per pt reported dizziness is not a current concern, however, she can feel "off" with particular head movements, indicating impaired postural stability with scanning. Further assessment indicated. The pt will benefit from skilled PT to address these  deficits, decrease fall risk, and improve QoL.   OBJECTIVE IMPAIRMENTS Abnormal gait, decreased activity tolerance, decreased balance, decreased coordination, decreased endurance, decreased knowledge of use of DME, decreased mobility, difficulty walking, decreased ROM, dizziness, hypomobility, impaired vision/preception, improper body mechanics, and postural dysfunction.   ACTIVITY LIMITATIONS bending, sitting, standing, squatting, stairs, transfers, dressing, locomotion level, and caring for others  PARTICIPATION LIMITATIONS: cleaning, laundry, interpersonal relationship, shopping, community activity, and yard work  PERSONAL FACTORS Age, Past/current experiences, Sex, Time since onset of injury/illness/exacerbation, and 3+ comorbidities: acoustic neuroma, arthritis, chronic kidney disease, anxiety, depression, hx of other cancer, and HTN  are also affecting patient's functional outcome.   REHAB POTENTIAL: Good  CLINICAL  DECISION MAKING: Evolving/moderate complexity  EVALUATION COMPLEXITY: Moderate   PLAN: PT FREQUENCY: 2x/week  PT DURATION: 12 weeks  PLANNED INTERVENTIONS: Therapeutic exercises, Therapeutic activity, Neuromuscular re-education, Balance training, Gait training, Patient/Family education, Self Care, Joint mobilization, Stair training, Vestibular training, Canalith repositioning, Visual/preceptual remediation/compensation, DME instructions, Dry Needling, Electrical stimulation, Spinal mobilization, Cryotherapy, Moist heat, Compression bandaging, Taping, Manual therapy, and Re-evaluation  PLAN FOR NEXT SESSION: further assessment, VOR training, balance    Izola Price, SPT This entire session was performed under direct supervision and direction of a licensed Chiropractor . I have personally read, edited and approve of the note as written. Ricard Dillon PT, DPT    Izola Price, Student-PT 12/15/2021, 12:31 PM

## 2021-12-15 NOTE — Telephone Encounter (Signed)
She is ok with a verbal that of the xrays taken of non union

## 2021-12-21 ENCOUNTER — Telehealth: Payer: Self-pay | Admitting: Internal Medicine

## 2021-12-21 ENCOUNTER — Telehealth: Payer: Self-pay | Admitting: *Deleted

## 2021-12-21 DIAGNOSIS — J209 Acute bronchitis, unspecified: Secondary | ICD-10-CM | POA: Diagnosis not present

## 2021-12-21 DIAGNOSIS — H8309 Labyrinthitis, unspecified ear: Secondary | ICD-10-CM | POA: Diagnosis not present

## 2021-12-21 DIAGNOSIS — J019 Acute sinusitis, unspecified: Secondary | ICD-10-CM | POA: Diagnosis not present

## 2021-12-21 DIAGNOSIS — B9689 Other specified bacterial agents as the cause of diseases classified elsewhere: Secondary | ICD-10-CM | POA: Diagnosis not present

## 2021-12-21 DIAGNOSIS — H66001 Acute suppurative otitis media without spontaneous rupture of ear drum, right ear: Secondary | ICD-10-CM | POA: Diagnosis not present

## 2021-12-21 NOTE — Telephone Encounter (Signed)
Patient called experiencing dizziness and spinning starting yesterday with a brief episode this morning.  Call was transferred to Access Nurse -  Vaughan Basta

## 2021-12-21 NOTE — Telephone Encounter (Signed)
Pt went to [facility] in.

## 2021-12-21 NOTE — Telephone Encounter (Signed)
Returned call to patient giving recommendations/instructions of where to place stimulator, verbalized understanding.

## 2021-12-21 NOTE — Telephone Encounter (Signed)
Patient is calling because she has  received her stimulator today,wanted to know where on the foot she is supposed to place the stimulator.   Returned the call back to patient , no answer, left message to call back.

## 2021-12-22 ENCOUNTER — Ambulatory Visit: Payer: Medicare PPO

## 2021-12-24 ENCOUNTER — Telehealth: Payer: Self-pay | Admitting: Internal Medicine

## 2021-12-24 NOTE — Telephone Encounter (Signed)
LMTCB to follow up with pt.  

## 2021-12-24 NOTE — Telephone Encounter (Signed)
Patient hung up before front office could advise.

## 2021-12-24 NOTE — Telephone Encounter (Signed)
LMTCB

## 2021-12-24 NOTE — Telephone Encounter (Signed)
[place] she was sick and not feeling well, she talked to the nurse line went to walk in clinic , emerging ear and sinus infection, was giving medications.  [place] the [place] BP Pressure went up ....highest is 173/93 (last night @ 835 pm  lowest 154/92 (this morning)  Pulse is fine.   Wednesday was dizzy   -  Called Access nurse spoke with Chasity - she said that someone will give the patient a call.

## 2021-12-25 ENCOUNTER — Encounter: Payer: Self-pay | Admitting: Cardiovascular Disease

## 2021-12-25 NOTE — Telephone Encounter (Signed)
LMTCB

## 2021-12-27 ENCOUNTER — Other Ambulatory Visit: Payer: Self-pay | Admitting: Internal Medicine

## 2021-12-28 ENCOUNTER — Encounter: Payer: Self-pay | Admitting: Internal Medicine

## 2021-12-28 NOTE — Telephone Encounter (Signed)
Unable to reach pt

## 2021-12-29 MED ORDER — MOUNJARO 5 MG/0.5ML ~~LOC~~ SOAJ: SUBCUTANEOUS | 0 refills | Status: DC

## 2021-12-30 ENCOUNTER — Ambulatory Visit: Payer: Medicare PPO

## 2022-01-01 ENCOUNTER — Encounter: Payer: Self-pay | Admitting: Internal Medicine

## 2022-01-01 ENCOUNTER — Ambulatory Visit: Payer: Medicare PPO | Admitting: Internal Medicine

## 2022-01-01 ENCOUNTER — Ambulatory Visit: Payer: Medicare PPO

## 2022-01-01 VITALS — BP 146/86 | HR 64 | Temp 97.6°F | Ht 62.0 in | Wt 147.2 lb

## 2022-01-01 DIAGNOSIS — G4733 Obstructive sleep apnea (adult) (pediatric): Secondary | ICD-10-CM

## 2022-01-01 DIAGNOSIS — E663 Overweight: Secondary | ICD-10-CM | POA: Diagnosis not present

## 2022-01-01 DIAGNOSIS — I10 Essential (primary) hypertension: Secondary | ICD-10-CM | POA: Diagnosis not present

## 2022-01-01 MED ORDER — ALPRAZOLAM 0.25 MG PO TABS: 0.2500 mg | ORAL_TABLET | Freq: Two times a day (BID) | ORAL | 0 refills | Status: DC | PRN

## 2022-01-01 MED ORDER — AMLODIPINE BESYLATE 2.5 MG PO TABS: 2.5000 mg | ORAL_TABLET | Freq: Every day | ORAL | 1 refills | Status: DC

## 2022-01-01 NOTE — Patient Instructions (Addendum)
Stop the collagen.  It may be interfering with the flecainide  I think your morning hallucinations may be caused by the sleep apnea, so I have ordered  home sleep study   Start the amlodipine 2.5 mg daily starting tonight AND Take a xanax if you feel that the stress is driving it up   You may take an extra 25 mg metoprolol for HR > 100 and/or BP > 160/90

## 2022-01-01 NOTE — Progress Notes (Signed)
Subjective:  Patient ID: Kathy Long, female    DOB: 05/11/1951  Age: 71 y.o. MRN: [id number]  CC: The primary encounter diagnosis was OSA (obstructive sleep apnea). Diagnoses of Overweight (BMI 25.0-29.9) and Essential hypertension were also pertinent to this visit.   HPI Kathy Long presents for No chief complaint on file.   1) uncontrolled atrial fibrillation:  patient reports increased frequency of episodes  of atrial fib despite titration of flecainide  which has been  limited by recurrent  visual hallucinations during early morning during sleep,  Also had several episodes of vertigo that occurred early [place] that resolved with change in position . Still having visual hallucinations early morning . Not using CPAP (waking up with headaches) because she does not tolerate mask and no longer snores since losing weight.   Had a diagnostic test which qualified her for "INSPIRE"    2)  Stress test was attempted during workup but limited  by patient fatigue.  Did not have chest pain or dyspnea.    4) HTN:  Previous reports of elevated bp at home were not seen by [facility].  But readings [place] the beach were again elevated to 160/90.   Today's readings 165/93 right arm.  161/95.  Reading by nurse was 146/86 right arm    Outpatient Medications Prior to Visit  Medication Sig Dispense Refill   apixaban (ELIQUIS) 5 MG TABS tablet Take 1 tablet (5 mg total) by mouth 2 (two) times daily. 180 tablet 3   Cholecalciferol (VITAMIN D3) 75 MCG (3000 UT) TABS Take 6,000 Units by mouth daily.     citalopram (CELEXA) 20 MG tablet TAKE 1 TABLET BY MOUTH DAILY 90 tablet 1   clobetasol (TEMOVATE) 0.05 % external solution APPLY DAILY UNTIL RASH IS CLEAR 50 mL 0   Coenzyme Q10 100 MG capsule Take 100 mg by mouth daily.     COLLAGEN PO Take 1 tablet by mouth daily.     flecainide (TAMBOCOR) 100 MG tablet Take 100 mg (1 tablet) in the morning and 50 mg (1/2 tablet) in the  evening. 126 tablet 3   melatonin 5 MG TABS Take 5 mg by mouth at bedtime.     metoprolol succinate (TOPROL-XL) 50 MG 24 hr tablet Take 1 tablet (50 mg total) by mouth daily. 90 tablet 3   omeprazole (PRILOSEC) 40 MG capsule TAKE 1 CAPSULE BY MOUTH ONCE DAILY 90 capsule 1   ondansetron (ZOFRAN) 4 MG tablet TAKE 1 TABLET BY MOUTH EVERY 8 HOURS AS NEEDED FOR NAUSEA AND VOMITING 20 tablet 0   rosuvastatin (CRESTOR) 10 MG tablet TAKE ONE TABLET BY MOUTH EVERY DAY 90 tablet 1   UNABLE TO FIND Take 1 tablet by mouth daily. Med Name: [employer] Fruit [employer]gie Vitamin     tirzepatide [facility]) 5 MG/0.5ML Pen INJECT '5MG'$  SUBCUTANEOUSLY ONCE A WEEK 2 mL 0   No facility-administered medications prior to visit.    Review of Systems;  Patient denies headache, fevers, malaise, unintentional weight loss, skin rash, eye pain, sinus congestion and sinus pain, sore throat, dysphagia,  hemoptysis , cough, dyspnea, wheezing, chest pain, palpitations, orthopnea, edema, abdominal pain, nausea, melena, diarrhea, constipation, flank pain, dysuria, hematuria, urinary  Frequency, nocturia, numbness, tingling, seizures,  Focal weakness, Loss of consciousness,  Tremor, insomnia, depression, anxiety, and suicidal ideation.      Objective:  BP (!) 146/86 (BP Location: Left Arm, Patient Position: Sitting, Cuff Size: Normal)  Pulse 64   Temp 97.6 F (36.4 C) (Oral)   Ht '5\' 2"'$  (1.575 m)   Wt 147 lb 3.2 oz (66.8 kg)   SpO2 99%   BMI 26.92 kg/m   BP Readings from Last 3 Encounters:  01/01/22 (!) 146/86  11/23/21 136/82  10/09/21 132/88    Wt Readings from Last 3 Encounters:  01/01/22 147 lb 3.2 oz (66.8 kg)  11/23/21 147 lb 12.8 oz (67 kg)  10/09/21 151 lb (68.5 kg)    General appearance: alert, cooperative and appears stated age Ears: normal TM's and external ear canals both ears Throat: lips, mucosa, and tongue normal; teeth and gums normal Neck: no adenopathy, no carotid bruit, supple, symmetrical,  trachea midline and thyroid not enlarged, symmetric, no tenderness/mass/nodules Back: symmetric, no curvature. ROM normal. No CVA tenderness. Lungs: clear to auscultation bilaterally Heart: regular rate and rhythm, S1, S2 normal, no murmur, click, rub or gallop Abdomen: soft, non-tender; bowel sounds normal; no masses,  no organomegaly Pulses: 2+ and symmetric Skin: Skin color, texture, turgor normal. No rashes or lesions Lymph nodes: Cervical, supraclavicular, and axillary nodes normal.  Lab Results  Component Value Date   HGBA1C 5.4 10/09/2021   HGBA1C 5.0 03/10/2021   HGBA1C 5.8 02/28/2019    Lab Results  Component Value Date   CREATININE 1.22 (H) 11/23/2021   CREATININE 1.06 (H) 10/09/2021   CREATININE 1.30 (H) 04/30/2021    Lab Results  Component Value Date   WBC 5.4 03/10/2021   HGB 13.6 03/10/2021   HCT 43 03/10/2021   PLT 196 03/10/2021   GLUCOSE 76 11/23/2021   CHOL 140 10/09/2021   TRIG 117 10/09/2021   HDL 48 (L) 10/09/2021   LDLCALC 72 10/09/2021   ALT 24 11/23/2021   AST 22 11/23/2021   NA 142 11/23/2021   K 4.0 11/23/2021   CL 104 11/23/2021   CREATININE 1.22 (H) 11/23/2021   BUN 19 11/23/2021   CO2 29 11/23/2021   TSH 1.17 03/10/2021   INR 0.91 10/23/2009   HGBA1C 5.4 10/09/2021   MICROALBUR 1.3 10/09/2021    CT FOOT RIGHT WO CONTRAST  Result Date: 12/06/2021 CLINICAL DATA:  Status post arthrodesis.  Evaluate for bone healing. EXAM: CT OF THE RIGHT FOOT WITHOUT CONTRAST TECHNIQUE: Multidetector CT imaging of the right foot was performed according to the standard protocol. Multiplanar CT image reconstructions were also generated. RADIATION DOSE REDUCTION: This exam was performed according to the departmental dose-optimization program which includes automated exposure control, adjustment of the mA and/or kV according to patient size and/or use of iterative reconstruction technique. COMPARISON:  11/23/2021 FINDINGS: Bones/Joint/Cartilage No acute fracture  or dislocation. Posterior subtalar arthrodesis transfixed with 2 cannulated screws and osseous bridging across the joint space. Talonavicular arthrodesis transfixed with a medial and lateral stable and areas of bone bridging. Small area of persistent joint space without bone bridging along the dorsal aspect. 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 Subchondral cystic changes along the medial corner of the talar dome likely reflecting an osteochondral lesion. Normal alignment. No joint effusion. Severe osteopenia. Healed first metatarsal osteotomy. Small screw in the second metatarsal head. Arthritic changes of the articulation of the first metatarsal head-hallux sesamoid. Small plantar calcaneal spur. Ligaments Ligaments are suboptimally evaluated by CT. Muscles and Tendons Muscles are normal. No muscle atrophy. No intramuscular fluid collection or hematoma. Flexor, extensor, peroneal and Achilles tendons are intact. Soft tissue No fluid collection or hematoma.  No soft  tissue mass. IMPRESSION: 1. Posterior subtalar arthrodesis transfixed with 2 cannulated screws and osseous bridging across the joint space. 2. Talonavicular arthrodesis transfixed with a medial and lateral stable and areas of bone bridging. Small area of persistent joint space without bone bridging along the dorsal aspect. 3. Mild osteoarthritis of the calcaneocuboid joint. 4. 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 5. Subchondral cystic changes along the medial corner of the talar dome likely reflecting an osteochondral lesion. Electronically Signed   By: Kathreen Devoid M.D.   On: 12/06/2021 07:12    Assessment & Plan:   Problem List Items Addressed This Visit     Overweight (BMI 25.0-29.9)    Her weight has plateaued  On current 5 mg dose of Mounjaro.  She is not exercising.  She has requested an  Increase in dose to 7.5 mg      OSA (obstructive sleep apnea)  - Primary    Currently untreated due to mask intolerance.. however she is having early morning hallucinations,  Uncontrolled atrial fibrillation ,  And early morning vertigo.  Home sleep study ordered to confirm that the OSA is still present despite the weight loss       Relevant Orders   Home sleep test   Essential hypertension    Elevated .  Resume amlodipine 2.5 mg daily       Relevant Medications   amLODipine (NORVASC) 2.5 MG tablet    I spent a total of 45 minutes with this patient in a face to face visit on the date of this encounter reviewing the last office visit with me ,  most recent visit with patient's cardiologist in ,  patient'ss diet and eating habits, home blood pressure readings ,  most recent imaging study ,   and post visit ordering of testing and therapeutics.    Follow-up: No follow-ups on file.   [name], MD

## 2022-01-02 ENCOUNTER — Other Ambulatory Visit: Payer: Self-pay | Admitting: Internal Medicine

## 2022-01-03 MED ORDER — TIRZEPATIDE 7.5 MG/0.5ML ~~LOC~~ SOAJ: 7.5000 mg | SUBCUTANEOUS | 0 refills | Status: DC

## 2022-01-03 NOTE — Assessment & Plan Note (Signed)
Currently untreated due to mask intolerance.. however she is having early morning hallucinations,  Uncontrolled atrial fibrillation ,  And early morning vertigo.  Home sleep study ordered to confirm that the OSA is still present despite the weight loss

## 2022-01-03 NOTE — Assessment & Plan Note (Signed)
Elevated .  Resume amlodipine 2.5 mg daily

## 2022-01-03 NOTE — Assessment & Plan Note (Signed)
Her weight has plateaued  On current 5 mg dose of Mounjaro.  She is not exercising.  She has requested an  Increase in dose to 7.5 mg

## 2022-01-04 ENCOUNTER — Other Ambulatory Visit: Payer: Self-pay | Admitting: Internal Medicine

## 2022-01-05 ENCOUNTER — Telehealth: Payer: Self-pay | Admitting: Internal Medicine

## 2022-01-05 NOTE — Telephone Encounter (Signed)
Copied from [place] [id number]. Topic: Medicare AWV >> Jan 05, 2022  1:58 PM Devoria Glassing wrote: Reason for CRM: Left message for patient to schedule Annual Wellness Visit.  Please schedule with Nurse Health Advisor Denisa O'Brien-Blaney, LPN at [facility]. This appt can be telephone or office visit.  Please call [phone] ask for [facility]

## 2022-01-06 ENCOUNTER — Ambulatory Visit: Payer: Medicare PPO | Attending: Internal Medicine

## 2022-01-06 DIAGNOSIS — R262 Difficulty in walking, not elsewhere classified: Secondary | ICD-10-CM | POA: Diagnosis not present

## 2022-01-06 DIAGNOSIS — M6281 Muscle weakness (generalized): Secondary | ICD-10-CM | POA: Insufficient documentation

## 2022-01-06 DIAGNOSIS — R2681 Unsteadiness on feet: Secondary | ICD-10-CM | POA: Insufficient documentation

## 2022-01-06 DIAGNOSIS — R2689 Other abnormalities of gait and mobility: Secondary | ICD-10-CM | POA: Insufficient documentation

## 2022-01-06 DIAGNOSIS — R42 Dizziness and giddiness: Secondary | ICD-10-CM | POA: Diagnosis not present

## 2022-01-06 DIAGNOSIS — M542 Cervicalgia: Secondary | ICD-10-CM | POA: Insufficient documentation

## 2022-01-06 NOTE — Therapy (Signed)
OUTPATIENT PHYSICAL THERAPY VESTIBULAR TREATMENT     Patient Name: Kathy Long MRN: [id number] DOB:20-Oct-1950, 71 y.o., female Today's Date: 01/06/2022  PCP: [name], MD REFERRING PROVIDER: [name], MD   PT End of Session - 01/06/22 1838     Visit Number 2    Number of Visits 25    Date for PT Re-Evaluation 03/09/22    PT Start Time 3267    PT Stop Time 0929    PT Time Calculation (min) 42 min    Equipment Utilized During Treatment Gait belt    Activity Tolerance Patient tolerated treatment well    Behavior During Therapy [facility] for tasks assessed/performed              Past Medical History:  Diagnosis Date   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   Anxiety    Arthritis    lower back, hands   Cancer ([place])    melanoma, shoulder   Chronic kidney disease    Closed fracture of distal lateral malleolus of left ankle 12/28/2017   Depression    GERD (gastroesophageal reflux disease)    Hyperlipidemia    Hypertension    Postoperative bile leak    Sleep apnea    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    Past Surgical History:  Procedure Laterality Date   ANKLE FRACTURE SURGERY Left    APPENDECTOMY  2010   BACK SURGERY     disectomy lumbar, scar tissue   BREAST CYST EXCISION Right 1970   axilla   buninectomy Bilateral    CATARACT EXTRACTION W/PHACO Left 04/12/2018   Procedure: CATARACT EXTRACTION PHACO AND INTRAOCULAR LENS PLACEMENT ([place])  LEFT TORIC LENS;  Surgeon: Leandrew Koyanagi, MD;  Location: [facility];  Service: Ophthalmology;  Laterality: Left;   CATARACT EXTRACTION W/PHACO Right 05/10/2018   Procedure: CATARACT EXTRACTION PHACO AND INTRAOCULAR LENS PLACEMENT ([place])  RIGHT TORIC LENS;  Surgeon: Leandrew Koyanagi, MD;  Location: [facility];  Service: Ophthalmology;  Laterality: Right;  DIABETIC   CHOLECYSTECTOMY N/A 07/05/2017   Procedure: LAPAROSCOPIC CHOLECYSTECTOMY WITH INTRAOPERATIVE CHOLANGIOGRAM;  Surgeon: Robert Bellow, MD;  Location: [facility];  Service: General;  Laterality: N/A;   COLONOSCOPY     2009 and color gaurd in 2018   COLONOSCOPY N/A 03/25/2020   Procedure: COLONOSCOPY;  Surgeon: [name], MD;  Location: [facility];  Service: Endoscopy;  Laterality: N/A;   DRUG INDUCED ENDOSCOPY N/A 05/06/2021   Procedure: DRUG INDUCED SLEEP ENDOSCOPY;  Surgeon: Melida Quitter, MD;  Location: [facility];  Service: ENT;  Laterality: N/A;   ERCP N/A 07/12/2017   Procedure: ENDOSCOPIC RETROGRADE CHOLANGIOPANCREATOGRAPHY (ERCP);  Surgeon: Lucilla Lame, MD;  Location: [facility];  Service: Endoscopy;  Laterality: N/A;   ERCP N/A 10/04/2017   Procedure: ENDOSCOPIC RETROGRADE CHOLANGIOPANCREATOGRAPHY (ERCP);  Surgeon: Lucilla Lame, MD;  Location: [facility];  Service: Endoscopy;  Laterality: N/A;   FOOT SURGERY     x 2   INCISIONAL HERNIA REPAIR N/A 12/04/2020   Procedure: VENTRAL INCISIONAL HERNIA REPAIR WITH MESH;  Surgeon: Armandina Gemma, MD;  Location: [facility];  Service: General;  Laterality: N/A;   MELANOMA EXCISION Left 2000   PALATOPLASTY N/A 04/08/2015   Procedure: PALATOPLASTY;  Surgeon: Beverly Gust, MD;  Location: [facility];  Service: ENT;  Laterality: N/A;   TONSILLECTOMY N/A 04/08/2015   Procedure: TONSILLECTOMY;  Surgeon: Beverly Gust, MD;  Location: [facility];  Service: ENT;  Laterality: N/A;  TUBAL LIGATION     Patient Active Problem List   Diagnosis Date Noted   Chronic pain of right ankle 11/23/2021   Overweight (BMI 25.0-29.9) 11/23/2021   Paroxysmal atrial fibrillation ([place]) 10/09/2021   Aortic atherosclerosis ([place]) 05/12/2021   History of COVID-19 05/12/2021   Positive colorectal cancer screening using Cologuard test 02/05/2020   Esophageal dysphagia 12/11/2019   Hyperlipidemia LDL goal <100 12/10/2019   Right shoulder pain 10/17/2019   Arthritis of right acromioclavicular joint 08/15/2019   Piriformis syndrome of left side 08/15/2019   OSA  (obstructive sleep apnea) 05/13/2019   CKD (chronic kidney disease) stage 3, GFR 30-59 ml/min (HCC) 04/13/2018   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   S/P laparoscopic cholecystectomy 07/19/2017   Hospital discharge follow-up 07/19/2017   Acoustic neuroma ([place]) 06/12/2017   Colon cancer screening 03/08/2017   Nocturia 12/09/2016   Major depressive disorder, 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[place]) 06/05/2016   Encounter for preventive health examination 03/07/2015   Incisional hernia, without obstruction or gangrene 09/28/2014   Anxiety state 11/19/2013   Gastro-esophageal reflux disease without esophagitis 11/19/2013   Squamous cell carcinoma of skin of face 10/21/2011   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 Prior to her trip she reports diastolic was 97. She called her primary care physician. She reports she was found to have a sinus infection and was given antibiotics. Then on her beach trip she woke up one morning and the room was spinning. This has since resolved. Pt then followed up again with her doctor and was told if BP goes up to 160/100 to go to walk-in clinic. This has not happened. She had another follow-up and had her medications updated. Reports she has felt better since med changes. Pt reports she has been prescribed medication to treat anxiety.  Reports dizziness is gone, but feels like she's "down in a tunnel," pressure in her ears. Reports ongoing neck pain.  PAIN:  Are you having pain? Ongoing neck pain   PERTINENT HISTORY: Pt is here for vestibular PT  evaluation regarding difficulties with balance and "unable to walk straight". Pt reports having been diagnosed with an acoustic neuroma in L ear about 6 yrs ago secondary to having balance issues. Went to physician and was sent to PT under the assumption the inner ear was the cause of the balance issues. Pt states that balance did not get any better and went back to physician and had imaging done. Per pt, scan found the neuroma and plan was to monitor it. Gets imaging each year as a result and has show no changes as of now. Has been sent to an audiologist during this time and was given hearing aids. Pt recently went to PT for ankle s/p surgery for collapsed arch in January, reports no restrictions regarding ankle for therapy and feels PT helped tremendously. During surgery, pt reports she went into A-fib and is now seeing cardiologist. Was placed on several medications, and was having episodes of dizziness and hallucinations as side effects from one of the medications. Went back to her physician and dosages have been adjusted and reports no side  effects since. Pt attributes all of these events to her balance being "off". Reports that sudden head movements can bring on the "off balance" sensation, as well as bending down. Pt reports she will get the feeling "something is off" and will notice she has veered to one side or the other. PMH is significant for: acoustic neuroma, arthritis, hx of other cancer, chronic kidney disease, anxiety, depression and HTN. See chart for additional information.     PRECAUTIONS: Fall  WEIGHT BEARING RESTRICTIONS No  FALLS: Has patient fallen in last 6 months? Yes. Number of falls 1; in January fell on way to bathroom following ankle surgery, got off balance and cut head open on corner of chest, did not go to the ER, talked to PCP about head injury and reports PCP had no concerns.  LIVING ENVIRONMENT: deferred to next session secondary to limited time Lives with: lives with  their family Lives in: Other town house Stairs:    Has following equipment at home:     PLOF: [place]: "be able to walk straight"  OBJECTIVE:  Objective data taken at eval unless otherwise specified   DIAGNOSTIC FINDINGS: Taken from 11/27/2021 MR BRAIN/IAC W WO CONTRAST: "IMPRESSION: 1. 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 2. Moderate chronic microvascular ischemic changes of the white matter in mild parenchymal volume loss, stable to mildly progressed since prior MRI."  COGNITION: Overall cognitive status: Within functional limits for tasks assessed    POSTURE: rounded shoulders and forward head   GAIT: Assistive device utilized: None Level of assistance: Complete Independence Comments: pt reports path deviations, unable to formally assess, plan to assess further next sessions, difficulty maintaining postural stability with scanning/head turns per pt report  PATIENT SURVEYS:  ABC scale 64% DHI 48: indicates moderate handicap FOTO 53 (goal score 59)   VESTIBULAR ASSESSMENT     SYMPTOM BEHAVIOR: Subjective history: complaints of imbalance, difficulty with turning head. Has acoustic neuroma (see subjective history above for details).   Non-Vestibular symptoms:  wears hearing aids B   Type of dizziness: Imbalance (Disequilibrium) and Unsteady with head/body turns   Frequency: with positional changes    Duration: unclear, will clarify future session   Aggravating factors: Induced by motion: occur when walking, bending down to the ground, turning body quickly, and turning head quickly   Relieving factors:  not reported   Progression of symptoms: unchanged   OCULOMOTOR EXAM:   Ocular Alignment: normal   Ocular ROM: No Limitations   Spontaneous Nystagmus: absent   Gaze-Induced Nystagmus: absent   Smooth Pursuits:  occasional saccadic intrusions   Saccades: hypometric/undershoots   Convergence/Divergence: WNL   VESTIBULAR - OCULAR  REFLEX:    Slow VOR: Comment: noted corrective saccades with guided cervical rotation/VOR   VOR Cancellation: Comment: not tested   Head-Impulse Test: HIT Left: possible corrective saccade observed, however, pt with increased cervical stiffness B, had difficulty performing test. Attempted modified version/self-performed head impulse test, but not able to achieve sufficient speed.    Dynamic Visual Acuity Test: Static: line 10, normal with no head movement Dynamic: line 6, impaired    VESTIBULAR TREATMENT: Today's treatment 01/06/2022  BP test: Seated LUE: 123/70 mmHg   Dix Hallpike negative B, no symptoms  Roll test: negative B, no symptoms  Comments: would benefit from fixation-suppressed at future data if symptoms return  Standing VORx1 vertical and horizontal head turns 2x30 sec of each. Dizziness reaches 3/10 with horizontal head turns. Requires rest break. No dizziness on  this date with vert head turns Provided seated VORx1 as part of HEP (see below)  DGI: 21/24  PATIENT EDUCATION: Education details: further assessment completed, HEP provided, explained indications/results of testing, technique with exercise Person educated: Patient Education method: Explanation, Demonstration, Tactile cues, Verbal cues, and Handouts Education comprehension: verbalized understanding, returned demonstration, verbal cues required, and needs further education   GOALS: Goals reviewed with patient? No  SHORT TERM GOALS: Target date: 02/17/2022   Pt will be independent in home exercise program to improve strength/mobility for better functional independence with ADLs. Baseline: 7/18: initiate next visit; 8/9: initiated VORx1 seated Goal status: INITIAL   LONG TERM GOALS: Target date: 03/31/2022    Pt will increase FOTO score to at least 59 to demonstrate significant improvement in mobility and quality of life. Baseline: 7/18: 53 Goal status: INITIAL  2.  Pt will reduce dizziness handicap  inventory score to < 35, for less dizziness with ADLs and increased safety with home and community tasks. Baseline: 7/18: 48 Goal status: INITIAL  3.  Pt will increase ABC scale score > 80% to demonstrate better functional mobility and better confidence with ADLs. Baseline: 7/18: 64% Goal status: INITIAL  4.  Pt will increase DGI score to at least 23/24 as to reduce fall risk and improve dynamic gait safety with community ambulation.  Baseline: 7/18: deferred to next session; 8/9: revised from FGA to DGI. Pt scored 21/24 on this date Goal status: REVISED  HOME EXERCISE PROGRAM: 01/06/22: Seated VORx1 vertical and horizontal head turns 3 minutes total of each performed in up to 60 second bouts. Instructed to rest if dizziness reaches >2/10.   ASSESSMENT:  CLINICAL IMPRESSION: Completed further testing on this date. Pt with negative [employer] and Roll testing B. However, may benefit from future retesting with fixation suppressed as indicated. Pt scored 22/24 on the DGI, where she exhibited greatest instability with ambulating with horizontal head turns. VORx1 with horizontal head turns also reproduced dizzy symptoms; this was added to her HEP (seated). Pt did report she is still dealing with ongoing neck pain, which should be further addressed future sessions. The pt will benefit from skilled PT to address these deficits, decrease fall risk, and improve QoL.   OBJECTIVE IMPAIRMENTS Abnormal gait, decreased activity tolerance, decreased balance, decreased coordination, decreased endurance, decreased knowledge of use of DME, decreased mobility, difficulty walking, decreased ROM, dizziness, hypomobility, impaired vision/preception, improper body mechanics, and postural dysfunction.   ACTIVITY LIMITATIONS bending, sitting, standing, squatting, stairs, transfers, dressing, locomotion level, and caring for others  PARTICIPATION LIMITATIONS: cleaning, laundry, interpersonal relationship, shopping,  community activity, and yard work  PERSONAL FACTORS Age, Past/current experiences, Sex, Time since onset of injury/illness/exacerbation, and 3+ comorbidities: acoustic neuroma, arthritis, chronic kidney disease, anxiety, depression, hx of other cancer, and HTN  are also affecting patient's functional outcome.   REHAB POTENTIAL: Good  CLINICAL DECISION MAKING: Evolving/moderate complexity  EVALUATION COMPLEXITY: Moderate   PLAN: PT FREQUENCY: 2x/week  PT DURATION: 12 weeks  PLANNED INTERVENTIONS: Therapeutic exercises, Therapeutic activity, Neuromuscular re-education, Balance training, Gait training, Patient/Family education, Self Care, Joint mobilization, Stair training, Vestibular training, Canalith repositioning, Visual/preceptual remediation/compensation, DME instructions, Dry Needling, Electrical stimulation, Spinal mobilization, Cryotherapy, Moist heat, Compression bandaging, Taping, Manual therapy, and Re-evaluation  PLAN FOR NEXT SESSION: cervical spine ROM assessment, manual therapy to address c-spine pain, balance training, MMT LE and UE and strengthening as indicated      Zollie Pee, PT 01/06/2022, 6:53 PM

## 2022-01-08 ENCOUNTER — Ambulatory Visit: Payer: Medicare PPO

## 2022-01-08 DIAGNOSIS — M6281 Muscle weakness (generalized): Secondary | ICD-10-CM | POA: Diagnosis not present

## 2022-01-08 DIAGNOSIS — R42 Dizziness and giddiness: Secondary | ICD-10-CM | POA: Diagnosis not present

## 2022-01-08 DIAGNOSIS — R2689 Other abnormalities of gait and mobility: Secondary | ICD-10-CM | POA: Diagnosis not present

## 2022-01-08 DIAGNOSIS — M542 Cervicalgia: Secondary | ICD-10-CM

## 2022-01-08 DIAGNOSIS — R2681 Unsteadiness on feet: Secondary | ICD-10-CM

## 2022-01-08 DIAGNOSIS — R262 Difficulty in walking, not elsewhere classified: Secondary | ICD-10-CM

## 2022-01-08 NOTE — Therapy (Signed)
OUTPATIENT PHYSICAL THERAPY VESTIBULAR TREATMENT     Patient Name: Kathy Long MRN: [id number] DOB:1951/04/21, 71 y.o., female Today's Date: 01/08/2022  PCP: [name], MD REFERRING PROVIDER: [name], MD   PT End of Session - 01/08/22 0933     Visit Number 3    Number of Visits 25    Date for PT Re-Evaluation 03/09/22    PT Start Time 0932    PT Stop Time 2878    PT Time Calculation (min) 43 min    Equipment Utilized During Treatment Gait belt    Activity Tolerance Patient tolerated treatment well    Behavior During Therapy WFL for tasks assessed/performed              Past Medical History:  Diagnosis Date   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   Anxiety    Arthritis    lower back, hands   Cancer ([place])    melanoma, shoulder   Chronic kidney disease    Closed fracture of distal lateral malleolus of left ankle 12/28/2017   Depression    GERD (gastroesophageal reflux disease)    Hyperlipidemia    Hypertension    Postoperative bile leak    Sleep apnea    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    Past Surgical History:  Procedure Laterality Date   ANKLE FRACTURE SURGERY Left    APPENDECTOMY  2010   BACK SURGERY     disectomy lumbar, scar tissue   BREAST CYST EXCISION Right 1970   axilla   buninectomy Bilateral    CATARACT EXTRACTION W/PHACO Left 04/12/2018   Procedure: CATARACT EXTRACTION PHACO AND INTRAOCULAR LENS PLACEMENT ([place])  LEFT TORIC LENS;  Surgeon: Leandrew Koyanagi, MD;  Location: [place];  Service: Ophthalmology;  Laterality: Left;   CATARACT EXTRACTION W/PHACO Right 05/10/2018   Procedure: CATARACT EXTRACTION PHACO AND INTRAOCULAR LENS PLACEMENT ([place])  RIGHT TORIC LENS;  Surgeon: Leandrew Koyanagi, MD;  Location: [place];  Service: Ophthalmology;  Laterality: Right;  DIABETIC   CHOLECYSTECTOMY N/A 07/05/2017   Procedure: LAPAROSCOPIC CHOLECYSTECTOMY WITH INTRAOPERATIVE CHOLANGIOGRAM;  Surgeon: Robert Bellow, MD;  Location: [facility];  Service: General;  Laterality: N/A;   COLONOSCOPY     2009 and color gaurd in 2018   COLONOSCOPY N/A 03/25/2020   Procedure: COLONOSCOPY;  Surgeon: [name], MD;  Location: [facility];  Service: Endoscopy;  Laterality: N/A;   DRUG INDUCED ENDOSCOPY N/A 05/06/2021   Procedure: DRUG INDUCED SLEEP ENDOSCOPY;  Surgeon: Melida Quitter, MD;  Location: [place];  Service: ENT;  Laterality: N/A;   ERCP N/A 07/12/2017   Procedure: ENDOSCOPIC RETROGRADE CHOLANGIOPANCREATOGRAPHY (ERCP);  Surgeon: Lucilla Lame, MD;  Location: [facility];  Service: Endoscopy;  Laterality: N/A;   ERCP N/A 10/04/2017   Procedure: ENDOSCOPIC RETROGRADE CHOLANGIOPANCREATOGRAPHY (ERCP);  Surgeon: Lucilla Lame, MD;  Location: [facility];  Service: Endoscopy;  Laterality: N/A;   FOOT SURGERY     x 2   INCISIONAL HERNIA REPAIR N/A 12/04/2020   Procedure: VENTRAL INCISIONAL HERNIA REPAIR WITH MESH;  Surgeon: Armandina Gemma, MD;  Location: [facility];  Service: General;  Laterality: N/A;   MELANOMA EXCISION Left 2000   PALATOPLASTY N/A 04/08/2015   Procedure: PALATOPLASTY;  Surgeon: Beverly Gust, MD;  Location: [facility];  Service: ENT;  Laterality: N/A;   TONSILLECTOMY N/A 04/08/2015   Procedure: TONSILLECTOMY;  Surgeon: Beverly Gust, MD;  Location: [facility];  Service: ENT;  Laterality: N/A;  TUBAL LIGATION     Patient Active Problem List   Diagnosis Date Noted   Chronic pain of right ankle 11/23/2021   Overweight (BMI 25.0-29.9) 11/23/2021   Paroxysmal atrial fibrillation ([place]) 10/09/2021   Aortic atherosclerosis ([place]) 05/12/2021   History of COVID-19 05/12/2021   Positive colorectal cancer screening using Cologuard test 02/05/2020   Esophageal dysphagia 12/11/2019   Hyperlipidemia LDL goal <100 12/10/2019   Right shoulder pain 10/17/2019   Arthritis of right acromioclavicular joint 08/15/2019   Piriformis syndrome of left side 08/15/2019   OSA  (obstructive sleep apnea) 05/13/2019   CKD (chronic kidney disease) stage 3, GFR 30-59 ml/min (HCC) 04/13/2018   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   S/P laparoscopic cholecystectomy 07/19/2017   Hospital discharge follow-up 07/19/2017   Acoustic neuroma ([place]) 06/12/2017   Colon cancer screening 03/08/2017   Nocturia 12/09/2016   Major depressive disorder, 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[place]) 06/05/2016   Encounter for preventive health examination 03/07/2015   Incisional hernia, without obstruction or gangrene 09/28/2014   Anxiety state 11/19/2013   Gastro-esophageal reflux disease without esophagitis 11/19/2013   Squamous cell carcinoma of skin of face 10/21/2011   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 Remains with dry cough. Has not had room spinning sensations since last session but feels slightly off balance.   PAIN:  Are you having pain? Ongoing neck pain   PERTINENT HISTORY: Pt is here for vestibular PT evaluation regarding difficulties with balance and "unable to walk straight". Pt reports having been diagnosed with an acoustic neuroma in L ear about 6 yrs ago secondary to having balance issues. Went to physician and was sent to PT under the assumption the inner ear was the cause of the balance issues. Pt states that balance did not get any better and went back to physician and had imaging done. Per pt, scan found the neuroma and plan  was to monitor it. Gets imaging each year as a result and has show no changes as of now. Has been sent to an audiologist during this time and was given hearing aids. Pt recently went to PT for ankle s/p surgery for collapsed arch in January, reports no restrictions regarding ankle for therapy and feels PT helped tremendously. During surgery, pt reports she went into A-fib and is now seeing cardiologist. Was placed on several medications, and was having episodes of dizziness and hallucinations as side effects from one of the medications. Went back to her physician and dosages have been adjusted and reports no side effects since. Pt attributes all of these events to her balance being "off". Reports that sudden head movements can bring on the "off balance" sensation, as well as bending down. Pt reports she will get the feeling "something is off" and will notice she has veered to one side or the other. PMH is significant for: acoustic neuroma, arthritis, hx of other cancer, chronic kidney disease, anxiety, depression and HTN. See chart for additional information.     PRECAUTIONS: Fall  WEIGHT BEARING RESTRICTIONS No  FALLS: Has patient fallen in last 6 months? Yes. Number of falls 1; in January fell on way to bathroom following ankle surgery, got off balance and cut head open on corner of chest, did not go to the ER, talked to PCP about head injury and reports PCP had no concerns.  LIVING ENVIRONMENT: deferred to next session secondary to limited time Lives with: lives with their family Lives in: Other town house Stairs:    Has following equipment at home:     PLOF: [place]: "be able to walk straight"  OBJECTIVE:  Objective data taken at eval unless otherwise specified   DIAGNOSTIC FINDINGS: Taken from 11/27/2021 MR BRAIN/IAC W WO CONTRAST: "IMPRESSION: 1. 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 2. Moderate chronic microvascular ischemic changes of the white  matter in mild parenchymal volume loss, stable to mildly progressed since prior MRI."  COGNITION: Overall cognitive status: Within functional limits for tasks assessed    POSTURE: rounded shoulders and forward head   GAIT: Assistive device utilized: None Level of assistance: Complete Independence Comments: pt reports path deviations, unable to formally assess, plan to assess further next sessions, difficulty maintaining postural stability with scanning/head turns per pt report  PATIENT SURVEYS:  ABC scale 64% DHI 48: indicates moderate handicap FOTO 53 (goal score 59)   VESTIBULAR ASSESSMENT     SYMPTOM BEHAVIOR: Subjective history: complaints of imbalance, difficulty with turning head. Has acoustic neuroma (see subjective history above for details).   Non-Vestibular symptoms:  wears hearing aids B   Type of dizziness: Imbalance (Disequilibrium) and Unsteady with head/body turns   Frequency: with positional changes    Duration: unclear, will clarify future session   Aggravating factors: Induced by motion: occur when walking, bending down to the ground, turning body quickly, and turning head quickly   Relieving factors:  not reported   Progression of symptoms: unchanged   OCULOMOTOR EXAM:   Ocular Alignment: normal   Ocular ROM: No Limitations   Spontaneous Nystagmus: absent   Gaze-Induced Nystagmus: absent   Smooth Pursuits:  occasional saccadic intrusions   Saccades: hypometric/undershoots   Convergence/Divergence: WNL   VESTIBULAR - OCULAR REFLEX:    Slow VOR: Comment: noted corrective saccades with guided cervical rotation/VOR   VOR Cancellation: Comment: not tested   Head-Impulse Test: HIT Left: possible corrective saccade observed, however, pt with increased cervical stiffness B, had difficulty performing test. Attempted modified version/self-performed head impulse test, but not able to achieve sufficient speed.    Dynamic Visual Acuity Test: Static: line 10, normal  with no head movement Dynamic: line 6, impaired    VESTIBULAR TREATMENT: Today's treatment 01/08/2022   Vitals LUE mechanical:   BP: 122/78 mm Hg  HR: 51-52 BPM  Cervical AROM:   Flexion: 78 deg  Extension: 40 deg painful at end range  Rotation R/L: 32/30 deg, painful end range  Lateral flexion R/L: 20 deg bilat painful end range  2-3/10 pain with cervical ranges.   UE MMT R/L:   Shoulder flex:  5/5  Shoulder Abduction: 5/5    Shoulder ER: 4/5  Shoulder IR: 5/5  Elbow flex: 5/5  Elbow extension: 4/4  Wrist flexion: 5/5  Wrist extension: 5/5   Seated cervical AROM:   Extension SNAGS with towel: x15. PT demo prior to completion   Rotation SNAGS with towel: x15/side. Multimodal cues for form/technique throughout.   Alternating upper trap stretch: 3x30 sec/side    Reassessment of cervical AROM:   Extension: 55 degrees  Rotation (R/L): 55/40 degrees  Lateral flexion (R/L): 40/38 degrees          PATIENT EDUCATION: Education details: form/technique with exercise, cervical. Person educated: Patient Education method: Explanation, Demonstration, Tactile cues, Verbal cues, and Handouts Education comprehension: verbalized understanding, returned demonstration, verbal cues required, and needs further education   GOALS: Goals reviewed with patient? No  SHORT TERM GOALS: Target date: 02/19/2022   Pt will be independent in home exercise program to improve strength/mobility for better functional independence with ADLs. Baseline: 7/18: initiate next visit; 8/9: initiated VORx1 seated Goal status: INITIAL   LONG TERM GOALS: Target date: 04/02/2022    Pt will increase FOTO score to at least 59 to demonstrate significant improvement in mobility and quality of life. Baseline: 7/18: 53 Goal status: INITIAL  2.  Pt will reduce dizziness handicap inventory score to < 35, for less dizziness with ADLs and increased safety with home and community tasks. Baseline: 7/18:  48 Goal status: INITIAL  3.  Pt will increase ABC scale score > 80% to demonstrate better functional mobility and better confidence with ADLs. Baseline: 7/18: 64% Goal status: INITIAL  4.  Pt will increase DGI score to at least 23/24 as to reduce fall risk and improve dynamic gait safety with community ambulation.  Baseline: 7/18: deferred to next session; 8/9: revised from FGA to DGI. Pt scored 21/24 on this date Goal status: REVISED  HOME EXERCISE PROGRAM: 01/06/22: Seated VORx1 vertical and horizontal head turns 3 minutes total of each performed in up to 60 second bouts. Instructed to rest if dizziness reaches >2/10.   ASSESSMENT:  CLINICAL IMPRESSION: Continuing per primary PT POC with assessment of cervical mobility and UE MMT as research suggests cervical pain and limited mobility can affect vestibular capabilities. Pt presents with significant limitations with extension, Rotation and bilat side bending. No positional heda positions cause dizziness this date during assessment. With intervention pt greatly improves AROM form pre testing to post testing but remains limited primarily with rotation (L > R). Encouraged to trial exercises over the weekend and if they seem to improve pt symptoms then can add to HEP next session. PT to continue per POC to address deficits to improve QoL.    OBJECTIVE IMPAIRMENTS Abnormal gait, decreased activity tolerance, decreased balance, decreased coordination, decreased endurance, decreased knowledge of use of DME, decreased mobility, difficulty walking, decreased ROM, dizziness, hypomobility, impaired vision/preception, improper body mechanics, and postural dysfunction.   ACTIVITY LIMITATIONS bending, sitting, standing, squatting, stairs, transfers, dressing, locomotion level, and caring for others  PARTICIPATION LIMITATIONS: cleaning, laundry, interpersonal relationship, shopping, community activity, and yard work  PERSONAL FACTORS Age, Past/current  experiences, Sex, Time since onset of injury/illness/exacerbation, and 3+ comorbidities: acoustic neuroma, arthritis, chronic kidney disease, anxiety, depression, hx of other cancer, and HTN  are also affecting patient's functional outcome.   REHAB POTENTIAL: Good  CLINICAL DECISION MAKING: Evolving/moderate complexity  EVALUATION COMPLEXITY: Moderate   PLAN: PT FREQUENCY: 2x/week  PT DURATION: 12 weeks  PLANNED INTERVENTIONS: Therapeutic exercises, Therapeutic activity, Neuromuscular re-education, Balance training, Gait training, Patient/Family education, Self Care, Joint mobilization, Stair training, Vestibular training, Canalith repositioning, Visual/preceptual remediation/compensation, DME instructions, Dry Needling, Electrical stimulation, Spinal mobilization, Cryotherapy, Moist heat, Compression bandaging, Taping, Manual therapy, and Re-evaluation  PLAN FOR NEXT SESSION: reassess cervical spine exercises, gait/balance to vestibular input      Salem Caster. Fairly IV, PT, DPT Physical Therapist- [facility]  01/08/2022, 11:31 AM

## 2022-01-12 ENCOUNTER — Other Ambulatory Visit: Payer: Self-pay | Admitting: Internal Medicine

## 2022-01-13 ENCOUNTER — Ambulatory Visit: Payer: Medicare PPO

## 2022-01-13 DIAGNOSIS — M542 Cervicalgia: Secondary | ICD-10-CM

## 2022-01-13 DIAGNOSIS — R42 Dizziness and giddiness: Secondary | ICD-10-CM

## 2022-01-13 DIAGNOSIS — R2681 Unsteadiness on feet: Secondary | ICD-10-CM

## 2022-01-13 DIAGNOSIS — R2689 Other abnormalities of gait and mobility: Secondary | ICD-10-CM | POA: Diagnosis not present

## 2022-01-13 DIAGNOSIS — M6281 Muscle weakness (generalized): Secondary | ICD-10-CM | POA: Diagnosis not present

## 2022-01-13 DIAGNOSIS — R262 Difficulty in walking, not elsewhere classified: Secondary | ICD-10-CM | POA: Diagnosis not present

## 2022-01-13 NOTE — Therapy (Signed)
OUTPATIENT PHYSICAL THERAPY VESTIBULAR TREATMENT     Patient Name: Kathy Long MRN: [id number] DOB:September 20, 1950, 71 y.o., female Today's Date: 01/13/2022  PCP: [name], MD REFERRING PROVIDER: [name], MD   PT End of Session - 01/13/22 1643     Visit Number 4    Number of Visits 25    Date for PT Re-Evaluation 03/09/22    PT Start Time 0930    PT Stop Time 1013    PT Time Calculation (min) 43 min    Equipment Utilized During Treatment Gait belt    Activity Tolerance Patient tolerated treatment well;No increased pain    Behavior During Therapy WFL for tasks assessed/performed               Past Medical History:  Diagnosis Date   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   Anxiety    Arthritis    lower back, hands   Cancer (HCC)    melanoma, shoulder   Chronic kidney disease    Closed fracture of distal lateral malleolus of left ankle 12/28/2017   Depression    GERD (gastroesophageal reflux disease)    Hyperlipidemia    Hypertension    Postoperative bile leak    Sleep apnea    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    Past Surgical History:  Procedure Laterality Date   ANKLE FRACTURE SURGERY Left    APPENDECTOMY  2010   BACK SURGERY     disectomy lumbar, scar tissue   BREAST CYST EXCISION Right 1970   axilla   buninectomy Bilateral    CATARACT EXTRACTION W/PHACO Left 04/12/2018   Procedure: CATARACT EXTRACTION PHACO AND INTRAOCULAR LENS PLACEMENT ([place])  LEFT TORIC LENS;  Surgeon: Leandrew Koyanagi, MD;  Location: [facility];  Service: Ophthalmology;  Laterality: Left;   CATARACT EXTRACTION W/PHACO Right 05/10/2018   Procedure: CATARACT EXTRACTION PHACO AND INTRAOCULAR LENS PLACEMENT ([place])  RIGHT TORIC LENS;  Surgeon: Leandrew Koyanagi, MD;  Location: [facility];  Service: Ophthalmology;  Laterality: Right;  DIABETIC   CHOLECYSTECTOMY N/A 07/05/2017   Procedure: LAPAROSCOPIC CHOLECYSTECTOMY WITH INTRAOPERATIVE CHOLANGIOGRAM;   Surgeon: Robert Bellow, MD;  Location: [facility];  Service: General;  Laterality: N/A;   COLONOSCOPY     2009 and color gaurd in 2018   COLONOSCOPY N/A 03/25/2020   Procedure: COLONOSCOPY;  Surgeon: [name], MD;  Location: [facility];  Service: Endoscopy;  Laterality: N/A;   DRUG INDUCED ENDOSCOPY N/A 05/06/2021   Procedure: DRUG INDUCED SLEEP ENDOSCOPY;  Surgeon: Melida Quitter, MD;  Location: [facility];  Service: ENT;  Laterality: N/A;   ERCP N/A 07/12/2017   Procedure: ENDOSCOPIC RETROGRADE CHOLANGIOPANCREATOGRAPHY (ERCP);  Surgeon: Lucilla Lame, MD;  Location: [facility];  Service: Endoscopy;  Laterality: N/A;   ERCP N/A 10/04/2017   Procedure: ENDOSCOPIC RETROGRADE CHOLANGIOPANCREATOGRAPHY (ERCP);  Surgeon: Lucilla Lame, MD;  Location: [facility];  Service: Endoscopy;  Laterality: N/A;   FOOT SURGERY     x 2   INCISIONAL HERNIA REPAIR N/A 12/04/2020   Procedure: VENTRAL INCISIONAL HERNIA REPAIR WITH MESH;  Surgeon: Armandina Gemma, MD;  Location: [facility];  Service: General;  Laterality: N/A;   MELANOMA EXCISION Left 2000   PALATOPLASTY N/A 04/08/2015   Procedure: PALATOPLASTY;  Surgeon: Beverly Gust, MD;  Location: [facility];  Service: ENT;  Laterality: N/A;   TONSILLECTOMY N/A 04/08/2015   Procedure: TONSILLECTOMY;  Surgeon: Beverly Gust, MD;  Location: [facility];  Service: ENT;  Laterality: N/A;   TUBAL LIGATION     Patient Active Problem List   Diagnosis Date Noted   Chronic pain of right ankle 11/23/2021   Overweight (BMI 25.0-29.9) 11/23/2021   Paroxysmal atrial fibrillation ([place]) 10/09/2021   Aortic atherosclerosis ([place]) 05/12/2021   History of COVID-19 05/12/2021   Positive colorectal cancer screening using Cologuard test 02/05/2020   Esophageal dysphagia 12/11/2019   Hyperlipidemia LDL goal <100 12/10/2019   Right shoulder pain 10/17/2019   Arthritis of right acromioclavicular joint 08/15/2019   Piriformis syndrome of left side  08/15/2019   OSA (obstructive sleep apnea) 05/13/2019   CKD (chronic kidney disease) stage 3, GFR 30-59 ml/min (HCC) 04/13/2018   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   S/P laparoscopic cholecystectomy 07/19/2017   Hospital discharge follow-up 07/19/2017   Acoustic neuroma ([place]) 06/12/2017   Colon cancer screening 03/08/2017   Nocturia 12/09/2016   Major depressive disorder, 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[place]) 06/05/2016   Encounter for preventive health examination 03/07/2015   Incisional hernia, without obstruction or gangrene 09/28/2014   Anxiety state 11/19/2013   Gastro-esophageal reflux disease without esophagitis 11/19/2013   Squamous cell carcinoma of skin of face 10/21/2011   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 She reports cough "keeps coming and going." Pt wondering if her sinus infection hasn't fully gone away. Continues to report no spinning, but does still feel off with quick turns.   PAIN:  Are you having pain? Ongoing neck pain and headache pain 2/10 (took Tylenol prior to session)   PERTINENT HISTORY: Pt is here for vestibular PT evaluation regarding difficulties with balance and "unable to walk straight". Pt reports having been diagnosed with an acoustic neuroma in L ear about 6 yrs ago secondary to having balance issues. Went to physician and was sent to PT under the assumption the inner ear was the cause of the balance issues. Pt states that balance did not get any better and went back to physician and had  imaging done. Per pt, scan found the neuroma and plan was to monitor it. Gets imaging each year as a result and has show no changes as of now. Has been sent to an audiologist during this time and was given hearing aids. Pt recently went to PT for ankle s/p surgery for collapsed arch in January, reports no restrictions regarding ankle for therapy and feels PT helped tremendously. During surgery, pt reports she went into A-fib and is now seeing cardiologist. Was placed on several medications, and was having episodes of dizziness and hallucinations as side effects from one of the medications. Went back to her physician and dosages have been adjusted and reports no side effects since. Pt attributes all of these events to her balance being "off". Reports that sudden head movements can bring on the "off balance" sensation, as well as bending down. Pt reports she will get the feeling "something is off" and will notice she has veered to one side or the other. PMH is significant for: acoustic neuroma, arthritis, hx of other cancer, chronic kidney disease, anxiety, depression and  HTN. See chart for additional information.     PRECAUTIONS: Fall  WEIGHT BEARING RESTRICTIONS No  FALLS: Has patient fallen in last 6 months? Yes. Number of falls 1; in January fell on way to bathroom following ankle surgery, got off balance and cut head open on corner of chest, did not go to the ER, talked to PCP about head injury and reports PCP had no concerns.  LIVING ENVIRONMENT: deferred to next session secondary to limited time Lives with: lives with their family Lives in: Other town house Stairs:    Has following equipment at home:     PLOF: [place]: "be able to walk straight"  OBJECTIVE:  Objective data taken at eval unless otherwise specified   DIAGNOSTIC FINDINGS: Taken from 11/27/2021 MR BRAIN/IAC W WO CONTRAST: "IMPRESSION: 1. 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 2. Moderate  chronic microvascular ischemic changes of the white matter in mild parenchymal volume loss, stable to mildly progressed since prior MRI."  COGNITION: Overall cognitive status: Within functional limits for tasks assessed    POSTURE: rounded shoulders and forward head   GAIT: Assistive device utilized: None Level of assistance: Complete Independence Comments: pt reports path deviations, unable to formally assess, plan to assess further next sessions, difficulty maintaining postural stability with scanning/head turns per pt report  PATIENT SURVEYS:  ABC scale 64% DHI 48: indicates moderate handicap FOTO 53 (goal score 59)   VESTIBULAR ASSESSMENT     SYMPTOM BEHAVIOR: Subjective history: complaints of imbalance, difficulty with turning head. Has acoustic neuroma (see subjective history above for details).   Non-Vestibular symptoms:  wears hearing aids B   Type of dizziness: Imbalance (Disequilibrium) and Unsteady with head/body turns   Frequency: with positional changes    Duration: unclear, will clarify future session   Aggravating factors: Induced by motion: occur when walking, bending down to the ground, turning body quickly, and turning head quickly   Relieving factors:  not reported   Progression of symptoms: unchanged   OCULOMOTOR EXAM:   Ocular Alignment: normal   Ocular ROM: No Limitations   Spontaneous Nystagmus: absent   Gaze-Induced Nystagmus: absent   Smooth Pursuits:  occasional saccadic intrusions   Saccades: hypometric/undershoots   Convergence/Divergence: WNL   VESTIBULAR - OCULAR REFLEX:    Slow VOR: Comment: noted corrective saccades with guided cervical rotation/VOR   VOR Cancellation: Comment: not tested   Head-Impulse Test: HIT Left: possible corrective saccade observed, however, pt with increased cervical stiffness B, had difficulty performing test. Attempted modified version/self-performed head impulse test, but not able to achieve sufficient speed.     Dynamic Visual Acuity Test: Static: line 10, normal with no head movement Dynamic: line 6, impaired    VESTIBULAR TREATMENT: Today's treatment 01/13/2022  NMR: Gait belt donned and CGA provided unless otherwise noted    Standing VORx1 horiz and vert head turns 2x30 sec of each, plain background  Progressed to performing with busy background 30 sec of each   Body turns/roll against wall for support x multiple reps, performed slowly. Reports unsteadiness and 1/0 dizziness. SBA.  Gait with vertical, horizontal head turns 6x for each over 10 meters, unsteady, most challenged with horizontal, requires up to min assist.  Tandem gait 2x over 10 meters. Challenging, requires up to min assist.  Tandem stance at support surface 2x30 sec each LE. Intermittent UE support  Airex:  NBOS EO 30 sec. Easy NBOS EC 4x30 sec. Challenging. Intermittent UE support   Manual:  on plinth  with heat donned. Pt reports heat feels good. PT assists patient through the following stretches (PROM) held for minutes at a time Upper trap stretch B  Levator scap stretch B Stretch into cervical flexion TrP release provided throughout to cervical paraspinals in 2x30 sec bouts over 4 areas while pt held in stretches. Tenderness noted mostly on L side.   TherEx Chin tucks in to PT hand with manual resistance 5x 3 sec holds  Skin WNL limited upon removal of heat at end of session  PATIENT EDUCATION: Education details: Pt educated throughout session about proper posture and technique with exercises. Improved exercise technique, movement at target joints, use of target muscles after min to mod verbal, visual, tactile cues.  Person educated: Patient Education method: Explanation, Demonstration, Tactile cues, and Verbal cues Education comprehension: verbalized understanding, returned demonstration, verbal cues required, and needs further education   GOALS: Goals reviewed with patient? Yes  SHORT TERM GOALS:  Target date: 02/24/2022   Pt will be independent in home exercise program to improve strength/mobility for better functional independence with ADLs. Baseline: 7/18: initiate next visit; 8/9: initiated VORx1 seated Goal status: INITIAL   LONG TERM GOALS: Target date: 04/07/2022    Pt will increase FOTO score to at least 59 to demonstrate significant improvement in mobility and quality of life. Baseline: 7/18: 53 Goal status: INITIAL  2.  Pt will reduce dizziness handicap inventory score to < 35, for less dizziness with ADLs and increased safety with home and community tasks. Baseline: 7/18: 48 Goal status: INITIAL  3.  Pt will increase ABC scale score > 80% to demonstrate better functional mobility and better confidence with ADLs. Baseline: 7/18: 64% Goal status: INITIAL  4.  Pt will increase DGI score to at least 23/24 as to reduce fall risk and improve dynamic gait safety with community ambulation.  Baseline: 7/18: deferred to next session; 8/9: revised from FGA to DGI. Pt scored 21/24 on this date Goal status: REVISED  HOME EXERCISE PROGRAM: No updates on this date, pt to continue as previously given 01/06/22: Seated VORx1 vertical and horizontal head turns 3 minutes total of each performed in up to 60 second bouts. Instructed to rest if dizziness reaches >2/10.   ASSESSMENT:  CLINICAL IMPRESSION: Pt with minimal dizziness with all interventions. Dizziness most increased with body turns/roll intervention, but reached no greater than 1/10 and quickly resolved. Pt most challenged with dynamic balance tasks, particularly ones involving head turns or narrow BOS. Initiated manual therapy to address possible cervical contribution. The pt will benefit from further skilled PT to improve balance, dizziness, pain and mobility.    OBJECTIVE IMPAIRMENTS Abnormal gait, decreased activity tolerance, decreased balance, decreased coordination, decreased endurance, decreased knowledge of use of DME,  decreased mobility, difficulty walking, decreased ROM, dizziness, hypomobility, impaired vision/preception, improper body mechanics, and postural dysfunction.   ACTIVITY LIMITATIONS bending, sitting, standing, squatting, stairs, transfers, dressing, locomotion level, and caring for others  PARTICIPATION LIMITATIONS: cleaning, laundry, interpersonal relationship, shopping, community activity, and yard work  PERSONAL FACTORS Age, Past/current experiences, Sex, Time since onset of injury/illness/exacerbation, and 3+ comorbidities: acoustic neuroma, arthritis, chronic kidney disease, anxiety, depression, hx of other cancer, and HTN  are also affecting patient's functional outcome.   REHAB POTENTIAL: Good  CLINICAL DECISION MAKING: Evolving/moderate complexity  EVALUATION COMPLEXITY: Moderate   PLAN: PT FREQUENCY: 2x/week  PT DURATION: 12 weeks  PLANNED INTERVENTIONS: Therapeutic exercises, Therapeutic activity, Neuromuscular re-education, Balance training, Gait training, Patient/Family education, Self Care, Joint mobilization, Stair training, Vestibular  training, Canalith repositioning, Visual/preceptual remediation/compensation, DME instructions, Dry Needling, Electrical stimulation, Spinal mobilization, Cryotherapy, Moist heat, Compression bandaging, Taping, Manual therapy, and Re-evaluation  PLAN FOR NEXT SESSION: reassess cervical spine exercises, gait/balance to vestibular input  Ricard Dillon PT, DPT  01/13/2022, 4:50 PM

## 2022-01-14 ENCOUNTER — Ambulatory Visit: Payer: Medicare PPO | Admitting: Internal Medicine

## 2022-01-14 ENCOUNTER — Encounter: Payer: Self-pay | Admitting: Internal Medicine

## 2022-01-14 ENCOUNTER — Telehealth: Payer: Self-pay | Admitting: Internal Medicine

## 2022-01-14 VITALS — BP 128/78 | HR 66 | Temp 97.4°F | Ht 62.0 in | Wt 143.2 lb

## 2022-01-14 DIAGNOSIS — I700 Atherosclerosis of aorta: Secondary | ICD-10-CM

## 2022-01-14 DIAGNOSIS — E663 Overweight: Secondary | ICD-10-CM | POA: Diagnosis not present

## 2022-01-14 DIAGNOSIS — N1831 Chronic kidney disease, stage 3a: Secondary | ICD-10-CM

## 2022-01-14 DIAGNOSIS — D333 Benign neoplasm of cranial nerves: Secondary | ICD-10-CM | POA: Diagnosis not present

## 2022-01-14 DIAGNOSIS — G4733 Obstructive sleep apnea (adult) (pediatric): Secondary | ICD-10-CM | POA: Diagnosis not present

## 2022-01-14 MED ORDER — BENZONATATE 100 MG PO CAPS
200.0000 mg | ORAL_CAPSULE | Freq: Three times a day (TID) | ORAL | 0 refills | Status: DC | PRN
Start: 2022-01-14 — End: 2022-07-21

## 2022-01-14 MED ORDER — TIZANIDINE HCL 4 MG PO TABS: 4.0000 mg | ORAL_TABLET | Freq: Four times a day (QID) | ORAL | 2 refills | Status: DC | PRN

## 2022-01-14 MED ORDER — PREDNISONE 10 MG PO TABS: ORAL_TABLET | ORAL | 0 refills | Status: DC

## 2022-01-14 NOTE — Patient Instructions (Addendum)
Your Blood pressure looks great!  Continue amlodipine  If you have not been contacted by the end of next week about the home sleep study, let me know

## 2022-01-14 NOTE — Telephone Encounter (Signed)
Sleep study Order ins card ofc notes was sent to [facility].

## 2022-01-14 NOTE — Progress Notes (Signed)
Subjective:  Patient ID: Kathy Long, female    DOB: 1951/03/31  Age: 71 y.o. MRN: [id number]  CC: The primary encounter diagnosis was Stage 3a chronic kidney disease ([place]). Diagnoses of Acoustic neuroma ([place]), Aortic atherosclerosis ([place]), OSA (obstructive sleep apnea), and Overweight (BMI 25.0-29.9) were also pertinent to this visit.   HPI Kathy Long presents for FOLLOW UP ON weight management with mounjaro,  OSA currently untreated,  hypertesnion and GAD   Chief Complaint  Patient presents with   Follow-up   1) Obesity:  steadily losing weight on Mounjaro.  Exercise limited by deconditioning.    2) OSA:  untreated. .  Continues to have occasional hallucinationf upon waking  3)  Hypertension: patient checks blood pressure twice weekly at home.  Readings have been for the most part < 140/80 since starting amlodipine  . Patient is following a reduce salt diet most days and is taking medications as prescribed   4) GAD:   Outpatient Medications Prior to Visit  Medication Sig Dispense Refill   ALPRAZolam (XANAX) 0.25 MG tablet Take 1 tablet (0.25 mg total) by mouth 2 (two) times daily as needed for anxiety. 20 tablet 0   amLODipine (NORVASC) 2.5 MG tablet Take 1 tablet (2.5 mg total) by mouth daily. 90 tablet 1   apixaban (ELIQUIS) 5 MG TABS tablet Take 1 tablet (5 mg total) by mouth 2 (two) times daily. 180 tablet 3   Cholecalciferol (VITAMIN D3) 75 MCG (3000 UT) TABS Take 6,000 Units by mouth daily.     citalopram (CELEXA) 20 MG tablet TAKE 1 TABLET BY MOUTH DAILY 90 tablet 1   clobetasol (TEMOVATE) 0.05 % external solution APPLY DAILY UNTIL RASH IS CLEAR 50 mL 0   Coenzyme Q10 100 MG capsule Take 100 mg by mouth daily.     COLLAGEN PO Take 1 tablet by mouth daily.     flecainide (TAMBOCOR) 100 MG tablet Take 100 mg (1 tablet) in the morning and 50 mg (1/2 tablet) in the evening. 126 tablet 3   melatonin 5 MG TABS Take 5 mg by mouth at bedtime.     metoprolol succinate  (TOPROL-XL) 50 MG 24 hr tablet Take 1 tablet (50 mg total) by mouth daily. 90 tablet 3   omeprazole (PRILOSEC) 40 MG capsule TAKE 1 CAPSULE BY MOUTH ONCE DAILY 90 capsule 1   ondansetron (ZOFRAN) 4 MG tablet TAKE 1 TABLET BY MOUTH EVERY 8 HOURS AS NEEDED FOR NAUSEA AND VOMITING 20 tablet 0   rosuvastatin (CRESTOR) 10 MG tablet TAKE ONE TABLET BY MOUTH EVERY DAY 90 tablet 1   tirzepatide (MOUNJARO) 7.5 MG/0.5ML Pen Inject 7.5 mg into the skin once a week. 2 mL 0   UNABLE TO FIND Take 1 tablet by mouth daily. Med Name: Alysia Penna Fruit and Veggie Vitamin     No facility-administered medications prior to visit.    Review of Systems;  Patient denies headache, fevers, malaise, unintentional weight loss, skin rash, eye pain, sinus congestion and sinus pain, sore throat, dysphagia,  hemoptysis , cough, dyspnea, wheezing, chest pain, palpitations, orthopnea, edema, abdominal pain, nausea, melena, diarrhea, constipation, flank pain, dysuria, hematuria, urinary  Frequency, nocturia, numbness, tingling, seizures,  Focal weakness, Loss of consciousness,  Tremor, insomnia, depression, anxiety, and suicidal ideation.      Objective:  BP 128/78 (BP Location: Left Arm, Patient Position: Sitting, Cuff Size: Normal)   Pulse 66   Temp (!) 97.4 F (36.3 C) (Oral)   Ht '5\' 2"'$  (  1.575 m)   Wt 143 lb 3.2 oz (65 kg)   SpO2 98%   BMI 26.19 kg/m   BP Readings from Last 3 Encounters:  01/14/22 128/78  01/01/22 (!) 146/86  11/23/21 136/82    Wt Readings from Last 3 Encounters:  01/14/22 143 lb 3.2 oz (65 kg)  01/01/22 147 lb 3.2 oz (66.8 kg)  11/23/21 147 lb 12.8 oz (67 kg)    General appearance: alert, cooperative and appears stated age Ears: normal TM's and external ear canals both ears Throat: lips, mucosa, and tongue normal; teeth and gums normal Neck: no adenopathy, no carotid bruit, supple, symmetrical, trachea midline and thyroid not enlarged, symmetric, no tenderness/mass/nodules Back:  symmetric, no curvature. ROM normal. No CVA tenderness. Lungs: clear to auscultation bilaterally Heart: regular rate and rhythm, S1, S2 normal, no murmur, click, rub or gallop Abdomen: soft, non-tender; bowel sounds normal; no masses,  no organomegaly Pulses: 2+ and symmetric Skin: Skin color, texture, turgor normal. No rashes or lesions Lymph nodes: Cervical, supraclavicular, and axillary nodes normal.  Lab Results  Component Value Date   HGBA1C 5.4 10/09/2021   HGBA1C 5.0 03/10/2021   HGBA1C 5.8 02/28/2019    Lab Results  Component Value Date   CREATININE 1.22 (H) 11/23/2021   CREATININE 1.06 (H) 10/09/2021   CREATININE 1.30 (H) 04/30/2021    Lab Results  Component Value Date   WBC 5.4 03/10/2021   HGB 13.6 03/10/2021   HCT 43 03/10/2021   PLT 196 03/10/2021   GLUCOSE 76 11/23/2021   CHOL 140 10/09/2021   TRIG 117 10/09/2021   HDL 48 (L) 10/09/2021   LDLCALC 72 10/09/2021   ALT 24 11/23/2021   AST 22 11/23/2021   NA 142 11/23/2021   K 4.0 11/23/2021   CL 104 11/23/2021   CREATININE 1.22 (H) 11/23/2021   BUN 19 11/23/2021   CO2 29 11/23/2021   TSH 1.17 03/10/2021   INR 0.91 10/23/2009   HGBA1C 5.4 10/09/2021   MICROALBUR 1.3 10/09/2021    CT FOOT RIGHT WO CONTRAST  Result Date: 12/06/2021 CLINICAL DATA:  Status post arthrodesis.  Evaluate for bone healing. EXAM: CT OF THE RIGHT FOOT WITHOUT CONTRAST TECHNIQUE: Multidetector CT imaging of the right foot was performed according to the standard protocol. Multiplanar CT image reconstructions were also generated. RADIATION DOSE REDUCTION: This exam was performed according to the departmental dose-optimization program which includes automated exposure control, adjustment of the mA and/or kV according to patient size and/or use of iterative reconstruction technique. COMPARISON:  11/23/2021 FINDINGS: Bones/Joint/Cartilage No acute fracture or dislocation. Posterior subtalar arthrodesis transfixed with 2 cannulated screws and  osseous bridging across the joint space. Talonavicular arthrodesis transfixed with a medial and lateral stable and areas of bone bridging. Small area of persistent joint space without bone bridging along the dorsal aspect. 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 Subchondral cystic changes along the medial corner of the talar dome likely reflecting an osteochondral lesion. Normal alignment. No joint effusion. Severe osteopenia. Healed first metatarsal osteotomy. Small screw in the second metatarsal head. Arthritic changes of the articulation of the first metatarsal head-hallux sesamoid. Small plantar calcaneal spur. Ligaments Ligaments are suboptimally evaluated by CT. Muscles and Tendons Muscles are normal. No muscle atrophy. No intramuscular fluid collection or hematoma. Flexor, extensor, peroneal and Achilles tendons are intact. Soft tissue No fluid collection or hematoma.  No soft tissue mass. IMPRESSION: 1. Posterior subtalar arthrodesis transfixed with 2 cannulated screws and  osseous bridging across the joint space. 2. Talonavicular arthrodesis transfixed with a medial and lateral stable and areas of bone bridging. Small area of persistent joint space without bone bridging along the dorsal aspect. 3. Mild osteoarthritis of the calcaneocuboid joint. 4. 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 5. Subchondral cystic changes along the medial corner of the talar dome likely reflecting an osteochondral lesion. Electronically Signed   By: Kathreen Devoid M.D.   On: 12/06/2021 07:12    Assessment & Plan:   Problem List Items Addressed This Visit     Acoustic neuroma ([place])    Stable by last MRI in 2018.  She has had Progressive hearing loss noted and balance impairment       Relevant Medications   tiZANidine (ZANAFLEX) 4 MG tablet   Aortic atherosclerosis ([facility])    Reviewed findings of prior imaging study .Marland Kitchen  Patient is  tolerating high potency statin therapy and LFTS are  normal  Lab Results  Component Value Date   CHOL 140 10/09/2021   HDL 48 (L) 10/09/2021   LDLCALC 72 10/09/2021   TRIG 117 10/09/2021   CHOLHDL 2.9 10/09/2021         CKD (chronic kidney disease) stage 3, GFR 30-59 ml/min ([facility]) - Primary    GFR is stabel and she is now inder the care of nephrology.  Continue ARB  Lab Results  Component Value Date   CREATININE 1.22 (H) 11/23/2021   Lab Results  Component Value Date   NA 142 11/23/2021   K 4.0 11/23/2021   CL 104 11/23/2021   CO2 29 11/23/2021         OSA (obstructive sleep apnea)    Untreated per patient preference.  Her morning visual hallucinations are likely due to hypoxia.  Sending for repeat study       Overweight (BMI 25.0-29.9)    Her weight  plateaued  On  5 mg dose of Mounjaro and she has increased  dose to 7.5 mg       I spent a total of  32 minutes with this patient in a face to face visit on the date of this encounter reviewing the last office visit with me in July , her  most recent with patient's cardiologist  and nephrologist,   patient'ss diet and eating habits, home blood pressure readings ,  most recent imaging study ,   and post visit ordering of testing and therapeutics.    Follow-up: No follow-ups on file.   [name], MD

## 2022-01-14 NOTE — Progress Notes (Signed)
No new issues or concerns.

## 2022-01-15 ENCOUNTER — Ambulatory Visit: Payer: Medicare PPO

## 2022-01-15 DIAGNOSIS — R2689 Other abnormalities of gait and mobility: Secondary | ICD-10-CM | POA: Diagnosis not present

## 2022-01-15 DIAGNOSIS — R2681 Unsteadiness on feet: Secondary | ICD-10-CM | POA: Diagnosis not present

## 2022-01-15 DIAGNOSIS — M542 Cervicalgia: Secondary | ICD-10-CM | POA: Diagnosis not present

## 2022-01-15 DIAGNOSIS — R42 Dizziness and giddiness: Secondary | ICD-10-CM | POA: Diagnosis not present

## 2022-01-15 DIAGNOSIS — R262 Difficulty in walking, not elsewhere classified: Secondary | ICD-10-CM | POA: Diagnosis not present

## 2022-01-15 DIAGNOSIS — M6281 Muscle weakness (generalized): Secondary | ICD-10-CM | POA: Diagnosis not present

## 2022-01-15 NOTE — Therapy (Signed)
OUTPATIENT PHYSICAL THERAPY VESTIBULAR TREATMENT     Patient Name: Kathy Long MRN: [id number] DOB:Apr 19, 1951, 71 y.o., female Today's Date: 01/15/2022  PCP: [name], MD REFERRING PROVIDER: [name], MD   PT End of Session - 01/15/22 1120     Visit Number 5    Number of Visits 25    Date for PT Re-Evaluation 03/09/22    PT Start Time 0847    PT Stop Time 0929    PT Time Calculation (min) 42 min    Equipment Utilized During Treatment Gait belt    Activity Tolerance Patient tolerated treatment well;No increased pain    Behavior During Therapy WFL for tasks assessed/performed                Past Medical History:  Diagnosis Date   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   Anxiety    Arthritis    lower back, hands   Cancer (HCC)    melanoma, shoulder   Chronic kidney disease    Closed fracture of distal lateral malleolus of left ankle 12/28/2017   Depression    GERD (gastroesophageal reflux disease)    Hyperlipidemia    Hypertension    Postoperative bile leak    Sleep apnea    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    Past Surgical History:  Procedure Laterality Date   ANKLE FRACTURE SURGERY Left    APPENDECTOMY  2010   BACK SURGERY     disectomy lumbar, scar tissue   BREAST CYST EXCISION Right 1970   axilla   buninectomy Bilateral    CATARACT EXTRACTION W/PHACO Left 04/12/2018   Procedure: CATARACT EXTRACTION PHACO AND INTRAOCULAR LENS PLACEMENT ([place])  LEFT TORIC LENS;  Surgeon: Leandrew Koyanagi, MD;  Location: [place];  Service: Ophthalmology;  Laterality: Left;   CATARACT EXTRACTION W/PHACO Right 05/10/2018   Procedure: CATARACT EXTRACTION PHACO AND INTRAOCULAR LENS PLACEMENT ([place])  RIGHT TORIC LENS;  Surgeon: Leandrew Koyanagi, MD;  Location: [place];  Service: Ophthalmology;  Laterality: Right;  DIABETIC   CHOLECYSTECTOMY N/A 07/05/2017   Procedure: LAPAROSCOPIC CHOLECYSTECTOMY WITH INTRAOPERATIVE CHOLANGIOGRAM;   Surgeon: Robert Bellow, MD;  Location: [facility];  Service: General;  Laterality: N/A;   COLONOSCOPY     2009 and color gaurd in 2018   COLONOSCOPY N/A 03/25/2020   Procedure: COLONOSCOPY;  Surgeon: [name], MD;  Location: [facility];  Service: Endoscopy;  Laterality: N/A;   DRUG INDUCED ENDOSCOPY N/A 05/06/2021   Procedure: DRUG INDUCED SLEEP ENDOSCOPY;  Surgeon: Melida Quitter, MD;  Location: [place];  Service: ENT;  Laterality: N/A;   ERCP N/A 07/12/2017   Procedure: ENDOSCOPIC RETROGRADE CHOLANGIOPANCREATOGRAPHY (ERCP);  Surgeon: Lucilla Lame, MD;  Location: [facility] ENDOSCOPY;  Service: Endoscopy;  Laterality: N/A;   ERCP N/A 10/04/2017   Procedure: ENDOSCOPIC RETROGRADE CHOLANGIOPANCREATOGRAPHY (ERCP);  Surgeon: Lucilla Lame, MD;  Location: [facility] ENDOSCOPY;  Service: Endoscopy;  Laterality: N/A;   FOOT SURGERY     x 2   INCISIONAL HERNIA REPAIR N/A 12/04/2020   Procedure: VENTRAL INCISIONAL HERNIA REPAIR WITH MESH;  Surgeon: Armandina Gemma, MD;  Location: [facility];  Service: General;  Laterality: N/A;   MELANOMA EXCISION Left 2000   PALATOPLASTY N/A 04/08/2015   Procedure: PALATOPLASTY;  Surgeon: Beverly Gust, MD;  Location: [facility];  Service: ENT;  Laterality: N/A;   TONSILLECTOMY N/A 04/08/2015   Procedure: TONSILLECTOMY;  Surgeon: Beverly Gust, MD;  Location: [facility];  Service: ENT;  Laterality: N/A;   TUBAL LIGATION     Patient Active Problem List   Diagnosis Date Noted   Chronic pain of right ankle 11/23/2021   Overweight (BMI 25.0-29.9) 11/23/2021   Paroxysmal atrial fibrillation ([place]) 10/09/2021   Aortic atherosclerosis ([place]) 05/12/2021   History of COVID-19 05/12/2021   Positive colorectal cancer screening using Cologuard test 02/05/2020   Esophageal dysphagia 12/11/2019   Hyperlipidemia LDL goal <100 12/10/2019   Right shoulder pain 10/17/2019   Arthritis of right acromioclavicular joint 08/15/2019   Piriformis syndrome of left side  08/15/2019   OSA (obstructive sleep apnea) 05/13/2019   CKD (chronic kidney disease) stage 3, GFR 30-59 ml/min (HCC) 04/13/2018   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   S/P laparoscopic cholecystectomy 07/19/2017   Hospital discharge follow-up 07/19/2017   Acoustic neuroma ([place]) 06/12/2017   Colon cancer screening 03/08/2017   Nocturia 12/09/2016   Major depressive disorder, 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[place]) 06/05/2016   Encounter for preventive health examination 03/07/2015   Incisional hernia, without obstruction or gangrene 09/28/2014   Anxiety state 11/19/2013   Gastro-esophageal reflux disease without esophagitis 11/19/2013   Squamous cell carcinoma of skin of face 10/21/2011   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 Has re-filled cough medicine, going to start muscle relaxer, and steroid. Has not picked up prescriptions just yet. Pt states, "I feel a little bit stopped up. Like I'm sort of down in a well. Like I'm almost talking with an echo."  Pt feels her ears are stuffed-up. When she swallows she feels it in her ears. Neck feels "pretty good" but still has pain felt primarily on left with cervical lateral flexion that reaches 2/10. No stumbles or falls, no other current concerns. No dizziness.  PAIN:  Are you having pain? No pain at rest.   PERTINENT HISTORY: Pt is here for vestibular PT evaluation regarding difficulties with balance and "unable to walk straight". Pt reports having been diagnosed with an acoustic neuroma in L ear about 6 yrs ago secondary to having balance issues.  Went to physician and was sent to PT under the assumption the inner ear was the cause of the balance issues. Pt states that balance did not get any better and went back to physician and had imaging done. Per pt, scan found the neuroma and plan was to monitor it. Gets imaging each year as a result and has show no changes as of now. Has been sent to an audiologist during this time and was given hearing aids. Pt recently went to PT for ankle s/p surgery for collapsed arch in January, reports no restrictions regarding ankle for therapy and feels PT helped tremendously. During surgery, pt reports she went into A-fib and is now seeing cardiologist. Was placed on several medications, and was having episodes of dizziness and hallucinations as side effects from one of the medications. Went back to her physician and dosages have been adjusted and reports no side effects since. Pt attributes all of these events to her balance being "off". Reports that sudden head movements can bring on the "off balance" sensation, as well as bending down. Pt reports she will get  the feeling "something is off" and will notice she has veered to one side or the other. PMH is significant for: acoustic neuroma, arthritis, hx of other cancer, chronic kidney disease, anxiety, depression and HTN. See chart for additional information.     PRECAUTIONS: Fall  WEIGHT BEARING RESTRICTIONS No  FALLS: Has patient fallen in last 6 months? Yes. Number of falls 1; in January fell on way to bathroom following ankle surgery, got off balance and cut head open on corner of chest, did not go to the ER, talked to PCP about head injury and reports PCP had no concerns.  LIVING ENVIRONMENT: deferred to next session secondary to limited time Lives with: lives with their family Lives in: Other town house Stairs:    Has following equipment at home:     PLOF: [place]: "be able to walk straight"  OBJECTIVE:  Objective data taken at  eval unless otherwise specified   DIAGNOSTIC FINDINGS: Taken from 11/27/2021 MR BRAIN/IAC W WO CONTRAST: "IMPRESSION: 1. 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 2. Moderate chronic microvascular ischemic changes of the white matter in mild parenchymal volume loss, stable to mildly progressed since prior MRI."  COGNITION: Overall cognitive status: Within functional limits for tasks assessed    POSTURE: rounded shoulders and forward head   GAIT: Assistive device utilized: None Level of assistance: Complete Independence Comments: pt reports path deviations, unable to formally assess, plan to assess further next sessions, difficulty maintaining postural stability with scanning/head turns per pt report  PATIENT SURVEYS:  ABC scale 64% DHI 48: indicates moderate handicap FOTO 53 (goal score 59)   VESTIBULAR ASSESSMENT     SYMPTOM BEHAVIOR: Subjective history: complaints of imbalance, difficulty with turning head. Has acoustic neuroma (see subjective history above for details).   Non-Vestibular symptoms:  wears hearing aids B   Type of dizziness: Imbalance (Disequilibrium) and Unsteady with head/body turns   Frequency: with positional changes    Duration: unclear, will clarify future session   Aggravating factors: Induced by motion: occur when walking, bending down to the ground, turning body quickly, and turning head quickly   Relieving factors:  not reported   Progression of symptoms: unchanged   OCULOMOTOR EXAM:   Ocular Alignment: normal   Ocular ROM: No Limitations   Spontaneous Nystagmus: absent   Gaze-Induced Nystagmus: absent   Smooth Pursuits:  occasional saccadic intrusions   Saccades: hypometric/undershoots   Convergence/Divergence: WNL   VESTIBULAR - OCULAR REFLEX:    Slow VOR: Comment: noted corrective saccades with guided cervical rotation/VOR   VOR Cancellation: Comment: not tested   Head-Impulse Test: HIT Left: possible corrective saccade  observed, however, pt with increased cervical stiffness B, had difficulty performing test. Attempted modified version/self-performed head impulse test, but not able to achieve sufficient speed.    Dynamic Visual Acuity Test: Static: line 10, normal with no head movement Dynamic: line 6, impaired    VESTIBULAR TREATMENT: Today's treatment 01/15/2022   Manual:  on plinth with heat donned. Pt reports heat feels good. Skin WNL prior to application and following removal of heat. PT assists patient through the following stretches (PROM) held for minutes at a time while additional providing TrP release Upper trap stretch B  Levator scap stretch B Stretch into cervical flexion Suboccip release x several bouts TP found in L UT and B cervical paraspinals, however more ontable R occipital region and in R cervical paraspinls    TherEx Shoulder ER with YTB 2x10 with 3 sec holds  B. Rates medium Shrugs 10x B Chin tucks in to PT hand with manual resistance while in supine 10x 3 sec holds UT stretch 30 sec B  NMR: Gait belt donned and CGA provided unless otherwise noted   Tandem gait 4x over 10 meters. Challenging, requires up to min assist.  Progressed to addition of dual cog task 2x10 meters. Slight improvement noted in balance  Tandem stance at support surface 60 sec sec each LE. Intermittent UE support   Added chin tucks to HEP (see below)  PATIENT EDUCATION: Education details: Pt educated throughout session about proper posture and technique with exercises. Improved exercise technique, movement at target joints, use of target muscles after min to mod verbal, visual, tactile cues. Addition to HEP  Person educated: Patient Education method: Explanation, Demonstration, Tactile cues, and Verbal cues Education comprehension: verbalized understanding, returned demonstration, verbal cues required, and needs further education   GOALS: Goals reviewed with patient? Yes  SHORT TERM GOALS:  Target date: 02/26/2022   Pt will be independent in home exercise program to improve strength/mobility for better functional independence with ADLs. Baseline: 7/18: initiate next visit; 8/9: initiated VORx1 seated Goal status: INITIAL   LONG TERM GOALS: Target date: 04/09/2022    Pt will increase FOTO score to at least 59 to demonstrate significant improvement in mobility and quality of life. Baseline: 7/18: 53 Goal status: INITIAL  2.  Pt will reduce dizziness handicap inventory score to < 35, for less dizziness with ADLs and increased safety with home and community tasks. Baseline: 7/18: 48 Goal status: INITIAL  3.  Pt will increase ABC scale score > 80% to demonstrate better functional mobility and better confidence with ADLs. Baseline: 7/18: 64% Goal status: INITIAL  4.  Pt will increase DGI score to at least 23/24 as to reduce fall risk and improve dynamic gait safety with community ambulation.  Baseline: 7/18: deferred to next session; 8/9: revised from FGA to DGI. Pt scored 21/24 on this date Goal status: REVISED  HOME EXERCISE PROGRAM:   Added 8/18: Access Code: [email or web address] URL: [email or web address] Date: 01/15/2022 Prepared by: Ricard Dillon  Exercises - Supine Chin Tuck  - 1 x daily - 5 x weekly - 2 sets - 10 reps - 3 seconds hold  01/06/22: Seated VORx1 vertical and horizontal head turns 3 minutes total of each performed in up to 60 second bouts. Instructed to rest if dizziness reaches >2/10.   ASSESSMENT:  CLINICAL IMPRESSION: Continued focus on addressing cervical impairments. Pt  demonstrates multiple TrPs in UT, cervical paraspinals, and very TTP in suboccipital musculature. Pt reported quick onset of fatigue with chin tucks so this intervention was added to her HEP. PT had pt practice dynamic balance such as tandem walking with and without dual cog task. Pt improved with reps and with dual cog task. Unclear if FOF is limiting patient's balance or if  patient did experience within session improvement due to practice. Will continue to monitor. The pt will benefit from further skilled PT to improve balance, dizziness, pain and mobility.    OBJECTIVE IMPAIRMENTS Abnormal gait, decreased activity tolerance, decreased balance, decreased coordination, decreased endurance, decreased knowledge of use of DME, decreased mobility, difficulty walking, decreased ROM, dizziness, hypomobility, impaired vision/preception, improper body mechanics, and postural dysfunction.   ACTIVITY LIMITATIONS bending, sitting, standing, squatting, stairs, transfers, dressing, locomotion level, and caring for others  PARTICIPATION LIMITATIONS: cleaning, laundry, interpersonal relationship, shopping, community activity, and yard work  PERSONAL FACTORS Age, Past/current experiences, Sex, Time since onset  of injury/illness/exacerbation, and 3+ comorbidities: acoustic neuroma, arthritis, chronic kidney disease, anxiety, depression, hx of other cancer, and HTN  are also affecting patient's functional outcome.   REHAB POTENTIAL: Good  CLINICAL DECISION MAKING: Evolving/moderate complexity  EVALUATION COMPLEXITY: Moderate   PLAN: PT FREQUENCY: 2x/week  PT DURATION: 12 weeks  PLANNED INTERVENTIONS: Therapeutic exercises, Therapeutic activity, Neuromuscular re-education, Balance training, Gait training, Patient/Family education, Self Care, Joint mobilization, Stair training, Vestibular training, Canalith repositioning, Visual/preceptual remediation/compensation, DME instructions, Dry Needling, Electrical stimulation, Spinal mobilization, Cryotherapy, Moist heat, Compression bandaging, Taping, Manual therapy, and Re-evaluation  PLAN FOR NEXT SESSION: strength, balance, manual therapy, VOR  Ricard Dillon PT, DPT  01/15/2022, 11:28 AM

## 2022-01-16 NOTE — Assessment & Plan Note (Signed)
Stable by last MRI in 2018.  She has had Progressive hearing loss noted and balance impairment

## 2022-01-16 NOTE — Assessment & Plan Note (Signed)
Her weight  plateaued  On  5 mg dose of Mounjaro and she has increased  dose to 7.5 mg

## 2022-01-16 NOTE — Assessment & Plan Note (Signed)
Untreated per patient preference.  Her morning visual hallucinations are likely due to hypoxia.  Sending for repeat study

## 2022-01-16 NOTE — Assessment & Plan Note (Signed)
Reviewed findings of prior imaging study .Marland Kitchen  Patient is tolerating high potency statin therapy and LFTS are  normal  Lab Results  Component Value Date   CHOL 140 10/09/2021   HDL 48 (L) 10/09/2021   LDLCALC 72 10/09/2021   TRIG 117 10/09/2021   CHOLHDL 2.9 10/09/2021

## 2022-01-16 NOTE — Assessment & Plan Note (Signed)
GFR is stabel and she is now inder the care of nephrology.  Continue ARB  Lab Results  Component Value Date   CREATININE 1.22 (H) 11/23/2021   Lab Results  Component Value Date   NA 142 11/23/2021   K 4.0 11/23/2021   CL 104 11/23/2021   CO2 29 11/23/2021

## 2022-01-20 ENCOUNTER — Ambulatory Visit: Payer: Medicare PPO

## 2022-01-21 ENCOUNTER — Telehealth: Payer: Self-pay

## 2022-01-21 NOTE — Telephone Encounter (Signed)
LMTCB. Need to let pt know that she can increase her Amlodipine to 5 mg daily to help with the BP.

## 2022-01-21 NOTE — Telephone Encounter (Signed)
Patient states Dr. Deborra Medina put her on a taper of prednisone because of her sinuses.  Patient states her blood pressure has been high in the mornings and going back down in the afternoons.  Patient states her blood pressure reading early this morning was 156/93 and she just took it a few minutes ago and it was 141/92.  Patient states she took her amLODipine (NORVASC) 2.5 MG tablet about 45 minutes ago.  Patient states she would like to know if she should stop taking the prednisone, or if she should adjust her blood pressure medicine.

## 2022-01-22 ENCOUNTER — Ambulatory Visit: Payer: Medicare PPO

## 2022-01-22 DIAGNOSIS — R42 Dizziness and giddiness: Secondary | ICD-10-CM

## 2022-01-22 DIAGNOSIS — R262 Difficulty in walking, not elsewhere classified: Secondary | ICD-10-CM | POA: Diagnosis not present

## 2022-01-22 DIAGNOSIS — M542 Cervicalgia: Secondary | ICD-10-CM | POA: Diagnosis not present

## 2022-01-22 DIAGNOSIS — M6281 Muscle weakness (generalized): Secondary | ICD-10-CM | POA: Diagnosis not present

## 2022-01-22 DIAGNOSIS — R2681 Unsteadiness on feet: Secondary | ICD-10-CM

## 2022-01-22 DIAGNOSIS — R2689 Other abnormalities of gait and mobility: Secondary | ICD-10-CM | POA: Diagnosis not present

## 2022-01-22 NOTE — Therapy (Signed)
OUTPATIENT PHYSICAL THERAPY VESTIBULAR TREATMENT     Patient Name: Kathy Long MRN: [id number] DOB:1950-06-08, 71 y.o., female Today's Date: 01/22/2022  PCP: [name], MD REFERRING PROVIDER: [name], MD   PT End of Session - 01/22/22 [phone]     Visit Number 6    Number of Visits 25    Date for PT Re-Evaluation 03/09/22    PT Start Time 0846    PT Stop Time 0928    PT Time Calculation (min) 42 min    Equipment Utilized During Treatment Gait belt    Activity Tolerance Patient tolerated treatment well    Behavior During Therapy WFL for tasks assessed/performed                 Past Medical History:  Diagnosis Date   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   Anxiety    Arthritis    lower back, hands   Cancer ([place])    melanoma, shoulder   Chronic kidney disease    Closed fracture of distal lateral malleolus of left ankle 12/28/2017   Depression    GERD (gastroesophageal reflux disease)    Hyperlipidemia    Hypertension    Postoperative bile leak    Sleep apnea    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    Past Surgical History:  Procedure Laterality Date   ANKLE FRACTURE SURGERY Left    APPENDECTOMY  2010   BACK SURGERY     disectomy lumbar, scar tissue   BREAST CYST EXCISION Right 1970   axilla   buninectomy Bilateral    CATARACT EXTRACTION W/PHACO Left 04/12/2018   Procedure: CATARACT EXTRACTION PHACO AND INTRAOCULAR LENS PLACEMENT ([place])  LEFT TORIC LENS;  Surgeon: Leandrew Koyanagi, MD;  Location: [facility];  Service: Ophthalmology;  Laterality: Left;   CATARACT EXTRACTION W/PHACO Right 05/10/2018   Procedure: CATARACT EXTRACTION PHACO AND INTRAOCULAR LENS PLACEMENT ([place])  RIGHT TORIC LENS;  Surgeon: Leandrew Koyanagi, MD;  Location: [facility];  Service: Ophthalmology;  Laterality: Right;  DIABETIC   CHOLECYSTECTOMY N/A 07/05/2017   Procedure: LAPAROSCOPIC CHOLECYSTECTOMY WITH INTRAOPERATIVE CHOLANGIOGRAM;  Surgeon: Robert Bellow, MD;  Location: [facility];  Service: General;  Laterality: N/A;   COLONOSCOPY     2009 and color gaurd in 2018   COLONOSCOPY N/A 03/25/2020   Procedure: COLONOSCOPY;  Surgeon: [name], MD;  Location: [facility]COPY;  Service: Endoscopy;  Laterality: N/A;   DRUG INDUCED ENDOSCOPY N/A 05/06/2021   Procedure: DRUG INDUCED SLEEP ENDOSCOPY;  Surgeon: Melida Quitter, MD;  Location: [facility];  Service: ENT;  Laterality: N/A;   ERCP N/A 07/12/2017   Procedure: ENDOSCOPIC RETROGRADE CHOLANGIOPANCREATOGRAPHY (ERCP);  Surgeon: Lucilla Lame, MD;  Location: [facility]COPY;  Service: Endoscopy;  Laterality: N/A;   ERCP N/A 10/04/2017   Procedure: ENDOSCOPIC RETROGRADE CHOLANGIOPANCREATOGRAPHY (ERCP);  Surgeon: Lucilla Lame, MD;  Location: [facility] ENDOSCOPY;  Service: Endoscopy;  Laterality: N/A;   FOOT SURGERY     x 2   INCISIONAL HERNIA REPAIR N/A 12/04/2020   Procedure: VENTRAL INCISIONAL HERNIA REPAIR WITH MESH;  Surgeon: Armandina Gemma, MD;  Location: [facility];  Service: General;  Laterality: N/A;   MELANOMA EXCISION Left 2000   PALATOPLASTY N/A 04/08/2015   Procedure: PALATOPLASTY;  Surgeon: Beverly Gust, MD;  Location: [facility];  Service: ENT;  Laterality: N/A;   TONSILLECTOMY N/A 04/08/2015   Procedure: TONSILLECTOMY;  Surgeon: Beverly Gust, MD;  Location: [facility];  Service: ENT;  Laterality: N/A;   TUBAL LIGATION     Patient Active Problem List   Diagnosis Date Noted   Chronic pain of right ankle 11/23/2021   Overweight (BMI 25.0-29.9) 11/23/2021   Paroxysmal atrial fibrillation ([place]) 10/09/2021   Aortic atherosclerosis ([place]) 05/12/2021   History of COVID-19 05/12/2021   Positive colorectal cancer screening using Cologuard test 02/05/2020   Esophageal dysphagia 12/11/2019   Hyperlipidemia LDL goal <100 12/10/2019   Right shoulder pain 10/17/2019   Arthritis of right acromioclavicular joint 08/15/2019   Piriformis syndrome of left side 08/15/2019   OSA  (obstructive sleep apnea) 05/13/2019   CKD (chronic kidney disease) stage 3, GFR 30-59 ml/min (HCC) 04/13/2018   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   S/P laparoscopic cholecystectomy 07/19/2017   Hospital discharge follow-up 07/19/2017   Acoustic neuroma ([place]) 06/12/2017   Colon cancer screening 03/08/2017   Nocturia 12/09/2016   Major depressive disorder, 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[place]) 06/05/2016   Encounter for preventive health examination 03/07/2015   Incisional hernia, without obstruction or gangrene 09/28/2014   Anxiety state 11/19/2013   Gastro-esophageal reflux disease without esophagitis 11/19/2013   Squamous cell carcinoma of skin of face 10/21/2011   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 Reports took BP this morning before PT and it was fine.  Pt reports no pain currently. She does report dizziness, including having bout of dizziness this morning. She reports when she first got out of the bed she felt dizzy and when she turned while walking. "It seems to be very subtle, but noticeable." Not bad but worse than last week. Has been doing chin tuck HEP but not VOR HEP.  PAIN:  Are you having pain? No pain at rest.   PERTINENT HISTORY: Pt is here for vestibular PT evaluation regarding difficulties with balance and "unable to walk straight". Pt reports having been diagnosed with an acoustic neuroma in L ear about 6 yrs ago secondary to having balance issues. Went to physician and was sent to PT under the  assumption the inner ear was the cause of the balance issues. Pt states that balance did not get any better and went back to physician and had imaging done. Per pt, scan found the neuroma and plan was to monitor it. Gets imaging each year as a result and has show no changes as of now. Has been sent to an audiologist during this time and was given hearing aids. Pt recently went to PT for ankle s/p surgery for collapsed arch in January, reports no restrictions regarding ankle for therapy and feels PT helped tremendously. During surgery, pt reports she went into A-fib and is now seeing cardiologist. Was placed on several medications, and was having episodes of dizziness and hallucinations as side effects from one of the medications. Went back to her physician and dosages have been adjusted and reports no side effects since. Pt attributes all of these events to her balance being "off". Reports that sudden head movements can bring on the "off balance" sensation, as well as bending down. Pt reports she will  get the feeling "something is off" and will notice she has veered to one side or the other. PMH is significant for: acoustic neuroma, arthritis, hx of other cancer, chronic kidney disease, anxiety, depression and HTN. See chart for additional information.     PRECAUTIONS: Fall  WEIGHT BEARING RESTRICTIONS No  FALLS: Has patient fallen in last 6 months? Yes. Number of falls 1; in January fell on way to bathroom following ankle surgery, got off balance and cut head open on corner of chest, did not go to the ER, talked to PCP about head injury and reports PCP had no concerns.  LIVING ENVIRONMENT: deferred to next session secondary to limited time Lives with: lives with their family Lives in: Other town house Stairs:    Has following equipment at home:     PLOF: [place]: "be able to walk straight"  OBJECTIVE:  Objective data taken at eval unless otherwise specified   DIAGNOSTIC  FINDINGS: Taken from 11/27/2021 MR BRAIN/IAC W WO CONTRAST: "IMPRESSION: 1. 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 2. Moderate chronic microvascular ischemic changes of the white matter in mild parenchymal volume loss, stable to mildly progressed since prior MRI."  COGNITION: Overall cognitive status: Within functional limits for tasks assessed    POSTURE: rounded shoulders and forward head   GAIT: Assistive device utilized: None Level of assistance: Complete Independence Comments: pt reports path deviations, unable to formally assess, plan to assess further next sessions, difficulty maintaining postural stability with scanning/head turns per pt report  PATIENT SURVEYS:  ABC scale 64% DHI 48: indicates moderate handicap FOTO 53 (goal score 59)   VESTIBULAR ASSESSMENT     SYMPTOM BEHAVIOR: Subjective history: complaints of imbalance, difficulty with turning head. Has acoustic neuroma (see subjective history above for details).   Non-Vestibular symptoms:  wears hearing aids B   Type of dizziness: Imbalance (Disequilibrium) and Unsteady with head/body turns   Frequency: with positional changes    Duration: unclear, will clarify future session   Aggravating factors: Induced by motion: occur when walking, bending down to the ground, turning body quickly, and turning head quickly   Relieving factors:  not reported   Progression of symptoms: unchanged   OCULOMOTOR EXAM:   Ocular Alignment: normal   Ocular ROM: No Limitations   Spontaneous Nystagmus: absent   Gaze-Induced Nystagmus: absent   Smooth Pursuits:  occasional saccadic intrusions   Saccades: hypometric/undershoots   Convergence/Divergence: WNL   VESTIBULAR - OCULAR REFLEX:    Slow VOR: Comment: noted corrective saccades with guided cervical rotation/VOR   VOR Cancellation: Comment: not tested   Head-Impulse Test: HIT Left: possible corrective saccade observed, however, pt with increased cervical  stiffness B, had difficulty performing test. Attempted modified version/self-performed head impulse test, but not able to achieve sufficient speed.    Dynamic Visual Acuity Test: Static: line 10, normal with no head movement Dynamic: line 6, impaired    VESTIBULAR TREATMENT: Today's treatment 01/22/2022  Manual:  on plinth with heat donned to B shoulders.  Pt reports heat feels good. Skin WNL prior to application and following removal of heat.  PT assists patient through the following stretches (PROM) held for minutes at a time while additional providing  STM and TrP release to B UT, B cervical parapsinals and muscles of occipital triangle  Upper trap stretch B  Levator scap stretch B Suboccip release x several bouts 15-20 sec TP continue to be found in L UT and B cervical paraspinals, more notable L  occipital region and in R cervical paraspinls  today. L UT more tender than R.  Does report some soreness following intervention. Instructed to track over the next 48 hours and report back to PT to determine if manual therapy is effective for pt.   TherEx Supine chin tucks 10x 3 sec holds Standing chin tucks against wall 5x3 sec holds Seated UT stretch 2x30 sec B Continued instruction in technique with the above interventions  NMR: Gait belt donned and CGA provided throughout unless otherwise specified   Standing VORx1, plain background, with horizontal and vertical head turns 2x30 sec of each Comments: symptoms reach 2/10 with horizontal head turns, 3/10 with vertical head turns. Pt requires brief recovery interval.  Body rolls/turns next to wall x multiple reps. Pt reports dizziness reaches 3/10. Requires brief recovery interval (performs UT stretch during recovery interval, see above).  Tandem gait 4x over 10 meters. Continues to be challenging, requires up to min a to correct for LOB  Gait with horizontal head turns 2x10 meters.Requires up to min a to correct for LOB. Reports  increased dizziness with intervention that improves within 1 minute of seated rest to 0/10.  Provided Symptoms to expect with exercise patient education sheet.   PATIENT EDUCATION: Education details: Pt educated throughout session about proper posture and technique with exercises. Improved exercise technique, movement at target joints, use of target muscles after min to mod verbal, visual, tactile cues. Importance of VOR HEP to see progress.  Person educated: Patient Education method: Explanation, Demonstration, and Verbal cues Education comprehension: verbalized understanding, returned demonstration, verbal cues required, and needs further education   GOALS: Goals reviewed with patient? Yes  SHORT TERM GOALS: Target date: 03/05/2022   Pt will be independent in home exercise program to improve strength/mobility for better functional independence with ADLs. Baseline: 7/18: initiate next visit; 8/9: initiated VORx1 seated Goal status: INITIAL   LONG TERM GOALS: Target date: 04/16/2022    Pt will increase FOTO score to at least 59 to demonstrate significant improvement in mobility and quality of life. Baseline: 7/18: 53 Goal status: INITIAL  2.  Pt will reduce dizziness handicap inventory score to < 35, for less dizziness with ADLs and increased safety with home and community tasks. Baseline: 7/18: 48 Goal status: INITIAL  3.  Pt will increase ABC scale score > 80% to demonstrate better functional mobility and better confidence with ADLs. Baseline: 7/18: 64% Goal status: INITIAL  4.  Pt will increase DGI score to at least 23/24 as to reduce fall risk and improve dynamic gait safety with community ambulation.  Baseline: 7/18: deferred to next session; 8/9: revised from FGA to DGI. Pt scored 21/24 on this date Goal status: REVISED  HOME EXERCISE PROGRAM:   8/25: reinforced importance of performing all of HEP, specifically VOR intervention  Added 8/18: Access Code: [email or web address] URL:  [email or web address] Date: 01/15/2022 Prepared by: Ricard Dillon  Exercises - Supine Chin Tuck  - 1 x daily - 5 x weekly - 2 sets - 10 reps - 3 seconds hold  01/06/22: Seated VORx1 vertical and horizontal head turns 3 minutes total of each performed in up to 60 second bouts. Instructed to rest if dizziness reaches >2/10.   ASSESSMENT:  CLINICAL IMPRESSION: Pt returns to PT after missing last appointment due to BP. Reports took BP prior to session and is fine today, has been better since she reports stopping prednisone. Dizziness more easily provoked with interventions on this date. Dizziness reached up to 3/10, but  resolved within less than a minute with rest with no residual unsteadiness. Pt continues to have difficulty with all dynamic balance tasks, requiring up to min assist. Pt did report she has not been performing VOR as part of her HEP. PT reinforced importance of performing this exercise in order to see progress. Pt verbalized understanding. The pt will benefit from further skilled PT to improve balance, dizziness, pain and mobility.    OBJECTIVE IMPAIRMENTS Abnormal gait, decreased activity tolerance, decreased balance, decreased coordination, decreased endurance, decreased knowledge of use of DME, decreased mobility, difficulty walking, decreased ROM, dizziness, hypomobility, impaired vision/preception, improper body mechanics, and postural dysfunction.   ACTIVITY LIMITATIONS bending, sitting, standing, squatting, stairs, transfers, dressing, locomotion level, and caring for others  PARTICIPATION LIMITATIONS: cleaning, laundry, interpersonal relationship, shopping, community activity, and yard work  PERSONAL FACTORS Age, Past/current experiences, Sex, Time since onset of injury/illness/exacerbation, and 3+ comorbidities: acoustic neuroma, arthritis, chronic kidney disease, anxiety, depression, hx of other cancer, and HTN  are also affecting patient's functional outcome.    REHAB POTENTIAL: Good  CLINICAL DECISION MAKING: Evolving/moderate complexity  EVALUATION COMPLEXITY: Moderate   PLAN: PT FREQUENCY: 2x/week  PT DURATION: 12 weeks  PLANNED INTERVENTIONS: Therapeutic exercises, Therapeutic activity, Neuromuscular re-education, Balance training, Gait training, Patient/Family education, Self Care, Joint mobilization, Stair training, Vestibular training, Canalith repositioning, Visual/preceptual remediation/compensation, DME instructions, Dry Needling, Electrical stimulation, Spinal mobilization, Cryotherapy, Moist heat, Compression bandaging, Taping, Manual therapy, and Re-evaluation  PLAN FOR NEXT SESSION: strength, balance, manual therapy, VOR, continue plan  Ricard Dillon PT, DPT  01/22/2022, 11:42 AM

## 2022-01-26 ENCOUNTER — Telehealth: Payer: Self-pay

## 2022-01-26 ENCOUNTER — Encounter: Payer: Self-pay | Admitting: *Deleted

## 2022-01-26 NOTE — Telephone Encounter (Signed)
Left voicemail to return call & notified patient to check mychart for Dr. Lupita Dawn response.

## 2022-01-26 NOTE — Telephone Encounter (Signed)
Patient states she was on a Prednisone taper which increased her blood pressure.  Patient states she stopped taking the prednisone and states there were two pills left.  Patient states her blood pressure all day yesterday was in the range of 160-175/98-105.  Patient states Dr. Deborra Medina told her to take an extra dose of her amLODipine (NORVASC) 2.5 MG tablet (for a total of 5 MG), and she did this.  Patient states she also took an extra dose of her metoprolol succinate (TOPROL-XL) 50 MG 24 hr tablet (for a total of 100 MG).  Patient states her blood pressure did not go down immediately.  Patient states her blood pressure reading at bedtime last night was 150 something/98 and this morning at 8:30am it was 118/98.  Patient states she needs Dr. Lupita Dawn advice on what to do.  Patient states she would like to know what she should do if her blood pressure goes up again today.

## 2022-01-27 ENCOUNTER — Ambulatory Visit: Payer: Medicare PPO

## 2022-01-27 DIAGNOSIS — R2689 Other abnormalities of gait and mobility: Secondary | ICD-10-CM | POA: Diagnosis not present

## 2022-01-27 DIAGNOSIS — R2681 Unsteadiness on feet: Secondary | ICD-10-CM

## 2022-01-27 DIAGNOSIS — R42 Dizziness and giddiness: Secondary | ICD-10-CM | POA: Diagnosis not present

## 2022-01-27 DIAGNOSIS — R262 Difficulty in walking, not elsewhere classified: Secondary | ICD-10-CM | POA: Diagnosis not present

## 2022-01-27 DIAGNOSIS — M6281 Muscle weakness (generalized): Secondary | ICD-10-CM | POA: Diagnosis not present

## 2022-01-27 DIAGNOSIS — M542 Cervicalgia: Secondary | ICD-10-CM | POA: Diagnosis not present

## 2022-01-27 NOTE — Therapy (Signed)
OUTPATIENT PHYSICAL THERAPY VESTIBULAR TREATMENT     Patient Name: Kathy Long MRN: [id number] DOB:1951-01-17, 71 y.o., female Today's Date: 01/27/2022  PCP: [name], MD REFERRING PROVIDER: [name], MD   PT End of Session - 01/27/22 1715     Visit Number 7    Number of Visits 25    Date for PT Re-Evaluation 03/09/22    PT Start Time 0934    PT Stop Time 1014    PT Time Calculation (min) 40 min    Equipment Utilized During Treatment Gait belt    Activity Tolerance Patient tolerated treatment well    Behavior During Therapy WFL for tasks assessed/performed                  Past Medical History:  Diagnosis Date   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   Anxiety    Arthritis    lower back, hands   Cancer ([place])    melanoma, shoulder   Chronic kidney disease    Closed fracture of distal lateral malleolus of left ankle 12/28/2017   Depression    GERD (gastroesophageal reflux disease)    Hyperlipidemia    Hypertension    Postoperative bile leak    Sleep apnea    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    Past Surgical History:  Procedure Laterality Date   ANKLE FRACTURE SURGERY Left    APPENDECTOMY  2010   BACK SURGERY     disectomy lumbar, scar tissue   BREAST CYST EXCISION Right 1970   axilla   buninectomy Bilateral    CATARACT EXTRACTION W/PHACO Left 04/12/2018   Procedure: CATARACT EXTRACTION PHACO AND INTRAOCULAR LENS PLACEMENT ([place])  LEFT TORIC LENS;  Surgeon: Leandrew Koyanagi, MD;  Location: [facility];  Service: Ophthalmology;  Laterality: Left;   CATARACT EXTRACTION W/PHACO Right 05/10/2018   Procedure: CATARACT EXTRACTION PHACO AND INTRAOCULAR LENS PLACEMENT ([place])  RIGHT TORIC LENS;  Surgeon: Leandrew Koyanagi, MD;  Location: [facility];  Service: Ophthalmology;  Laterality: Right;  DIABETIC   CHOLECYSTECTOMY N/A 07/05/2017   Procedure: LAPAROSCOPIC CHOLECYSTECTOMY WITH INTRAOPERATIVE CHOLANGIOGRAM;  Surgeon: Robert Bellow, MD;  Location: [facility];  Service: General;  Laterality: N/A;   COLONOSCOPY     2009 and color gaurd in 2018   COLONOSCOPY N/A 03/25/2020   Procedure: COLONOSCOPY;  Surgeon: [name], MD;  Location: [facility];  Service: Endoscopy;  Laterality: N/A;   DRUG INDUCED ENDOSCOPY N/A 05/06/2021   Procedure: DRUG INDUCED SLEEP ENDOSCOPY;  Surgeon: Melida Quitter, MD;  Location: [facility];  Service: ENT;  Laterality: N/A;   ERCP N/A 07/12/2017   Procedure: ENDOSCOPIC RETROGRADE CHOLANGIOPANCREATOGRAPHY (ERCP);  Surgeon: Lucilla Lame, MD;  Location[facility];  Service: Endoscopy;  Laterality: N/A;   ERCP N/A 10/04/2017   Procedure: ENDOSCOPIC RETROGRADE CHOLANGIOPANCREATOGRAPHY (ERCP);  Surgeon: Lucilla Lame, MD;  Location: [facility] ENDOSCOPY;  Service: Endoscopy;  Laterality: N/A;   FOOT SURGERY     x 2   INCISIONAL HERNIA REPAIR N/A 12/04/2020   Procedure: VENTRAL INCISIONAL HERNIA REPAIR WITH MESH;  Surgeon: Armandina Gemma, MD;  Location: [facility];  Service: General;  Laterality: N/A;   MELANOMA EXCISION Left 2000   PALATOPLASTY N/A 04/08/2015   Procedure: PALATOPLASTY;  Surgeon: Beverly Gust, MD;  Location: [facility];  Service: ENT;  Laterality: N/A;   TONSILLECTOMY N/A 04/08/2015   Procedure: TONSILLECTOMY;  Surgeon: Beverly Gust, MD;  Location: [facility];  Service: ENT;  Laterality: N/A;   TUBAL LIGATION     Patient Active Problem List   Diagnosis Date Noted   Chronic pain of right ankle 11/23/2021   Overweight (BMI 25.0-29.9) 11/23/2021   Paroxysmal atrial fibrillation ([place]) 10/09/2021   Aortic atherosclerosis ([place]) 05/12/2021   History of COVID-19 05/12/2021   Positive colorectal cancer screening using Cologuard test 02/05/2020   Esophageal dysphagia 12/11/2019   Hyperlipidemia LDL goal <100 12/10/2019   Right shoulder pain 10/17/2019   Arthritis of right acromioclavicular joint 08/15/2019   Piriformis syndrome of left side 08/15/2019   OSA  (obstructive sleep apnea) 05/13/2019   CKD (chronic kidney disease) stage 3, GFR 30-59 ml/min (HCC) 04/13/2018   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   S/P laparoscopic cholecystectomy 07/19/2017   Hospital discharge follow-up 07/19/2017   Acoustic neuroma ([place]) 06/12/2017   Colon cancer screening 03/08/2017   Nocturia 12/09/2016   Major depressive disorder, 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[place]) 06/05/2016   Encounter for preventive health examination 03/07/2015   Incisional hernia, without obstruction or gangrene 09/28/2014   Anxiety state 11/19/2013   Gastro-esophageal reflux disease without esophagitis 11/19/2013   Squamous cell carcinoma of skin of face 10/21/2011   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 Had HA this morning. Dizziness has not reached beyond a 3/10 since last session. Stumbled prior to session.   PAIN:  Are you having pain? No pain at rest.   PERTINENT HISTORY: Pt is here for vestibular PT evaluation regarding difficulties with balance and "unable to walk straight". Pt reports having been diagnosed with an acoustic neuroma in L ear about 6 yrs ago secondary to having balance issues. Went to physician and was sent to PT under the assumption the inner ear was the cause of the balance issues. Pt states that balance did not get any better and went back to physician and had imaging done. Per pt, scan found the neuroma and plan was to monitor it. Gets imaging each year as a result and has show no changes as of now. Has been sent to an audiologist during this  time and was given hearing aids. Pt recently went to PT for ankle s/p surgery for collapsed arch in January, reports no restrictions regarding ankle for therapy and feels PT helped tremendously. During surgery, pt reports she went into A-fib and is now seeing cardiologist. Was placed on several medications, and was having episodes of dizziness and hallucinations as side effects from one of the medications. Went back to her physician and dosages have been adjusted and reports no side effects since. Pt attributes all of these events to her balance being "off". Reports that sudden head movements can bring on the "off balance" sensation, as well as bending down. Pt reports she will get the feeling "something is off" and will notice she has veered to one side or the other. PMH is significant for: acoustic neuroma, arthritis, hx of other cancer, chronic kidney disease, anxiety, depression and HTN. See chart for additional information.     PRECAUTIONS: Fall  WEIGHT BEARING RESTRICTIONS No  FALLS: Has patient fallen in last 6 months? Yes. Number of  falls 1; in January fell on way to bathroom following ankle surgery, got off balance and cut head open on corner of chest, did not go to the ER, talked to PCP about head injury and reports PCP had no concerns.  LIVING ENVIRONMENT: deferred to next session secondary to limited time Lives with: lives with their family Lives in: Other town house Stairs:    Has following equipment at home:     PLOF: [place]: "be able to walk straight"  OBJECTIVE:  Objective data taken at eval unless otherwise specified   DIAGNOSTIC FINDINGS: Taken from 11/27/2021 MR BRAIN/IAC W WO CONTRAST: "IMPRESSION: 1. 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 2. Moderate chronic microvascular ischemic changes of the white matter in mild parenchymal volume loss, stable to mildly progressed since prior MRI."  COGNITION: Overall cognitive status: Within  functional limits for tasks assessed    POSTURE: rounded shoulders and forward head   GAIT: Assistive device utilized: None Level of assistance: Complete Independence Comments: pt reports path deviations, unable to formally assess, plan to assess further next sessions, difficulty maintaining postural stability with scanning/head turns per pt report  PATIENT SURVEYS:  ABC scale 64% DHI 48: indicates moderate handicap FOTO 53 (goal score 59)   VESTIBULAR ASSESSMENT     SYMPTOM BEHAVIOR: Subjective history: complaints of imbalance, difficulty with turning head. Has acoustic neuroma (see subjective history above for details).   Non-Vestibular symptoms:  wears hearing aids B   Type of dizziness: Imbalance (Disequilibrium) and Unsteady with head/body turns   Frequency: with positional changes    Duration: unclear, will clarify future session   Aggravating factors: Induced by motion: occur when walking, bending down to the ground, turning body quickly, and turning head quickly   Relieving factors:  not reported   Progression of symptoms: unchanged   OCULOMOTOR EXAM:   Ocular Alignment: normal   Ocular ROM: No Limitations   Spontaneous Nystagmus: absent   Gaze-Induced Nystagmus: absent   Smooth Pursuits:  occasional saccadic intrusions   Saccades: hypometric/undershoots   Convergence/Divergence: WNL   VESTIBULAR - OCULAR REFLEX:    Slow VOR: Comment: noted corrective saccades with guided cervical rotation/VOR   VOR Cancellation: Comment: not tested   Head-Impulse Test: HIT Left: possible corrective saccade observed, however, pt with increased cervical stiffness B, had difficulty performing test. Attempted modified version/self-performed head impulse test, but not able to achieve sufficient speed.    Dynamic Visual Acuity Test: Static: line 10, normal with no head movement Dynamic: line 6, impaired    VESTIBULAR TREATMENT: Today's treatment 01/27/2022  Manual:  Seated with  heat donned to B shoulders.  Pt reports heat feels good. Skin WNL prior to application and following removal of heat.  PT provides tool assisted STM and TrP release to B UT, supraspinatus, posterior shoulder musculature and cervical paraspinals for several minutes. Cues pt to perform long-duration upper trap B while providing manual. Significant TrP in R UT/supraspinatus region "Very little soreness now"  TherEx: Seated UT trap stretch 2x30 sec each  NMR: Gait belt donned and CGA provided throughout unless otherwise specified   Standing VORx1, plain background, with horizontal and vertical head turns 4x30 sec of each Comments: symptoms reach 3/10 with horizontal head turns, takes recovery interval (brief)  Over 10 meters: Hoirzontal passes with multi-color ball while ambulating 6x Vertical reaches with multicolor ball and gait 4x Dizziness reaches no greater than 3/10 with interventions, resolves quikcly  NBOS on airex EC 4x30 sec, improves with  reps. Rates difficult. Amb with horizontal head turns 2x 10 meters  PATIENT EDUCATION: Education details: Pt educated throughout session about proper posture and technique with exercises. Improved exercise technique, movement at target joints, use of target muscles after min to mod verbal, visual, tactile cues.  Person educated: Patient Education method: Explanation, Demonstration, and Verbal cues Education comprehension: verbalized understanding, returned demonstration, verbal cues required, and needs further education   GOALS: Goals reviewed with patient? Yes  SHORT TERM GOALS: Target date: 03/10/2022   Pt will be independent in home exercise program to improve strength/mobility for better functional independence with ADLs. Baseline: 7/18: initiate next visit; 8/9: initiated VORx1 seated Goal status: INITIAL   LONG TERM GOALS: Target date: 04/21/2022    Pt will increase FOTO score to at least 59 to demonstrate significant  improvement in mobility and quality of life. Baseline: 7/18: 53 Goal status: INITIAL  2.  Pt will reduce dizziness handicap inventory score to < 35, for less dizziness with ADLs and increased safety with home and community tasks. Baseline: 7/18: 48 Goal status: INITIAL  3.  Pt will increase ABC scale score > 80% to demonstrate better functional mobility and better confidence with ADLs. Baseline: 7/18: 64% Goal status: INITIAL  4.  Pt will increase DGI score to at least 23/24 as to reduce fall risk and improve dynamic gait safety with community ambulation.  Baseline: 7/18: deferred to next session; 8/9: revised from FGA to DGI. Pt scored 21/24 on this date Goal status: REVISED  HOME EXERCISE PROGRAM:   8/25: reinforced importance of performing all of HEP, specifically VOR intervention  Added 8/18: Access Code: [email or web address] URL: [email or web address] Date: 01/15/2022 Prepared by: Ricard Dillon  Exercises - Supine Chin Tuck  - 1 x daily - 5 x weekly - 2 sets - 10 reps - 3 seconds hold  01/06/22: Seated VORx1 vertical and horizontal head turns 3 minutes total of each performed in up to 60 second bouts. Instructed to rest if dizziness reaches >2/10.   ASSESSMENT:  CLINICAL IMPRESSION: Pt reports improvement of soreness following manual therapy today. She is still noted to have TrPs throughout R shoulder. Dizziness reached 3/10 with VOR training and primarily with dynamic balance interventions requiring cervical rotation. Dizziness decreases very quickly to baseline with rest. The pt will benefit from further skilled PT to improve balance, dizziness, pain and mobility.    OBJECTIVE IMPAIRMENTS Abnormal gait, decreased activity tolerance, decreased balance, decreased coordination, decreased endurance, decreased knowledge of use of DME, decreased mobility, difficulty walking, decreased ROM, dizziness, hypomobility, impaired vision/preception, improper body mechanics, and postural  dysfunction.   ACTIVITY LIMITATIONS bending, sitting, standing, squatting, stairs, transfers, dressing, locomotion level, and caring for others  PARTICIPATION LIMITATIONS: cleaning, laundry, interpersonal relationship, shopping, community activity, and yard work  PERSONAL FACTORS Age, Past/current experiences, Sex, Time since onset of injury/illness/exacerbation, and 3+ comorbidities: acoustic neuroma, arthritis, chronic kidney disease, anxiety, depression, hx of other cancer, and HTN  are also affecting patient's functional outcome.   REHAB POTENTIAL: Good  CLINICAL DECISION MAKING: Evolving/moderate complexity  EVALUATION COMPLEXITY: Moderate   PLAN: PT FREQUENCY: 2x/week  PT DURATION: 12 weeks  PLANNED INTERVENTIONS: Therapeutic exercises, Therapeutic activity, Neuromuscular re-education, Balance training, Gait training, Patient/Family education, Self Care, Joint mobilization, Stair training, Vestibular training, Canalith repositioning, Visual/preceptual remediation/compensation, DME instructions, Dry Needling, Electrical stimulation, Spinal mobilization, Cryotherapy, Moist heat, Compression bandaging, Taping, Manual therapy, and Re-evaluation  PLAN FOR NEXT SESSION: strength, balance, manual therapy, VOR, continue plan  Ricard Dillon PT, DPT  01/27/2022, 5:22 PM

## 2022-01-29 ENCOUNTER — Ambulatory Visit: Payer: Medicare PPO | Attending: Internal Medicine

## 2022-01-29 DIAGNOSIS — R2681 Unsteadiness on feet: Secondary | ICD-10-CM | POA: Insufficient documentation

## 2022-01-29 DIAGNOSIS — M6281 Muscle weakness (generalized): Secondary | ICD-10-CM | POA: Insufficient documentation

## 2022-01-29 DIAGNOSIS — R42 Dizziness and giddiness: Secondary | ICD-10-CM | POA: Diagnosis not present

## 2022-01-29 DIAGNOSIS — M25571 Pain in right ankle and joints of right foot: Secondary | ICD-10-CM | POA: Diagnosis not present

## 2022-01-29 DIAGNOSIS — R262 Difficulty in walking, not elsewhere classified: Secondary | ICD-10-CM | POA: Diagnosis not present

## 2022-01-29 DIAGNOSIS — M542 Cervicalgia: Secondary | ICD-10-CM | POA: Diagnosis not present

## 2022-01-29 DIAGNOSIS — R278 Other lack of coordination: Secondary | ICD-10-CM | POA: Diagnosis not present

## 2022-01-29 NOTE — Therapy (Signed)
OUTPATIENT PHYSICAL THERAPY VESTIBULAR TREATMENT     Patient Name: Kathy Long MRN: [id number] DOB:[birth year], 71 y.o., female Today's Date: 01/29/2022  PCP: [name], MD REFERRING PROVIDER: [name], MD   PT End of Session - 01/29/22 [phone]     Visit Number 8    Number of Visits 25    Date for PT Re-Evaluation 03/09/22    PT Start Time 0843    PT Stop Time 0925    PT Time Calculation (min) 42 min    Equipment Utilized During Treatment Gait belt    Activity Tolerance Patient tolerated treatment well    Behavior During Therapy WFL for tasks assessed/performed                   Past Medical History:  Diagnosis Date   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   Anxiety    Arthritis    lower back, hands   Cancer ([place])    melanoma, shoulder   Chronic kidney disease    Closed fracture of distal lateral malleolus of left ankle 12/28/2017   Depression    GERD (gastroesophageal reflux disease)    Hyperlipidemia    Hypertension    Postoperative bile leak    Sleep apnea    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    Past Surgical History:  Procedure Laterality Date   ANKLE FRACTURE SURGERY Left    APPENDECTOMY  2010   BACK SURGERY     disectomy lumbar, scar tissue   BREAST CYST EXCISION Right 1970   axilla   buninectomy Bilateral    CATARACT EXTRACTION W/PHACO Left 04/12/2018   Procedure: CATARACT EXTRACTION PHACO AND INTRAOCULAR LENS PLACEMENT ([place])  LEFT TORIC LENS;  Surgeon: Leandrew Koyanagi, MD;  Location: [facility];  Service: Ophthalmology;  Laterality: Left;   CATARACT EXTRACTION W/PHACO Right 05/10/2018   Procedure: CATARACT EXTRACTION PHACO AND INTRAOCULAR LENS PLACEMENT ([place])  RIGHT TORIC LENS;  Surgeon: Leandrew Koyanagi, MD;  Location: [facility];  Service: Ophthalmology;  Laterality: Right;  DIABETIC   CHOLECYSTECTOMY N/A 07/05/2017   Procedure: LAPAROSCOPIC CHOLECYSTECTOMY WITH INTRAOPERATIVE CHOLANGIOGRAM;  Surgeon: Robert Bellow, MD;  Location: [facility];  Service: General;  Laterality: N/A;   COLONOSCOPY     2009 and color gaurd in 2018   COLONOSCOPY N/A 03/25/2020   Procedure: COLONOSCOPY;  Surgeon: [name], MD;  Location: [facility];  Service: Endoscopy;  Laterality: N/A;   DRUG INDUCED ENDOSCOPY N/A 05/06/2021   Procedure: DRUG INDUCED SLEEP ENDOSCOPY;  Surgeon: Melida Quitter, MD;  Location: [facility];  Service: ENT;  Laterality: N/A;   ERCP N/A 07/12/2017   Procedure: ENDOSCOPIC RETROGRADE CHOLANGIOPANCREATOGRAPHY (ERCP);  Surgeon: Lucilla Lame, MD;  Location: [facility];  Service: Endoscopy;  Laterality: N/A;   ERCP N/A 10/04/2017   Procedure: ENDOSCOPIC RETROGRADE CHOLANGIOPANCREATOGRAPHY (ERCP);  Surgeon: Lucilla Lame, MD;  Location: [facility];  Service: Endoscopy;  Laterality: N/A;   FOOT SURGERY     x 2   INCISIONAL HERNIA REPAIR N/A 12/04/2020   Procedure: VENTRAL INCISIONAL HERNIA REPAIR WITH MESH;  Surgeon: Armandina Gemma, MD;  Location: [facility];  Service: General;  Laterality: N/A;   MELANOMA EXCISION Left 2000   PALATOPLASTY N/A 04/08/2015   Procedure: PALATOPLASTY;  Surgeon: Beverly Gust, MD;  Location: [facility];  Service: ENT;  Laterality: N/A;   TONSILLECTOMY N/A 04/08/2015   Procedure: TONSILLECTOMY;  Surgeon: Beverly Gust, MD;  Location: [facility];  Service:  ENT;  Laterality: N/A;   TUBAL LIGATION     Patient Active Problem List   Diagnosis Date Noted   Chronic pain of right ankle 11/23/2021   Overweight (BMI 25.0-29.9) 11/23/2021   Paroxysmal atrial fibrillation ([place]) 10/09/2021   Aortic atherosclerosis ([place]) 05/12/2021   History of COVID-19 05/12/2021   Positive colorectal cancer screening using Cologuard test 02/05/2020   Esophageal dysphagia 12/11/2019   Hyperlipidemia LDL goal <100 12/10/2019   Right shoulder pain 10/17/2019   Arthritis of right acromioclavicular joint 08/15/2019   Piriformis syndrome of left side 08/15/2019   OSA  (obstructive sleep apnea) 05/13/2019   CKD (chronic kidney disease) stage 3, GFR 30-59 ml/min (HCC) 04/13/2018   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   S/P laparoscopic cholecystectomy 07/19/2017   Hospital discharge follow-up 07/19/2017   Acoustic neuroma ([place]) 06/12/2017   Colon cancer screening 03/08/2017   Nocturia 12/09/2016   Major depressive disorder, 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[place]) 06/05/2016   Encounter for preventive health examination 03/07/2015   Incisional hernia, without obstruction or gangrene 09/28/2014   Anxiety state 11/19/2013   Gastro-esophageal reflux disease without esophagitis 11/19/2013   Squamous cell carcinoma of skin of face 10/21/2011   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 She reports she was quite dizzy yesterday, reports it was momentary, and occurred mostly with changing directions. Most provoking movement is still turning. Pt reports she did not have a headache this morning, states, "first time in a long time." Pt reported she had some R ankle pain this morning. Feels a little sore currently.  PAIN:  Are you having pain? Yes, R ankle soreness. Increases some with walking. Rates 1/10.   PERTINENT HISTORY: Pt is here for vestibular PT evaluation regarding difficulties with balance and "unable to walk straight". Pt reports having been diagnosed with an acoustic neuroma in L ear about 6 yrs ago secondary to having balance issues. Went to physician and was sent to PT under the assumption the inner ear was the cause of the  balance issues. Pt states that balance did not get any better and went back to physician and had imaging done. Per pt, scan found the neuroma and plan was to monitor it. Gets imaging each year as a result and has show no changes as of now. Has been sent to an audiologist during this time and was given hearing aids. Pt recently went to PT for ankle s/p surgery for collapsed arch in January, reports no restrictions regarding ankle for therapy and feels PT helped tremendously. During surgery, pt reports she went into A-fib and is now seeing cardiologist. Was placed on several medications, and was having episodes of dizziness and hallucinations as side effects from one of the medications. Went back to her physician and dosages have been adjusted and reports no side effects since. Pt attributes all of these events to her balance being "off". Reports that sudden head movements can bring on the "off balance" sensation, as well as bending down. Pt reports she will get the feeling "something is off" and will notice she has veered to one side or the other. PMH is  significant for: acoustic neuroma, arthritis, hx of other cancer, chronic kidney disease, anxiety, depression and HTN. See chart for additional information.     PRECAUTIONS: Fall  WEIGHT BEARING RESTRICTIONS No  FALLS: Has patient fallen in last 6 months? Yes. Number of falls 1; in January fell on way to bathroom following ankle surgery, got off balance and cut head open on corner of chest, did not go to the ER, talked to PCP about head injury and reports PCP had no concerns.  LIVING ENVIRONMENT: deferred to next session secondary to limited time Lives with: lives with their family Lives in: Other town house Stairs:    Has following equipment at home:     PLOF: [place]: "be able to walk straight"  OBJECTIVE:  Objective data taken at eval unless otherwise specified   DIAGNOSTIC FINDINGS: Taken from 11/27/2021 MR BRAIN/IAC W  WO CONTRAST: "IMPRESSION: 1. 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 2. Moderate chronic microvascular ischemic changes of the white matter in mild parenchymal volume loss, stable to mildly progressed since prior MRI."  COGNITION: Overall cognitive status: Within functional limits for tasks assessed    POSTURE: rounded shoulders and forward head   GAIT: Assistive device utilized: None Level of assistance: Complete Independence Comments: pt reports path deviations, unable to formally assess, plan to assess further next sessions, difficulty maintaining postural stability with scanning/head turns per pt report  PATIENT SURVEYS:  ABC scale 64% DHI 48: indicates moderate handicap FOTO 53 (goal score 59)   VESTIBULAR ASSESSMENT     SYMPTOM BEHAVIOR: Subjective history: complaints of imbalance, difficulty with turning head. Has acoustic neuroma (see subjective history above for details).   Non-Vestibular symptoms:  wears hearing aids B   Type of dizziness: Imbalance (Disequilibrium) and Unsteady with head/body turns   Frequency: with positional changes    Duration: unclear, will clarify future session   Aggravating factors: Induced by motion: occur when walking, bending down to the ground, turning body quickly, and turning head quickly   Relieving factors:  not reported   Progression of symptoms: unchanged   OCULOMOTOR EXAM:   Ocular Alignment: normal   Ocular ROM: No Limitations   Spontaneous Nystagmus: absent   Gaze-Induced Nystagmus: absent   Smooth Pursuits:  occasional saccadic intrusions   Saccades: hypometric/undershoots   Convergence/Divergence: WNL   VESTIBULAR - OCULAR REFLEX:    Slow VOR: Comment: noted corrective saccades with guided cervical rotation/VOR   VOR Cancellation: Comment: not tested   Head-Impulse Test: HIT Left: possible corrective saccade observed, however, pt with increased cervical stiffness B, had difficulty performing test. Attempted  modified version/self-performed head impulse test, but not able to achieve sufficient speed.    Dynamic Visual Acuity Test: Static: line 10, normal with no head movement Dynamic: line 6, impaired    VESTIBULAR TREATMENT: Today's treatment 01/29/2022  Manual:  Seated with heat donned to B shoulders.  Pt reports heat feels good. Skin WNL prior to application and following removal of heat.  PT provides tool assisted STM and TrP release to B UT, supraspinatus, ans posterior shoulder musculature for several minutes. Cues pt to perform long-duration upper trap and cervical rotation stretch B while providing manual. Continues to exhibit TrPs in R UT/supraspinatus region   TherEx: Seated shrugs 10x B YTB shoulder ER/pull-apart 10x B  NMR: Gait belt donned and CGA provided throughout unless otherwise specified   Standing VORx1, busy background, with horizontal and vertical head turns 2x30 sec of each performed WBOS. Then performed  each 1x30 sec in NBOS Comments: symptoms reach 1/10   Hallway: reading sticky notes for head turns  (mix horizontal and vertical) and dual task 2x length of long hallway. Slight increase in BOS variability  360 turns against wall 8x length of wall. Dizziness increases to 1/10. Requires brief recovery intervals.  Over 10 meters: -Tandem gait 4x. Up to min assist. -Horizontal turns holding multicolor ball while ambulating 5x. Dizziness reaches 2/10. Requires seated recovery interval. -Vertical reaches with multicolor ball and gait 4x     PATIENT EDUCATION: Education details: Pt educated throughout session about proper posture and technique with exercises. Improved exercise technique, movement at target joints, use of target muscles after min to mod verbal, visual, tactile cues.  Person educated: Patient Education method: Explanation, Demonstration, and Verbal cues Education comprehension: verbalized understanding, returned demonstration, verbal cues required, and  needs further education   GOALS: Goals reviewed with patient? Yes  SHORT TERM GOALS: Target date: 03/12/2022   Pt will be independent in home exercise program to improve strength/mobility for better functional independence with ADLs. Baseline: 7/18: initiate next visit; 8/9: initiated VORx1 seated Goal status: INITIAL   LONG TERM GOALS: Target date: 04/23/2022    Pt will increase FOTO score to at least 59 to demonstrate significant improvement in mobility and quality of life. Baseline: 7/18: 53 Goal status: INITIAL  2.  Pt will reduce dizziness handicap inventory score to < 35, for less dizziness with ADLs and increased safety with home and community tasks. Baseline: 7/18: 48 Goal status: INITIAL  3.  Pt will increase ABC scale score > 80% to demonstrate better functional mobility and better confidence with ADLs. Baseline: 7/18: 64% Goal status: INITIAL  4.  Pt will increase DGI score to at least 23/24 as to reduce fall risk and improve dynamic gait safety with community ambulation.  Baseline: 7/18: deferred to next session; 8/9: revised from FGA to DGI. Pt scored 21/24 on this date Goal status: REVISED  HOME EXERCISE PROGRAM:   8/25: reinforced importance of performing all of HEP, specifically VOR intervention  Added 8/18: Access Code: [email or web address] URL: [email or web address] Date: 01/15/2022 Prepared by: Ricard Dillon  Exercises - Supine Chin Tuck  - 1 x daily - 5 x weekly - 2 sets - 10 reps - 3 seconds hold  01/06/22: Seated VORx1 vertical and horizontal head turns 3 minutes total of each performed in up to 60 second bouts. Instructed to rest if dizziness reaches >2/10.   ASSESSMENT:  CLINICAL IMPRESSION: Pt with excellent motivation to participate in PT session. Pt with improved ability to perform tandem gait today and was also able to progress to VOR with busy background without dizziness (horiz and vert head turns). She still requires up to min assist for  more dynamic balance activities. Following manual therapy and therex pt also reported her shoulder/neck felt more relaxed. Will continue to progress these interventions as pt able. The pt will benefit from further skilled PT to improve balance, dizziness, pain and mobility.    OBJECTIVE IMPAIRMENTS Abnormal gait, decreased activity tolerance, decreased balance, decreased coordination, decreased endurance, decreased knowledge of use of DME, decreased mobility, difficulty walking, decreased ROM, dizziness, hypomobility, impaired vision/preception, improper body mechanics, and postural dysfunction.   ACTIVITY LIMITATIONS bending, sitting, standing, squatting, stairs, transfers, dressing, locomotion level, and caring for others  PARTICIPATION LIMITATIONS: cleaning, laundry, interpersonal relationship, shopping, community activity, and yard work  PERSONAL FACTORS Age, Past/current experiences, Sex, Time since onset of injury/illness/exacerbation, and 3+ comorbidities: acoustic neuroma, arthritis,  chronic kidney disease, anxiety, depression, hx of other cancer, and HTN  are also affecting patient's functional outcome.   REHAB POTENTIAL: Good  CLINICAL DECISION MAKING: Evolving/moderate complexity  EVALUATION COMPLEXITY: Moderate   PLAN: PT FREQUENCY: 2x/week  PT DURATION: 12 weeks  PLANNED INTERVENTIONS: Therapeutic exercises, Therapeutic activity, Neuromuscular re-education, Balance training, Gait training, Patient/Family education, Self Care, Joint mobilization, Stair training, Vestibular training, Canalith repositioning, Visual/preceptual remediation/compensation, DME instructions, Dry Needling, Electrical stimulation, Spinal mobilization, Cryotherapy, Moist heat, Compression bandaging, Taping, Manual therapy, and Re-evaluation  PLAN FOR NEXT SESSION: strength, balance, manual therapy, VOR, continue plan  Ricard Dillon PT, DPT  01/29/2022, 10:31 AM

## 2022-02-03 ENCOUNTER — Ambulatory Visit: Payer: Medicare PPO

## 2022-02-03 DIAGNOSIS — R42 Dizziness and giddiness: Secondary | ICD-10-CM

## 2022-02-03 DIAGNOSIS — M6281 Muscle weakness (generalized): Secondary | ICD-10-CM

## 2022-02-03 DIAGNOSIS — R278 Other lack of coordination: Secondary | ICD-10-CM | POA: Diagnosis not present

## 2022-02-03 DIAGNOSIS — R262 Difficulty in walking, not elsewhere classified: Secondary | ICD-10-CM | POA: Diagnosis not present

## 2022-02-03 DIAGNOSIS — R2681 Unsteadiness on feet: Secondary | ICD-10-CM | POA: Diagnosis not present

## 2022-02-03 DIAGNOSIS — M542 Cervicalgia: Secondary | ICD-10-CM | POA: Diagnosis not present

## 2022-02-03 DIAGNOSIS — M25571 Pain in right ankle and joints of right foot: Secondary | ICD-10-CM | POA: Diagnosis not present

## 2022-02-03 NOTE — Therapy (Signed)
OUTPATIENT PHYSICAL THERAPY VESTIBULAR TREATMENT     Patient Name: Kathy Long MRN: [id number] DOB:September 09, 1950, 71 y.o., female Today's Date: 02/03/2022  PCP: [name], MD REFERRING PROVIDER: [name], MD   PT End of Session - 02/03/22 1106     Visit Number 9    Number of Visits 25    Date for PT Re-Evaluation 03/09/22    PT Start Time 1106    PT Stop Time 3662    PT Time Calculation (min) 39 min    Equipment Utilized During Treatment Gait belt    Activity Tolerance Patient tolerated treatment well    Behavior During Therapy WFL for tasks assessed/performed                    Past Medical History:  Diagnosis Date   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   Anxiety    Arthritis    lower back, hands   Cancer ([place])    melanoma, shoulder   Chronic kidney disease    Closed fracture of distal lateral malleolus of left ankle 12/28/2017   Depression    GERD (gastroesophageal reflux disease)    Hyperlipidemia    Hypertension    Postoperative bile leak    Sleep apnea    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    Past Surgical History:  Procedure Laterality Date   ANKLE FRACTURE SURGERY Left    APPENDECTOMY  2010   BACK SURGERY     disectomy lumbar, scar tissue   BREAST CYST EXCISION Right 1970   axilla   buninectomy Bilateral    CATARACT EXTRACTION W/PHACO Left 04/12/2018   Procedure: CATARACT EXTRACTION PHACO AND INTRAOCULAR LENS PLACEMENT ([place])  LEFT TORIC LENS;  Surgeon: Leandrew Koyanagi, MD;  Location: [place];  Service: Ophthalmology;  Laterality: Left;   CATARACT EXTRACTION W/PHACO Right 05/10/2018   Procedure: CATARACT EXTRACTION PHACO AND INTRAOCULAR LENS PLACEMENT ([place])  RIGHT TORIC LENS;  Surgeon: Leandrew Koyanagi, MD;  Location: [place];  Service: Ophthalmology;  Laterality: Right;  DIABETIC   CHOLECYSTECTOMY N/A 07/05/2017   Procedure: LAPAROSCOPIC CHOLECYSTECTOMY WITH INTRAOPERATIVE CHOLANGIOGRAM;  Surgeon:  Robert Bellow, MD;  Location: [facility];  Service: General;  Laterality: N/A;   COLONOSCOPY     2009 and color gaurd in 2018   COLONOSCOPY N/A 03/25/2020   Procedure: COLONOSCOPY;  Surgeon: [name], MD;  Location: [facility];  Service: Endoscopy;  Laterality: N/A;   DRUG INDUCED ENDOSCOPY N/A 05/06/2021   Procedure: DRUG INDUCED SLEEP ENDOSCOPY;  Surgeon: Melida Quitter, MD;  Location: [place];  Service: ENT;  Laterality: N/A;   ERCP N/A 07/12/2017   Procedure: ENDOSCOPIC RETROGRADE CHOLANGIOPANCREATOGRAPHY (ERCP);  Surgeon: Lucilla Lame, MD;  Location: [facility]COPY;  Service: Endoscopy;  Laterality: N/A;   ERCP N/A 10/04/2017   Procedure: ENDOSCOPIC RETROGRADE CHOLANGIOPANCREATOGRAPHY (ERCP);  Surgeon: Lucilla Lame, MD;  Location: [facility] END[facility]COPY;  Service: Endoscopy;  Laterality: N/A;   FOOT SURGERY     x 2   INCISIONAL HERNIA REPAIR N/A 12/04/2020   Procedure: VENTRAL INCISIONAL HERNIA REPAIR WITH MESH;  Surgeon: Armandina Gemma, MD;  Location: [facility];  Service: General;  Laterality: N/A;   MELANOMA EXCISION Left 2000   PALATOPLASTY N/A 04/08/2015   Procedure: PALATOPLASTY;  Surgeon: Beverly Gust, MD;  Location: [facility];  Service: ENT;  Laterality: N/A;   TONSILLECTOMY N/A 04/08/2015   Procedure: TONSILLECTOMY;  Surgeon: Beverly Gust, MD;  Location: [facility];  Service: ENT;  Laterality: N/A;   TUBAL LIGATION     Patient Active Problem List   Diagnosis Date Noted   Chronic pain of right ankle 11/23/2021   Overweight (BMI 25.0-29.9) 11/23/2021   Paroxysmal atrial fibrillation ([place]) 10/09/2021   Aortic atherosclerosis ([place]) 05/12/2021   History of COVID-19 05/12/2021   Positive colorectal cancer screening using Cologuard test 02/05/2020   Esophageal dysphagia 12/11/2019   Hyperlipidemia LDL goal <100 12/10/2019   Right shoulder pain 10/17/2019   Arthritis of right acromioclavicular joint 08/15/2019   Piriformis syndrome of left side 08/15/2019    OSA (obstructive sleep apnea) 05/13/2019   CKD (chronic kidney disease) stage 3, GFR 30-59 ml/min ([facility]) 04/13/2018   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   S/P laparoscopic cholecystectomy 07/19/2017   Hospital discharge follow-up 07/19/2017   Acoustic neuroma ([place]) 06/12/2017   Colon cancer screening 03/08/2017   Nocturia 12/09/2016   Major depressive disorder, 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[place]) 06/05/2016   Encounter for preventive health examination 03/07/2015   Incisional hernia, without obstruction or gangrene 09/28/2014   Anxiety state 11/19/2013   Gastro-esophageal reflux disease without esophagitis 11/19/2013   Squamous cell carcinoma of skin of face 10/21/2011   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 Feels she is talking "from a hollow area." She feels her sinuses are not better. PT encourages pt to follow-up with her physician about this. Pt went to her eye doctor yesterday. Pt reports ankle still a little sore. Pt reports her neck is feeling better. Pt reports she does not feel a difference with her balance.   PAIN:  Are you having pain? Yes, R ankle soreness. not rated/10.   PERTINENT HISTORY: Pt is here for vestibular PT evaluation regarding difficulties with balance and "unable to walk straight". Pt reports having been diagnosed with an acoustic neuroma in L ear about 6 yrs ago secondary to having balance issues. Went to physician and was sent to PT under the assumption the  inner ear was the cause of the balance issues. Pt states that balance did not get any better and went back to physician and had imaging done. Per pt, scan found the neuroma and plan was to monitor it. Gets imaging each year as a result and has show no changes as of now. Has been sent to an audiologist during this time and was given hearing aids. Pt recently went to PT for ankle s/p surgery for collapsed arch in January, reports no restrictions regarding ankle for therapy and feels PT helped tremendously. During surgery, pt reports she went into A-fib and is now seeing cardiologist. Was placed on several medications, and was having episodes of dizziness and hallucinations as side effects from one of the medications. Went back to her physician and dosages have been adjusted and reports no side effects since. Pt attributes all of these events to her balance being "off". Reports that sudden head movements can bring on the "off balance" sensation, as well as bending down. Pt reports she will get the feeling "something is off" and will notice  she has veered to one side or the other. PMH is significant for: acoustic neuroma, arthritis, hx of other cancer, chronic kidney disease, anxiety, depression and HTN. See chart for additional information.     PRECAUTIONS: Fall  WEIGHT BEARING RESTRICTIONS No  FALLS: Has patient fallen in last 6 months? Yes. Number of falls 1; in January fell on way to bathroom following ankle surgery, got off balance and cut head open on corner of chest, did not go to the ER, talked to PCP about head injury and reports PCP had no concerns.  LIVING ENVIRONMENT: deferred to next session secondary to limited time Lives with: lives with their family Lives in: Other town house Stairs:    Has following equipment at home:     PLOF: [place]: "be able to walk straight"  OBJECTIVE:  Objective data taken at eval unless otherwise specified   DIAGNOSTIC FINDINGS: Taken  from 11/27/2021 MR BRAIN/IAC W WO CONTRAST: "IMPRESSION: 1. 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 2. Moderate chronic microvascular ischemic changes of the white matter in mild parenchymal volume loss, stable to mildly progressed since prior MRI."  COGNITION: Overall cognitive status: Within functional limits for tasks assessed    POSTURE: rounded shoulders and forward head   GAIT: Assistive device utilized: None Level of assistance: Complete Independence Comments: pt reports path deviations, unable to formally assess, plan to assess further next sessions, difficulty maintaining postural stability with scanning/head turns per pt report  PATIENT SURVEYS:  ABC scale 64% DHI 48: indicates moderate handicap FOTO 53 (goal score 59)   VESTIBULAR ASSESSMENT     SYMPTOM BEHAVIOR: Subjective history: complaints of imbalance, difficulty with turning head. Has acoustic neuroma (see subjective history above for details).   Non-Vestibular symptoms:  wears hearing aids B   Type of dizziness: Imbalance (Disequilibrium) and Unsteady with head/body turns   Frequency: with positional changes    Duration: unclear, will clarify future session   Aggravating factors: Induced by motion: occur when walking, bending down to the ground, turning body quickly, and turning head quickly   Relieving factors:  not reported   Progression of symptoms: unchanged   OCULOMOTOR EXAM:   Ocular Alignment: normal   Ocular ROM: No Limitations   Spontaneous Nystagmus: absent   Gaze-Induced Nystagmus: absent   Smooth Pursuits:  occasional saccadic intrusions   Saccades: hypometric/undershoots   Convergence/Divergence: WNL   VESTIBULAR - OCULAR REFLEX:    Slow VOR: Comment: noted corrective saccades with guided cervical rotation/VOR   VOR Cancellation: Comment: not tested   Head-Impulse Test: HIT Left: possible corrective saccade observed, however, pt with increased cervical stiffness B, had  difficulty performing test. Attempted modified version/self-performed head impulse test, but not able to achieve sufficient speed.    Dynamic Visual Acuity Test: Static: line 10, normal with no head movement Dynamic: line 6, impaired    VESTIBULAR TREATMENT: Today's treatment 02/03/2022  NMR: Vestibular goggles donned to remove visual fixation. PT reassess dix-hallpike as pt reports no perceived improvement in balance, and continued difficulty with head turns/turning. Dix hallpike negative B with no nystagmus observed and no dizziness reported.  Several minutes of the following: Standing on a firm surface, pt performs vertical and horizontal head turns for VORx1 On airex in WBOS and NBOS vertical and horizontal head turns for VORx1 - increased dizziness reported. Brief recovery interval taken. Dizziness resolved quickly. NBOS with EC on airex 60 sec. Difficult for pt.  360 turns against wall 2x6 times length of wall.  Dizziness increased to 2-3/10, but almost immediately resolves once exercise stopped.  Ambulation with horizontal head turns tracking multi-color ball 3x10 meters. Rates medium, continues to exhibit variability in BOS. Pt does report she feels some improvement today with this intervention.  TherEx: Pt seated  UT stretch 40 sec B Seated shrugs 10x B YTB shoulder ER/pull-apart 2x15 B. Fatiguing. YTB shoulder IR 15x B. Rates most difficult. YTB rows 20x B    PATIENT EDUCATION: Education details: Pt educated throughout session about proper posture and technique with exercises. Improved exercise technique, movement at target joints, use of target muscles after min to mod verbal, visual, tactile cues.  Person educated: Patient Education method: Explanation, Demonstration, and Verbal cues Education comprehension: verbalized understanding, returned demonstration, verbal cues required, and needs further education   GOALS: Goals reviewed with patient? Yes  SHORT TERM GOALS:  Target date: 03/17/2022   Pt will be independent in home exercise program to improve strength/mobility for better functional independence with ADLs. Baseline: 7/18: initiate next visit; 8/9: initiated VORx1 seated Goal status: INITIAL   LONG TERM GOALS: Target date: 04/28/2022    Pt will increase FOTO score to at least 59 to demonstrate significant improvement in mobility and quality of life. Baseline: 7/18: 53 Goal status: INITIAL  2.  Pt will reduce dizziness handicap inventory score to < 35, for less dizziness with ADLs and increased safety with home and community tasks. Baseline: 7/18: 48 Goal status: INITIAL  3.  Pt will increase ABC scale score > 80% to demonstrate better functional mobility and better confidence with ADLs. Baseline: 7/18: 64% Goal status: INITIAL  4.  Pt will increase DGI score to at least 23/24 as to reduce fall risk and improve dynamic gait safety with community ambulation.  Baseline: 7/18: deferred to next session; 8/9: revised from FGA to DGI. Pt scored 21/24 on this date Goal status: REVISED  HOME EXERCISE PROGRAM: No updates today, pt to continue HEP as previously given  8/25: reinforced importance of performing all of HEP, specifically VOR intervention  Added 8/18: Access Code: [email or web address] URL: [email or web address] Date: 01/15/2022 Prepared by: Ricard Dillon  Exercises - Supine Chin Tuck  - 1 x daily - 5 x weekly - 2 sets - 10 reps - 3 seconds hold  01/06/22: Seated VORx1 vertical and horizontal head turns 3 minutes total of each performed in up to 60 second bouts. Instructed to rest if dizziness reaches >2/10.   ASSESSMENT:  CLINICAL IMPRESSION: Pt advances to VORx1 on compliant surface today, with minimal decrease in postural stability and dizziness that resolves quickly with rest. This does indicate improved tolerance with intervention, however, pt does not feel her balance has improved. Will assess goals next visit to determine  progress. The pt will benefit from further skilled PT to improve balance, dizziness, pain and mobility.    OBJECTIVE IMPAIRMENTS Abnormal gait, decreased activity tolerance, decreased balance, decreased coordination, decreased endurance, decreased knowledge of use of DME, decreased mobility, difficulty walking, decreased ROM, dizziness, hypomobility, impaired vision/preception, improper body mechanics, and postural dysfunction.   ACTIVITY LIMITATIONS bending, sitting, standing, squatting, stairs, transfers, dressing, locomotion level, and caring for others  PARTICIPATION LIMITATIONS: cleaning, laundry, interpersonal relationship, shopping, community activity, and yard work  PERSONAL FACTORS Age, Past/current experiences, Sex, Time since onset of injury/illness/exacerbation, and 3+ comorbidities: acoustic neuroma, arthritis, chronic kidney disease, anxiety, depression, hx of other cancer, and HTN  are also affecting patient's functional outcome.   REHAB POTENTIAL: Good  CLINICAL DECISION MAKING: Evolving/moderate complexity  EVALUATION  COMPLEXITY: Moderate   PLAN: PT FREQUENCY: 2x/week  PT DURATION: 12 weeks  PLANNED INTERVENTIONS: Therapeutic exercises, Therapeutic activity, Neuromuscular re-education, Balance training, Gait training, Patient/Family education, Self Care, Joint mobilization, Stair training, Vestibular training, Canalith repositioning, Visual/preceptual remediation/compensation, DME instructions, Dry Needling, Electrical stimulation, Spinal mobilization, Cryotherapy, Moist heat, Compression bandaging, Taping, Manual therapy, and Re-evaluation  PLAN FOR NEXT SESSION: strength, balance, manual therapy, VOR, continue plan  Ricard Dillon PT, DPT  02/03/2022, 5:06 PM

## 2022-02-04 NOTE — Therapy (Signed)
OUTPATIENT PHYSICAL THERAPY VESTIBULAR TREATMENT/Physical Therapy Progress Note   Dates of reporting period  12/15/2021   to   02/05/2022      Patient Name: Kathy Long MRN: [id number] DOB:Oct 07, 1950, 71 y.o., female Today's Date: 02/05/2022  PCP: [name], MD REFERRING PROVIDER: [name], MD   PT End of Session - 02/05/22 0931     Visit Number 10    Number of Visits 25    Date for PT Re-Evaluation 03/09/22    PT Start Time 0932    PT Stop Time 5885    PT Time Calculation (min) 42 min    Equipment Utilized During Treatment Gait belt    Activity Tolerance Patient tolerated treatment well    Behavior During Therapy WFL for tasks assessed/performed                     Past Medical History:  Diagnosis Date   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   Anxiety    Arthritis    lower back, hands   Cancer ([place])    melanoma, shoulder   Chronic kidney disease    Closed fracture of distal lateral malleolus of left ankle 12/28/2017   Depression    GERD (gastroesophageal reflux disease)    Hyperlipidemia    Hypertension    Postoperative bile leak    Sleep apnea    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    Past Surgical History:  Procedure Laterality Date   ANKLE FRACTURE SURGERY Left    APPENDECTOMY  2010   BACK SURGERY     disectomy lumbar, scar tissue   BREAST CYST EXCISION Right 1970   axilla   buninectomy Bilateral    CATARACT EXTRACTION W/PHACO Left 04/12/2018   Procedure: CATARACT EXTRACTION PHACO AND INTRAOCULAR LENS PLACEMENT ([place])  LEFT TORIC LENS;  Surgeon: Leandrew Koyanagi, MD;  Location: [place];  Service: Ophthalmology;  Laterality: Left;   CATARACT EXTRACTION W/PHACO Right 05/10/2018   Procedure: CATARACT EXTRACTION PHACO AND INTRAOCULAR LENS PLACEMENT ([place])  RIGHT TORIC LENS;  Surgeon: Leandrew Koyanagi, MD;  Location: [place];  Service: Ophthalmology;  Laterality: Right;  DIABETIC   CHOLECYSTECTOMY N/A 07/05/2017    Procedure: LAPAROSCOPIC CHOLECYSTECTOMY WITH INTRAOPERATIVE CHOLANGIOGRAM;  Surgeon: Robert Bellow, MD;  Location: [facility];  Service: General;  Laterality: N/A;   COLONOSCOPY     2009 and color gaurd in 2018   COLONOSCOPY N/A 03/25/2020   Procedure: COLONOSCOPY;  Surgeon: [name], MD;  Location: [facility];  Service: Endoscopy;  Laterality: N/A;   DRUG INDUCED ENDOSCOPY N/A 05/06/2021   Procedure: DRUG INDUCED SLEEP ENDOSCOPY;  Surgeon: Melida Quitter, MD;  Location: [place];  Service: ENT;  Laterality: N/A;   ERCP N/A 07/12/2017   Procedure: ENDOSCOPIC RETROGRADE CHOLANGIOPANCREATOGRAPHY (ERCP);  Surgeon: Lucilla Lame, MD;  Location: [facility];  Service: Endoscopy;  Laterality: N/A;   ERCP N/A 10/04/2017   Procedure: ENDOSCOPIC RETROGRADE CHOLANGIOPANCREATOGRAPHY (ERCP);  Surgeon: Lucilla Lame, MD;  Location: [facility];  Service: Endoscopy;  Laterality: N/A;   FOOT SURGERY     x 2   INCISIONAL HERNIA REPAIR N/A 12/04/2020   Procedure: VENTRAL INCISIONAL HERNIA REPAIR WITH MESH;  Surgeon: Armandina Gemma, MD;  Location: [facility];  Service: General;  Laterality: N/A;   MELANOMA EXCISION Left 2000   PALATOPLASTY N/A 04/08/2015   Procedure: PALATOPLASTY;  Surgeon: Beverly Gust, MD;  Location: [facility];  Service: ENT;  Laterality: N/A;  TONSILLECTOMY N/A 04/08/2015   Procedure: TONSILLECTOMY;  Surgeon: Beverly Gust, MD;  Location: [facility];  Service: ENT;  Laterality: N/A;   TUBAL LIGATION     Patient Active Problem List   Diagnosis Date Noted   Chronic pain of right ankle 11/23/2021   Overweight (BMI 25.0-29.9) 11/23/2021   Paroxysmal atrial fibrillation ([place]) 10/09/2021   Aortic atherosclerosis ([place]) 05/12/2021   History of COVID-19 05/12/2021   Positive colorectal cancer screening using Cologuard test 02/05/2020   Esophageal dysphagia 12/11/2019   Hyperlipidemia LDL goal <100 12/10/2019   Right shoulder pain 10/17/2019   Arthritis of  right acromioclavicular joint 08/15/2019   Piriformis syndrome of left side 08/15/2019   OSA (obstructive sleep apnea) 05/13/2019   CKD (chronic kidney disease) stage 3, GFR 30-59 ml/min ([place]) 04/13/2018   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   S/P laparoscopic cholecystectomy 07/19/2017   Hospital discharge follow-up 07/19/2017   Acoustic neuroma ([place]) 06/12/2017   Colon cancer screening 03/08/2017   Nocturia 12/09/2016   Major depressive disorder, 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[place]) 06/05/2016   Encounter for preventive health examination 03/07/2015   Incisional hernia, without obstruction or gangrene 09/28/2014   Anxiety state 11/19/2013   Gastro-esophageal reflux disease without esophagitis 11/19/2013   Squamous cell carcinoma of skin of face 10/21/2011   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/2011   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   Melanoma ([place]) 11/02/2010   Essential hypertension 04/10/2010   Osteoporosis 04/10/2010    ONSET DATE: Chronic, 6 years ago  REFERRING DIAG: Balance problems  THERAPY DIAG:  Unsteadiness on feet  Dizziness and giddiness  Muscle weakness (generalized)  Pain in right ankle and joints of right foot  Rationale for Evaluation and Treatment Rehabilitation  SUBJECTIVE:   SUBJECTIVE STATEMENT: Pt report continued popping in ears but not feeling the "down in barrel sensation." She states, "I keep trying to get my ears to pop but they wont do it." Still feels something is wrong with her sinuses. Has not gotten in touch with her physician yet. Pt reports has not lost her footing with turns past few days, "not even a slight bit."  Ankle pain is 2/10, doesn't think pain is getting worse but it is a constant discomfort.  PAIN:  Are you having pain? Yes, R ankle soreness. 2/10.   PERTINENT HISTORY: Pt is here for vestibular PT evaluation regarding difficulties with balance and "unable to walk straight". Pt reports having been diagnosed with an  acoustic neuroma in L ear about 6 yrs ago secondary to having balance issues. Went to physician and was sent to PT under the assumption the inner ear was the cause of the balance issues. Pt states that balance did not get any better and went back to physician and had imaging done. Per pt, scan found the neuroma and plan was to monitor it. Gets imaging each year as a result and has show no changes as of now. Has been sent to an audiologist during this time and was given hearing aids. Pt recently went to PT for ankle s/p surgery for collapsed arch in January, reports no restrictions regarding ankle for therapy and feels PT helped tremendously. During surgery, pt reports she went into A-fib and is now seeing cardiologist. Was placed on several medications, and was having episodes of dizziness and hallucinations as side effects from one of the medications. Went back to her physician and dosages have been adjusted and reports no side effects since. Pt attributes all of these events to her  balance being "off". Reports that sudden head movements can bring on the "off balance" sensation, as well as bending down. Pt reports she will get the feeling "something is off" and will notice she has veered to one side or the other. PMH is significant for: acoustic neuroma, arthritis, hx of other cancer, chronic kidney disease, anxiety, depression and HTN. See chart for additional information.     PRECAUTIONS: Fall  WEIGHT BEARING RESTRICTIONS No  FALLS: Has patient fallen in last 6 months? Yes. Number of falls 1; in January fell on way to bathroom following ankle surgery, got off balance and cut head open on corner of chest, did not go to the ER, talked to PCP about head injury and reports PCP had no concerns.  LIVING ENVIRONMENT: deferred to next session secondary to limited time Lives with: lives with their family Lives in: Other town house Stairs:    Has following equipment at home:     PLOF:  Vivian: "be able to walk straight"  OBJECTIVE:  Objective data taken at eval unless otherwise specified   DIAGNOSTIC FINDINGS: Taken from 11/27/2021 MR BRAIN/IAC W WO CONTRAST: "IMPRESSION: 1. 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 2. Moderate chronic microvascular ischemic changes of the white matter in mild parenchymal volume loss, stable to mildly progressed since prior MRI."  COGNITION: Overall cognitive status: Within functional limits for tasks assessed    POSTURE: rounded shoulders and forward head   GAIT: Assistive device utilized: None Level of assistance: Complete Independence Comments: pt reports path deviations, unable to formally assess, plan to assess further next sessions, difficulty maintaining postural stability with scanning/head turns per pt report  PATIENT SURVEYS:  ABC scale 64% DHI 48: indicates moderate handicap FOTO 53 (goal score 59)   VESTIBULAR ASSESSMENT     SYMPTOM BEHAVIOR: Subjective history: complaints of imbalance, difficulty with turning head. Has acoustic neuroma (see subjective history above for details).   Non-Vestibular symptoms:  wears hearing aids B   Type of dizziness: Imbalance (Disequilibrium) and Unsteady with head/body turns   Frequency: with positional changes    Duration: unclear, will clarify future session   Aggravating factors: Induced by motion: occur when walking, bending down to the ground, turning body quickly, and turning head quickly   Relieving factors:  not reported   Progression of symptoms: unchanged   OCULOMOTOR EXAM:   Ocular Alignment: normal   Ocular ROM: No Limitations   Spontaneous Nystagmus: absent   Gaze-Induced Nystagmus: absent   Smooth Pursuits:  occasional saccadic intrusions   Saccades: hypometric/undershoots   Convergence/Divergence: WNL   VESTIBULAR - OCULAR REFLEX:    Slow VOR: Comment: noted corrective saccades with guided cervical rotation/VOR   VOR  Cancellation: Comment: not tested   Head-Impulse Test: HIT Left: possible corrective saccade observed, however, pt with increased cervical stiffness B, had difficulty performing test. Attempted modified version/self-performed head impulse test, but not able to achieve sufficient speed.    Dynamic Visual Acuity Test: Static: line 10, normal with no head movement Dynamic: line 6, impaired    VESTIBULAR TREATMENT: Today's treatment 02/05/2022  Goal retesting completed on this date. PT instructed pt in goal retesting technique and indications of test performance on plan, progress. Please refer to goal section below for details.  Pt reports difficulty going down steps and that this is greatest fear regarding balance.  Other interventions: Standing in corner, feet apart, EO vertical and horizontal head turns 15x of each Seated YTB shoulder ER B 1x10, RTB  2x10. Rates hard. Ascending/descending stairs with BUE>no UE support, trying reciprocal pattern ascending and step-to descending x multiple reps, close CGA. Pt with decreased step-length descending steps, resulting in heel catching on step. 6" box step-downs, cuing for increased step-length and forefoot strike with descending x multiple reps SLB 4x30 sec each LE with B finger touch support on balance bar  Reviewed HEP additions (see below)   PATIENT EDUCATION: Education details: Goals, progress, plan, HEP Person educated: Patient Education method: Explanation, Demonstration, and Verbal cues Education comprehension: verbalized understanding, returned demonstration, verbal cues required, and needs further education   GOALS: Goals reviewed with patient? Yes  SHORT TERM GOALS: Target date: 03/19/2022   Pt will be independent in home exercise program to improve strength/mobility for better functional independence with ADLs. Baseline: 7/18: initiate next visit; 8/9: initiated VORx1 seated; 9/8: HEP further advanced Goal status: IN  PROGRESS   LONG TERM GOALS: Target date: 04/30/2022    Pt will increase FOTO score to at least 59 to demonstrate significant improvement in mobility and quality of life. Baseline: 7/18: 53, 9/8: 59 Goal status: MET  2.  Pt will reduce dizziness handicap inventory score to < 35, for less dizziness with ADLs and increased safety with home and community tasks. Baseline: 7/18: 48; 9/8: 24 Goal status: MET  3.  Pt will increase ABC scale score > 80% to demonstrate better functional mobility and better confidence with ADLs. Baseline: 7/18: 64%; 9/8: 71.88% Goal status: IN PROGRESS  4.  Pt will increase DGI score to at least 23/24 as to reduce fall risk and improve dynamic gait safety with community ambulation.  Baseline: 7/18: deferred to next session; 8/9: revised from FGA to DGI. Pt scored 21/24 on this date; 9/8: 22/24 Goal status:Partially MET  HOME EXERCISE PROGRAM:   Updated 9/8: Access Code: [email or web address] URL: [email or web address] Date: 02/05/2022 Prepared by: Ricard Dillon  Exercises - Corner Balance Feet Apart: Eyes Open With Head Turns  - 1 x daily - 7 x weekly - 2 sets - 15 reps - Shoulder External Rotation and Scapular Retraction with Resistance  - 1 x daily - 5 x weekly - 3 sets - 10 reps  Previous: 8/25: reinforced importance of performing all of HEP, specifically VOR intervention  Added 8/18: Access Code: [email or web address] URL: [email or web address] Date: 01/15/2022 Prepared by: Ricard Dillon  Exercises - Supine Chin Tuck  - 1 x daily - 5 x weekly - 2 sets - 10 reps - 3 seconds hold  01/06/22: Seated VORx1 vertical and horizontal head turns 3 minutes total of each performed in up to 60 second bouts. Instructed to rest if dizziness reaches >2/10.   ASSESSMENT:  CLINICAL IMPRESSION: Goals reassessed for progress note. Pt making gains AEB achieving DHI and FOTO goals, indicating improved perception of QOL and functional mobility, and decreased handicap  due to dizziness. Pt also improved ABC scale score, indicating increased balance confidence. While pt shows progress, she has not yet met all her goals. She reported today that stairs are her greatest fear regarding her balance. Will continue to focus on this area future sessions. The pt will benefit from further skilled PT to improve balance, dizziness, pain and mobility.    OBJECTIVE IMPAIRMENTS Abnormal gait, decreased activity tolerance, decreased balance, decreased coordination, decreased endurance, decreased knowledge of use of DME, decreased mobility, difficulty walking, decreased ROM, dizziness, hypomobility, impaired vision/preception, improper body mechanics, and postural dysfunction.   ACTIVITY LIMITATIONS bending, sitting, standing, squatting, stairs, transfers, dressing, locomotion level, and caring for  others  PARTICIPATION LIMITATIONS: cleaning, laundry, interpersonal relationship, shopping, community activity, and yard work  PERSONAL FACTORS Age, Past/current experiences, Sex, Time since onset of injury/illness/exacerbation, and 3+ comorbidities: acoustic neuroma, arthritis, chronic kidney disease, anxiety, depression, hx of other cancer, and HTN  are also affecting patient's functional outcome.   REHAB POTENTIAL: Good  CLINICAL DECISION MAKING: Evolving/moderate complexity  EVALUATION COMPLEXITY: Moderate   PLAN: PT FREQUENCY: 2x/week  PT DURATION: 12 weeks  PLANNED INTERVENTIONS: Therapeutic exercises, Therapeutic activity, Neuromuscular re-education, Balance training, Gait training, Patient/Family education, Self Care, Joint mobilization, Stair training, Vestibular training, Canalith repositioning, Visual/preceptual remediation/compensation, DME instructions, Dry Needling, Electrical stimulation, Spinal mobilization, Cryotherapy, Moist heat, Compression bandaging, Taping, Manual therapy, and Re-evaluation  PLAN FOR NEXT SESSION: strength, balance, manual therapy, VOR,  stairs, continue plan  Ricard Dillon PT, DPT  02/05/2022, 11:23 AM

## 2022-02-05 ENCOUNTER — Ambulatory Visit: Payer: Medicare PPO

## 2022-02-05 DIAGNOSIS — M25571 Pain in right ankle and joints of right foot: Secondary | ICD-10-CM

## 2022-02-05 DIAGNOSIS — M6281 Muscle weakness (generalized): Secondary | ICD-10-CM | POA: Diagnosis not present

## 2022-02-05 DIAGNOSIS — R262 Difficulty in walking, not elsewhere classified: Secondary | ICD-10-CM | POA: Diagnosis not present

## 2022-02-05 DIAGNOSIS — R2681 Unsteadiness on feet: Secondary | ICD-10-CM

## 2022-02-05 DIAGNOSIS — R42 Dizziness and giddiness: Secondary | ICD-10-CM

## 2022-02-05 DIAGNOSIS — M542 Cervicalgia: Secondary | ICD-10-CM | POA: Diagnosis not present

## 2022-02-05 DIAGNOSIS — R278 Other lack of coordination: Secondary | ICD-10-CM | POA: Diagnosis not present

## 2022-02-06 ENCOUNTER — Other Ambulatory Visit: Payer: Self-pay | Admitting: Internal Medicine

## 2022-02-08 ENCOUNTER — Ambulatory Visit: Payer: Self-pay | Admitting: Urology

## 2022-02-10 ENCOUNTER — Ambulatory Visit: Payer: Medicare PPO

## 2022-02-10 DIAGNOSIS — R278 Other lack of coordination: Secondary | ICD-10-CM | POA: Diagnosis not present

## 2022-02-10 DIAGNOSIS — M542 Cervicalgia: Secondary | ICD-10-CM | POA: Diagnosis not present

## 2022-02-10 DIAGNOSIS — R42 Dizziness and giddiness: Secondary | ICD-10-CM | POA: Diagnosis not present

## 2022-02-10 DIAGNOSIS — M6281 Muscle weakness (generalized): Secondary | ICD-10-CM

## 2022-02-10 DIAGNOSIS — R262 Difficulty in walking, not elsewhere classified: Secondary | ICD-10-CM | POA: Diagnosis not present

## 2022-02-10 DIAGNOSIS — R2681 Unsteadiness on feet: Secondary | ICD-10-CM

## 2022-02-10 DIAGNOSIS — M25571 Pain in right ankle and joints of right foot: Secondary | ICD-10-CM | POA: Diagnosis not present

## 2022-02-10 NOTE — Therapy (Signed)
OUTPATIENT PHYSICAL THERAPY VESTIBULAR TREATMENT     Patient Name: Kathy Long MRN: [id number] DOB:1950/12/15, 71 y.o., female Today's Date: 02/10/2022  PCP: [name], MD REFERRING PROVIDER: [name], MD   PT End of Session - 02/10/22 1653     Visit Number 11    Number of Visits 25    Date for PT Re-Evaluation 03/09/22    PT Start Time 0102    PT Stop Time 1651    PT Time Calculation (min) 44 min    Equipment Utilized During Treatment Gait belt    Activity Tolerance Patient tolerated treatment well    Behavior During Therapy WFL for tasks assessed/performed                      Past Medical History:  Diagnosis Date   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   Anxiety    Arthritis    lower back, hands   Cancer ([place])    melanoma, shoulder   Chronic kidney disease    Closed fracture of distal lateral malleolus of left ankle 12/28/2017   Depression    GERD (gastroesophageal reflux disease)    Hyperlipidemia    Hypertension    Postoperative bile leak    Sleep apnea    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    Past Surgical History:  Procedure Laterality Date   ANKLE FRACTURE SURGERY Left    APPENDECTOMY  2010   BACK SURGERY     disectomy lumbar, scar tissue   BREAST CYST EXCISION Right 1970   axilla   buninectomy Bilateral    CATARACT EXTRACTION W/PHACO Left 04/12/2018   Procedure: CATARACT EXTRACTION PHACO AND INTRAOCULAR LENS PLACEMENT ([place])  LEFT TORIC LENS;  Surgeon: Leandrew Koyanagi, MD;  Location: [facility];  Service: Ophthalmology;  Laterality: Left;   CATARACT EXTRACTION W/PHACO Right 05/10/2018   Procedure: CATARACT EXTRACTION PHACO AND INTRAOCULAR LENS PLACEMENT ([place])  RIGHT TORIC LENS;  Surgeon: Leandrew Koyanagi, MD;  Location: [place];  Service: Ophthalmology;  Laterality: Right;  DIABETIC   CHOLECYSTECTOMY N/A 07/05/2017   Procedure: LAPAROSCOPIC CHOLECYSTECTOMY WITH INTRAOPERATIVE CHOLANGIOGRAM;  Surgeon:  Robert Bellow, MD;  Location: [facility];  Service: General;  Laterality: N/A;   COLONOSCOPY     2009 and color gaurd in 2018   COLONOSCOPY N/A 03/25/2020   Procedure: COLONOSCOPY;  Surgeon: [name], MD;  Location: [facility] ENDOSCOPY;  Service: Endoscopy;  Laterality: N/A;   DRUG INDUCED ENDOSCOPY N/A 05/06/2021   Procedure: DRUG INDUCED SLEEP ENDOSCOPY;  Surgeon: Melida Quitter, MD;  Location: [place];  Service: ENT;  Laterality: N/A;   ERCP N/A 07/12/2017   Procedure: ENDOSCOPIC RETROGRADE CHOLANGIOPANCREATOGRAPHY (ERCP);  Surgeon: Lucilla Lame, MD;  Location: [facility] ENDOSCOPY;  Service: Endoscopy;  Laterality: N/A;   ERCP N/A 10/04/2017   Procedure: ENDOSCOPIC RETROGRADE CHOLANGIOPANCREATOGRAPHY (ERCP);  Surgeon: Lucilla Lame, MD;  Location: [facility] ENDOSCOPY;  Service: Endoscopy;  Laterality: N/A;   FOOT SURGERY     x 2   INCISIONAL HERNIA REPAIR N/A 12/04/2020   Procedure: VENTRAL INCISIONAL HERNIA REPAIR WITH MESH;  Surgeon: Armandina Gemma, MD;  Location: [facility];  Service: General;  Laterality: N/A;   MELANOMA EXCISION Left 2000   PALATOPLASTY N/A 04/08/2015   Procedure: PALATOPLASTY;  Surgeon: Beverly Gust, MD;  Location: [facility];  Service: ENT;  Laterality: N/A;   TONSILLECTOMY N/A 04/08/2015   Procedure: TONSILLECTOMY;  Surgeon: Beverly Gust, MD;  Location: [facility];  Service: ENT;  Laterality: N/A;   TUBAL LIGATION     Patient Active Problem List   Diagnosis Date Noted   Chronic pain of right ankle 11/23/2021   Overweight (BMI 25.0-29.9) 11/23/2021   Paroxysmal atrial fibrillation ([place]) 10/09/2021   Aortic atherosclerosis ([place]) 05/12/2021   History of COVID-19 05/12/2021   Positive colorectal cancer screening using Cologuard test 02/05/2020   Esophageal dysphagia 12/11/2019   Hyperlipidemia LDL goal <100 12/10/2019   Right shoulder pain 10/17/2019   Arthritis of right acromioclavicular joint 08/15/2019   Piriformis syndrome of left side 08/15/2019    OSA (obstructive sleep apnea) 05/13/2019   CKD (chronic kidney disease) stage 3, GFR 30-59 ml/min (HCC) 04/13/2018   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   S/P laparoscopic cholecystectomy 07/19/2017   Hospital discharge follow-up 07/19/2017   Acoustic neuroma ([place]) 06/12/2017   Colon cancer screening 03/08/2017   Nocturia 12/09/2016   Major depressive disorder, 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[place]) 06/05/2016   Encounter for preventive health examination 03/07/2015   Incisional hernia, without obstruction or gangrene 09/28/2014   Anxiety state 11/19/2013   Gastro-esophageal reflux disease without esophagitis 11/19/2013   Squamous cell carcinoma of skin of face 10/21/2011   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 Reports general balance seems to be ok. Reports continued ankle pain at about a 2/10, reports it is episodic. Her sinuses are still "popping when I swallow." Pt got an antihistamine today and she's going to try that tonight.  PAIN:  Are you having pain? Yes, R ankle soreness. 2/10.   PERTINENT HISTORY: Pt is here for vestibular PT evaluation regarding difficulties with balance and "unable to walk straight". Pt reports having been diagnosed with an acoustic neuroma in L ear about 6 yrs ago secondary to having balance issues. Went to physician and was sent to PT under the assumption the inner ear was the cause of the balance issues. Pt states that balance did not get any better and went  back to physician and had imaging done. Per pt, scan found the neuroma and plan was to monitor it. Gets imaging each year as a result and has show no changes as of now. Has been sent to an audiologist during this time and was given hearing aids. Pt recently went to PT for ankle s/p surgery for collapsed arch in January, reports no restrictions regarding ankle for therapy and feels PT helped tremendously. During surgery, pt reports she went into A-fib and is now seeing cardiologist. Was placed on several medications, and was having episodes of dizziness and hallucinations as side effects from one of the medications. Went back to her physician and dosages have been adjusted and reports no side effects since. Pt attributes all of these events to her balance being "off". Reports that sudden head movements can bring on the "off balance" sensation, as well as bending down. Pt reports she will get the feeling "something is off" and will notice she has veered to one side or the other. PMH is significant for: acoustic neuroma, arthritis, hx of other  cancer, chronic kidney disease, anxiety, depression and HTN. See chart for additional information.     PRECAUTIONS: Fall  WEIGHT BEARING RESTRICTIONS No  FALLS: Has patient fallen in last 6 months? Yes. Number of falls 1; in January fell on way to bathroom following ankle surgery, got off balance and cut head open on corner of chest, did not go to the ER, talked to PCP about head injury and reports PCP had no concerns.  LIVING ENVIRONMENT: deferred to next session secondary to limited time Lives with: lives with their family Lives in: Other town house Stairs:    Has following equipment at home:     PLOF: [place]: "be able to walk straight"  OBJECTIVE:  Objective data taken at eval unless otherwise specified   DIAGNOSTIC FINDINGS: Taken from 11/27/2021 MR BRAIN/IAC W WO CONTRAST: "IMPRESSION: 1. 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 2. Moderate chronic microvascular ischemic changes of the white matter in mild parenchymal volume loss, stable to mildly progressed since prior MRI."  COGNITION: Overall cognitive status: Within functional limits for tasks assessed    POSTURE: rounded shoulders and forward head   GAIT: Assistive device utilized: None Level of assistance: Complete Independence Comments: pt reports path deviations, unable to formally assess, plan to assess further next sessions, difficulty maintaining postural stability with scanning/head turns per pt report  PATIENT SURVEYS:  ABC scale 64% DHI 48: indicates moderate handicap FOTO 53 (goal score 59)   VESTIBULAR ASSESSMENT     SYMPTOM BEHAVIOR: Subjective history: complaints of imbalance, difficulty with turning head. Has acoustic neuroma (see subjective history above for details).   Non-Vestibular symptoms:  wears hearing aids B   Type of dizziness: Imbalance (Disequilibrium) and Unsteady with head/body turns   Frequency: with positional changes    Duration: unclear, will clarify future session   Aggravating factors: Induced by motion: occur when walking, bending down to the ground, turning body quickly, and turning head quickly   Relieving factors:  not reported   Progression of symptoms: unchanged   OCULOMOTOR EXAM:   Ocular Alignment: normal   Ocular ROM: No Limitations   Spontaneous Nystagmus: absent   Gaze-Induced Nystagmus: absent   Smooth Pursuits:  occasional saccadic intrusions   Saccades: hypometric/undershoots   Convergence/Divergence: WNL   VESTIBULAR - OCULAR REFLEX:    Slow VOR: Comment: noted corrective saccades with guided cervical rotation/VOR   VOR Cancellation: Comment: not tested   Head-Impulse Test: HIT Left: possible corrective saccade observed, however, pt with increased cervical stiffness B, had difficulty performing test. Attempted modified version/self-performed head impulse test, but not able to  achieve sufficient speed.    Dynamic Visual Acuity Test: Static: line 10, normal with no head movement Dynamic: line 6, impaired    VESTIBULAR TREATMENT: Today's treatment 02/10/2022  Amb outside over grass, pine needles, turns, up/down slopes, and with horiz, vertical, and diagonal head turns. Rates medium requires up to min assist to correct LOB.   Ascending/descending stairs with BUE>no UE support, reciprocal pattern x multiple reps, close CGA. Mild unsteadiness but able to correct.   6" box step-downs, cuing decreased speed, emphasis on control x multiple reps each LE  Sustained heel raise for balance 2x30 sec. Finger touch support   VORx1 with vertical and horizontal head turns, busy background with FWD/BCKWD gait 10x. Mild unsteadiness, but no dizziness.  Semi-tandem over 10 meters 4x. Improved, few instances of step-strategy to self-correct successfully for LOB.  Manual:  Seated in chair - IASTM B  UT, posterior shoulder musculature and TrP release along B cervical parapspinals while pt performs long duration UT and cervical rotation stretches. TrPs found throughout posterior shoulder musculature and cervical paraspinals. Reports improvement with manual    PATIENT EDUCATION: Education details: Pt educated throughout session about proper posture and technique with exercises. Improved exercise technique, movement at target joints, use of target muscles after min to mod verbal, visual, tactile cues.   Person educated: Patient Education method: Explanation, Demonstration, and Verbal cues Education comprehension: verbalized understanding, returned demonstration, verbal cues required, and needs further education   GOALS: Goals reviewed with patient? Yes  SHORT TERM GOALS: Target date: 03/24/2022   Pt will be independent in home exercise program to improve strength/mobility for better functional independence with ADLs. Baseline: 7/18: initiate next visit; 8/9: initiated VORx1  seated; 9/8: HEP further advanced Goal status: IN PROGRESS   LONG TERM GOALS: Target date: 05/05/2022    Pt will increase FOTO score to at least 59 to demonstrate significant improvement in mobility and quality of life. Baseline: 7/18: 53, 9/8: 59 Goal status: MET  2.  Pt will reduce dizziness handicap inventory score to < 35, for less dizziness with ADLs and increased safety with home and community tasks. Baseline: 7/18: 48; 9/8: 24 Goal status: MET  3.  Pt will increase ABC scale score > 80% to demonstrate better functional mobility and better confidence with ADLs. Baseline: 7/18: 64%; 9/8: 71.88% Goal status: IN PROGRESS  4.  Pt will increase DGI score to at least 23/24 as to reduce fall risk and improve dynamic gait safety with community ambulation.  Baseline: 7/18: deferred to next session; 8/9: revised from FGA to DGI. Pt scored 21/24 on this date; 9/8: 22/24 Goal status:Partially MET  HOME EXERCISE PROGRAM:   Updated 9/8: Access Code: [email or web address] URL: [email or web address] Date: 02/05/2022 Prepared by: Ricard Dillon  Exercises - Corner Balance Feet Apart: Eyes Open With Head Turns  - 1 x daily - 7 x weekly - 2 sets - 15 reps - Shoulder External Rotation and Scapular Retraction with Resistance  - 1 x daily - 5 x weekly - 3 sets - 10 reps  Previous: 8/25: reinforced importance of performing all of HEP, specifically VOR intervention  Added 8/18: Access Code: [email or web address] URL: [email or web address] Date: 01/15/2022 Prepared by: Ricard Dillon  Exercises - Supine Chin Tuck  - 1 x daily - 5 x weekly - 2 sets - 10 reps - 3 seconds hold  01/06/22: Seated VORx1 vertical and horizontal head turns 3 minutes total of each performed in up to 60 second bouts. Instructed to rest if dizziness reaches >2/10.   ASSESSMENT:  CLINICAL IMPRESSION: Pt continues to show progress AEB performing more advanced VOR and dynamic balance exercises, requiring often no more than  CGA and occ min assist. Pt also with improved performance of tandem gait today. The pt will benefit from further skilled PT to improve balance, dizziness, pain and mobility.    OBJECTIVE IMPAIRMENTS Abnormal gait, decreased activity tolerance, decreased balance, decreased coordination, decreased endurance, decreased knowledge of use of DME, decreased mobility, difficulty walking, decreased ROM, dizziness, hypomobility, impaired vision/preception, improper body mechanics, and postural dysfunction.   ACTIVITY LIMITATIONS bending, sitting, standing, squatting, stairs, transfers, dressing, locomotion level, and caring for others  PARTICIPATION LIMITATIONS: cleaning, laundry, interpersonal relationship, shopping, community activity, and yard work  PERSONAL FACTORS Age, Past/current experiences, Sex, Time since onset of injury/illness/exacerbation, and 3+ comorbidities: acoustic neuroma, arthritis, chronic kidney disease, anxiety, depression, hx of other cancer, and  HTN  are also affecting patient's functional outcome.   REHAB POTENTIAL: Good  CLINICAL DECISION MAKING: Evolving/moderate complexity  EVALUATION COMPLEXITY: Moderate   PLAN: PT FREQUENCY: 2x/week  PT DURATION: 12 weeks  PLANNED INTERVENTIONS: Therapeutic exercises, Therapeutic activity, Neuromuscular re-education, Balance training, Gait training, Patient/Family education, Self Care, Joint mobilization, Stair training, Vestibular training, Canalith repositioning, Visual/preceptual remediation/compensation, DME instructions, Dry Needling, Electrical stimulation, Spinal mobilization, Cryotherapy, Moist heat, Compression bandaging, Taping, Manual therapy, and Re-evaluation  PLAN FOR NEXT SESSION: strength, balance, manual therapy, VOR, stairs, continue plan  Ricard Dillon PT, DPT  02/10/2022, 5:00 PM

## 2022-02-15 ENCOUNTER — Encounter: Payer: Self-pay | Admitting: Internal Medicine

## 2022-02-15 ENCOUNTER — Encounter: Payer: Self-pay | Admitting: Adult Health

## 2022-02-15 ENCOUNTER — Ambulatory Visit: Payer: Medicare PPO | Admitting: Adult Health

## 2022-02-15 DIAGNOSIS — G4733 Obstructive sleep apnea (adult) (pediatric): Secondary | ICD-10-CM | POA: Diagnosis not present

## 2022-02-15 NOTE — Assessment & Plan Note (Signed)
Moderate to severe sleep apnea history.  Patient has lost around 40 pounds.  Symptom burden has decreased.  She has tried CPAP multiple times and has been unsuccessful.  Patient has been evaluated for the inspire device and has been considered a successful candidate.  For now she wants to repeat her home sleep study to make sure she has sleep apnea.  If still present and at least moderate would consider going forward with the inspire device.  We will hold for now until home sleep study is completed.  She says that she has been told that she should receive her sleep study machine in the next week.  Plan  Patient Instructions  Call back when you have your sleep study results.  Healthy sleep regimen  Do not drive if you are sleepy  Follow up with Dr. Mortimer Fries in 1 year and As needed

## 2022-02-15 NOTE — Progress Notes (Signed)
$'@Patient't$  ID: Kathy Long, female    DOB: 27-May-1951, 71 y.o.   MRN: [id number]  Chief Complaint  Patient presents with   Follow-up    Referring provider: [name], MD  HPI: 71 year old seen for sleep consult February 05, 2021 to establish for sleep apnea  TEST/EVENTS :  Sleep study May 10, 2019 showed moderate sleep apnea with AHI 28.6/hour  02/15/2022 Follow up : OSA Patient returns for a 1 year follow-up.  Patient has had difficulty wearing CPAP since diagnosis of sleep apnea in 2020.  She has tried multiple different CPAP mask and pressure settings.  Has been unable to tolerate.  Last visit she was referred to ENT for evaluation of the inspire device.  She was seen by Dr. [name] and underwent the drug induced sleep endoscopy.  This showed a 90% anterior posterior collapse making her a good candidate for the hypoglossal nerve stimulator placement.  Patient says that she has been working on weight loss and is down almost 40 pounds.  She says that she has not been snoring.  Does not have as much daytime sleepiness.  She has decided to have a repeat sleep study to see if possibly her sleep apnea went away.  If it has not gone away then she will consider going forward with the inspire device.  She says she absolutely cannot tolerate CPAP she has tried it and it makes her miserable.  Was diagnosed with A-fib earlier this year after foot surgery.  Now on Eliquis.  Allergies  Allergen Reactions   Morphine And Related Other (See Comments)    Chest pain   Tramadol Other (See Comments)    tremors   Tape Rash         Immunization History  Administered Date(s) Administered   Influenza, High Dose Seasonal PF 03/05/2015, 03/16/2017, 02/28/2018, 02/03/2019   Influenza-Unspecified 05/05/2016, 03/16/2017, 04/18/2020   Moderna Sars-Covid-2 Vaccination 07/16/2019, 08/13/2019, 04/18/2020, 01/06/2021   PNEUMOCOCCAL CONJUGATE-20 10/09/2021   Pneumococcal Conjugate-13 05/05/2016    Pneumococcal Polysaccharide-23 01/31/2014   Tdap 06/10/2017   Zoster Recombinat (Shingrix) 03/23/2018, 03/23/2019   Zoster, Live 02/28/2013    Past Medical History:  Diagnosis Date   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   Anxiety    Arthritis    lower back, hands   Cancer ([facility])    melanoma, shoulder   Chronic kidney disease    Closed fracture of distal lateral malleolus of left ankle 12/28/2017   Depression    GERD (gastroesophageal reflux disease)    Hyperlipidemia    Hypertension    Postoperative bile leak    Sleep apnea    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     Tobacco History: Social History   Tobacco Use  Smoking Status Never  Smokeless Tobacco Never  Tobacco Comments   smoked socially in college   Counseling given: Not Answered Tobacco comments: smoked socially in college   Outpatient Medications Prior to Visit  Medication Sig Dispense Refill   ALPRAZolam (XANAX) 0.25 MG tablet Take 1 tablet (0.25 mg total) by mouth 2 (two) times daily as needed for anxiety. 20 tablet 0   amLODipine (NORVASC) 2.5 MG tablet Take 1 tablet (2.5 mg total) by mouth daily. 90 tablet 1   apixaban (ELIQUIS) 5 MG TABS tablet Take 1 tablet (5 mg total) by mouth 2 (two) times daily. 180 tablet 3   benzonatate (TESSALON) 100 MG capsule Take 2 capsules (200 mg total) by mouth 3 (three)  times daily as needed for cough. 30 capsule 0   Cholecalciferol (VITAMIN D3) 75 MCG (3000 UT) TABS Take 6,000 Units by mouth daily.     citalopram (CELEXA) 20 MG tablet TAKE 1 TABLET BY MOUTH DAILY 90 tablet 1   clobetasol (TEMOVATE) 0.05 % external solution APPLY DAILY UNTIL RASH IS CLEAR 50 mL 0   Coenzyme Q10 100 MG capsule Take 100 mg by mouth daily.     flecainide (TAMBOCOR) 100 MG tablet Take 100 mg (1 tablet) in the morning and 50 mg (1/2 tablet) in the evening. 126 tablet 3   melatonin 5 MG TABS Take 5 mg by mouth at bedtime.     metoprolol succinate (TOPROL-XL) 50 MG 24 hr tablet Take 1 tablet (50 mg total)  by mouth daily. 90 tablet 3   MOUNJARO 7.5 MG/0.5ML Pen INJECT 7.'5MG'$  INTO THE SKIN ONCE A WEEK AS DIRECTED 2 mL 0   omeprazole (PRILOSEC) 40 MG capsule TAKE 1 CAPSULE BY MOUTH ONCE DAILY 90 capsule 1   ondansetron (ZOFRAN) 4 MG tablet TAKE 1 TABLET BY MOUTH EVERY 8 HOURS AS NEEDED FOR NAUSEA AND VOMITING 20 tablet 0   rosuvastatin (CRESTOR) 10 MG tablet TAKE ONE TABLET BY MOUTH EVERY DAY 90 tablet 1   tiZANidine (ZANAFLEX) 4 MG tablet Take 1 tablet (4 mg total) by mouth every 6 (six) hours as needed for muscle spasms. 30 tablet 2   UNABLE TO FIND Take 1 tablet by mouth daily. Med Name: [employer] Fruit [employer] Veggie Vitamin     COLLAGEN PO Take 1 tablet by mouth daily.     predniSONE (DELTASONE) 10 MG tablet 6 tablets on Day 1 , then reduce by 1 tablet daily until gone 21 tablet 0   No facility-administered medications prior to visit.     Review of Systems:   Constitutional:   No  weight loss, night sweats,  Fevers, chills, fatigue, or  lassitude.  HEENT:   No headaches,  Difficulty swallowing,  Tooth/dental problems, or  Sore throat,                No sneezing, itching, ear ache, nasal congestion, post nasal drip,   CV:  No chest pain,  Orthopnea, PND, swelling in lower extremities, anasarca, dizziness, palpitations, syncope.   GI  No heartburn, indigestion, abdominal pain, nausea, vomiting, diarrhea, change in bowel habits, loss of appetite, bloody stools.   Resp: No shortness of breath with exertion or at rest.  No excess mucus, no productive cough,  No non-productive cough,  No coughing up of blood.  No change in color of mucus.  No wheezing.  No chest wall deformity  Skin: no rash or lesions.  GU: no dysuria, change in color of urine, no urgency or frequency.  No flank pain, no hematuria   MS:  No joint pain or swelling.  No decreased range of motion.  No back pain.    Physical Exam  BP 136/82 (BP Location: Left Arm, Cuff Size: Normal)   Pulse 63   Temp 97.9 F (36.6 C)  (Temporal)   Ht '5\' 2"'$  (1.575 m)   Wt 146 lb 6.4 oz (66.4 kg)   SpO2 96%   BMI 26.78 kg/m   GEN: A/Ox3; pleasant , NAD, well nourished    HEENT:  [place]/AT,   NOSE-clear, THROAT-clear, no lesions, no postnasal drip or exudate noted. Class 2-3 MP airway   NECK:  Supple w/ fair ROM; no JVD; normal carotid impulses w/o bruits;  no thyromegaly or nodules palpated; no lymphadenopathy.    RESP  Clear  P & A; w/o, wheezes/ rales/ or rhonchi. no accessory muscle use, no dullness to percussion  CARD:  RRR, no m/r/g, no peripheral edema, pulses intact, no cyanosis or clubbing.  GI:   Soft & nt; nml bowel sounds; no organomegaly or masses detected.   Musco: Warm bil, no deformities or joint swelling noted.   Neuro: alert, no focal deficits noted.    Skin: Warm, no lesions or rashes    Lab Results:  CBC    BNP No results found for: "BNP"  ProBNP No results found for: "PROBNP"  Imaging: No results found.        No data to display          No results found for: "NITRICOXIDE"      Assessment & Plan:   OSA (obstructive sleep apnea) Moderate to severe sleep apnea history.  Patient has lost around 40 pounds.  Symptom burden has decreased.  She has tried CPAP multiple times and has been unsuccessful.  Patient has been evaluated for the inspire device and has been considered a successful candidate.  For now she wants to repeat her home sleep study to make sure she has sleep apnea.  If still present and at least moderate would consider going forward with the inspire device.  We will hold for now until home sleep study is completed.  She says that she has been told that she should receive her sleep study machine in the next week.  Plan  Patient Instructions  Call back when you have your sleep study results.  Healthy sleep regimen  Do not drive if you are sleepy  Follow up with Dr. Mortimer Fries in 1 year and As needed          Rexene Edison, NP 02/15/2022

## 2022-02-15 NOTE — Patient Instructions (Signed)
Call back when you have your sleep study results.  Healthy sleep regimen  Do not drive if you are sleepy  Follow up with Dr. Mortimer Fries in 1 year and As needed

## 2022-02-17 ENCOUNTER — Ambulatory Visit: Payer: Medicare PPO

## 2022-02-17 DIAGNOSIS — R278 Other lack of coordination: Secondary | ICD-10-CM | POA: Diagnosis not present

## 2022-02-17 DIAGNOSIS — R2681 Unsteadiness on feet: Secondary | ICD-10-CM | POA: Diagnosis not present

## 2022-02-17 DIAGNOSIS — M542 Cervicalgia: Secondary | ICD-10-CM | POA: Diagnosis not present

## 2022-02-17 DIAGNOSIS — M6281 Muscle weakness (generalized): Secondary | ICD-10-CM | POA: Diagnosis not present

## 2022-02-17 DIAGNOSIS — R42 Dizziness and giddiness: Secondary | ICD-10-CM

## 2022-02-17 DIAGNOSIS — R262 Difficulty in walking, not elsewhere classified: Secondary | ICD-10-CM

## 2022-02-17 DIAGNOSIS — M25571 Pain in right ankle and joints of right foot: Secondary | ICD-10-CM | POA: Diagnosis not present

## 2022-02-17 NOTE — Therapy (Signed)
OUTPATIENT PHYSICAL THERAPY VESTIBULAR TREATMENT     Patient Name: Kathy Long MRN: [id number] DOB:1950-06-17, 71 y.o., female Today's Date: 02/17/2022  PCP: [name], MD REFERRING PROVIDER: [name], MD   PT End of Session - 02/17/22 1546     Visit Number 12    Number of Visits 25    Date for PT Re-Evaluation 03/09/22    PT Start Time 0931    PT Stop Time 1014    PT Time Calculation (min) 43 min    Equipment Utilized During Treatment Gait belt    Activity Tolerance Patient tolerated treatment well    Behavior During Therapy WFL for tasks assessed/performed                       Past Medical History:  Diagnosis Date   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   Anxiety    Arthritis    lower back, hands   Cancer ([place])    melanoma, shoulder   Chronic kidney disease    Closed fracture of distal lateral malleolus of left ankle 12/28/2017   Depression    GERD (gastroesophageal reflux disease)    Hyperlipidemia    Hypertension    Postoperative bile leak    Sleep apnea    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    Past Surgical History:  Procedure Laterality Date   ANKLE FRACTURE SURGERY Left    APPENDECTOMY  2010   BACK SURGERY     disectomy lumbar, scar tissue   BREAST CYST EXCISION Right 1970   axilla   buninectomy Bilateral    CATARACT EXTRACTION W/PHACO Left 04/12/2018   Procedure: CATARACT EXTRACTION PHACO AND INTRAOCULAR LENS PLACEMENT ([place])  LEFT TORIC LENS;  Surgeon: Leandrew Koyanagi, MD;  Location: [facility];  Service: Ophthalmology;  Laterality: Left;   CATARACT EXTRACTION W/PHACO Right 05/10/2018   Procedure: CATARACT EXTRACTION PHACO AND INTRAOCULAR LENS PLACEMENT ([place])  RIGHT TORIC LENS;  Surgeon: Leandrew Koyanagi, MD;  Location: [facility];  Service: Ophthalmology;  Laterality: Right;  DIABETIC   CHOLECYSTECTOMY N/A 07/05/2017   Procedure: LAPAROSCOPIC CHOLECYSTECTOMY WITH INTRAOPERATIVE CHOLANGIOGRAM;   Surgeon: Robert Bellow, MD;  Location: [facility];  Service: General;  Laterality: N/A;   COLONOSCOPY     2009 and color gaurd in 2018   COLONOSCOPY N/A 03/25/2020   Procedure: COLONOSCOPY;  Surgeon: [name], MD;  Location: [facility];  Service: Endoscopy;  Laterality: N/A;   DRUG INDUCED ENDOSCOPY N/A 05/06/2021   Procedure: DRUG INDUCED SLEEP ENDOSCOPY;  Surgeon: Melida Quitter, MD;  Location: [facility];  Service: ENT;  Laterality: N/A;   ERCP N/A 07/12/2017   Procedure: ENDOSCOPIC RETROGRADE CHOLANGIOPANCREATOGRAPHY (ERCP);  Surgeon: Lucilla Lame, MD;  Location: [facility]COPY;  Service: Endoscopy;  Laterality: N/A;   ERCP N/A 10/04/2017   Procedure: ENDOSCOPIC RETROGRADE CHOLANGIOPANCREATOGRAPHY (ERCP);  Surgeon: Lucilla Lame, MD;  Location: [facility] ENDOSCOPY;  Service: Endoscopy;  Laterality: N/A;   FOOT SURGERY     x 2   INCISIONAL HERNIA REPAIR N/A 12/04/2020   Procedure: VENTRAL INCISIONAL HERNIA REPAIR WITH MESH;  Surgeon: Armandina Gemma, MD;  Location: [facility];  Service: General;  Laterality: N/A;   MELANOMA EXCISION Left 2000   PALATOPLASTY N/A 04/08/2015   Procedure: PALATOPLASTY;  Surgeon: Beverly Gust, MD;  Location: [facility];  Service: ENT;  Laterality: N/A;   TONSILLECTOMY N/A 04/08/2015   Procedure: TONSILLECTOMY;  Surgeon: Beverly Gust, MD;  Location:  [facility];  Service: ENT;  Laterality: N/A;   TUBAL LIGATION     Patient Active Problem List   Diagnosis Date Noted   Chronic pain of right ankle 11/23/2021   Overweight (BMI 25.0-29.9) 11/23/2021   Paroxysmal atrial fibrillation ([place]) 10/09/2021   Aortic atherosclerosis ([place]) 05/12/2021   History of COVID-19 05/12/2021   Positive colorectal cancer screening using Cologuard test 02/05/2020   Esophageal dysphagia 12/11/2019   Hyperlipidemia LDL goal <100 12/10/2019   Right shoulder pain 10/17/2019   Arthritis of right acromioclavicular joint 08/15/2019   Piriformis syndrome of left side  08/15/2019   OSA (obstructive sleep apnea) 05/13/2019   CKD (chronic kidney disease) stage 3, GFR 30-59 ml/min (HCC) 04/13/2018   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   S/P laparoscopic cholecystectomy 07/19/2017   Hospital discharge follow-up 07/19/2017   Acoustic neuroma ([place]) 06/12/2017   Colon cancer screening 03/08/2017   Nocturia 12/09/2016   Major depressive disorder, 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[place]) 06/05/2016   Encounter for preventive health examination 03/07/2015   Incisional hernia, without obstruction or gangrene 09/28/2014   Anxiety state 11/19/2013   Gastro-esophageal reflux disease without esophagitis 11/19/2013   Squamous cell carcinoma of skin of face 10/21/2011   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 She reports she does not plan to take it again. She is planning to call Dr. Tami Ribas today. Pt reports overall she has not been dizzy lately. She thinks her balance is maybe a little better, still has difficulty with turns and can feel unsteady. She reports no neck pain. Her ankle has been feeling better since she changed her shoes.   PAIN:  Are you having pain? No   PERTINENT HISTORY: Pt is here for vestibular PT evaluation regarding difficulties with balance and "unable to walk straight". Pt reports having been diagnosed with an acoustic neuroma in L ear about 6 yrs ago secondary to having balance issues. Went  to physician and was sent to PT under the assumption the inner ear was the cause of the balance issues. Pt states that balance did not get any better and went back to physician and had imaging done. Per pt, scan found the neuroma and plan was to monitor it. Gets imaging each year as a result and has show no changes as of now. Has been sent to an audiologist during this time and was given hearing aids. Pt recently went to PT for ankle s/p surgery for collapsed arch in January, reports no restrictions regarding ankle for therapy and feels PT helped tremendously. During surgery, pt reports she went into A-fib and is now seeing cardiologist. Was placed on several medications, and was having episodes of dizziness and hallucinations as side effects from one of the medications. Went back to her physician and dosages have been adjusted and reports no side effects since. Pt attributes all of these events to her balance being "off". Reports that sudden head movements can bring on the "off balance" sensation, as well  as bending down. Pt reports she will get the feeling "something is off" and will notice she has veered to one side or the other. PMH is significant for: acoustic neuroma, arthritis, hx of other cancer, chronic kidney disease, anxiety, depression and HTN. See chart for additional information.     PRECAUTIONS: Fall  WEIGHT BEARING RESTRICTIONS No  FALLS: Has patient fallen in last 6 months? Yes. Number of falls 1; in January fell on way to bathroom following ankle surgery, got off balance and cut head open on corner of chest, did not go to the ER, talked to PCP about head injury and reports PCP had no concerns.  LIVING ENVIRONMENT: deferred to next session secondary to limited time Lives with: lives with their family Lives in: Other town house Stairs:    Has following equipment at home:     PLOF: [place]: "be able to walk straight"  OBJECTIVE:  Objective data taken at eval  unless otherwise specified   DIAGNOSTIC FINDINGS: Taken from 11/27/2021 MR BRAIN/IAC W WO CONTRAST: "IMPRESSION: 1. 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 2. Moderate chronic microvascular ischemic changes of the white matter in mild parenchymal volume loss, stable to mildly progressed since prior MRI."  COGNITION: Overall cognitive status: Within functional limits for tasks assessed    POSTURE: rounded shoulders and forward head   GAIT: Assistive device utilized: None Level of assistance: Complete Independence Comments: pt reports path deviations, unable to formally assess, plan to assess further next sessions, difficulty maintaining postural stability with scanning/head turns per pt report  PATIENT SURVEYS:  ABC scale 64% DHI 48: indicates moderate handicap FOTO 53 (goal score 59)   VESTIBULAR ASSESSMENT     SYMPTOM BEHAVIOR: Subjective history: complaints of imbalance, difficulty with turning head. Has acoustic neuroma (see subjective history above for details).   Non-Vestibular symptoms:  wears hearing aids B   Type of dizziness: Imbalance (Disequilibrium) and Unsteady with head/body turns   Frequency: with positional changes    Duration: unclear, will clarify future session   Aggravating factors: Induced by motion: occur when walking, bending down to the ground, turning body quickly, and turning head quickly   Relieving factors:  not reported   Progression of symptoms: unchanged   OCULOMOTOR EXAM:   Ocular Alignment: normal   Ocular ROM: No Limitations   Spontaneous Nystagmus: absent   Gaze-Induced Nystagmus: absent   Smooth Pursuits:  occasional saccadic intrusions   Saccades: hypometric/undershoots   Convergence/Divergence: WNL   VESTIBULAR - OCULAR REFLEX:    Slow VOR: Comment: noted corrective saccades with guided cervical rotation/VOR   VOR Cancellation: Comment: not tested   Head-Impulse Test: HIT Left: possible corrective saccade observed,  however, pt with increased cervical stiffness B, had difficulty performing test. Attempted modified version/self-performed head impulse test, but not able to achieve sufficient speed.    Dynamic Visual Acuity Test: Static: line 10, normal with no head movement Dynamic: line 6, impaired    VESTIBULAR TREATMENT: Today's treatment 02/17/2022  NMR: Amb. Over 10 meters with sudden stopping and turning 180 deg. X multiple reps. Intermittent step-strategy used throughout to correct for LOB. Pt provides up to min assist.  Amb. Over 10 meters with horizontal  head turns 2x. No significant deviation noted.  Amb around cones in a circle (short diameter) - turning B directions x multiple reps of each. Difficult for pt. Recreates unsteady feeling and occ dizziness. Pt provides up to min assist.  Turning in circle in place, slowly -  recreates dizziness. CGA  Body turns against wall 4 rounds of 2-3 turns. Dizziness increased to 3/10, requiring recovery intervals.  Tandem walking over 10 m 4x - reports feels easier, still with occ use of step strategy to correct for LOB. CGA.  SLB 3x30 sec each LE at support surface. Requires intermittent UE support.  Standing on airex: VORx1 with vertical, horizontal head turns 2x30 sec of each. No dizziness NBOS EC 2x30 sec; able to perform hands-free Slow march on airex x multiple reps. Somewhat difficult to improve eccentric control.    PATIENT EDUCATION: Education details: Pt educated throughout session about proper posture and technique with exercises. Improved exercise technique, movement at target joints, use of target muscles after min to mod verbal, visual, tactile cues.   Person educated: Patient Education method: Explanation, Demonstration, and Verbal cues Education comprehension: verbalized understanding, returned demonstration, verbal cues required, and needs further education   GOALS: Goals reviewed with patient? Yes  SHORT TERM GOALS: Target  date: 03/31/2022   Pt will be independent in home exercise program to improve strength/mobility for better functional independence with ADLs. Baseline: 7/18: initiate next visit; 8/9: initiated VORx1 seated; 9/8: HEP further advanced Goal status: IN PROGRESS   LONG TERM GOALS: Target date: 05/12/2022    Pt will increase FOTO score to at least 59 to demonstrate significant improvement in mobility and quality of life. Baseline: 7/18: 53, 9/8: 59 Goal status: MET  2.  Pt will reduce dizziness handicap inventory score to < 35, for less dizziness with ADLs and increased safety with home and community tasks. Baseline: 7/18: 48; 9/8: 24 Goal status: MET  3.  Pt will increase ABC scale score > 80% to demonstrate better functional mobility and better confidence with ADLs. Baseline: 7/18: 64%; 9/8: 71.88% Goal status: IN PROGRESS  4.  Pt will increase DGI score to at least 23/24 as to reduce fall risk and improve dynamic gait safety with community ambulation.  Baseline: 7/18: deferred to next session; 8/9: revised from FGA to DGI. Pt scored 21/24 on this date; 9/8: 22/24 Goal status:Partially MET  HOME EXERCISE PROGRAM:   Updated 9/8: Access Code: [email or web address] URL: [email or web address] Date: 02/05/2022 Prepared by: Ricard Dillon  Exercises - Corner Balance Feet Apart: Eyes Open With Head Turns  - 1 x daily - 7 x weekly - 2 sets - 15 reps - Shoulder External Rotation and Scapular Retraction with Resistance  - 1 x daily - 5 x weekly - 3 sets - 10 reps  Previous: 8/25: reinforced importance of performing all of HEP, specifically VOR intervention  Added 8/18: Access Code: [email or web address] URL: [email or web address] Date: 01/15/2022 Prepared by: Ricard Dillon  Exercises - Supine Chin Tuck  - 1 x daily - 5 x weekly - 2 sets - 10 reps - 3 seconds hold  01/06/22: Seated VORx1 vertical and horizontal head turns 3 minutes total of each performed in up to 60 second bouts.  Instructed to rest if dizziness reaches >2/10.   ASSESSMENT:  CLINICAL IMPRESSION: Today's session focused primarily on turning movements as this tends to trigger pt's unsteadiness most per her report. Pt did exhibit increased use of step-strategies with turning and required up to min assist this session. She showed improvement, however, in ability to maintain postural stability on airex with VOR and with EC. The pt will benefit from further skilled PT to improve balance, dizziness, pain and mobility.    OBJECTIVE IMPAIRMENTS Abnormal gait, decreased activity tolerance, decreased balance, decreased coordination, decreased endurance, decreased knowledge  of use of DME, decreased mobility, difficulty walking, decreased ROM, dizziness, hypomobility, impaired vision/preception, improper body mechanics, and postural dysfunction.   ACTIVITY LIMITATIONS bending, sitting, standing, squatting, stairs, transfers, dressing, locomotion level, and caring for others  PARTICIPATION LIMITATIONS: cleaning, laundry, interpersonal relationship, shopping, community activity, and yard work  PERSONAL FACTORS Age, Past/current experiences, Sex, Time since onset of injury/illness/exacerbation, and 3+ comorbidities: acoustic neuroma, arthritis, chronic kidney disease, anxiety, depression, hx of other cancer, and HTN  are also affecting patient's functional outcome.   REHAB POTENTIAL: Good  CLINICAL DECISION MAKING: Evolving/moderate complexity  EVALUATION COMPLEXITY: Moderate   PLAN: PT FREQUENCY: 2x/week  PT DURATION: 12 weeks  PLANNED INTERVENTIONS: Therapeutic exercises, Therapeutic activity, Neuromuscular re-education, Balance training, Gait training, Patient/Family education, Self Care, Joint mobilization, Stair training, Vestibular training, Canalith repositioning, Visual/preceptual remediation/compensation, DME instructions, Dry Needling, Electrical stimulation, Spinal mobilization, Cryotherapy, Moist  heat, Compression bandaging, Taping, Manual therapy, and Re-evaluation  PLAN FOR NEXT SESSION: strength, balance, manual therapy, VOR, stairs, continue plan  Ricard Dillon PT, DPT  02/17/2022, 4:06 PM

## 2022-02-19 ENCOUNTER — Ambulatory Visit: Payer: Medicare PPO

## 2022-02-22 ENCOUNTER — Other Ambulatory Visit: Payer: Self-pay | Admitting: Family

## 2022-02-24 ENCOUNTER — Ambulatory Visit (INDEPENDENT_AMBULATORY_CARE_PROVIDER_SITE_OTHER): Payer: Medicare PPO

## 2022-02-24 ENCOUNTER — Ambulatory Visit: Payer: Medicare PPO | Admitting: Podiatry

## 2022-02-24 ENCOUNTER — Ambulatory Visit: Payer: Medicare PPO

## 2022-02-24 DIAGNOSIS — R278 Other lack of coordination: Secondary | ICD-10-CM | POA: Diagnosis not present

## 2022-02-24 DIAGNOSIS — M25571 Pain in right ankle and joints of right foot: Secondary | ICD-10-CM | POA: Diagnosis not present

## 2022-02-24 DIAGNOSIS — R2681 Unsteadiness on feet: Secondary | ICD-10-CM | POA: Diagnosis not present

## 2022-02-24 DIAGNOSIS — R262 Difficulty in walking, not elsewhere classified: Secondary | ICD-10-CM | POA: Diagnosis not present

## 2022-02-24 DIAGNOSIS — M19279 Secondary osteoarthritis, unspecified ankle and foot: Secondary | ICD-10-CM

## 2022-02-24 DIAGNOSIS — M2041 Other hammer toe(s) (acquired), right foot: Secondary | ICD-10-CM

## 2022-02-24 DIAGNOSIS — M6281 Muscle weakness (generalized): Secondary | ICD-10-CM | POA: Diagnosis not present

## 2022-02-24 DIAGNOSIS — M19271 Secondary osteoarthritis, right ankle and foot: Secondary | ICD-10-CM

## 2022-02-24 DIAGNOSIS — R42 Dizziness and giddiness: Secondary | ICD-10-CM | POA: Diagnosis not present

## 2022-02-24 DIAGNOSIS — M542 Cervicalgia: Secondary | ICD-10-CM | POA: Diagnosis not present

## 2022-02-24 NOTE — Therapy (Signed)
OUTPATIENT PHYSICAL THERAPY VESTIBULAR TREATMENT     Patient Name: Kathy Long MRN: [id number] DOB:Oct 31, 1950, 71 y.o., female Today's Date: 02/24/2022  PCP: Sherlene Shams, MD REFERRING PROVIDER: Sherlene Shams, MD   PT End of Session - 02/24/22 1601     Visit Number 13    Number of Visits 25    Date for PT Re-Evaluation 03/09/22    PT Start Time 1603    PT Stop Time 1644    PT Time Calculation (min) 41 min    Equipment Utilized During Treatment Gait belt    Activity Tolerance Patient tolerated treatment well    Behavior During Therapy WFL for tasks assessed/performed                        Past Medical History:  Diagnosis Date   Acoustic neuroma (HCC)    left ear   Anxiety    Arthritis    lower back, hands   Cancer (HCC)    melanoma, shoulder   Chronic kidney disease    Closed fracture of distal lateral malleolus of left ankle 12/28/2017   Depression    GERD (gastroesophageal reflux disease)    Hyperlipidemia    Hypertension    Postoperative bile leak    Sleep apnea    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    Past Surgical History:  Procedure Laterality Date   ANKLE FRACTURE SURGERY Left    APPENDECTOMY  2010   BACK SURGERY     disectomy lumbar, scar tissue   BREAST CYST EXCISION Right 1970   axilla   buninectomy Bilateral    CATARACT EXTRACTION W/PHACO Left 04/12/2018   Procedure: CATARACT EXTRACTION PHACO AND INTRAOCULAR LENS PLACEMENT (IOC)  LEFT TORIC LENS;  Surgeon: Lockie Mola, MD;  Location: [facility];  Service: Ophthalmology;  Laterality: Left;   CATARACT EXTRACTION W/PHACO Right 05/10/2018   Procedure: CATARACT EXTRACTION PHACO AND INTRAOCULAR LENS PLACEMENT (IOC)  RIGHT TORIC LENS;  Surgeon: Lockie Mola, MD;  Location: [facility];  Service: Ophthalmology;  Laterality: Right;  DIABETIC   CHOLECYSTECTOMY N/A 07/05/2017   Procedure: LAPAROSCOPIC CHOLECYSTECTOMY WITH INTRAOPERATIVE CHOLANGIOGRAM;   Surgeon: Earline Mayotte, MD;  Location: [facility];  Service: General;  Laterality: N/A;   COLONOSCOPY     2009 and color gaurd in 2018   COLONOSCOPY N/A 03/25/2020   Procedure: COLONOSCOPY;  Surgeon: Regis Bill, MD;  Location: [facility];  Service: Endoscopy;  Laterality: N/A;   DRUG INDUCED ENDOSCOPY N/A 05/06/2021   Procedure: DRUG INDUCED SLEEP ENDOSCOPY;  Surgeon: Christia Reading, MD;  Location: [facility];  Service: ENT;  Laterality: N/A;   ERCP N/A 07/12/2017   Procedure: ENDOSCOPIC RETROGRADE CHOLANGIOPANCREATOGRAPHY (ERCP);  Surgeon: Midge Minium, MD;  Location: [facility];  Service: Endoscopy;  Laterality: N/A;   ERCP N/A 10/04/2017   Procedure: ENDOSCOPIC RETROGRADE CHOLANGIOPANCREATOGRAPHY (ERCP);  Surgeon: Midge Minium, MD;  Location: [facility];  Service: Endoscopy;  Laterality: N/A;   FOOT SURGERY     x 2   INCISIONAL HERNIA REPAIR N/A 12/04/2020   Procedure: VENTRAL INCISIONAL HERNIA REPAIR WITH MESH;  Surgeon: Darnell Level, MD;  Location: [facility];  Service: General;  Laterality: N/A;   MELANOMA EXCISION Left 2000   PALATOPLASTY N/A 04/08/2015   Procedure: PALATOPLASTY;  Surgeon: Linus Salmons, MD;  Location: [facility];  Service: ENT;  Laterality: N/A;   TONSILLECTOMY N/A 04/08/2015   Procedure: TONSILLECTOMY;  Surgeon: Linus Salmons, MD;  Location: [facility];  Service: ENT;  Laterality: N/A;   TUBAL LIGATION     Patient Active Problem List   Diagnosis Date Noted   Chronic pain of right ankle 11/23/2021   Overweight (BMI 25.0-29.9) 11/23/2021   Paroxysmal atrial fibrillation ([place]) 10/09/2021   Aortic atherosclerosis ([place]) 05/12/2021   History of COVID-19 05/12/2021   Positive colorectal cancer screening using Cologuard test 02/05/2020   Esophageal dysphagia 12/11/2019   Hyperlipidemia LDL goal <100 12/10/2019   Right shoulder pain 10/17/2019   Arthritis of right acromioclavicular joint 08/15/2019   Piriformis syndrome of left side  08/15/2019   OSA (obstructive sleep apnea) 05/13/2019   CKD (chronic kidney disease) stage 3, GFR 30-59 ml/min (HCC) 04/13/2018   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   S/P laparoscopic cholecystectomy 07/19/2017   Hospital discharge follow-up 07/19/2017   Acoustic neuroma ([place]) 06/12/2017   Colon cancer screening 03/08/2017   Nocturia 12/09/2016   Major depressive disorder, 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[place]) 06/05/2016   Encounter for preventive health examination 03/07/2015   Incisional hernia, without obstruction or gangrene 09/28/2014   Anxiety state 11/19/2013   Gastro-esophageal reflux disease without esophagitis 11/19/2013   Squamous cell carcinoma of skin of face 10/21/2011   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 She reports she saw Dr. Sherryle Lis today. Pt says her ankle is healing well and she was told she is OK to where whatever footwear she wants. Pt reports no pain currently.  Pt felt dizzy early this morning when she woke up early, got up and went to turn around, stumbled and reached out to touch something to steady self.  PAIN:  Are you having pain? No   PERTINENT HISTORY: Pt is here for vestibular PT evaluation regarding difficulties with balance and "unable to walk straight". Pt reports having been diagnosed with an acoustic neuroma in L ear about 6 yrs ago secondary to having balance issues. Went to physician and was sent to PT under the assumption the  inner ear was the cause of the balance issues. Pt states that balance did not get any better and went back to physician and had imaging done. Per pt, scan found the neuroma and plan was to monitor it. Gets imaging each year as a result and has show no changes as of now. Has been sent to an audiologist during this time and was given hearing aids. Pt recently went to PT for ankle s/p surgery for collapsed arch in January, reports no restrictions regarding ankle for therapy and feels PT helped tremendously. During surgery, pt reports she went into A-fib and is now seeing cardiologist. Was placed on several medications, and was having episodes of dizziness and hallucinations as side effects from one of the medications. Went back to her physician and dosages have been adjusted and reports no side effects since. Pt attributes all of these events to her balance being "off". Reports that sudden head movements can bring on the "off balance" sensation, as well as bending down. Pt reports she  will get the feeling "something is off" and will notice she has veered to one side or the other. PMH is significant for: acoustic neuroma, arthritis, hx of other cancer, chronic kidney disease, anxiety, depression and HTN. See chart for additional information.     PRECAUTIONS: Fall  WEIGHT BEARING RESTRICTIONS No  FALLS: Has patient fallen in last 6 months? Yes. Number of falls 1; in January fell on way to bathroom following ankle surgery, got off balance and cut head open on corner of chest, did not go to the ER, talked to PCP about head injury and reports PCP had no concerns.  LIVING ENVIRONMENT: deferred to next session secondary to limited time Lives with: lives with their family Lives in: Other town house Stairs:    Has following equipment at home:     PLOF: [place]: "be able to walk straight"  OBJECTIVE:  Objective data taken at eval unless otherwise specified   DIAGNOSTIC FINDINGS: Taken  from 11/27/2021 MR BRAIN/IAC W WO CONTRAST: "IMPRESSION: 1. 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 2. Moderate chronic microvascular ischemic changes of the white matter in mild parenchymal volume loss, stable to mildly progressed since prior MRI."  COGNITION: Overall cognitive status: Within functional limits for tasks assessed    POSTURE: rounded shoulders and forward head   GAIT: Assistive device utilized: None Level of assistance: Complete Independence Comments: pt reports path deviations, unable to formally assess, plan to assess further next sessions, difficulty maintaining postural stability with scanning/head turns per pt report  PATIENT SURVEYS:  ABC scale 64% DHI 48: indicates moderate handicap FOTO 53 (goal score 59)   VESTIBULAR ASSESSMENT     SYMPTOM BEHAVIOR: Subjective history: complaints of imbalance, difficulty with turning head. Has acoustic neuroma (see subjective history above for details).   Non-Vestibular symptoms:  wears hearing aids B   Type of dizziness: Imbalance (Disequilibrium) and Unsteady with head/body turns   Frequency: with positional changes    Duration: unclear, will clarify future session   Aggravating factors: Induced by motion: occur when walking, bending down to the ground, turning body quickly, and turning head quickly   Relieving factors:  not reported   Progression of symptoms: unchanged   OCULOMOTOR EXAM:   Ocular Alignment: normal   Ocular ROM: No Limitations   Spontaneous Nystagmus: absent   Gaze-Induced Nystagmus: absent   Smooth Pursuits:  occasional saccadic intrusions   Saccades: hypometric/undershoots   Convergence/Divergence: WNL   VESTIBULAR - OCULAR REFLEX:    Slow VOR: Comment: noted corrective saccades with guided cervical rotation/VOR   VOR Cancellation: Comment: not tested   Head-Impulse Test: HIT Left: possible corrective saccade observed, however, pt with increased cervical stiffness B, had  difficulty performing test. Attempted modified version/self-performed head impulse test, but not able to achieve sufficient speed.    Dynamic Visual Acuity Test: Static: line 10, normal with no head movement Dynamic: line 6, impaired    VESTIBULAR TREATMENT: Today's treatment  Gait belt donned and CGA provided unless otherwise noted  NMR: Amb. Over 10 meters with sudden stopping and turning 180 deg. X multiple reps. No unsteadiness  Tandem gait 6x. Use of step-strategy, rates hard. PT provides CGA  Amb. Over 10 meters with horizontal head turns 4x. No significant deviation noted  On airex: at support surface EO with head turns (vert and horiz) x multiple reps for each. No LOB EC 4x30 sec improves with reps, initially unsteady and requires intermittent UE and up to mod assist Alt LE  march 2x20. Quick steps indicating decreased eccentric control/SLB  SLB, firm surface 45-60 sec each LE  VORx1, performed seated and standing with busy background (one background more challenging than the other) 2x30 sec for vert and horiz in each condition  Opt kinetic stimulation videos: setaed Vertical bars moving to R 60 sec - reports maybe feeling a little dizziness Horizontal bars moving up and down approx 30 sec of each - reports lines look "jumpy" but not dizziness  Tandem gait 4x 10 meters. Fewer errors compared to earlier in session  PATIENT EDUCATION: Education details: Pt educated throughout session about proper posture and technique with exercises. Improved exercise technique, movement at target joints, use of target muscles after min to mod verbal, visual, tactile cues.   Person educated: Patient Education method: Explanation, Demonstration, and Verbal cues Education comprehension: verbalized understanding, returned demonstration, verbal cues required, and needs further education   GOALS: Goals reviewed with patient? Yes  SHORT TERM GOALS: Target date: 04/07/2022   Pt will be  independent in home exercise program to improve strength/mobility for better functional independence with ADLs. Baseline: 7/18: initiate next visit; 8/9: initiated VORx1 seated; 9/8: HEP further advanced Goal status: IN PROGRESS   LONG TERM GOALS: Target date: 05/19/2022    Pt will increase FOTO score to at least 59 to demonstrate significant improvement in mobility and quality of life. Baseline: 7/18: 53, 9/8: 59 Goal status: MET  2.  Pt will reduce dizziness handicap inventory score to < 35, for less dizziness with ADLs and increased safety with home and community tasks. Baseline: 7/18: 48; 9/8: 24 Goal status: MET  3.  Pt will increase ABC scale score > 80% to demonstrate better functional mobility and better confidence with ADLs. Baseline: 7/18: 64%; 9/8: 71.88% Goal status: IN PROGRESS  4.  Pt will increase DGI score to at least 23/24 as to reduce fall risk and improve dynamic gait safety with community ambulation.  Baseline: 7/18: deferred to next session; 8/9: revised from FGA to DGI. Pt scored 21/24 on this date; 9/8: 22/24 Goal status:Partially MET  HOME EXERCISE PROGRAM:   Updated 9/8: Access Code: [email or web address] URL: [email or web address] Date: 02/05/2022 Prepared by: Ricard Dillon  Exercises - Corner Balance Feet Apart: Eyes Open With Head Turns  - 1 x daily - 7 x weekly - 2 sets - 15 reps - Shoulder External Rotation and Scapular Retraction with Resistance  - 1 x daily - 5 x weekly - 3 sets - 10 reps  Previous: 8/25: reinforced importance of performing all of HEP, specifically VOR intervention  Added 8/18: Access Code: [email or web address] URL: [email or web address] Date: 01/15/2022 Prepared by: Ricard Dillon  Exercises - Supine Chin Tuck  - 1 x daily - 5 x weekly - 2 sets - 10 reps - 3 seconds hold  01/06/22: Seated VORx1 vertical and horizontal head turns 3 minutes total of each performed in up to 60 second bouts. Instructed to rest if dizziness reaches  >2/10.   ASSESSMENT:  CLINICAL IMPRESSION: Continued emphasis on turning, pivoting and movements requiring horizontal head turns. Pt exhibits no unsteadiness today with quick turn/pivot intervention. She still deviates from straight path and uses step-strategy when ambulating with horiz head turns. Tandem gait remains very challenging for pt as well but she shows within-session improvement between initial practice and practice prior to end of session. Next session to focus on stair negotiation as pt reports she is fearful of this. The pt will benefit from further skilled PT to improve balance, dizziness, pain  and mobility.    OBJECTIVE IMPAIRMENTS Abnormal gait, decreased activity tolerance, decreased balance, decreased coordination, decreased endurance, decreased knowledge of use of DME, decreased mobility, difficulty walking, decreased ROM, dizziness, hypomobility, impaired vision/preception, improper body mechanics, and postural dysfunction.   ACTIVITY LIMITATIONS bending, sitting, standing, squatting, stairs, transfers, dressing, locomotion level, and caring for others  PARTICIPATION LIMITATIONS: cleaning, laundry, interpersonal relationship, shopping, community activity, and yard work  PERSONAL FACTORS Age, Past/current experiences, Sex, Time since onset of injury/illness/exacerbation, and 3+ comorbidities: acoustic neuroma, arthritis, chronic kidney disease, anxiety, depression, hx of other cancer, and HTN  are also affecting patient's functional outcome.   REHAB POTENTIAL: Good  CLINICAL DECISION MAKING: Evolving/moderate complexity  EVALUATION COMPLEXITY: Moderate   PLAN: PT FREQUENCY: 2x/week  PT DURATION: 12 weeks  PLANNED INTERVENTIONS: Therapeutic exercises, Therapeutic activity, Neuromuscular re-education, Balance training, Gait training, Patient/Family education, Self Care, Joint mobilization, Stair training, Vestibular training, Canalith repositioning, Visual/preceptual  remediation/compensation, DME instructions, Dry Needling, Electrical stimulation, Spinal mobilization, Cryotherapy, Moist heat, Compression bandaging, Taping, Manual therapy, and Re-evaluation  PLAN FOR NEXT SESSION: strength, balance, manual therapy, VOR, stairs, continue plan  Ricard Dillon PT, DPT  02/24/2022, 4:57 PM

## 2022-02-25 DIAGNOSIS — R351 Nocturia: Secondary | ICD-10-CM | POA: Diagnosis not present

## 2022-02-25 DIAGNOSIS — R350 Frequency of micturition: Secondary | ICD-10-CM | POA: Diagnosis not present

## 2022-02-26 ENCOUNTER — Encounter: Payer: Self-pay | Admitting: Internal Medicine

## 2022-02-26 ENCOUNTER — Ambulatory Visit: Payer: Medicare PPO

## 2022-02-26 ENCOUNTER — Telehealth: Payer: Self-pay | Admitting: Internal Medicine

## 2022-02-26 DIAGNOSIS — R2681 Unsteadiness on feet: Secondary | ICD-10-CM

## 2022-02-26 DIAGNOSIS — R262 Difficulty in walking, not elsewhere classified: Secondary | ICD-10-CM | POA: Diagnosis not present

## 2022-02-26 DIAGNOSIS — R278 Other lack of coordination: Secondary | ICD-10-CM | POA: Diagnosis not present

## 2022-02-26 DIAGNOSIS — R42 Dizziness and giddiness: Secondary | ICD-10-CM

## 2022-02-26 DIAGNOSIS — M6281 Muscle weakness (generalized): Secondary | ICD-10-CM | POA: Diagnosis not present

## 2022-02-26 DIAGNOSIS — G4733 Obstructive sleep apnea (adult) (pediatric): Secondary | ICD-10-CM

## 2022-02-26 DIAGNOSIS — R350 Frequency of micturition: Secondary | ICD-10-CM | POA: Insufficient documentation

## 2022-02-26 DIAGNOSIS — M542 Cervicalgia: Secondary | ICD-10-CM | POA: Diagnosis not present

## 2022-02-26 DIAGNOSIS — M25571 Pain in right ankle and joints of right foot: Secondary | ICD-10-CM | POA: Diagnosis not present

## 2022-02-26 HISTORY — DX: Frequency of micturition: R35.0

## 2022-02-26 NOTE — Progress Notes (Signed)
  Subjective:  Patient ID: Kathy Long, female    DOB: December 08, 1950,  MRN: [id number]  Chief Complaint  Patient presents with   Routine Post Op    POV  DOS 06/05/2020 FUSION OF SUBTARLAR & TALONAVICULAR JOINTS, BONE GRAFT FROM LEG  RT FOOT, PT AND FDL TENODESIS      71 y.o. female returns for post-op check.  Says she is doing much better she is not having much pain.  She has been using a bone stimulator  Review of Systems: Negative except as noted in the HPI. Denies N/V/F/Ch.   Objective:  There were no vitals filed for this visit. There is no height or weight on file to calculate BMI. Constitutional Well developed. Well nourished.  Vascular Foot warm and well perfused. Capillary refill normal to all digits.  Calf is soft and supple, no posterior calf or knee pain, negative Homans' sign  Neurologic Normal speech. Oriented to person, place, and time. Epicritic sensation to light touch grossly present bilaterally.  Dermatologic Incisions are well-healed not hypertrophic  Orthopedic: No pain.  No notable edema.  Reducible hammertoe contracture   Multiple view plain film radiographs: New films taken today show maintained correction and good consolidation across the posterior facet of the subtalar joint, decrease in the radiolucency around the talonavicular joint  IMPRESSION: 1. Posterior subtalar arthrodesis transfixed with 2 cannulated screws and osseous bridging across the joint space. 2. Talonavicular arthrodesis transfixed with a medial and lateral stable and areas of bone bridging. Small area of persistent joint space without bone bridging along the dorsal aspect. 3. Mild osteoarthritis of the calcaneocuboid joint. 4. 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 5. Subchondral cystic changes along the medial corner of the talar dome likely reflecting an osteochondral lesion.     Electronically Signed   By: Kathreen Devoid M.D.   On: 12/06/2021 07:12 Assessment:    1. Secondary osteoarthritis of talonavicular joint     Plan:  Patient was evaluated and treated and all questions answered.  S/p foot surgery right -Overall doing very well.  We reviewed her CT scan and her new radiographs today.  She should continue using her bone stimulator through the end of the year.  She does have a reducible hammertoe contracture deformity.  We discussed flexor tenotomy of this.  We will plan for this around the 1 year mark from her major foot surgery.  At that point we will take her 1 year follow-up x-rays as well.  She will continue PT as needed.  Return in about 15 weeks (around 06/09/2022) for surgery follow up (new x-rays); 2nd toe tenotomy.

## 2022-02-26 NOTE — Therapy (Signed)
OUTPATIENT PHYSICAL THERAPY VESTIBULAR TREATMENT     Patient Name: Kathy Long MRN: [id number] DOB:28-Mar-1951, 71 y.o., female Today's Date: 02/26/2022  PCP: [name], MD REFERRING PROVIDER: [name], MD  PT End of Session - 02/26/22 [id number]     Visit Number 14    Number of Visits 25    Date for PT Re-Evaluation 03/09/22    PT Start Time 0842    PT Stop Time 0925    PT Time Calculation (min) 43 min    Equipment Utilized During Treatment Gait belt    Activity Tolerance Patient tolerated treatment well    Behavior During Therapy WFL for tasks assessed/performed                         Past Medical History:  Diagnosis Date   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   Anxiety    Arthritis    lower back, hands   Cancer ([place])    melanoma, shoulder   Chronic kidney disease    Closed fracture of distal lateral malleolus of left ankle 12/28/2017   Depression    GERD (gastroesophageal reflux disease)    Hyperlipidemia    Hypertension    Postoperative bile leak    Sleep apnea    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    Past Surgical History:  Procedure Laterality Date   ANKLE FRACTURE SURGERY Left    APPENDECTOMY  2010   BACK SURGERY     disectomy lumbar, scar tissue   BREAST CYST EXCISION Right 1970   axilla   buninectomy Bilateral    CATARACT EXTRACTION W/PHACO Left 04/12/2018   Procedure: CATARACT EXTRACTION PHACO AND INTRAOCULAR LENS PLACEMENT ([place])  LEFT TORIC LENS;  Surgeon: Leandrew Koyanagi, MD;  Location: [facility];  Service: Ophthalmology;  Laterality: Left;   CATARACT EXTRACTION W/PHACO Right 05/10/2018   Procedure: CATARACT EXTRACTION PHACO AND INTRAOCULAR LENS PLACEMENT ([place])  RIGHT TORIC LENS;  Surgeon: Leandrew Koyanagi, MD;  Location: [facility];  Service: Ophthalmology;  Laterality: Right;  DIABETIC   CHOLECYSTECTOMY N/A 07/05/2017   Procedure: LAPAROSCOPIC CHOLECYSTECTOMY WITH INTRAOPERATIVE CHOLANGIOGRAM;   Surgeon: Robert Bellow, MD;  Location: [facility];  Service: General;  Laterality: N/A;   COLONOSCOPY     2009 and color gaurd in 2018   COLONOSCOPY N/A 03/25/2020   Procedure: COLONOSCOPY;  Surgeon: [name], MD;  Location: [facility]COPY;  Service: Endoscopy;  Laterality: N/A;   DRUG INDUCED ENDOSCOPY N/A 05/06/2021   Procedure: DRUG INDUCED SLEEP ENDOSCOPY;  Surgeon: Melida Quitter, MD;  Location: [facility];  Service: ENT;  Laterality: N/A;   ERCP N/A 07/12/2017   Procedure: ENDOSCOPIC RETROGRADE CHOLANGIOPANCREATOGRAPHY (ERCP);  Surgeon: Lucilla Lame, MD;  Location: [facility] ENDOSCOPY;  Service: Endoscopy;  Laterality: N/A;   ERCP N/A 10/04/2017   Procedure: ENDOSCOPIC RETROGRADE CHOLANGIOPANCREATOGRAPHY (ERCP);  Surgeon: Lucilla Lame, MD;  [facility];  Service: Endoscopy;  Laterality: N/A;   FOOT SURGERY     x 2   INCISIONAL HERNIA REPAIR N/A 12/04/2020   Procedure: VENTRAL INCISIONAL HERNIA REPAIR WITH MESH;  Surgeon: Armandina Gemma, MD;  Location: [facility];  Service: General;  Laterality: N/A;   MELANOMA EXCISION Left 2000   PALATOPLASTY N/A 04/08/2015   Procedure: PALATOPLASTY;  Surgeon: Beverly Gust, MD;  Location: [facility];  Service: ENT;  Laterality: N/A;   TONSILLECTOMY N/A 04/08/2015   Procedure: TONSILLECTOMY;  Surgeon: Beverly Gust, MD;  Location: [facility];  Service: ENT;  Laterality: N/A;   TUBAL LIGATION     Patient Active Problem List   Diagnosis Date Noted   Chronic pain of right ankle 11/23/2021   Overweight (BMI 25.0-29.9) 11/23/2021   Paroxysmal atrial fibrillation ([place]) 10/09/2021   Aortic atherosclerosis ([place]) 05/12/2021   History of COVID-19 05/12/2021   Positive colorectal cancer screening using Cologuard test 02/05/2020   Esophageal dysphagia 12/11/2019   Hyperlipidemia LDL goal <100 12/10/2019   Right shoulder pain 10/17/2019   Arthritis of right acromioclavicular joint 08/15/2019   Piriformis syndrome of left side  08/15/2019   OSA (obstructive sleep apnea) 05/13/2019   CKD (chronic kidney disease) stage 3, GFR 30-59 ml/min (HCC) 04/13/2018   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   S/P laparoscopic cholecystectomy 07/19/2017   Hospital discharge follow-up 07/19/2017   Acoustic neuroma ([place]) 06/12/2017   Colon cancer screening 03/08/2017   Nocturia 12/09/2016   Major depressive disorder, 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[place]) 06/05/2016   Encounter for preventive health examination 03/07/2015   Incisional hernia, without obstruction or gangrene 09/28/2014   Anxiety state 11/19/2013   Gastro-esophageal reflux disease without esophagitis 11/19/2013   Squamous cell carcinoma of skin of face 10/21/2011   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 Pt reports they think it is sleep apnea. She met with friends and hung out with her Grandson. She says it was a nice day, but very busy.  She reports she did feel unsteady off and on. She noticed it this morning as well.  PAIN:  Are you having pain? Yes, some mild pain in ankle   PERTINENT HISTORY: Pt is here for vestibular PT evaluation regarding difficulties with balance and "unable to walk straight". Pt reports having been diagnosed with an acoustic neuroma in L ear about 6 yrs ago secondary to having balance issues. Went to physician and was sent to PT under the assumption the inner ear was the cause of the balance issues. Pt states that balance did not get any better and went back to physician and  had imaging done. Per pt, scan found the neuroma and plan was to monitor it. Gets imaging each year as a result and has show no changes as of now. Has been sent to an audiologist during this time and was given hearing aids. Pt recently went to PT for ankle s/p surgery for collapsed arch in January, reports no restrictions regarding ankle for therapy and feels PT helped tremendously. During surgery, pt reports she went into A-fib and is now seeing cardiologist. Was placed on several medications, and was having episodes of dizziness and hallucinations as side effects from one of the medications. Went back to her physician and dosages have been adjusted and reports no side effects since. Pt attributes all of these events to her balance being "off". Reports that sudden head movements can bring on the "off balance" sensation, as well as bending down. Pt reports she will get the feeling "something is off" and will notice she has veered to one side or the  other. PMH is significant for: acoustic neuroma, arthritis, hx of other cancer, chronic kidney disease, anxiety, depression and HTN. See chart for additional information.     PRECAUTIONS: Fall  WEIGHT BEARING RESTRICTIONS No  FALLS: Has patient fallen in last 6 months? Yes. Number of falls 1; in January fell on way to bathroom following ankle surgery, got off balance and cut head open on corner of chest, did not go to the ER, talked to PCP about head injury and reports PCP had no concerns.  LIVING ENVIRONMENT: deferred to next session secondary to limited time Lives with: lives with their family Lives in: Other town house Stairs:    Has following equipment at home:     PLOF: [place]: "be able to walk straight"  OBJECTIVE:  Objective data taken at eval unless otherwise specified   DIAGNOSTIC FINDINGS: Taken from 11/27/2021 MR BRAIN/IAC W WO CONTRAST: "IMPRESSION: 1. 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 2.  Moderate chronic microvascular ischemic changes of the white matter in mild parenchymal volume loss, stable to mildly progressed since prior MRI."  COGNITION: Overall cognitive status: Within functional limits for tasks assessed    POSTURE: rounded shoulders and forward head   GAIT: Assistive device utilized: None Level of assistance: Complete Independence Comments: pt reports path deviations, unable to formally assess, plan to assess further next sessions, difficulty maintaining postural stability with scanning/head turns per pt report  PATIENT SURVEYS:  ABC scale 64% DHI 48: indicates moderate handicap FOTO 53 (goal score 59)   VESTIBULAR ASSESSMENT     SYMPTOM BEHAVIOR: Subjective history: complaints of imbalance, difficulty with turning head. Has acoustic neuroma (see subjective history above for details).   Non-Vestibular symptoms:  wears hearing aids B   Type of dizziness: Imbalance (Disequilibrium) and Unsteady with head/body turns   Frequency: with positional changes    Duration: unclear, will clarify future session   Aggravating factors: Induced by motion: occur when walking, bending down to the ground, turning body quickly, and turning head quickly   Relieving factors:  not reported   Progression of symptoms: unchanged   OCULOMOTOR EXAM:   Ocular Alignment: normal   Ocular ROM: No Limitations   Spontaneous Nystagmus: absent   Gaze-Induced Nystagmus: absent   Smooth Pursuits:  occasional saccadic intrusions   Saccades: hypometric/undershoots   Convergence/Divergence: WNL   VESTIBULAR - OCULAR REFLEX:    Slow VOR: Comment: noted corrective saccades with guided cervical rotation/VOR   VOR Cancellation: Comment: not tested   Head-Impulse Test: HIT Left: possible corrective saccade observed, however, pt with increased cervical stiffness B, had difficulty performing test. Attempted modified version/self-performed head impulse test, but not able to achieve sufficient  speed.    Dynamic Visual Acuity Test: Static: line 10, normal with no head movement Dynamic: line 6, impaired    VESTIBULAR TREATMENT: Today's treatment  Gait belt donned and CGA provided unless otherwise noted  NMR:  Stair training: ascending/descending 4 steps x 4 rounds. Able to complete without UE support, and exhibits recip pattern  Sustained heel raises 2x30 sec; intermittent UE support  Sustained DF 2x30 sec sec each LE; intermittent UE support, rates difficult  SLB 2x30 sec each; intermittent UE support  Eccentric step-downs from 6" box x multiple reps each LE. UUE support, fatiguing  Cone taps each LE x multiple reps. Cuing throughout for technique/decrease speed, pt knocks over cones.  Amb over 10 meters for dynamic balance: 2-4 rounds of each - with horizontal head turns - with  vertical head turns -Tandem gait -High knee march Comments: mild instability with all interventions. Pt has most difficulty with high knee march  Semi tandem on airex 2x30 sec each LE  Amb. Over 10 meters with sudden stopping and turning 180 deg. X multiple reps. No unsteadiness today.  Amb in long hallway reading sticky notes to incorporate vert and horiz head turns with dual task - 2x. Mild unsteadiness  TherEx Standing heel raises 2x20 Standing DF 2x20    PATIENT EDUCATION: Education details: Pt educated throughout session about proper posture and technique with exercises. Improved exercise technique, movement at target joints, use of target muscles after min to mod verbal, visual, tactile cues.   Person educated: Patient Education method: Explanation, Demonstration, and Verbal cues Education comprehension: verbalized understanding, returned demonstration, verbal cues required, and needs further education   GOALS: Goals reviewed with patient? Yes  SHORT TERM GOALS: Target date: 04/09/2022   Pt will be independent in home exercise program to improve strength/mobility for  better functional independence with ADLs. Baseline: 7/18: initiate next visit; 8/9: initiated VORx1 seated; 9/8: HEP further advanced Goal status: IN PROGRESS   LONG TERM GOALS: Target date: 05/21/2022    Pt will increase FOTO score to at least 59 to demonstrate significant improvement in mobility and quality of life. Baseline: 7/18: 53, 9/8: 59 Goal status: MET  2.  Pt will reduce dizziness handicap inventory score to < 35, for less dizziness with ADLs and increased safety with home and community tasks. Baseline: 7/18: 48; 9/8: 24 Goal status: MET  3.  Pt will increase ABC scale score > 80% to demonstrate better functional mobility and better confidence with ADLs. Baseline: 7/18: 64%; 9/8: 71.88% Goal status: IN PROGRESS  4.  Pt will increase DGI score to at least 23/24 as to reduce fall risk and improve dynamic gait safety with community ambulation.  Baseline: 7/18: deferred to next session; 8/9: revised from FGA to DGI. Pt scored 21/24 on this date; 9/8: 22/24 Goal status:Partially MET  HOME EXERCISE PROGRAM: no updates today, pt to continue HEP as previously given  Updated 9/8: Access Code: [email or web address] URL: [email or web address] Date: 02/05/2022 Prepared by: Ricard Dillon  Exercises - Corner Balance Feet Apart: Eyes Open With Head Turns  - 1 x daily - 7 x weekly - 2 sets - 15 reps - Shoulder External Rotation and Scapular Retraction with Resistance  - 1 x daily - 5 x weekly - 3 sets - 10 reps  Previous: 8/25: reinforced importance of performing all of HEP, specifically VOR intervention  Added 8/18: Access Code: [email or web address] URL: [email or web address] Date: 01/15/2022 Prepared by: Ricard Dillon  Exercises - Supine Chin Tuck  - 1 x daily - 5 x weekly - 2 sets - 10 reps - 3 seconds hold  01/06/22: Seated VORx1 vertical and horizontal head turns 3 minutes total of each performed in up to 60 second bouts. Instructed to rest if dizziness reaches  >2/10.   ASSESSMENT:  CLINICAL IMPRESSION: Pt able to complete multiple rounds of ascending/descending steps without UE support and with recip pattern. She completed this without instability, but did exhibit some hesitance with descending stairs. While she shows progress, pt unsteady with all SLB interventions and progressions. Will continue to focus on this deficit future sessions.  The pt will benefit from further skilled PT to improve balance, dizziness, pain and mobility.    OBJECTIVE IMPAIRMENTS Abnormal gait, decreased activity tolerance, decreased balance, decreased coordination, decreased endurance, decreased knowledge of use of DME, decreased  mobility, difficulty walking, decreased ROM, dizziness, hypomobility, impaired vision/preception, improper body mechanics, and postural dysfunction.   ACTIVITY LIMITATIONS bending, sitting, standing, squatting, stairs, transfers, dressing, locomotion level, and caring for others  PARTICIPATION LIMITATIONS: cleaning, laundry, interpersonal relationship, shopping, community activity, and yard work  PERSONAL FACTORS Age, Past/current experiences, Sex, Time since onset of injury/illness/exacerbation, and 3+ comorbidities: acoustic neuroma, arthritis, chronic kidney disease, anxiety, depression, hx of other cancer, and HTN  are also affecting patient's functional outcome.   REHAB POTENTIAL: Good  CLINICAL DECISION MAKING: Evolving/moderate complexity  EVALUATION COMPLEXITY: Moderate   PLAN: PT FREQUENCY: 2x/week  PT DURATION: 12 weeks  PLANNED INTERVENTIONS: Therapeutic exercises, Therapeutic activity, Neuromuscular re-education, Balance training, Gait training, Patient/Family education, Self Care, Joint mobilization, Stair training, Vestibular training, Canalith repositioning, Visual/preceptual remediation/compensation, DME instructions, Dry Needling, Electrical stimulation, Spinal mobilization, Cryotherapy, Moist heat, Compression bandaging,  Taping, Manual therapy, and Re-evaluation  PLAN FOR NEXT SESSION: strength, balance, manual therapy, VOR, stairs, SLB continue plan  Ricard Dillon PT, DPT  02/26/2022, 11:17 AM

## 2022-02-26 NOTE — Telephone Encounter (Signed)
Tammy, please advise if okay to order HST? Thanks

## 2022-02-26 NOTE — Telephone Encounter (Signed)
HST ordered. Patient is aware and voiced her understanding.  Nothing further needed.

## 2022-02-26 NOTE — Telephone Encounter (Signed)
Yes please order home sleep study

## 2022-02-26 NOTE — Telephone Encounter (Signed)
Patient spoke with Rexene Edison, NP at her last OV 02/15/2022 about HST that Dr. Derrel Nip was going to order. Patient is unable to schedule with them/ cannot get them on the phone and would like Rexene Edison, NP to order HST.   Please advise.

## 2022-03-01 ENCOUNTER — Other Ambulatory Visit: Payer: Self-pay | Admitting: Internal Medicine

## 2022-03-02 NOTE — Telephone Encounter (Signed)
FYI

## 2022-03-03 ENCOUNTER — Ambulatory Visit: Payer: Medicare PPO | Attending: Internal Medicine

## 2022-03-03 DIAGNOSIS — M6281 Muscle weakness (generalized): Secondary | ICD-10-CM | POA: Diagnosis not present

## 2022-03-03 DIAGNOSIS — R2681 Unsteadiness on feet: Secondary | ICD-10-CM | POA: Insufficient documentation

## 2022-03-03 DIAGNOSIS — R262 Difficulty in walking, not elsewhere classified: Secondary | ICD-10-CM | POA: Insufficient documentation

## 2022-03-03 DIAGNOSIS — R42 Dizziness and giddiness: Secondary | ICD-10-CM | POA: Insufficient documentation

## 2022-03-03 DIAGNOSIS — M542 Cervicalgia: Secondary | ICD-10-CM | POA: Diagnosis not present

## 2022-03-03 NOTE — Therapy (Unsigned)
OUTPATIENT PHYSICAL THERAPY VESTIBULAR TREATMENT     Patient Name: Kathy Long MRN: [id number] DOB:01/08/1951, 71 y.o., female Today's Date: 03/04/2022  PCP: [name], MD REFERRING PROVIDER: [name], MD  PT End of Session - 03/04/22 1405     Visit Number 15    Number of Visits 25    Date for PT Re-Evaluation 03/09/22    PT Start Time 1602    PT Stop Time 1644    PT Time Calculation (min) 42 min    Equipment Utilized During Treatment Gait belt    Activity Tolerance Patient tolerated treatment well    Behavior During Therapy WFL for tasks assessed/performed                          Past Medical History:  Diagnosis Date   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   Anxiety    Arthritis    lower back, hands   Cancer ([place])    melanoma, shoulder   Chronic kidney disease    Closed fracture of distal lateral malleolus of left ankle 12/28/2017   Depression    GERD (gastroesophageal reflux disease)    Hyperlipidemia    Hypertension    Postoperative bile leak    Sleep apnea    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    Past Surgical History:  Procedure Laterality Date   ANKLE FRACTURE SURGERY Left    APPENDECTOMY  2010   BACK SURGERY     disectomy lumbar, scar tissue   BREAST CYST EXCISION Right 1970   axilla   buninectomy Bilateral    CATARACT EXTRACTION W/PHACO Left 04/12/2018   Procedure: CATARACT EXTRACTION PHACO AND INTRAOCULAR LENS PLACEMENT ([place])  LEFT TORIC LENS;  Surgeon: Leandrew Koyanagi, MD;  Location: [place];  Service: Ophthalmology;  Laterality: Left;   CATARACT EXTRACTION W/PHACO Right 05/10/2018   Procedure: CATARACT EXTRACTION PHACO AND INTRAOCULAR LENS PLACEMENT ([place])  RIGHT TORIC LENS;  Surgeon: Leandrew Koyanagi, MD;  Location: [place];  Service: Ophthalmology;  Laterality: Right;  DIABETIC   CHOLECYSTECTOMY N/A 07/05/2017   Procedure: LAPAROSCOPIC CHOLECYSTECTOMY WITH INTRAOPERATIVE CHOLANGIOGRAM;   Surgeon: Robert Bellow, MD;  Location: [facility];  Service: General;  Laterality: N/A;   COLONOSCOPY     2009 and color gaurd in 2018   COLONOSCOPY N/A 03/25/2020   Procedure: COLONOSCOPY;  Surgeon: [name], MD;  Location: [facility];  Service: Endoscopy;  Laterality: N/A;   DRUG INDUCED ENDOSCOPY N/A 05/06/2021   Procedure: DRUG INDUCED SLEEP ENDOSCOPY;  Surgeon: Melida Quitter, MD;  Location: [place];  Service: ENT;  Laterality: N/A;   ERCP N/A 07/12/2017   Procedure: ENDOSCOPIC RETROGRADE CHOLANGIOPANCREATOGRAPHY (ERCP);  Surgeon: Lucilla Lame, MD;  Location: [facility]COPY;  Service: Endoscopy;  Laterality: N/A;   ERCP N/A 10/04/2017   Procedure: ENDOSCOPIC RETROGRADE CHOLANGIOPANCREATOGRAPHY (ERCP);  Surgeon: Lucilla Lame, MD;  Location: [facility] ENDOSCOPY;  Service: Endoscopy;  Laterality: N/A;   FOOT SURGERY     x 2   INCISIONAL HERNIA REPAIR N/A 12/04/2020   Procedure: VENTRAL INCISIONAL HERNIA REPAIR WITH MESH;  Surgeon: Armandina Gemma, MD;  Location: [facility];  Service: General;  Laterality: N/A;   MELANOMA EXCISION Left 2000   PALATOPLASTY N/A 04/08/2015   Procedure: PALATOPLASTY;  Surgeon: Beverly Gust, MD;  Location: [facility];  Service: ENT;  Laterality: N/A;   TONSILLECTOMY N/A 04/08/2015   Procedure: TONSILLECTOMY;  Surgeon: Beverly Gust, MD;  Location: [facility];  Service: ENT;  Laterality: N/A;   TUBAL LIGATION     Patient Active Problem List   Diagnosis Date Noted   Chronic pain of right ankle 11/23/2021   Overweight (BMI 25.0-29.9) 11/23/2021   Paroxysmal atrial fibrillation ([place]) 10/09/2021   Aortic atherosclerosis ([place]) 05/12/2021   History of COVID-19 05/12/2021   Positive colorectal cancer screening using Cologuard test 02/05/2020   Esophageal dysphagia 12/11/2019   Hyperlipidemia LDL goal <100 12/10/2019   Right shoulder pain 10/17/2019   Arthritis of right acromioclavicular joint 08/15/2019   Piriformis syndrome of left side  08/15/2019   OSA (obstructive sleep apnea) 05/13/2019   CKD (chronic kidney disease) stage 3, GFR 30-59 ml/min ([facility]) 04/13/2018   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   S/P laparoscopic cholecystectomy 07/19/2017   Hospital discharge follow-up 07/19/2017   Acoustic neuroma ([place]) 06/12/2017   Colon cancer screening 03/08/2017   Nocturia 12/09/2016   Major depressive disorder, 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[place]) 06/05/2016   Encounter for preventive health examination 03/07/2015   Incisional hernia, without obstruction or gangrene 09/28/2014   Anxiety state 11/19/2013   Gastro-esophageal reflux disease without esophagitis 11/19/2013   Squamous cell carcinoma of skin of face 10/21/2011   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 She is still experiencing ear-popping sensation.  PAIN:  Are you having pain? Yes, still has some neck pain   PERTINENT HISTORY: Pt is here for vestibular PT evaluation regarding difficulties with balance and "unable to walk straight". Pt reports having been diagnosed with an acoustic neuroma in L ear about 6 yrs ago secondary to having balance issues. Went to physician and was sent to PT under the assumption the inner ear was the cause of the balance issues. Pt states that balance did not get any better and went back to physician and had imaging done. Per pt, scan found the neuroma and plan was to monitor it. Gets imaging each year as a result and has show no changes as of  now. Has been sent to an audiologist during this time and was given hearing aids. Pt recently went to PT for ankle s/p surgery for collapsed arch in January, reports no restrictions regarding ankle for therapy and feels PT helped tremendously. During surgery, pt reports she went into A-fib and is now seeing cardiologist. Was placed on several medications, and was having episodes of dizziness and hallucinations as side effects from one of the medications. Went back to her physician and dosages have been adjusted and reports no side effects since. Pt attributes all of these events to her balance being "off". Reports that sudden head movements can bring on the "off balance" sensation, as well as bending down. Pt reports she will get the feeling "something is off" and will notice she has veered to one side or the other. PMH is significant for: acoustic neuroma, arthritis, hx of other cancer, chronic kidney disease, anxiety, depression and HTN. See chart for additional information.     PRECAUTIONS: Fall  WEIGHT BEARING RESTRICTIONS No  FALLS: Has patient fallen in  last 6 months? Yes. Number of falls 1; in January fell on way to bathroom following ankle surgery, got off balance and cut head open on corner of chest, did not go to the ER, talked to PCP about head injury and reports PCP had no concerns.  LIVING ENVIRONMENT: deferred to next session secondary to limited time Lives with: lives with their family Lives in: Other town house Stairs:    Has following equipment at home:     PLOF: [place]: "be able to walk straight"  OBJECTIVE:  Objective data taken at eval unless otherwise specified   DIAGNOSTIC FINDINGS: Taken from 11/27/2021 MR BRAIN/IAC W WO CONTRAST: "IMPRESSION: 1. 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 2. Moderate chronic microvascular ischemic changes of the white matter in mild parenchymal volume loss, stable to mildly progressed since prior  MRI."  COGNITION: Overall cognitive status: Within functional limits for tasks assessed    POSTURE: rounded shoulders and forward head   GAIT: Assistive device utilized: None Level of assistance: Complete Independence Comments: pt reports path deviations, unable to formally assess, plan to assess further next sessions, difficulty maintaining postural stability with scanning/head turns per pt report  PATIENT SURVEYS:  ABC scale 64% DHI 48: indicates moderate handicap FOTO 53 (goal score 59)   VESTIBULAR ASSESSMENT     SYMPTOM BEHAVIOR: Subjective history: complaints of imbalance, difficulty with turning head. Has acoustic neuroma (see subjective history above for details).   Non-Vestibular symptoms:  wears hearing aids B   Type of dizziness: Imbalance (Disequilibrium) and Unsteady with head/body turns   Frequency: with positional changes    Duration: unclear, will clarify future session   Aggravating factors: Induced by motion: occur when walking, bending down to the ground, turning body quickly, and turning head quickly   Relieving factors:  not reported   Progression of symptoms: unchanged   OCULOMOTOR EXAM:   Ocular Alignment: normal   Ocular ROM: No Limitations   Spontaneous Nystagmus: absent   Gaze-Induced Nystagmus: absent   Smooth Pursuits:  occasional saccadic intrusions   Saccades: hypometric/undershoots   Convergence/Divergence: WNL   VESTIBULAR - OCULAR REFLEX:    Slow VOR: Comment: noted corrective saccades with guided cervical rotation/VOR   VOR Cancellation: Comment: not tested   Head-Impulse Test: HIT Left: possible corrective saccade observed, however, pt with increased cervical stiffness B, had difficulty performing test. Attempted modified version/self-performed head impulse test, but not able to achieve sufficient speed.    Dynamic Visual Acuity Test: Static: line 10, normal with no head movement Dynamic: line 6, impaired    VESTIBULAR TREATMENT:  Today's treatment  Gait belt donned and CGA provided unless otherwise noted  NMR:  Busy background, VORx1, vert and horiz head turns performed in 30 sec bouts in both NBOS and semi-tandem (each LE). Pt exhibits unsteadiness with horizontal head turns only  360 turns against wall  x multiple reps-increases dizziness, pt requires recovery intervals   Amb over 10 meters with horiz head-turns 4x through - mildly unsteady throughout  Instructed to increased VORx1 HEP to 5 min total only with horizontal head turns   PT and pt discussed treating with B Epley today as pt continues to exhibit decreased steadiness with head turns, despite no symptoms with testing. PT discussed with pt that some pts do not experience classic vertigo or exhibit nystagmus with testing, and that this can at times be due to medications or other factors. PT discussed reasoning for B treatment and that if a canal  is affected, research supports it is most likely to be the posterior canal, requiring an Epley maneuver. Pt verbalized understanding for all and is eager to try treatment to rule out this kind of peripheral deficit as contributing factor to symptoms.   Epley 1x to both R and L side. Pt did report some mild symptoms initially with treatment in first position, not described as vertigo, pt unsure how to describe.   PT discussed with pt post-maneuver precautions: take it easy for rest of day, do not try anything challenging to balance, sleep if possible in reclined position in supine, head supported on pillows. Pt verbalized understanding for all and demonstrated good stability with STS and gait following maneuvers.    PATIENT EDUCATION: Education details: Pt educated throughout session about proper posture and technique with exercises. Improved exercise technique, movement at target joints, use of target muscles after min to mod verbal, visual, tactile cues.   Person educated: Patient Education method: Explanation,  Demonstration, and Verbal cues Education comprehension: verbalized understanding, returned demonstration, verbal cues required, and needs further education   GOALS: Goals reviewed with patient? Yes  SHORT TERM GOALS: Target date: 04/15/2022   Pt will be independent in home exercise program to improve strength/mobility for better functional independence with ADLs. Baseline: 7/18: initiate next visit; 8/9: initiated VORx1 seated; 9/8: HEP further advanced Goal status: IN PROGRESS   LONG TERM GOALS: Target date: 05/27/2022    Pt will increase FOTO score to at least 59 to demonstrate significant improvement in mobility and quality of life. Baseline: 7/18: 53, 9/8: 59 Goal status: MET  2.  Pt will reduce dizziness handicap inventory score to < 35, for less dizziness with ADLs and increased safety with home and community tasks. Baseline: 7/18: 48; 9/8: 24 Goal status: MET  3.  Pt will increase ABC scale score > 80% to demonstrate better functional mobility and better confidence with ADLs. Baseline: 7/18: 64%; 9/8: 71.88% Goal status: IN PROGRESS  4.  Pt will increase DGI score to at least 23/24 as to reduce fall risk and improve dynamic gait safety with community ambulation.  Baseline: 7/18: deferred to next session; 8/9: revised from FGA to DGI. Pt scored 21/24 on this date; 9/8: 22/24 Goal status:Partially MET  HOME EXERCISE PROGRAM:   10/4: Instructed pt to increase total time VORx1 horizontal head turns to 5 minutes/day (performed in 30 sec to 1 min intervals), seated.   Updated 9/8: Access Code: [email or web address] URL: [email or web address] Date: 02/05/2022 Prepared by: Ricard Dillon  Exercises - Corner Balance Feet Apart: Eyes Open With Head Turns  - 1 x daily - 7 x weekly - 2 sets - 15 reps - Shoulder External Rotation and Scapular Retraction with Resistance  - 1 x daily - 5 x weekly - 3 sets - 10 reps  Previous: 8/25: reinforced importance of performing all of HEP,  specifically VOR intervention  Added 8/18: Access Code: [email or web address] URL: [email or web address] Date: 01/15/2022 Prepared by: Ricard Dillon  Exercises - Supine Chin Tuck  - 1 x daily - 5 x weekly - 2 sets - 10 reps - 3 seconds hold  01/06/22: Seated VORx1 vertical and horizontal head turns 3 minutes total of each performed in up to 60 second bouts. Instructed to rest if dizziness reaches >2/10.   ASSESSMENT:  CLINICAL IMPRESSION: Pt continues to exhibit greatest decrease in postural stability with horizontal head turns, feels she has had limited progress with this. Today, PT provided Epley 1x to R and 1x to L  side (see above for details, discussion), and followed up with education on post-maneuver precautions. Will continue to monitor pt's symptoms. The pt will benefit from further skilled PT to improve balance, dizziness, pain and mobility to decrease fall risk and increase QOL.   OBJECTIVE IMPAIRMENTS Abnormal gait, decreased activity tolerance, decreased balance, decreased coordination, decreased endurance, decreased knowledge of use of DME, decreased mobility, difficulty walking, decreased ROM, dizziness, hypomobility, impaired vision/preception, improper body mechanics, and postural dysfunction.   ACTIVITY LIMITATIONS bending, sitting, standing, squatting, stairs, transfers, dressing, locomotion level, and caring for others  PARTICIPATION LIMITATIONS: cleaning, laundry, interpersonal relationship, shopping, community activity, and yard work  PERSONAL FACTORS Age, Past/current experiences, Sex, Time since onset of injury/illness/exacerbation, and 3+ comorbidities: acoustic neuroma, arthritis, chronic kidney disease, anxiety, depression, hx of other cancer, and HTN  are also affecting patient's functional outcome.   REHAB POTENTIAL: Good  CLINICAL DECISION MAKING: Evolving/moderate complexity  EVALUATION COMPLEXITY: Moderate   PLAN: PT FREQUENCY: 2x/week  PT DURATION: 12  weeks  PLANNED INTERVENTIONS: Therapeutic exercises, Therapeutic activity, Neuromuscular re-education, Balance training, Gait training, Patient/Family education, Self Care, Joint mobilization, Stair training, Vestibular training, Canalith repositioning, Visual/preceptual remediation/compensation, DME instructions, Dry Needling, Electrical stimulation, Spinal mobilization, Cryotherapy, Moist heat, Compression bandaging, Taping, Manual therapy, and Re-evaluation  PLAN FOR NEXT SESSION: strength, balance, manual therapy, VOR, stairs, SLB continue plan  Ricard Dillon PT, DPT  03/04/2022, 2:18 PM

## 2022-03-04 NOTE — Therapy (Signed)
OUTPATIENT PHYSICAL THERAPY VESTIBULAR TREATMENT     Patient Name: Kathy Long MRN: [id number] DOB:1950-09-01, 71 y.o., female Today's Date: 03/05/2022  PCP: [name], MD REFERRING PROVIDER: [name], MD  PT End of Session - 03/05/22 1454     Visit Number 16    Number of Visits 25    Date for PT Re-Evaluation 03/09/22    PT Start Time 0851    PT Stop Time 0930    PT Time Calculation (min) 39 min    Equipment Utilized During Treatment Gait belt    Activity Tolerance Patient tolerated treatment well    Behavior During Therapy WFL for tasks assessed/performed                           Past Medical History:  Diagnosis Date   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   Anxiety    Arthritis    lower back, hands   Cancer ([place])    melanoma, shoulder   Chronic kidney disease    Closed fracture of distal lateral malleolus of left ankle 12/28/2017   Depression    GERD (gastroesophageal reflux disease)    Hyperlipidemia    Hypertension    Postoperative bile leak    Sleep apnea    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    Past Surgical History:  Procedure Laterality Date   ANKLE FRACTURE SURGERY Left    APPENDECTOMY  2010   BACK SURGERY     disectomy lumbar, scar tissue   BREAST CYST EXCISION Right 1970   axilla   buninectomy Bilateral    CATARACT EXTRACTION W/PHACO Left 04/12/2018   Procedure: CATARACT EXTRACTION PHACO AND INTRAOCULAR LENS PLACEMENT ([place])  LEFT TORIC LENS;  Surgeon: Leandrew Koyanagi, MD;  Location: [place];  Service: Ophthalmology;  Laterality: Left;   CATARACT EXTRACTION W/PHACO Right 05/10/2018   Procedure: CATARACT EXTRACTION PHACO AND INTRAOCULAR LENS PLACEMENT ([place])  RIGHT TORIC LENS;  Surgeon: Leandrew Koyanagi, MD;  Location: [place];  Service: Ophthalmology;  Laterality: Right;  DIABETIC   CHOLECYSTECTOMY N/A 07/05/2017   Procedure: LAPAROSCOPIC CHOLECYSTECTOMY WITH INTRAOPERATIVE CHOLANGIOGRAM;   Surgeon: Robert Bellow, MD;  Location: [facility];  Service: General;  Laterality: N/A;   COLONOSCOPY     2009 and color gaurd in 2018   COLONOSCOPY N/A 03/25/2020   Procedure: COLONOSCOPY;  Surgeon: [name], MD;  Location: [facility];  Service: Endoscopy;  Laterality: N/A;   DRUG INDUCED ENDOSCOPY N/A 05/06/2021   Procedure: DRUG INDUCED SLEEP ENDOSCOPY;  Surgeon: Melida Quitter, MD;  Location: [place];  Service: ENT;  Laterality: N/A;   ERCP N/A 07/12/2017   Procedure: ENDOSCOPIC RETROGRADE CHOLANGIOPANCREATOGRAPHY (ERCP);  Surgeon: Lucilla Lame, MD;  Location: [facility] END[facility]COPY;  Service: Endoscopy;  Laterality: N/A;   ERCP N/A 10/04/2017   Procedure: ENDOSCOPIC RETROGRADE CHOLANGIOPANCREATOGRAPHY (ERCP);  Surgeon: Lucilla Lame, MD;  Location: [facility] ENDOSCOPY;  Service: Endoscopy;  Laterality: N/A;   FOOT SURGERY     x 2   INCISIONAL HERNIA REPAIR N/A 12/04/2020   Procedure: VENTRAL INCISIONAL HERNIA REPAIR WITH MESH;  Surgeon: Armandina Gemma, MD;  Location: [facility];  Service: General;  Laterality: N/A;   MELANOMA EXCISION Left 2000   PALATOPLASTY N/A 04/08/2015   Procedure: PALATOPLASTY;  Surgeon: Beverly Gust, MD;  Location: [facility];  Service: ENT;  Laterality: N/A;   TONSILLECTOMY N/A 04/08/2015   Procedure: TONSILLECTOMY;  Surgeon: Beverly Gust,  MD;  Location: [facility];  Service: ENT;  Laterality: N/A;   TUBAL LIGATION     Patient Active Problem List   Diagnosis Date Noted   Chronic pain of right ankle 11/23/2021   Overweight (BMI 25.0-29.9) 11/23/2021   Paroxysmal atrial fibrillation ([place]) 10/09/2021   Aortic atherosclerosis ([place]) 05/12/2021   History of COVID-19 05/12/2021   Positive colorectal cancer screening using Cologuard test 02/05/2020   Esophageal dysphagia 12/11/2019   Hyperlipidemia LDL goal <100 12/10/2019   Right shoulder pain 10/17/2019   Arthritis of right acromioclavicular joint 08/15/2019   Piriformis syndrome of left side  08/15/2019   OSA (obstructive sleep apnea) 05/13/2019   CKD (chronic kidney disease) stage 3, GFR 30-59 ml/min (HCC) 04/13/2018   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   S/P laparoscopic cholecystectomy 07/19/2017   Hospital discharge follow-up 07/19/2017   Acoustic neuroma ([place]) 06/12/2017   Colon cancer screening 03/08/2017   Nocturia 12/09/2016   Major depressive disorder, 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[place]) 06/05/2016   Encounter for preventive health examination 03/07/2015   Incisional hernia, without obstruction or gangrene 09/28/2014   Anxiety state 11/19/2013   Gastro-esophageal reflux disease without esophagitis 11/19/2013   Squamous cell carcinoma of skin of face 10/21/2011   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 She reports she has upcoming appointment with her Cardiologist where she plans to ask about this. PT also encourages pt to reach out to her physician with her concerns. Pt reports she has not felt any change in her balance since previous session. She has continued to practice her HEP.    PAIN:  Are you having pain? Yes, still has some neck pain   PERTINENT HISTORY: Pt is here for vestibular PT evaluation regarding difficulties with balance and "unable to walk straight". Pt reports having been diagnosed with an acoustic neuroma in L ear about 6 yrs ago secondary to having balance issues. Went to physician and was sent to PT under the assumption the inner  ear was the cause of the balance issues. Pt states that balance did not get any better and went back to physician and had imaging done. Per pt, scan found the neuroma and plan was to monitor it. Gets imaging each year as a result and has show no changes as of now. Has been sent to an audiologist during this time and was given hearing aids. Pt recently went to PT for ankle s/p surgery for collapsed arch in January, reports no restrictions regarding ankle for therapy and feels PT helped tremendously. During surgery, pt reports she went into A-fib and is now seeing cardiologist. Was placed on several medications, and was having episodes of dizziness and hallucinations as side effects from one of the medications. Went back to her physician and dosages have been adjusted and reports no side effects since. Pt attributes all of these events to her balance being "off". Reports that sudden head movements can bring on the "off balance" sensation, as well as bending down. Pt reports she will get the feeling "something is  off" and will notice she has veered to one side or the other. PMH is significant for: acoustic neuroma, arthritis, hx of other cancer, chronic kidney disease, anxiety, depression and HTN. See chart for additional information.     PRECAUTIONS: Fall  WEIGHT BEARING RESTRICTIONS No  FALLS: Has patient fallen in last 6 months? Yes. Number of falls 1; in January fell on way to bathroom following ankle surgery, got off balance and cut head open on corner of chest, did not go to the ER, talked to PCP about head injury and reports PCP had no concerns.  LIVING ENVIRONMENT: deferred to next session secondary to limited time Lives with: lives with their family Lives in: Other town house Stairs:    Has following equipment at home:     PLOF: [place]: "be able to walk straight"  OBJECTIVE:  Objective data taken at eval unless otherwise specified   DIAGNOSTIC FINDINGS: Taken from  11/27/2021 MR BRAIN/IAC W WO CONTRAST: "IMPRESSION: 1. 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 2. Moderate chronic microvascular ischemic changes of the white matter in mild parenchymal volume loss, stable to mildly progressed since prior MRI."  COGNITION: Overall cognitive status: Within functional limits for tasks assessed    POSTURE: rounded shoulders and forward head   GAIT: Assistive device utilized: None Level of assistance: Complete Independence Comments: pt reports path deviations, unable to formally assess, plan to assess further next sessions, difficulty maintaining postural stability with scanning/head turns per pt report  PATIENT SURVEYS:  ABC scale 64% DHI 48: indicates moderate handicap FOTO 53 (goal score 59)   VESTIBULAR ASSESSMENT     SYMPTOM BEHAVIOR: Subjective history: complaints of imbalance, difficulty with turning head. Has acoustic neuroma (see subjective history above for details).   Non-Vestibular symptoms:  wears hearing aids B   Type of dizziness: Imbalance (Disequilibrium) and Unsteady with head/body turns   Frequency: with positional changes    Duration: unclear, will clarify future session   Aggravating factors: Induced by motion: occur when walking, bending down to the ground, turning body quickly, and turning head quickly   Relieving factors:  not reported   Progression of symptoms: unchanged   OCULOMOTOR EXAM:   Ocular Alignment: normal   Ocular ROM: No Limitations   Spontaneous Nystagmus: absent   Gaze-Induced Nystagmus: absent   Smooth Pursuits:  occasional saccadic intrusions   Saccades: hypometric/undershoots   Convergence/Divergence: WNL   VESTIBULAR - OCULAR REFLEX:    Slow VOR: Comment: noted corrective saccades with guided cervical rotation/VOR   VOR Cancellation: Comment: not tested   Head-Impulse Test: HIT Left: possible corrective saccade observed, however, pt with increased cervical stiffness B, had difficulty  performing test. Attempted modified version/self-performed head impulse test, but not able to achieve sufficient speed.    Dynamic Visual Acuity Test: Static: line 10, normal with no head movement Dynamic: line 6, impaired    VESTIBULAR TREATMENT: Today's treatment  Gait belt donned and CGA provided unless otherwise noted  NMR:  Amb. Over 10 meters - horizontal and vertical head turns 6x for each. Pt does report some stiffness felt L side neck with horizontal head turns, mild unsteadiness observed but no LOB.  Tandem walking 4x over 10 meters. Minimal use of step-strategy, improved.  VORx1, busy background, semi-tandem, horiz head turns 2x30 sec each LE. Mild lightheadedness reported with intervention, no LOB.  360 turns against wall x multiple reps, only 1x pt utilized UE support and step-strategy to correct for decreased balance. This is  an improvement from previous sessions. She reports some dizziness only with last round of intervention.  Amb around cones placed in a circle - practiced both to R and to L sides, where pt would look down at cones and then back up to neutral head positioning. Pt slightly unsteady when going to L side.   Amb followed by cues for quick pivot-turns x multiple reps. No unsteadiness and no symptoms, improvement from previous sessions.  Amb backwards 6x over 10 meters - feels some lightheadedness, mild unsteadiness with retro-stepping.   PATIENT EDUCATION: Education details: Pt educated throughout session about proper posture and technique with exercises. Improved exercise technique, movement at target joints, use of target muscles after min to mod verbal, visual, tactile cues.   Person educated: Patient Education method: Explanation, Demonstration, and Verbal cues Education comprehension: verbalized understanding, returned demonstration, verbal cues required, and needs further education   GOALS: Goals reviewed with patient? Yes  SHORT TERM GOALS:  Target date: 04/16/2022   Pt will be independent in home exercise program to improve strength/mobility for better functional independence with ADLs. Baseline: 7/18: initiate next visit; 8/9: initiated VORx1 seated; 9/8: HEP further advanced Goal status: IN PROGRESS   LONG TERM GOALS: Target date: 05/28/2022    Pt will increase FOTO score to at least 59 to demonstrate significant improvement in mobility and quality of life. Baseline: 7/18: 53, 9/8: 59 Goal status: MET  2.  Pt will reduce dizziness handicap inventory score to < 35, for less dizziness with ADLs and increased safety with home and community tasks. Baseline: 7/18: 48; 9/8: 24 Goal status: MET  3.  Pt will increase ABC scale score > 80% to demonstrate better functional mobility and better confidence with ADLs. Baseline: 7/18: 64%; 9/8: 71.88% Goal status: IN PROGRESS  4.  Pt will increase DGI score to at least 23/24 as to reduce fall risk and improve dynamic gait safety with community ambulation.  Baseline: 7/18: deferred to next session; 8/9: revised from FGA to DGI. Pt scored 21/24 on this date; 9/8: 22/24 Goal status:Partially MET  HOME EXERCISE PROGRAM:   10/4: Instructed pt to increase total time VORx1 horizontal head turns to 5 minutes/day (performed in 30 sec to 1 min intervals), seated.   Updated 9/8: Access Code: [email or web address] URL: [email or web address] Date: 02/05/2022 Prepared by: Ricard Dillon  Exercises - Corner Balance Feet Apart: Eyes Open With Head Turns  - 1 x daily - 7 x weekly - 2 sets - 15 reps - Shoulder External Rotation and Scapular Retraction with Resistance  - 1 x daily - 5 x weekly - 3 sets - 10 reps  Previous: 8/25: reinforced importance of performing all of HEP, specifically VOR intervention  Added 8/18: Access Code: [email or web address] URL: [email or web address] Date: 01/15/2022 Prepared by: Ricard Dillon  Exercises - Supine Chin Tuck  - 1 x daily - 5 x weekly - 2 sets - 10  reps - 3 seconds hold  01/06/22: Seated VORx1 vertical and horizontal head turns 3 minutes total of each performed in up to 60 second bouts. Instructed to rest if dizziness reaches >2/10.   ASSESSMENT:  CLINICAL IMPRESSION: Pt exhibits improvement in balance with turning and tandem stance/gait interventions with fewer errors. While pt shows progress, she continues to have difficulty with activities requiring horizontal head turns and increased difficult with retro-stepping. The pt will benefit from further skilled PT to improve balance, dizziness, pain and mobility to decrease fall risk and increase QOL.   OBJECTIVE IMPAIRMENTS Abnormal gait, decreased  activity tolerance, decreased balance, decreased coordination, decreased endurance, decreased knowledge of use of DME, decreased mobility, difficulty walking, decreased ROM, dizziness, hypomobility, impaired vision/preception, improper body mechanics, and postural dysfunction.   ACTIVITY LIMITATIONS bending, sitting, standing, squatting, stairs, transfers, dressing, locomotion level, and caring for others  PARTICIPATION LIMITATIONS: cleaning, laundry, interpersonal relationship, shopping, community activity, and yard work  PERSONAL FACTORS Age, Past/current experiences, Sex, Time since onset of injury/illness/exacerbation, and 3+ comorbidities: acoustic neuroma, arthritis, chronic kidney disease, anxiety, depression, hx of other cancer, and HTN  are also affecting patient's functional outcome.   REHAB POTENTIAL: Good  CLINICAL DECISION MAKING: Evolving/moderate complexity  EVALUATION COMPLEXITY: Moderate   PLAN: PT FREQUENCY: 2x/week  PT DURATION: 12 weeks  PLANNED INTERVENTIONS: Therapeutic exercises, Therapeutic activity, Neuromuscular re-education, Balance training, Gait training, Patient/Family education, Self Care, Joint mobilization, Stair training, Vestibular training, Canalith repositioning, Visual/preceptual  remediation/compensation, DME instructions, Dry Needling, Electrical stimulation, Spinal mobilization, Cryotherapy, Moist heat, Compression bandaging, Taping, Manual therapy, and Re-evaluation  PLAN FOR NEXT SESSION: strength, balance, manual therapy, VOR, stairs, SLB continue plan  Ricard Dillon PT, DPT  03/05/2022, 3:09 PM

## 2022-03-05 ENCOUNTER — Ambulatory Visit: Payer: Medicare PPO

## 2022-03-05 DIAGNOSIS — R2681 Unsteadiness on feet: Secondary | ICD-10-CM

## 2022-03-05 DIAGNOSIS — R42 Dizziness and giddiness: Secondary | ICD-10-CM | POA: Diagnosis not present

## 2022-03-05 DIAGNOSIS — M542 Cervicalgia: Secondary | ICD-10-CM

## 2022-03-05 DIAGNOSIS — M6281 Muscle weakness (generalized): Secondary | ICD-10-CM | POA: Diagnosis not present

## 2022-03-05 DIAGNOSIS — R262 Difficulty in walking, not elsewhere classified: Secondary | ICD-10-CM | POA: Diagnosis not present

## 2022-03-08 ENCOUNTER — Ambulatory Visit: Payer: Medicare PPO

## 2022-03-10 ENCOUNTER — Ambulatory Visit: Payer: Medicare PPO

## 2022-03-16 DIAGNOSIS — R0982 Postnasal drip: Secondary | ICD-10-CM | POA: Diagnosis not present

## 2022-03-16 DIAGNOSIS — R42 Dizziness and giddiness: Secondary | ICD-10-CM | POA: Diagnosis not present

## 2022-03-17 ENCOUNTER — Ambulatory Visit: Payer: Medicare PPO

## 2022-03-17 DIAGNOSIS — M6281 Muscle weakness (generalized): Secondary | ICD-10-CM | POA: Diagnosis not present

## 2022-03-17 DIAGNOSIS — R42 Dizziness and giddiness: Secondary | ICD-10-CM | POA: Diagnosis not present

## 2022-03-17 DIAGNOSIS — R2681 Unsteadiness on feet: Secondary | ICD-10-CM | POA: Diagnosis not present

## 2022-03-17 DIAGNOSIS — M542 Cervicalgia: Secondary | ICD-10-CM | POA: Diagnosis not present

## 2022-03-17 DIAGNOSIS — R262 Difficulty in walking, not elsewhere classified: Secondary | ICD-10-CM | POA: Diagnosis not present

## 2022-03-17 NOTE — Therapy (Signed)
OUTPATIENT PHYSICAL THERAPY VESTIBULAR TREATMENT/RECERT     Patient Name: Kathy Long MRN: [id number] DOB:Sep 09, 1950, 71 y.o., female Today's Date: 03/17/2022  PCP: [name], MD REFERRING PROVIDER: [name], MD  PT End of Session - 03/17/22 1625     Visit Number 17    Number of Visits 29    Date for PT Re-Evaluation 06/09/22    PT Start Time 8101    PT Stop Time 7510    PT Time Calculation (min) 40 min    Equipment Utilized During Treatment Gait belt    Activity Tolerance Patient tolerated treatment well    Behavior During Therapy WFL for tasks assessed/performed                            Past Medical History:  Diagnosis Date   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   Anxiety    Arthritis    lower back, hands   Cancer ([place])    melanoma, shoulder   Chronic kidney disease    Closed fracture of distal lateral malleolus of left ankle 12/28/2017   Depression    GERD (gastroesophageal reflux disease)    Hyperlipidemia    Hypertension    Postoperative bile leak    Sleep apnea    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    Past Surgical History:  Procedure Laterality Date   ANKLE FRACTURE SURGERY Left    APPENDECTOMY  2010   BACK SURGERY     disectomy lumbar, scar tissue   BREAST CYST EXCISION Right 1970   axilla   buninectomy Bilateral    CATARACT EXTRACTION W/PHACO Left 04/12/2018   Procedure: CATARACT EXTRACTION PHACO AND INTRAOCULAR LENS PLACEMENT ([place])  LEFT TORIC LENS;  Surgeon: Leandrew Koyanagi, MD;  Location: [place];  Service: Ophthalmology;  Laterality: Left;   CATARACT EXTRACTION W/PHACO Right 05/10/2018   Procedure: CATARACT EXTRACTION PHACO AND INTRAOCULAR LENS PLACEMENT ([place])  RIGHT TORIC LENS;  Surgeon: Leandrew Koyanagi, MD;  Location: [place];  Service: Ophthalmology;  Laterality: Right;  DIABETIC   CHOLECYSTECTOMY N/A 07/05/2017   Procedure: LAPAROSCOPIC CHOLECYSTECTOMY WITH INTRAOPERATIVE  CHOLANGIOGRAM;  Surgeon: Robert Bellow, MD;  Location: [facility];  Service: General;  Laterality: N/A;   COLONOSCOPY     2009 and color gaurd in 2018   COLONOSCOPY N/A 03/25/2020   Procedure: COLONOSCOPY;  Surgeon: [name], MD;  Location: [facility];  Service: Endoscopy;  Laterality: N/A;   DRUG INDUCED ENDOSCOPY N/A 05/06/2021   Procedure: DRUG INDUCED SLEEP ENDOSCOPY;  Surgeon: Melida Quitter, MD;  Location: [place];  Service: ENT;  Laterality: N/A;   ERCP N/A 07/12/2017   Procedure: ENDOSCOPIC RETROGRADE CHOLANGIOPANCREATOGRAPHY (ERCP);  Surgeon: Lucilla Lame, MD;  Location: [facility];  Service: Endoscopy;  Laterality: N/A;   ERCP N/A 10/04/2017   Procedure: ENDOSCOPIC RETROGRADE CHOLANGIOPANCREATOGRAPHY (ERCP);  Surgeon: Lucilla Lame, MD;  Location: [facility];  Service: Endoscopy;  Laterality: N/A;   FOOT SURGERY     x 2   INCISIONAL HERNIA REPAIR N/A 12/04/2020   Procedure: VENTRAL INCISIONAL HERNIA REPAIR WITH MESH;  Surgeon: Armandina Gemma, MD;  Location: [facility];  Service: General;  Laterality: N/A;   MELANOMA EXCISION Left 2000   PALATOPLASTY N/A 04/08/2015   Procedure: PALATOPLASTY;  Surgeon: Beverly Gust, MD;  Location: [facility];  Service: ENT;  Laterality: N/A;   TONSILLECTOMY N/A 04/08/2015   Procedure: TONSILLECTOMY;  Surgeon: Freda Munro  Tami Ribas, MD;  Location: [facility];  Service: ENT;  Laterality: N/A;   TUBAL LIGATION     Patient Active Problem List   Diagnosis Date Noted   Chronic pain of right ankle 11/23/2021   Overweight (BMI 25.0-29.9) 11/23/2021   Paroxysmal atrial fibrillation ([place]) 10/09/2021   Aortic atherosclerosis ([place]) 05/12/2021   History of COVID-19 05/12/2021   Positive colorectal cancer screening using Cologuard test 02/05/2020   Esophageal dysphagia 12/11/2019   Hyperlipidemia LDL goal <100 12/10/2019   Right shoulder pain 10/17/2019   Arthritis of right acromioclavicular joint 08/15/2019   Piriformis syndrome  of left side 08/15/2019   OSA (obstructive sleep apnea) 05/13/2019   CKD (chronic kidney disease) stage 3, GFR 30-59 ml/min (HCC) 04/13/2018   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   S/P laparoscopic cholecystectomy 07/19/2017   Hospital discharge follow-up 07/19/2017   Acoustic neuroma ([place]) 06/12/2017   Colon cancer screening 03/08/2017   Nocturia 12/09/2016   Major depressive disorder, 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[place]) 06/05/2016   Encounter for preventive health examination 03/07/2015   Incisional hernia, without obstruction or gangrene 09/28/2014   Anxiety state 11/19/2013   Gastro-esophageal reflux disease without esophagitis 11/19/2013   Squamous cell carcinoma of skin of face 10/21/2011   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  Pt reports she was instructed to talk to her primary care and cardiologist about her medications further. Pt planning to ask cardiologist about ablation. Pt feels her balance has not worsened, but that it has only improved a little.    PAIN:  Are you having pain? Not rated today   PERTINENT HISTORY: Pt is here for vestibular PT evaluation regarding difficulties with balance and "unable to walk straight". Pt reports having been diagnosed with an acoustic neuroma in L ear about 6 yrs ago secondary to having balance issues. Went to physician and was sent to PT under the assumption the inner ear was the cause of the balance issues. Pt states that  balance did not get any better and went back to physician and had imaging done. Per pt, scan found the neuroma and plan was to monitor it. Gets imaging each year as a result and has show no changes as of now. Has been sent to an audiologist during this time and was given hearing aids. Pt recently went to PT for ankle s/p surgery for collapsed arch in January, reports no restrictions regarding ankle for therapy and feels PT helped tremendously. During surgery, pt reports she went into A-fib and is now seeing cardiologist. Was placed on several medications, and was having episodes of dizziness and hallucinations as side effects from one of the medications. Went back to her physician and dosages have been adjusted and reports no side effects since. Pt attributes all of these events to her balance being "off". Reports that sudden head movements can bring on the "off balance" sensation, as well as bending down. Pt reports she will get the feeling "something is off" and will notice she has veered to one side or the other. PMH  is significant for: acoustic neuroma, arthritis, hx of other cancer, chronic kidney disease, anxiety, depression and HTN. See chart for additional information.     PRECAUTIONS: Fall  WEIGHT BEARING RESTRICTIONS No  FALLS: Has patient fallen in last 6 months? Yes. Number of falls 1; in January fell on way to bathroom following ankle surgery, got off balance and cut head open on corner of chest, did not go to the ER, talked to PCP about head injury and reports PCP had no concerns.  LIVING ENVIRONMENT:  Lives with: lives with their family Lives in: Other town house Stairs:    Has following equipment at home:     PLOF: [place]: "be able to walk straight"  OBJECTIVE:  Objective data taken at eval unless otherwise specified   DIAGNOSTIC FINDINGS: Taken from 11/27/2021 MR BRAIN/IAC W WO CONTRAST: "IMPRESSION: 1. 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 2. Moderate chronic microvascular ischemic changes of the white matter in mild parenchymal volume loss, stable to mildly progressed since prior MRI."  COGNITION: Overall cognitive status: Within functional limits for tasks assessed    POSTURE: rounded shoulders and forward head   GAIT: Assistive device utilized: None Level of assistance: Complete Independence Comments: pt reports path deviations, unable to formally assess, plan to assess further next sessions, difficulty maintaining postural stability with scanning/head turns per pt report  PATIENT SURVEYS:  ABC scale 64% DHI 48: indicates moderate handicap FOTO 53 (goal score 59)   VESTIBULAR ASSESSMENT     SYMPTOM BEHAVIOR: Subjective history: complaints of imbalance, difficulty with turning head. Has acoustic neuroma (see subjective history above for details).   Non-Vestibular symptoms:  wears hearing aids B   Type of dizziness: Imbalance (Disequilibrium) and Unsteady with head/body turns   Frequency: with positional changes    Duration: unclear, will clarify future session   Aggravating factors: Induced by motion: occur when walking, bending down to the ground, turning body quickly, and turning head quickly   Relieving factors:  not reported   Progression of symptoms: unchanged   OCULOMOTOR EXAM:   Ocular Alignment: normal   Ocular ROM: No Limitations   Spontaneous Nystagmus: absent   Gaze-Induced Nystagmus: absent   Smooth Pursuits:  occasional saccadic intrusions   Saccades: hypometric/undershoots   Convergence/Divergence: WNL   VESTIBULAR - OCULAR REFLEX:    Slow VOR: Comment: noted corrective saccades with guided cervical rotation/VOR   VOR Cancellation: Comment: not tested   Head-Impulse Test: HIT Left: possible corrective saccade observed, however, pt with increased cervical stiffness B, had difficulty performing test. Attempted modified version/self-performed head impulse test, but not able to  achieve sufficient speed.    Dynamic Visual Acuity Test: Static: line 10, normal with no head movement Dynamic: line 6, impaired    VESTIBULAR TREATMENT: Today's treatment  Gait belt donned and CGA provided unless otherwise noted  NMR:  Goals reassessed for recertification. PT instructed pt in technique with testing and indications of test performance. Please refer to goal section for details.  Reassessment of DVA test (previous change 10 to 6): 9 to 6, still indicates impairment.   Tandem walk over 10 meters 1x mild unsteadiness   Reviewed new additions to HEP and safety precautions with performing interventions at home.  Part of session dedicated to discussion of pt's remaining symptoms, recent doctor's appointment (unbilled).   PATIENT EDUCATION: Education details: Pt educated throughout session about proper posture and technique with exercises. Improved exercise technique, movement at target joints, use of target muscles  after min to mod verbal, visual, tactile cues.   Person educated: Patient Education method: Explanation, Demonstration, and Verbal cues Education comprehension: verbalized understanding, returned demonstration, verbal cues required, and needs further education   GOALS: Goals reviewed with patient? Yes  SHORT TERM GOALS: Target date: 04/28/2022   Pt will be independent in home exercise program to improve strength/mobility for better functional independence with ADLs. Baseline: 7/18: initiate next visit; 8/9: initiated VORx1 seated; 9/8: HEP further advanced; 10/18: pt HEP advanced Goal status: IN PROGRESS   LONG TERM GOALS: Target date: 06/09/2022    Pt will increase FOTO score to at least 59 to demonstrate significant improvement in mobility and quality of life. Baseline: 7/18: 53, 9/8: 59; 10/18: 60 Goal status: MET  2.  Pt will reduce dizziness handicap inventory score to < 35, for less dizziness with ADLs and increased safety with home and  community tasks. Baseline: 7/18: 48; 9/8: 24 Goal status: MET  3.  Pt will increase ABC scale score > 80% to demonstrate better functional mobility and better confidence with ADLs. Baseline: 7/18: 64%; 9/8: 71.88%, 10/18: 93.13%  Goal status: MET  4.  Pt will increase DGI score to at least 23/24 as to reduce fall risk and improve dynamic gait safety with community ambulation.  Baseline: 7/18: deferred to next session; 8/9: revised from FGA to DGI. Pt scored 21/24 on this date; 9/8: 22/24; 10/18: 23/24 Goal status:Partially MET  5.  Pt will demonstrate ability to perform tandem gait over 10 meters with no greater than 3 errors in order to improve balance with narrow BOS gait necessary for navigating obstacles and ambulating safely in the community. Baseline: 10/18: multiple errors >8 Goal status: NEW  HOME EXERCISE PROGRAM:   Updated 10/18: Access Code: [email or web address] URL: [email or web address] Date: 03/17/2022 Prepared by: Ricard Dillon  Exercises - Tandem Walking with Counter Support  - 1 x daily - 7 x weekly - 3 sets - 10 reps - Walking with Head Rotation  - 1 x daily - 7 x weekly - 3 sets - 10 reps - discussed and wrote on instructions to perform near support surface - Tandem Stance with Support  - 1 x daily - 7 x weekly - 3 sets - 2 reps - 30 hold   10/4: Instructed pt to increase total time VORx1 horizontal head turns to 5 minutes/day (performed in 30 sec to 1 min intervals), seated.   Updated 9/8: Access Code: [email or web address] URL: [email or web address] Date: 02/05/2022 Prepared by: Ricard Dillon  Exercises - Corner Balance Feet Apart: Eyes Open With Head Turns  - 1 x daily - 7 x weekly - 2 sets - 15 reps - Shoulder External Rotation and Scapular Retraction with Resistance  - 1 x daily - 5 x weekly - 3 sets - 10 reps  Previous: 8/25: reinforced importance of performing all of HEP, specifically VOR intervention  Added 8/18: Access Code: [email or web address] URL:  [email or web address] Date: 01/15/2022 Prepared by: Ricard Dillon  Exercises - Supine Chin Tuck  - 1 x daily - 5 x weekly - 2 sets - 10 reps - 3 seconds hold  01/06/22: Seated VORx1 vertical and horizontal head turns 3 minutes total of each performed in up to 60 second bouts. Instructed to rest if dizziness reaches >2/10.   ASSESSMENT:  CLINICAL IMPRESSION: Goals reassessed for recert. Pt making gains AEB improvement on FOTO, DGI, and achieving ABC scale balance confidence goal. This indicates improvement in functional mobility and balance. While pt making gains, she still  shows impairments of postural stability when ambulating with NBOS (tandem gait) or with head turns. PT has updated HEP to address these higher-level balance deficits. PT and pt also discussed decreasing frequency to 1x/week. Pt agreeable to plan. Patient's condition has the potential to improve in response to therapy. Maximum improvement is yet to be obtained. The anticipated improvement is attainable and reasonable in a generally predictable time. The pt will benefit from further skilled PT to improve balance, dizziness, pain and mobility to decrease fall risk and increase QOL.   OBJECTIVE IMPAIRMENTS Abnormal gait, decreased activity tolerance, decreased balance, decreased coordination, decreased endurance, decreased knowledge of use of DME, decreased mobility, difficulty walking, decreased ROM, dizziness, hypomobility, impaired vision/preception, improper body mechanics, and postural dysfunction.   ACTIVITY LIMITATIONS bending, sitting, standing, squatting, stairs, transfers, dressing, locomotion level, and caring for others  PARTICIPATION LIMITATIONS: cleaning, laundry, interpersonal relationship, shopping, community activity, and yard work  PERSONAL FACTORS Age, Past/current experiences, Sex, Time since onset of injury/illness/exacerbation, and 3+ comorbidities: acoustic neuroma, arthritis, chronic kidney  disease, anxiety, depression, hx of other cancer, and HTN  are also affecting patient's functional outcome.   REHAB POTENTIAL: Good  CLINICAL DECISION MAKING: Evolving/moderate complexity  EVALUATION COMPLEXITY: Moderate   PLAN: PT FREQUENCY: 1x/week  PT DURATION: 12 weeks  PLANNED INTERVENTIONS: Therapeutic exercises, Therapeutic activity, Neuromuscular re-education, Balance training, Gait training, Patient/Family education, Self Care, Joint mobilization, Stair training, Vestibular training, Canalith repositioning, Visual/preceptual remediation/compensation, DME instructions, Dry Needling, Electrical stimulation, Spinal mobilization, Cryotherapy, Moist heat, Compression bandaging, Taping, Manual therapy, and Re-evaluation  PLAN FOR NEXT SESSION: strength, balance, manual therapy, VOR, stairs, SLB, tandem gait continue plan  Ricard Dillon PT, DPT  03/17/2022, 5:06 PM

## 2022-03-18 ENCOUNTER — Ambulatory Visit: Payer: Medicare PPO

## 2022-03-22 ENCOUNTER — Telehealth: Payer: Self-pay | Admitting: Internal Medicine

## 2022-03-22 NOTE — Telephone Encounter (Unsigned)
Copied from [place] [id number]. Topic: Medicare AWV >> Mar 22, 2022  3:25 PM Devoria Glassing wrote: Reason for CRM: Left message for patient to schedule Annual Wellness Visit.  Please schedule with Nurse Health Advisor Denisa O'Brien-Blaney, LPN at [facility]. This appt can be telephone or office visit.  Please call [phone] ask for [facility]

## 2022-03-25 ENCOUNTER — Ambulatory Visit: Payer: Medicare PPO

## 2022-03-25 DIAGNOSIS — C44519 Basal cell carcinoma of skin of other part of trunk: Secondary | ICD-10-CM | POA: Diagnosis not present

## 2022-03-25 DIAGNOSIS — D2262 Melanocytic nevi of left upper limb, including shoulder: Secondary | ICD-10-CM | POA: Diagnosis not present

## 2022-03-25 DIAGNOSIS — D485 Neoplasm of uncertain behavior of skin: Secondary | ICD-10-CM | POA: Diagnosis not present

## 2022-03-25 DIAGNOSIS — Z85820 Personal history of malignant melanoma of skin: Secondary | ICD-10-CM | POA: Diagnosis not present

## 2022-03-25 DIAGNOSIS — D2272 Melanocytic nevi of left lower limb, including hip: Secondary | ICD-10-CM | POA: Diagnosis not present

## 2022-03-25 DIAGNOSIS — C4441 Basal cell carcinoma of skin of scalp and neck: Secondary | ICD-10-CM | POA: Diagnosis not present

## 2022-03-25 DIAGNOSIS — D2261 Melanocytic nevi of right upper limb, including shoulder: Secondary | ICD-10-CM | POA: Diagnosis not present

## 2022-03-30 ENCOUNTER — Ambulatory Visit: Payer: Medicare PPO

## 2022-03-30 ENCOUNTER — Telehealth: Payer: Self-pay | Admitting: Internal Medicine

## 2022-03-30 NOTE — Telephone Encounter (Signed)
LMTCB

## 2022-03-30 NOTE — Telephone Encounter (Signed)
Pt called in stating she has been feeling dizziness, lightheadness and feeling faint for a couple weeks. Sent to access nurse

## 2022-03-31 ENCOUNTER — Emergency Department: Payer: Medicare PPO

## 2022-03-31 ENCOUNTER — Ambulatory Visit: Payer: Medicare PPO

## 2022-03-31 ENCOUNTER — Other Ambulatory Visit: Payer: Self-pay

## 2022-03-31 ENCOUNTER — Emergency Department
Admission: EM | Admit: 2022-03-31 | Discharge: 2022-03-31 | Disposition: A | Discharge status: Home / Self Care | Payer: Medicare PPO | Attending: Student in an Organized Health Care Education/Training Program | Admitting: Student in an Organized Health Care Education/Training Program

## 2022-03-31 DIAGNOSIS — R55 Syncope and collapse: Secondary | ICD-10-CM | POA: Diagnosis not present

## 2022-03-31 DIAGNOSIS — I480 Paroxysmal atrial fibrillation: Secondary | ICD-10-CM | POA: Insufficient documentation

## 2022-03-31 DIAGNOSIS — Z7901 Long term (current) use of anticoagulants: Secondary | ICD-10-CM | POA: Insufficient documentation

## 2022-03-31 DIAGNOSIS — R42 Dizziness and giddiness: Secondary | ICD-10-CM | POA: Diagnosis present

## 2022-03-31 LAB — COMPREHENSIVE METABOLIC PANEL
ALT: 24 U/L (ref 0–44)
AST: 26 U/L (ref 15–41)
Albumin: 3.9 g/dL (ref 3.5–5.0)
Alkaline Phosphatase: 90 U/L (ref 38–126)
Anion gap: 4 — ABNORMAL LOW (ref 5–15)
BUN: 22 mg/dL (ref 8–23)
CO2: 27 mmol/L (ref 22–32)
Calcium: 9.4 mg/dL (ref 8.9–10.3)
Chloride: 110 mmol/L (ref 98–111)
Creatinine, Ser: 1.12 mg/dL — ABNORMAL HIGH (ref 0.44–1.00)
GFR, Estimated: 52 mL/min — ABNORMAL LOW (ref >60)
Glucose, Bld: 86 mg/dL (ref 70–99)
Potassium: 4.3 mmol/L (ref 3.5–5.1)
Sodium: 141 mmol/L (ref 135–145)
Total Bilirubin: 1.1 mg/dL (ref 0.3–1.2)
Total Protein: 6.8 g/dL (ref 6.5–8.1)

## 2022-03-31 LAB — CBC WITH DIFFERENTIAL/PLATELET
Abs Immature Granulocytes: 0.01 10*3/uL (ref 0.00–0.07)
Basophils Absolute: 0 10*3/uL (ref 0.0–0.1)
Basophils Relative: 1 %
Eosinophils Absolute: 0.2 10*3/uL (ref 0.0–0.5)
Eosinophils Relative: 5 %
HCT: 40.3 % (ref 36.0–46.0)
Hemoglobin: 13.5 g/dL (ref 12.0–15.0)
Immature Granulocytes: 0 %
Lymphocytes Relative: 27 %
Lymphs Abs: 1.3 10*3/uL (ref 0.7–4.0)
MCH: 31.5 pg (ref 26.0–34.0)
MCHC: 33.5 g/dL (ref 30.0–36.0)
MCV: 93.9 fL (ref 80.0–100.0)
Monocytes Absolute: 0.5 10*3/uL (ref 0.1–1.0)
Monocytes Relative: 11 %
Neutro Abs: 2.6 10*3/uL (ref 1.7–7.7)
Neutrophils Relative %: 56 %
Platelets: 244 10*3/uL (ref 150–400)
RBC: 4.29 MIL/uL (ref 3.87–5.11)
RDW: 12.5 % (ref 11.5–15.5)
WBC: 4.7 10*3/uL (ref 4.0–10.5)
nRBC: 0 % (ref 0.0–0.2)

## 2022-03-31 LAB — MAGNESIUM: Magnesium: 2 mg/dL (ref 1.7–2.4)

## 2022-03-31 LAB — TROPONIN I (HIGH SENSITIVITY): Troponin I (High Sensitivity): 4 ng/L (ref <18)

## 2022-03-31 NOTE — ED Provider Notes (Signed)
[facility] Provider Note    Event Date/Time   First MD Initiated Contact with Patient 03/31/22 1007     (approximate)   History   Near Syncope   HPI  Kathy Long is a 71 y.o. female with a history of A-fib on Eliquis as well as flecainide metoprolol presents to the ER for evaluation of multiple episodes of brief dizziness and tunnel vision and yesterday.  States the symptoms have been ongoing for several weeks.  States that they would come out of the blue lasting less than a minute.  No numbness or tingling.  Just felt like she would develop tunnel vision and some blurry vision associated with it.  Denies any chest pain.  Did feel like she was in A-fib more yesterday.  Denies any headaches.  No chest pain or pressure.  States she was feeling well today but was directed to the ER by her PCP.     Physical Exam   Triage Vital Signs: ED Triage Vitals  Enc Vitals Group     BP 03/31/22 0958 (!) 119/91     Pulse Rate 03/31/22 0958 76     Resp 03/31/22 0958 16     Temp 03/31/22 0958 98.6 F (37 C)     Temp Source 03/31/22 0958 Axillary     SpO2 03/31/22 0958 97 %     Weight 03/31/22 0959 143 lb (64.9 kg)     Height 03/31/22 0959 '5\' 2"'$  (1.575 m)     Head Circumference --      Peak Flow --      Pain Score 03/31/22 0959 0     Pain Loc --      Pain Edu? --      Excl. in [place]? --     Most recent vital signs: Vitals:   03/31/22 0958 03/31/22 1019  BP: (!) 119/91 (!) 151/85  Pulse: 76 71  Resp: 16 11  Temp: 98.6 F (37 C)   SpO2: 97%      Constitutional: Alert  Eyes: Conjunctivae are normal.  Head: Atraumatic. Nose: No congestion/rhinnorhea. Mouth/Throat: Mucous membranes are moist.   Neck: Painless ROM.  Cardiovascular:   Good peripheral circulation. Irregularly irregular Respiratory: Normal respiratory effort.  No retractions.  Gastrointestinal: Soft and nontender.  Musculoskeletal:  no deformity Neurologic:  MAE spontaneously. No gross  focal neurologic deficits are appreciated.  Skin:  Skin is warm, dry and intact. No rash noted. Psychiatric: Mood and affect are normal. Speech and behavior are normal.    ED Results / Procedures / Treatments   Labs (all labs ordered are listed, but only abnormal results are displayed) Labs Reviewed  COMPREHENSIVE METABOLIC PANEL - Abnormal; Notable for the following components:      Result Value   Creatinine, Ser 1.12 (*)    GFR, Estimated 52 (*)    Anion gap 4 (*)    All other components within normal limits  CBC WITH DIFFERENTIAL/PLATELET  MAGNESIUM  TROPONIN I (HIGH SENSITIVITY)  TROPONIN I (HIGH SENSITIVITY)     EKG  ED ECG REPORT I, Merlyn Lot, the attending physician, personally viewed and interpreted this ECG.   Date: 03/31/2022  EKG Time: 10:02  Rate: 80  Rhythm: afib  Axis: normal  Intervals: normal qt  ST&T Change: nonspecific st abn,  poor r wave progressions    RADIOLOGY Please see ED Course for my review and interpretation.  I personally reviewed all radiographic images ordered to evaluate for the  above acute complaints and reviewed radiology reports and findings.  These findings were personally discussed with the patient.  Please see medical record for radiology report.    PROCEDURES:  Critical Care performed:   Procedures   MEDICATIONS ORDERED IN ED: Medications - No data to display   IMPRESSION / MDM / [place] / ED COURSE  I reviewed the triage vital signs and the nursing notes.                              Differential diagnosis includes, but is not limited to, dehydration, electrolyte abnormality, dysrhythmia, ACS, TIA,  Patient presenting to the ER for evaluation of symptoms as described above.  Based on symptoms, risk factors and considered above differential, this presenting complaint could reflect a potentially life-threatening illness therefore the patient will be placed on continuous pulse oximetry and  telemetry for monitoring.  Laboratory evaluation will be sent to evaluate for the above complaints.      Clinical Course as of 03/31/22 1152  Wed Mar 31, 2022  1033 Chest x-ray on my review and interpretation without any sign of infiltrate or consolidation. [PR]  1151 Patient reassessed.  Remains asymptomatic in no acute distress.  Her work-up is reassuring.  Discussed case in consultation with Dr. Garen Lah.  Based on her well appearance stable vital signs and known history of A-fib no indication for hospitalization at this time.  We will arrange close outpatient follow-up with cardiology as well as EP.  Patient agreeable to this plan.  Discussed signs and symptoms for which she should return to the ER. [PR]    Clinical Course User Index [PR] Merlyn Lot, MD     FINAL CLINICAL IMPRESSION(S) / ED DIAGNOSES   Final diagnoses:  Paroxysmal atrial fibrillation ([place])     Rx / DC Orders   ED Discharge Orders     None        Note:  This document was prepared using Dragon voice recognition software and may include unintentional dictation errors.    Merlyn Lot, MD 03/31/22 1152

## 2022-03-31 NOTE — ED Notes (Signed)
Blue top sent with other labs. And extra lt green top.

## 2022-03-31 NOTE — ED Notes (Addendum)
Pt to ED for a fib with near syncope, "25 episodes" yesterday. Episodes last under 5 seconds. Episodes include dizziness and tunneling of vision (peripheral vision is dark). Pt has acoustic neuroma in L ear (since 6 years). Hx feeling off balance, has seen ENT. Pt has had a fib since January 2023.   Pt takes '200mg'$  flecainide per day (divided dose), eliquis and metoprolol. Sees cardiologist. Pt currently in bed, denies dizziness SOB and chest pain at this time.   Blood drawn, 20g placed, and cardiac monitor initiated.

## 2022-03-31 NOTE — ED Triage Notes (Signed)
Pt states that for the past couple weeks she has been having the feeling that she was going to pass out and states yesterday she approx 25 "spells" states that she was in a.fib yesterday as well, went to [facility] today who sent the pt to the ER

## 2022-04-01 ENCOUNTER — Telehealth: Payer: Self-pay | Admitting: Cardiovascular Disease

## 2022-04-01 NOTE — Telephone Encounter (Signed)
STAT if patient feels like he/she is going to faint   Are you dizzy now?   No  Do you feel faint or have you passed out?   Have felt faint but not passed out  Do you have any other symptoms? Lightheaded and off balance  Have you checked your HR and BP (record if available)?   No  Patient stated she has been having dizzy spells.  She was recently in the ER and was told she was in Afib.

## 2022-04-02 NOTE — Telephone Encounter (Signed)
Left message for pt to call back  °

## 2022-04-05 DIAGNOSIS — D235 Other benign neoplasm of skin of trunk: Secondary | ICD-10-CM | POA: Diagnosis not present

## 2022-04-05 DIAGNOSIS — C44619 Basal cell carcinoma of skin of left upper limb, including shoulder: Secondary | ICD-10-CM | POA: Diagnosis not present

## 2022-04-06 ENCOUNTER — Ambulatory Visit: Payer: Medicare PPO

## 2022-04-06 NOTE — Telephone Encounter (Signed)
Left message for pt to call back  °

## 2022-04-07 ENCOUNTER — Ambulatory Visit: Payer: Medicare PPO | Attending: Internal Medicine

## 2022-04-07 DIAGNOSIS — R2681 Unsteadiness on feet: Secondary | ICD-10-CM | POA: Diagnosis not present

## 2022-04-07 DIAGNOSIS — R42 Dizziness and giddiness: Secondary | ICD-10-CM | POA: Insufficient documentation

## 2022-04-07 DIAGNOSIS — R262 Difficulty in walking, not elsewhere classified: Secondary | ICD-10-CM | POA: Insufficient documentation

## 2022-04-07 NOTE — Telephone Encounter (Signed)
Pt has appt w/ Dr. Rockey Situ 04/12/22 for ED f/u.

## 2022-04-07 NOTE — Therapy (Signed)
OUTPATIENT PHYSICAL THERAPY VESTIBULAR TREATMENT     Patient Name: Kathy Long MRN: [id number] DOB:1950-07-22, 71 y.o., female Today's Date: 04/07/2022  PCP: [name], MD REFERRING PROVIDER: [name], MD  PT End of Session - 04/07/22 1611     Visit Number 18    Number of Visits 29    Date for PT Re-Evaluation 06/09/22    Authorization Type [employer]    Authorization Time Period 03/17/22-06/09/21    PT Start Time 1607    PT Stop Time 1640    PT Time Calculation (min) 33 min    Equipment Utilized During Treatment Gait belt    Activity Tolerance Patient tolerated treatment well;No increased pain    Behavior During Therapy WFL for tasks assessed/performed                  Past Medical History:  Diagnosis Date   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   Anxiety    Arthritis    lower back, hands   Cancer (HCC)    melanoma, shoulder   Chronic kidney disease    Closed fracture of distal lateral malleolus of left ankle 12/28/2017   Depression    GERD (gastroesophageal reflux disease)    Hyperlipidemia    Hypertension    Postoperative bile leak    Sleep apnea    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    Past Surgical History:  Procedure Laterality Date   ANKLE FRACTURE SURGERY Left    APPENDECTOMY  2010   BACK SURGERY     disectomy lumbar, scar tissue   BREAST CYST EXCISION Right 1970   axilla   buninectomy Bilateral    CATARACT EXTRACTION W/PHACO Left 04/12/2018   Procedure: CATARACT EXTRACTION PHACO AND INTRAOCULAR LENS PLACEMENT ([place])  LEFT TORIC LENS;  Surgeon: Leandrew Koyanagi, MD;  Location: [facility];  Service: Ophthalmology;  Laterality: Left;   CATARACT EXTRACTION W/PHACO Right 05/10/2018   Procedure: CATARACT EXTRACTION PHACO AND INTRAOCULAR LENS PLACEMENT ([place])  RIGHT TORIC LENS;  Surgeon: Leandrew Koyanagi, MD;  Location: [facility];  Service: Ophthalmology;  Laterality: Right;  DIABETIC   CHOLECYSTECTOMY N/A  07/05/2017   Procedure: LAPAROSCOPIC CHOLECYSTECTOMY WITH INTRAOPERATIVE CHOLANGIOGRAM;  Surgeon: Robert Bellow, MD;  Location: [facility];  Service: General;  Laterality: N/A;   COLONOSCOPY     2009 and color gaurd in 2018   COLONOSCOPY N/A 03/25/2020   Procedure: COLONOSCOPY;  Surgeon: [name], MD;  Location: [facility];  Service: Endoscopy;  Laterality: N/A;   DRUG INDUCED ENDOSCOPY N/A 05/06/2021   Procedure: DRUG INDUCED SLEEP ENDOSCOPY;  Surgeon: Melida Quitter, MD;  Location: [facility];  Service: ENT;  Laterality: N/A;   ERCP N/A 07/12/2017   Procedure: ENDOSCOPIC RETROGRADE CHOLANGIOPANCREATOGRAPHY (ERCP);  Surgeon: Lucilla Lame, MD;  Location: [facility];  Service: Endoscopy;  Laterality: N/A;   ERCP N/A 10/04/2017   Procedure: ENDOSCOPIC RETROGRADE CHOLANGIOPANCREATOGRAPHY (ERCP);  Surgeon: Lucilla Lame, MD;  Location: [facility];  Service: Endoscopy;  Laterality: N/A;   FOOT SURGERY     x 2   INCISIONAL HERNIA REPAIR N/A 12/04/2020   Procedure: VENTRAL INCISIONAL HERNIA REPAIR WITH MESH;  Surgeon: Armandina Gemma, MD;  Location: [facility];  Service: General;  Laterality: N/A;   MELANOMA EXCISION Left 2000   PALATOPLASTY N/A 04/08/2015   Procedure: PALATOPLASTY;  Surgeon: Beverly Gust, MD;  Location: [facility];  Service: ENT;  Laterality: N/A;   TONSILLECTOMY N/A 04/08/2015  Procedure: TONSILLECTOMY;  Surgeon: Beverly Gust, MD;  Location: [facility];  Service: ENT;  Laterality: N/A;   TUBAL LIGATION     Patient Active Problem List   Diagnosis Date Noted   Chronic pain of right ankle 11/23/2021   Overweight (BMI 25.0-29.9) 11/23/2021   Paroxysmal atrial fibrillation ([place]) 10/09/2021   Aortic atherosclerosis ([place]) 05/12/2021   History of COVID-19 05/12/2021   Positive colorectal cancer screening using Cologuard test 02/05/2020   Esophageal dysphagia 12/11/2019   Hyperlipidemia LDL goal <100 12/10/2019   Right shoulder pain 10/17/2019    Arthritis of right acromioclavicular joint 08/15/2019   Piriformis syndrome of left side 08/15/2019   OSA (obstructive sleep apnea) 05/13/2019   CKD (chronic kidney disease) stage 3, GFR 30-59 ml/min ([place]) 04/13/2018   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   S/P laparoscopic cholecystectomy 07/19/2017   Hospital discharge follow-up 07/19/2017   Acoustic neuroma ([place]) 06/12/2017   Colon cancer screening 03/08/2017   Nocturia 12/09/2016   Major depressive disorder, 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[place]) 06/05/2016   Encounter for preventive health examination 03/07/2015   Incisional hernia, without obstruction or gangrene 09/28/2014   Anxiety state 11/19/2013   Gastro-esophageal reflux disease without esophagitis 11/19/2013   Squamous cell carcinoma of skin of face 10/21/2011   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 1 fall at petting zoo tripping over a baby goat. Pt was in ED earlier in week after sudden, insidious high volume near syncopal episodes with visual changes. She has not had episodes since leaving. She had no medication changes. Pt reports she was told she was in AF and told to go see cardiologist right now and get.    PAIN:  Are you having pain? Not rated today   PERTINENT HISTORY: Pt is here for vestibular PT evaluation regarding difficulties with balance and "unable to walk straight". Pt reports having been diagnosed with an acoustic neuroma in L ear about 6 yrs ago secondary to having balance issues. Went to  physician and was sent to PT under the assumption the inner ear was the cause of the balance issues. Pt states that balance did not get any better and went back to physician and had imaging done. Per pt, scan found the neuroma and plan was to monitor it. Gets imaging each year as a result and has show no changes as of now. Has been sent to an audiologist during this time and was given hearing aids. Pt recently went to PT for ankle s/p surgery for collapsed arch in January, reports no restrictions regarding ankle for therapy and feels PT helped tremendously. During surgery, pt reports she went into A-fib and is now seeing cardiologist. Was placed on several medications, and was having episodes of dizziness and hallucinations as side effects from one of the medications. Went back to her physician and dosages have been adjusted and reports no side effects since. Pt attributes all of these events to her balance being "off". Reports that sudden head movements can bring on the "off balance" sensation, as well as bending down. Pt reports she will get  the feeling "something is off" and will notice she has veered to one side or the other. PMH is significant for: acoustic neuroma, arthritis, hx of other cancer, chronic kidney disease, anxiety, depression and HTN. See chart for additional information.   PRECAUTIONS: Fall  WEIGHT BEARING RESTRICTIONS No  FALLS: Has patient fallen in last 6 months? Yes. Number of falls 1; in January fell on way to bathroom following ankle surgery, got off balance and cut head open on corner of chest, did not go to the ER, talked to PCP about head injury and reports PCP had no concerns.  PLOF: Independent  PATIENT GOALS: "be able to walk straight"   VESTIBULAR TREATMENT: Today's treatment  Gait belt donned and CGA provided unless otherwise noted  NMR:  Goals reassessed for recertification. PT instructed pt in technique with testing and indications of test performance. Please  refer to goal section for details.  Reassessment of DVA test (previous change 10 to 6): 9 to 6, still indicates impairment.   Tandem walk over 10 meters 1x mild unsteadiness   Reviewed new additions to HEP and safety precautions with performing interventions at home.  Part of session dedicated to discussion of pt's remaining symptoms, recent doctor's appointment (unbilled).   PATIENT EDUCATION: Education details: Pt educated throughout session about proper posture and technique with exercises. Improved exercise technique, movement at target joints, use of target muscles after min to mod verbal, visual, tactile cues.   Person educated: Patient Education method: Explanation, Demonstration, and Verbal cues Education comprehension: verbalized understanding, returned demonstration, verbal cues required, and needs further education   GOALS: Goals reviewed with patient? Yes  SHORT TERM GOALS: Target date: 05/19/2022   Pt will be independent in home exercise program to improve strength/mobility for better functional independence with ADLs. Baseline: 7/18: initiate next visit; 8/9: initiated VORx1 seated; 9/8: HEP further advanced; 10/18: pt HEP advanced Goal status: IN PROGRESS   LONG TERM GOALS: Target date: 06/30/2022    Pt will increase FOTO score to at least 59 to demonstrate significant improvement in mobility and quality of life. Baseline: 7/18: 53, 9/8: 59; 10/18: 60 Goal status: MET  2.  Pt will reduce dizziness handicap inventory score to < 35, for less dizziness with ADLs and increased safety with home and community tasks. Baseline: 7/18: 48; 9/8: 24 Goal status: MET  3.  Pt will increase ABC scale score > 80% to demonstrate better functional mobility and better confidence with ADLs. Baseline: 7/18: 64%; 9/8: 71.88%, 10/18: 93.13%  Goal status: MET  4.  Pt will increase DGI score to at least 23/24 as to reduce fall risk and improve dynamic gait safety with community  ambulation.  Baseline: 7/18: deferred to next session; 8/9: revised from FGA to DGI. Pt scored 21/24 on this date; 9/8: 22/24; 10/18: 23/24 Goal status:Partially MET  5.  Pt will demonstrate ability to perform tandem gait over 10 meters with no greater than 3 errors in order to improve balance with narrow BOS gait necessary for navigating obstacles and ambulating safely in the community. Baseline: 10/18: multiple errors >8 Goal status: NEW  HOME EXERCISE PROGRAM:   Updated 10/18: Access Code: [email or web address] URL: [email or web address] Date: 03/17/2022 Prepared by: Ricard Dillon  Exercises - Tandem Walking with Counter Support  - 1 x daily - 7 x weekly - 3 sets - 10 reps - Walking with Head Rotation  - 1 x daily - 7 x weekly - 3 sets - 10 reps - discussed and wrote on  instructions to perform near support surface - Tandem Stance with Support  - 1 x daily - 7 x weekly - 3 sets - 2 reps - 30 hold   10/4: Instructed pt to increase total time VORx1 horizontal head turns to 5 minutes/day (performed in 30 sec to 1 min intervals), seated.   Updated 9/8: Access Code: [email or web address] URL: [email or web address] Date: 02/05/2022 Prepared by: Ricard Dillon  Exercises - Corner Balance Feet Apart: Eyes Open With Head Turns  - 1 x daily - 7 x weekly - 2 sets - 15 reps - Shoulder External Rotation and Scapular Retraction with Resistance  - 1 x daily - 5 x weekly - 3 sets - 10 reps  Previous: 8/25: reinforced importance of performing all of HEP, specifically VOR intervention  Added 8/18: Access Code: [email or web address] URL: [email or web address] Date: 01/15/2022 Prepared by: Ricard Dillon  Exercises - Supine Chin Tuck  - 1 x daily - 5 x weekly - 2 sets - 10 reps - 3 seconds hold  01/06/22: Seated VORx1 vertical and horizontal head turns 3 minutes total of each performed in up to 60 second bouts. Instructed to rest if dizziness reaches >2/10.   ASSESSMENT:  CLINICAL  IMPRESSION: Goals reassessed for recert. Pt making gains AEB improvement on FOTO, DGI, and achieving ABC scale balance confidence goal. This indicates improvement in functional mobility and balance. While pt making gains, she still shows impairments of postural stability when ambulating with NBOS (tandem gait) or with head turns. PT has updated HEP to address these higher-level balance deficits. PT and pt also discussed decreasing frequency to 1x/week. Pt agreeable to plan. Patient's condition has the potential to improve in response to therapy. Maximum improvement is yet to be obtained. The anticipated improvement is attainable and reasonable in a generally predictable time. The pt will benefit from further skilled PT to improve balance, dizziness, pain and mobility to decrease fall risk and increase QOL.   OBJECTIVE IMPAIRMENTS Abnormal gait, decreased activity tolerance, decreased balance, decreased coordination, decreased endurance, decreased knowledge of use of DME, decreased mobility, difficulty walking, decreased ROM, dizziness, hypomobility, impaired vision/preception, improper body mechanics, and postural dysfunction.   ACTIVITY LIMITATIONS bending, sitting, standing, squatting, stairs, transfers, dressing, locomotion level, and caring for others  PARTICIPATION LIMITATIONS: cleaning, laundry, interpersonal relationship, shopping, community activity, and yard work  PERSONAL FACTORS Age, Past/current experiences, Sex, Time since onset of injury/illness/exacerbation, and 3+ comorbidities: acoustic neuroma, arthritis, chronic kidney disease, anxiety, depression, hx of other cancer, and HTN  are also affecting patient's functional outcome.   REHAB POTENTIAL: Good  CLINICAL DECISION MAKING: Evolving/moderate complexity  EVALUATION COMPLEXITY: Moderate   PLAN: PT FREQUENCY: 1x/week  PT DURATION: 12 weeks  PLANNED INTERVENTIONS: Therapeutic exercises, Therapeutic activity, Neuromuscular  re-education, Balance training, Gait training, Patient/Family education, Self Care, Joint mobilization, Stair training, Vestibular training, Canalith repositioning, Visual/preceptual remediation/compensation, DME instructions, Dry Needling, Electrical stimulation, Spinal mobilization, Cryotherapy, Moist heat, Compression bandaging, Taping, Manual therapy, and Re-evaluation  PLAN FOR NEXT SESSION: strength, balance, manual therapy, VOR, stairs, SLB, tandem gait continue plan  Ricard Dillon PT, DPT  04/07/2022, 4:17 PM

## 2022-04-08 ENCOUNTER — Telehealth: Payer: Self-pay

## 2022-04-08 NOTE — Telephone Encounter (Signed)
     Patient  visit on 11/1  at [facility] you been able to follow up with your primary care physician? YES  The patient was or was not able to obtain any needed medicine or equipment. YES  Are there diet recommendations that you are having difficulty following? NA  Patient expresses understanding of discharge instructions and education provided has no other needs at this time.  [place], [facility], Care Management  [phone] [address], [place], [place] [address] Phone: [phone] Email: [email or web address]

## 2022-04-10 ENCOUNTER — Other Ambulatory Visit: Payer: Self-pay | Admitting: Family

## 2022-04-11 NOTE — Progress Notes (Unsigned)
Cardiology Office Note  Date:  04/12/2022   ID:  Kathy Long, DOB 1950-06-01, MRN [id number]  PCP:  Minna Merritts, MD   Chief Complaint  Patient presents with   Follow up A-Fib.     Would like to discuss a cardiac ablation. Medications reviewed by the patient verbally.     HPI:  Kathy Long is a 71 y.o. female with a hx of  hypertension,  Hyperlipidemia Mild aortic atherosclerosis on CT scan March 2020 who presents due to paroxysmal atrial fibrillation.  Last seen by myself in clinic February 2023 Reports having recent sx of near syncope For the symptoms, seen in the emergency room March 31, 2022 Noted to be in atrial fibrillation Reported multiple episodes of dizziness, tunnel vision for several weeks Felt she was having more atrial fibrillation  Echocardiogram January 2023 Normal ejection fraction, normal left atrial size No significant MR  She has can continued flecainide 100 in the morning 50 in the evening with her metoprolol No further near syncope Still with dizziness, "off balance" Going to vestibular PT  BP at home 117/75 Elevated today  Compliant with Eliquis Reports she continues to have high burden atrial fibrillation on her watch "16% afib burden on apple watch" Feeling fluttering, sometimes worse in the morning  Previously complete did sleep studies in the past, moderate to severe apnea "Candidate for inspire", she and others not convinced this would be her best option Waiting to repeat home sleep study, has not received study device Weight down Not snoring Feels her sleeping is much improved after 30 pound weight loss  EKG personally reviewed by myself on todays visit Normal sinus rhythm rate 50 bpm no significant ST-T wave changes  Prior records reviewed foot surgery for hammertoe correction 06/05/2021  went into atrial fibrillation.   CHA2DS2-VASc score 3(age, htn, gender). Seen by cardiology Xarelto was increased up to 20 June 08, 2021  Echocardiogram ejection fraction 60 to 07%, grade 1 diastolic dysfunction, normal right heart pressures  Zio reviewed Paroxysmal atrial fibrillation noted on cardiac monitor.  26% burden,  Patient had a min HR of 42 bpm, max HR of 176 bpm, and avg HR of 70 bpm. Predominant underlying rhythm was Sinus Rhythm.   1 run of Ventricular Tachycardia occurred lasting 18 beats with a max rate of 162 bpm (avg 152 bpm).   21 Supraventricular Tachycardia runs occurred, the run with the fastest interval lasting 9 beats with a max rate of 139 bpm, the longest lasting 14 beats with an avg rate of 109 bpm.   Atrial Fibrillation occurred (26% burden), ranging from 57-176 bpm (avg of 89 bpm), the longest lasting 10 hours 21 mins with an avg rate of 84 bpm.  PMH:   has a past medical history of Acoustic neuroma ([place]), Anxiety, Arthritis, Cancer (HCC), Chronic kidney disease, Closed fracture of distal lateral malleolus of left ankle (12/28/2017), Depression, GERD (gastroesophageal reflux disease), Hyperlipidemia, Hypertension, Postoperative bile leak, Sleep apnea, and 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.  PSH:    Past Surgical History:  Procedure Laterality Date   ANKLE FRACTURE SURGERY Left    APPENDECTOMY  2010   BACK SURGERY     disectomy lumbar, scar tissue   BREAST CYST EXCISION Right 1970   axilla   buninectomy Bilateral    CATARACT EXTRACTION W/PHACO Left 04/12/2018   Procedure: CATARACT EXTRACTION PHACO AND INTRAOCULAR LENS PLACEMENT ([place])  LEFT TORIC LENS;  Surgeon: Leandrew Koyanagi, MD;  Location: [facility];  Service: Ophthalmology;  Laterality: Left;   CATARACT EXTRACTION W/PHACO Right 05/10/2018   Procedure: CATARACT EXTRACTION PHACO AND INTRAOCULAR LENS PLACEMENT ([place])  RIGHT TORIC LENS;  Surgeon: Leandrew Koyanagi, MD;  Location: [facility];  Service: Ophthalmology;  Laterality: Right;  DIABETIC   CHOLECYSTECTOMY N/A 07/05/2017   Procedure: LAPAROSCOPIC  CHOLECYSTECTOMY WITH INTRAOPERATIVE CHOLANGIOGRAM;  Surgeon: Robert Bellow, MD;  Location: [facility];  Service: General;  Laterality: N/A;   COLONOSCOPY     2009 and color gaurd in 2018   COLONOSCOPY N/A 03/25/2020   Procedure: COLONOSCOPY;  Surgeon: [name], MD;  Location: [facility];  Service: Endoscopy;  Laterality: N/A;   DRUG INDUCED ENDOSCOPY N/A 05/06/2021   Procedure: DRUG INDUCED SLEEP ENDOSCOPY;  Surgeon: Melida Quitter, MD;  Location: [facility];  Service: ENT;  Laterality: N/A;   ERCP N/A 07/12/2017   Procedure: ENDOSCOPIC RETROGRADE CHOLANGIOPANCREATOGRAPHY (ERCP);  Surgeon: Lucilla Lame, MD;  Location[facility];  Service: Endoscopy;  Laterality: N/A;   ERCP N/A 10/04/2017   Procedure: ENDOSCOPIC RETROGRADE CHOLANGIOPANCREATOGRAPHY (ERCP);  Surgeon: Lucilla Lame, MD;  Location: [facility];  Service: Endoscopy;  Laterality: N/A;   FOOT SURGERY     x 2   INCISIONAL HERNIA REPAIR N/A 12/04/2020   Procedure: VENTRAL INCISIONAL HERNIA REPAIR WITH MESH;  Surgeon: Armandina Gemma, MD;  Location: [facility];  Service: General;  Laterality: N/A;   MELANOMA EXCISION Left 2000   PALATOPLASTY N/A 04/08/2015   Procedure: PALATOPLASTY;  Surgeon: Beverly Gust, MD;  Location: [facility];  Service: ENT;  Laterality: N/A;   TONSILLECTOMY N/A 04/08/2015   Procedure: TONSILLECTOMY;  Surgeon: Beverly Gust, MD;  Location: [facility];  Service: ENT;  Laterality: N/A;   TUBAL LIGATION      Current Outpatient Medications  Medication Sig Dispense Refill   ALPRAZolam (XANAX) 0.25 MG tablet Take 1 tablet (0.25 mg total) by mouth 2 (two) times daily as needed for anxiety. 20 tablet 0   amLODipine (NORVASC) 2.5 MG tablet Take 1 tablet (2.5 mg total) by mouth daily. 90 tablet 1   apixaban (ELIQUIS) 5 MG TABS tablet Take 1 tablet (5 mg total) by mouth 2 (two) times daily. 180 tablet 3   Cholecalciferol (VITAMIN D3) 75 MCG (3000 UT) TABS Take 6,000 Units by mouth daily.      citalopram (CELEXA) 20 MG tablet TAKE 1 TABLET BY MOUTH DAILY 90 tablet 1   clobetasol (TEMOVATE) 0.05 % external solution APPLY DAILY UNTIL RASH IS CLEAR 50 mL 0   Coenzyme Q10 100 MG capsule Take 100 mg by mouth daily.     flecainide (TAMBOCOR) 100 MG tablet Take 100 mg (1 tablet) in the morning and 50 mg (1/2 tablet) in the evening. 126 tablet 3   melatonin 5 MG TABS Take 5 mg by mouth at bedtime.     metoprolol succinate (TOPROL-XL) 50 MG 24 hr tablet Take 1 tablet (50 mg total) by mouth daily. 90 tablet 3   MOUNJARO 7.5 MG/0.5ML Pen INJECT 7.'5MG'$  INTO THE SKIN ONCE A WEEK AS DIRECTED 2 mL 0   omeprazole (PRILOSEC) 40 MG capsule TAKE 1 CAPSULE BY MOUTH ONCE DAILY 90 capsule 1   ondansetron (ZOFRAN) 4 MG tablet TAKE 1 TABLET BY MOUTH EVERY 8 HOURS AS NEEDED FOR NAUSEA AND VOMITING 20 tablet 0   rosuvastatin (CRESTOR) 10 MG tablet TAKE ONE TABLET BY MOUTH EVERY DAY 90 tablet 1   tiZANidine (ZANAFLEX) 4 MG tablet Take 1 tablet (4 mg total)  by mouth every 6 (six) hours as needed for muscle spasms. 30 tablet 2   benzonatate (TESSALON) 100 MG capsule Take 2 capsules (200 mg total) by mouth 3 (three) times daily as needed for cough. (Patient not taking: Reported on 04/12/2022) 30 capsule 0   UNABLE TO FIND Take 1 tablet by mouth daily. Med Name: Alysia Penna Fruit and Veggie Vitamin (Patient not taking: Reported on 04/12/2022)     No current facility-administered medications for this visit.    Allergies:   Morphine and related, Tramadol, and Tape   Social History:  The patient  reports that she has never smoked. She has never used smokeless tobacco. She reports current alcohol use. She reports that she does not use drugs.   Family History:   family history includes Cancer (age of onset: 55) in her sister.    Review of Systems: Review of Systems  Constitutional: Negative.   HENT: Negative.    Respiratory: Negative.    Cardiovascular: Negative.   Gastrointestinal: Negative.    Musculoskeletal: Negative.   Neurological: Negative.   Psychiatric/Behavioral: Negative.    All other systems reviewed and are negative.  PHYSICAL EXAM: VS:  BP (!) 170/90 (BP Location: Left Arm, Patient Position: Sitting, Cuff Size: Normal)   Pulse (!) 52   Ht '5\' 2"'$  (1.575 m)   Wt 152 lb 6 oz (69.1 kg)   SpO2 98%   BMI 27.87 kg/m  , BMI Body mass index is 27.87 kg/m. Constitutional:  oriented to person, place, and time. No distress.  HENT:  Head: Grossly normal Eyes:  no discharge. No scleral icterus.  Neck: No JVD, no carotid bruits  Cardiovascular: Regular rate and rhythm, no murmurs appreciated Pulmonary/Chest: Clear to auscultation bilaterally, no wheezes or rails Abdominal: Soft.  no distension.  no tenderness.  Musculoskeletal: Normal range of motion Neurological:  normal muscle tone. Coordination normal. No atrophy Skin: Skin warm and dry Psychiatric: normal affect, pleasant  Recent Labs: 03/31/2022: ALT 24; BUN 22; Creatinine, Ser 1.12; Hemoglobin 13.5; Magnesium 2.0; Platelets 244; Potassium 4.3; Sodium 141    Lipid Panel Lab Results  Component Value Date   CHOL 140 10/09/2021   HDL 48 (L) 10/09/2021   LDLCALC 72 10/09/2021   TRIG 117 10/09/2021      Wt Readings from Last 3 Encounters:  04/12/22 152 lb 6 oz (69.1 kg)  03/31/22 143 lb (64.9 kg)  02/15/22 146 lb 6.4 oz (66.4 kg)     ASSESSMENT AND PLAN:  Problem List Items Addressed This Visit       Cardiology Problems   Aortic atherosclerosis (HCC)   Relevant Orders   EKG 12-Lead   Paroxysmal atrial fibrillation ([place]) - Primary   Relevant Orders   EKG 12-Lead   Ambulatory referral to Cardiac Electrophysiology   Other Visit Diagnoses     Primary hypertension       Relevant Orders   EKG 12-Lead   Pure hypercholesterolemia       Relevant Orders   EKG 12-Lead   Encounter for monitoring flecainide therapy         Paroxysmal atrial fibrillation Continues to have high burden atrial  fibrillation on her Apple Watch and is symptomatic Continued breakthrough despite flecainide 100 in the morning 50 in the evening, she did not tolerate 100 twice daily secondary to nighttime hallucinations She is also been to taking metoprolol succinate at 50 mg daily  Did not tolerate CPAP, weight is down 30 pounds Referral to EP for consideration  of ablation Could consider alternate antiarrhythmic such as Tikosyn, this was discussed with her  Essential hypertension Blood pressure markedly elevated on today's visit, typically well controlled at home less than 159 systolic Recommend she closely monitor blood pressure at home and call us if this continues to run high  Hyperlipidemia Reasonable cholesterol On Crestor 10 daily    Total encounter time more than 30 minutes  Greater than 50% was spent in counseling and coordination of care with the patient    Signed, Esmond Plants, M.D., Ph.D. [place], [place]

## 2022-04-12 ENCOUNTER — Ambulatory Visit: Payer: Medicare PPO

## 2022-04-12 ENCOUNTER — Ambulatory Visit: Payer: Medicare PPO | Attending: Cardiovascular Disease | Admitting: Cardiovascular Disease

## 2022-04-12 ENCOUNTER — Encounter: Payer: Self-pay | Admitting: Cardiovascular Disease

## 2022-04-12 VITALS — BP 140/70 | HR 52 | Ht 62.0 in | Wt 152.4 lb

## 2022-04-12 DIAGNOSIS — Z5181 Encounter for therapeutic drug level monitoring: Secondary | ICD-10-CM | POA: Diagnosis not present

## 2022-04-12 DIAGNOSIS — Z79899 Other long term (current) drug therapy: Secondary | ICD-10-CM | POA: Diagnosis not present

## 2022-04-12 DIAGNOSIS — I10 Essential (primary) hypertension: Secondary | ICD-10-CM

## 2022-04-12 DIAGNOSIS — I700 Atherosclerosis of aorta: Secondary | ICD-10-CM

## 2022-04-12 DIAGNOSIS — I480 Paroxysmal atrial fibrillation: Secondary | ICD-10-CM | POA: Diagnosis not present

## 2022-04-12 DIAGNOSIS — E7800 Pure hypercholesterolemia, unspecified: Secondary | ICD-10-CM | POA: Diagnosis not present

## 2022-04-12 NOTE — Patient Instructions (Addendum)
Medication Instructions:  - No changes  Ok to take a flecainide vacation and see if balance gets better  If you need a refill on your cardiac medications before your next appointment, please call your pharmacy.    Lab work: - No new labs needed   Testing/Procedures: - You have been referred to: Dr. Lars Mage For consideration of A-fib ablation/ further management of your Atrial Fibrillation    Follow-Up: At [facility], you and your health needs are our priority.  As part of our continuing mission to provide you with exceptional heart care, we have created designated Provider Care Teams.  These Care Teams include your primary Cardiologist (physician) and Advanced Practice Providers (APPs -  Physician Assistants and Nurse Practitioners) who all work together to provide you with the care you need, when you need it.  You will need a follow up appointment in 6 months  Providers on your designated Care Team:   Murray Hodgkins, NP Christell Faith, PA-C Cadence Kathlen Mody, [place]  COVID-19 Vaccine Information can be found at: [email or web address] For questions related to vaccine distribution or appointments, please email [email or web address] or call [phone].

## 2022-04-13 ENCOUNTER — Encounter: Payer: Self-pay | Admitting: Cardiovascular Disease

## 2022-04-14 ENCOUNTER — Ambulatory Visit: Payer: Medicare PPO

## 2022-04-15 ENCOUNTER — Ambulatory Visit: Payer: Medicare PPO

## 2022-04-15 DIAGNOSIS — R42 Dizziness and giddiness: Secondary | ICD-10-CM | POA: Diagnosis not present

## 2022-04-15 DIAGNOSIS — R2681 Unsteadiness on feet: Secondary | ICD-10-CM

## 2022-04-15 DIAGNOSIS — R262 Difficulty in walking, not elsewhere classified: Secondary | ICD-10-CM | POA: Diagnosis not present

## 2022-04-15 NOTE — Therapy (Signed)
OUTPATIENT PHYSICAL THERAPY TREATMENT/DISCHARGE     Patient Name: Kathy Long MRN: [id number] DOB:06-13-1950, 71 y.o., female 68 Date: 04/15/2022  PCP: [name], MD REFERRING PROVIDER: [name], MD  PT End of Session - 04/15/22 1502     Visit Number 19    Number of Visits 29    Date for PT Re-Evaluation 06/09/22    Authorization Type [employer]    Authorization Time Period 03/17/22-06/09/21    PT Start Time 1433    PT Stop Time 1457    PT Time Calculation (min) 24 min    Equipment Utilized During Treatment Gait belt    Activity Tolerance Patient tolerated treatment well;No increased pain    Behavior During Therapy WFL for tasks assessed/performed                  Past Medical History:  Diagnosis Date   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   Anxiety    Arthritis    lower back, hands   Cancer (HCC)    melanoma, shoulder   Chronic kidney disease    Closed fracture of distal lateral malleolus of left ankle 12/28/2017   Depression    GERD (gastroesophageal reflux disease)    Hyperlipidemia    Hypertension    Postoperative bile leak    Sleep apnea    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    Past Surgical History:  Procedure Laterality Date   ANKLE FRACTURE SURGERY Left    APPENDECTOMY  2010   BACK SURGERY     disectomy lumbar, scar tissue   BREAST CYST EXCISION Right 1970   axilla   buninectomy Bilateral    CATARACT EXTRACTION W/PHACO Left 04/12/2018   Procedure: CATARACT EXTRACTION PHACO AND INTRAOCULAR LENS PLACEMENT ([place])  LEFT TORIC LENS;  Surgeon: Leandrew Koyanagi, MD;  Location: [facility];  Service: Ophthalmology;  Laterality: Left;   CATARACT EXTRACTION W/PHACO Right 05/10/2018   Procedure: CATARACT EXTRACTION PHACO AND INTRAOCULAR LENS PLACEMENT ([place])  RIGHT TORIC LENS;  Surgeon: Leandrew Koyanagi, MD;  Location: [facility];  Service: Ophthalmology;  Laterality: Right;  DIABETIC   CHOLECYSTECTOMY N/A  07/05/2017   Procedure: LAPAROSCOPIC CHOLECYSTECTOMY WITH INTRAOPERATIVE CHOLANGIOGRAM;  Surgeon: Robert Bellow, MD;  Location: [facility];  Service: General;  Laterality: N/A;   COLONOSCOPY     2009 and color gaurd in 2018   COLONOSCOPY N/A 03/25/2020   Procedure: COLONOSCOPY;  Surgeon: [name], MD;  Location: [facility];  Service: Endoscopy;  Laterality: N/A;   DRUG INDUCED ENDOSCOPY N/A 05/06/2021   Procedure: DRUG INDUCED SLEEP ENDOSCOPY;  Surgeon: Melida Quitter, MD;  Location: [place];  Service: ENT;  Laterality: N/A;   ERCP N/A 07/12/2017   Procedure: ENDOSCOPIC RETROGRADE CHOLANGIOPANCREATOGRAPHY (ERCP);  Surgeon: Lucilla Lame, MD;  Location: [facility];  Service: Endoscopy;  Laterality: N/A;   ERCP N/A 10/04/2017   Procedure: ENDOSCOPIC RETROGRADE CHOLANGIOPANCREATOGRAPHY (ERCP);  Surgeon: Lucilla Lame, MD;  Location: [facility];  Service: Endoscopy;  Laterality: N/A;   FOOT SURGERY     x 2   INCISIONAL HERNIA REPAIR N/A 12/04/2020   Procedure: VENTRAL INCISIONAL HERNIA REPAIR WITH MESH;  Surgeon: Armandina Gemma, MD;  Location: [facility];  Service: General;  Laterality: N/A;   MELANOMA EXCISION Left 2000   PALATOPLASTY N/A 04/08/2015   Procedure: PALATOPLASTY;  Surgeon: Beverly Gust, MD;  Location: [facility];  Service: ENT;  Laterality: N/A;   TONSILLECTOMY N/A 04/08/2015  Procedure: TONSILLECTOMY;  Surgeon: Beverly Gust, MD;  Location: [facility];  Service: ENT;  Laterality: N/A;   TUBAL LIGATION     Patient Active Problem List   Diagnosis Date Noted   Chronic pain of right ankle 11/23/2021   Overweight (BMI 25.0-29.9) 11/23/2021   Paroxysmal atrial fibrillation ([place]) 10/09/2021   Aortic atherosclerosis ([place]) 05/12/2021   History of COVID-19 05/12/2021   Positive colorectal cancer screening using Cologuard test 02/05/2020   Esophageal dysphagia 12/11/2019   Hyperlipidemia LDL goal <100 12/10/2019   Right shoulder pain 10/17/2019    Arthritis of right acromioclavicular joint 08/15/2019   Piriformis syndrome of left side 08/15/2019   OSA (obstructive sleep apnea) 05/13/2019   CKD (chronic kidney disease) stage 3, GFR 30-59 ml/min ([place]) 04/13/2018   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   S/P laparoscopic cholecystectomy 07/19/2017   Hospital discharge follow-up 07/19/2017   Acoustic neuroma ([place]) 06/12/2017   Colon cancer screening 03/08/2017   Nocturia 12/09/2016   Major depressive disorder, 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[place]) 06/05/2016   Encounter for preventive health examination 03/07/2015   Incisional hernia, without obstruction or gangrene 09/28/2014   Anxiety state 11/19/2013   Gastro-esophageal reflux disease without esophagitis 11/19/2013   Squamous cell carcinoma of skin of face 10/21/2011   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 Has since come off one of her meds she suspected causes dizziness but so far has not felt a difference yet. Pt has appt for ablation consult Dec 13th. Pt also waiting for sleep study test. Pt reports she has kept having dizzy episodes "almost like blacking out" reports her physician may think this is also due to a-fib. Has still had episodes of a-fib and is monitoring this on her watch and by her doctors. Likely will have ablation in February. Would like to make today last day so she can first have procedure and determine if this resolved her symptoms. She has been doing some of her  HEP. Repots she did attend one soccer game since last appointment and felt it went well and that she didn't have too much trouble with her balance stating "I am usually terrified," and that she was pleasantly surprised how much she did not notice her balance.   PAIN:  Are you having pain? Not rated today   PERTINENT HISTORY: Pt is here for vestibular PT evaluation regarding difficulties with balance and "unable to walk straight". Pt reports having been diagnosed with an acoustic neuroma in L ear about 6 yrs ago secondary to having balance issues. Went to physician and was sent to PT under the assumption the inner ear was the cause of the balance issues. Pt states that balance did not get any better and went back to physician and had imaging done. Per pt, scan found the neuroma and plan was to monitor it. Gets imaging each year as a result and has show no changes as of now. Has been sent to an audiologist during this time and was given hearing aids. Pt recently went to  PT for ankle s/p surgery for collapsed arch in January, reports no restrictions regarding ankle for therapy and feels PT helped tremendously. During surgery, pt reports she went into A-fib and is now seeing cardiologist. Was placed on several medications, and was having episodes of dizziness and hallucinations as side effects from one of the medications. Went back to her physician and dosages have been adjusted and reports no side effects since. Pt attributes all of these events to her balance being "off". Reports that sudden head movements can bring on the "off balance" sensation, as well as bending down. Pt reports she will get the feeling "something is off" and will notice she has veered to one side or the other. PMH is significant for: acoustic neuroma, arthritis, hx of other cancer, chronic kidney disease, anxiety, depression and HTN. See chart for additional information.   PRECAUTIONS: Fall  WEIGHT BEARING RESTRICTIONS No  FALLS:  Has patient fallen in last 6 months? Yes. Number of falls 1; in January fell on way to bathroom following ankle surgery, got off balance and cut head open on corner of chest, did not go to the ER, talked to PCP about head injury and reports PCP had no concerns.  PLOF: Independent  PATIENT GOALS: "be able to walk straight"  VS assessment:  150/15mHg 52bpm 99% SpO2  supine  153/756mg 52bpm seated  *no dizziness today during screening  INTERVENTION THIS DATE 04/15/22  Retesting of goals (see below) and discussion of d/c recommendations/instructions following PT.   PATIENT EDUCATION: Education details: d/c recommendations, continuation of HEP beyond PT to maintain gains   Person educated: Patient Education method: Explanation, Demonstration, and Verbal cues Education comprehension: verbalized understanding, returned demonstration, verbal cues required, and needs further education   GOALS: Goals reviewed with patient? Yes  SHORT TERM GOALS: Target date: 05/27/2022   Pt will be independent in home exercise program to improve strength/mobility for better functional independence with ADLs. Baseline: 7/18: initiate next visit; 8/9: initiated VORx1 seated; 9/8: HEP further advanced; 10/18: pt HEP advanced; 11/16: performing some of her HEP consistently Goal status: PARTIALLY MET   LONG TERM GOALS: Target date: 07/08/2022    Pt will increase FOTO score to at least 59 to demonstrate significant improvement in mobility and quality of life. Baseline: 7/18: 53, 9/8: 59; 10/18: 60; Goal status: MET  2.  Pt will reduce dizziness handicap inventory score to < 35, for less dizziness with ADLs and increased safety with home and community tasks. Baseline: 7/18: 48; 9/8: 24 Goal status: MET  3.  Pt will increase ABC scale score > 80% to demonstrate better functional mobility and better confidence with ADLs. Baseline: 7/18: 64%; 9/8: 71.88%, 10/18: 93.13%  Goal status: MET  4.  Pt will  increase DGI score to at least 23/24 as to reduce fall risk and improve dynamic gait safety with community ambulation.  Baseline: 7/18: deferred to next session; 8/9: revised from FGA to DGI. Pt scored 21/24 on this date; 9/8: 22/24; 10/18: 23/24 Goal status:Partially MET  5.  Pt will demonstrate ability to perform tandem gait over 10 meters with no greater than 3 errors in order to improve balance with narrow BOS gait necessary for navigating obstacles and ambulating safely in the community. Baseline: 10/18: multiple errors >8 11/16: performed 2x with 2-3 errors. Goal status: Partially met   HOME EXERCISE PROGRAM: pt to continue HEP as previously given  Updated 10/18: Access Code: [email or web address] URL: [email or web address] Date: 03/17/2022 Prepared by: HaRicard DillonExercises -  Tandem Walking with Counter Support  - 1 x daily - 7 x weekly - 3 sets - 10 reps - Walking with Head Rotation  - 1 x daily - 7 x weekly - 3 sets - 10 reps - discussed and wrote on instructions to perform near support surface - Tandem Stance with Support  - 1 x daily - 7 x weekly - 3 sets - 2 reps - 30 hold   10/4: Instructed pt to increase total time VORx1 horizontal head turns to 5 minutes/day (performed in 30 sec to 1 min intervals), seated.   Updated 9/8: Access Code: [email or web address] URL: [email or web address] Date: 02/05/2022 Prepared by: Ricard Dillon  Exercises - Corner Balance Feet Apart: Eyes Open With Head Turns  - 1 x daily - 7 x weekly - 2 sets - 15 reps - Shoulder External Rotation and Scapular Retraction with Resistance  - 1 x daily - 5 x weekly - 3 sets - 10 reps  Previous: 8/25: reinforced importance of performing all of HEP, specifically VOR intervention  Added 8/18: Access Code: [email or web address] URL: [email or web address] Date: 01/15/2022 Prepared by: Ricard Dillon  Exercises - Supine Chin Tuck  - 1 x daily - 5 x weekly - 2 sets - 10 reps - 3 seconds hold  01/06/22:  Seated VORx1 vertical and horizontal head turns 3 minutes total of each performed in up to 60 second bouts. Instructed to rest if dizziness reaches >2/10.   ASSESSMENT:  CLINICAL IMPRESSION: Pt has met 2 remaining therapy goals. Feels she is ready for discharge and to wait and see what happens to her symptoms after upcoming ablation procedure early next year. PT and pt discuss discharge recommendations and that pt should continue HEP beyond therapy to maintain gains of therapy. The pt does not require further skilled vestibular rehab at this time and is to be discharged from PT. PT does recommend pt seek future referral should her symptoms worsen again.   OBJECTIVE IMPAIRMENTS Abnormal gait, decreased activity tolerance, decreased balance, decreased coordination, decreased endurance, decreased knowledge of use of DME, decreased mobility, difficulty walking, decreased ROM, dizziness, hypomobility, impaired vision/preception, improper body mechanics, and postural dysfunction.   ACTIVITY LIMITATIONS bending, sitting, standing, squatting, stairs, transfers, dressing, locomotion level, and caring for others  PARTICIPATION LIMITATIONS: cleaning, laundry, interpersonal relationship, shopping, community activity, and yard work  PERSONAL FACTORS Age, Past/current experiences, Sex, Time since onset of injury/illness/exacerbation, and 3+ comorbidities: acoustic neuroma, arthritis, chronic kidney disease, anxiety, depression, hx of other cancer, and HTN  are also affecting patient's functional outcome.   REHAB POTENTIAL: Good  CLINICAL DECISION MAKING: Evolving/moderate complexity  EVALUATION COMPLEXITY: Moderate   PLAN: PT FREQUENCY: 1x/week  PT DURATION: 12 weeks  PLANNED INTERVENTIONS: Therapeutic exercises, Therapeutic activity, Neuromuscular re-education, Balance training, Gait training, Patient/Family education, Self Care, Joint mobilization, Stair training, Vestibular training, Canalith  repositioning, Visual/preceptual remediation/compensation, DME instructions, Dry Needling, Electrical stimulation, Spinal mobilization, Cryotherapy, Moist heat, Compression bandaging, Taping, Manual therapy, and Re-evaluation  PLAN FOR NEXT SESSION: d/c today   3:09 PM, 04/15/22 Ricard Dillon PT, DPT   Physical Therapist - [place] [phone]     04/15/2022, 3:09 PM

## 2022-04-16 ENCOUNTER — Other Ambulatory Visit: Payer: Self-pay | Admitting: Internal Medicine

## 2022-04-19 ENCOUNTER — Ambulatory Visit: Payer: Medicare PPO

## 2022-04-21 ENCOUNTER — Ambulatory Visit: Payer: Medicare PPO

## 2022-04-21 DIAGNOSIS — C44519 Basal cell carcinoma of skin of other part of trunk: Secondary | ICD-10-CM | POA: Diagnosis not present

## 2022-04-26 ENCOUNTER — Ambulatory Visit: Payer: Medicare PPO

## 2022-04-28 ENCOUNTER — Ambulatory Visit: Payer: Medicare PPO

## 2022-04-29 ENCOUNTER — Encounter: Payer: Self-pay | Admitting: Internal Medicine

## 2022-04-29 ENCOUNTER — Other Ambulatory Visit: Payer: Self-pay | Admitting: Internal Medicine

## 2022-05-05 ENCOUNTER — Ambulatory Visit: Payer: Medicare PPO

## 2022-05-06 ENCOUNTER — Ambulatory Visit: Payer: Medicare PPO

## 2022-05-10 ENCOUNTER — Ambulatory Visit: Payer: Medicare PPO

## 2022-05-11 NOTE — Telephone Encounter (Signed)
MyChart messgae sent to patient. 

## 2022-05-12 ENCOUNTER — Ambulatory Visit: Payer: Medicare PPO | Attending: Cardiology | Admitting: Cardiology

## 2022-05-12 ENCOUNTER — Encounter: Payer: Self-pay | Admitting: Cardiology

## 2022-05-12 VITALS — BP 132/76 | HR 53 | Ht 62.0 in | Wt 147.0 lb

## 2022-05-12 DIAGNOSIS — R443 Hallucinations, unspecified: Secondary | ICD-10-CM

## 2022-05-12 DIAGNOSIS — I10 Essential (primary) hypertension: Secondary | ICD-10-CM | POA: Diagnosis not present

## 2022-05-12 DIAGNOSIS — I480 Paroxysmal atrial fibrillation: Secondary | ICD-10-CM | POA: Diagnosis not present

## 2022-05-12 NOTE — Telephone Encounter (Signed)
Error

## 2022-05-12 NOTE — Progress Notes (Signed)
Electrophysiology Office Note:    Date:  05/12/2022   ID:  Kathy Long, DOB May 17, 1951, MRN [id number]  PCP:  Minna Merritts, MD  [facility] Cardiologist:  None  [facility] Electrophysiologist:  Vickie Epley, MD   Referring MD: Minna Merritts, MD   Chief Complaint: Paroxysmal atrial fibrillation  History of Present Illness:    Kathy Long is a 71 y.o. female who presents for an evaluation of paroxysmal atrial fibrillation at the request of Dr. Rockey Situ. Their medical history includes hypertension, hyperlipidemia.  She was seen in the emergency department November 1 with near syncope associated with atrial fibrillation episodes.  She saw Dr. Rockey Situ in follow-up on April 09, 2022.  She wears an Visual merchandiser which is demonstrated a burden of atrial fibrillation of at least 16%.  Episodes seem to be worse in the morning.  She takes Xarelto for stroke prophylaxis.  She is referred to discuss catheter ablation.  She tells me her A-fib started in January.  No prior episodes.  They happen a few times per week.  She has stopped taking flecainide because it made her feel dizzy.  She is also experiencing occasional visual disturbances/hallucinations.  She describes a hallucination of an ape/man in her bedroom.  The hallucination occurs usually at night.  She knows that it is not real.  No changes in memory that she is aware of.  No auditory hallucinations.  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  She does have a history of moderate to severe sleep apnea but she is recently lost about 40 pounds and apparently her nighttime breathing abnormalities have resolved according to her husband.    Past Medical History:  Diagnosis Date   Acoustic neuroma [facility]    left ear   Anxiety    Arthritis    lower back, hands   Cancer (HCC)    melanoma, shoulder   Chronic kidney disease    Closed fracture of distal lateral malleolus of left ankle 12/28/2017   Depression    GERD  (gastroesophageal reflux disease)    Hyperlipidemia    Hypertension    Postoperative bile leak    Sleep apnea    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     Past Surgical History:  Procedure Laterality Date   ANKLE FRACTURE SURGERY Left    APPENDECTOMY  2010   BACK SURGERY     disectomy lumbar, scar tissue   BREAST CYST EXCISION Right 1970   axilla   buninectomy Bilateral    CATARACT EXTRACTION W/PHACO Left 04/12/2018   Procedure: CATARACT EXTRACTION PHACO AND INTRAOCULAR LENS PLACEMENT ([place])  LEFT TORIC LENS;  Surgeon: Leandrew Koyanagi, MD;  Location: [place];  Service: Ophthalmology;  Laterality: Left;   CATARACT EXTRACTION W/PHACO Right 05/10/2018   Procedure: CATARACT EXTRACTION PHACO AND INTRAOCULAR LENS PLACEMENT ([place])  RIGHT TORIC LENS;  Surgeon: Leandrew Koyanagi, MD;  Location: [place];  Service: Ophthalmology;  Laterality: Right;  DIABETIC   CHOLECYSTECTOMY N/A 07/05/2017   Procedure: LAPAROSCOPIC CHOLECYSTECTOMY WITH INTRAOPERATIVE CHOLANGIOGRAM;  Surgeon: Robert Bellow, MD;  Location: [facility];  Service: General;  Laterality: N/A;   COLONOSCOPY     2009 and color gaurd in 2018   COLONOSCOPY N/A 03/25/2020   Procedure: COLONOSCOPY;  Surgeon: [name], MD;  Location: [facility];  Service: Endoscopy;  Laterality: N/A;   DRUG INDUCED ENDOSCOPY N/A 05/06/2021   Procedure: DRUG INDUCED SLEEP ENDOSCOPY;  Surgeon: Melida Quitter, MD;  Location: [place];  Service: ENT;  Laterality: N/A;   ERCP N/A 07/12/2017   Procedure: ENDOSCOPIC RETROGRADE CHOLANGIOPANCREATOGRAPHY (ERCP);  Surgeon: Lucilla Lame, MD;  Location: [facility]COPY;  Service: Endoscopy;  Laterality: N/A;   ERCP N/A 10/04/2017   Procedure: ENDOSCOPIC RETROGRADE CHOLANGIOPANCREATOGRAPHY (ERCP);  Surgeon: Lucilla Lame, MD;  Location: [facility];  Service: Endoscopy;  Laterality: N/A;   FOOT SURGERY     x 2   INCISIONAL HERNIA REPAIR N/A 12/04/2020   Procedure:  VENTRAL INCISIONAL HERNIA REPAIR WITH MESH;  Surgeon: Armandina Gemma, MD;  Location: [facility];  Service: General;  Laterality: N/A;   MELANOMA EXCISION Left 2000   PALATOPLASTY N/A 04/08/2015   Procedure: PALATOPLASTY;  Surgeon: Beverly Gust, MD;  Location: [facility];  Service: ENT;  Laterality: N/A;   TONSILLECTOMY N/A 04/08/2015   Procedure: TONSILLECTOMY;  Surgeon: Beverly Gust, MD;  Location: [facility];  Service: ENT;  Laterality: N/A;   TUBAL LIGATION      Current Medications: Current Meds  Medication Sig   ALPRAZolam (XANAX) 0.25 MG tablet Take 1 tablet (0.25 mg total) by mouth 2 (two) times daily as needed for anxiety.   amLODipine (NORVASC) 2.5 MG tablet TAKE ONE TABLET (2.5 MG) BY MOUTH EVERY DAY   apixaban (ELIQUIS) 5 MG TABS tablet Take 1 tablet (5 mg total) by mouth 2 (two) times daily.   benzonatate (TESSALON) 100 MG capsule Take 2 capsules (200 mg total) by mouth 3 (three) times daily as needed for cough.   Cholecalciferol (VITAMIN D3) 75 MCG (3000 UT) TABS Take 6,000 Units by mouth daily.   citalopram (CELEXA) 20 MG tablet TAKE 1 TABLET BY MOUTH DAILY   clobetasol (TEMOVATE) 0.05 % external solution APPLY DAILY UNTIL RASH IS CLEAR   Coenzyme Q10 100 MG capsule Take 100 mg by mouth daily.   melatonin 5 MG TABS Take 5 mg by mouth at bedtime.   metoprolol succinate (TOPROL-XL) 50 MG 24 hr tablet Take 1 tablet (50 mg total) by mouth daily.   MOUNJARO 7.5 MG/0.5ML Pen INJECT 7.'5MG'$  INTO THE SKIN ONCE A WEEK AS DIRECTED   omeprazole (PRILOSEC) 40 MG capsule TAKE 1 CAPSULE BY MOUTH ONCE DAILY   ondansetron (ZOFRAN) 4 MG tablet TAKE 1 TABLET BY MOUTH EVERY 8 HOURS AS NEEDED FOR NAUSEA AND VOMITING   rosuvastatin (CRESTOR) 10 MG tablet TAKE ONE TABLET BY MOUTH EVERY DAY   tiZANidine (ZANAFLEX) 4 MG tablet Take 1 tablet (4 mg total) by mouth every 6 (six) hours as needed for muscle spasms.     Allergies:   Morphine and related, Tramadol, and Tape   Social History   Socioeconomic  History   Marital status: Married    Spouse name: Not on file   Number of children: Not on file   Years of education: 56 , PhD   Highest education level: Not on file  Occupational History   Occupation: Optometrist  Tobacco Use   Smoking status: Never   Smokeless tobacco: Never   Tobacco comments:    smoked socially in college  Vaping Use   Vaping Use: Never used  Substance and Sexual Activity   Alcohol use: Yes    Alcohol/week: 0.0 standard drinks of alcohol    Comment: occ - may have 1 glass wine/month   Drug use: No   Sexual activity: Not on file  Other Topics Concern   Not on file  Social History Narrative   Not on file   Social Determinants of  Health   Financial Resource Strain: Not on file  Food Insecurity: Not on file  Transportation Needs: Not on file  Physical Activity: Not on file  Stress: Not on file  Social Connections: Not on file     Family History: The patient's family history includes Cancer (age of onset: 62) in her sister. There is no history of Breast cancer.  ROS:   Please see the history of present illness.    All other systems reviewed and are negative.  EKGs/Labs/Other Studies Reviewed:    The following studies were reviewed today:  January 2023 echo EF 60 RV normal No MR  January 2023 ZIO monitor 26% burden of atrial fibrillation     Recent Labs: 03/31/2022: ALT 24; BUN 22; Creatinine, Ser 1.12; Hemoglobin 13.5; Magnesium 2.0; Platelets 244; Potassium 4.3; Sodium 141  Recent Lipid Panel    Component Value Date/Time   CHOL 140 10/09/2021 1454   TRIG 117 10/09/2021 1454   HDL 48 (L) 10/09/2021 1454   CHOLHDL 2.9 10/09/2021 1454   VLDL 29.8 12/10/2019 1439   LDLCALC 72 10/09/2021 1454    Physical Exam:    VS:  BP 132/76   Pulse (!) 53   Ht '5\' 2"'$  (1.575 m)   Wt 147 lb (66.7 kg)   SpO2 97%   BMI 26.89 kg/m     Wt Readings from Last 3 Encounters:  05/12/22 147 lb (66.7 kg)  04/12/22 152 lb 6 oz (69.1 kg)  03/31/22 143  lb (64.9 kg)     GEN:  Well nourished, well developed in no acute distress HEENT: Normal NECK: No JVD; No carotid bruits LYMPHATICS: No lymphadenopathy CARDIAC: RRR, no murmurs, rubs, gallops RESPIRATORY:  Clear to auscultation without rales, wheezing or rhonchi  ABDOMEN: Soft, non-tender, non-distended MUSCULOSKELETAL:  No edema; No deformity  SKIN: Warm and dry NEUROLOGIC:  Alert and oriented x 3 PSYCHIATRIC:  Normal affect       ASSESSMENT:    1. Paroxysmal atrial fibrillation (HCC)   2. Primary hypertension   3. Hallucination    PLAN:    In order of problems listed above:  #Paroxysmal atrial fibrillation Symptomatic. Did not tolerate flecainide.  Now takes Toprol XL once daily.   On Eliquis for stroke prophylaxis I discussed treatment options including alternative antiarrhythmic drugs and catheter ablation.  I do think she is a good candidate for catheter ablation.  Discussed treatment options today for their AF including antiarrhythmic drug therapy and ablation. Discussed risks, recovery and likelihood of success. Discussed potential need for repeat ablation procedures and antiarrhythmic drugs after an initial ablation. They wish to proceed with scheduling.  Risk, benefits, and alternatives to EP study and radiofrequency ablation for afib were also discussed in detail today. These risks include but are not limited to stroke, bleeding, vascular damage, tamponade, perforation, damage to the esophagus, lungs, and other structures, pulmonary vein stenosis, worsening renal function, and death. The patient understands these risk and wishes to proceed.  We will therefore proceed with catheter ablation at the next available time.  Carto, ICE, anesthesia are requested for the procedure.  Will also obtain CT PV protocol prior to the procedure to exclude LAA thrombus and further evaluate atrial anatomy.  #Visual hallucinations I would like these to be evaluated in parallel with her  atrial fibrillation.  Unclear what could be causing these-medication side effect versus dementia variant versus related to his sleep disturbance?  I will put a referral in for a neurologist to further evaluate.  #  Hypertension At goal today.  Recommend checking blood pressures 1-2 times per week at home and recording the values.  Recommend bringing these recordings to the primary care physician.     Medication Adjustments/Labs and Tests Ordered: Current medicines are reviewed at length with the patient today.  Concerns regarding medicines are outlined above.  Orders Placed This Encounter  Procedures   CT CARDIAC MORPH/PULM VEIN W/CM&W/O CA SCORE   Basic metabolic panel   CBC with Differential/Platelet   Ambulatory referral to Neurology   No orders of the defined types were placed in this encounter.    Signed, Hilton Cork. Quentin Ore, MD, [facility], [facility] 05/12/2022 11:24 AM    Electrophysiology [facility]

## 2022-05-12 NOTE — Patient Instructions (Signed)
Medication Instructions:  Your physician recommends that you continue on your current medications as directed. Please refer to the Current Medication list given to you today.  *If you need a refill on your cardiac medications before your next appointment, please call your pharmacy*  Lab Work: BMET and CBC - prior to CT scan and ablation (see instruction letter) If you have labs (blood work) drawn today and your tests are completely normal, you will receive your results only by: [place] (if you have MyChart) OR A paper copy in the mail If you have any lab test that is abnormal or we need to change your treatment, we will call you to review the results.  Testing/Procedures: Your physician has requested that you have cardiac CT. Cardiac computed tomography (CT) is a painless test that uses an x-ray machine to take clear, detailed pictures of your heart. For further information please visit HugeFiesta.tn. Please follow instruction sheet as given.  Your physician has recommended that you have an ablation. Catheter ablation is a medical procedure used to treat some cardiac arrhythmias (irregular heartbeats). During catheter ablation, a long, thin, flexible tube is put into a blood vessel in your groin (upper thigh), or neck. This tube is called an ablation catheter. It is then guided to your heart through the blood vessel. Radio frequency waves destroy small areas of heart tissue where abnormal heartbeats may cause an arrhythmia to start. Please see the instruction sheet given to you today.  Follow-Up: At [facility], you and your health needs are our priority.  As part of our continuing mission to provide you with exceptional heart care, we have created designated Provider Care Teams.  These Care Teams include your primary Cardiologist (physician) and Advanced Practice Providers (APPs -  Physician Assistants and Nurse Practitioners) who all work together to provide you with the  care you need, when you need it.  Your next appointment:   We will contact you to arrange your follow ups.   Important Information About Sugar

## 2022-05-13 ENCOUNTER — Ambulatory Visit: Payer: Medicare PPO

## 2022-05-13 ENCOUNTER — Telehealth: Payer: Self-pay

## 2022-05-13 NOTE — Telephone Encounter (Signed)
Work up complete...  Pt is aware of date/time and Instruction letters sent via MyChart and pt Is aware of how to find them.

## 2022-05-17 ENCOUNTER — Ambulatory Visit: Payer: Medicare PPO

## 2022-05-20 ENCOUNTER — Ambulatory Visit: Payer: Medicare PPO

## 2022-05-21 ENCOUNTER — Other Ambulatory Visit: Payer: Self-pay | Admitting: Cardiovascular Disease

## 2022-05-21 ENCOUNTER — Other Ambulatory Visit: Payer: Self-pay | Admitting: Internal Medicine

## 2022-05-21 NOTE — Telephone Encounter (Signed)
Prescription refill request for Eliquis received. Indication:afib Last office visit:12/23 Scr:1.1 Age: 71 Weight:66.7  kg  Prescription refilled

## 2022-05-21 NOTE — Telephone Encounter (Signed)
Refill request

## 2022-05-26 ENCOUNTER — Ambulatory Visit: Payer: Medicare PPO | Admitting: Physician Assistant

## 2022-05-26 ENCOUNTER — Encounter: Payer: Self-pay | Admitting: Physician Assistant

## 2022-05-26 ENCOUNTER — Ambulatory Visit: Payer: Medicare PPO

## 2022-05-26 ENCOUNTER — Other Ambulatory Visit (INDEPENDENT_AMBULATORY_CARE_PROVIDER_SITE_OTHER): Payer: Medicare PPO

## 2022-05-26 VITALS — BP 127/50 | HR 100 | Resp 18 | Ht 62.0 in | Wt 149.0 lb

## 2022-05-26 DIAGNOSIS — R413 Other amnesia: Secondary | ICD-10-CM

## 2022-05-26 NOTE — Patient Instructions (Signed)
It was a pleasure to see you today at our office.   Recommendations:  Follow up in 1  months MRI brain  Neurocognitive testing to further evaluate cognitive concerns and determine other underlying cause of memory changes, including potential contribution from sleep, anxiety, or depression  Check B12, TSH Recommend good control of cardiovascular risk factors,       Whom to call:  Memory  decline, memory medications: Call our office [phone]   For psychiatric meds, mood meds: Please have your primary care physician manage these medications.   Counseling regarding caregiver distress, including caregiver depression, anxiety and issues regarding community resources, adult day care programs, adult living facilities, or memory care questions:   Feel free to contact [place], Social Worker at [phone]   For assessment of decision of mental capacity and competency:  Call Dr. Anthoney Harada, geriatric psychiatrist at [phone]  For guidance in geriatric dementia issues please call [facility] [phone]  For guidance regarding [facility] and if placement were needed at the facility, contact Arnell Asal, Social Worker tel: [phone]  If you have any severe symptoms of a stroke, or other severe issues such as confusion,severe chills or fever, etc call 911 or go to the ER as you may need to be evaluated further   Feel free to visit Facebook page " [employer]" for tips of how to care for people with memory problems.    Feel free to go to the following database for funded clinical studies conducted around the world: http://saunders.com/   https://www.triadclinicaltrials.com/     RECOMMENDATIONS FOR ALL PATIENTS WITH MEMORY PROBLEMS: 1. Continue to exercise (Recommend 30 minutes of walking everyday, or 3 hours every week) 2. Increase social interactions - continue going to [place] and enjoy social gatherings with friends and  family 3. Eat healthy, avoid fried foods and eat more fruits and vegetables 4. Maintain adequate blood pressure, blood sugar, and blood cholesterol level. Reducing the risk of stroke and cardiovascular disease also helps promoting better memory. 5. Avoid stressful situations. Live a simple life and avoid aggravations. Organize your time and prepare for the next day in anticipation. 6. Sleep well, avoid any interruptions of sleep and avoid any distractions in the bedroom that may interfere with adequate sleep quality 7. Avoid sugar, avoid sweets as there is a strong link between excessive sugar intake, diabetes, and cognitive impairment We discussed the Mediterranean diet, which has been shown to help patients reduce the risk of progressive memory disorders and reduces cardiovascular risk. This includes eating fish, eat fruits and green leafy vegetables, nuts like almonds and hazelnuts, walnuts, and also use olive oil. Avoid fast foods and fried foods as much as possible. Avoid sweets and sugar as sugar use has been linked to worsening of memory function.  There is always a concern of gradual progression of memory problems. If this is the case, then we may need to adjust level of care according to patient needs. Support, both to the patient and caregiver, should then be put into place.    FALL PRECAUTIONS: Be cautious when walking. Scan the area for obstacles that may increase the risk of trips and falls. When getting up in the mornings, sit up at the edge of the bed for a few minutes before getting out of bed. Consider elevating the bed at the head end to avoid drop of blood pressure when getting up. Walk always in a well-lit room (use night lights in the walls). Avoid  area rugs or power cords from appliances in the middle of the walkways. Use a walker or a cane if necessary and consider physical therapy for balance exercise. Get your eyesight checked regularly.  FINANCIAL OVERSIGHT: Supervision,  especially oversight when making financial decisions or transactions is also recommended.  HOME SAFETY: Consider the safety of the kitchen when operating appliances like stoves, microwave oven, and blender. Consider having supervision and share cooking responsibilities until no longer able to participate in those. Accidents with firearms and other hazards in the house should be identified and addressed as well.   ABILITY TO BE LEFT ALONE: If patient is unable to contact 911 operator, consider using LifeLine, or when the need is there, arrange for someone to stay with patients. Smoking is a fire hazard, consider supervision or cessation. Risk of wandering should be assessed by caregiver and if detected at any point, supervision and safe proof recommendations should be instituted.  MEDICATION SUPERVISION: Inability to self-administer medication needs to be constantly addressed. Implement a mechanism to ensure safe administration of the medications.   DRIVING: Regarding driving, in patients with progressive memory problems, driving will be impaired. We advise to have someone else do the driving if trouble finding directions or if minor accidents are reported. Independent driving assessment is available to determine safety of driving.   If you are interested in the driving assessment, you can contact the following:  [employer] in [place]  [place] [phone]  [place]  [facility] [phone] or [phone]

## 2022-05-26 NOTE — Progress Notes (Addendum)
Assessment/Plan:   Kathy Long is a very pleasant 71 y.o. year old RH female with  a history of hypertension, hyperlipidemia, OSA currently not on CPAP, PAF on Eliquis, seen today for evaluation of visual hallucinations. Personally reviewed MRI of the brain 11/27/2021 remarkable for moderate chronic small vessel ischemia, no significant interval change in the left internal auditory canal extending into the left cerebellopontine angle cistern (13 x 5 x 5 mm) consistent with a schwannoma without mass effect. Neurological exam normal, MoCA score 24/30. Etiology of visual hallucinations unclear at this time. Hypnopompic hallucinations may occur with certain sleep disorders, proceed with repeat sleep study. Neurodegenerative conditions may also cause hallucinations, Neurocognitive testing will be ordered to further evaluate symptoms.  Memory Impairment with visual hallucinations, etiology unclear  Neurocognitive testing to further evaluate cognitive concerns and determine other underlying cause of memory changes, including potential contribution from sleep, anxiety, or depression and diagnostic clarity Check B12, TSH MRI brain for evaluation of structural abnormalities and vascular load Recommend good control of cardiovascular risk factors, patient is on Eliquis, follow-up with cardiology Mood  control as per PCP. Consider replacing omeprazole for another GERD medication, as this drug may contribute to memory changes if taken greater than 3 years. Patient is scheduled for home sleep study in the future for further evaluation of OSA. Folllow up in 1 month to discuss the MRI of the brain results and further plans   Subjective:   The patient is here alone, prior records were reviewed    How long did patient have hallucinations?  "Over the years after taking oxycodone for 3 days I was experiencing hallucinations.  Around January of 2023 I had surgery.  I developed atrial fibrillation, and  cardiology evaluated me.  They placed me on flecainide.  Hallucinations began again.  In February, I woke up, and they were "drones on the ceiling ".  Around March, after balancing flecainide to lower dose, the frequency of hallucinations decreased but they never went away "..   She describes the hallucinations as "very real, instantaneous, come and go ".  "For example, one time I was in my bedroom, and I thought I saw my daughter at the door telling me "hi mom ", but my husband told me that there was nobody there ".  "Another time, a zombie looking man was at the side of the bed.  Another time I saw a man between the headboard and my head and then it was gone  ".  Over the last 2 months, she reports this hallucinations to be infrequent.  In addition, the patient reports  having been diagnosed with OSA 3 years ago, and  sleep study revealed moderate to severe disease, tried CPAP, but was not able to tolerate it, when it was starting to leak fluid.  She tried 3 different face masks to no avail.  She has been referred to pulmonary for possible "Inspire ".  She is not quite sure that she will proceed with it.  After losing 40 pounds, she is sleeping better, and she is snoring less, set for another sleep study in a few weeks..  The patient is supposed to have an ablation for atrial fibrillation but this procedure will not be taking place unless she has a neurological evaluation as stated by the Dr. Quentin Ore, EPS.  For now, she is scheduled around March of next year.  He wants in neurologist to "set things straight, tried to determine any causes of this hallucinations ".  Memory difficulties "not the same as it was ", she notices that her friend had to help her to remember I recommend that she is to go frequently in [place] and ended up finishing her sentence.  However, she denies forgetting recent conversations or people that she knows, although may be harder to remember the names of people that she meets for the  first time.  repeats oneself?  Denies  Disoriented when walking into a room?  Patient denies except occasionally not remembering what patient came to the room for    Leaving objects in unusual places?  Patient denies   Wandering behavior?  Patient denies   Any personality changes since last visit?  Patient denies   Any worsening depression?:  Denies. She takes Celexa, never had psychotherapy  paranoia?  Denies Seizures?   Patient denies    Any sleep changes?  As mentioned above, the patient has a history of OSA, currently not on CPAP.  She also reports recurring dreams about not being prepared for her work, or not having taken a course that she needed for graduate school, and doing the drain she feels frantic about it.  Denies REM behavior or sleepwalking.    Any hygiene concerns?  Patient denies   Independent of bathing and dressing?  Endorsed  Does the patient needs help with medications?  Patient is in charge Who is in charge of the finances?  Patient is in charge Any changes in appetite?  Denies Patient have trouble swallowing? Patient denies   Does the patient cook?  Any kitchen accidents such as leaving the stove on? Patient denies   Any headaches?  Patient denies   Chronic back pain Patient denies   Ambulates with difficulty?   Patient denies   Recent falls or head injuries?  Patient denies     Unilateral weakness, numbness or tingling?  Patient denies   Any tremors?  Patient denies   Any anosmia?  Patient denies   Any incontinence of urine?  Patient denies   Any bowel dysfunction?   Patient denies      Patient lives  with husband  History of heavy alcohol intake?  Patient denies   History of heavy tobacco use?  Patient denies   Family history of dementia?  3 Uncles w dementia of unknown type, she has one uncle w PD  Denies any issues driving She reports having 3 degrees, has been a Pharmacist, hospital and a school principal    Personally reviewed MRI of the brain 11/27/2021 remarkable  for moderate chronic small vessel ischemia, no significant interval change in the left internal auditory canal extending into the left cerebellopontine angle cistern (13 x 5 x 5 mm) consistent with a schwannoma without mass effect.   Personally reviewed CT of the head without contrast November 2023 remarkable for mild chronic white matter ischemic changes, as well as a known left vestibular schwannoma.  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   Anxiety    Arthritis    lower back, hands   Cancer (HCC)    melanoma, shoulder   Chronic kidney disease    Closed fracture of distal lateral malleolus of left ankle 12/28/2017   Depression    GERD (gastroesophageal reflux disease)    Hyperlipidemia    Hypertension    Postoperative bile leak    Sleep apnea    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      Past Surgical History:  Procedure Laterality Date   ANKLE FRACTURE SURGERY Left    APPENDECTOMY  2010   BACK SURGERY     disectomy lumbar, scar tissue   BREAST CYST EXCISION Right 1970   axilla   buninectomy Bilateral    CATARACT EXTRACTION W/PHACO Left 04/12/2018   Procedure: CATARACT EXTRACTION PHACO AND INTRAOCULAR LENS PLACEMENT ([place])  LEFT TORIC LENS;  Surgeon: Leandrew Koyanagi, MD;  Location: [place];  Service: Ophthalmology;  Laterality: Left;   CATARACT EXTRACTION W/PHACO Right 05/10/2018   Procedure: CATARACT EXTRACTION PHACO AND INTRAOCULAR LENS PLACEMENT ([place])  RIGHT TORIC LENS;  Surgeon: Leandrew Koyanagi, MD;  Location: [place];  Service: Ophthalmology;  Laterality: Right;  DIABETIC   CHOLECYSTECTOMY N/A 07/05/2017   Procedure: LAPAROSCOPIC CHOLECYSTECTOMY WITH INTRAOPERATIVE CHOLANGIOGRAM;  Surgeon: Robert Bellow, MD;  Location: [facility];  Service: General;  Laterality: N/A;   COLONOSCOPY     2009 and color gaurd in 2018   COLONOSCOPY N/A 03/25/2020   Procedure: COLONOSCOPY;  Surgeon: [name], MD;   Location: [facility];  Service: Endoscopy;  Laterality: N/A;   DRUG INDUCED ENDOSCOPY N/A 05/06/2021   Procedure: DRUG INDUCED SLEEP ENDOSCOPY;  Surgeon: Melida Quitter, MD;  Location: [place];  Service: ENT;  Laterality: N/A;   ERCP N/A 07/12/2017   Procedure: ENDOSCOPIC RETROGRADE CHOLANGIOPANCREATOGRAPHY (ERCP);  Surgeon: Lucilla Lame, MD;  Location: [facility] END[facility]COPY;  Service: Endoscopy;  Laterality: N/A;   ERCP N/A 10/04/2017   Procedure: ENDOSCOPIC RETROGRADE CHOLANGIOPANCREATOGRAPHY (ERCP);  Surgeon: Lucilla Lame, MD;  Location: [facility] END[facility]COPY;  Service: Endoscopy;  Laterality: N/A;   FOOT SURGERY     x 2   INCISIONAL HERNIA REPAIR N/A 12/04/2020   Procedure: VENTRAL INCISIONAL HERNIA REPAIR WITH MESH;  Surgeon: Armandina Gemma, MD;  Location: [facility];  Service: General;  Laterality: N/A;   MELANOMA EXCISION Left 2000   PALATOPLASTY N/A 04/08/2015   Procedure: PALATOPLASTY;  Surgeon: Beverly Gust, MD;  Location: [facility];  Service: ENT;  Laterality: N/A;   TONSILLECTOMY N/A 04/08/2015   Procedure: TONSILLECTOMY;  Surgeon: Beverly Gust, MD;  Location: [facility];  Service: ENT;  Laterality: N/A;   TUBAL LIGATION       Allergies  Allergen Reactions   Morphine And Related Other (See Comments)    Chest pain   Tramadol Other (See Comments)    tremors   Tape Rash         Current Outpatient Medications  Medication Instructions   ALPRAZolam (XANAX) 0.25 mg, Oral, 2 times daily PRN   amLODipine (NORVASC) 2.5 MG tablet TAKE ONE TABLET (2.5 MG) BY MOUTH EVERY DAY   benzonatate (TESSALON) 200 mg, Oral, 3 times daily PRN   citalopram (CELEXA) 20 MG tablet TAKE 1 TABLET BY MOUTH DAILY   clobetasol (TEMOVATE) 0.05 % external solution APPLY DAILY UNTIL RASH IS CLEAR   Coenzyme Q10 100 mg, Oral, Daily   Eliquis 5 mg, Oral, 2 times daily   melatonin 10 mg, Oral, Daily at bedtime   metoprolol succinate (TOPROL-XL) 50 mg, Oral, Daily   MOUNJARO 7.5 MG/0.5ML Pen INJECT 7.'5MG'$   INTO THE SKIN ONCE A WEEK AS DIRECTED   omeprazole (PRILOSEC) 40 MG capsule TAKE 1 CAPSULE BY MOUTH ONCE DAILY   ondansetron (ZOFRAN) 4 MG tablet TAKE 1 TABLET BY MOUTH EVERY 8 HOURS AS NEEDED FOR NAUSEA AND VOMITING   rosuvastatin (CRESTOR) 10 MG tablet TAKE ONE TABLET BY MOUTH EVERY DAY   Vitamin D3 6,000 Units, Oral, Daily  VITALS:   Vitals:   05/26/22 1334  BP: (!) 127/50  Pulse: 100  Resp: 18  SpO2: 98%  Weight: 149 lb (67.6 kg)  Height: '5\' 2"'$  (1.575 m)     PHYSICAL EXAM   HEENT:  Normocephalic, atraumatic. The mucous membranes are moist. The superficial temporal arteries are without ropiness or tenderness. Cardiovascular: Regular rate and rhythm. Lungs: Clear to auscultation bilaterally. Neck: There are no carotid bruits noted bilaterally.  NEUROLOGICAL:    05/26/2022    7:00 PM  Montreal Cognitive Assessment   Visuospatial/ Executive (0/5) 3  Naming (0/3) 3  Attention: Read list of digits (0/2) 2  Attention: Read list of letters (0/1) 1  Attention: Serial 7 subtraction starting at 100 (0/3) 3  Language: Repeat phrase (0/2) 1  Language : Fluency (0/1) 1  Abstraction (0/2) 0  Delayed Recall (0/5) 4  Orientation (0/6) 6  Total 24  Adjusted Score (based on education) 24        No data to display           Orientation:  Alert and oriented to person, place and time. No aphasia or dysarthria. Fund of knowledge is appropriate. Recent memory impaired and remote memory intact.  Attention and concentration are normal.  Able to name objects and repeat phrases. Delayed recall 4/5 Cranial nerves: There is good facial symmetry. Extraocular muscles are intact and visual fields are full to confrontational testing. Speech is fluent and clear. No tongue deviation. Hearing is intact to conversational tone. Tone: Tone is good throughout. Sensation: Sensation is intact to light touch and pinprick throughout. Vibration is intact at the bilateral big toe.There is no  extinction with double simultaneous stimulation. There is no sensory dermatomal level identified. Coordination: The patient has no difficulty with RAM's or FNF bilaterally. Normal finger to nose  Motor: Strength is 5/5 in the bilateral upper and lower extremities. There is no pronator drift. There are no fasciculations noted. DTR's: Deep tendon reflexes are 2/4 at the bilateral biceps, triceps, brachioradialis, patella and achilles.  Plantar responses are downgoing bilaterally. Gait and Station: The patient is able to ambulate without difficulty.The patient is able to heel toe walk without any difficulty.The patient is able to ambulate in a tandem fashion. The patient is able to stand in the Romberg position.     Thank you for allowing [place] the opportunity to participate in the care of this nice patient. Please do not hesitate to contact us for any questions or concerns.   Total time spent on today's visit was 73 minutes dedicated to this patient today, preparing to see patient, examining the patient, ordering tests and/or medications and counseling the patient, documenting clinical information in the EHR or other health record, independently interpreting results and communicating results to the patient/family, discussing treatment and goals, answering patient's questions and coordinating care.  Cc:  Minna Merritts, MD  Sharene Butters 05/26/2022 7:25 PM

## 2022-05-28 LAB — VITAMIN B12: Vitamin B-12: 562 pg/mL (ref 211–911)

## 2022-05-28 LAB — TSH: TSH: 1.25 u[IU]/mL (ref 0.35–5.50)

## 2022-05-31 ENCOUNTER — Encounter: Payer: Self-pay | Admitting: Physician Assistant

## 2022-06-02 ENCOUNTER — Ambulatory Visit: Payer: Medicare PPO

## 2022-06-02 MED ORDER — TIRZEPATIDE 5 MG/0.5ML ~~LOC~~ SOAJ: 5.0000 mg | SUBCUTANEOUS | 2 refills | Status: DC

## 2022-06-04 ENCOUNTER — Ambulatory Visit: Payer: Medicare PPO

## 2022-06-04 DIAGNOSIS — G4733 Obstructive sleep apnea (adult) (pediatric): Secondary | ICD-10-CM

## 2022-06-05 ENCOUNTER — Encounter: Payer: Self-pay | Admitting: Physician Assistant

## 2022-06-07 ENCOUNTER — Ambulatory Visit: Payer: Medicare PPO

## 2022-06-07 DIAGNOSIS — G4733 Obstructive sleep apnea (adult) (pediatric): Secondary | ICD-10-CM | POA: Diagnosis not present

## 2022-06-09 ENCOUNTER — Ambulatory Visit: Payer: Medicare PPO | Admitting: Podiatry

## 2022-06-09 ENCOUNTER — Ambulatory Visit: Payer: Medicare PPO

## 2022-06-09 ENCOUNTER — Encounter: Payer: Self-pay | Admitting: Internal Medicine

## 2022-06-15 ENCOUNTER — Encounter: Payer: Self-pay | Admitting: Cardiology

## 2022-06-15 ENCOUNTER — Ambulatory Visit: Payer: Medicare PPO

## 2022-06-17 ENCOUNTER — Other Ambulatory Visit: Payer: Medicare PPO

## 2022-06-17 ENCOUNTER — Ambulatory Visit: Payer: Medicare PPO

## 2022-06-21 ENCOUNTER — Other Ambulatory Visit: Payer: Self-pay | Admitting: Internal Medicine

## 2022-06-21 ENCOUNTER — Other Ambulatory Visit (HOSPITAL_COMMUNITY): Payer: Self-pay

## 2022-06-21 ENCOUNTER — Telehealth: Payer: Self-pay

## 2022-06-21 DIAGNOSIS — Z1231 Encounter for screening mammogram for malignant neoplasm of breast: Secondary | ICD-10-CM

## 2022-06-21 NOTE — Telephone Encounter (Signed)
Pharmacy Patient Advocate Encounter   Received notification from [place] that prior authorization for Mounjaro '5MG'$ /0.5ML pen-injectors is required/requested.  Per Test Claim: Diagnosis code needed   PA submitted on 06/21/22 to (ins) [employer] via CoverMyMeds Key [id number]  Status is pending

## 2022-06-22 ENCOUNTER — Other Ambulatory Visit (HOSPITAL_COMMUNITY): Payer: Self-pay

## 2022-06-22 NOTE — Telephone Encounter (Signed)
Pharmacy Patient Advocate Encounter  Received notification from [facility] that the request for prior authorization for Mounjaro '5MG'$ /0.5ML pen-injectors has been denied due to Drugs used to help lose weight are excluded for Medicare Part D coverage    E-Appeal is available (Key: [id number])  Please be advised we currently do not have a Pharmacist to review denials. If you would like [place] to submit it on your behalf, please provide clinical information to support your reason for appeal and any pertinent information you would like [place] to include with the appeal request. Appeals may take longer 5 business days to be submitted as we prepares necessary documentation. Thanks for your support.  How would you like to proceed?

## 2022-06-22 NOTE — Telephone Encounter (Signed)
Cost for Zepbound

## 2022-06-22 NOTE — Telephone Encounter (Signed)
Dr. Derrel Nip would like to know if her insurance will cover Zepbound?

## 2022-06-22 NOTE — Telephone Encounter (Signed)
Ran test claim, came back: Processed as drug discount. Excluded drug. Copay $1,[phone]    Per denial, Drugs used to help lose weight are excluded for Medicare Part D coverage

## 2022-06-23 NOTE — Telephone Encounter (Signed)
Sent pt a mychart message. 

## 2022-06-28 ENCOUNTER — Ambulatory Visit (INDEPENDENT_AMBULATORY_CARE_PROVIDER_SITE_OTHER): Payer: Medicare PPO

## 2022-06-28 ENCOUNTER — Ambulatory Visit: Payer: Medicare PPO | Admitting: Podiatry

## 2022-06-28 ENCOUNTER — Telehealth: Payer: Self-pay

## 2022-06-28 DIAGNOSIS — M19279 Secondary osteoarthritis, unspecified ankle and foot: Secondary | ICD-10-CM | POA: Diagnosis not present

## 2022-06-28 DIAGNOSIS — M2041 Other hammer toe(s) (acquired), right foot: Secondary | ICD-10-CM | POA: Diagnosis not present

## 2022-06-28 NOTE — Telephone Encounter (Signed)
Tammy, please advise. Thanks 

## 2022-06-28 NOTE — Progress Notes (Signed)
  Subjective:  Patient ID: Kathy Long, female    DOB: May 06, 1951,  MRN: [id number]  Chief Complaint  Patient presents with   Routine Post Op    POV  DOS 06/05/2020 FUSION OF SUBTARLAR & TALONAVICULAR JOINTS, BONE GRAFT FROM LEG  RT FOOT, PT AND FDL TENODESIS      72 y.o. female returns for post-op check.  Doing well not having any pain she used the bone stimulator   Review of Systems: Negative except as noted in the HPI. Denies N/V/F/Ch.   Objective:  There were no vitals filed for this visit. There is no height or weight on file to calculate BMI. Constitutional Well developed. Well nourished.  Vascular Foot warm and well perfused. Capillary refill normal to all digits.  Calf is soft and supple, no posterior calf or knee pain, negative Homans' sign  Neurologic Normal speech. Oriented to person, place, and time. Epicritic sensation to light touch grossly present bilaterally.  Dermatologic Incisions are well-healed not hypertrophic  Orthopedic: No pain.  No notable edema.  Reducible hammertoe contracture of 3rd toe adductovarus   Multiple view plain film radiographs: New films taken today show maintained correction and good consolidation across the posterior facet of the subtalar joint, decrease in the radiolucency around the talonavicular joint again with good fusion noted   IMPRESSION: 1. Posterior subtalar arthrodesis transfixed with 2 cannulated screws and osseous bridging across the joint space. 2. Talonavicular arthrodesis transfixed with a medial and lateral stable and areas of bone bridging. Small area of persistent joint space without bone bridging along the dorsal aspect. 3. Mild osteoarthritis of the calcaneocuboid joint. 4. 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 5. Subchondral cystic changes along the medial corner of the talar dome likely reflecting an osteochondral lesion.     Electronically Signed   By: Kathreen Devoid M.D.   On: 12/06/2021  07:12 Assessment:   1. Hammertoe of right foot   2. Secondary osteoarthritis of talonavicular joint     Plan:  Patient was evaluated and treated and all questions answered.  S/p foot surgery right -Has improved quite a bit and relatively pain free. We reviewed her radiographs. There is increased consolidation since using the bone stimulator. The hammertoe she says is not particularly limiting or painful currently. I recommended we continue to monitor, if it becomes limiting then we will proceed with tenotomy.   Return if symptoms worsen or fail to improve.

## 2022-06-28 NOTE — Telephone Encounter (Signed)
Kaitlyn called from [facility] to ask that we send a diagnosis code for [facility] to [facility].

## 2022-06-28 NOTE — Telephone Encounter (Signed)
Sleep study on June 04, 2022 shows moderate sleep apnea with AHI 19.5/hour and SpO2 low at 87%.  Please set up an office visit or virtual visit to discuss these results and go over treatment plan

## 2022-06-29 ENCOUNTER — Telehealth: Payer: Self-pay

## 2022-06-29 ENCOUNTER — Ambulatory Visit: Payer: Medicare PPO | Admitting: Physician Assistant

## 2022-06-29 NOTE — Telephone Encounter (Signed)
Per Clarise Cruz "we need to reschedule then, if she ewas supposed to come for revieweing the MRI and we dont have the result", left message advising cancellation of appointment and to call back to schedule another appointment sometime after her MRI.

## 2022-06-29 NOTE — Telephone Encounter (Signed)
hey this patient has an appointment this afternoon and wondered if Kathy Long would like to still see her as this appt was supposed to be f/u after MRI and she is scheduled for 2/6 for the MRI

## 2022-06-30 DIAGNOSIS — L570 Actinic keratosis: Secondary | ICD-10-CM | POA: Diagnosis not present

## 2022-06-30 DIAGNOSIS — D485 Neoplasm of uncertain behavior of skin: Secondary | ICD-10-CM | POA: Diagnosis not present

## 2022-06-30 MED ORDER — TIRZEPATIDE 5 MG/0.5ML ~~LOC~~ SOAJ: 5.0000 mg | SUBCUTANEOUS | 2 refills | Status: DC

## 2022-06-30 NOTE — Telephone Encounter (Signed)
Rx has been resent with Dx code.

## 2022-07-01 ENCOUNTER — Other Ambulatory Visit: Payer: Medicare PPO

## 2022-07-06 ENCOUNTER — Ambulatory Visit
Admission: RE | Admit: 2022-07-06 | Discharge: 2022-07-06 | Disposition: A | Discharge status: Home / Self Care | Payer: Medicare PPO | Source: Ambulatory Visit | Attending: Physician Assistant | Admitting: Physician Assistant

## 2022-07-06 DIAGNOSIS — R413 Other amnesia: Secondary | ICD-10-CM | POA: Diagnosis not present

## 2022-07-06 MED ORDER — GADOPICLENOL 0.5 MMOL/ML IV SOLN
7.5000 mL | Freq: Once | INTRAVENOUS | Status: AC | PRN
  Administered 2022-07-06: 7.5 mL via INTRAVENOUS

## 2022-07-08 ENCOUNTER — Other Ambulatory Visit: Payer: Self-pay | Admitting: Internal Medicine

## 2022-07-12 ENCOUNTER — Encounter: Payer: Self-pay | Admitting: Adult Health

## 2022-07-12 ENCOUNTER — Telehealth (INDEPENDENT_AMBULATORY_CARE_PROVIDER_SITE_OTHER): Payer: Medicare PPO | Admitting: Adult Health

## 2022-07-12 DIAGNOSIS — G4733 Obstructive sleep apnea (adult) (pediatric): Secondary | ICD-10-CM | POA: Diagnosis not present

## 2022-07-12 NOTE — Progress Notes (Signed)
Has had 4 [place] vaccines.  No flu shot.

## 2022-07-12 NOTE — Patient Instructions (Addendum)
Referral to Orthodontics for oral appliance device - Dr. Toy Cookey [phone]) Call back once you have decided on oral appliance or INSPIRE device .  Healthy sleep regimen  Do not drive if you are sleepy  Follow up with Dr. Mortimer Fries as planned and As needed

## 2022-07-12 NOTE — Progress Notes (Signed)
Virtual Visit via Video Note  I connected with Kathy Long on 07/12/22 at  9:30 AM EST by a video enabled telemedicine application and verified that I am speaking with the correct person using two identifiers.  Location: Patient: Home  Provider: Office    I discussed the limitations of evaluation and management by telemedicine and the availability of in person appointments. The patient expressed understanding and agreed to proceed.  History of Present Illness: 72 year old female followed for sleep apnea  Today's video visit is a 28-monthfollow-up.  Patient has underlying moderate obstructive sleep apnea.  Has been unable to tolerate CPAP.  She was referred to ENT for an evaluation of the inspire device.  Previously seen by Dr. BRedmond Basemanand underwent a drug-induced sleep endoscopy.  This showed 90% anterior posterior collapse making her a good candidate for the hypoglossal nerve stimulator placement.  Patient is also been working on aggressive weight loss and is down about 40 pounds.  Patient wanted to have a repeat sleep study to make sure she still had sleep apnea with her recent weight loss.  This was set up and done on June 04, 2022 that showed moderate sleep apnea with AHI at 19.5/hour and SpO2 low at 87%.  We reviewed her sleep study results in detail.  Even though she has improved with decrease number of events she still has moderate sleep apnea.  We discussed the findings in detail.  We went over treatment options including oral appliance and inspire device.  She says she just cannot wear CPAP.  She is tried it before and it is just not an option for her.  Patient wants to be evaluated to see if an oral appliance might be of benefit.  She is currently following with cardiology for A-fib and is planning an upcoming ablation.  She is also following with neurology for memory impairment.  So she wants to do some type of treatment for her underlying sleep apnea she just wants to do more research  before committing to the inspire device.  Past Medical History:  Diagnosis Date   Acoustic neuroma ([facility]    left ear   Anxiety    Arthritis    lower back, hands   Cancer (HCC)    melanoma, shoulder   Chronic kidney disease    Closed fracture of distal lateral malleolus of left ankle 12/28/2017   Depression    GERD (gastroesophageal reflux disease)    Hyperlipidemia    Hypertension    Postoperative bile leak    Sleep apnea    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     Current Outpatient Medications on File Prior to Visit  Medication Sig Dispense Refill   ALPRAZolam (XANAX) 0.25 MG tablet Take 1 tablet (0.25 mg total) by mouth 2 (two) times daily as needed for anxiety. 20 tablet 0   amLODipine (NORVASC) 2.5 MG tablet TAKE ONE TABLET (2.5 MG) BY MOUTH EVERY DAY 90 tablet 1   Cholecalciferol (VITAMIN D3) 75 MCG (3000 UT) TABS Take 6,000 Units by mouth daily.     citalopram (CELEXA) 20 MG tablet TAKE 1 TABLET BY MOUTH DAILY 90 tablet 1   clobetasol (TEMOVATE) 0.05 % external solution APPLY DAILY UNTIL RASH IS CLEAR 50 mL 0   Coenzyme Q10 100 MG capsule Take 100 mg by mouth daily.     ELIQUIS 5 MG TABS tablet TAKE 1 TABLET BY MOUTH TWICE DAILY 180 tablet 3   metoprolol succinate (TOPROL-XL) 50 MG 24 hr  tablet TAKE 1 TABLET BY MOUTH DAILY 90 tablet 1   ondansetron (ZOFRAN) 4 MG tablet TAKE 1 TABLET BY MOUTH EVERY 8 HOURS AS NEEDED FOR NAUSEA AND VOMITING 20 tablet 0   OVER THE COUNTER MEDICATION Take 20 mg by mouth in the morning and at bedtime. Acid controller ([facility] brand)     OVER THE COUNTER MEDICATION Take 1 tablet by mouth as needed. Vitamin shop (snooze right)     rosuvastatin (CRESTOR) 10 MG tablet TAKE ONE TABLET BY MOUTH EVERY DAY 90 tablet 1   tirzepatide (MOUNJARO) 5 MG/0.5ML Pen Inject 5 mg into the skin once a week. 6 mL 2   benzonatate (TESSALON) 100 MG capsule Take 2 capsules (200 mg total) by mouth 3 (three) times daily as needed for cough. (Patient not taking: Reported on  05/26/2022) 30 capsule 0   No current facility-administered medications on file prior to visit.       Observations/Objective: Sleep study on June 04, 2022 shows moderate sleep apnea with AHI 19.5/hour and SpO2 low at 87%.   Sleep study May 10, 2019 showed moderate sleep apnea with AHI 28.6/hour    Assessment and Plan: OSA -moderate sleep apnea persist.  We discussed her new study in detail.  Went over treatment options including oral appliance and inspire device.  Patient wants a referral to local orthodontist to look see if she is a candidate and cost analysis as well.  Patient also is still considering the inspire device.  She says that she will call us back when she is made a decision.  Plan  Patient Instructions  Referral to Orthodontics for oral appliance device - Dr. Toy Cookey [phone]) Call back once you have decided on oral appliance or INSPIRE device .  Healthy sleep regimen  Do not drive if you are sleepy  Follow up with Dr. Mortimer Fries as planned and As needed      Follow Up Instructions:    I discussed the assessment and treatment plan with the patient. The patient was provided an opportunity to ask questions and all were answered. The patient agreed with the plan and demonstrated an understanding of the instructions.   The patient was advised to call back or seek an in-person evaluation if the symptoms worsen or if the condition fails to improve as anticipated.  I provided 22  minutes of non-face-to-face time during this encounter.   Rexene Edison, NP

## 2022-07-13 ENCOUNTER — Other Ambulatory Visit: Payer: Self-pay | Admitting: Internal Medicine

## 2022-07-21 ENCOUNTER — Other Ambulatory Visit: Payer: Self-pay | Admitting: Internal Medicine

## 2022-07-21 MED ORDER — BENZONATATE 100 MG PO CAPS: 200.0000 mg | ORAL_CAPSULE | Freq: Three times a day (TID) | ORAL | 0 refills | Status: DC | PRN

## 2022-07-21 NOTE — Telephone Encounter (Signed)
   Patient comment: I asked at [facility] at least a week ago to get this refilled. It still hasn't been refilled. I have a cold and  a pretty severe cough. I hope this will be refilled right away. Thanks!      Is it okay to refill?

## 2022-07-23 ENCOUNTER — Ambulatory Visit: Payer: Medicare PPO | Admitting: Physician Assistant

## 2022-07-27 ENCOUNTER — Other Ambulatory Visit: Payer: Self-pay

## 2022-07-27 MED ORDER — CITALOPRAM HYDROBROMIDE 20 MG PO TABS: 20.0000 mg | ORAL_TABLET | Freq: Every day | ORAL | 1 refills | Status: DC

## 2022-07-29 ENCOUNTER — Other Ambulatory Visit (HOSPITAL_COMMUNITY): Payer: Self-pay

## 2022-07-29 ENCOUNTER — Ambulatory Visit: Payer: Medicare PPO | Admitting: Sports Medicine

## 2022-07-29 ENCOUNTER — Ambulatory Visit (INDEPENDENT_AMBULATORY_CARE_PROVIDER_SITE_OTHER): Payer: Medicare PPO

## 2022-07-29 VITALS — HR 42 | Ht 62.0 in | Wt 149.0 lb

## 2022-07-29 DIAGNOSIS — G8929 Other chronic pain: Secondary | ICD-10-CM | POA: Diagnosis not present

## 2022-07-29 DIAGNOSIS — M1712 Unilateral primary osteoarthritis, left knee: Secondary | ICD-10-CM | POA: Diagnosis not present

## 2022-07-29 DIAGNOSIS — M25562 Pain in left knee: Secondary | ICD-10-CM | POA: Diagnosis not present

## 2022-07-29 NOTE — Patient Instructions (Addendum)
Good to see you Knee HEP 3-4 week follow up

## 2022-07-29 NOTE — Progress Notes (Signed)
Kathy Long D.[place] Phone: [phone]   Assessment and Plan:     1. Chronic pain of left knee 2. Primary osteoarthritis of left knee  -Chronic with exacerbation, initial sports medicine visit - Most consistent with intra-articular pathology such as flare of osteoarthritis versus flare of meniscal pathology based on HPI and physical exam and x-ray imaging - X-ray obtained in clinic.  My interpretation: No acute fracture or dislocation.  Cortical changes most prominent along patella with mild tricompartmental degenerative changes. - Patient elected for intra-articular CSI.  Tolerated well per note below.  Procedure: Knee Joint Injection Side: Left Indication: Flare of knee pain  Risks explained and consent was given verbally. The site was cleaned with alcohol prep. A needle was introduced with an anterio-lateral approach. Injection given using 72m of 1% lidocaine without epinephrine and 168mof kenalog '40mg'$ /ml. This was well tolerated and resulted in symptomatic relief.  Needle was removed, hemostasis achieved, and post injection instructions were explained.   Pt was advised to call or return to clinic if these symptoms worsen or fail to improve as anticipated.   Pertinent previous records reviewed include none   Follow Up: 3 to 4 weeks for reevaluation.  If no improvement or worsening of symptoms, could consider physical exam versus alternative injection versus course of anti-inflammatories   Subjective:   I, Moenique Parris, am serving as a scEducation administratoror Doctor BeGlennon MacChief Complaint: left knee pain   HPI:   07/29/22 Patient is a [age]ear old female complaining of left knee pain. Patient states that pain just started a month ago behind the knee that has been getting worst, hx of back surgery 20 years ago, decreased ROM , pain with flexion , intermittent tylenol and ibu, no radiating pain ,  antalgic gait as of today, no numbness or tingling    Relevant Historical Information: Hypertension, paroxysmal atrial fibrillation, GERD, CKD stage III  Additional pertinent review of systems negative.   Current Outpatient Medications:    ALPRAZolam (XANAX) 0.25 MG tablet, Take 1 tablet (0.25 mg total) by mouth 2 (two) times daily as needed for anxiety., Disp: 20 tablet, Rfl: 0   amLODipine (NORVASC) 2.5 MG tablet, TAKE ONE TABLET (2.5 MG) BY MOUTH EVERY DAY, Disp: 90 tablet, Rfl: 1   benzonatate (TESSALON) 100 MG capsule, Take 2 capsules (200 mg total) by mouth 3 (three) times daily as needed for cough., Disp: 30 capsule, Rfl: 0   Cholecalciferol (VITAMIN D3) 75 MCG (3000 UT) TABS, Take 6,000 Units by mouth daily., Disp: , Rfl:    citalopram (CELEXA) 20 MG tablet, Take 1 tablet (20 mg total) by mouth daily., Disp: 90 tablet, Rfl: 1   clobetasol (TEMOVATE) 0.05 % external solution, APPLY DAILY UNTIL RASH IS CLEAR, Disp: 50 mL, Rfl: 0   Coenzyme Q10 100 MG capsule, Take 100 mg by mouth daily., Disp: , Rfl:    ELIQUIS 5 MG TABS tablet, TAKE 1 TABLET BY MOUTH TWICE DAILY, Disp: 180 tablet, Rfl: 3   ondansetron (ZOFRAN) 4 MG tablet, TAKE 1 TABLET BY MOUTH EVERY 8 HOURS AS NEEDED FOR NAUSEA AND VOMITING, Disp: 20 tablet, Rfl: 0   OVER THE COUNTER MEDICATION, Take 20 mg by mouth in the morning and at bedtime. Acid controller ([facility] brand), Disp: , Rfl:    rosuvastatin (CRESTOR) 10 MG tablet, TAKE ONE TABLET BY MOUTH EVERY DAY, Disp: 90 tablet, Rfl: 1   metoprolol  succinate (TOPROL-XL) 50 MG 24 hr tablet, TAKE 1 TABLET BY MOUTH DAILY, Disp: 90 tablet, Rfl: 1   OVER THE COUNTER MEDICATION, Take 1 tablet by mouth as needed. Vitamin shop (snooze right), Disp: , Rfl:    tirzepatide (MOUNJARO) 5 MG/0.5ML Pen, Inject 5 mg into the skin once a week., Disp: 6 mL, Rfl: 2   Objective:     Vitals:   07/29/22 1519  Pulse: (!) 42  SpO2: 98%  Weight: 149 lb (67.6 kg)  Height: '5\' 2"'$  (1.575 m)       Body mass index is 27.25 kg/m.    Physical Exam:    General:  awake, alert oriented, no acute distress nontoxic Skin: no suspicious lesions or rashes Neuro:sensation intact and strength 5/5 with no deficits, no atrophy, normal muscle tone Psych: No signs of anxiety, depression or other mood disorder  Left knee: Crepitus present No swelling No deformity Neg fluid wave, joint milking ROM Flex 95, Ext 0 NTTP over the quad tendon, medial fem condyle, lat fem condyle, patella, plica, patella tendon, tibial tuberostiy, fibular head, posterior fossa, pes anserine bursa, gerdy's tubercle, medial jt line, lateral jt line Neg anterior and posterior drawer Neg lachman Neg sag sign Negative varus stress Negative valgus stress Negative McMurray Positive Thessaly  Gait normal    Electronically signed by:  Kathy Long D.[facility] 3:41 PM 07/29/22

## 2022-07-30 ENCOUNTER — Telehealth: Payer: Self-pay | Admitting: Internal Medicine

## 2022-07-30 NOTE — Telephone Encounter (Signed)
LM letting pt know that the rx has been sent in.

## 2022-07-30 NOTE — Telephone Encounter (Signed)
Prescription Request  07/30/2022   LOV: 01/14/2022  What is the name of the medication or equipment? benzonatate (TESSALON) 100 MG capsule  Have you contacted your pharmacy to request a refill? Yes   Which pharmacy would you like this sent to?   [facility] - [place], [place] - [address] [address] Phone: [phone] Fax: [phone]    Patient notified that their request is being sent to the clinical staff for review and that they should receive a response within 2 business days.   Please advise at Mobile [phone] (mobile)

## 2022-08-02 ENCOUNTER — Encounter: Payer: Self-pay | Admitting: Physician Assistant

## 2022-08-02 ENCOUNTER — Ambulatory Visit: Payer: Medicare PPO | Admitting: Physician Assistant

## 2022-08-02 VITALS — BP 120/76 | HR 78 | Ht 62.0 in | Wt 151.9 lb

## 2022-08-02 DIAGNOSIS — R413 Other amnesia: Secondary | ICD-10-CM | POA: Diagnosis not present

## 2022-08-02 NOTE — Progress Notes (Signed)
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 RH female seen today in follow up to discuss the MRI of the brain results.  She was last seen on 05/26/2022 at which time her MoCA was 24/30.  She is not on antidementia medication.  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 Since her last visit, she reports sleeping better, is undergoing OSA therapy, entertaining psychotherapy. She is also reporting no further hallucinations since discontinuing melatonin. Able to participate on her ADLs       Follow up in  6 months. Recommended control of cardiovascular risk factors Continue to control mood as per PCP Patient has an appt with Neuropsych for clarity of diagnosis and disease trajectory and to determine other causes of memory loss     Initial visit 05/26/2022  How long did patient have hallucinations?  "Over the years after taking oxycodone for 3 days I was experiencing hallucinations.  Around January of 2023 I had surgery.  I developed atrial fibrillation, and cardiology evaluated me.  They placed me on flecainide.  Hallucinations began again.  In February, I woke up, and they were "drones on the ceiling ".  Around March, after balancing flecainide to lower dose, the frequency of hallucinations decreased but they never went away "..   She describes the hallucinations as "very real, instantaneous, come and go ".  "For example, one time I was in my bedroom, and I thought I saw my daughter at the door telling me "hi mom ", but my husband told me that there was nobody there ".  "Another time, a zombie looking man was at the side of the bed.  Another time I saw a man between the headboard and my head and then it was gone  ".  Over the last 2 months, she reports this hallucinations to be  infrequent.  In addition, the patient reports  having been diagnosed with OSA 3 years ago, and  sleep study revealed moderate to severe disease, tried CPAP, but was not able to tolerate it, when it was starting to leak fluid.  She tried 3 different face masks to no avail.  She has been referred to pulmonary for possible "Inspire ".  She is not quite sure that she will proceed with it.  After losing 40 pounds, she is sleeping better, and she is snoring less, set for another sleep study in a few weeks..  The patient is supposed to have an ablation for atrial fibrillation but this procedure will not be taking place unless she has a neurological evaluation as stated by the Dr. Quentin Ore, EPS.  For now, she is scheduled around March of next year.  He wants in neurologist to "set things straight, tried to determine any causes of this hallucinations ".    Memory difficulties "not the same as it was ", she notices that her friend had to help her to remember I recommend that she is to go frequently in [place] and ended up finishing her sentence.  However, she denies forgetting recent conversations or people that she knows, although may be harder to remember the names of people that she meets for the first time.  repeats oneself?  Denies  Disoriented when walking into a room?  Patient denies except occasionally not remembering what patient came to the room for  Leaving objects in unusual places?  Patient denies   Wandering behavior?  Patient denies   Any personality changes since last visit?  Patient denies   Any worsening depression?:  Denies. She takes Celexa, never had psychotherapy  paranoia?  Denies Seizures?   Patient denies    Any sleep changes?  As mentioned above, the patient has a history of OSA, currently not on CPAP.  She also reports recurring dreams about not being prepared for her work, or not having taken a course that she needed for graduate school, and doing the drain she feels frantic about it.   Denies REM behavior or sleepwalking.    Any hygiene concerns?  Patient denies   Independent of bathing and dressing?  Endorsed  Does the patient needs help with medications?  Patient is in charge Who is in charge of the finances?  Patient is in charge Any changes in appetite?  Denies Patient have trouble swallowing? Patient denies   Does the patient cook?  Any kitchen accidents such as leaving the stove on? Patient denies   Any headaches?  Patient denies   Chronic back pain Patient denies   Ambulates with difficulty?   Patient denies   Recent falls or head injuries?  Patient denies     Unilateral weakness, numbness or tingling?  Patient denies   Any tremors?  Patient denies   Any anosmia?  Patient denies   Any incontinence of urine?  Patient denies   Any bowel dysfunction?   Patient denies      Patient lives  with husband  History of heavy alcohol intake?  Patient denies   History of heavy tobacco use?  Patient denies   Family history of dementia?  3 Uncles w dementia of unknown type, she has one uncle w PD  Denies any issues driving She reports having 3 degrees, has been a Pharmacist, hospital and a school principal      Personally reviewed MRI of the brain 11/27/2021 remarkable for moderate chronic small vessel ischemia, no significant interval change in the left internal auditory canal extending into the left cerebellopontine angle cistern (13 x 5 x 5 mm) consistent with a schwannoma without mass effect.      Personally reviewed CT of the head without contrast November 2023 remarkable for mild chronic white matter ischemic changes, as well as a known left vestibular schwannoma. MRI brain was remarkable for    CURRENT MEDICATIONS:  Outpatient Encounter Medications as of 08/02/2022  Medication Sig   ALPRAZolam (XANAX) 0.25 MG tablet Take 1 tablet (0.25 mg total) by mouth 2 (two) times daily as needed for anxiety.   amLODipine (NORVASC) 2.5 MG tablet TAKE ONE TABLET (2.5 MG) BY MOUTH EVERY DAY    benzonatate (TESSALON) 100 MG capsule Take 2 capsules (200 mg total) by mouth 3 (three) times daily as needed for cough.   Cholecalciferol (VITAMIN D3) 75 MCG (3000 UT) TABS Take 6,000 Units by mouth daily.   citalopram (CELEXA) 20 MG tablet Take 1 tablet (20 mg total) by mouth daily.   clobetasol (TEMOVATE) 0.05 % external solution APPLY DAILY UNTIL RASH IS CLEAR   Coenzyme Q10 100 MG capsule Take 100 mg by mouth daily.   ELIQUIS 5 MG TABS tablet TAKE 1 TABLET BY MOUTH TWICE DAILY   metoprolol succinate (TOPROL-XL) 50 MG 24 hr tablet TAKE 1 TABLET BY MOUTH DAILY   ondansetron (ZOFRAN) 4 MG tablet TAKE 1 TABLET BY MOUTH EVERY 8 HOURS AS NEEDED FOR NAUSEA AND VOMITING  OVER THE COUNTER MEDICATION Take 20 mg by mouth in the morning and at bedtime. Acid controller ([facility] brand)   OVER THE COUNTER MEDICATION Take 1 tablet by mouth as needed. Vitamin shop (snooze right)   rosuvastatin (CRESTOR) 10 MG tablet TAKE ONE TABLET BY MOUTH EVERY DAY   tirzepatide (MOUNJARO) 5 MG/0.5ML Pen Inject 5 mg into the skin once a week.   No facility-administered encounter medications on file as of 08/02/2022.        No data to display            05/26/2022    7:00 PM  Montreal Cognitive Assessment   Visuospatial/ Executive (0/5) 3  Naming (0/3) 3  Attention: Read list of digits (0/2) 2  Attention: Read list of letters (0/1) 1  Attention: Serial 7 subtraction starting at 100 (0/3) 3  Language: Repeat phrase (0/2) 1  Language : Fluency (0/1) 1  Abstraction (0/2) 0  Delayed Recall (0/5) 4  Orientation (0/6) 6  Total 24  Adjusted Score (based on education) 24   Thank you for allowing [place] the opportunity to participate in the care of this nice patient. Please do not hesitate to contact us for any questions or concerns.   Total time spent on today's visit was 43 minutes dedicated to this patient today, preparing to see patient, examining the patient, ordering tests and/or medications and counseling  the patient, documenting clinical information in the EHR or other health record, independently interpreting results and communicating results to the patient/family, discussing treatment and goals, answering patient's questions and coordinating care.  Cc:  [name], MD  Sharene Butters 08/02/2022 7:15 AM

## 2022-08-02 NOTE — Patient Instructions (Signed)
It was a pleasure to see you today at our office.   Recommendations:  Follow up after neurocognitive testing  Neurocognitive testing to further evaluate cognitive concerns and determine other underlying cause of memory changes, including potential contribution from sleep, anxiety, or depression       Whom to call:  Memory  decline, memory medications: Call our office [phone]   For psychiatric meds, mood meds: Please have your primary care physician manage these medications.   Counseling regarding caregiver distress, including caregiver depression, anxiety and issues regarding community resources, adult day care programs, adult living facilities, or memory care questions:   Feel free to contact [place], Social Worker at [phone]   For assessment of decision of mental capacity and competency:  Call Dr. Anthoney Harada, geriatric psychiatrist at [phone]  For guidance in geriatric dementia issues please call [facility] [phone]  For guidance regarding [facility] and if placement were needed at the facility, contact Arnell Asal, Social Worker tel: [phone]  If you have any severe symptoms of a stroke, or other severe issues such as confusion,severe chills or fever, etc call 911 or go to the ER as you may need to be evaluated further   Feel free to visit Facebook page " [employer]" for tips of how to care for people with memory problems.    Feel free to go to the following database for funded clinical studies conducted around the world: http://saunders.com/   https://www.triadclinicaltrials.com/     RECOMMENDATIONS FOR ALL PATIENTS WITH MEMORY PROBLEMS: 1. Continue to exercise (Recommend 30 minutes of walking everyday, or 3 hours every week) 2. Increase social interactions - continue going to [place] and enjoy social gatherings with friends and family 3. Eat healthy, avoid fried foods and eat more fruits and  vegetables 4. Maintain adequate blood pressure, blood sugar, and blood cholesterol level. Reducing the risk of stroke and cardiovascular disease also helps promoting better memory. 5. Avoid stressful situations. Live a simple life and avoid aggravations. Organize your time and prepare for the next day in anticipation. 6. Sleep well, avoid any interruptions of sleep and avoid any distractions in the bedroom that may interfere with adequate sleep quality 7. Avoid sugar, avoid sweets as there is a strong link between excessive sugar intake, diabetes, and cognitive impairment We discussed the Mediterranean diet, which has been shown to help patients reduce the risk of progressive memory disorders and reduces cardiovascular risk. This includes eating fish, eat fruits and green leafy vegetables, nuts like almonds and hazelnuts, walnuts, and also use olive oil. Avoid fast foods and fried foods as much as possible. Avoid sweets and sugar as sugar use has been linked to worsening of memory function.  There is always a concern of gradual progression of memory problems. If this is the case, then we may need to adjust level of care according to patient needs. Support, both to the patient and caregiver, should then be put into place.    FALL PRECAUTIONS: Be cautious when walking. Scan the area for obstacles that may increase the risk of trips and falls. When getting up in the mornings, sit up at the edge of the bed for a few minutes before getting out of bed. Consider elevating the bed at the head end to avoid drop of blood pressure when getting up. Walk always in a well-lit room (use night lights in the walls). Avoid area rugs or power cords from appliances in the middle of the walkways. Use  a walker or a cane if necessary and consider physical therapy for balance exercise. Get your eyesight checked regularly.  FINANCIAL OVERSIGHT: Supervision, especially oversight when making financial decisions or transactions is  also recommended.  HOME SAFETY: Consider the safety of the kitchen when operating appliances like stoves, microwave oven, and blender. Consider having supervision and share cooking responsibilities until no longer able to participate in those. Accidents with firearms and other hazards in the house should be identified and addressed as well.   ABILITY TO BE LEFT ALONE: If patient is unable to contact 911 operator, consider using LifeLine, or when the need is there, arrange for someone to stay with patients. Smoking is a fire hazard, consider supervision or cessation. Risk of wandering should be assessed by caregiver and if detected at any point, supervision and safe proof recommendations should be instituted.  MEDICATION SUPERVISION: Inability to self-administer medication needs to be constantly addressed. Implement a mechanism to ensure safe administration of the medications.   DRIVING: Regarding driving, in patients with progressive memory problems, driving will be impaired. We advise to have someone else do the driving if trouble finding directions or if minor accidents are reported. Independent driving assessment is available to determine safety of driving.   If you are interested in the driving assessment, you can contact the following:  [employer] in [place]  [place] [phone]  [place]  [facility] [phone] or [phone]

## 2022-08-05 ENCOUNTER — Encounter: Payer: Self-pay | Admitting: Internal Medicine

## 2022-08-06 ENCOUNTER — Ambulatory Visit
Admission: RE | Admit: 2022-08-06 | Discharge: 2022-08-06 | Disposition: A | Discharge status: Home / Self Care | Payer: Medicare PPO | Source: Ambulatory Visit | Attending: Internal Medicine | Admitting: Internal Medicine

## 2022-08-06 DIAGNOSIS — Z1231 Encounter for screening mammogram for malignant neoplasm of breast: Secondary | ICD-10-CM | POA: Insufficient documentation

## 2022-08-13 ENCOUNTER — Ambulatory Visit: Payer: Medicare PPO | Admitting: Internal Medicine

## 2022-08-13 ENCOUNTER — Encounter: Payer: Self-pay | Admitting: Internal Medicine

## 2022-08-13 VITALS — BP 118/72 | HR 59 | Temp 97.6°F | Ht 62.0 in | Wt 151.8 lb

## 2022-08-13 DIAGNOSIS — E785 Hyperlipidemia, unspecified: Secondary | ICD-10-CM | POA: Diagnosis not present

## 2022-08-13 DIAGNOSIS — G4733 Obstructive sleep apnea (adult) (pediatric): Secondary | ICD-10-CM

## 2022-08-13 DIAGNOSIS — I10 Essential (primary) hypertension: Secondary | ICD-10-CM

## 2022-08-13 DIAGNOSIS — E663 Overweight: Secondary | ICD-10-CM

## 2022-08-13 DIAGNOSIS — I480 Paroxysmal atrial fibrillation: Secondary | ICD-10-CM | POA: Diagnosis not present

## 2022-08-13 DIAGNOSIS — I700 Atherosclerosis of aorta: Secondary | ICD-10-CM | POA: Diagnosis not present

## 2022-08-13 LAB — COMPREHENSIVE METABOLIC PANEL
ALT: 26 U/L (ref 0–35)
AST: 19 U/L (ref 0–37)
Albumin: 3.9 g/dL (ref 3.5–5.2)
Alkaline Phosphatase: 99 U/L (ref 39–117)
BUN: 20 mg/dL (ref 6–23)
CO2: 28 mEq/L (ref 19–32)
Calcium: 9.3 mg/dL (ref 8.4–10.5)
Chloride: 106 mEq/L (ref 96–112)
Creatinine, Ser: 1.11 mg/dL (ref 0.40–1.20)
GFR: 49.1 mL/min — ABNORMAL LOW (ref >60.00)
Glucose, Bld: 79 mg/dL (ref 70–99)
Potassium: 4 mEq/L (ref 3.5–5.1)
Sodium: 143 mEq/L (ref 135–145)
Total Bilirubin: 1.4 mg/dL — ABNORMAL HIGH (ref 0.2–1.2)
Total Protein: 6.4 g/dL (ref 6.0–8.3)

## 2022-08-13 LAB — LIPID PANEL
Cholesterol: 178 mg/dL (ref 0–200)
HDL: 47 mg/dL (ref >39.00)
LDL Cholesterol: 105 mg/dL — ABNORMAL HIGH (ref 0–99)
NonHDL: 130.68
Total CHOL/HDL Ratio: 4
Triglycerides: 130 mg/dL (ref 0.0–149.0)
VLDL: 26 mg/dL (ref 0.0–40.0)

## 2022-08-13 LAB — LDL CHOLESTEROL, DIRECT: Direct LDL: 100 mg/dL

## 2022-08-13 MED ORDER — AMLODIPINE BESYLATE 2.5 MG PO TABS: ORAL_TABLET | ORAL | 1 refills | Status: DC

## 2022-08-13 MED ORDER — ZEPBOUND 5 MG/0.5ML ~~LOC~~ SOAJ: 5.0000 mg | SUBCUTANEOUS | 2 refills | Status: DC

## 2022-08-13 MED ORDER — ROSUVASTATIN CALCIUM 10 MG PO TABS: 10.0000 mg | ORAL_TABLET | Freq: Every day | ORAL | 1 refills | Status: DC

## 2022-08-13 MED ORDER — ALPRAZOLAM 0.25 MG PO TABS: 0.2500 mg | ORAL_TABLET | Freq: Every day | ORAL | 5 refills | Status: DC

## 2022-08-13 NOTE — Patient Instructions (Signed)
No  More benadryl (dipenhydramine)  Use alprazolam  1/2 tablet as needed for insomnia  Mounjaro is available as Zepbound for weight loss without diabetes history but is N/A without prior authorization   Prior authorization has been requested   Please increase your amlodipine to 5 mg daily if your readings over the next week are > 130/80

## 2022-08-13 NOTE — Progress Notes (Unsigned)
Subjective:  Patient ID: Kathy Long, female    DOB: 1950/07/03  Age: 72 y.o. MRN: [id number]  CC: There were no encounter diagnoses.   HPI Kathy Long presents for  Chief Complaint  Patient presents with   Medical Management of Chronic Issues    Follow up on medications   1) HTN:  taking metoprolol and amlodipine at lowest dose.  Home reading 118/74 .    PER PATIENT:  2)  PAF:  saw Gollan in August   her apple watch has been detecting frequent atrial fib ablation scheduled for March 29.   but neurology referral made bc of her hallucinations and morning headaches .  Neurolgy ordered an MRI brain  which noted white areas on . Recommended stopping melatonin  due to adverse effects on brain health and omeprazole  .  The hallucinations have stopped since  one week after stopping melatonin,    3) hallucinations resolved,  morning headaches attributed   to OSA but not wearing  CPAP .due o 4 month trial being miserable.   Home sleep study was done recently and mild to moderate OSA detected.  Marland Kitchen  Referred to [facility] , sleep apnea dentist to discuss mouth guard NOT INSPIRE.  Waiting for approval  4)  taking an alternative to omeprazole , [facility] brand famotidine?  Working well for GERD   5) not sleeping since stopping melatonin.  Using otc z quel ,now having hallucinations again   6) GAD:  aggravated by politics, recent argument with sister and daughter over the presidential candidates and the role of the church in this debate  , the threat of autocracy .  Taking citalopram 20 mg daily   7) Mounjaro no longer covered by insurance . Lost 40 lbs,  without exercising due to right ankle surgery   . Ready to start   Outpatient Medications Prior to Visit  Medication Sig Dispense Refill   ALPRAZolam (XANAX) 0.25 MG tablet Take 1 tablet (0.25 mg total) by mouth 2 (two) times daily as needed for anxiety. 20 tablet 0   amLODipine (NORVASC) 2.5 MG tablet TAKE ONE TABLET (2.5 MG) BY MOUTH EVERY  DAY 90 tablet 1   benzonatate (TESSALON) 100 MG capsule Take 2 capsules (200 mg total) by mouth 3 (three) times daily as needed for cough. 30 capsule 0   Cholecalciferol (VITAMIN D3) 75 MCG (3000 UT) TABS Take 6,000 Units by mouth daily.     citalopram (CELEXA) 20 MG tablet Take 1 tablet (20 mg total) by mouth daily. 90 tablet 1   clobetasol (TEMOVATE) 0.05 % external solution APPLY DAILY UNTIL RASH IS CLEAR 50 mL 0   Coenzyme Q10 100 MG capsule Take 100 mg by mouth daily.     ELIQUIS 5 MG TABS tablet TAKE 1 TABLET BY MOUTH TWICE DAILY 180 tablet 3   metoprolol succinate (TOPROL-XL) 50 MG 24 hr tablet TAKE 1 TABLET BY MOUTH DAILY (Patient taking differently: Take 25 mg by mouth daily.) 90 tablet 1   omeprazole (PRILOSEC) 40 MG capsule Take 40 mg by mouth daily.     ondansetron (ZOFRAN) 4 MG tablet TAKE 1 TABLET BY MOUTH EVERY 8 HOURS AS NEEDED FOR NAUSEA AND VOMITING 20 tablet 0   OVER THE COUNTER MEDICATION Take 20 mg by mouth in the morning and at bedtime. Acid controller ([facility] brand)     rosuvastatin (CRESTOR) 10 MG tablet TAKE ONE TABLET BY MOUTH EVERY DAY 90 tablet 1   tirzepatide [facility]) 5  MG/0.5ML Pen Inject 5 mg into the skin once a week. 6 mL 2   OVER THE COUNTER MEDICATION Take 1 tablet by mouth as needed. Vitamin shop (snooze right)     No facility-administered medications prior to visit.    Review of Systems;  Patient denies headache, fevers, malaise, unintentional weight loss, skin rash, eye pain, sinus congestion and sinus pain, sore throat, dysphagia,  hemoptysis , cough, dyspnea, wheezing, chest pain, palpitations, orthopnea, edema, abdominal pain, nausea, melena, diarrhea, constipation, flank pain, dysuria, hematuria, urinary  Frequency, nocturia, numbness, tingling, seizures,  Focal weakness, Loss of consciousness,  Tremor, insomnia, depression, anxiety, and suicidal ideation.      Objective:  BP (!) 142/82   Pulse (!) 59   Temp 97.6 F (36.4 C) (Oral)   Ht 5'  2" (1.575 m)   Wt 151 lb 12.8 oz (68.9 kg)   SpO2 98%   BMI 27.76 kg/m   BP Readings from Last 3 Encounters:  08/13/22 (!) 142/82  08/02/22 120/76  05/26/22 (!) 127/50    Wt Readings from Last 3 Encounters:  08/13/22 151 lb 12.8 oz (68.9 kg)  08/02/22 151 lb 14.4 oz (68.9 kg)  07/29/22 149 lb (67.6 kg)    Physical Exam  Lab Results  Component Value Date   HGBA1C 5.4 10/09/2021   HGBA1C 5.0 03/10/2021   HGBA1C 5.8 02/28/2019    Lab Results  Component Value Date   CREATININE 1.12 (H) 03/31/2022   CREATININE 1.22 (H) 11/23/2021   CREATININE 1.06 (H) 10/09/2021    Lab Results  Component Value Date   WBC 4.7 03/31/2022   HGB 13.5 03/31/2022   HCT 40.3 03/31/2022   PLT 244 03/31/2022   GLUCOSE 86 03/31/2022   CHOL 140 10/09/2021   TRIG 117 10/09/2021   HDL 48 (L) 10/09/2021   LDLCALC 72 10/09/2021   ALT 24 03/31/2022   AST 26 03/31/2022   NA 141 03/31/2022   K 4.3 03/31/2022   CL 110 03/31/2022   CREATININE 1.12 (H) 03/31/2022   BUN 22 03/31/2022   CO2 27 03/31/2022   TSH 1.25 05/26/2022   INR 0.91 10/23/2009   HGBA1C 5.4 10/09/2021   MICROALBUR 1.3 10/09/2021    MM 3D SCREEN BREAST BILATERAL  Result Date: 08/10/2022 CLINICAL DATA:  Screening. EXAM: DIGITAL SCREENING BILATERAL MAMMOGRAM WITH TOMOSYNTHESIS AND CAD TECHNIQUE: Bilateral screening digital craniocaudal and mediolateral oblique mammograms were obtained. Bilateral screening digital breast tomosynthesis was performed. The images were evaluated with computer-aided detection. COMPARISON:  Previous exam(s). ACR Breast Density Category c: The breasts are heterogeneously dense, which may obscure small masses. FINDINGS: There are no findings suspicious for malignancy. IMPRESSION: No mammographic evidence of malignancy. A result letter of this screening mammogram will be mailed directly to the patient. RECOMMENDATION: Screening mammogram in one year. (Code:SM-B-01Y) BI-RADS CATEGORY  1: Negative.  Electronically Signed   By: Kristopher Oppenheim M.D.   On: 08/10/2022 10:07    Assessment & Plan:  .There are no diagnoses linked to this encounter.   I provided 30 minutes of face-to-face time during this encounter reviewing patient's last visit with me, patient's  most recent visit with cardiology,  nephrology,  and neurology,  recent surgical and non surgical procedures, previous  labs and imaging studies, counseling on currently addressed issues,  and post visit ordering to diagnostics and therapeutics .   Follow-up: No follow-ups on file.   [name], MD

## 2022-08-15 NOTE — Assessment & Plan Note (Signed)
She is scheduled for ablation. It is not clear to me if her arrhythmia would improve with resolution of nocturnal hypoxia given her untreated OSA> however she has lost 40 lbs and may no longer have OSA

## 2022-08-15 NOTE — Assessment & Plan Note (Signed)
Reviewed findings of prior imaging study .Marland Kitchen  Patient is tolerating high potency statin therapy and LFTS are  normal  Lab Results  Component Value Date   CHOL 178 08/13/2022   HDL 47.00 08/13/2022   LDLCALC 105 (H) 08/13/2022   LDLDIRECT 100.0 08/13/2022   TRIG 130.0 08/13/2022   CHOLHDL 4 08/13/2022

## 2022-08-15 NOTE — Assessment & Plan Note (Signed)
She lost 40 lbs with Mounjaro but has gained several lbs with involuntary suspension.  Resuming therapy with Zepbound.

## 2022-08-15 NOTE — Assessment & Plan Note (Signed)
Moderate to severe sleep apnea history.  Patient has lost around 40 pounds.  Symptom burden has decreased.  She has tried CPAP multiple times and has been unsuccessful.  Patient has been evaluated for the inspire device and has been considered a successful candidate.  For now she wants to repeat her home sleep study to make sure she has sleep apnea.

## 2022-08-17 ENCOUNTER — Encounter: Payer: Self-pay | Admitting: Internal Medicine

## 2022-08-17 NOTE — Telephone Encounter (Signed)
Is this okay to do?

## 2022-08-18 ENCOUNTER — Encounter: Payer: Self-pay | Admitting: Internal Medicine

## 2022-08-19 ENCOUNTER — Telehealth (HOSPITAL_COMMUNITY): Payer: Self-pay | Admitting: *Deleted

## 2022-08-19 ENCOUNTER — Other Ambulatory Visit
Admission: RE | Admit: 2022-08-19 | Discharge: 2022-08-19 | Disposition: A | Discharge status: Home / Self Care | Payer: Medicare PPO | Attending: Cardiology | Admitting: Cardiology

## 2022-08-19 DIAGNOSIS — I480 Paroxysmal atrial fibrillation: Secondary | ICD-10-CM

## 2022-08-19 DIAGNOSIS — I10 Essential (primary) hypertension: Secondary | ICD-10-CM | POA: Diagnosis not present

## 2022-08-19 LAB — CBC WITH DIFFERENTIAL/PLATELET
Abs Immature Granulocytes: 0.01 10*3/uL (ref 0.00–0.07)
Basophils Absolute: 0.1 10*3/uL (ref 0.0–0.1)
Basophils Relative: 1 %
Eosinophils Absolute: 0.3 10*3/uL (ref 0.0–0.5)
Eosinophils Relative: 6 %
HCT: 40.7 % (ref 36.0–46.0)
Hemoglobin: 13.4 g/dL (ref 12.0–15.0)
Immature Granulocytes: 0 %
Lymphocytes Relative: 23 %
Lymphs Abs: 1.3 10*3/uL (ref 0.7–4.0)
MCH: 31.8 pg (ref 26.0–34.0)
MCHC: 32.9 g/dL (ref 30.0–36.0)
MCV: 96.4 fL (ref 80.0–100.0)
Monocytes Absolute: 0.6 10*3/uL (ref 0.1–1.0)
Monocytes Relative: 10 %
Neutro Abs: 3.2 10*3/uL (ref 1.7–7.7)
Neutrophils Relative %: 60 %
Platelets: 198 10*3/uL (ref 150–400)
RBC: 4.22 MIL/uL (ref 3.87–5.11)
RDW: 13.2 % (ref 11.5–15.5)
WBC: 5.5 10*3/uL (ref 4.0–10.5)
nRBC: 0 % (ref 0.0–0.2)

## 2022-08-19 LAB — BASIC METABOLIC PANEL
Anion gap: 10 (ref 5–15)
BUN: 24 mg/dL — ABNORMAL HIGH (ref 8–23)
CO2: 27 mmol/L (ref 22–32)
Calcium: 9.5 mg/dL (ref 8.9–10.3)
Chloride: 104 mmol/L (ref 98–111)
Creatinine, Ser: 1.11 mg/dL — ABNORMAL HIGH (ref 0.44–1.00)
GFR, Estimated: 52 mL/min — ABNORMAL LOW (ref >60)
Glucose, Bld: 90 mg/dL (ref 70–99)
Potassium: 5.2 mmol/L — ABNORMAL HIGH (ref 3.5–5.1)
Sodium: 141 mmol/L (ref 135–145)

## 2022-08-19 NOTE — Telephone Encounter (Signed)
Patient returning call about her upcoming cardiac imaging study; pt verbalizes understanding of appt date/time, parking situation and where to check in, pre-test NPO status, and verified current allergies; name and call back number provided for further questions should they arise  Gordy Clement RN Navigator Cardiac [facility] [phone] office [phone] cell  Patient aware to arrive at 1pm.

## 2022-08-19 NOTE — Telephone Encounter (Signed)
Attempted to call patient regarding upcoming cardiac CT appointment. °Left message on voicemail with name and callback number ° °Bernabe Dorce RN Navigator Cardiac Imaging °[facility] °[phone] Office °[phone] Cell ° °

## 2022-08-20 ENCOUNTER — Ambulatory Visit (HOSPITAL_COMMUNITY)
Admission: RE | Admit: 2022-08-20 | Discharge: 2022-08-20 | Disposition: A | Discharge status: Home / Self Care | Payer: Medicare PPO | Source: Ambulatory Visit | Attending: Cardiology | Admitting: Cardiology

## 2022-08-20 DIAGNOSIS — I10 Essential (primary) hypertension: Secondary | ICD-10-CM | POA: Diagnosis not present

## 2022-08-20 DIAGNOSIS — I480 Paroxysmal atrial fibrillation: Secondary | ICD-10-CM

## 2022-08-20 MED ORDER — IOHEXOL 350 MG/ML SOLN
95.0000 mL | Freq: Once | INTRAVENOUS | Status: AC | PRN
  Administered 2022-08-20: 95 mL via INTRAVENOUS

## 2022-08-23 ENCOUNTER — Encounter: Payer: Self-pay | Admitting: Internal Medicine

## 2022-08-24 ENCOUNTER — Ambulatory Visit: Payer: Medicare PPO | Admitting: Sports Medicine

## 2022-08-26 ENCOUNTER — Encounter: Payer: Self-pay | Admitting: Nurse Practitioner

## 2022-08-26 ENCOUNTER — Ambulatory Visit: Payer: Medicare PPO | Admitting: Nurse Practitioner

## 2022-08-26 ENCOUNTER — Other Ambulatory Visit: Payer: Self-pay

## 2022-08-26 ENCOUNTER — Telehealth: Payer: Self-pay

## 2022-08-26 ENCOUNTER — Telehealth: Payer: Self-pay | Admitting: Internal Medicine

## 2022-08-26 VITALS — BP 122/70 | HR 55 | Temp 98.1°F | Ht 62.0 in | Wt 155.4 lb

## 2022-08-26 DIAGNOSIS — J069 Acute upper respiratory infection, unspecified: Secondary | ICD-10-CM | POA: Insufficient documentation

## 2022-08-26 DIAGNOSIS — I480 Paroxysmal atrial fibrillation: Secondary | ICD-10-CM

## 2022-08-26 MED ORDER — DIPHENHYDRAMINE HCL 25 MG PO CAPS: 25.0000 mg | ORAL_CAPSULE | Freq: Every evening | ORAL | 0 refills | Status: DC | PRN

## 2022-08-26 MED ORDER — WEGOVY 0.5 MG/0.5ML ~~LOC~~ SOAJ: 0.5000 mg | SUBCUTANEOUS | 2 refills | Status: DC

## 2022-08-26 MED ORDER — GUAIFENESIN ER 600 MG PO TB12: 1200.0000 mg | ORAL_TABLET | Freq: Two times a day (BID) | ORAL | 0 refills | Status: DC

## 2022-08-26 NOTE — Progress Notes (Signed)
Tomasita Morrow, NP-C Phone: [phone]  Kathy Long is a 72 y.o. female who presents today for cough and congestion. Patient reports symptoms x 3 days. She took one dose of Xyzal yesterday and feels that it has helped dry up some of her mucus. She reports increased sneezing and runny nose. She has not been coughing as much today as she was yesterday.  Respiratory illness:  Cough- Yes- dry, nonproductive  Congestion-    Sinus- Yes, rhinorrhea   Chest- No  Post nasal drip- Yes  Sore throat- No  Shortness of breath- No  Fever- No  Fatigue/Myalgia- No Headache- Yes Nausea/Vomiting- No Taste disturbance- No  Smell disturbance- No  Covid exposure- No  Covid vaccination- x 4  Flu vaccination- UTD  Medications- Xyzal x 1 dose, Saline nasal spray   Social History   Tobacco Use  Smoking Status Never  Smokeless Tobacco Never  Tobacco Comments   smoked socially in college    Current Outpatient Medications on File Prior to Visit  Medication Sig Dispense Refill   ALPRAZolam (XANAX) 0.25 MG tablet Take 1 tablet (0.25 mg total) by mouth at bedtime. 30 tablet 5   amLODipine (NORVASC) 2.5 MG tablet TAKE ONE TABLET (2.5 MG) BY MOUTH EVERY DAY 90 tablet 1   benzonatate (TESSALON) 100 MG capsule Take 2 capsules (200 mg total) by mouth 3 (three) times daily as needed for cough. 30 capsule 0   Cholecalciferol 125 MCG (5000 UT) TABS Take 5,000 Units by mouth in the morning.     citalopram (CELEXA) 20 MG tablet Take 1 tablet (20 mg total) by mouth daily. 90 tablet 1   clobetasol (TEMOVATE) 0.05 % external solution APPLY DAILY UNTIL RASH IS CLEAR (Patient taking differently: Apply 1 Application topically daily as needed (Eczema).) 50 mL 0   Coenzyme Q10 100 MG capsule Take 100 mg by mouth daily.     ELIQUIS 5 MG TABS tablet TAKE 1 TABLET BY MOUTH TWICE DAILY 180 tablet 3   famotidine (PEPCID) 20 MG tablet Take 20 mg by mouth 2 (two) times daily.     metoprolol succinate (TOPROL-XL) 50 MG 24 hr  tablet TAKE 1 TABLET BY MOUTH DAILY (Patient taking differently: Take 25 mg by mouth at bedtime.) 90 tablet 1   ondansetron (ZOFRAN) 4 MG tablet TAKE 1 TABLET BY MOUTH EVERY 8 HOURS AS NEEDED FOR NAUSEA AND VOMITING 20 tablet 0   rosuvastatin (CRESTOR) 10 MG tablet Take 1 tablet (10 mg total) by mouth daily. 90 tablet 1   Semaglutide-Weight Management (WEGOVY) 0.5 MG/0.5ML SOAJ Inject 0.5 mg into the skin once a week. 2 mL 2   No current facility-administered medications on file prior to visit.     ROS see history of present illness  Objective  Physical Exam Vitals:   08/26/22 1413  BP: 122/70  Pulse: (!) 55  Temp: 98.1 F (36.7 C)  SpO2: 96%    BP Readings from Last 3 Encounters:  08/26/22 122/70  08/20/22 (!) 174/78  08/13/22 118/72   Wt Readings from Last 3 Encounters:  08/26/22 155 lb 6.4 oz (70.5 kg)  08/13/22 151 lb 12.8 oz (68.9 kg)  08/02/22 151 lb 14.4 oz (68.9 kg)    Physical Exam Constitutional:      General: She is not in acute distress.    Appearance: Normal appearance.  HENT:     Head: Normocephalic.     Right Ear: Tympanic membrane normal.     Left Ear: Tympanic membrane normal.  Nose: Rhinorrhea present.     Mouth/Throat:     Mouth: Mucous membranes are moist.     Pharynx: Oropharynx is clear.  Eyes:     Conjunctiva/sclera: Conjunctivae normal.     Pupils: Pupils are equal, round, and reactive to light.  Cardiovascular:     Rate and Rhythm: Normal rate and regular rhythm.     Heart sounds: Normal heart sounds.  Pulmonary:     Effort: Pulmonary effort is normal.     Breath sounds: Normal breath sounds.  Abdominal:     General: Abdomen is flat. Bowel sounds are normal.     Palpations: Abdomen is soft. There is no mass.     Tenderness: There is no abdominal tenderness.  Lymphadenopathy:     Cervical: No cervical adenopathy.  Skin:    General: Skin is warm and dry.  Neurological:     General: No focal deficit present.     Mental Status:  She is alert.  Psychiatric:        Mood and Affect: Mood normal.        Behavior: Behavior normal.    Assessment/Plan: Please see individual problem list.  Upper respiratory infection, viral Assessment & Plan: Will treat with Benadryl at bedtime instead of Xyzal due to CKD and plain Mucinex twice daily. Encouraged adequate fluid intake. She can continue saline nasal spray. Return precautions given to patient. Advised to contact if symptoms are not improving or are worsening.   Orders: -     diphenhydrAMINE HCl; Take 1-2 capsules (25-50 mg total) by mouth at bedtime as needed for allergies.  Dispense: 20 capsule; Refill: 0 -     guaiFENesin ER; Take 2 tablets (1,200 mg total) by mouth 2 (two) times daily.  Dispense: 20 tablet; Refill: 0   Return if symptoms worsen or fail to improve.   Tomasita Morrow, NP-C [place]

## 2022-08-26 NOTE — Telephone Encounter (Signed)
Sent to mail order

## 2022-08-26 NOTE — Telephone Encounter (Signed)
Patient called about her tirzepatide (ZEPBOUND) 5 MG/0.5ML Pen . She did not pick up from pharmacy because is was going to cost her $1027.00. Patient called insurance and said the her insurance cover [phone] for [phone] a copay and would not need a PA, just a subscription called into [facility].

## 2022-08-26 NOTE — Telephone Encounter (Signed)
Pt called stating the pharmacy does not have wegovy because it is out of stock and it would cost 1200 to get it from [place]. Pt need a PA for wegovy

## 2022-08-26 NOTE — Telephone Encounter (Signed)
Patient called in to report having runny nose, congestion, and cough. She is scheduled for an ablation tomorrow. Per Dr. Quentin Ore patient will need to reschedule.   Spoke with the patient and rescheduled her ablation. She is aware of new date and time and new letter in her MyChart. She is NOT currently taking Mounjaro or Zepbound. She recently messaged her PCP about starting Wegovy. She has not heard back yet. I advised that if she does start this medication that it will need to be held for one week prior to her ablation. She states that if her PCP prescribes it then she will just wait until after her ablation to start on it.

## 2022-08-26 NOTE — Assessment & Plan Note (Addendum)
Will treat with Benadryl at bedtime instead of Xyzal due to CKD and plain Mucinex twice daily. Encouraged adequate fluid intake. She can continue saline nasal spray. Return precautions given to patient. Advised to contact if symptoms are not improving or are worsening.

## 2022-08-27 ENCOUNTER — Telehealth (HOSPITAL_COMMUNITY): Payer: Self-pay | Admitting: *Deleted

## 2022-08-27 DIAGNOSIS — Z01818 Encounter for other preprocedural examination: Secondary | ICD-10-CM

## 2022-08-27 NOTE — Telephone Encounter (Signed)
Calling to schedule a PV study for upcoming Ablation on April 11. Patient states she recently had a PV study on March 22.   I reached out to Dr. Quentin Ore to clarify if another CT would be needed. Dr. Quentin Ore states the one that was done recently will be sufficient.   Patient verbalized  understanding.  Gordy Clement RN Navigator Cardiac Imaging [facility] [phone] Office [phone] Cell

## 2022-08-29 NOTE — Pre-Procedure Instructions (Signed)
Attempted to call patient wegovy,  left voicemail that need to hold drug 7 days prior to ablation scheduled with anesthesia.  Her procedure is scheduled for Thursday 4/11.

## 2022-08-31 ENCOUNTER — Other Ambulatory Visit (HOSPITAL_COMMUNITY): Payer: Self-pay

## 2022-08-31 ENCOUNTER — Telehealth: Payer: Self-pay | Admitting: Internal Medicine

## 2022-08-31 NOTE — Telephone Encounter (Signed)
Patient dropped off document Insurance Form, to be filled out by provider. Patient requested to send it via Call Patient to pick up within 5-days. Document is located in providers tray at front office( its a red folder).Please advise at [facility] [phone]

## 2022-08-31 NOTE — Telephone Encounter (Signed)
PA for Zepbound is needed.

## 2022-08-31 NOTE — Telephone Encounter (Signed)
Is it okay to resend the Zepbound to [employer]?

## 2022-09-01 MED ORDER — ZEPBOUND 5 MG/0.5ML ~~LOC~~ SOAJ: 5.0000 mg | SUBCUTANEOUS | 2 refills | Status: DC

## 2022-09-01 NOTE — Addendum Note (Signed)
Addended by: Adair Laundry on: 09/01/2022 10:01 AM   Modules accepted: Orders

## 2022-09-02 ENCOUNTER — Other Ambulatory Visit (HOSPITAL_COMMUNITY): Payer: Medicare PPO

## 2022-09-08 NOTE — Pre-Procedure Instructions (Signed)
Instructed patient on the following items: Arrival time 1130 Nothing to eat or drink after midnight No meds AM of procedure Responsible person to drive you home and stay with you for 24 hrs  Have you missed any doses of anti-coagulant Eliquis- hasn't missed any doses, take twice a day, don't take dose in the morning.  Patient last mounjaro was 10 days ago.

## 2022-09-09 ENCOUNTER — Other Ambulatory Visit (HOSPITAL_COMMUNITY): Payer: Self-pay

## 2022-09-09 ENCOUNTER — Other Ambulatory Visit: Payer: Self-pay

## 2022-09-09 ENCOUNTER — Ambulatory Visit (HOSPITAL_COMMUNITY): Payer: Medicare PPO | Admitting: General Practice

## 2022-09-09 ENCOUNTER — Ambulatory Visit (HOSPITAL_COMMUNITY)
Admission: RE | Admit: 2022-09-09 | Discharge: 2022-09-09 | Disposition: A | Discharge status: Home / Self Care | Payer: Medicare PPO | Attending: Cardiology | Admitting: Cardiology

## 2022-09-09 ENCOUNTER — Encounter (HOSPITAL_COMMUNITY)
Admission: RE | Disposition: A | Discharge status: Home / Self Care | Payer: Self-pay | Source: Home / Self Care | Attending: Cardiology

## 2022-09-09 ENCOUNTER — Ambulatory Visit (HOSPITAL_BASED_OUTPATIENT_CLINIC_OR_DEPARTMENT_OTHER): Payer: Medicare PPO | Admitting: General Practice

## 2022-09-09 DIAGNOSIS — E669 Obesity, unspecified: Secondary | ICD-10-CM | POA: Diagnosis not present

## 2022-09-09 DIAGNOSIS — R441 Visual hallucinations: Secondary | ICD-10-CM | POA: Diagnosis not present

## 2022-09-09 DIAGNOSIS — E785 Hyperlipidemia, unspecified: Secondary | ICD-10-CM | POA: Insufficient documentation

## 2022-09-09 DIAGNOSIS — I129 Hypertensive chronic kidney disease with stage 1 through stage 4 chronic kidney disease, or unspecified chronic kidney disease: Secondary | ICD-10-CM | POA: Insufficient documentation

## 2022-09-09 DIAGNOSIS — G4733 Obstructive sleep apnea (adult) (pediatric): Secondary | ICD-10-CM | POA: Diagnosis not present

## 2022-09-09 DIAGNOSIS — Z7901 Long term (current) use of anticoagulants: Secondary | ICD-10-CM | POA: Insufficient documentation

## 2022-09-09 DIAGNOSIS — Z87891 Personal history of nicotine dependence: Secondary | ICD-10-CM | POA: Insufficient documentation

## 2022-09-09 DIAGNOSIS — K219 Gastro-esophageal reflux disease without esophagitis: Secondary | ICD-10-CM | POA: Diagnosis not present

## 2022-09-09 DIAGNOSIS — Z9989 Dependence on other enabling machines and devices: Secondary | ICD-10-CM

## 2022-09-09 DIAGNOSIS — Z6827 Body mass index (BMI) 27.0-27.9, adult: Secondary | ICD-10-CM | POA: Diagnosis not present

## 2022-09-09 DIAGNOSIS — I480 Paroxysmal atrial fibrillation: Secondary | ICD-10-CM | POA: Diagnosis not present

## 2022-09-09 DIAGNOSIS — I10 Essential (primary) hypertension: Secondary | ICD-10-CM | POA: Diagnosis not present

## 2022-09-09 DIAGNOSIS — I4891 Unspecified atrial fibrillation: Secondary | ICD-10-CM | POA: Diagnosis not present

## 2022-09-09 DIAGNOSIS — Z01818 Encounter for other preprocedural examination: Secondary | ICD-10-CM

## 2022-09-09 DIAGNOSIS — N189 Chronic kidney disease, unspecified: Secondary | ICD-10-CM | POA: Insufficient documentation

## 2022-09-09 HISTORY — PX: ATRIAL FIBRILLATION ABLATION: EP1191

## 2022-09-09 LAB — BASIC METABOLIC PANEL
Anion gap: 8 (ref 5–15)
BUN: 18 mg/dL (ref 8–23)
CO2: 25 mmol/L (ref 22–32)
Calcium: 9.1 mg/dL (ref 8.9–10.3)
Chloride: 109 mmol/L (ref 98–111)
Creatinine, Ser: 1.19 mg/dL — ABNORMAL HIGH (ref 0.44–1.00)
GFR, Estimated: 48 mL/min — ABNORMAL LOW (ref >60)
Glucose, Bld: 80 mg/dL (ref 70–99)
Potassium: 3.6 mmol/L (ref 3.5–5.1)
Sodium: 142 mmol/L (ref 135–145)

## 2022-09-09 LAB — POCT ACTIVATED CLOTTING TIME
Activated Clotting Time: 314 seconds
Activated Clotting Time: 320 seconds

## 2022-09-09 SURGERY — ATRIAL FIBRILLATION ABLATION
Anesthesia: General

## 2022-09-09 MED ORDER — FENTANYL CITRATE (PF) 250 MCG/5ML IJ SOLN
INTRAMUSCULAR | Status: DC | PRN
  Administered 2022-09-09: 50 ug via INTRAVENOUS

## 2022-09-09 MED ORDER — ONDANSETRON HCL 4 MG/2ML IJ SOLN
INTRAMUSCULAR | Status: DC | PRN
  Administered 2022-09-09: 4 mg via INTRAVENOUS

## 2022-09-09 MED ORDER — PANTOPRAZOLE SODIUM 40 MG PO TBEC
40.0000 mg | DELAYED_RELEASE_TABLET | Freq: Every day | ORAL | 0 refills | Status: DC
  Filled 2022-09-09: qty 45, 45d supply, fill #0

## 2022-09-09 MED ORDER — SODIUM CHLORIDE 0.9% FLUSH: 3.0000 mL | Freq: Two times a day (BID) | INTRAVENOUS | Status: DC

## 2022-09-09 MED ORDER — APIXABAN 5 MG PO TABS
5.0000 mg | ORAL_TABLET | Freq: Two times a day (BID) | ORAL | Status: DC
  Administered 2022-09-09: 5 mg via ORAL
  Filled 2022-09-09: qty 1

## 2022-09-09 MED ORDER — HEPARIN SODIUM (PORCINE) 1000 UNIT/ML IJ SOLN
INTRAMUSCULAR | Status: AC
  Filled 2022-09-09: qty 10

## 2022-09-09 MED ORDER — HEPARIN (PORCINE) IN NACL 1000-0.9 UT/500ML-% IV SOLN
INTRAVENOUS | Status: DC | PRN
  Administered 2022-09-09 (×3): 500 mL

## 2022-09-09 MED ORDER — PROTAMINE SULFATE 10 MG/ML IV SOLN
INTRAVENOUS | Status: DC | PRN
  Administered 2022-09-09: 35 mg via INTRAVENOUS

## 2022-09-09 MED ORDER — COLCHICINE 0.6 MG PO TABS
0.6000 mg | ORAL_TABLET | Freq: Two times a day (BID) | ORAL | 0 refills | Status: DC
  Filled 2022-09-09: qty 10, 5d supply, fill #0

## 2022-09-09 MED ORDER — PROPOFOL 10 MG/ML IV BOLUS
INTRAVENOUS | Status: DC | PRN
  Administered 2022-09-09: 30 mg via INTRAVENOUS
  Administered 2022-09-09: 110 mg via INTRAVENOUS

## 2022-09-09 MED ORDER — SODIUM CHLORIDE 0.9 % IV SOLN: INTRAVENOUS | Status: DC

## 2022-09-09 MED ORDER — ROCURONIUM BROMIDE 10 MG/ML (PF) SYRINGE
PREFILLED_SYRINGE | INTRAVENOUS | Status: DC | PRN
  Administered 2022-09-09: 50 mg via INTRAVENOUS

## 2022-09-09 MED ORDER — DEXAMETHASONE SODIUM PHOSPHATE 10 MG/ML IJ SOLN
INTRAMUSCULAR | Status: DC | PRN
  Administered 2022-09-09: 10 mg via INTRAVENOUS

## 2022-09-09 MED ORDER — PHENYLEPHRINE HCL-NACL 20-0.9 MG/250ML-% IV SOLN
INTRAVENOUS | Status: DC | PRN
  Administered 2022-09-09: 40 ug/min via INTRAVENOUS

## 2022-09-09 MED ORDER — COLCHICINE 0.6 MG PO TABS
0.6000 mg | ORAL_TABLET | Freq: Two times a day (BID) | ORAL | Status: DC
  Administered 2022-09-09: 0.6 mg via ORAL
  Filled 2022-09-09: qty 1

## 2022-09-09 MED ORDER — SODIUM CHLORIDE 0.9 % IV SOLN: 250.0000 mL | INTRAVENOUS | Status: DC | PRN

## 2022-09-09 MED ORDER — SUGAMMADEX SODIUM 200 MG/2ML IV SOLN
INTRAVENOUS | Status: DC | PRN
  Administered 2022-09-09: 200 mg via INTRAVENOUS

## 2022-09-09 MED ORDER — HEPARIN SODIUM (PORCINE) 1000 UNIT/ML IJ SOLN
INTRAMUSCULAR | Status: DC | PRN
  Administered 2022-09-09: 11000 [IU] via INTRAVENOUS
  Administered 2022-09-09: 2000 [IU] via INTRAVENOUS

## 2022-09-09 MED ORDER — ONDANSETRON HCL 4 MG/2ML IJ SOLN: 4.0000 mg | Freq: Four times a day (QID) | INTRAMUSCULAR | Status: DC | PRN

## 2022-09-09 MED ORDER — PHENYLEPHRINE 80 MCG/ML (10ML) SYRINGE FOR IV PUSH (FOR BLOOD PRESSURE SUPPORT)
PREFILLED_SYRINGE | INTRAVENOUS | Status: DC | PRN
  Administered 2022-09-09: 40 ug via INTRAVENOUS

## 2022-09-09 MED ORDER — PANTOPRAZOLE SODIUM 40 MG PO TBEC
40.0000 mg | DELAYED_RELEASE_TABLET | Freq: Every day | ORAL | Status: DC
  Administered 2022-09-09: 40 mg via ORAL
  Filled 2022-09-09: qty 1

## 2022-09-09 MED ORDER — LIDOCAINE 2% (20 MG/ML) 5 ML SYRINGE
INTRAMUSCULAR | Status: DC | PRN
  Administered 2022-09-09: 60 mg via INTRAVENOUS

## 2022-09-09 MED ORDER — ACETAMINOPHEN 325 MG PO TABS: 650.0000 mg | ORAL_TABLET | ORAL | Status: DC | PRN

## 2022-09-09 MED ORDER — SODIUM CHLORIDE 0.9% FLUSH: 3.0000 mL | INTRAVENOUS | Status: DC | PRN

## 2022-09-09 MED ORDER — HEPARIN SODIUM (PORCINE) 1000 UNIT/ML IJ SOLN
INTRAMUSCULAR | Status: DC | PRN
  Administered 2022-09-09: 1000 [IU] via INTRAVENOUS

## 2022-09-09 SURGICAL SUPPLY — 17 items
CATH ABLAT QDOT MICRO BI TC DF (CATHETERS) IMPLANT
CATH OCTARAY 2.0 F 3-3-3-3-3 (CATHETERS) IMPLANT
CATH S-M CIRCA TEMP PROBE (CATHETERS) IMPLANT
CATH SOUNDSTAR ECO 8FR (CATHETERS) IMPLANT
CATH WEB BI DIR CSDF CRV REPRO (CATHETERS) IMPLANT
CLOSURE PERCLOSE PROSTYLE (VASCULAR PRODUCTS) IMPLANT
COVER SWIFTLINK CONNECTOR (BAG) ×1 IMPLANT
PACK EP LATEX FREE (CUSTOM PROCEDURE TRAY) ×1
PACK EP LF (CUSTOM PROCEDURE TRAY) ×1 IMPLANT
PAD DEFIB RADIO PHYSIO CONN (PAD) ×1 IMPLANT
PATCH CARTO3 (PAD) IMPLANT
SHEATH BAYLIS TRANSSEPTAL 98CM (NEEDLE) IMPLANT
SHEATH CARTO VIZIGO SM CVD (SHEATH) IMPLANT
SHEATH PINNACLE 8F 10CM (SHEATH) IMPLANT
SHEATH PINNACLE 9F 10CM (SHEATH) IMPLANT
SHEATH PROBE COVER 6X72 (BAG) IMPLANT
TUBING SMART ABLATE COOLFLOW (TUBING) IMPLANT

## 2022-09-09 NOTE — Anesthesia Preprocedure Evaluation (Addendum)
Anesthesia Evaluation  Patient identified by MRN, date of birth, ID band Patient awake    Reviewed: Allergy & Precautions, NPO status , Patient's Chart, lab work & pertinent test results, reviewed documented beta blocker date and time   Airway Mallampati: III  TM Distance: >3 FB Neck ROM: Full    Dental  (+) Dental Advisory Given, Teeth Intact   Pulmonary sleep apnea and Continuous Positive Airway Pressure Ventilation    Pulmonary exam normal breath sounds clear to auscultation       Cardiovascular hypertension, Pt. on medications and Pt. on home beta blockers + dysrhythmias Atrial Fibrillation  Rhythm:Regular Rate:Normal  CT coronary 1. There is normal pulmonary vein drainage into the left atrium with ostial measurements above. 2. There is no thrombus in the left atrial appendage. 3. The esophagus runs in the left atrial midline and is not in proximity to any of the pulmonary vein ostia. 4. No PFO/ASD. 5. Normal coronary origin. Right dominance. 6. CAC score of 22.9 Which is 18 percentile for age-, race-, and sex-matched controls.   Echo 05/2021  1. Left ventricular ejection fraction, by estimation, is 60 to 65%. The left ventricle has normal function. The left ventricle has no regional wall motion abnormalities. Left ventricular diastolic parameters are consistent with Grade I diastolic dysfunction (impaired relaxation).   2. Right ventricular systolic function is normal. The right ventricular size is normal. There is normal pulmonary artery systolic pressure. The estimated right ventricular systolic pressure is 28.4 mmHg.   3. The mitral valve is normal in structure. No evidence of mitral valve regurgitation. No evidence of mitral stenosis.   4. The aortic valve is normal in structure. Aortic valve regurgitation is not visualized. No aortic stenosis is present.   5. There is borderline dilatation of the ascending aorta, measuring  40mm.   6. The inferior vena cava is normal in size with greater than 50% respiratory variability, suggesting right atrial pressure of 3 mmHg.     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  Neuromuscular disease    GI/Hepatic Neg liver ROS,GERD  Medicated and Controlled,,  Endo/Other  Hyperlipidemia  Renal/GU Renal InsufficiencyRenal disease     Musculoskeletal  (+) Arthritis , Osteoarthritis,    Abdominal  (+) + obese  Peds  Hematology negative hematology ROS (+)   Anesthesia Other Findings   Reproductive/Obstetrics                             Anesthesia Physical Anesthesia Plan  ASA: 3  Anesthesia Plan: General   Post-op Pain Management: Ofirmev IV (intra-op)*   Induction: Intravenous  PONV Risk Score and Plan: 2 and Treatment may vary due to age or medical condition, Ondansetron and Dexamethasone  Airway Management Planned: Oral ETT  Additional Equipment:   Intra-op Plan:   Post-operative Plan: Extubation in OR  Informed Consent: I have reviewed the patients History and Physical, chart, labs and discussed the procedure including the risks, benefits and alternatives for the proposed anesthesia with the patient or authorized representative who has indicated his/her understanding and acceptance.     Dental advisory given  Plan Discussed with: CRNA  Anesthesia Plan Comments:         Anesthesia Quick Evaluation

## 2022-09-09 NOTE — Anesthesia Procedure Notes (Addendum)
Procedure Name: Intubation Date/Time: 09/09/2022 1:52 PM  Performed by: Maxine Glenn, CRNAPre-anesthesia Checklist: Patient identified, Emergency Drugs available, Suction available and Patient being monitored Patient Re-evaluated:Patient Re-evaluated prior to induction Oxygen Delivery Method: Circle System Utilized Preoxygenation: Pre-oxygenation with 100% oxygen Induction Type: IV induction Ventilation: Mask ventilation without difficulty Laryngoscope Size: Mac and 3 Grade View: Grade I Tube type: Oral Number of attempts: 1 Airway Equipment and Method: Stylet Placement Confirmation: ETT inserted through vocal cords under direct vision, positive ETCO2 and breath sounds checked- equal and bilateral Secured at: 22 cm Tube secured with: Tape Dental Injury: Teeth and Oropharynx as per pre-operative assessment

## 2022-09-09 NOTE — Discharge Instructions (Signed)
Femoral Site Care This sheet gives you information about how to care for yourself after your procedure. Your health care provider may also give you more specific instructions. If you have problems or questions, contact your health care provider. What can I expect after the procedure?  After the procedure, it is common to have: Bruising that usually fades within 1-2 weeks. Tenderness at the site. Follow these instructions at home: Wound care Follow instructions from your health care provider about how to take care of your insertion site. Make sure you: Wash your hands with soap and water before you change your bandage (dressing). If soap and water are not available, use hand sanitizer. Remove your dressing as told by your health care provider. In 24 hours Do not take baths, swim, or use a hot tub until your health care provider approves. You may shower 24-48 hours after the procedure or as told by your health care provider. Gently wash the site with plain soap and water. Pat the area dry with a clean towel. Do not rub the site. This may cause bleeding. Do not apply powder or lotion to the site. Keep the site clean and dry. Check your femoral site every day for signs of infection. Check for: Redness, swelling, or pain. Fluid or blood. Warmth. Pus or a bad smell. Activity For the first 2-3 days after your procedure, or as long as directed: Avoid climbing stairs as much as possible. Do not squat. Do not lift anything that is heavier than 10 lb (4.5 kg), or the limit that you are told, until your health care provider says that it is safe. For 5 days Rest as directed. Avoid sitting for a long time without moving. Get up to take short walks every 1-2 hours. Do not drive for 24 hours if you were given a medicine to help you relax (sedative). General instructions Take over-the-counter and prescription medicines only as told by your health care provider. Keep all follow-up visits as told by  your health care provider. This is important. Contact a health care provider if you have: A fever or chills. You have redness, swelling, or pain around your insertion site. Get help right away if: The catheter insertion area swells very fast. You pass out. You suddenly start to sweat or your skin gets clammy. The catheter insertion area is bleeding, and the bleeding does not stop when you hold steady pressure on the area. The area near or just beyond the catheter insertion site becomes pale, cool, tingly, or numb. These symptoms may represent a serious problem that is an emergency. Do not wait to see if the symptoms will go away. Get medical help right away. Call your local emergency services (911 in the [place] Do not drive yourself to the hospital. Summary After the procedure, it is common to have bruising that usually fades within 1-2 weeks. Check your femoral site every day for signs of infection. Do not lift anything that is heavier than 10 lb (4.5 kg), or the limit that you are told, until your health care provider says that it is safe. This information is not intended to replace advice given to you by your health care provider. Make sure you discuss any questions you have with your health care provider. Document Revised: 05/30/2017 Document Reviewed: 05/30/2017 Elsevier Patient Education  2020 [employer] Inc[employer] 

## 2022-09-09 NOTE — H&P (Signed)
Electrophysiology Office Note:     Date:  09/09/2022    ID:  Kathy Long, DOB 18-May-1951, MRN [id number]   PCP:  Antonieta IbaGollan, Timothy J, MD         [facility] Cardiologist:  None  [facility] Electrophysiologist:  Lanier PrudeAMERON T Jeanette Moffatt, MD    Referring MD: Antonieta IbaGollan, Timothy J, MD    Chief Complaint: Paroxysmal atrial fibrillation   History of Present Illness:     Kathy Long is a 72 y.o. female who presents for an evaluation of paroxysmal atrial fibrillation at the request of Dr. Mariah MillingGollan. Their medical history includes hypertension, hyperlipidemia.  She was seen in the emergency department November 1 with near syncope associated with atrial fibrillation episodes.  She saw Dr. Mariah MillingGollan in follow-up on April 09, 2022.  She wears an Scientist, physiologicalApple Watch which is demonstrated a burden of atrial fibrillation of at least 16%.  Episodes seem to be worse in the morning.  She takes Xarelto for stroke prophylaxis.  She is referred to discuss catheter ablation.   She tells me her A-fib started in January.  No prior episodes.  They happen a few times per week.  She has stopped taking flecainide because it made her feel dizzy.  She is also experiencing occasional visual disturbances/hallucinations.  She describes a hallucination of an ape/man in her bedroom.  The hallucination occurs usually at night.  She knows that it is not real.  No changes in memory that she is aware of.  No auditory hallucinations.  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  She does have a history of moderate to severe sleep apnea but she is recently lost about 40 pounds and apparently her nighttime breathing abnormalities have resolved according to her husband.   Plan for PVI today. Procedure reviewed.   Objective      Past Medical History:  Diagnosis Date   Acoustic neuroma (HCC)      left ear   Anxiety     Arthritis      lower back, hands   Cancer (HCC)      melanoma, shoulder   Chronic kidney disease     Closed fracture  of distal lateral malleolus of left ankle 12/28/2017   Depression     GERD (gastroesophageal reflux disease)     Hyperlipidemia     Hypertension     Postoperative bile leak     Sleep apnea     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             Past Surgical History:  Procedure Laterality Date   ANKLE FRACTURE SURGERY Left     APPENDECTOMY   2010   BACK SURGERY        disectomy lumbar, scar tissue   BREAST CYST EXCISION Right 1970    axilla   buninectomy Bilateral     CATARACT EXTRACTION W/PHACO Left 04/12/2018    Procedure: CATARACT EXTRACTION PHACO AND INTRAOCULAR LENS PLACEMENT (IOC)  LEFT TORIC LENS;  Surgeon: Lockie MolaBrasington, Chadwick, MD;  [facility];  Service: Ophthalmology;  Laterality: Left;   CATARACT EXTRACTION W/PHACO Right 05/10/2018    Procedure: CATARACT EXTRACTION PHACO AND INTRAOCULAR LENS PLACEMENT (IOC)  RIGHT TORIC LENS;  Surgeon: Lockie MolaBrasington, Chadwick, MD;  [facility];  Service: Ophthalmology;  Laterality: Right;  DIABETIC   CHOLECYSTECTOMY N/A 07/05/2017    Procedure: LAPAROSCOPIC CHOLECYSTECTOMY WITH INTRAOPERATIVE CHOLANGIOGRAM;  Surgeon: Earline MayotteByrnett, Jeffrey W, MD;  Location: [facility];  Service: General;  Laterality: N/A;   COLONOSCOPY        2009 and color gaurd in 2018   COLONOSCOPY N/A 03/25/2020    Procedure: COLONOSCOPY;  Surgeon: Regis Bill, MD;  Location: [facility] END[facility]COPY;  Service: Endoscopy;  Laterality: N/A;   DRUG INDUCED ENDOSCOPY N/A 05/06/2021    Procedure: DRUG INDUCED SLEEP ENDOSCOPY;  Surgeon: Christia Reading, MD;  Location: [facility];  Service: ENT;  Laterality: N/A;   ERCP N/A 07/12/2017    Procedure: ENDOSCOPIC RETROGRADE CHOLANGIOPANCREATOGRAPHY (ERCP);  Surgeon: Midge Minium, MD;  Location: [facility] END[facility]COPY;  Service: Endoscopy;  Laterality: N/A;   ERCP N/A 10/04/2017    Procedure: ENDOSCOPIC RETROGRADE CHOLANGIOPANCREATOGRAPHY (ERCP);  Surgeon: Midge Minium, MD;  Location: [facility] ENDOSCOPY;  Service:  Endoscopy;  Laterality: N/A;   FOOT SURGERY        x 2   INCISIONAL HERNIA REPAIR N/A 12/04/2020    Procedure: VENTRAL INCISIONAL HERNIA REPAIR WITH MESH;  Surgeon: Darnell Level, MD;  Location: [facility]S;  Service: General;  Laterality: N/A;   MELANOMA EXCISION Left 2000   PALATOPLASTY N/A 04/08/2015    Procedure: PALATOPLASTY;  Surgeon: Linus Salmons, MD;  Location: [facility];  Service: ENT;  Laterality: N/A;   TONSILLECTOMY N/A 04/08/2015    Procedure: TONSILLECTOMY;  Surgeon: Linus Salmons, MD;  Location: [facility] ORS;  Service: ENT;  Laterality: N/A;   TUBAL LIGATION          Current Medications: Active Medications      Current Meds  Medication Sig   ALPRAZolam (XANAX) 0.25 MG tablet Take 1 tablet (0.25 mg total) by mouth 2 (two) times daily as needed for anxiety.   amLODipine (NORVASC) 2.5 MG tablet TAKE ONE TABLET (2.5 MG) BY MOUTH EVERY DAY   apixaban (ELIQUIS) 5 MG TABS tablet Take 1 tablet (5 mg total) by mouth 2 (two) times daily.   benzonatate (TESSALON) 100 MG capsule Take 2 capsules (200 mg total) by mouth 3 (three) times daily as needed for cough.   Cholecalciferol (VITAMIN D3) 75 MCG (3000 UT) TABS Take 6,000 Units by mouth daily.   citalopram (CELEXA) 20 MG tablet TAKE 1 TABLET BY MOUTH DAILY   clobetasol (TEMOVATE) 0.05 % external solution APPLY DAILY UNTIL RASH IS CLEAR   Coenzyme Q10 100 MG capsule Take 100 mg by mouth daily.   melatonin 5 MG TABS Take 5 mg by mouth at bedtime.   metoprolol succinate (TOPROL-XL) 50 MG 24 hr tablet Take 1 tablet (50 mg total) by mouth daily.   MOUNJARO 7.5 MG/0.5ML Pen INJECT 7.5MG  INTO THE SKIN ONCE A WEEK AS DIRECTED   omeprazole (PRILOSEC) 40 MG capsule TAKE 1 CAPSULE BY MOUTH ONCE DAILY   ondansetron (ZOFRAN) 4 MG tablet TAKE 1 TABLET BY MOUTH EVERY 8 HOURS AS NEEDED FOR NAUSEA AND VOMITING   rosuvastatin (CRESTOR) 10 MG tablet TAKE ONE TABLET BY MOUTH EVERY DAY   tiZANidine (ZANAFLEX) 4 MG tablet Take 1 tablet (4 mg total) by mouth  every 6 (six) hours as needed for muscle spasms.        Allergies:   Morphine and related, Tramadol, and Tape    Social History         Socioeconomic History   Marital status: Married      Spouse name: Not on file   Number of children: Not on file   Years of education: 20 , PhD   Highest education level: Not on file  Occupational History   Occupation: Research scientist (medical)  Tobacco  Use   Smoking status: Never   Smokeless tobacco: Never   Tobacco comments:      smoked socially in college  Vaping Use   Vaping Use: Never used  Substance and Sexual Activity   Alcohol use: Yes      Alcohol/week: 0.0 standard drinks of alcohol      Comment: occ - may have 1 glass wine/month   Drug use: No   Sexual activity: Not on file  Other Topics Concern   Not on file  Social History Narrative   Not on file    Social Determinants of Health    Financial Resource Strain: Not on file  Food Insecurity: Not on file  Transportation Needs: Not on file  Physical Activity: Not on file  Stress: Not on file  Social Connections: Not on file      Family History: The patient's family history includes Cancer (age of onset: 3) in her sister. There is no history of Breast cancer.   ROS:   Please see the history of present illness.    All other systems reviewed and are negative.   EKGs/Labs/Other Studies Reviewed:     The following studies were reviewed today:   January 2023 echo EF 60 RV normal No MR   January 2023 ZIO monitor 26% burden of atrial fibrillation         Recent Labs: 03/31/2022: ALT 24; BUN 22; Creatinine, Ser 1.12; Hemoglobin 13.5; Magnesium 2.0; Platelets 244; Potassium 4.3; Sodium 141  Recent Lipid Panel Labs (Brief)          Component Value Date/Time    CHOL 140 10/09/2021 1454    TRIG 117 10/09/2021 1454    HDL 48 (L) 10/09/2021 1454    CHOLHDL 2.9 10/09/2021 1454    VLDL 29.8 12/10/2019 1439    LDLCALC 72 10/09/2021 1454        Physical Exam:     VS:  BP  150/92   Pulse 60  Ht 5\' 2"  (1.575 m)   Wt 147 lb (66.7 kg)   SpO2 97%   BMI 26.89 kg/m         Wt Readings from Last 3 Encounters:  05/12/22 147 lb (66.7 kg)  04/12/22 152 lb 6 oz (69.1 kg)  03/31/22 143 lb (64.9 kg)      GEN:  Well nourished, well developed in no acute distress HEENT: Normal NECK: No JVD; No carotid bruits LYMPHATICS: No lymphadenopathy CARDIAC: RRR, no murmurs, rubs, gallops RESPIRATORY:  Clear to auscultation without rales, wheezing or rhonchi  ABDOMEN: Soft, non-tender, non-distended MUSCULOSKELETAL:  No edema; No deformity  SKIN: Warm and dry NEUROLOGIC:  Alert and oriented x 3 PSYCHIATRIC:  Normal affect          Assessment ASSESSMENT:     1. Paroxysmal atrial fibrillation (HCC)   2. Primary hypertension   3. Hallucination     PLAN:     In order of problems listed above:   #Paroxysmal atrial fibrillation Symptomatic. Did not tolerate flecainide.  Now takes Toprol XL once daily.   On Eliquis for stroke prophylaxis I discussed treatment options including alternative antiarrhythmic drugs and catheter ablation.  I do think she is a good candidate for catheter ablation.   Discussed treatment options today for their AF including antiarrhythmic drug therapy and ablation. Discussed risks, recovery and likelihood of success. Discussed potential need for repeat ablation procedures and antiarrhythmic drugs after an initial ablation. They wish to proceed with scheduling.   Risk,  benefits, and alternatives to EP study and radiofrequency ablation for afib were also discussed in detail today. These risks include but are not limited to stroke, bleeding, vascular damage, tamponade, perforation, damage to the esophagus, lungs, and other structures, pulmonary vein stenosis, worsening renal function, and death. The patient understands these risk and wishes to proceed.  We will therefore proceed with catheter ablation at the next available time.  Carto, ICE,  anesthesia are requested for the procedure.  Will also obtain CT PV protocol prior to the procedure to exclude LAA thrombus and further evaluate atrial anatomy.   #Visual hallucinations I would like these to be evaluated in parallel with her atrial fibrillation.  Unclear what could be causing these-medication side effect versus dementia variant versus related to his sleep disturbance?  I will put a referral in for a neurologist to further evaluate.   #Hypertension At goal today.  Recommend checking blood pressures 1-2 times per week at home and recording the values.  Recommend bringing these recordings to the primary care physician.        Plan for PVI today.   Signed, Rossie Muskrat. Lalla Brothers, MD, [facility], [facility] 09/09/2022 Electrophysiology [facility]

## 2022-09-09 NOTE — Transfer of Care (Signed)
Immediate Anesthesia Transfer of Care Note  Patient: Kathy Long  Procedure(s) Performed: ATRIAL FIBRILLATION ABLATION  Patient Location: Cath Lab  Anesthesia Type:General  Level of Consciousness: awake and alert   Airway & Oxygen Therapy: Patient Spontanous Breathing and Patient connected to nasal cannula oxygen  Post-op Assessment: Report given to RN and Post -op Vital signs reviewed and stable  Post vital signs: Reviewed and stable  Last Vitals:  Vitals Value Taken Time  BP 136/75 09/09/22 1533  Temp    Pulse 66 09/09/22 1540  Resp 17 09/09/22 1540  SpO2 96 % 09/09/22 1540  Vitals shown include unvalidated device data.  Last Pain:  Vitals:   09/09/22 1141  TempSrc: Oral  PainSc: 0-No pain         Complications: There were no known notable events for this encounter.

## 2022-09-10 ENCOUNTER — Encounter (HOSPITAL_COMMUNITY): Payer: Self-pay | Admitting: Cardiology

## 2022-09-12 NOTE — Anesthesia Postprocedure Evaluation (Signed)
Anesthesia Post Note  Patient: Kathy Long  Procedure(s) Performed: ATRIAL FIBRILLATION ABLATION     Patient location during evaluation: Cath Lab Anesthesia Type: General Level of consciousness: sedated and patient cooperative Pain management: pain level controlled Vital Signs Assessment: post-procedure vital signs reviewed and stable Respiratory status: spontaneous breathing Cardiovascular status: stable Anesthetic complications: no   There were no known notable events for this encounter.  Last Vitals:  Vitals:   09/09/22 1700 09/09/22 1800  BP: (!) 150/80 (!) 147/88  Pulse: 70 69  Resp: 16 (!) 24  Temp:    SpO2: 96% 96%    Last Pain:  Vitals:   09/09/22 1800  TempSrc:   PainSc: 0-No pain                 Lewie Loron

## 2022-09-13 ENCOUNTER — Telehealth: Payer: Self-pay | Admitting: Pharmacy Technician

## 2022-09-13 ENCOUNTER — Encounter: Payer: Self-pay | Admitting: Internal Medicine

## 2022-09-13 NOTE — Telephone Encounter (Signed)
Patient Advocate Encounter   Received notification that prior authorization for Wegovy 0.5MG /0.5ML auto-injectors is required.   PA submitted on 09/13/2022 [employer] Electronic PA Form Status is pending

## 2022-09-14 MED ORDER — ZEPBOUND 5 MG/0.5ML ~~LOC~~ SOAJ: 5.0000 mg | SUBCUTANEOUS | 0 refills | Status: DC

## 2022-09-16 ENCOUNTER — Encounter: Payer: Self-pay | Admitting: Cardiology

## 2022-09-17 NOTE — Telephone Encounter (Signed)
Spoke with patient to schedule F/U with Afib clinic. Patient is scheduled for 09/21/22 at 08:30 am.

## 2022-09-18 ENCOUNTER — Encounter: Payer: Self-pay | Admitting: Internal Medicine

## 2022-09-20 MED ORDER — ZEPBOUND 5 MG/0.5ML ~~LOC~~ SOAJ: 5.0000 mg | SUBCUTANEOUS | 0 refills | Status: DC

## 2022-09-20 MED ORDER — WEGOVY 0.5 MG/0.5ML ~~LOC~~ SOAJ: 0.5000 mg | SUBCUTANEOUS | 2 refills | Status: DC

## 2022-09-20 NOTE — Telephone Encounter (Signed)
PA Denied

## 2022-09-20 NOTE — Telephone Encounter (Signed)
Pt is aware.  

## 2022-09-21 ENCOUNTER — Ambulatory Visit (HOSPITAL_COMMUNITY)
Admission: RE | Admit: 2022-09-21 | Discharge: 2022-09-21 | Disposition: A | Discharge status: Home / Self Care | Payer: Medicare PPO | Source: Ambulatory Visit | Attending: Physician Assistant | Admitting: Physician Assistant

## 2022-09-21 ENCOUNTER — Encounter (HOSPITAL_COMMUNITY): Payer: Self-pay | Admitting: Physician Assistant

## 2022-09-21 VITALS — BP 136/88 | HR 56 | Ht 62.0 in | Wt 156.6 lb

## 2022-09-21 DIAGNOSIS — D6859 Other primary thrombophilia: Secondary | ICD-10-CM | POA: Diagnosis not present

## 2022-09-21 DIAGNOSIS — Z79899 Other long term (current) drug therapy: Secondary | ICD-10-CM | POA: Diagnosis not present

## 2022-09-21 DIAGNOSIS — E785 Hyperlipidemia, unspecified: Secondary | ICD-10-CM | POA: Diagnosis not present

## 2022-09-21 DIAGNOSIS — G4733 Obstructive sleep apnea (adult) (pediatric): Secondary | ICD-10-CM | POA: Insufficient documentation

## 2022-09-21 DIAGNOSIS — I700 Atherosclerosis of aorta: Secondary | ICD-10-CM | POA: Insufficient documentation

## 2022-09-21 DIAGNOSIS — Z7901 Long term (current) use of anticoagulants: Secondary | ICD-10-CM | POA: Diagnosis not present

## 2022-09-21 DIAGNOSIS — I480 Paroxysmal atrial fibrillation: Secondary | ICD-10-CM

## 2022-09-21 DIAGNOSIS — I10 Essential (primary) hypertension: Secondary | ICD-10-CM | POA: Diagnosis not present

## 2022-09-21 DIAGNOSIS — D6869 Other thrombophilia: Secondary | ICD-10-CM

## 2022-09-21 HISTORY — DX: Paroxysmal atrial fibrillation: I48.0

## 2022-09-21 HISTORY — DX: Other thrombophilia: D68.69

## 2022-09-21 NOTE — Progress Notes (Signed)
Primary Care Physician: Sherlene Shams, MD Primary Cardiologist: Dr Mariah Milling  Primary Electrophysiologist: Dr Lalla Brothers Referring Physician: Dr Conan Bowens is a 72 y.o. female with a history of HTN, OSA, HLD, aortic atherosclerosis, atrial fibrillation who presents for follow up in the [facility]. She was seen in the emergency department November 1 with near syncope associated with atrial fibrillation episodes. She saw Dr. Mariah Milling in follow-up on April 09, 2022. She wears an Scientist, physiological which demonstrated a burden of atrial fibrillation of at least 16%.  Patient is on Eliquis for a CHADS2VASC score of 4. She underwent afib ablation with Dr Lalla Brothers on 09/09/22.  On follow up today, patient has not had any episodes of afib detected on her smart watch since the ablation. She did have some groin tenderness last week. No redness, swelling, or oozing. She has taken tylenol with minimal relief. However, over the past two days her pain has greatly improved. She denies chest pain or swallowing pain.   Today, she denies symptoms of palpitations, chest pain, shortness of breath, orthopnea, PND, lower extremity edema, dizziness, presyncope, syncope, snoring, daytime somnolence, bleeding, or neurologic sequela. The patient is tolerating medications without difficulties and is otherwise without complaint today.    Atrial Fibrillation Risk Factors:  she does have symptoms or diagnosis of sleep apnea. she is not compliant with CPAP therapy. she does not have a history of rheumatic fever.   she has a BMI of Body mass index is 28.64 kg/m.Marland Kitchen Filed Weights   09/21/22 0830  Weight: 71 kg    Family History  Problem Relation Age of Onset   Cancer Sister 21       ovarian ca   Breast cancer Other      Atrial Fibrillation Management history:  Previous antiarrhythmic drugs: flecainide  Previous cardioversions: none Previous ablations: 09/09/22 Anticoagulation  history: Eliquis   Past Medical History:  Diagnosis Date   Acoustic neuroma    left ear   Anxiety    Arthritis    lower back, hands   Cancer    melanoma, shoulder   Chronic kidney disease    Closed fracture of distal lateral malleolus of left ankle 12/28/2017   Depression    GERD (gastroesophageal reflux disease)    Hyperlipidemia    Hypertension    Postoperative bile leak    Sleep apnea    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    Past Surgical History:  Procedure Laterality Date   ANKLE FRACTURE SURGERY Left    APPENDECTOMY  2010   ATRIAL FIBRILLATION ABLATION N/A 09/09/2022   Procedure: ATRIAL FIBRILLATION ABLATION;  Surgeon: Lanier Prude, MD;  [facility];  Service: Cardiovascular;  Laterality: N/A;   BACK SURGERY     disectomy lumbar, scar tissue   BREAST CYST EXCISION Right 1970   axilla   buninectomy Bilateral    CATARACT EXTRACTION W/PHACO Left 04/12/2018   Procedure: CATARACT EXTRACTION PHACO AND INTRAOCULAR LENS PLACEMENT (IOC)  LEFT TORIC LENS;  Surgeon: Lockie Mola, MD;  Location: [facility];  Service: Ophthalmology;  Laterality: Left;   CATARACT EXTRACTION W/PHACO Right 05/10/2018   Procedure: CATARACT EXTRACTION PHACO AND INTRAOCULAR LENS PLACEMENT (IOC)  RIGHT TORIC LENS;  Surgeon: Lockie Mola, MD;  Location: [facility];  Service: Ophthalmology;  Laterality: Right;  DIABETIC   CHOLECYSTECTOMY N/A 07/05/2017   Procedure: LAPAROSCOPIC CHOLECYSTECTOMY WITH INTRAOPERATIVE CHOLANGIOGRAM;  Surgeon: Earline Mayotte, MD;  Location: [facility] ORS;  Service: General;  Laterality: N/A;   COLONOSCOPY     2009 and color gaurd in 2018   COLONOSCOPY N/A 03/25/2020   Procedure: COLONOSCOPY;  Surgeon: Regis Bill, MD;  Location: [facility];  Service: Endoscopy;  Laterality: N/A;   DRUG INDUCED ENDOSCOPY N/A 05/06/2021   Procedure: DRUG INDUCED SLEEP ENDOSCOPY;  Surgeon: Christia Reading, MD;  Location: [facility];  Service: ENT;  Laterality: N/A;   ERCP N/A 07/12/2017   Procedure: ENDOSCOPIC RETROGRADE CHOLANGIOPANCREATOGRAPHY (ERCP);  Surgeon: Midge Minium, MD;  Location: [facility];  Service: Endoscopy;  Laterality: N/A;   ERCP N/A 10/04/2017   Procedure: ENDOSCOPIC RETROGRADE CHOLANGIOPANCREATOGRAPHY (ERCP);  Surgeon: Midge Minium, MD;  Location: [facility];  Service: Endoscopy;  Laterality: N/A;   FOOT SURGERY     x 2   INCISIONAL HERNIA REPAIR N/A 12/04/2020   Procedure: VENTRAL INCISIONAL HERNIA REPAIR WITH MESH;  Surgeon: Darnell Level, MD;  Location: [facility];  Service: General;  Laterality: N/A;   MELANOMA EXCISION Left 2000   PALATOPLASTY N/A 04/08/2015   Procedure: PALATOPLASTY;  Surgeon: Linus Salmons, MD;  Location: [facility];  Service: ENT;  Laterality: N/A;   TONSILLECTOMY N/A 04/08/2015   Procedure: TONSILLECTOMY;  Surgeon: Linus Salmons, MD;  Location: [facility];  Service: ENT;  Laterality: N/A;   TUBAL LIGATION      Current Outpatient Medications  Medication Sig Dispense Refill   ALPRAZolam (XANAX) 0.25 MG tablet Take 1 tablet (0.25 mg total) by mouth at bedtime. 30 tablet 5   amLODipine (NORVASC) 2.5 MG tablet TAKE ONE TABLET (2.5 MG) BY MOUTH EVERY DAY 90 tablet 1   benzonatate (TESSALON) 100 MG capsule Take 2 capsules (200 mg total) by mouth 3 (three) times daily as needed for cough. 30 capsule 0   Cholecalciferol 125 MCG (5000 UT) TABS Take 5,000 Units by mouth in the morning.     citalopram (CELEXA) 20 MG tablet Take 1 tablet (20 mg total) by mouth daily. 90 tablet 1   clobetasol (TEMOVATE) 0.05 % external solution APPLY DAILY UNTIL RASH IS CLEAR (Patient taking differently: Apply 1 Application topically daily as needed (Eczema).) 50 mL 0   Coenzyme Q10 100 MG capsule Take 100 mg by mouth daily.     diphenhydrAMINE (BENADRYL) 25 mg capsule Take 1-2 capsules (25-50 mg total) by mouth at bedtime as needed for allergies. 20 capsule 0   ELIQUIS 5 MG TABS tablet TAKE 1  TABLET BY MOUTH TWICE DAILY 180 tablet 3   guaiFENesin (MUCINEX) 600 MG 12 hr tablet Take 2 tablets (1,200 mg total) by mouth 2 (two) times daily. 20 tablet 0   metoprolol succinate (TOPROL-XL) 50 MG 24 hr tablet TAKE 1 TABLET BY MOUTH DAILY (Patient taking differently: Take 25 mg by mouth at bedtime.) 90 tablet 1   ondansetron (ZOFRAN) 4 MG tablet TAKE 1 TABLET BY MOUTH EVERY 8 HOURS AS NEEDED FOR NAUSEA AND VOMITING 20 tablet 0   pantoprazole (PROTONIX) 40 MG tablet Take 1 tablet (40 mg total) by mouth daily. 45 tablet 0   rosuvastatin (CRESTOR) 10 MG tablet Take 1 tablet (10 mg total) by mouth daily. 90 tablet 1   Semaglutide-Weight Management (WEGOVY) 0.5 MG/0.5ML SOAJ Inject 0.5 mg into the skin once a week. 2 mL 2   tirzepatide (ZEPBOUND) 5 MG/0.5ML Pen Inject 5 mg into the skin once a week. 6 mL 0   colchicine 0.6 MG tablet Take 1 tablet (0.6 mg total) by mouth  2 (two) times daily for 5 days. 10 tablet 0   No current facility-administered medications for this encounter.    Allergies  Allergen Reactions   Morphine And Related Other (See Comments)    Chest pain   Tramadol Other (See Comments)    tremors   Tape Rash     Surgical tape    Social History   Socioeconomic History   Marital status: Married    Spouse name: Not on file   Number of children: Not on file   Years of education: 20 , PhD   Highest education level: Not on file  Occupational History   Occupation: Research scientist (medical)  Tobacco Use   Smoking status: Never   Smokeless tobacco: Never   Tobacco comments:    smoked socially in college  Vaping Use   Vaping Use: Never used  Substance and Sexual Activity   Alcohol use: Yes    Alcohol/week: 0.0 standard drinks of alcohol    Comment: occ - may have 1 glass wine/month   Drug use: No   Sexual activity: Not on file  Other Topics Concern   Not on file  Social History Narrative   Left handed   Occasionally caffeine   Lives in [place]    Retired   Attends church    Social Determinants of Corporate investment banker Strain: Not on file  Food Insecurity: Not on file  Transportation Needs: Not on file  Physical Activity: Not on file  Stress: Not on file  Social Connections: Not on file  Intimate Partner Violence: Not on file     ROS- All systems are reviewed and negative except as per the HPI above.  Physical Exam: Vitals:   09/21/22 0830  BP: 136/88  Pulse: (!) 56  Weight: 71 kg  Height: 5\' 2"  (1.575 m)    GEN- The patient is a well appearing female, alert and oriented x 3 today.   Head- normocephalic, atraumatic Eyes-  Sclera clear, conjunctiva pink Ears- hearing intact Oropharynx- clear Neck- supple  Lungs- Clear to ausculation bilaterally, normal work of breathing Heart- Regular rate and rhythm, no murmurs, rubs or gallops  GI- soft, NT, ND, + BS Extremities- no clubbing, cyanosis, or edema MS- no significant deformity or atrophy Skin- no rash or lesion Psych- euthymic mood, full affect Neuro- strength and sensation are intact  Wt Readings from Last 3 Encounters:  09/21/22 71 kg  09/09/22 68 kg  08/26/22 70.5 kg    EKG today demonstrates  SB, slow R wave prog Vent. rate 56 BPM PR interval 152 ms QRS duration 74 ms QT/QTcB 460/443 ms  Echo 06/23/21 demonstrated  1. Left ventricular ejection fraction, by estimation, is 60 to 65%. The  left ventricle has normal function. The left ventricle has no regional  wall motion abnormalities. Left ventricular diastolic parameters are  consistent with Grade I diastolic dysfunction (impaired relaxation).   2. Right ventricular systolic function is normal. The right ventricular  size is normal. There is normal pulmonary artery systolic pressure. The  estimated right ventricular systolic pressure is 28.4 mmHg.   3. The mitral valve is normal in structure. No evidence of mitral valve  regurgitation. No evidence of mitral stenosis.   4. The aortic valve is normal in structure.  Aortic valve regurgitation is  not visualized. No aortic stenosis is present.   5. There is borderline dilatation of the ascending aorta, measuring 38  mm.   6. The inferior vena cava is normal in  size with greater than 50%  respiratory variability, suggesting right atrial pressure of 3 mmHg.   Epic records are reviewed at length today.  CHA2DS2-VASc Score = 4  The patient's score is based upon: CHF History: 0 HTN History: 1 Diabetes History: 0 Stroke History: 0 Vascular Disease History: 1 (aortic atherosclerosis) Age Score: 1 Gender Score: 1       ASSESSMENT AND PLAN: 1. Paroxysmal Atrial Fibrillation (ICD10:  I48.0) The patient's CHA2DS2-VASc score is 4, indicating a 4.8% annual risk of stroke.   S/p afib ablation 09/09/22 Patient appears to be maintaining SR.  Continue Eliquis 5 mg BID with no missed doses for 3 months post ablation. Continue Toprol 25 mg daily Apple Watch for home monitoring.   2. Secondary Hypercoagulable State (ICD10:  D68.69) The patient is at significant risk for stroke/thromboembolism based upon her CHA2DS2-VASc Score of 4.  Continue Apixaban (Eliquis).   3. Obstructive sleep apnea The importance of adequate treatment of sleep apnea was discussed today in order to improve our ability to maintain sinus rhythm long term. Patient looking into oral appliance or Inspire. She did not tolerate CPAP.  4. HTN Stable, no changes today.   Follow up with Dr Lalla Brothers as scheduled.    Jorja Loa PA-C [facility] [address] [place], [place] [address] [phone] 09/21/2022 8:54 AM

## 2022-09-24 ENCOUNTER — Encounter (HOSPITAL_COMMUNITY): Payer: Self-pay

## 2022-09-24 ENCOUNTER — Ambulatory Visit (HOSPITAL_COMMUNITY): Payer: Medicare PPO | Admitting: Physician Assistant

## 2022-09-24 NOTE — Telephone Encounter (Signed)
Pt called back to let Dr. Darrick Huntsman knows that the [facility] outpatient pharmacy in [place] has Kathy Long 10 mg. And she was wondering if Dr.Tullo can send over the xr, so she can pick it up??

## 2022-09-27 ENCOUNTER — Other Ambulatory Visit (HOSPITAL_COMMUNITY): Payer: Self-pay

## 2022-09-28 ENCOUNTER — Other Ambulatory Visit (HOSPITAL_COMMUNITY): Payer: Self-pay

## 2022-09-28 DIAGNOSIS — L820 Inflamed seborrheic keratosis: Secondary | ICD-10-CM | POA: Diagnosis not present

## 2022-09-28 DIAGNOSIS — L538 Other specified erythematous conditions: Secondary | ICD-10-CM | POA: Diagnosis not present

## 2022-09-28 DIAGNOSIS — Z85820 Personal history of malignant melanoma of skin: Secondary | ICD-10-CM | POA: Diagnosis not present

## 2022-09-28 DIAGNOSIS — Z08 Encounter for follow-up examination after completed treatment for malignant neoplasm: Secondary | ICD-10-CM | POA: Diagnosis not present

## 2022-09-28 MED ORDER — ZEPBOUND 10 MG/0.5ML ~~LOC~~ SOAJ
10.0000 mg | SUBCUTANEOUS | 5 refills | Status: DC
  Filled 2022-09-28: qty 2, 28d supply, fill #0
  Filled 2022-10-21 – 2022-11-01 (×2): qty 2, 28d supply, fill #1

## 2022-09-28 NOTE — Addendum Note (Signed)
Addended by: Sherlene Shams on: 09/28/2022 06:11 AM   Modules accepted: Orders

## 2022-09-29 DIAGNOSIS — G4733 Obstructive sleep apnea (adult) (pediatric): Secondary | ICD-10-CM | POA: Diagnosis not present

## 2022-09-30 DIAGNOSIS — D225 Melanocytic nevi of trunk: Secondary | ICD-10-CM | POA: Diagnosis not present

## 2022-09-30 DIAGNOSIS — D2261 Melanocytic nevi of right upper limb, including shoulder: Secondary | ICD-10-CM | POA: Diagnosis not present

## 2022-09-30 DIAGNOSIS — Z85820 Personal history of malignant melanoma of skin: Secondary | ICD-10-CM | POA: Diagnosis not present

## 2022-09-30 DIAGNOSIS — L821 Other seborrheic keratosis: Secondary | ICD-10-CM | POA: Diagnosis not present

## 2022-09-30 DIAGNOSIS — D2271 Melanocytic nevi of right lower limb, including hip: Secondary | ICD-10-CM | POA: Diagnosis not present

## 2022-09-30 DIAGNOSIS — D2262 Melanocytic nevi of left upper limb, including shoulder: Secondary | ICD-10-CM | POA: Diagnosis not present

## 2022-09-30 DIAGNOSIS — D2272 Melanocytic nevi of left lower limb, including hip: Secondary | ICD-10-CM | POA: Diagnosis not present

## 2022-09-30 DIAGNOSIS — Z85828 Personal history of other malignant neoplasm of skin: Secondary | ICD-10-CM | POA: Diagnosis not present

## 2022-09-30 DIAGNOSIS — Z08 Encounter for follow-up examination after completed treatment for malignant neoplasm: Secondary | ICD-10-CM | POA: Diagnosis not present

## 2022-09-30 DIAGNOSIS — L739 Follicular disorder, unspecified: Secondary | ICD-10-CM | POA: Diagnosis not present

## 2022-10-06 ENCOUNTER — Encounter: Payer: Self-pay | Admitting: *Deleted

## 2022-10-06 DIAGNOSIS — Z006 Encounter for examination for normal comparison and control in clinical research program: Secondary | ICD-10-CM

## 2022-10-06 NOTE — Research (Signed)
Message left for Ms [facility] about V1p research study. Encouraged her to call with any questions.

## 2022-10-10 NOTE — Progress Notes (Unsigned)
Cardiology Office Note  Date:  10/11/2022   ID:  Kathy Long, DOB 14-Oct-1950, MRN [id number]  PCP:  Sherlene Shams, MD   Chief Complaint  Patient presents with   6 month follow up     "Doing well." Medications reviewed by the patient verbally.     HPI:  Kathy Long is a 72 y.o. female with a hx of  hypertension,  Hyperlipidemia Mild aortic atherosclerosis on CT scan March 2020 who presents due to paroxysmal atrial fibrillation.  Last seen by myself in clinic November 2023 Seen by EP December 2023 As she was symptomatic, did not tolerate flecainide, underwent ablation September 09, 2022 Has done well since her ablation, denies any breakthrough tachypalpitations concerning for atrial fibrillation  Off flecainide Metoprolol down to 25 daily No hallucinations  Active, planning on a trip to [place] in June 2024  Weight loss, OSA mild to moderate Has mouth piece, sleeping all night  Balance still not great  EKG personally reviewed by myself on todays visit Normal sinus rhythm rate 74 bpm nonspecific T wave malady  Near syncope symptoms, emergency room March 31, 2022 Noted to be in atrial fibrillation  Echocardiogram January 2023 Normal ejection fraction, normal left atrial size No significant MR  Prior records reviewed foot surgery for hammertoe correction 06/05/2021  went into atrial fibrillation.   CHA2DS2-VASc score 3(age, htn, gender). Seen by cardiology Xarelto was increased up to 20 June 08, 2021  Echocardiogram ejection fraction 60 to 65%, grade 1 diastolic dysfunction, normal right heart pressures  Zio reviewed Paroxysmal atrial fibrillation noted on cardiac monitor.  26% burden,  Patient had a min HR of 42 bpm, max HR of 176 bpm, and avg HR of 70 bpm. Predominant underlying rhythm was Sinus Rhythm.   1 run of Ventricular Tachycardia occurred lasting 18 beats with a max rate of 162 bpm (avg 152 bpm).   21 Supraventricular Tachycardia runs occurred,  the run with the fastest interval lasting 9 beats with a max rate of 139 bpm, the longest lasting 14 beats with an avg rate of 109 bpm.   Atrial Fibrillation occurred (26% burden), ranging from 57-176 bpm (avg of 89 bpm), the longest lasting 10 hours 21 mins with an avg rate of 84 bpm.  PMH:   has a past medical history of Acoustic neuroma (HCC), Anxiety, Arthritis, Cancer (HCC), Chronic kidney disease, Closed fracture of distal lateral malleolus of left ankle (12/28/2017), Depression, GERD (gastroesophageal reflux disease), Hyperlipidemia, Hypertension, Postoperative bile leak, Sleep apnea, and 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.  PSH:    Past Surgical History:  Procedure Laterality Date   ANKLE FRACTURE SURGERY Left    APPENDECTOMY  2010   ATRIAL FIBRILLATION ABLATION N/A 09/09/2022   Procedure: ATRIAL FIBRILLATION ABLATION;  Surgeon: Lanier Prude, MD;  Location: [facility];  Service: Cardiovascular;  Laterality: N/A;   BACK SURGERY     disectomy lumbar, scar tissue   BREAST CYST EXCISION Right 1970   axilla   buninectomy Bilateral    CATARACT EXTRACTION W/PHACO Left 04/12/2018   Procedure: CATARACT EXTRACTION PHACO AND INTRAOCULAR LENS PLACEMENT (IOC)  LEFT TORIC LENS;  Surgeon: Lockie Mola, MD;  Location: [facility];  Service: Ophthalmology;  Laterality: Left;   CATARACT EXTRACTION W/PHACO Right 05/10/2018   Procedure: CATARACT EXTRACTION PHACO AND INTRAOCULAR LENS PLACEMENT (IOC)  RIGHT TORIC LENS;  Surgeon: Lockie Mola, MD;  Location: [facility];  Service: Ophthalmology;  Laterality: Right;  DIABETIC  CHOLECYSTECTOMY N/A 07/05/2017   Procedure: LAPAROSCOPIC CHOLECYSTECTOMY WITH INTRAOPERATIVE CHOLANGIOGRAM;  Surgeon: Earline Mayotte, MD;  Location: [facility];  Service: General;  Laterality: N/A;   COLONOSCOPY     2009 and color gaurd in 2018   COLONOSCOPY N/A 03/25/2020   Procedure: COLONOSCOPY;  Surgeon: Regis Bill, MD;   Location: [facility] END[facility];  Service: Endoscopy;  Laterality: N/A;   DRUG INDUCED ENDOSCOPY N/A 05/06/2021   Procedure: DRUG INDUCED SLEEP ENDOSCOPY;  Surgeon: Christia Reading, MD;  Location: [facility];  Service: ENT;  Laterality: N/A;   ERCP N/A 07/12/2017   Procedure: ENDOSCOPIC RETROGRADE CHOLANGIOPANCREATOGRAPHY (ERCP);  Surgeon: Midge Minium, MD;  Location: [facility];  Service: Endoscopy;  Laterality: N/A;   ERCP N/A 10/04/2017   Procedure: ENDOSCOPIC RETROGRADE CHOLANGIOPANCREATOGRAPHY (ERCP);  Surgeon: Midge Minium, MD;  Location: [facility] END[facility];  Service: Endoscopy;  Laterality: N/A;   FOOT SURGERY     x 2   INCISIONAL HERNIA REPAIR N/A 12/04/2020   Procedure: VENTRAL INCISIONAL HERNIA REPAIR WITH MESH;  Surgeon: Darnell Level, MD;  Location: [facility];  Service: General;  Laterality: N/A;   MELANOMA EXCISION Left 2000   PALATOPLASTY N/A 04/08/2015   Procedure: PALATOPLASTY;  Surgeon: Linus Salmons, MD;  Location: [facility];  Service: ENT;  Laterality: N/A;   TONSILLECTOMY N/A 04/08/2015   Procedure: TONSILLECTOMY;  Surgeon: Linus Salmons, MD;  Location: [facility];  Service: ENT;  Laterality: N/A;   TUBAL LIGATION      Current Outpatient Medications  Medication Sig Dispense Refill   ALPRAZolam (XANAX) 0.25 MG tablet Take 1 tablet (0.25 mg total) by mouth at bedtime. 30 tablet 5   amLODipine (NORVASC) 2.5 MG tablet TAKE ONE TABLET (2.5 MG) BY MOUTH EVERY DAY 90 tablet 1   benzonatate (TESSALON) 100 MG capsule Take 2 capsules (200 mg total) by mouth 3 (three) times daily as needed for cough. 30 capsule 0   Cholecalciferol 125 MCG (5000 UT) TABS Take 5,000 Units by mouth in the morning.     citalopram (CELEXA) 20 MG tablet Take 1 tablet (20 mg total) by mouth daily. 90 tablet 1   clobetasol (TEMOVATE) 0.05 % external solution APPLY DAILY UNTIL RASH IS CLEAR (Patient taking differently: Apply 1 Application topically daily as needed (Eczema).) 50 mL 0   ELIQUIS 5 MG TABS  tablet TAKE 1 TABLET BY MOUTH TWICE DAILY 180 tablet 3   Famotidine (ACID CONTROLLER PO) Take by mouth daily.     metoprolol succinate (TOPROL-XL) 50 MG 24 hr tablet Take 25 mg by mouth daily. Take with or immediately following a meal.     ondansetron (ZOFRAN) 4 MG tablet TAKE 1 TABLET BY MOUTH EVERY 8 HOURS AS NEEDED FOR NAUSEA AND VOMITING 20 tablet 0   rosuvastatin (CRESTOR) 10 MG tablet Take 1 tablet (10 mg total) by mouth daily. 90 tablet 1   tirzepatide (ZEPBOUND) 10 MG/0.5ML Pen Inject 10 mg into the skin once a week. 2 mL 5   Coenzyme Q10 100 MG capsule Take 100 mg by mouth daily. (Patient not taking: Reported on 10/11/2022)     colchicine 0.6 MG tablet Take 1 tablet (0.6 mg total) by mouth 2 (two) times daily for 5 days. (Patient not taking: Reported on 10/11/2022) 10 tablet 0   diphenhydrAMINE (BENADRYL) 25 mg capsule Take 1-2 capsules (25-50 mg total) by mouth at bedtime as needed for allergies. (Patient not taking: Reported on 10/11/2022) 20 capsule 0   guaiFENesin (MUCINEX) 600 MG 12 hr tablet  Take 2 tablets (1,200 mg total) by mouth 2 (two) times daily. (Patient not taking: Reported on 10/11/2022) 20 tablet 0   pantoprazole (PROTONIX) 40 MG tablet Take 1 tablet (40 mg total) by mouth daily. (Patient not taking: Reported on 10/11/2022) 45 tablet 0   No current facility-administered medications for this visit.    Allergies:   Morphine and related, Tramadol, and Tape   Social History:  The patient  reports that she has never smoked. She has never used smokeless tobacco. She reports current alcohol use. She reports that she does not use drugs.   Family History:   family history includes Breast cancer in an other family member; Cancer (age of onset: 33) in her sister.    Review of Systems: Review of Systems  Constitutional: Negative.   HENT: Negative.    Respiratory: Negative.    Cardiovascular: Negative.   Gastrointestinal: Negative.   Musculoskeletal: Negative.   Neurological:  Negative.   Psychiatric/Behavioral: Negative.    All other systems reviewed and are negative.  PHYSICAL EXAM: VS:  BP 100/72 (BP Location: Left Arm, Patient Position: Sitting, Cuff Size: Normal)   Pulse 74   Ht 5\' 2"  (1.575 m)   Wt 151 lb (68.5 kg)   SpO2 98%   BMI 27.62 kg/m  , BMI Body mass index is 27.62 kg/m. Constitutional:  oriented to person, place, and time. No distress.  HENT:  Head: Grossly normal Eyes:  no discharge. No scleral icterus.  Neck: No JVD, no carotid bruits  Cardiovascular: Regular rate and rhythm, no murmurs appreciated Pulmonary/Chest: Clear to auscultation bilaterally, no wheezes or rails Abdominal: Soft.  no distension.  no tenderness.  Musculoskeletal: Normal range of motion Neurological:  normal muscle tone. Coordination normal. No atrophy Skin: Skin warm and dry Psychiatric: normal affect, pleasant  Recent Labs: 03/31/2022: Magnesium 2.0 05/26/2022: TSH 1.25 08/13/2022: ALT 26 08/19/2022: Hemoglobin 13.4; Platelets 198 09/09/2022: BUN 18; Creatinine, Ser 1.19; Potassium 3.6; Sodium 142    Lipid Panel Lab Results  Component Value Date   CHOL 178 08/13/2022   HDL 47.00 08/13/2022   LDLCALC 105 (H) 08/13/2022   TRIG 130.0 08/13/2022      Wt Readings from Last 3 Encounters:  10/11/22 151 lb (68.5 kg)  09/21/22 156 lb 9.6 oz (71 kg)  09/09/22 150 lb (68 kg)     ASSESSMENT AND PLAN:  Problem List Items Addressed This Visit       Cardiology Problems   Aortic atherosclerosis (HCC)   Relevant Medications   metoprolol succinate (TOPROL-XL) 50 MG 24 hr tablet   Paroxysmal atrial fibrillation (HCC) - Primary   Relevant Medications   metoprolol succinate (TOPROL-XL) 50 MG 24 hr tablet     Other   OSA (obstructive sleep apnea)   Other Visit Diagnoses     Primary hypertension       Relevant Medications   metoprolol succinate (TOPROL-XL) 50 MG 24 hr tablet   Pure hypercholesterolemia       Relevant Medications   metoprolol succinate  (TOPROL-XL) 50 MG 24 hr tablet     Paroxysmal atrial fibrillation Managed by EP, underwent ablation this past month, reports feeling well no breakthrough arrhythmia Continues on metoprolol and Eliquis  Essential hypertension Blood pressure is well controlled on today's visit. No changes made to the medications.  Hyperlipidemia Reasonable cholesterol Crestor 10, mild aortic atherosclerosis on CT scan   Total encounter time more than 30 minutes  Greater than 50% was spent in counseling and coordination  of care with the patient    Signed, Esmond Plants, M.D., Ph.D. [place], [place]

## 2022-10-11 ENCOUNTER — Ambulatory Visit (INDEPENDENT_AMBULATORY_CARE_PROVIDER_SITE_OTHER): Payer: Medicare PPO

## 2022-10-11 ENCOUNTER — Encounter: Payer: Self-pay | Admitting: Cardiovascular Disease

## 2022-10-11 ENCOUNTER — Ambulatory Visit: Payer: Medicare PPO | Admitting: Podiatry

## 2022-10-11 ENCOUNTER — Ambulatory Visit: Payer: Medicare PPO | Attending: Cardiovascular Disease | Admitting: Cardiovascular Disease

## 2022-10-11 VITALS — BP 100/72 | HR 74 | Ht 62.0 in | Wt 151.0 lb

## 2022-10-11 DIAGNOSIS — M19072 Primary osteoarthritis, left ankle and foot: Secondary | ICD-10-CM

## 2022-10-11 DIAGNOSIS — G4733 Obstructive sleep apnea (adult) (pediatric): Secondary | ICD-10-CM | POA: Diagnosis not present

## 2022-10-11 DIAGNOSIS — M778 Other enthesopathies, not elsewhere classified: Secondary | ICD-10-CM

## 2022-10-11 DIAGNOSIS — I480 Paroxysmal atrial fibrillation: Secondary | ICD-10-CM

## 2022-10-11 DIAGNOSIS — E7800 Pure hypercholesterolemia, unspecified: Secondary | ICD-10-CM

## 2022-10-11 DIAGNOSIS — I700 Atherosclerosis of aorta: Secondary | ICD-10-CM

## 2022-10-11 DIAGNOSIS — I10 Essential (primary) hypertension: Secondary | ICD-10-CM | POA: Diagnosis not present

## 2022-10-11 MED ORDER — APIXABAN 5 MG PO TABS: 5.0000 mg | ORAL_TABLET | Freq: Two times a day (BID) | ORAL | 3 refills | Status: DC

## 2022-10-11 MED ORDER — METOPROLOL SUCCINATE ER 50 MG PO TB24: 25.0000 mg | ORAL_TABLET | Freq: Every day | ORAL | 3 refills | Status: DC

## 2022-10-11 NOTE — Patient Instructions (Signed)
Medication Instructions:  No changes  If you need a refill on your cardiac medications before your next appointment, please call your pharmacy.   Lab work: No new labs needed  Testing/Procedures: No new testing needed  Follow-Up: At [facility], you and your health needs are our priority.  As part of our continuing mission to provide you with exceptional heart care, we have created designated Provider Care Teams.  These Care Teams include your primary Cardiologist (physician) and Advanced Practice Providers (APPs -  Physician Assistants and Nurse Practitioners) who all work together to provide you with the care you need, when you need it.  You will need a follow up appointment in 12 months  Providers on your designated Care Team:   Christopher Berge, NP Ryan Dunn, PA-C Cadence Furth, PA-C  COVID-19 Vaccine Information can be found at: [email or web address] For questions related to vaccine distribution or appointments, please email [email or web address] or call [phone].   

## 2022-10-11 NOTE — Addendum Note (Signed)
Addended by: Jani Gravel on: 10/11/2022 10:44 AM   Modules accepted: Orders

## 2022-10-12 NOTE — Progress Notes (Signed)
  Subjective:  Patient ID: Kathy Long, female    DOB: Dec 04, 1950,  MRN: [id number]  Chief Complaint  Patient presents with   Toe Pain    left top of foot pain/ achiness/ on and off    72 y.o. female presents with the above complaint. History confirmed with patient.  Right foot is doing well.  She has pain and tenderness in the front of the ankle occasionally with swelling.  She is going to [place] next month for vacation  Objective:  Physical Exam: warm, good capillary refill, no trophic changes or ulcerative lesions, normal DP and PT pulses, normal sensory exam, and pain tenderness and swelling across dorsal proximal midfoot anterior to the ankle joint, no pain in the ankle joint itself.  Good range of motion of Subtalar joint    Radiographs: Multiple views x-ray of the left foot: Dorsal spurring of talonavicular joint, overall joint spaces fairly maintained Assessment:   1. Arthritis of midtarsal joint of left foot   2. Capsulitis of left foot      Plan:  Patient was evaluated and treated and all questions answered.  We discussed the presence of the arthritis of the talonavicular joint on the left foot.  We discussed treatment of this.  It is not nearly as severe as the right foot was prior to surgery I do not think she needs to consider surgical intervention at this point.  We discussed supportive shoe gear.  I recommended corticosteroid injection to alleviate pain and inflammation prior to her trip.  Following sterile prep with Betadine the left foot was injected with 2 mg of dexamethasone, 5 mg of Kenalog and 1 cc of Marcaine 0.5% plain.  She tolerated this well.  Return if symptoms worsen or fail to improve.

## 2022-10-12 NOTE — Progress Notes (Unsigned)
    Aleen Sells D.[facility] [address] [place] [address] Phone: [phone]   Assessment and Plan:     There are no diagnoses linked to this encounter.  ***   Pertinent previous records reviewed include ***   Follow Up: ***     Subjective:   I, Shaquia Berkley, am serving as a Neurosurgeon for Doctor Richardean Sale  Chief Complaint: neck pain   HPI:   10/13/2022 Patient is a 72 year old female complaining of neck pain. Patient states  Relevant Historical Information: ***  Additional pertinent review of systems negative.   Current Outpatient Medications:    ALPRAZolam (XANAX) 0.25 MG tablet, Take 1 tablet (0.25 mg total) by mouth at bedtime., Disp: 30 tablet, Rfl: 5   amLODipine (NORVASC) 2.5 MG tablet, TAKE ONE TABLET (2.5 MG) BY MOUTH EVERY DAY, Disp: 90 tablet, Rfl: 1   apixaban (ELIQUIS) 5 MG TABS tablet, Take 1 tablet (5 mg total) by mouth 2 (two) times daily., Disp: 180 tablet, Rfl: 3   benzonatate (TESSALON) 100 MG capsule, Take 2 capsules (200 mg total) by mouth 3 (three) times daily as needed for cough., Disp: 30 capsule, Rfl: 0   Cholecalciferol 125 MCG (5000 UT) TABS, Take 5,000 Units by mouth in the morning., Disp: , Rfl:    citalopram (CELEXA) 20 MG tablet, Take 1 tablet (20 mg total) by mouth daily., Disp: 90 tablet, Rfl: 1   clobetasol (TEMOVATE) 0.05 % external solution, APPLY DAILY UNTIL RASH IS CLEAR (Patient taking differently: Apply 1 Application topically daily as needed (Eczema).), Disp: 50 mL, Rfl: 0   Coenzyme Q10 100 MG capsule, Take 100 mg by mouth daily. (Patient not taking: Reported on 10/11/2022), Disp: , Rfl:    colchicine 0.6 MG tablet, Take 1 tablet (0.6 mg total) by mouth 2 (two) times daily for 5 days. (Patient not taking: Reported on 10/11/2022), Disp: 10 tablet, Rfl: 0   diphenhydrAMINE (BENADRYL) 25 mg capsule, Take 1-2 capsules (25-50 mg total) by mouth at bedtime as needed for allergies. (Patient not  taking: Reported on 10/11/2022), Disp: 20 capsule, Rfl: 0   Famotidine (ACID CONTROLLER PO), Take by mouth daily., Disp: , Rfl:    guaiFENesin (MUCINEX) 600 MG 12 hr tablet, Take 2 tablets (1,200 mg total) by mouth 2 (two) times daily. (Patient not taking: Reported on 10/11/2022), Disp: 20 tablet, Rfl: 0   metoprolol succinate (TOPROL-XL) 50 MG 24 hr tablet, Take 0.5 tablets (25 mg total) by mouth daily. Take with or immediately following a meal., Disp: 90 tablet, Rfl: 3   ondansetron (ZOFRAN) 4 MG tablet, TAKE 1 TABLET BY MOUTH EVERY 8 HOURS AS NEEDED FOR NAUSEA AND VOMITING, Disp: 20 tablet, Rfl: 0   pantoprazole (PROTONIX) 40 MG tablet, Take 1 tablet (40 mg total) by mouth daily. (Patient not taking: Reported on 10/11/2022), Disp: 45 tablet, Rfl: 0   rosuvastatin (CRESTOR) 10 MG tablet, Take 1 tablet (10 mg total) by mouth daily., Disp: 90 tablet, Rfl: 1   tirzepatide (ZEPBOUND) 10 MG/0.5ML Pen, Inject 10 mg into the skin once a week., Disp: 2 mL, Rfl: 5   Objective:     There were no vitals filed for this visit.    There is no height or weight on file to calculate BMI.    Physical Exam:    ***   Electronically signed by:  Aleen Sells [facility] 7:21 AM 10/12/22

## 2022-10-13 ENCOUNTER — Ambulatory Visit (INDEPENDENT_AMBULATORY_CARE_PROVIDER_SITE_OTHER): Payer: Medicare PPO

## 2022-10-13 ENCOUNTER — Ambulatory Visit: Payer: Medicare PPO | Admitting: Sports Medicine

## 2022-10-13 VITALS — BP 118/80 | HR 66 | Ht 62.0 in | Wt 151.0 lb

## 2022-10-13 DIAGNOSIS — M542 Cervicalgia: Secondary | ICD-10-CM

## 2022-10-13 DIAGNOSIS — M5030 Other cervical disc degeneration, unspecified cervical region: Secondary | ICD-10-CM

## 2022-10-13 NOTE — Patient Instructions (Addendum)
Good to see you Funny bunny polish  Tylenol [phone] mg 2-3 times a day for pain relief  Neck HEP  Use heating pads, and Voltaren gel over areas of pain  As needed follow up

## 2022-10-15 ENCOUNTER — Other Ambulatory Visit: Payer: Self-pay | Admitting: Internal Medicine

## 2022-10-21 ENCOUNTER — Other Ambulatory Visit (HOSPITAL_COMMUNITY): Payer: Self-pay

## 2022-10-21 ENCOUNTER — Other Ambulatory Visit: Payer: Self-pay

## 2022-10-23 ENCOUNTER — Other Ambulatory Visit (HOSPITAL_COMMUNITY): Payer: Self-pay

## 2022-10-26 ENCOUNTER — Other Ambulatory Visit: Payer: Self-pay

## 2022-10-29 DIAGNOSIS — H903 Sensorineural hearing loss, bilateral: Secondary | ICD-10-CM | POA: Diagnosis not present

## 2022-11-01 ENCOUNTER — Other Ambulatory Visit: Payer: Self-pay | Admitting: Internal Medicine

## 2022-11-01 ENCOUNTER — Other Ambulatory Visit (HOSPITAL_COMMUNITY): Payer: Self-pay

## 2022-11-01 ENCOUNTER — Encounter: Payer: Self-pay | Admitting: Internal Medicine

## 2022-11-02 MED ORDER — ONDANSETRON HCL 4 MG PO TABS: 4.0000 mg | ORAL_TABLET | Freq: Three times a day (TID) | ORAL | 0 refills | Status: DC | PRN

## 2022-11-02 NOTE — Telephone Encounter (Signed)
Patient comment: The 10 mg Zepbound causes some degree of nausea. mI am leaving for [place] this coming weekend and want to have some at the ready in case of nausea

## 2022-11-03 MED ORDER — ZEPBOUND 10 MG/0.5ML ~~LOC~~ SOAJ: 10.0000 mg | SUBCUTANEOUS | 5 refills | Status: DC

## 2022-11-15 ENCOUNTER — Encounter: Payer: Self-pay | Admitting: Internal Medicine

## 2022-11-16 NOTE — Telephone Encounter (Signed)
LMTCB need to further triage pt.

## 2022-11-17 ENCOUNTER — Other Ambulatory Visit: Payer: Self-pay | Admitting: Internal Medicine

## 2022-11-18 ENCOUNTER — Encounter: Payer: Self-pay | Admitting: Internal Medicine

## 2022-11-18 NOTE — Telephone Encounter (Signed)
noted 

## 2022-11-22 NOTE — Telephone Encounter (Signed)
LMTCB

## 2022-11-24 ENCOUNTER — Ambulatory Visit: Payer: Medicare PPO | Admitting: Cardiology

## 2022-11-26 ENCOUNTER — Other Ambulatory Visit: Payer: Self-pay | Admitting: Internal Medicine

## 2022-11-30 NOTE — Progress Notes (Unsigned)
  Electrophysiology Office Follow up Visit Note:    Date:  12/01/2022   ID:  Kathy Long, DOB [birth year], MRN [id number]  PCP:  Sherlene Shams, MD  [facility] Cardiologist:  None  [facility] Electrophysiologist:  Lanier Prude, MD    Interval History:    Kathy Long is a 72 y.o. female who presents for a follow up visit.   The patient had an A-fib ablation on September 09, 2022.  During the ablation, the pulmonary veins were isolated.  She saw [name] in the A-fib clinic September 21, 2022.  At that appointment there were no interval episodes of atrial fibrillation. She has been doing well since the procedure.  No recurrence of atrial fibrillation.  Continues to take Eliquis.      Past medical, surgical, social and family history were reviewed.  ROS:   Please see the history of present illness.    All other systems reviewed and are negative.  EKGs/Labs/Other Studies Reviewed:    The following studies were reviewed today:    EKG Interpretation Date/Time:  Wednesday December 01 2022 10:54:33 EDT Ventricular Rate:  69 PR Interval:  144 QRS Duration:  66 QT Interval:  430 QTC Calculation: 460 R Axis:   13  Text Interpretation: Normal sinus rhythm Confirmed by Steffanie Dunn [phone]) on 12/01/2022 10:55:58 AM    Physical Exam:    VS:  BP 128/88   Ht 5\' 2"  (1.575 m)   Wt 147 lb (66.7 kg)   BMI 26.89 kg/m     Wt Readings from Last 3 Encounters:  12/01/22 147 lb (66.7 kg)  10/13/22 151 lb (68.5 kg)  10/11/22 151 lb (68.5 kg)     GEN:  Well nourished, well developed in no acute distress CARDIAC: RRR, no murmurs, rubs, gallops RESPIRATORY:  Clear to auscultation without rales, wheezing or rhonchi       ASSESSMENT:    1. Paroxysmal atrial fibrillation (HCC)   2. Primary hypertension    PLAN:    In order of problems listed above:  #Paroxysmal atrial fibrillation Continue Eliquis for stroke prophylaxis. Doing well after catheter ablation without  recurrence. Continue metoprolol  We discussed the data supporting stopping Eliquis after a seemingly successful A-fib ablation.  I would like to keep her on the Eliquis for now given her elevated CHA2DS2-VASc (gender, age, hypertension).  We will revisit this conversation at the 62-month mark.  If she were to have no episodes of atrial fibrillation for 1 total year after ablation, could consider stopping the blood thinner as long as she understands there is still a slight increased risk of stroke compared to a population who has never had atrial fibrillation.  #Hypertension At goal today.  Recommend checking blood pressures 1-2 times per week at home and recording the values.  Recommend bringing these recordings to the primary care physician. Continue amlodipine   Follow up 9 months with EP APP.   Signed, Steffanie Dunn, MD, [facility], [facility] 12/01/2022 11:09 AM    Electrophysiology [facility]

## 2022-12-01 ENCOUNTER — Ambulatory Visit: Payer: Medicare PPO | Attending: Cardiology | Admitting: Cardiology

## 2022-12-01 ENCOUNTER — Encounter: Payer: Self-pay | Admitting: Cardiology

## 2022-12-01 VITALS — BP 128/88 | Ht 62.0 in | Wt 147.0 lb

## 2022-12-01 DIAGNOSIS — L448 Other specified papulosquamous disorders: Secondary | ICD-10-CM | POA: Diagnosis not present

## 2022-12-01 DIAGNOSIS — L298 Other pruritus: Secondary | ICD-10-CM | POA: Diagnosis not present

## 2022-12-01 DIAGNOSIS — I480 Paroxysmal atrial fibrillation: Secondary | ICD-10-CM

## 2022-12-01 DIAGNOSIS — I10 Essential (primary) hypertension: Secondary | ICD-10-CM | POA: Diagnosis not present

## 2022-12-01 NOTE — Patient Instructions (Addendum)
Medication Instructions:  Your physician recommends that you continue on your current medications as directed. Please refer to the Current Medication list given to you today.  *If you need a refill on your cardiac medications before your next appointment, please call your pharmacy*  Follow-Up: At [facility], you and your health needs are our priority.  As part of our continuing mission to provide you with exceptional heart care, we have created designated Provider Care Teams.  These Care Teams include your primary Cardiologist (physician) and Advanced Practice Providers (APPs -  Physician Assistants and Nurse Practitioners) who all work together to provide you with the care you need, when you need it.  Your next appointment:   9 months  Provider:   Suzann Riddle, NP   

## 2022-12-16 ENCOUNTER — Encounter: Payer: Self-pay | Admitting: Internal Medicine

## 2022-12-17 NOTE — Telephone Encounter (Signed)
Is it okay to send in the 7.5 mg dose?

## 2022-12-18 MED ORDER — ZEPBOUND 7.5 MG/0.5ML ~~LOC~~ SOAJ: 7.5000 mg | SUBCUTANEOUS | 2 refills | Status: DC

## 2022-12-18 NOTE — Telephone Encounter (Signed)
The 7.5 mg zepbound has been sent to [facility] mail order

## 2022-12-27 ENCOUNTER — Other Ambulatory Visit: Payer: Self-pay | Admitting: Internal Medicine

## 2022-12-29 ENCOUNTER — Encounter: Payer: Self-pay | Admitting: Cardiovascular Disease

## 2022-12-29 DIAGNOSIS — H903 Sensorineural hearing loss, bilateral: Secondary | ICD-10-CM | POA: Diagnosis not present

## 2023-01-13 ENCOUNTER — Encounter (INDEPENDENT_AMBULATORY_CARE_PROVIDER_SITE_OTHER): Payer: Self-pay

## 2023-01-14 ENCOUNTER — Telehealth: Payer: Self-pay | Admitting: Cardiovascular Disease

## 2023-01-14 MED ORDER — METOPROLOL SUCCINATE ER 50 MG PO TB24: 25.0000 mg | ORAL_TABLET | Freq: Every day | ORAL | 3 refills | Status: DC

## 2023-01-14 NOTE — Telephone Encounter (Signed)
*  STAT* If patient is at the pharmacy, call can be transferred to refill team.   1. Which medications need to be refilled? (please list name of each medication and dose if known)   metoprolol succinate (TOPROL-XL) 50 MG 24 hr tablet    2. Which pharmacy/location (including street and city if local pharmacy) is medication to be sent to? [facility] - [place], [place] - [address]    3. Do they need a 30 day or 90 day supply? 90 day

## 2023-01-14 NOTE — Telephone Encounter (Signed)
Requested Prescriptions   Signed Prescriptions Disp Refills   metoprolol succinate (TOPROL-XL) 50 MG 24 hr tablet 45 tablet 3    Sig: Take 0.5 tablets (25 mg total) by mouth daily. Take with or immediately following a meal.    Authorizing Provider: Antonieta Iba    Ordering User: Kendrick Fries

## 2023-01-21 ENCOUNTER — Telehealth: Payer: Self-pay | Admitting: Cardiology

## 2023-01-21 ENCOUNTER — Telehealth: Payer: Self-pay | Admitting: Cardiovascular Disease

## 2023-01-21 MED ORDER — METOPROLOL SUCCINATE ER 25 MG PO TB24: 25.0000 mg | ORAL_TABLET | Freq: Every day | ORAL | 3 refills | Status: DC

## 2023-01-21 NOTE — Telephone Encounter (Signed)
Pt c/o medication issue:  1. Name of Medication:   metoprolol succinate (TOPROL-XL) 50 MG 24 hr tablet    2. How are you currently taking this medication (dosage and times per day)? Take 0.5 tablets (25 mg total) by mouth daily. Take with or immediately following a meal.   3. Are you having a reaction (difficulty breathing--STAT)? No  4. What is your medication issue? Pt would like to know if script can be for 25 mg tablet instead of 50 mg and cut in half. Please advise

## 2023-01-21 NOTE — Telephone Encounter (Signed)
Patient has been made aware that Succinate 25 mg once daily has been sent in for her so she does not have to cut the 50 mg tablet in half.

## 2023-01-23 ENCOUNTER — Other Ambulatory Visit: Payer: Self-pay | Admitting: Internal Medicine

## 2023-01-24 ENCOUNTER — Telehealth: Payer: Self-pay | Admitting: Internal Medicine

## 2023-01-24 MED ORDER — ONDANSETRON HCL 4 MG PO TABS: 4.0000 mg | ORAL_TABLET | Freq: Three times a day (TID) | ORAL | 0 refills | Status: DC | PRN

## 2023-01-24 NOTE — Telephone Encounter (Signed)
Pt husband came into the office to drop off a handicap form. Placed in provider folder Pt husband #[phone]

## 2023-01-24 NOTE — Telephone Encounter (Signed)
Placed in quick sign folder for signature.  

## 2023-01-28 ENCOUNTER — Telehealth: Payer: Self-pay | Admitting: Internal Medicine

## 2023-01-28 NOTE — Telephone Encounter (Signed)
FYI, per patient via mychart:  "I have received notifications about scheduling an appt. I don't think I need an appt. at this time. I am being treated with a night mouth guard and it's working well for my sleep apnea. Dr.Fuller is following my progress and I am scheduled to have a sleep study soon to assess my treatment's effectiveness"

## 2023-01-28 NOTE — Telephone Encounter (Signed)
Pt's husband is aware that form is ready for pick up. Placed up front in accordion folder.

## 2023-01-28 NOTE — Telephone Encounter (Signed)
Noted  

## 2023-02-03 ENCOUNTER — Ambulatory Visit: Payer: Medicare PPO | Admitting: Physician Assistant

## 2023-02-03 DIAGNOSIS — H90A22 Sensorineural hearing loss, unilateral, left ear, with restricted hearing on the contralateral side: Secondary | ICD-10-CM | POA: Diagnosis not present

## 2023-02-09 DIAGNOSIS — H903 Sensorineural hearing loss, bilateral: Secondary | ICD-10-CM | POA: Diagnosis not present

## 2023-02-14 ENCOUNTER — Encounter: Payer: Self-pay | Admitting: Internal Medicine

## 2023-02-14 ENCOUNTER — Ambulatory Visit: Payer: Medicare PPO | Admitting: Internal Medicine

## 2023-02-14 VITALS — BP 128/76 | HR 67 | Temp 97.7°F | Ht 62.0 in | Wt 143.0 lb

## 2023-02-14 DIAGNOSIS — I10 Essential (primary) hypertension: Secondary | ICD-10-CM

## 2023-02-14 DIAGNOSIS — E663 Overweight: Secondary | ICD-10-CM | POA: Diagnosis not present

## 2023-02-14 DIAGNOSIS — D333 Benign neoplasm of cranial nerves: Secondary | ICD-10-CM

## 2023-02-14 DIAGNOSIS — D6869 Other thrombophilia: Secondary | ICD-10-CM

## 2023-02-14 DIAGNOSIS — E785 Hyperlipidemia, unspecified: Secondary | ICD-10-CM | POA: Diagnosis not present

## 2023-02-14 DIAGNOSIS — I480 Paroxysmal atrial fibrillation: Secondary | ICD-10-CM

## 2023-02-14 DIAGNOSIS — N1831 Chronic kidney disease, stage 3a: Secondary | ICD-10-CM

## 2023-02-14 DIAGNOSIS — F330 Major depressive disorder, recurrent, mild: Secondary | ICD-10-CM

## 2023-02-14 DIAGNOSIS — G4733 Obstructive sleep apnea (adult) (pediatric): Secondary | ICD-10-CM

## 2023-02-14 NOTE — Progress Notes (Unsigned)
Subjective:  Patient ID: Kathy Long, female    DOB: 04/30/1951  Age: 72 y.o. MRN: [id number]  CC: The primary encounter diagnosis was Essential hypertension. Diagnoses of Hyperlipidemia LDL goal <100, Overweight (BMI 25.0-29.9), Mild episode of recurrent major depressive disorder (HCC), Acoustic neuroma (HCC), Stage 3a chronic kidney disease (HCC), Hypercoagulable state due to paroxysmal atrial fibrillation (HCC), and OSA (obstructive sleep apnea) were also pertinent to this visit.   HPI Kathy Long presents for  Chief Complaint  Patient presents with   Medical Management of Chronic Issues    6 month follow up    1) obesity L  she has lost 42 lbs since starting pharmacotherapy with  GLP 1 agonist obtained through [facility] sky MD.    Currently on 7.5 mg  dose of generic Mounjaro.  Has been inconsistent with use  due to lack of supply,  and , she forgets to take dose and has malaise for 1-2 days after the dose with  mild nausea and headache for 1 day   No vomiting,   2) Hypertension: patient checks blood pressure twice weekly at home.  Readings have been for the most part <130/80 at rest . Patient is following a reduced salt diet most days and is taking medications as prescribed    Outpatient Medications Prior to Visit  Medication Sig Dispense Refill   ALPRAZolam (XANAX) 0.25 MG tablet Take 1 tablet (0.25 mg total) by mouth at bedtime. 30 tablet 5   apixaban (ELIQUIS) 5 MG TABS tablet Take 1 tablet (5 mg total) by mouth 2 (two) times daily. 180 tablet 3   Cholecalciferol 125 MCG (5000 UT) TABS Take 5,000 Units by mouth in the morning.     clobetasol (TEMOVATE) 0.05 % external solution APPLY DAILY UNTIL RASH IS CLEAR (Patient taking differently: Apply 1 Application topically daily as needed (Eczema).) 50 mL 0   Coenzyme Q10 100 MG capsule Take 100 mg by mouth daily.     Famotidine (ACID CONTROLLER PO) Take by mouth daily.     metoprolol succinate (TOPROL-XL) 25 MG 24 hr tablet Take 1  tablet (25 mg total) by mouth daily. Take with or immediately following a meal. 90 tablet 3   ondansetron (ZOFRAN) 4 MG tablet Take 1 tablet (4 mg total) by mouth every 8 (eight) hours as needed for nausea or vomiting. 20 tablet 0   tirzepatide (ZEPBOUND) 7.5 MG/0.5ML Pen Inject 7.5 mg into the skin once a week. 6 mL 2   amLODipine (NORVASC) 2.5 MG tablet TAKE ONE TABLET (2.5 MG) BY MOUTH EVERY DAY 90 tablet 1   benzonatate (TESSALON) 100 MG capsule Take 2 capsules (200 mg total) by mouth 3 (three) times daily as needed for cough. 30 capsule 0   citalopram (CELEXA) 20 MG tablet TAKE 1 TABLET BY MOUTH DAILY 90 tablet 1   rosuvastatin (CRESTOR) 10 MG tablet Take 1 tablet (10 mg total) by mouth daily. 90 tablet 1   No facility-administered medications prior to visit.    Review of Systems;  Patient denies headache, fevers, malaise, unintentional weight loss, skin rash, eye pain, sinus congestion and sinus pain, sore throat, dysphagia,  hemoptysis , cough, dyspnea, wheezing, chest pain, palpitations, orthopnea, edema, abdominal pain, nausea, melena, diarrhea, constipation, flank pain, dysuria, hematuria, urinary  Frequency, nocturia, numbness, tingling, seizures,  Focal weakness, Loss of consciousness,  Tremor, insomnia, depression, anxiety, and suicidal ideation.      Objective:  BP 128/76   Pulse 67   Temp  97.7 F (36.5 C) (Oral)   Ht 5\' 2"  (1.575 m)   Wt 143 lb (64.9 kg)   SpO2 97%   BMI 26.16 kg/m   BP Readings from Last 3 Encounters:  02/14/23 128/76  12/01/22 128/88  10/13/22 118/80    Wt Readings from Last 3 Encounters:  02/14/23 143 lb (64.9 kg)  12/01/22 147 lb (66.7 kg)  10/13/22 151 lb (68.5 kg)    Physical Exam Vitals reviewed.  Constitutional:      General: She is not in acute distress.    Appearance: Normal appearance. She is normal weight. She is not ill-appearing, toxic-appearing or diaphoretic.  HENT:     Head: Normocephalic.  Eyes:     General: No scleral  icterus.       Right eye: No discharge.        Left eye: No discharge.     Conjunctiva/sclera: Conjunctivae normal.  Cardiovascular:     Rate and Rhythm: Normal rate and regular rhythm.     Heart sounds: Normal heart sounds.  Pulmonary:     Effort: Pulmonary effort is normal. No respiratory distress.     Breath sounds: Normal breath sounds.  Musculoskeletal:        General: Normal range of motion.  Skin:    General: Skin is warm and dry.  Neurological:     General: No focal deficit present.     Mental Status: She is alert and oriented to person, place, and time. Mental status is at baseline.  Psychiatric:        Mood and Affect: Mood normal.        Behavior: Behavior normal.        Thought Content: Thought content normal.        Judgment: Judgment normal.   Lab Results  Component Value Date   HGBA1C 5.4 10/09/2021   HGBA1C 5.0 03/10/2021   HGBA1C 5.8 02/28/2019    Lab Results  Component Value Date   CREATININE 1.19 (H) 09/09/2022   CREATININE 1.11 (H) 08/19/2022   CREATININE 1.11 08/13/2022    Lab Results  Component Value Date   WBC 5.5 08/19/2022   HGB 13.4 08/19/2022   HCT 40.7 08/19/2022   PLT 198 08/19/2022   GLUCOSE 80 09/09/2022   CHOL 178 08/13/2022   TRIG 130.0 08/13/2022   HDL 47.00 08/13/2022   LDLDIRECT 100.0 08/13/2022   LDLCALC 105 (H) 08/13/2022   ALT 26 08/13/2022   AST 19 08/13/2022   NA 142 09/09/2022   K 3.6 09/09/2022   CL 109 09/09/2022   CREATININE 1.19 (H) 09/09/2022   BUN 18 09/09/2022   CO2 25 09/09/2022   TSH 1.25 05/26/2022   INR 0.91 10/23/2009   HGBA1C 5.4 10/09/2021   MICROALBUR 1.3 10/09/2021    No results found.  Assessment & Plan:  .Essential hypertension -     Comprehensive metabolic panel; Future -     Microalbumin / creatinine urine ratio; Future  Hyperlipidemia LDL goal <100 -     Lipid panel; Future -     LDL cholesterol, direct; Future  Overweight (BMI 25.0-29.9) -     CBC with Differential/Platelet;  Future -     TSH; Future  Mild episode of recurrent major depressive disorder (HCC) Assessment & Plan: No longer using wellbutrin   Symptoms controlled with citalopram and have improved with improved physical condition    Acoustic neuroma [facility]) Assessment & Plan: 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   Stable by last MRI in Feb 2024    Stage 3a chronic kidney disease (HCC) Assessment & Plan: GFR is stable,;  she has sotpped seeing nephrology by choice to save $40 opay.  Renal function panel ordered.  Lab Results  Component Value Date   CREATININE 1.19 (H) 09/09/2022   Lab Results  Component Value Date   NA 142 09/09/2022   K 3.6 09/09/2022   CL 109 09/09/2022   CO2 25 09/09/2022      Hypercoagulable state due to paroxysmal atrial fibrillation (HCC) Assessment & Plan: She is tolerating use of Eliquis for embolic stroke risk mitigation due to  atrial fibrillation. Patient has no signs of bleeding and is advised to notify her specialists prior to any procedure that may required suspension of Eliquis     OSA (obstructive sleep apnea) Assessment & Plan: Moderate to severe sleep apnea history.  Patient has deferred treatment ; since she has lost around 40 pounds.  Symptom burden has decreased.  She has tried CPAP multiple times and has been unsuccessful.  Patient has been evaluated for the inspire device and has been considered a successful candidate. However I have discouraged her from pursuing the procedure. Due to reports of complications from other patients. .  For now she does not appear to have signs or symptoms of OSA since he has lost weight.    Other orders -     amLODIPine Besylate; TAKE ONE TABLET (2.5 MG) BY MOUTH EVERY DAY  Dispense: 90 tablet; Refill: 1 -     Citalopram Hydrobromide; TAKE 1 TABLET BY MOUTH DAILY  Dispense: 90 tablet; Refill: 1 -      Rosuvastatin Calcium; Take 1 tablet (10 mg total) by mouth daily.  Dispense: 90 tablet; Refill: 1 -     Benzonatate; Take 2 capsules (200 mg total) by mouth 3 (three) times daily as needed for cough.  Dispense: 30 capsule; Refill: 0    Follow-up: No follow-ups on file.   Sherlene Shams, MD

## 2023-02-14 NOTE — Assessment & Plan Note (Addendum)
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   Stable by last MRI in Feb 2024

## 2023-02-14 NOTE — Assessment & Plan Note (Signed)
GFR is stable,;  she has sotpped seeing nephrology by choice to save $40 opay.  Renal function panel ordered.  Lab Results  Component Value Date   CREATININE 1.19 (H) 09/09/2022   Lab Results  Component Value Date   NA 142 09/09/2022   K 3.6 09/09/2022   CL 109 09/09/2022   CO2 25 09/09/2022

## 2023-02-15 MED ORDER — BENZONATATE 100 MG PO CAPS: 200.0000 mg | ORAL_CAPSULE | Freq: Three times a day (TID) | ORAL | 0 refills | Status: DC | PRN

## 2023-02-15 MED ORDER — ROSUVASTATIN CALCIUM 10 MG PO TABS: 10.0000 mg | ORAL_TABLET | Freq: Every day | ORAL | 1 refills | Status: DC

## 2023-02-15 MED ORDER — CITALOPRAM HYDROBROMIDE 20 MG PO TABS: ORAL_TABLET | ORAL | 1 refills | Status: DC

## 2023-02-15 MED ORDER — AMLODIPINE BESYLATE 2.5 MG PO TABS: ORAL_TABLET | ORAL | 1 refills | Status: DC

## 2023-02-15 NOTE — Assessment & Plan Note (Signed)
She is tolerating use of Eliquis for embolic stroke risk mitigation due to  atrial fibrillation. Patient has no signs of bleeding and is advised to notify her specialists prior to any procedure that may required suspension of Eliquis

## 2023-02-15 NOTE — Assessment & Plan Note (Signed)
No longer using wellbutrin   Symptoms controlled with citalopram and have improved with improved physical condition

## 2023-02-15 NOTE — Assessment & Plan Note (Signed)
Moderate to severe sleep apnea history.  Patient has deferred treatment ; since she has lost around 40 pounds.  Symptom burden has decreased.  She has tried CPAP multiple times and has been unsuccessful.  Patient has been evaluated for the inspire device and has been considered a successful candidate. However I have discouraged her from pursuing the procedure. Due to reports of complications from other patients. .  For now she does not appear to have signs or symptoms of OSA since he has lost weight.

## 2023-02-23 DIAGNOSIS — H903 Sensorineural hearing loss, bilateral: Secondary | ICD-10-CM | POA: Diagnosis not present

## 2023-03-10 ENCOUNTER — Other Ambulatory Visit: Payer: Self-pay | Admitting: Internal Medicine

## 2023-03-10 NOTE — Telephone Encounter (Signed)
LOV: 02/14/2023

## 2023-03-25 DIAGNOSIS — H3552 Pigmentary retinal dystrophy: Secondary | ICD-10-CM | POA: Diagnosis not present

## 2023-03-25 DIAGNOSIS — H26491 Other secondary cataract, right eye: Secondary | ICD-10-CM | POA: Diagnosis not present

## 2023-03-25 DIAGNOSIS — Z961 Presence of intraocular lens: Secondary | ICD-10-CM | POA: Diagnosis not present

## 2023-03-30 DIAGNOSIS — H903 Sensorineural hearing loss, bilateral: Secondary | ICD-10-CM | POA: Diagnosis not present

## 2023-04-06 ENCOUNTER — Encounter: Payer: Self-pay | Admitting: Psychology

## 2023-04-07 ENCOUNTER — Ambulatory Visit: Payer: Medicare PPO | Admitting: Psychology

## 2023-04-07 ENCOUNTER — Encounter: Payer: Self-pay | Admitting: Psychology

## 2023-04-07 DIAGNOSIS — R4189 Other symptoms and signs involving cognitive functions and awareness: Secondary | ICD-10-CM

## 2023-04-07 DIAGNOSIS — I679 Cerebrovascular disease, unspecified: Secondary | ICD-10-CM

## 2023-04-07 DIAGNOSIS — F09 Unspecified mental disorder due to known physiological condition: Secondary | ICD-10-CM

## 2023-04-07 NOTE — Progress Notes (Signed)
NEUROPSYCHOLOGICAL EVALUATION . [facility] Department of Neurology  Date of Evaluation: April 07, 2023  Reason for Referral:   Kathy Long is a 72 y.o. left-handed Caucasian female referred by Kathy Kays, PA-C, to characterize her current cognitive functioning and assist with diagnostic clarity and treatment planning in Kathy context of subjective cognitive decline.   Assessment and Plan:   Clinical Impression(s): Dr. Adaline Long pattern of performance is suggestive of isolated impairments surrounding complex attention and delayed retrieval aspects of visual memory. Performances were appropriate relative to age-matched peers across all other assessed domains. This includes processing speed, basic attention, executive functioning, safety/judgment, receptive and expressive language, visuospatial abilities, and verbal learning and memory. There remains Kathy potential that some below average performances represent cognitive decline given her educational history. However, there is no testing available for comparison purposes and these may also represent normal intraindividual variability or longstanding patterns of strength and weakness. Functionally, Kathy Long denied difficulties completing instrumental activities of daily living (ADLs) independently. While she may be on Kathy border between normal functioning and mild neurocognitive disorder diagnostic classifications, I do not feel that she quite warrants a formal neurocognitive disorder diagnosis at Kathy present time.  Isolated deficits across testing are nonspecific in nature. As such, Kathy underlying etiology for said deficits remains unclear. She reported cataracts and ongoing visual acuity concerns which could at least partially explain deficits across a visual memory task as these shapes are fairly abstract and detailed. Recent neuroimaging has suggested moderate microvascular ischemic disease which has progressed since  early 2023. There remains Kathy potential that this, when combined with mild psychiatric distress and other biopsychosocial stressors, medical comorbidities, and medication side effects (i.e., Xanax), could explain some ongoing dysfunction.   Neurologically speaking, her pattern of testing, when combined with a history of fully-formed visual hallucinations, could raise some concern for underlying Lewy body disease. However, current testing patterns do not align in a strongly compelling fashion, visual acuity concerns may have impacted visual performances, hallucinations may simply be related to medication side effects, and she does not display other parkinsonian symptoms which would elevate concerns. This seems unlikely based upon currently available information and symptom reporting. Outside of her isolated weakness across a shape learning task, retention rates across three verbal tasks ranged from 89% to 100%. Additionally, performances across all recognition/consolidation tasks (including Kathy shape learning task) were consistently appropriate. This does not suggest rapid forgetting or an evolving storage impairment and current testing performances are not consistent with symptomatic Alzheimer's disease at Kathy present time. She does not display behavioral characteristics worrisome for frontotemporal lobar degeneration. Continued medical monitoring will be important moving forward.   Recommendations: A repeat neuropsychological evaluation in 18-24 months (or sooner if functional decline is noted) is recommended to assess Kathy trajectory of future cognitive decline should it occur. This will also aid in future efforts towards improved diagnostic clarity.  A combination of medication and psychotherapy has been shown to be most effective at treating symptoms of anxiety and depression. As such, Kathy Long is encouraged to speak with her prescribing physician regarding medication adjustments to optimally manage these  symptoms. Likewise, Kathy Long could consider engaging in short-term psychotherapy to address symptoms of psychiatric distress. She would benefit from an active and collaborative therapeutic environment, rather than one purely supportive in nature. Recommended treatment modalities include Cognitive Behavioral Therapy (CBT) or Acceptance and Commitment Therapy (ACT).  Performance across neurocognitive testing is not a strong predictor of an individual's safety  operating a motor vehicle. Should her family wish to pursue a formalized driving evaluation, they could reach out to Kathy following agencies: Kathy Kathy Long in [place]: [phone] Kathy Long: [phone] Kathy Long: [phone] Kathy Long: [phone] or [phone]  Should there be progression of current deficits over time, Kathy Long is unlikely to regain any independent living skills lost. Therefore, it is recommended that she remain as involved as possible in all aspects of household chores, finances, and medication management, with supervision to ensure adequate performance. she will likely benefit from Kathy establishment and maintenance of a routine in order to maximize her functional abilities over time.  If not already done, Kathy Long and her family may want to discuss her wishes regarding durable power of attorney and medical decision making, so that she can have input into these choices. If they require legal assistance with this, long-term care resource access, or other aspects of estate planning, they could reach out to Kathy Long at [phone] for a free consultation.  Kathy Long is encouraged to attend to lifestyle factors for brain health (e.g., regular physical exercise, good nutrition habits and consideration of Kathy MIND-DASH diet, regular participation in cognitively-stimulating activities, and general stress management techniques), which are likely to have benefits for both  emotional adjustment and cognition. In fact, in addition to promoting good general health, regular exercise incorporating aerobic activities (e.g., brisk walking, jogging, cycling, etc.) has been demonstrated to be a very effective treatment for depression and stress, with similar efficacy rates to both antidepressant medication and psychotherapy. Optimal control of vascular risk factors (including safe cardiovascular exercise and adherence to dietary recommendations) is encouraged. Likewise, continued compliance with sleep apnea treatment will also be important. Continued participation in activities which provide mental stimulation and social interaction is also recommended.   Memory can be improved using internal strategies such as rehearsal, repetition, chunking, mnemonics, association, and imagery. External strategies such as written notes in a consistently used memory journal, visual and nonverbal auditory cues such as a calendar on Kathy refrigerator or appointments with alarm, such as on a cell phone, can also help maximize recall.    When learning new information, she would benefit from information being broken up into small, manageable pieces. she may also find it helpful to articulate Kathy material in her own words and in a context to promote encoding at Kathy onset of a new task. This material may need to be repeated multiple times to promote encoding. She will likely understand and retain new information better if it is presented to her in a meaningful or well-organized manner at Kathy outset, such as grouping items into meaningful categories or presenting information in an outlined, bulleted, or story format.  To address problems with fluctuating attention and/or executive dysfunction, she may wish to consider:   -Avoiding external distractions when needing to concentrate   -Limiting exposure to fast paced environments with multiple sensory demands   -Writing down complicated information and using  checklists   -Attempting and completing one task at a time (i.e., no multi-tasking)   -Verbalizing aloud each step of a task to maintain focus   -Taking frequent breaks during Kathy completion of steps/tasks to avoid fatigue   -Reducing Kathy amount of information considered at one time   -Scheduling more difficult activities for a time of day where she is usually most alert  Review of Records:   Kathy Long was seen by [facility] Kathy Kays, PA-C) on 05/26/2022 for an evaluation  of memory loss. At that time, primary memory concerns surrounded word finding and trouble coming up with information quickly while conversing with others. She also noted that it has seemed harder to remember Kathy names of individuals who she may meet for Kathy first time. She also described concern surrounding ongoing visual hallucinations. Symptoms seemed to appear when taking various medications (e.g., oxycodone) and following prior surgical procedures. Hallucinations have been fully formed in nature (e.g., seeing her daughter) and sometimes distressing (e.g., seeing a zombie at Kathy side of Kathy bed). Hallucinations did seem to be becoming less frequent at that time. ADL dysfunction was denied. Performance on a brief cognitive screening instrument (MOCA) was 24/30. Ultimately, Ms. Pelaez was referred for a comprehensive neuropsychological evaluation to characterize her cognitive abilities and to assist with diagnostic clarity and treatment planning.   Kathy Long most recently met with Ms. Wertman on 08/02/2022 for follow-up. Visual hallucinations were said to have fully ceased after stopping melatonin supplementation. Cognition was said to be stable.   Neuroimaging Brain MRI on 08/24/2021 suggested mild microvascular ischemic disease and a small enhancing mass in Kathy left left cerebellopontine angle and internal auditory canal, which was favored to represent a schwannoma. 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 Brain MRI on 01/10/2020 was stable. Brain MRI on 11/27/2021 was stable. Brain MRI on 07/08/2022 was stable with regard to previous findings. It also revealed mild generalized volume loss.   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 There is tumor extending from  Kathy cerebellar pontine angle cistern to Kathy fundus of Kathy internal  auditory canal. No mass-effect on Kathy brainstem.     Adiposity 09/28/2014   Body mass index is 31.45 kg/(m^2).     Last Assessment & Plan:   I have addressed  BMI and recommended wt loss of 10% of body weight over Kathy next 6 months using a low glycemic index diet and regular exercise a minimum of 5 days per week.     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 Has been having foot pain since May 2022.     CKD (chronic kidney disease) stage 3, GFR 30-59 ml/min 04/13/2018   Nephrology referral recommended.      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   Postoperative bile leak    Right shoulder pain 10/17/2019   S/P laparoscopic cholecystectomy 07/19/2017   Squamous cell carcinoma of skin of face 10/21/2011   Overview:   SCC of Kathy right malar cheek treated with Mohs surgery 10/21/11     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     Past Surgical History:  Procedure Laterality Date   ANKLE FRACTURE SURGERY Left    APPENDECTOMY  2010   ATRIAL FIBRILLATION ABLATION N/A 09/09/2022   Procedure: ATRIAL FIBRILLATION ABLATION;  Surgeon: Lanier Prude, MD;  Location: [facility];  Service: Cardiovascular;  Laterality: N/A;   BACK SURGERY     disectomy lumbar, scar tissue   BREAST CYST EXCISION Right 1970   axilla   buninectomy Bilateral    CATARACT EXTRACTION W/PHACO Left 04/12/2018   Procedure: CATARACT EXTRACTION PHACO AND INTRAOCULAR LENS PLACEMENT (IOC)  LEFT TORIC LENS;  Surgeon: Lockie Mola, MD;  Location: [facility];  Service: Ophthalmology;  Laterality: Left;   CATARACT EXTRACTION W/PHACO Right 05/10/2018   Procedure: CATARACT EXTRACTION PHACO AND INTRAOCULAR LENS PLACEMENT (IOC)  RIGHT TORIC LENS;  Surgeon: Lockie Mola, MD;  Location: [facility];  Service: Ophthalmology;  Laterality: Right;  DIABETIC   CHOLECYSTECTOMY N/A 07/05/2017   Procedure: LAPAROSCOPIC CHOLECYSTECTOMY WITH INTRAOPERATIVE CHOLANGIOGRAM;  Surgeon: Earline Mayotte, MD;  Location: [facility];  Service: General;  Laterality: N/A;    COLONOSCOPY     2009 and color gaurd in 2018   COLONOSCOPY N/A 03/25/2020   Procedure: COLONOSCOPY;  Surgeon: Regis Bill, MD;  Location: [facility];  Service: Endoscopy;  Laterality: N/A;   DRUG INDUCED ENDOSCOPY N/A 05/06/2021   Procedure: DRUG INDUCED SLEEP ENDOSCOPY;  Surgeon: Christia Reading, MD;  Location: [facility];  Service: ENT;  Laterality: N/A;   ERCP N/A 07/12/2017   Procedure: ENDOSCOPIC RETROGRADE CHOLANGIOPANCREATOGRAPHY (ERCP);  Surgeon: Midge Minium, MD;  Location: [facility];  Service: Endoscopy;  Laterality: N/A;   ERCP N/A 10/04/2017   Procedure: ENDOSCOPIC RETROGRADE CHOLANGIOPANCREATOGRAPHY (ERCP);  Surgeon: Midge Minium, MD;  Location: [facility];  Service: Endoscopy;  Laterality: N/A;   FOOT SURGERY     x 2   INCISIONAL HERNIA REPAIR N/A 12/04/2020   Procedure: VENTRAL INCISIONAL HERNIA REPAIR WITH MESH;  Surgeon: Darnell Level, MD;  Location: [facility];  Service: General;  Laterality: N/A;   MELANOMA EXCISION Left 2000   PALATOPLASTY N/A 04/08/2015   Procedure: PALATOPLASTY;  Surgeon: Linus Salmons, MD;  Location: [facility];  Service: ENT;  Laterality: N/A;   TONSILLECTOMY N/A 04/08/2015   Procedure: TONSILLECTOMY;  Surgeon: Linus Salmons, MD;  Location: [facility];  Service: ENT;  Laterality: N/A;   TUBAL LIGATION      Current Outpatient Medications:    ALPRAZolam (XANAX) 0.25 MG tablet, TAKE ONE TABLET BY MOUTH AT BEDTIME, Disp: 30 tablet, Rfl: 5   amLODipine (NORVASC) 2.5 MG tablet, TAKE ONE TABLET (2.5 MG) BY MOUTH EVERY DAY, Disp: 90 tablet, Rfl: 1   apixaban (ELIQUIS) 5 MG TABS tablet, Take 1 tablet (5 mg total) by mouth 2 (two) times daily., Disp: 180 tablet, Rfl: 3   benzonatate (TESSALON) 100 MG capsule, Take 2 capsules (200 mg total) by mouth 3 (three)  times daily as needed for cough., Disp: 30 capsule, Rfl: 0   Cholecalciferol 125 MCG (5000 UT) TABS, Take 5,000 Units by mouth in Kathy morning., Disp: , Rfl:    citalopram  (CELEXA) 20 MG tablet, TAKE 1 TABLET BY MOUTH DAILY, Disp: 90 tablet, Rfl: 1   clobetasol (TEMOVATE) 0.05 % external solution, APPLY DAILY UNTIL RASH IS CLEAR (Patient taking differently: Apply 1 Application topically daily as needed (Eczema).), Disp: 50 mL, Rfl: 0   Coenzyme Q10 100 MG capsule, Take 100 mg by mouth daily., Disp: , Rfl:    Famotidine (ACID CONTROLLER PO), Take by mouth daily., Disp: , Rfl:    metoprolol succinate (TOPROL-XL) 25 MG 24 hr tablet, Take 1 tablet (25 mg total) by mouth daily. Take with or immediately following a meal., Disp: 90 tablet, Rfl: 3   ondansetron (ZOFRAN) 4 MG tablet, Take 1 tablet (4 mg total) by mouth every 8 (eight) hours as needed for nausea or vomiting., Disp: 20 tablet, Rfl: 0   rosuvastatin (CRESTOR) 10 MG tablet, Take 1 tablet (10 mg total) by mouth daily., Disp: 90 tablet, Rfl: 1   tirzepatide (ZEPBOUND) 7.5 MG/0.5ML Pen, Inject 7.5 mg into Kathy skin once a week., Disp: 6 mL, Rfl: 2  Clinical Interview:   Kathy following information was obtained during a clinical interview with Kathy Long prior to cognitive testing.  Cognitive Symptoms: Decreased short-term memory: Endorsed. However, difficulties largely surrounded trouble with word finding and coming up with information on Kathy spot. Words were generally said to come with time and she benefits from cueing. Other more traditional concerns surrounding rapid forgetting were not reported. Difficulties were said to be present for Kathy past 12-18 months and have seemed stable over time.  Decreased long-term memory: Denied. Decreased attention/concentration: Endorsed. She reported a likely longstanding weakness surrounding attention and distractibility but did not feel that these abilities had progressively worsened over time.  Reduced processing speed: Denied. Difficulties with executive functions: Denied. She also denied trouble with impulsivity or any significant personality changes.  Difficulties with emotion  regulation: Denied. Difficulties with receptive language: Denied. Difficulties with word finding: Endorsed. Decreased visuoperceptual ability: Denied.  Difficulties completing ADLs: Denied.  Additional Medical History: History of traumatic brain injury/concussion: Denied. History of stroke: Denied. History of seizure activity: Denied. History of known exposure to toxins: Denied. Symptoms of chronic pain: Denied. Experience of frequent headaches/migraines: Denied. Frequent instances of dizziness/vertigo: Denied.  Sensory changes: She has cataracts and noted some visual acuity concerns. She denied difficulties reading or seeing well enough to drive and navigate her environment. She utilizes hearing aids with some benefit. Other sensory changes/difficulties (e.g., taste and smell) were denied.  Balance/coordination difficulties: Endorsed. Generally mild instability was attributed to a prior foot surgery, as well as her acoustic neuroma. She has been engaged in PT and vestibular therapy in Kathy past with fairly minimal benefit. She did report that balance has seemed to somewhat improve over time correlating with recovery from her foot surgery. No recent falls were reported.  Other motor difficulties: Denied.  Sleep History: Estimated hours obtained each night: 6-7 hours.  Difficulties falling asleep: Endorsed. Difficulties staying asleep: Denied. Feels rested and refreshed upon awakening: Denied. She reported waking generally feeling fatigued.   History of snoring: Endorsed. History of waking up gasping for air: Endorsed. Witnessed breath cessation while asleep: Endorsed. She has a history of obstructive sleep apnea. She trialed a CPAP machine in Kathy remote past but could not tolerate this device. She sleeps  with a mouthguard device. She noted recently losing approximately 40 pounds and is scheduled for an updated sleep study in Kathy next several weeks.  History of vivid dreaming:  Denied. Excessive movement while asleep: Denied. Instances of acting out her dreams: Denied.  Psychiatric/Behavioral Health History: Depression: She has a somewhat longstanding history of depressive symptoms, dating back to 2016. She takes medication, which was said to generally be helpful in managing outward symptoms. She described her current mood as "fine" outside of disappointment surrounding Kathy recent election results. Current or remote suicidal ideation, intent, or plan was denied.  Anxiety: Denied. Mania: Denied. Trauma History: Denied. Visual/auditory hallucinations: Denied. There has been a history of visual hallucinations as alluded to above and described in her medical records. These have been fully formed in nature and have seemed to surround sleep and/or medication side effects. She noted back in March that hallucinations had stopped after she stopped taking a melatonin supplement. They have not returned in Kathy interim.  Delusional thoughts: Denied.  Tobacco: Denied. Alcohol: She reported infrequent alcohol consumption in social settings and denied a history of problematic alcohol abuse or dependence.  Recreational drugs: Denied.  Family History: Problem Relation Age of Onset   Cancer Sister 51       ovarian ca   Breast cancer Other    Alzheimer's disease Maternal Uncle        x2   This information was confirmed by Kathy Long.  Academic/Vocational History: Highest level of educational attainment: 20 years. She earned her doctorate degree in educational administration and described herself as a good (A/B) student in academic settings. Math was noted as a likely weakness in earlier academic settings.  History of developmental delay: Denied. History of grade repetition: Denied. Enrollment in special education courses: Denied. History of LD/ADHD: Denied.  Employment: Retired. 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 After this, she worked in [employer] for her  home district. 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   Evaluation Results:   Behavioral Observations: Kathy Long was unaccompanied, arrived to her appointment on time, and was appropriately dressed and groomed. She appeared alert and oriented. Observed gait and station were within normal limits. Gross motor functioning appeared intact upon informal observation and no abnormal movements (e.g., tremors) were noted. Her affect was generally relaxed and positive. Spontaneous speech was fluent and word finding difficulties were not observed during Kathy clinical interview. Thought processes were coherent, organized, and normal in content. Insight into her cognitive difficulties appeared adequate.   During testing, sustained attention was appropriate. Task engagement was adequate and she persisted when challenged. Overall, Kathy Long was cooperative with Kathy clinical interview and subsequent testing procedures.   Adequacy of Effort: Kathy validity of neuropsychological testing is limited by Kathy extent to which Kathy individual being tested may be assumed to have exerted adequate effort during testing. Kathy Long expressed her intention to perform to Kathy best of her abilities and exhibited adequate task engagement and persistence. Scores across stand-alone and embedded performance validity measures were within expectation. As such, Kathy results of Kathy current evaluation are believed to be a valid representation of Dr. Adaline Long current cognitive functioning.  Test Results: Kathy Long was largely oriented at Kathy time of Kathy current evaluation. She was two days off when stating Kathy current date.   Intellectual abilities based upon educational and  vocational attainment were estimated to be in Kathy above average range. Premorbid abilities were estimated to be within Kathy above average  range based upon a single-word reading test.   Processing speed was below average to average. Basic attention was average. More complex attention (e.g., working memory) was well below average. Executive functioning was mildly variable but overall adequate, ranging from Kathy below average to above average normative ranges. She performed in Kathy well above average range across a task assessing safety and judgment.   Assessed receptive language abilities were above average. Likewise, Kathy Long did not exhibit any difficulties comprehending task instructions and answered all questions asked of her appropriately. Assessed expressive language (e.g., verbal fluency and confrontation naming) was below average to average.     Assessed visuospatial/visuoconstructional abilities were average to above average.    Learning (i.e., encoding) of novel verbal and visual information was mildly variable but overall adequate, ranging from Kathy below average to exceptionally high normative ranges. Spontaneous delayed recall (i.e., retrieval) of previously learned information was exceptionally low across a visual task and below average to above average across verbal tasks. Retention rates were 89% across a story learning task, 100% across a list learning task, 94% across a daily living task, and 33% across a shape learning task. Performance across recognition tasks was average to above average, suggesting evidence for information consolidation.   Results of emotional screening instruments suggested that recent symptoms of generalized anxiety were in Kathy moderate range, while symptoms of depression were within normal limits. A screening instrument assessing recent sleep quality suggested Kathy presence of mild sleep dysfunction.  Tables of Scores:   Note: This summary of test scores accompanies Kathy interpretive report and should not be considered in isolation without reference to Kathy appropriate sections in Kathy text. Descriptors  are based on appropriate normative data and may be adjusted based on clinical judgment. Terms such as "Within Normal Limits" and "Outside Normal Limits" are used when a more specific description of Kathy test score cannot be determined.       Percentile - Normative Descriptor > 98 - Exceptionally High 91-97 - Well Above Average 75-90 - Above Average 25-74 - Average 9-24 - Below Average 2-8 - Well Below Average < 2 - Exceptionally Low       Validity:   DESCRIPTOR       DCT: --- --- Within Normal Limits  NAB EVI: --- --- Within Normal Limits  D-KEFS Color Word EI: --- --- Within Normal Limits       Orientation:      Raw Score Percentile   NAB Orientation, Form 1 27/29 --- ---       Cognitive Screening:      Raw Score Percentile   SLUMS: 26/30 --- ---       Intellectual Functioning:      Standard Score Percentile   Test of Premorbid Functioning: 117 87 Above Average       Memory:     NAB Memory Module, Form 1: Standard Score/ T Score Percentile   Total Memory Index 93 32 Average  List Learning       Total Trials 1-3 21/36 (42) 21 Below Average    List B 5/12 (52) 58 Average    Short Delay Free Recall 6/12 (40) 16 Below Average    Long Delay Free Recall 6/12 (41) 18 Below Average    Retention Percentage 100 (52) 58 Average    Recognition Discriminability 6 (48) 42 Average  Shape Learning  Total Trials 1-3 14/27 (44) 27 Average    Delayed Recall 2/9 (23) <1 Exceptionally Low    Retention Percentage 33 (31) 3 Well Below Average    Recognition Discriminability 6 (47) 38 Average  Story Learning       Immediate Recall 68/80 (53) 62 Average    Delayed Recall 33/40 (47) 38 Average    Retention Percentage 89 (48) 42 Average  Daily Living Memory       Immediate Recall 51/51 (74) 99 Exceptionally High    Delayed Recall 16/17 (60) 84 Above Average    Retention Percentage 94 (56) 73 Average    Recognition Hits 10/10 (61) 86 Above Average       Attention/Executive Function:      Trail Making Test (TMT): Raw Score (T Score) Percentile     Part A 33 secs.,  0 errors (50) 50 Average    Part B 99 secs.,  1 error (43) 25 Average         Scaled Score Percentile   WAIS-IV Coding: 9 37 Average       NAB Attention Module, Form 1: T Score Percentile     Digits Forward 45 31 Average    Digits Backwards 33 5 Well Below Average        Scaled Score Percentile   WAIS-IV Similarities: 12 75 Above Average       D-KEFS Color-Word Interference Test: Raw Score (Scaled Score) Percentile     Color Naming 40 secs. (7) 16 Below Average    Word Reading 25 secs. (10) 50 Average    Inhibition 97 secs. (6) 9 Below Average      Total Errors 2 errors (11) 63 Average    Inhibition/Switching 78 secs. (10) 50 Average      Total Errors 4 errors (9) 37 Average       D-KEFS Verbal Fluency Test: Raw Score (Scaled Score) Percentile     Letter Total Correct 36 (10) 50 Average    Category Total Correct 35 (11) 63 Average    Category Switching Total Correct 10 (8) 25 Average    Category Switching Accuracy 8 (7) 16 Below Average      Total Set Loss Errors 2 (10) 50 Average      Total Repetition Errors 0 (13) 84 Above Average       NAB Executive Functions Module, Form 1: T Score Percentile     Judgment 65 93 Well Above Average       Language:     Verbal Fluency Test: Raw Score (T Score) Percentile     Phonemic Fluency (FAS) 36 (41) 18 Below Average    Animal Fluency 17 (43) 25 Average        NAB Language Module, Form 1: T Score Percentile     Auditory Comprehension 57 75 Above Average    Naming 29/31 (44) 27 Average       Visuospatial/Visuoconstruction:      Raw Score Percentile   Clock Drawing: 10/10 --- Within Normal Limits       NAB Spatial Module, Form 1: T Score Percentile     Figure Drawing Copy 57 75 Above Average        Scaled Score Percentile   WAIS-IV Block Design: 8 25 Average       Mood and Personality:      Raw Score Percentile   Beck Depression Inventory -  II: 10 --- Within Normal Limits  PROMIS Anxiety Questionnaire: 25 --- Moderate  Additional Questionnaires:      Raw Score Percentile   PROMIS Sleep Disturbance Questionnaire: 28 --- Mild   Informed Consent and Coding/Compliance:   Kathy current evaluation represents a clinical evaluation for Kathy purposes previously outlined by Kathy referral source and is in no way reflective of a forensic evaluation.   Kathy Long was provided with a verbal description of Kathy nature and purpose of Kathy present neuropsychological evaluation. Also reviewed were Kathy foreseeable risks and/or discomforts and benefits of Kathy procedure, limits of confidentiality, and mandatory reporting requirements of this provider. Kathy patient was given Kathy opportunity to ask questions and receive answers about Kathy evaluation. Oral consent to participate was provided by Kathy patient.   This evaluation was conducted by Newman Nickels, Ph.D., ABPP-CN, board certified clinical neuropsychologist. Kathy Long completed a clinical interview with Dr. Milbert Coulter, billed as one unit [phone], and 140 minutes of cognitive testing and scoring, billed as one unit [phone] and four additional units [id number]. Psychometrist Wallace Keller, B.S. assisted Dr. Milbert Coulter with test administration and scoring procedures. As a separate and discrete service, one unit [id number] and two units [phone] were billed for Dr. Tammy Sours time spent in interpretation and report writing.

## 2023-04-07 NOTE — Progress Notes (Signed)
   Psychometrician Note   Cognitive testing was administered to [employer] by Wallace Keller, B.S. (psychometrist) under the supervision of Dr. Newman Nickels, Ph.D., licensed psychologist on 04/07/2023. Ms. Tschida did not appear overtly distressed by the testing session per behavioral observation or responses across self-report questionnaires. Rest breaks were offered.    The battery of tests administered was selected by Dr. Newman Nickels, Ph.D. with consideration to Ms. Pascal's current level of functioning, the nature of her symptoms, emotional and behavioral responses during interview, level of literacy, observed level of motivation/effort, and the nature of the referral question. This battery was communicated to the psychometrist. Communication between Dr. Newman Nickels, Ph.D. and the psychometrist was ongoing throughout the evaluation and Dr. Newman Nickels, Ph.D. was immediately accessible at all times. Dr. Newman Nickels, Ph.D. provided supervision to the psychometrist on the date of this service to the extent necessary to assure the quality of all services provided.    Woodfin Ganja will return within approximately 1-2 weeks for an interactive feedback session with Dr. Milbert Coulter at which time her test performances, clinical impressions, and treatment recommendations will be reviewed in detail. Ms. Caulfield understands she can contact our office should she require our assistance before this time.  A total of 140 minutes of billable time were spent face-to-face with Ms. Emerick by the psychometrist. This includes both test administration and scoring time. Billing for these services is reflected in the clinical report generated by Dr. Newman Nickels, Ph.D.  This note reflects time spent with the psychometrician and does not include test scores or any clinical interpretations made by Dr. Milbert Coulter. The full report will follow in a separate note.

## 2023-04-12 ENCOUNTER — Other Ambulatory Visit: Payer: Self-pay | Admitting: Internal Medicine

## 2023-04-12 DIAGNOSIS — H26491 Other secondary cataract, right eye: Secondary | ICD-10-CM | POA: Diagnosis not present

## 2023-04-13 NOTE — Telephone Encounter (Signed)
Error

## 2023-04-14 ENCOUNTER — Ambulatory Visit: Payer: Medicare PPO | Admitting: Psychology

## 2023-04-14 DIAGNOSIS — F09 Unspecified mental disorder due to known physiological condition: Secondary | ICD-10-CM

## 2023-04-14 DIAGNOSIS — I679 Cerebrovascular disease, unspecified: Secondary | ICD-10-CM

## 2023-04-14 NOTE — Progress Notes (Signed)
   Neuropsychology Feedback Session Kathy Long. [facility] Department of Neurology  Reason for Referral:   Kathy Long is a 72 y.o. left-handed Caucasian female referred by Kathy Kays, PA-C, to characterize her current cognitive functioning and assist with diagnostic clarity and treatment planning in the context of subjective cognitive decline.   Feedback:   Kathy Long completed a comprehensive neuropsychological evaluation on 04/07/2023. Please refer to that encounter for the full report and recommendations. Briefly, results suggested isolated impairments surrounding complex attention and delayed retrieval aspects of visual memory. Performances were appropriate relative to age-matched peers across all other assessed domains. Isolated deficits across testing are nonspecific in nature. As such, the underlying etiology for said deficits remains unclear. She reported cataracts and ongoing visual acuity concerns which could at least partially explain deficits across a visual memory task as these shapes are fairly abstract and detailed. 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 There remains the potential that this, when combined with mild psychiatric distress and other biopsychosocial stressors, medical comorbidities, and medication side effects (i.e., Xanax), could explain some ongoing dysfunction. Neurologically speaking, her pattern of testing, when combined with a history of fully-formed visual hallucinations, could raise some concern for underlying Lewy body disease. However, current testing patterns do not align in a strongly compelling fashion, visual acuity concerns may have impacted visual performances, hallucinations may simply be related to medication side effects, and she does not display other parkinsonian symptoms which would elevate concerns. This seems unlikely based upon currently available information  and symptom reporting. Outside of her isolated weakness across a shape learning task, retention rates across three verbal tasks ranged from 89% to 100%. Additionally, performances across all recognition/consolidation tasks (including the shape learning task) were consistently appropriate. This does not suggest rapid forgetting or an evolving storage impairment and current testing performances are not consistent with symptomatic Alzheimer's disease at the present time.  Kathy Long was unaccompanied during the current feedback session. Content of the current session focused on the results of her neuropsychological evaluation. Kathy Long was given the opportunity to ask questions and her questions were answered. She was encouraged to reach out should additional questions arise. A copy of her report was provided at the conclusion of the visit.      One unit [phone] was billed for Kathy Long time spent preparing for, conducting, and documenting the current feedback session with Kathy Long.

## 2023-05-03 DIAGNOSIS — Z0100 Encounter for examination of eyes and vision without abnormal findings: Secondary | ICD-10-CM | POA: Diagnosis not present

## 2023-06-03 ENCOUNTER — Telehealth: Payer: Self-pay | Admitting: Internal Medicine

## 2023-06-03 NOTE — Telephone Encounter (Signed)
 Copied from CRM [id number]. Topic: Medicare AWV >> Jun 03, 2023  2:33 PM Nathanel DEL wrote: Reason for CRM: Called LVM 06/03/2023 to schedule AWV. Please schedule office or virtual visits.  Nathanel Paschal; Care Guide Ambulatory Clinical Support [facility] Direct Dial: [phone]

## 2023-06-15 ENCOUNTER — Ambulatory Visit: Payer: Medicare PPO | Admitting: Sports Medicine

## 2023-06-15 ENCOUNTER — Ambulatory Visit (INDEPENDENT_AMBULATORY_CARE_PROVIDER_SITE_OTHER): Payer: Medicare PPO

## 2023-06-15 VITALS — BP 126/78 | Ht 62.0 in | Wt 141.0 lb

## 2023-06-15 DIAGNOSIS — M25552 Pain in left hip: Secondary | ICD-10-CM

## 2023-06-15 DIAGNOSIS — M1612 Unilateral primary osteoarthritis, left hip: Secondary | ICD-10-CM | POA: Diagnosis not present

## 2023-06-15 DIAGNOSIS — M25511 Pain in right shoulder: Secondary | ICD-10-CM

## 2023-06-15 DIAGNOSIS — S46812A Strain of other muscles, fascia and tendons at shoulder and upper arm level, left arm, initial encounter: Secondary | ICD-10-CM | POA: Diagnosis not present

## 2023-06-15 DIAGNOSIS — M7062 Trochanteric bursitis, left hip: Secondary | ICD-10-CM

## 2023-06-15 DIAGNOSIS — G8929 Other chronic pain: Secondary | ICD-10-CM

## 2023-06-15 DIAGNOSIS — S46811A Strain of other muscles, fascia and tendons at shoulder and upper arm level, right arm, initial encounter: Secondary | ICD-10-CM

## 2023-06-15 DIAGNOSIS — M25512 Pain in left shoulder: Secondary | ICD-10-CM | POA: Diagnosis not present

## 2023-06-15 NOTE — Patient Instructions (Signed)
 Tylenol  [phone] mg 2-3 times a day for pain relief  Voltaren  gel over areas of pain  HEP  PT referral  4-6 week follow up
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
## 2023-06-16 ENCOUNTER — Telehealth: Payer: Self-pay

## 2023-06-16 ENCOUNTER — Other Ambulatory Visit (HOSPITAL_COMMUNITY): Payer: Self-pay

## 2023-06-16 NOTE — Telephone Encounter (Signed)
Pharmacy Patient Advocate Encounter  Received notification from [facility] that Prior Authorization for Zepbound 7.5 mg/ml pens has been DENIED.  Full denial letter will be uploaded to the media tab. See denial reason below.   PA #/Case ID/Reference #: --    *medications for weight loss are an excluded product for Part D members

## 2023-06-16 NOTE — Telephone Encounter (Signed)
Can an appeal be done?

## 2023-06-17 ENCOUNTER — Encounter: Payer: Self-pay | Admitting: Internal Medicine

## 2023-06-17 ENCOUNTER — Telehealth: Payer: Self-pay

## 2023-06-17 NOTE — Telephone Encounter (Signed)
Noted. Will wait for the response from Pharmacy team

## 2023-06-17 NOTE — Telephone Encounter (Signed)
 error

## 2023-06-21 ENCOUNTER — Other Ambulatory Visit (HOSPITAL_COMMUNITY): Payer: Self-pay

## 2023-06-21 NOTE — Telephone Encounter (Signed)
Please see message attached from pt. Pt needs pa for Zepbound

## 2023-06-22 ENCOUNTER — Telehealth: Payer: Self-pay

## 2023-06-22 NOTE — Telephone Encounter (Signed)
Copied from CRM [id number]. Topic: Clinical - Medication Question >> Jun 22, 2023  2:04 PM Drema Balzarine wrote: Reason for CRM: [facility] request call back - they have clinical questions regarding patients Zepbound.

## 2023-06-22 NOTE — Telephone Encounter (Signed)
Additional information has been requested from the patient's insurance in order to proceed with the prior authorization request. Requested information has been sent, or form has been filled out and faxed back to [phone]

## 2023-06-23 ENCOUNTER — Encounter: Payer: Self-pay | Admitting: Sports Medicine

## 2023-06-23 NOTE — Addendum Note (Signed)
Addended by: Richardean Sale on: 06/23/2023 09:23 AM   Modules accepted: Orders

## 2023-06-23 NOTE — Addendum Note (Signed)
Addended by: Norberto Sorenson on: 06/23/2023 09:16 AM   Modules accepted: Orders

## 2023-06-27 ENCOUNTER — Telehealth: Payer: Self-pay | Admitting: Pharmacist

## 2023-06-27 NOTE — Telephone Encounter (Signed)
Appeal has been submitted for Zepbound. Will advise when response, please be advised that most companies may take 30 days to make a decision. The appeal letter and all supporting documentation has been faxed to [phone] on 06/27/2023 @10 :20 am.  Thank you, Dellie Burns, PharmD Clinical Pharmacist  [place]  Direct Dial: [phone]    Original denial reason:

## 2023-06-28 ENCOUNTER — Other Ambulatory Visit (HOSPITAL_COMMUNITY): Payer: Self-pay

## 2023-06-28 NOTE — Telephone Encounter (Signed)
Returned the call, all documentation has been received and the case is currently under review.

## 2023-06-30 NOTE — Telephone Encounter (Signed)
LMTCB

## 2023-06-30 NOTE — Telephone Encounter (Signed)
Notification was received from [facility] that the appeal was successful and Zepbound is now covered for the patient effective 06/01/2023 through 05/30/2024.   Thank you, Dellie Burns, PharmD Clinical Pharmacist  [place]  Direct Dial: [phone]

## 2023-07-01 DIAGNOSIS — H903 Sensorineural hearing loss, bilateral: Secondary | ICD-10-CM | POA: Diagnosis not present

## 2023-07-05 DIAGNOSIS — M8580 Other specified disorders of bone density and structure, unspecified site: Secondary | ICD-10-CM | POA: Diagnosis not present
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
## 2023-07-13 ENCOUNTER — Ambulatory Visit: Payer: Medicare PPO | Admitting: Sports Medicine

## 2023-07-19 ENCOUNTER — Ambulatory Visit: Payer: Medicare PPO | Admitting: Psychiatry

## 2023-07-19 ENCOUNTER — Telehealth: Payer: Self-pay | Admitting: Internal Medicine

## 2023-07-19 DIAGNOSIS — F411 Generalized anxiety disorder: Secondary | ICD-10-CM

## 2023-07-19 NOTE — Progress Notes (Signed)
[facility] Initial Adult Exam  Name: Kathy Long Date: 07/19/2023 MRN: [id number] DOB: [birth year] PCP: Sherlene Shams, MD  Time spent: 60 minutes   Guardian/Payee:  patient    Paperwork requested:  No   Reason for Visit /Presenting Problem: anxiety, stressed, "some bouts of occasional sadness", "there is history of depression in my family with other family members not patient  Mental Status Exam:    Appearance:   Casual and Neat     Behavior:  Appropriate, Sharing, and Motivated  Motor:  Normal  Speech/Language:   Clear and Coherent  Affect:  Depressed and anxiety  Mood:  anxious  Thought process:  goal directed  Thought content:    WNL  Sensory/Perceptual disturbances:    WNL  Orientation:  oriented to person, place, time/date, situation, day of week, month of year, year, and stated date of Feb. 18, 2025  Attention:  Good  Concentration:  Good  Memory:  Some short term issues "and has seen a doctor about this but doctor did not feel I had any concerns nor needed any meds  Fund of knowledge:   Good and Fair  Insight:    Good  Judgment:   Good  Impulse Control:  Good and Fair   Reported Symptoms:  see above symptoms  Risk Assessment: Danger to Self:  No Self-injurious Behavior: No Danger to Others: No Duty to Warn:no Physical Aggression / Violence:No  Access to Firearms a concern: No  Gang Involvement:No  Patient / guardian was educated about steps to take if suicide or homicide risk level increases between visits: yes While future psychiatric events cannot be accurately predicted, the patient does not currently require acute inpatient psychiatric care and does not currently meet [facility] involuntary commitment criteria.  Substance Abuse History: Current substance abuse: No     Past Psychiatric History:   No previous psychological problems have been observed Outpatient Providers:n/a History of Psych Hospitalization: No  Psychological Testing:   n/a    Abuse History: Victim of No.,  n/a    Report needed: No. Victim of Neglect:No. Perpetrator of  n/a   Witness / Exposure to Domestic Violence: No   Protective Services Involvement: No  Witness to [employer] Violence:  No   Family History: patient confirms Family History  Problem Relation Age of Onset   Cancer Sister 58       ovarian ca   Breast cancer Other    Alzheimer's disease Maternal Uncle        x2    Living situation: the patient lives with their spouse  Sexual Orientation:  Straight  Relationship Status: married 53 yrs  Name of spouse / other: n/a             If a parent, number of children / ages: has 1 son (age 42) and lives in [place], and 2 daughters ages 60 living in [place], [place] and other one is 81 and living in [place]. Grandchildren includes 5 boys and 1 girl. Able to see grandkids fairly often. Has 2 sisters living but doesn't see them often, and feels close to 1 of them.   Support Systems; spouse friends  Surveyor, quantity Stress:  No   Income/Employment/Disability: Dance movement psychotherapist and Occupational psychologist Service: No   Educational History: Education: post college graduate work or degree  Religion/Sprituality/World View:   Protestant  Any cultural differences that may affect / interfere with treatment:  not applicable   Recreation/Hobbies: travel, reading  Stressors:Other: "the world  and all the craziness going on today"    Strengths:  Supportive Relationships, Family, Friends, Church, Spirituality, Journalist, newspaper, Able to Communicate Effectively, and try to be hopeful  Barriers:  "I need strategies to manage stress and anxiety. I don't sleep well because I start thinking about all the what if's and that leads me down to rabbit holes." "Feels concerns about our world and where it is heading". I need strategies to keep my thoughts calmed down.   Legal History: Pending legal issue / charges: The patient has no significant history of  legal issues. History of legal issue / charges:  none  Medical History/Surgical History:Reviewed with patient and she confirms except now wears hearing aids in both ears. 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 There is tumor extending from  the cerebellar pontine angle cistern to the fundus of the internal  auditory canal. No mass-effect on the brainstem.     Adiposity 09/28/2014   Body mass index is 31.45 kg/(m^2).     Last Assessment & Plan:   I have addressed  BMI and recommended wt loss of 10% of body weight over the next 6 months using a low glycemic index diet and regular exercise a minimum of 5 days per week.     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 Has been having foot pain since May 2022.     CKD (chronic kidney disease) stage 3, GFR 30-59 ml/min 04/13/2018   Nephrology referral recommended.      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   Postoperative bile leak    Right shoulder pain 10/17/2019   S/P laparoscopic cholecystectomy 07/19/2017   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 10/21/11     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     Past Surgical History:  Procedure Laterality Date   ANKLE FRACTURE SURGERY Left    APPENDECTOMY  2010   ATRIAL FIBRILLATION ABLATION N/A 09/09/2022   Procedure:  ATRIAL FIBRILLATION ABLATION;  Surgeon: Lanier Prude, MD;  Location: [facility] INVASIVE CV LAB;  Service: Cardiovascular;  Laterality: N/A;   BACK SURGERY     disectomy lumbar, scar tissue   BREAST CYST EXCISION Right 1970   axilla   buninectomy Bilateral    CATARACT EXTRACTION W/PHACO Left 04/12/2018   Procedure: CATARACT EXTRACTION PHACO AND INTRAOCULAR LENS PLACEMENT (IOC)  LEFT TORIC LENS;  Surgeon: Lockie Mola, MD;  Location: [facility];  Service: Ophthalmology;  Laterality: Left;   CATARACT EXTRACTION W/PHACO Right 05/10/2018   Procedure: CATARACT EXTRACTION PHACO AND INTRAOCULAR LENS PLACEMENT (IOC)  RIGHT TORIC LENS;  Surgeon: Lockie Mola, MD;  Location: [facility];  Service: Ophthalmology;  Laterality: Right;  DIABETIC   CHOLECYSTECTOMY N/A 07/05/2017   Procedure: LAPAROSCOPIC CHOLECYSTECTOMY WITH INTRAOPERATIVE CHOLANGIOGRAM;  Surgeon: Earline Mayotte, MD;  Location: [facility] ORS;  Service: General;  Laterality: N/A;   COLONOSCOPY     2009 and color gaurd in 2018   COLONOSCOPY N/A 03/25/2020    Procedure: COLONOSCOPY;  Surgeon: Regis Bill, MD;  Location: [facility] ENDOSCOPY;  Service: Endoscopy;  Laterality: N/A;   DRUG INDUCED ENDOSCOPY N/A 05/06/2021   Procedure: DRUG INDUCED SLEEP ENDOSCOPY;  Surgeon: Christia Reading, MD;  Location: [facility];  Service: ENT;  Laterality: N/A;   ERCP N/A 07/12/2017   Procedure: ENDOSCOPIC RETROGRADE CHOLANGIOPANCREATOGRAPHY (ERCP);  Surgeon: Midge Minium, MD;  Location: [facility] END[facility];  Service: Endoscopy;  Laterality: N/A;   ERCP N/A 10/04/2017   Procedure: ENDOSCOPIC RETROGRADE CHOLANGIOPANCREATOGRAPHY (ERCP);  Surgeon: Midge Minium, MD;  Location: [facility] ENDOSCOPY;  Service: Endoscopy;  Laterality: N/A;   FOOT SURGERY     x 2   INCISIONAL HERNIA REPAIR N/A 12/04/2020   Procedure: VENTRAL INCISIONAL HERNIA REPAIR WITH MESH;  Surgeon: Darnell Level, MD;  Location: [facility] ORS;  Service: General;  Laterality: N/A;   MELANOMA EXCISION Left 2000   PALATOPLASTY N/A 04/08/2015   Procedure: PALATOPLASTY;  Surgeon: Linus Salmons, MD;  Location: [facility];  Service: ENT;  Laterality: N/A;   TONSILLECTOMY N/A 04/08/2015   Procedure: TONSILLECTOMY;  Surgeon: Linus Salmons, MD;  Location: [facility] ORS;  Service: ENT;  Laterality: N/A;   TUBAL LIGATION      Medications: Current Outpatient Medications  Medication Sig Dispense Refill   ALPRAZolam (XANAX) 0.25 MG tablet TAKE ONE TABLET BY MOUTH AT BEDTIME 30 tablet 5   amLODipine (NORVASC) 2.5 MG tablet TAKE ONE TABLET (2.5 MG) BY MOUTH EVERY DAY 90 tablet 1   apixaban (ELIQUIS) 5 MG TABS tablet Take 1 tablet (5 mg total) by mouth 2 (two) times daily. 180 tablet 3   benzonatate (TESSALON) 100 MG capsule Take 2 capsules (200 mg total) by mouth 3 (three) times daily as needed for cough. 30 capsule 0   Cholecalciferol 125 MCG (5000 UT) TABS Take 5,000 Units by mouth in the morning.     citalopram (CELEXA) 20 MG tablet TAKE 1 TABLET BY MOUTH DAILY 90 tablet 1   clobetasol (TEMOVATE) 0.05 % external  solution APPLY DAILY UNTIL RASH IS CLEAR (Patient taking differently: Apply 1 Application topically daily as needed (Eczema).) 50 mL 0   Coenzyme Q10 100 MG capsule Take 100 mg by mouth daily.     Famotidine (ACID CONTROLLER PO) Take by mouth daily.     metoprolol succinate (TOPROL-XL) 25 MG 24 hr tablet Take 1 tablet (25 mg total) by mouth daily. Take with or immediately following a meal. 90 tablet 3   ondansetron (ZOFRAN)  4 MG tablet TAKE 1 TABLET BY MOUTH EVERY 8 HOURS AS NEEDED FOR NAUSEA OR VOMITING 20 tablet 0   rosuvastatin (CRESTOR) 10 MG tablet Take 1 tablet (10 mg total) by mouth daily. 90 tablet 1   tirzepatide (ZEPBOUND) 7.5 MG/0.5ML Pen Inject 7.5 mg into the skin once a week. 6 mL 2   No current facility-administered medications for this visit.    Allergies  Allergen Reactions   Morphine And Codeine Other (See Comments)    Chest pain   Tramadol Other (See Comments)    tremors   Tape Rash     Surgical tape    Diagnoses:    ICD-10-CM   1. Generalized anxiety disorder  F41.1       Treatment goal plan of care:  Worked with patient collaboratively on her treatment plan and she is in agreement with it.  Her goals will remain on treatment plan as patient works with strategies in sessions and outside of sessions to meet her goals.  Progress is assessed each session and documented in the "plan" or "progress" sections of treatment note.  1.Reduce overall level, frequency, and intensity of the anxiety so that daily functioning is not impaired. 2.Increase understanding of beliefs and messages that produce the worry and anxiety. 3.Explore cognitive messages that mediate anxiety response and retrain in adaptive cognitions.    Plan of Care:   This is first appointment with this patient and today we collaboratively completed her initial evaluation and initial treatment goal plan.  Loucile "Debbie" Vanderheiden is a 73 year old, married for 53 years to her husband.  Both patient and has been  are retired.  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  Her husband previously worked at [employer] prior to his retirement.  He continues to work part-time job now.  Patient and her husband have 3 adult children including: 1 son, age 82 and lives in [place] with his wife.  They have 2 daughters including 1, who is 32 and lives in [place] with her family, and another daughter who is 65 and lives in [place] with her family.  They have several grandchildren including 5 boys and 1 girl.  Patient reports being able to see their grandchildren fairly often.  She also has 2 sisters living but does not see them often however does feel close to one of them.  Patient acknowledges that she tends to talk excessively and has been told this by other people and it was observed today as we work together on her initial evaluation.  She reports getting along better with the older daughter and has had some confrontations with the younger daughter who "tells me I need to not talk so much and especially not talk about political issues and certain people including the President."  Was very talkative and evaluation today and more intently focused on the things that upset her in the world particularly having to do with political action and "those in authority."  Did note however that the ongoing talking also occurred with other issues as we work through gathering information for her initial evaluation.  Will be following up on these in future sessions.  Patient was casual and neat in her appearance, motor skills seem normal, behavior was talkative and motivated, affect included some depression and anxiety, denies any thoughts to harm self or others, mood was anxious, thought process is goal-directed, was oriented well  to person/place/time/date/situation/day of week/month of year/year and  stated date of July 19, 2023.  Her attention and concentration were good, memory she reports some short-term issues "but has seen a doctor about this and he did not feel had any concerns nor needed any meds for memory."  Patient's insight and judgment are relatively good, impulse control good/fair, denies any substance abuse, and denies any past psychiatric history.  She reports no financial stressors, her husband is very supportive, educational history includes college graduate and postgraduate work, is on Tree surgeon and pension, and states that her main stressor is "the world and all the craziness going on today especially certain political figures".  She reports her strengths to include family, friends, a sense of spirituality, a good self advocate, able to communicate effectively, supportive relationships, and "I try to be hopeful".  Patient reports her barriers as being "I need strategies to manage my stress and anxiety, I do not sleep well because I start thinking about what all can and might happen in all of the what if's that lead me down rabbit holes".  Feels concerns about our world and where it is heading.  "I need strategies to keep my thoughts calm down".  Patient reports no significant physical issues and does wear hearing aids in both ears.  She does seem motivated for treatment and was pleasant even when talking about some of her challenges that impacts her relationships within the family and others.  Has been in group counseling about 5 years ago with her 2 daughters although did not feel there was lasting benefit.  They were reportedly working on better communication.  States that her husband feels "I am obsessed with Garnet Koyanagi" and he tells me that I "do way too much for the grandkids and spend too much money on them".  Towards end of session patient stated "I want to be more emotionally healthy and enjoying my life more and have healthy relationships, I want to put my worries and  stress in perspective and do what I can to make needed changes, and wants to let go of the resistance she feels from younger daughter "who told me she was putting a boundary on me to not talk anymore about Dorinda Kyllo Trump".  Will be following up with patient on her concerns and goal-directed behaviors at next session.  Goal review and progress/challenges noted with patient.  Next appt within 2 weeks.    Mathis Fare, LCSW

## 2023-07-19 NOTE — Telephone Encounter (Signed)
Copied from CRM [id number]. Topic: Medicare AWV >> Jul 19, 2023  9:43 AM Payton Doughty wrote: Reason for CRM: Called LVM 07/19/2023 to schedule AWV. Please schedule office or virtual visits.  Verlee Rossetti; Care Guide Ambulatory Clinical Support [place] [facility] Direct Dial: [phone]

## 2023-07-26 ENCOUNTER — Ambulatory Visit: Payer: Self-pay | Admitting: Internal Medicine

## 2023-07-26 ENCOUNTER — Encounter: Payer: Self-pay | Admitting: Internal Medicine

## 2023-07-26 NOTE — Telephone Encounter (Signed)
 Copied from CRM [id number]. Topic: Clinical - Pink Word Triage >> Jul 26, 2023 10:13 AM Adaysia C wrote: Reason for Triage: Patient is experiencing high blood pressure levels Headaches Dizzines  Chief Complaint: elevated bp; headache, dizziness; 163/99; 184/105 previous. Symptoms: see above Frequency: constant but pressure goes down Pertinent Negatives: Patient denies numbness and weakness, cp, sob Disposition: [] ED /[] Urgent Care (no appt availability in office) / [x] Appointment(In office/virtual)/ []  [facility]/ [] Home Care/ [] Refused Recommended Disposition /[] [place] [facility]/ []  Follow-up with PCP Additional Notes: Has blood pressure log, per protocol.  Care advice given, denies questions, instructed to go to er if becomes worse.  PCP office updated  Reason for Disposition  Systolic BP  >= 180 OR Diastolic >= 110  Answer Assessment - Initial Assessment Questions 1. BLOOD PRESSURE: "What is the blood pressure?" "Did you take at least two measurements 5 minutes apart?"     163/99 2. ONSET: "When did you take your blood pressure?"     This am 3. HOW: "How did you take your blood pressure?" (e.g., automatic home BP monitor, visiting nurse)     automatic 4. HISTORY: "Do you have a history of high blood pressure?"     yes 5. MEDICINES: "Are you taking any medicines for blood pressure?" "Have you missed any doses recently?"     On medication and has not missed a dose.  6. OTHER SYMPTOMS: "Do you have any symptoms?" (e.g., blurred vision, chest pain, difficulty breathing, headache, weakness)     Dizziness and headache 7. PREGNANCY: "Is there any chance you are pregnant?" "When was your last menstrual period?"     na  Protocols used: Blood Pressure - High-A-AH

## 2023-07-26 NOTE — Telephone Encounter (Signed)
 Pt is scheduled to see you tomorrow for elevated blood pressure

## 2023-07-27 ENCOUNTER — Ambulatory Visit: Payer: Medicare PPO | Admitting: Internal Medicine

## 2023-07-27 ENCOUNTER — Ambulatory Visit (INDEPENDENT_AMBULATORY_CARE_PROVIDER_SITE_OTHER): Payer: Medicare PPO | Admitting: Psychiatry

## 2023-07-27 ENCOUNTER — Encounter: Payer: Self-pay | Admitting: Internal Medicine

## 2023-07-27 VITALS — BP 119/86 | HR 59 | Ht 62.0 in | Wt 149.0 lb

## 2023-07-27 DIAGNOSIS — I10 Essential (primary) hypertension: Secondary | ICD-10-CM

## 2023-07-27 DIAGNOSIS — E785 Hyperlipidemia, unspecified: Secondary | ICD-10-CM

## 2023-07-27 DIAGNOSIS — E663 Overweight: Secondary | ICD-10-CM | POA: Diagnosis not present

## 2023-07-27 DIAGNOSIS — Z23 Encounter for immunization: Secondary | ICD-10-CM | POA: Diagnosis not present

## 2023-07-27 DIAGNOSIS — Z6827 Body mass index (BMI) 27.0-27.9, adult: Secondary | ICD-10-CM | POA: Diagnosis not present

## 2023-07-27 DIAGNOSIS — F411 Generalized anxiety disorder: Secondary | ICD-10-CM

## 2023-07-27 NOTE — Patient Instructions (Signed)
 Resume your prior regimen of amlodipine 2.5 mg in the AM and 25 mg metoprolol in the evening  Use Afrin/Flonase for congestion.  Nothing by mouth

## 2023-07-27 NOTE — Assessment & Plan Note (Signed)
 Recent elevations coincided with use of a "natural " supplement for congestion which she started taking one day prior to elevations.  Since she stopped the supplement the readings at home have trended down.  Advised to avoid all OTC decongestants and resume amlodipine 2.5 mg /metoprolol 25 mg daily .  Checking urine for protein today

## 2023-07-27 NOTE — Progress Notes (Signed)
 [facility] Counselor/Therapist Progress Note  Patient ID: Kathy Long, MRN: [id number],    Date: 07/27/2023  Time Spent: 55 minutes   Treatment Type: Individual Therapy  Reported Symptoms: anxiety, stressed, "some bouts of sadness especially upon awakening but gets better some when I wake up and get going", depressed, no SI, anger   Mental Status Exam:  Appearance:   Neat and Well Groomed     Behavior:  Appropriate, Sharing, and Motivated  Motor:  Normal  Speech/Language:   Clear and Coherent  Affect:  Depressed and anxious  Mood:  anxious and depressed; anger  Thought process:  goal directed  Thought content:    WNL  Sensory/Perceptual disturbances:    WNL  Orientation:  oriented to person, place, time/date, situation, day of week, month of year, year, and stated date of Feb. 26, 2025  Attention:  Good  Concentration:  Good  Memory:  "WNL" but forgets some things and words"  Fund of knowledge:   Good  Insight:    Good  Judgment:   Good  Impulse Control:  Good   Risk Assessment: Danger to Self:  No Self-injurious Behavior: No Danger to Others: No Duty to Warn:no Physical Aggression / Violence:No  Access to Firearms a concern: No  Gang Involvement:No   Subjective:       Patient in for follow-up session today after initial evaluation recently.  Focused more on her symptoms of note anxiety, stress, and occasional bouts of sadness".  Feels that her marriage to her husband of 53 years has been a relatively good marriage and they are able to enjoy their relationship and as needed are able to work out any issues or problems that arise.  Family including a daughter and her husband have had some concerns about patient being overly involved or "obsessed" with the political environment in our country now and especially the new Economist. "Overall goal is to better manage and have strategies to deal with my anger, stress, and anger." States "I need to talk more today about some  history that led to my being here now for help." Shared some family and friend history, including opinions and ways of looking at things that were very different and fed some conflict and hard feelings. "Dad's death was hard, I was 19 and he was 59 at the time of his death which was very hard for me." Things "got worse over time and there were lots of contributing factors to the relationship issues within the family currently."  Very verbal about "I need to start doing what I can to help social justice ."  Very talkative in session about her and family's history, and her struggle managing tensions and things " where I disagree strongly. 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 Talking and not feeling very heard by others.  Needing to vent concerns within her family and within certain friend connections including 1 involving an adult child suicide in the past few days.  Did some preliminary work today regarding strategies for managing stress and anxiety and hopefully can help her in getting some better sleep.  Talks further about her tendency to talk about "what can and might happen and all the "what if's" involved.  Admits more today about her tendency to "go down rabbit holes", but also finding it hard to stop.  Working with patient some on this today but will continue as we work together in sessions.  Feels people do not understand her when they tell her that she is "obsessed with the president".  Does make a clear statement that she wants to become more emotionally healthy and be able to have healthier relationships, and enjoy life more.  Adds again today that she wants to have a better perspective on some of the worries and stress that she has in life and willing to work to make changes for that to happen.  Still angry/frustrated with 1 daughter and a comment she made about "putting a boundary on me" and wants to talk about this more next session.  Interventions:  Cognitive Behavioral Therapy, Ego-Supportive, and Insight-Oriented  1.Reduce overall level, frequency, and intensity of the anxiety so that daily functioning is not impaired. 2.Increase understanding of beliefs and messages that produce the worry and anxiety. 3.Explore cognitive messages that mediate anxiety response and retrain in adaptive cognitions.    Diagnosis:   ICD-10-CM   1. Generalized anxiety disorder  F41.1      Plan:   Patient today in session and talking more openly about her anxiety symptoms along with her stress and occasional bouts of sadness, and a much more detailed account of her family life situation that impacts her heavily right now.  (Not all details included in this note due to patient privacy needs.) She is showing good motivation and working well in sessions, able to readily accept some occasional non-pressured redirection as needed to help her stay on track with her treatment goals and make the best use of her time in sessions. Patient has shown progress and needs to continue working with her goal-directed behaviors in order to move in a forward and more positive direction.    Goal review and progress/challenges noted with patient.  Next appointment within 2 weeks.   Mathis Fare, LCSW

## 2023-07-27 NOTE — Progress Notes (Unsigned)
 Subjective:  Patient ID: Kathy Long, female    DOB: 07-17-1950  Age: 73 y.o. MRN: [id number]  CC: The primary encounter diagnosis was Essential hypertension. Diagnoses of Overweight (BMI 25.0-29.9), Hyperlipidemia LDL goal <100, and Need for influenza vaccination were also pertinent to this visit.   HPI Kathy Long presents for  Chief Complaint  Patient presents with   Medical Management of Chronic Issues    Follow up on blood pressure   Presents with elevated blood pressure accompanied by headache and dizziness since Feb 23 .    Started the day after taking a  new supplement for congestion  called Vitaae by Sane MD Has been taking extra doses of amlodipine.    HTN: taking amlodipine 2.5 mg daily and metoprolol 25 mg daily  home readings  have been as high as 214/108 on feb 24      Outpatient Medications Prior to Visit  Medication Sig Dispense Refill   ALPRAZolam (XANAX) 0.25 MG tablet TAKE ONE TABLET BY MOUTH AT BEDTIME 30 tablet 5   amLODipine (NORVASC) 2.5 MG tablet TAKE ONE TABLET (2.5 MG) BY MOUTH EVERY DAY 90 tablet 1   apixaban (ELIQUIS) 5 MG TABS tablet Take 1 tablet (5 mg total) by mouth 2 (two) times daily. 180 tablet 3   benzonatate (TESSALON) 100 MG capsule Take 2 capsules (200 mg total) by mouth 3 (three) times daily as needed for cough. 30 capsule 0   Cholecalciferol 125 MCG (5000 UT) TABS Take 5,000 Units by mouth in the morning.     citalopram (CELEXA) 20 MG tablet TAKE 1 TABLET BY MOUTH DAILY 90 tablet 1   clobetasol (TEMOVATE) 0.05 % external solution APPLY DAILY UNTIL RASH IS CLEAR (Patient taking differently: Apply 1 Application topically daily as needed (Eczema).) 50 mL 0   Coenzyme Q10 100 MG capsule Take 100 mg by mouth daily.     Famotidine (ACID CONTROLLER PO) Take by mouth daily.     metoprolol succinate (TOPROL-XL) 25 MG 24 hr tablet Take 1 tablet (25 mg total) by mouth daily. Take with or immediately following a meal. 90 tablet 3   ondansetron  (ZOFRAN) 4 MG tablet TAKE 1 TABLET BY MOUTH EVERY 8 HOURS AS NEEDED FOR NAUSEA OR VOMITING 20 tablet 0   rosuvastatin (CRESTOR) 10 MG tablet Take 1 tablet (10 mg total) by mouth daily. 90 tablet 1   tirzepatide (ZEPBOUND) 7.5 MG/0.5ML Pen Inject 7.5 mg into the skin once a week. 6 mL 2   No facility-administered medications prior to visit.    Review of Systems;  Patient denies headache, fevers, malaise, unintentional weight loss, skin rash, eye pain, sinus congestion and sinus pain, sore throat, dysphagia,  hemoptysis , cough, dyspnea, wheezing, chest pain, palpitations, orthopnea, edema, abdominal pain, nausea, melena, diarrhea, constipation, flank pain, dysuria, hematuria, urinary  Frequency, nocturia, numbness, tingling, seizures,  Focal weakness, Loss of consciousness,  Tremor, insomnia, depression, anxiety, and suicidal ideation.      Objective:  BP 119/86   Pulse (!) 59   Ht 5\' 2"  (1.575 m)   Wt 149 lb (67.6 kg)   SpO2 95%   BMI 27.25 kg/m   BP Readings from Last 3 Encounters:  07/27/23 119/86  06/15/23 126/78  02/14/23 128/76    Wt Readings from Last 3 Encounters:  07/27/23 149 lb (67.6 kg)  06/15/23 141 lb (64 kg)  02/14/23 143 lb (64.9 kg)    Physical Exam Vitals reviewed.  Constitutional:  General: She is not in acute distress.    Appearance: Normal appearance. She is normal weight. She is not ill-appearing, toxic-appearing or diaphoretic.  HENT:     Head: Normocephalic.  Eyes:     General: No scleral icterus.       Right eye: No discharge.        Left eye: No discharge.     Conjunctiva/sclera: Conjunctivae normal.  Cardiovascular:     Rate and Rhythm: Normal rate and regular rhythm.     Heart sounds: Normal heart sounds.  Pulmonary:     Effort: Pulmonary effort is normal. No respiratory distress.     Breath sounds: Normal breath sounds.  Musculoskeletal:        General: Normal range of motion.  Skin:    General: Skin is warm and dry.   Neurological:     General: No focal deficit present.     Mental Status: She is alert and oriented to person, place, and time. Mental status is at baseline.  Psychiatric:        Mood and Affect: Mood normal.        Behavior: Behavior normal.        Thought Content: Thought content normal.        Judgment: Judgment normal.    Lab Results  Component Value Date   HGBA1C 5.4 10/09/2021   HGBA1C 5.0 03/10/2021   HGBA1C 5.8 02/28/2019    Lab Results  Component Value Date   CREATININE 1.19 (H) 09/09/2022   CREATININE 1.11 (H) 08/19/2022   CREATININE 1.11 08/13/2022    Lab Results  Component Value Date   WBC 5.5 08/19/2022   HGB 13.4 08/19/2022   HCT 40.7 08/19/2022   PLT 198 08/19/2022   GLUCOSE 80 09/09/2022   CHOL 178 08/13/2022   TRIG 130.0 08/13/2022   HDL 47.00 08/13/2022   LDLDIRECT 100.0 08/13/2022   LDLCALC 105 (H) 08/13/2022   ALT 26 08/13/2022   AST 19 08/13/2022   NA 142 09/09/2022   K 3.6 09/09/2022   CL 109 09/09/2022   CREATININE 1.19 (H) 09/09/2022   BUN 18 09/09/2022   CO2 25 09/09/2022   TSH 1.25 05/26/2022   INR 0.91 10/23/2009   HGBA1C 5.4 10/09/2021   MICROALBUR 1.3 10/09/2021    No results found.  Assessment & Plan:  .Essential hypertension Assessment & Plan: Recent elevations coincided with use of a "natural " supplement for congestion which she started taking one day prior to elevations.  Since she stopped the supplement the readings at home have trended down.  Advised to avoid all OTC decongestants and resume amlodipine 2.5 mg /metoprolol 25 mg daily .  Checking urine for protein today   Orders: -     Microalbumin / creatinine urine ratio -     Comprehensive metabolic panel  Overweight (BMI 25.0-29.9) -     TSH -     CBC with Differential/Platelet  Hyperlipidemia LDL goal <100 -     LDL cholesterol, direct -     Lipid panel  Need for influenza vaccination -     Flu Vaccine Trivalent High Dose (Fluad)     Follow-up: No  follow-ups on file.   Sherlene Shams, MD

## 2023-07-28 LAB — CBC WITH DIFFERENTIAL/PLATELET
Basophils Absolute: 0.1 10*3/uL (ref 0.0–0.1)
Basophils Relative: 1 % (ref 0.0–3.0)
Eosinophils Absolute: 0.2 10*3/uL (ref 0.0–0.7)
Eosinophils Relative: 2 % (ref 0.0–5.0)
HCT: 40.1 % (ref 36.0–46.0)
Hemoglobin: 13.3 g/dL (ref 12.0–15.0)
Lymphocytes Relative: 16.9 % (ref 12.0–46.0)
Lymphs Abs: 1.6 10*3/uL (ref 0.7–4.0)
MCHC: 33.2 g/dL (ref 30.0–36.0)
MCV: 97.6 fl (ref 78.0–100.0)
Monocytes Absolute: 1 10*3/uL (ref 0.1–1.0)
Monocytes Relative: 10.5 % (ref 3.0–12.0)
Neutro Abs: 6.6 10*3/uL (ref 1.4–7.7)
Neutrophils Relative %: 69.6 % (ref 43.0–77.0)
Platelets: 315 10*3/uL (ref 150.0–400.0)
RBC: 4.11 Mil/uL (ref 3.87–5.11)
RDW: 14.4 % (ref 11.5–15.5)
WBC: 9.5 10*3/uL (ref 4.0–10.5)

## 2023-07-28 LAB — COMPREHENSIVE METABOLIC PANEL
ALT: 22 U/L (ref 0–35)
AST: 19 U/L (ref 0–37)
Albumin: 4 g/dL (ref 3.5–5.2)
Alkaline Phosphatase: 79 U/L (ref 39–117)
BUN: 17 mg/dL (ref 6–23)
CO2: 29 meq/L (ref 19–32)
Calcium: 8.9 mg/dL (ref 8.4–10.5)
Chloride: 103 meq/L (ref 96–112)
Creatinine, Ser: 1.16 mg/dL (ref 0.40–1.20)
GFR: 46.26 mL/min — ABNORMAL LOW (ref >60.00)
Glucose, Bld: 77 mg/dL (ref 70–99)
Potassium: 4 meq/L (ref 3.5–5.1)
Sodium: 140 meq/L (ref 135–145)
Total Bilirubin: 0.9 mg/dL (ref 0.2–1.2)
Total Protein: 6.2 g/dL (ref 6.0–8.3)

## 2023-07-28 LAB — MICROALBUMIN / CREATININE URINE RATIO
Creatinine,U: 126 mg/dL
Microalb Creat Ratio: 7.7 mg/g (ref 0.0–30.0)
Microalb, Ur: 1 mg/dL (ref 0.0–1.9)

## 2023-07-28 LAB — LIPID PANEL
Cholesterol: 168 mg/dL (ref 0–200)
HDL: 48.7 mg/dL (ref >39.00)
LDL Cholesterol: 84 mg/dL (ref 0–99)
NonHDL: 118.96
Total CHOL/HDL Ratio: 3
Triglycerides: 173 mg/dL — ABNORMAL HIGH (ref 0.0–149.0)
VLDL: 34.6 mg/dL (ref 0.0–40.0)

## 2023-07-28 LAB — LDL CHOLESTEROL, DIRECT: Direct LDL: 95 mg/dL

## 2023-07-28 LAB — TSH: TSH: 0.84 u[IU]/mL (ref 0.35–5.50)

## 2023-07-28 MED ORDER — ROSUVASTATIN CALCIUM 10 MG PO TABS: 10.0000 mg | ORAL_TABLET | Freq: Every day | ORAL | 1 refills | Status: DC

## 2023-07-28 MED ORDER — CITALOPRAM HYDROBROMIDE 20 MG PO TABS: ORAL_TABLET | ORAL | 1 refills | Status: DC

## 2023-07-28 MED ORDER — ZEPBOUND 7.5 MG/0.5ML ~~LOC~~ SOAJ
7.5000 mg | SUBCUTANEOUS | 2 refills | Status: DC
Start: 2023-07-28 — End: 2023-09-07

## 2023-07-28 MED ORDER — AMLODIPINE BESYLATE 2.5 MG PO TABS
ORAL_TABLET | ORAL | 1 refills | Status: DC
Start: 2023-07-28 — End: 2023-08-09

## 2023-07-30 ENCOUNTER — Encounter: Payer: Self-pay | Admitting: Internal Medicine

## 2023-08-04 ENCOUNTER — Ambulatory Visit: Payer: Self-pay | Admitting: Internal Medicine

## 2023-08-04 ENCOUNTER — Ambulatory Visit: Admitting: Physician Assistant

## 2023-08-04 VITALS — BP 122/88 | HR 73 | Temp 98.1°F | Resp 16 | Ht 62.0 in | Wt 148.9 lb

## 2023-08-04 DIAGNOSIS — K529 Noninfective gastroenteritis and colitis, unspecified: Secondary | ICD-10-CM

## 2023-08-04 DIAGNOSIS — R0981 Nasal congestion: Secondary | ICD-10-CM | POA: Diagnosis not present

## 2023-08-04 DIAGNOSIS — F411 Generalized anxiety disorder: Secondary | ICD-10-CM | POA: Diagnosis not present

## 2023-08-04 DIAGNOSIS — I10 Essential (primary) hypertension: Secondary | ICD-10-CM

## 2023-08-04 NOTE — Progress Notes (Signed)
 Established patient visit  Patient: Kathy Long   DOB: 05-23-1951   73 y.o. Female  MRN: [id number] Visit Date: 08/04/2023  Today's healthcare provider: Debera Lat, PA-C   Chief Complaint  Patient presents with   Hypertension    HTN concerns.    Subjective     Discussed the use of AI scribe software for clinical note transcription with the patient, who gave verbal consent to proceed.  History of Present Illness   The patient, with a history of depression and anxiety, presents with high blood pressure. She reports that her blood pressure increased after taking over-the-counter cough medication for phlegm. The patient's blood pressure readings have been inconsistent, with some readings being normal and others being high. The patient also reports a headache, which she attributes to Zepbound, a medication she takes for weight loss.  In addition to her primary complaint, the patient also reports ongoing postnasal drip and recent diarrhea after eating at a [employer]. She also expresses significant anxiety related to current political events, which has been causing her to lose sleep. The patient is currently seeing a counselor to help manage her anxiety.        08/04/2023    2:30 PM 02/14/2023    1:24 PM 08/13/2022    1:07 PM  Depression screen PHQ 2/9  Decreased Interest 0 0 0  Down, Depressed, Hopeless 0 0 0  PHQ - 2 Score 0 0 0  Altered sleeping 0 1 2  Tired, decreased energy 1 0 0  Change in appetite 0 0 0  Feeling bad or failure about yourself  0 0 0  Trouble concentrating 0 0 0  Moving slowly or fidgety/restless 0 0 0  Suicidal thoughts 0 0 0  PHQ-9 Score 1 1 2   Difficult doing work/chores Not difficult at all Not difficult at all Somewhat difficult      08/04/2023    2:30 PM 02/14/2023    1:24 PM 08/13/2022    1:07 PM  GAD 7 : Generalized Anxiety Score  Nervous, Anxious, on Edge 1 0 1  Control/stop worrying 1 0 0  Worry too much - different things 1 0 1   Trouble relaxing 0 0 1  Restless 0 0 0  Easily annoyed or irritable 0 0 0  Afraid - awful might happen 1 0 0  Total GAD 7 Score 4 0 3  Anxiety Difficulty Not difficult at all Not difficult at all Not difficult at all    Medications: Outpatient Medications Prior to Visit  Medication Sig   ALPRAZolam (XANAX) 0.25 MG tablet TAKE ONE TABLET BY MOUTH AT BEDTIME   amLODipine (NORVASC) 2.5 MG tablet TAKE ONE TABLET (2.5 MG) BY MOUTH EVERY DAY   apixaban (ELIQUIS) 5 MG TABS tablet Take 1 tablet (5 mg total) by mouth 2 (two) times daily.   benzonatate (TESSALON) 100 MG capsule Take 2 capsules (200 mg total) by mouth 3 (three) times daily as needed for cough.   Cholecalciferol 125 MCG (5000 UT) TABS Take 5,000 Units by mouth in the morning.   citalopram (CELEXA) 20 MG tablet TAKE 1 TABLET BY MOUTH DAILY   clobetasol (TEMOVATE) 0.05 % external solution APPLY DAILY UNTIL RASH IS CLEAR (Patient taking differently: Apply 1 Application topically daily as needed (Eczema).)   Coenzyme Q10 100 MG capsule Take 100 mg by mouth daily.   Famotidine (ACID CONTROLLER PO) Take by mouth daily.   metoprolol succinate (TOPROL-XL) 25 MG 24 hr tablet Take 1 tablet (  25 mg total) by mouth daily. Take with or immediately following a meal.   ondansetron (ZOFRAN) 4 MG tablet TAKE 1 TABLET BY MOUTH EVERY 8 HOURS AS NEEDED FOR NAUSEA OR VOMITING   rosuvastatin (CRESTOR) 10 MG tablet Take 1 tablet (10 mg total) by mouth daily.   tirzepatide (ZEPBOUND) 7.5 MG/0.5ML Pen Inject 7.5 mg into the skin once a week.   No facility-administered medications prior to visit.    Review of Systems All negative Except see HPI       Objective    BP 122/88 (BP Location: Left Arm, Patient Position: Sitting, Cuff Size: Normal)   Pulse 73   Temp 98.1 F (36.7 C) (Oral)   Resp 16   Ht 5\' 2"  (1.575 m)   Wt 148 lb 14.4 oz (67.5 kg)   SpO2 98%   BMI 27.23 kg/m     Physical Exam Vitals reviewed.  Constitutional:      General:  She is not in acute distress.    Appearance: Normal appearance. She is well-developed. She is not diaphoretic.  HENT:     Head: Normocephalic and atraumatic.  Eyes:     General: No scleral icterus.    Conjunctiva/sclera: Conjunctivae normal.  Neck:     Thyroid: No thyromegaly.  Cardiovascular:     Rate and Rhythm: Normal rate and regular rhythm.     Pulses: Normal pulses.     Heart sounds: Normal heart sounds. No murmur heard. Pulmonary:     Effort: Pulmonary effort is normal. No respiratory distress.     Breath sounds: Normal breath sounds. No wheezing, rhonchi or rales.  Musculoskeletal:     Cervical back: Neck supple.     Right lower leg: No edema.     Left lower leg: No edema.  Lymphadenopathy:     Cervical: No cervical adenopathy.  Skin:    General: Skin is warm and dry.     Findings: No rash.  Neurological:     Mental Status: She is alert and oriented to person, place, and time. Mental status is at baseline.  Psychiatric:        Mood and Affect: Mood normal.        Behavior: Behavior normal.      No results found for any visits on 08/04/23.      Assessment and Plan    Hypertension Chronic , unstable Recent increase in blood pressure due to over-the-counter cough medication. Currently managed with Norvasc 2.5mg  daily and Metoprolol 25mg  daily. -Discontinue over-the-counter cough medication. -Continue Norvasc and Metoprolol at current doses. -Monitor blood pressure at home, morning and evening, and record results along with heart rate. Continue low-sodium diet, exercise as tolerated Will follow-up with her primary in 2 - 4 weeks.  Sinus Congestion Chronic postnasal drip and recent sinus congestion. -Discontinue Afrin due to potential blood pressure effects. -Start Flonase as needed for flare-ups. -Use nasal saline spray regularly for maintenance.  Anxiety Chronic, worsening Increased anxiety related to current political climate, causing sleep disturbances  and increased stress. Currently in counseling and taking Citalopram for long-standing depression. -Continue counseling sessions, mildly decreased frequency of sessions -Consider meditation, deep breathing exercises, and other stress management techniques. -Continue Citalopram 20 Mg at current dose.  Gastroenteritis Acute, improving Recent episode of diarrhea after eating at a [employer]. -Drink plenty of water and avoid spicy foods.  Follow-up Schedule appointment with primary care provider in a few weeks to reassess blood pressure and overall health.  No orders of the defined types were placed in this encounter.   No follow-ups on file.   The patient was advised to call back or seek an in-person evaluation if the symptoms worsen or if the condition fails to improve as anticipated.  I discussed the assessment and treatment plan with the patient. The patient was provided an opportunity to ask questions and all were answered. The patient agreed with the plan and demonstrated an understanding of the instructions.  I, Debera Lat, PA-C have reviewed all documentation for this visit. The documentation on 08/04/2023  for the exam, diagnosis, procedures, and orders are all accurate and complete.  Debera Lat, [facility], MMS [facility] [phone] (phone) [phone] (fax)  [facility]

## 2023-08-04 NOTE — Telephone Encounter (Signed)
 Copied from CRM [id number]. Topic: Clinical - Red Word Triage >> Aug 04, 2023 10:40 AM [place] J wrote: [facility] that prompted transfer to Nurse Triage:  LEFT ARM BP  READING: 167/95 RIGHT ARM BP READING: 157/117  Chief Complaint: high blood pressure Symptoms: BP 161/105 HR 66, 108/91 HR 65, 173/103  Frequency: yesterday Pertinent Negatives: Patient denies headache 07/10 Disposition: [] ED /[] Urgent Care (no appt availability in office) / [x] Appointment(In office/virtual)/ []  [facility]/ [] Home Care/ [] Refused Recommended Disposition /[] [facility]/ []  Follow-up with PCP Additional Notes: stopped online medication on Wednesday 07/27/2023 per PCP because it could be contributing to the HTN.  Headache-forehead and nose area. Pt is currently taking normal regimen of BP medications   Reason for Disposition  [1] Systolic BP  >= 130 OR Diastolic >= 80 AND [2] taking BP medications  Additional Information  Commented on: Systolic BP  >= 180 OR Diastolic >= 110    BP 173/103 and c/o headache: schedule pt to be seen today with a provider at a different facility  Answer Assessment - Initial Assessment Questions 1. BLOOD PRESSURE: "What is the blood pressure?" "Did you take at least two measurements 5 minutes apart?"     161/105 HR 66 2. ONSET: "When did you take your blood pressure?"     This morning 3. HOW: "How did you take your blood pressure?" (e.g., automatic home BP monitor, visiting nurse)     Automatic  4. HISTORY: "Do you have a history of high blood pressure?"     Yes hx of  5. MEDICINES: "Are you taking any medicines for blood pressure?" "Have you missed any doses recently?"     No  6. OTHER SYMPTOMS: "Do you have any symptoms?" (e.g., blurred vision, chest pain, difficulty breathing, headache, weakness)     headache 7. PREGNANCY: "Is there any chance you are pregnant?" "When was your last menstrual period?"     N/a  Protocols used: Blood Pressure - High-A-AH

## 2023-08-05 ENCOUNTER — Encounter: Payer: Self-pay | Admitting: Internal Medicine

## 2023-08-05 DIAGNOSIS — I10 Essential (primary) hypertension: Secondary | ICD-10-CM

## 2023-08-06 ENCOUNTER — Encounter: Payer: Self-pay | Admitting: Physician Assistant

## 2023-08-08 ENCOUNTER — Other Ambulatory Visit: Payer: Self-pay | Admitting: Internal Medicine

## 2023-08-09 ENCOUNTER — Ambulatory Visit: Payer: Medicare PPO | Admitting: Psychiatry

## 2023-08-09 MED ORDER — AMLODIPINE BESYLATE 5 MG PO TABS: 5.0000 mg | ORAL_TABLET | Freq: Every day | ORAL | 1 refills | Status: DC

## 2023-08-09 NOTE — Assessment & Plan Note (Signed)
 Advised to increas amlodipine dose to 5 mg for persistent diastolic elevation  and continue 12.5 mg metoprolol (mychart)

## 2023-08-23 ENCOUNTER — Ambulatory Visit: Payer: Medicare PPO | Admitting: Psychiatry

## 2023-08-23 DIAGNOSIS — F411 Generalized anxiety disorder: Secondary | ICD-10-CM | POA: Diagnosis not present

## 2023-08-23 NOTE — Progress Notes (Signed)
 [facility] Counselor/Therapist Progress Note  Patient ID: HEIDEE AUDI, MRN: [id number],    Date: 08/23/2023  Time Spent: 55 minutes   Treatment Type: Individual Therapy  Reported Symptoms: anxiety, stressed, frustration, some sadness but improving,  anger improving, no SI    Mental Status Exam:  Appearance:   Casual and Well Groomed     Behavior:  Appropriate, Sharing, and Motivated  Motor:  Normal  Speech/Language:   Clear and Coherent  Affect:  Depressed and anxiou  Mood:  anxious and depressed  Thought process:  goal directed  Thought content:    WNL  Sensory/Perceptual disturbances:    WNL  Orientation:  oriented to person, place, time/date, situation, day of week, month of year, year, and stated date of August 23, 2023  Attention:  Good  Concentration:  Good  Memory:  WNL  Fund of knowledge:   Good  Insight:    Good  Judgment:   Good  Impulse Control:  Good   Risk Assessment: Danger to Self:  No Self-injurious Behavior: No Danger to Others: No Duty to Warn:no Physical Aggression / Violence:No  Access to Firearms a concern: No  Gang Involvement:No   Subjective:    Patient in session today continuing to work on her anxiety, depression,anger, stress, and "occasional bouts of sadness".  Feels that more involvement with other people might help and she is seeking out opportunities that connect with her interests. Recently got involved with a group of people affiliated with her political party and trying to support people in need. Worries a lot about our country, people who are struggling particularly for basic needs. Wanted to share more today on her strong anger and how to manage it. Patient shared openly about her struggle with her strong anger and discussed some ways of managing her anger and frustration that can help her but not necessarily alienate others, and talked about this in more detail.  Therapist questions some of the roots of her anger and patient  responded well.  Is to focus on reducing the amount of time she spends focusing on political dissatisfaction, wants to pray more for peace and understanding, move forward getting involved in some activities that will help her help herself and feel more empowered and helpful ways. Very talkative and easy to go off on subjects, and responds well to redirection from therapist.  Is easily influenced at times by others and working on this.  Also working to focus better on particular issues that she is needing to work on and not let herself get distracted in other directions.  Shared part of her time today processing some feelings regarding a close friend's daughter who committed suicide recently.  ** Discussed more today as a follow-up from last session about her need and desire to manage stress and anxiety better and as a result gets better sleep.  To pick up on this again next session as we ran out of time today.  Interventions: Cognitive Behavioral Therapy and Ego-Supportive 1.Reduce overall level, frequency, and intensity of the anxiety so that daily functioning is not impaired. 2.Increase understanding of beliefs and messages that produce the worry and anxiety. 3.Explore cognitive messages that mediate anxiety response and retrain in adaptive cognitions.   Diagnosis:   ICD-10-CM   1. Generalized anxiety disorder  F41.1      Plan:   Patient in session and working today further on her anxiety, "occasional" bouts of sadness, and stress.  Also continued some work regarding  grief of a close friend's daughter who committed suicide recently.  Patient is showing good motivation and active participation in sessions.  Is open to looking at things from a different point of view and to following through with work on her treatment goals.  She is showing motivation thus far, works well in session and does respond well to some occasional non-- pressured redirection as she wants to follow through and sticking with her  goals and working on them and not go off in other directions that are not related.  (Not all details included in this note due to patient privacy needs.).  Continues to have good motivation in sessions and a positive outlook even on the things she is wanting to change about herself.  Patient has shown progress thus far and needs to continue working with her goal-directed behaviors in order to move in a forward and more positive direction.  Goal review and progress/challenges noted with patient.  Next appointment within 2 weeks.   Mathis Fare, LCSW

## 2023-09-07 ENCOUNTER — Other Ambulatory Visit: Payer: Self-pay | Admitting: Internal Medicine

## 2023-09-08 ENCOUNTER — Other Ambulatory Visit: Payer: Self-pay | Admitting: Internal Medicine

## 2023-09-12 ENCOUNTER — Ambulatory Visit: Admitting: Psychiatry

## 2023-09-12 DIAGNOSIS — F411 Generalized anxiety disorder: Secondary | ICD-10-CM

## 2023-09-12 NOTE — Progress Notes (Signed)
 [facility] Counselor/Therapist Progress Note  Patient ID: Kathy Long, MRN: [id number],    Date: 09/12/2023  Time Spent: 53 minutes  Treatment Type: Individual Therapy  Reported Symptoms:  anxiety, stressed, frustration, some sadness, anger, no SI   Mental Status Exam:  Appearance:   Casual and Neat     Behavior:  Appropriate, Sharing, and Motivated  Motor:  Normal  Speech/Language:   Clear and Coherent  Affect:  Depressed and anxious  Mood:  anxious and depressed  Thought process:  goal directed  Thought content:    WNL  Sensory/Perceptual disturbances:    WNL  Orientation:  oriented to person, place, time/date, situation, day of week, month of year, year, and stated date of September 12, 2023  Attention:  Good  Concentration:  Good  Memory:  WNL  Fund of knowledge:   Good  Insight:    Good  Judgment:   Good  Impulse Control:  Good   Risk Assessment: Danger to Self:  No Self-injurious Behavior: No Danger to Others: No Duty to Warn:no Physical Aggression / Violence:No  Access to Firearms a concern: No  Gang Involvement:No   Subjective:   Patient today reporting symptoms of anxiety, anger, depression, stress, and some sadness regarding the suicide of a close friend's adult daughter recently. Very concerned and upset over political environment and so many "bad things happening in our country and the world." Needed session today to vent her concerns, anger, frustration, and "hurts" and did so very openly. Frustrated at her church and in other areas where she feels others are not taking a stand against political issues, and processed this so "I can focus on my therapy homework more today". Brought in copy of some journaling she did between sessions. Shared her written therapy homework and processed in session, including how it relates to her anger, frustration, other concerns and her being able to grow and heal in a more positive/emotionally healthy direction. Leading a  small group within her church and having some challenges that she needed to process briefly in session today.  Following up from previous session, she is continuing to seek out opportunities that help connect her with other people and similar interests.  Very sensitive about the needs of other people particularly those who are struggling for basic needs or who feel pushed aside by initiatives/priorities of current presidential office.  Some old anger and we are continuing to explore it.  Has been very concerned about the situation where her friend's daughter committed suicide and shared today more about how this impacted patient.  Interventions: Cognitive Behavioral Therapy and Ego-Supportive 1.Reduce overall level, frequency, and intensity of the anxiety so that daily functioning is not impaired. 2.Increase understanding of beliefs and messages that produce the worry and anxiety. 3.Explore cognitive messages that mediate anxiety response and retrain in adaptive cognitions.   Diagnosis:   ICD-10-CM   1. Generalized anxiety disorder  F41.1      Plan:  Patient today in session reporting anxiety, stress, and some occasional "bouts of sadness".  States that she has continued to work on some grief issues regarding the suicide of a close friend's adult daughter recently.  Continues to show good motivation and very active participation in sessions.  Very concerned about current presidential administration and how certain initiatives are hurtful populations in our country very much in need.  Her faith is very strong and values relationships and programs within her church.  (Not all details included in this  note due to patient privacy needs.) Patient today working well on her treatment goals and reporting some sadness and stress.  Continues to work on grief issues related to the suicide of a friend's adult daughter.  Good motivation and good participation inside sessions and outside of sessions.  Definitely able  to look at some things in her life from a different point of view and remaining committed to her treatment goals.  Does have a tendency to "go off in different directions" per her report and have experiences some in sessions, offering her some support and following and sticking with her goals more closely as she works on them and does not go off "on tangents" that are not really related to treatment goals.  She has had a lot going on in life and attributes that to her bringing up things that are unrelated at times, however I encouraged her not to be judgmental of herself and knows that maybe there is a reason why some of those things surface in sessions.  (Not all details included in this note due to patient privacy needs).  Keymiah Lyles does show progress and motivation in working on her goal-directed behaviors as she works to move in a more forward and positive direction.  Goal review and progress/challenges noted with patient.  Next appointment within 2 to 3 weeks.   Kelleen Patee, LCSW

## 2023-09-28 ENCOUNTER — Ambulatory Visit: Admitting: Psychiatry

## 2023-09-28 DIAGNOSIS — F411 Generalized anxiety disorder: Secondary | ICD-10-CM

## 2023-09-28 NOTE — Progress Notes (Signed)
 [facility] Counselor/Therapist Progress Note  Patient ID: Kathy Long, MRN: [id number],    Date: 09/28/2023  Time Spent: 53 minutes   Treatment Type: Individual Therapy  Reported Symptoms: anxiety, anger, frustration, needing to set limits with certain political involvement as this impacts her anxiety and stress level   Mental Status Exam:  Appearance:   Neat and Well Groomed     Behavior:  Appropriate, Sharing, and Motivated  Motor:  Normal  Speech/Language:   Clear and Coherent  Affect:  anxious  Mood:  anxious and stressed, frustrated  Thought process:  goal directed  Thought content:    WNL  Sensory/Perceptual disturbances:    WNL  Orientation:  oriented to person, place, time/date, situation, day of week, month of year, year, and stated date of September 28, 2023  Attention:  Good  Concentration:  Good  Memory:  WNL  Fund of knowledge:   Good  Insight:    Good  Judgment:   Good  Impulse Control:  Good   Risk Assessment: Danger to Self:  No Self-injurious Behavior: No Danger to Others: No Duty to Warn:no Physical Aggression / Violence:No  Access to Firearms a concern: No  Gang Involvement:No   Subjective:  Patient today states she 's had more anger and frustration, anxiety, but less depression. Enjoys some contact with  people who are wanting more positive changes politically and patient participating as she feels she can, however based on recent involvement she is needing to decrease the time she is involved in political affairs as it is negatively impacting her anxiety level. Frustrated in some relationships when others are not as committed as patient is. Talking freely today through a lot of her stress/frustration, some of which came about due to some of her political involvement where she definitely means well but is more recently getting overly-stressed with her participation within the political scene.  Really working today on trying to better manage anger,  anxiety, and some depression often related to some of the political involvement she has been a part of recently.  Discussed with patient her need to set very clear boundaries with the political involvement as it noticeably impacts her mood and temperament and negative ways when her involvement has been heightened.  When she keeps it to an more moderate level, she seems more leveled out and her mood is not negatively impacted.  Discussed this in more detail today with patient during session and she seemed to understand and also wants to curb some of her involvement and political action groups.  She to had noticed that it was impacting her mental/emotional health and appreciated the time today for us  to talk through this and how she can still be involved in certain ways but not be so impacted emotionally.  Interventions: Cognitive Behavioral Therapy and Ego-Supportive 1.Reduce overall level, frequency, and intensity of the anxiety so that daily functioning is not impaired. 2.Increase understanding of beliefs and messages that produce the worry and anxiety. 3.Explore cognitive messages that mediate anxiety response and retrain in adaptive cognitions.   Diagnosis:   ICD-10-CM   1. Generalized anxiety disorder  F41.1      Plan: Patient working in session to work on some recent heightened mood issues and her stress and anxiety and some sadness.  Realizing that some of it seems to been very much impacted by recent increase in political groups.  Patient agrees with this and sees the need for her to reduce some of her  involvement in the political activities which she is going to take care of this week.  Is rescheduling for next appointment within a week.  Good motivation and realizing that she just needs to channel that in a good direction as far as different activities to be involved in.  Has a heart for other people that struggle and how political action can impact that in a positive or negative direction and  has been concerned about a lot of negatives recently.  Is very goal-directed today and plans to follow through on cutting back her involvement as noted above and feels that will definitely impact her mood in a more positive way.  Patient working well on her treatment goals and trying to stay more focused.  (Not all details included in this note due to patient privacy needs).  This patient, Kathy Long, shows progress and motivation and working on her goal-directed behaviors as she desires to move in a more forward and positive direction into the future.  Goal review and progress/challenges noted with patient.   Next appt within 2-3 wks.   Kelleen Patee, LCSW

## 2023-10-13 ENCOUNTER — Ambulatory Visit: Admitting: Psychiatry

## 2023-10-18 ENCOUNTER — Ambulatory Visit: Admitting: Psychiatry

## 2023-10-18 ENCOUNTER — Emergency Department

## 2023-10-18 ENCOUNTER — Other Ambulatory Visit: Payer: Self-pay | Admitting: Cardiovascular Disease

## 2023-10-18 ENCOUNTER — Other Ambulatory Visit: Payer: Self-pay

## 2023-10-18 ENCOUNTER — Ambulatory Visit: Payer: Self-pay | Admitting: *Deleted

## 2023-10-18 ENCOUNTER — Emergency Department
Admission: EM | Admit: 2023-10-18 | Discharge: 2023-10-18 | Disposition: A | Discharge status: Home / Self Care | Attending: Emergency Medicine | Admitting: Emergency Medicine

## 2023-10-18 DIAGNOSIS — I6782 Cerebral ischemia: Secondary | ICD-10-CM | POA: Diagnosis not present

## 2023-10-18 DIAGNOSIS — I10 Essential (primary) hypertension: Secondary | ICD-10-CM | POA: Diagnosis not present

## 2023-10-18 DIAGNOSIS — Z79899 Other long term (current) drug therapy: Secondary | ICD-10-CM | POA: Insufficient documentation

## 2023-10-18 DIAGNOSIS — R519 Headache, unspecified: Secondary | ICD-10-CM | POA: Diagnosis present

## 2023-10-18 DIAGNOSIS — Z7901 Long term (current) use of anticoagulants: Secondary | ICD-10-CM | POA: Insufficient documentation

## 2023-10-18 LAB — COMPREHENSIVE METABOLIC PANEL WITH GFR
ALT: 35 U/L (ref 0–44)
AST: 29 U/L (ref 15–41)
Albumin: 4.2 g/dL (ref 3.5–5.0)
Alkaline Phosphatase: 68 U/L (ref 38–126)
Anion gap: 8 (ref 5–15)
BUN: 18 mg/dL (ref 8–23)
CO2: 27 mmol/L (ref 22–32)
Calcium: 9.3 mg/dL (ref 8.9–10.3)
Chloride: 105 mmol/L (ref 98–111)
Creatinine, Ser: 0.96 mg/dL (ref 0.44–1.00)
GFR, Estimated: >60 mL/min (ref >60)
Glucose, Bld: 92 mg/dL (ref 70–99)
Potassium: 4.3 mmol/L (ref 3.5–5.1)
Sodium: 140 mmol/L (ref 135–145)
Total Bilirubin: 1.4 mg/dL — ABNORMAL HIGH (ref 0.0–1.2)
Total Protein: 7 g/dL (ref 6.5–8.1)

## 2023-10-18 LAB — CBC WITH DIFFERENTIAL/PLATELET
Abs Immature Granulocytes: 0.01 10*3/uL (ref 0.00–0.07)
Basophils Absolute: 0.1 10*3/uL (ref 0.0–0.1)
Basophils Relative: 2 %
Eosinophils Absolute: 0.4 10*3/uL (ref 0.0–0.5)
Eosinophils Relative: 8 %
HCT: 41.8 % (ref 36.0–46.0)
Hemoglobin: 13.7 g/dL (ref 12.0–15.0)
Immature Granulocytes: 0 %
Lymphocytes Relative: 30 %
Lymphs Abs: 1.6 10*3/uL (ref 0.7–4.0)
MCH: 31.6 pg (ref 26.0–34.0)
MCHC: 32.8 g/dL (ref 30.0–36.0)
MCV: 96.5 fL (ref 80.0–100.0)
Monocytes Absolute: 0.5 10*3/uL (ref 0.1–1.0)
Monocytes Relative: 9 %
Neutro Abs: 2.7 10*3/uL (ref 1.7–7.7)
Neutrophils Relative %: 51 %
Platelets: 224 10*3/uL (ref 150–400)
RBC: 4.33 MIL/uL (ref 3.87–5.11)
RDW: 12 % (ref 11.5–15.5)
WBC: 5.2 10*3/uL (ref 4.0–10.5)
nRBC: 0 % (ref 0.0–0.2)

## 2023-10-18 LAB — TROPONIN I (HIGH SENSITIVITY): Troponin I (High Sensitivity): 3 ng/L (ref <18)

## 2023-10-18 NOTE — Telephone Encounter (Signed)
 Spoke with pt and advised her that with the symptoms she is having and the elevated blood pressure she needs to be seen in the ED. Pt gave a verbal understanding and stated that she will go to the ED.

## 2023-10-18 NOTE — ED Provider Notes (Addendum)
 [facility] Provider Note    Event Date/Time   First MD Initiated Contact with Patient 10/18/23 1610     (approximate)   History   Hypertension   HPI  Kathy Long is a 73 y.o. female who comes in with hypertension, A-fib on Eliquis  and metoprolol  that she noted this morning.  There she is also concerned about having a headache.  She reports always having headaches 2 days after her zepbound  treatment.  Patient only takes amlodipine  and metoprolol  for her blood pressure currently.  Patient reports that she woke up this morning had a little bit of dizziness with mild headache.  She denies being the worst headache of her life or other headache waking her up from sleep or being sudden and severe in onset.  She reports that she actually gets worse headaches after she takes her zepbound .  She did report taking it a few days ago.  She reports that she called her doctor who told her to come in and get evaluated due to an elevated blood pressure at home.  She went to urgent care just to confirm the blood pressure and given it was elevated they sent her to the emergency room to be evaluated.  At this time she reports feeling at her normal self.  She does report that recently she is been under a lot of stress with the current clinical situation.  To the point where she is actually had to join therapy.  She wonders if this could be making her blood pressure go up.  At this time she denies any headaches, chest pain, shortness of breath or any other concerns other than her blood pressure still being elevated   Physical Exam   Triage Vital Signs: ED Triage Vitals  Encounter Vitals Group     BP 10/18/23 1409 (!) 208/119     Systolic BP Percentile --      Diastolic BP Percentile --      Pulse Rate 10/18/23 1409 74     Resp 10/18/23 1409 20     Temp 10/18/23 1414 98.4 F (36.9 C)     Temp Source 10/18/23 1414 Oral     SpO2 10/18/23 1409 99 %     Weight 10/18/23 1408 150 lb  (68 kg)     Height 10/18/23 1408 5\' 2"  (1.575 m)     Head Circumference --      Peak Flow --      Pain Score 10/18/23 1412 3     Pain Loc --      Pain Education --      Exclude from Growth Chart --     Most recent vital signs: Vitals:   10/18/23 1409 10/18/23 1414  BP: (!) 208/119   Pulse: 74   Resp: 20   Temp:  98.4 F (36.9 C)  SpO2: 99%      General: Awake, no distress.  CV:  Good peripheral perfusion.  Resp:  Normal effort.  Abd:  No distention. Soft and non tender  Other:  CN 2-12 intact- equal strength in arms and legs no Finger to nose intact  ED Results / Procedures / Treatments   Labs (all labs ordered are listed, but only abnormal results are displayed) Labs Reviewed  COMPREHENSIVE METABOLIC PANEL WITH GFR - Abnormal; Notable for the following components:      Result Value   Total Bilirubin 1.4 (*)    All other components within normal limits  CBC WITH  DIFFERENTIAL/PLATELET  TROPONIN I (HIGH SENSITIVITY)  TROPONIN I (HIGH SENSITIVITY)     EKG  My interpretation of EKG:  Normal sinus rate of 70 without any ST elevation or T wave inversions except for lead III, normal intervals  RADIOLOGY I have reviewed the CT personally and interpreted and no ICH    PROCEDURES:  Critical Care performed: No  Procedures   MEDICATIONS ORDERED IN ED: Medications - No data to display   IMPRESSION / MDM / ASSESSMENT AND PLAN / ED COURSE  I reviewed the triage vital signs and the nursing notes.   Patient's presentation is most consistent with acute presentation with potential threat to life or bodily function.   Patient comes in with elevated blood pressure with a headache and dizziness earlier that is since all resolved.  CT head ordered from triage without evidence of intercranial hemorrhage does not sound consistent with subarachnoid hemorrhage. Doubt posterior stroke- re[assurig neuro exam and symptoms have resolved.  Troponin ordered evaluate for ACS this  was negative.  When I initially went to the room her blood pressure was in the 140 systolic and after she started talking about everything with her blood pressure her blood pressure went up to the 200 systolic.  She denies any history of anxiety but does report this increasing stress about the political culture and is not sure if that could be contributing to her elevated blood pressure today.  We discussed going up on her amlodipine  to 5 mg and she actually has an appointment tomorrow with her PCP she can have her blood pressure rechecked then.  We discussed the harms of lowering it to acutely if she is asymptomatic that it is better to monitor for symptoms and return if she develops any new symptoms she expressed understanding and felt comfortable with that plan  Troponin is negative.  CBC reassuring CMP shows normal creatinine.  T. bili slightly elevated but his had some similar elevations previously  CT head is negative  The patient is on the cardiac monitor to evaluate for evidence of arrhythmia and/or significant heart rate changes.      FINAL CLINICAL IMPRESSION(S) / ED DIAGNOSES   Final diagnoses:  Essential hypertension     Rx / DC Orders   ED Discharge Orders     None        Note:  This document was prepared using Dragon voice recognition software and may include unintentional dictation errors.   Lubertha Rush, MD 10/18/23 1720    Lubertha Rush, MD 10/18/23 1722    Lubertha Rush, MD 10/18/23 [phone]

## 2023-10-18 NOTE — ED Notes (Signed)
 First nurse note: To ED from [facility] for HTN since this morning, no vision changes, endorses mild dull HA and mild dizziness. No CP or SOB.  192/120 was highest BP at [facility], other Bps included 161/64, 143/124, 188/101. Took metoprolol  and amlodipine  at 10am.

## 2023-10-18 NOTE — Telephone Encounter (Signed)
 Copied from CRM [id number]. Topic: Clinical - Red Word Triage >> Oct 18, 2023 11:05 AM Jenna Moan wrote: Red Word that prompted transfer to Nurse Triage: patient work up dizzy took blood pressure  1st reading was 164/101 and took it again and reading was 171/164 wants to make sure everything is ok or if her machine may be wrong Reason for Disposition  [1] MODERATE dizziness (e.g., interferes with normal activities) AND [2] has NOT been evaluated by doctor (or NP/PA) for this  (Exception: Dizziness caused by heat exposure, sudden standing, or poor fluid intake.)  Answer Assessment - Initial Assessment Questions 1. DESCRIPTION: "Describe your dizziness."     I'm having dizziness and my BP 164/101 when I woke up at 7:30 this morning and my head felt dizzy.     I took a 2nd dose of amlodipine  between 9:30 and 10:00     Rechecked BP it was 171/164  I'm not sure my BP machine is correct.   I took 1/2 metoprolol  then.     She checked it while on th phone with me.   It's 161/114.     No headache. I'm on Zepbound  injections.  I took a shot night before last.   It usually gives me a bad headache.   This time I didn't get a headache. 2. LIGHTHEADED: "Do you feel lightheaded?" (e.g., somewhat faint, woozy, weak upon standing)     I'm a little dizzy 3. VERTIGO: "Do you feel like either you or the room is spinning or tilting?" (i.e. vertigo)     no 4. SEVERITY: "How bad is it?"  "Do you feel like you are going to faint?" "Can you stand and walk?"   - MILD: Feels slightly dizzy, but walking normally.   - MODERATE: Feels unsteady when walking, but not falling; interferes with normal activities (e.g., school, work).   - SEVERE: Unable to walk without falling, or requires assistance to walk without falling; feels like passing out now.      Mild dizzy.   My dizziness has subsided now. 5. ONSET:  "When did the dizziness begin?"     This morning when I woke up at 7:30 AM. 6. AGGRAVATING FACTORS: "Does anything make it  worse?" (e.g., standing, change in head position)     Not sure 7. HEART RATE: "Can you tell me your heart rate?" "How many beats in 15 seconds?"  (Note: not all patients can do this)       Not asked 8. CAUSE: "What do you think is causing the dizziness?"     My BP maybe 9. RECURRENT SYMPTOM: "Have you had dizziness before?" If Yes, ask: "When was the last time?" "What happened that time?"     Not asked 10. OTHER SYMPTOMS: "Do you have any other symptoms?" (e.g., fever, chest pain, vomiting, diarrhea, bleeding)       No 11. PREGNANCY: "Is there any chance you are pregnant?" "When was your last menstrual period?"       N/a  Protocols used: Dizziness - Lightheadedness-A-AH  Chief Complaint: Pt woke up feeling dizzy.  BP checked and it's elevated.   See triage notes for details.  She took an extra amlodipine  and rechecked BP and she believes her machine may be off.   But took a 1/2 pill of her metoprolol  extra too      Wants to come in and bring her machine to see if it's off and to have her BP checked.   She is  leaving town tomorrow and would like to get this checked especially since she had dizziness.   It has subsided now.  Symptoms: Dizziness when she first got up.  It has subsided now. Frequency: This morning. Pertinent Negatives: Patient denies being dizzy now. Disposition: [] ED /[] Urgent Care (no appt availability in office) / [x] Appointment(In office/virtual)/ []  [facility]/ [] Home Care/ [] Refused Recommended Disposition /[] [facility]/ []  Follow-up with PCP Additional Notes: I made her an appt with Dr. Sueanne Emerald for 5/21 to have her BP checked and she is bringing her machine in too.   She would like to come in today if possible.   Please advise.   I advised her to change the batteries in her BP machine and recheck it.   The batteries have been in there for a yr.    If the reading is lower or normal she is going to call back in.   I let her know I would send a message and  see if she could be worked in today.   I advised her to keep the appt for tomorrow with Dr. Sueanne Emerald since she is having dizziness.   She was agreeable to this plan since she is leaving town tomorrow afternoon (Wed).

## 2023-10-18 NOTE — Discharge Instructions (Addendum)
 Increase your amlodipine  to 5 mg daily.  You can take 2.5 mg when you get home and then take 5 mg tomorrow morning before your appointment.  Then they can recheck your blood pressure tomorrow at the clinic at 10 AM.  Return to the ER if you develop any symptoms associated with your elevated blood pressure.  IMPRESSION: 1. No CT evidence for acute intracranial abnormality. 2. 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 3. 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

## 2023-10-18 NOTE — ED Triage Notes (Signed)
 Pt sent from [facility] for hypertension. Pt said her monitor was reading high this morning and went to [facility] to make sure it was accurate. Pt c/o mild dizziness this morning and pressure in her head. Pt took her amlodipine  which improved her BP and sx. Pt took half of a metoprolol  because her diastolic was still elevated. Then she called her PCP who recommended her get checked out.

## 2023-10-18 NOTE — Telephone Encounter (Addendum)
 Patient calling back to report BP readings after changing blood pressure machine batteries  Left Arm-161/131 Right Arm-143/124  Patient endorsing slight headache and states dizziness is gone. Denies CP, SOB, weakness.   Patient recommended in the meantime to have blood pressure rechecked at Urgent Care as patient and husband aren't sure if the home machine is correct. Patient is keeping her appointment tomorrow at PCP office and will be seen in Urgent Care today.

## 2023-10-18 NOTE — ED Notes (Signed)
 See triage notes. Patient c/o hypertension that she noted this morning. Patient also reported having a headache repeatedly two days after every ZepBound  treatment. Patient takes Amlodipine  for blood pressure currently.

## 2023-10-19 ENCOUNTER — Telehealth: Payer: Self-pay | Admitting: Internal Medicine

## 2023-10-19 ENCOUNTER — Encounter: Payer: Self-pay | Admitting: Family Medicine

## 2023-10-19 ENCOUNTER — Ambulatory Visit: Admitting: Family Medicine

## 2023-10-19 VITALS — BP 109/67 | HR 72 | Temp 98.3°F | Resp 20 | Ht 62.0 in | Wt 145.4 lb

## 2023-10-19 DIAGNOSIS — G4733 Obstructive sleep apnea (adult) (pediatric): Secondary | ICD-10-CM | POA: Diagnosis not present

## 2023-10-19 DIAGNOSIS — I480 Paroxysmal atrial fibrillation: Secondary | ICD-10-CM

## 2023-10-19 DIAGNOSIS — N1831 Chronic kidney disease, stage 3a: Secondary | ICD-10-CM | POA: Diagnosis not present

## 2023-10-19 DIAGNOSIS — D6869 Other thrombophilia: Secondary | ICD-10-CM | POA: Diagnosis not present

## 2023-10-19 DIAGNOSIS — I10 Essential (primary) hypertension: Secondary | ICD-10-CM

## 2023-10-19 MED ORDER — ZEPBOUND 5 MG/0.5ML ~~LOC~~ SOAJ: 5.0000 mg | SUBCUTANEOUS | 0 refills | Status: DC

## 2023-10-19 MED ORDER — AMLODIPINE BESYLATE 2.5 MG PO TABS: 2.5000 mg | ORAL_TABLET | Freq: Every day | ORAL | Status: DC

## 2023-10-19 NOTE — Telephone Encounter (Signed)
 Pt last saw Dr Marven Slimmer 12/01/22, last labs 10/18/23 Creat 0.96, age 73, weight 68kg, based on specified criteria pt is on appropriate dosage of Eliquis  5mg  BID for afib.  Will refill rx.

## 2023-10-19 NOTE — Patient Instructions (Addendum)
 It was a pleasure meeting you today. Thank you for allowing me to take part in your health care.  Our goals for today as we discussed include:  Blood pressure has significantly improved. Go back to taking the 2.5 mg in the morning.  Check blood pressure throughout day. If > 140/90 take an extra dose of Amlodipine  2.5 mg  Continue with Metoprolol  as prescribed by Cardiology Consider switching to Carvedilol  Decrease Zepbound  to 5 mg weekly.   Stop Zepbound  7.5 mg monthly   This is a list of the screening recommended for you and due dates:  Health Maintenance  Topic Date Due   Medicare Annual Wellness Visit  03/04/2016   Cologuard (Stool DNA test)  01/06/2023   COVID-19 Vaccine (5 - 2024-25 season) 01/30/2023   Mammogram  08/06/2023   Flu Shot  12/30/2023   DTaP/Tdap/Td vaccine (2 - Td or Tdap) 06/11/2027   Pneumonia Vaccine  Completed   DEXA scan (bone density measurement)  Completed   Hepatitis C Screening  Completed   Zoster (Shingles) Vaccine  Completed   HPV Vaccine  Aged Out   Meningitis B Vaccine  Aged Out      If you have any questions or concerns, please do not hesitate to call the office at [phone].  I look forward to our next visit and until then take care and stay safe.  Regards,   Valli Gaw, MD   [facility]

## 2023-10-19 NOTE — Telephone Encounter (Signed)
 Lm for patient to call and schedule a 2 week follow up with Dr Sueanne Emerald. Dr Sueanne Emerald would like to see her since she was seen by her today.  E2c2 please schedule 2 week follow up with Dr Sueanne Emerald.

## 2023-10-24 ENCOUNTER — Encounter: Payer: Self-pay | Admitting: Family Medicine

## 2023-10-24 NOTE — Assessment & Plan Note (Signed)
 Significant weight loss achieved with Mounjaro  and Zepbound . Current weight 145 lbs, goal 125-130 lbs. Experiencing Zepbound  side effects with current dose at 7.5 mg monthly.  Agreeable to dosage adjustment. - Trial Zepbound  5 mg weekly for one month. - If side effects persist, consider reducing to 2.5 mg weekly.

## 2023-10-24 NOTE — Assessment & Plan Note (Signed)
 Stage 2 chronic kidney disease. Emphasized adequate hydration. - Encourage adequate hydration.

## 2023-10-24 NOTE — Assessment & Plan Note (Signed)
 Atrial fibrillation with previous ablation. Currently on metoprolol  for rate control. Discussed potential Eliquis  discontinuation, no changes made. Consideration of carvedilol for combined control. - Continue metoprolol  as prescribed. - Discuss Eliquis  discontinuation with cardiologist. - Consider carvedilol as an alternative with cardiologist.

## 2023-10-24 NOTE — Progress Notes (Signed)
 SUBJECTIVE:   Chief Complaint  Patient presents with   Hypertension    161/131 yesterday Pt went to ED yesterday   HPI Presents for follow ED visit  Discussed the use of AI scribe software for clinical note transcription with the patient, who gave verbal consent to proceed.  History of Present Illness Kathy Long "Kathy Long" is a 73 year old female with hypertension and atrial fibrillation who presents with elevated blood pressure and dizziness.  She experienced dizziness and a headache upon waking yesterday morning. Her home blood pressure reading was elevated at approximately 171/162 mmHg before taking her medication. She took her usual dose of amlodipine  2.5 mg and a half dose of metoprolol , but her blood pressure remained high.  In the emergency room, her blood pressure was recorded at 192/162 mmHg. She underwent a CT scan, blood work, and an EKG, all of which returned normal results. She took 5 mg of amlodipine  this morning and her usual dose of metoprolol  last night. She has a history of atrial fibrillation and underwent an ablation. She was initially prescribed 50 mg of metoprolol  but experienced hallucinations, leading to a reduction to 25 mg. She takes 25 mg of metoprolol  at night. She has previously been on hydrochlorothiazide  but was advised to discontinue it.  She is on Zepbound  for weight management, taking 7.5 mg once a month. She experiences side effects such as nausea and headaches two days after administration. She has lost 45 pounds and is currently on maintenance therapy. She was previously on Mounjaro  and Zetval for weight management. She has a history of multiple surgeries, including back and foot surgeries, and has been less active, contributing to weight gain. She was prediabetic but is not currently diabetic. She has sleep apnea, managed with a mouth guard, and has experienced a reduction in severity with weight loss.  She takes alprazolam  2.5 mg at night for sleep,  sometimes adding Tylenol  PM. She also takes Celexa  20 mg and Zofran  as needed for nausea related to Zioptan. 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     PERTINENT PMH / PSH: As above  OBJECTIVE:  BP 109/67   Pulse 72   Temp 98.3 F (36.8 C)   Resp 20   Ht 5\' 2"  (1.575 m)   Wt 145 lb 6 oz (65.9 kg)   SpO2 98%   BMI 26.59 kg/m    Physical Exam Vitals reviewed.  Constitutional:      General: She is not in acute distress.    Appearance: Normal appearance. She is normal weight. She is not ill-appearing, toxic-appearing or diaphoretic.  Eyes:     General:        Right eye: No discharge.        Left eye: No discharge.     Conjunctiva/sclera: Conjunctivae normal.  Cardiovascular:     Rate and Rhythm: Normal rate and regular rhythm.     Heart sounds: Normal heart sounds.  Pulmonary:     Effort: Pulmonary effort is normal.     Breath sounds: Normal breath sounds.  Abdominal:     General: Bowel sounds are normal.  Musculoskeletal:        General: Normal range of motion.  Skin:    General: Skin is warm and dry.  Neurological:     General: No focal deficit present.     Mental Status: She is alert and oriented to person, place, and time. Mental  status is at baseline.  Psychiatric:        Mood and Affect: Mood normal.        Behavior: Behavior normal.        Thought Content: Thought content normal.        Judgment: Judgment normal.           10/19/2023   10:57 AM 08/04/2023    2:30 PM 02/14/2023    1:24 PM 08/13/2022    1:07 PM 01/14/2022   11:28 AM  Depression screen PHQ 2/9  Decreased Interest 0 0 0 0 0  Down, Depressed, Hopeless 0 0 0 0 0  PHQ - 2 Score 0 0 0 0 0  Altered sleeping 0 0 1 2 0  Tired, decreased energy 0 1 0 0 0  Change in appetite 0 0 0 0 0  Feeling bad or failure about yourself  0 0 0 0 0  Trouble concentrating 0 0 0 0 0  Moving slowly or fidgety/restless 0 0 0 0 0  Suicidal thoughts 0 0 0 0 0   PHQ-9 Score 0 1 1 2  0  Difficult doing work/chores Not difficult at all Not difficult at all Not difficult at all Somewhat difficult Not difficult at all      10/19/2023   10:58 AM 08/04/2023    2:30 PM 02/14/2023    1:24 PM 08/13/2022    1:07 PM  GAD 7 : Generalized Anxiety Score  Nervous, Anxious, on Edge 1 1 0 1  Control/stop worrying 1 1 0 0  Worry too much - different things 0 1 0 1  Trouble relaxing 0 0 0 1  Restless 0 0 0 0  Easily annoyed or irritable 0 0 0 0  Afraid - awful might happen 0 1 0 0  Total GAD 7 Score 2 4 0 3  Anxiety Difficulty Somewhat difficult Not difficult at all Not difficult at all Not difficult at all    ASSESSMENT/PLAN:  OSA (obstructive sleep apnea) Assessment & Plan: Significant weight loss achieved with Mounjaro  and Zepbound . Current weight 145 lbs, goal 125-130 lbs. Experiencing Zepbound  side effects with current dose at 7.5 mg monthly.  Agreeable to dosage adjustment. - Trial Zepbound  5 mg weekly for one month. - If side effects persist, consider reducing to 2.5 mg weekly.  Orders: -     Zepbound ; Inject 5 mg into the skin once a week.  Dispense: 3 mL; Refill: 0  Essential hypertension Assessment & Plan: Recent elevated blood pressure episode without organ damage. Current regimen includes amlodipine  and metoprolol . Discussed medication adjustments, no changes made. Emphasized monitoring to avoid hypotension. - Continue amlodipine  2.5 mg daily. - Monitor blood pressure regularly. - If blood pressure exceeds 140/90 mmHg, take an additional 2.5 mg of amlodipine . - Continue metoprolol  as prescribed. - Discuss potential switch to carvedilol with cardiologist.  Orders: -     amLODIPine  Besylate; Take 1 tablet (2.5 mg total) by mouth daily. TAKE ONE TABLET (2.5 MG) BY MOUTH EVERY DAY.  Can take additional 2.5 mg if blood pressure > 140/90  Hypercoagulable state due to paroxysmal atrial fibrillation Assessment & Plan: Atrial fibrillation with  previous ablation. Currently on metoprolol  for rate control. Discussed potential Eliquis  discontinuation, no changes made. Consideration of carvedilol for combined control. - Continue metoprolol  as prescribed. - Discuss Eliquis  discontinuation with cardiologist. - Consider carvedilol as an alternative with cardiologist.   Stage 3a chronic kidney disease (HCC) Assessment & Plan: Stage 2 chronic kidney disease.  Emphasized adequate hydration. - Encourage adequate hydration.     PDMP reviewed  Return in about 2 weeks (around 11/02/2023) for PCP.  Valli Gaw, MD

## 2023-10-24 NOTE — Assessment & Plan Note (Signed)
 Recent elevated blood pressure episode without organ damage. Current regimen includes amlodipine  and metoprolol . Discussed medication adjustments, no changes made. Emphasized monitoring to avoid hypotension. - Continue amlodipine  2.5 mg daily. - Monitor blood pressure regularly. - If blood pressure exceeds 140/90 mmHg, take an additional 2.5 mg of amlodipine . - Continue metoprolol  as prescribed. - Discuss potential switch to carvedilol with cardiologist.

## 2023-10-26 ENCOUNTER — Telehealth: Payer: Self-pay

## 2023-10-26 NOTE — Telephone Encounter (Signed)
 Left message to return call to our office.  Called pt to find out how her blood pressure is doing. Pt need to scheduled an appointment with PCP for follow up.

## 2023-10-26 NOTE — Telephone Encounter (Signed)
-----   Message from Valli Gaw sent at 10/24/2023  3:18 PM EDT ----- Call patient to check if blood pressure remaining at goal.  She was supposed to have follow up appointment schedule in 2 weeks and do not see one scheduled.  Can schedule with PCP if has availability around 06/04  Thanks

## 2023-11-10 DIAGNOSIS — D225 Melanocytic nevi of trunk: Secondary | ICD-10-CM | POA: Diagnosis not present

## 2023-11-10 DIAGNOSIS — D0362 Melanoma in situ of left upper limb, including shoulder: Secondary | ICD-10-CM | POA: Diagnosis not present

## 2023-11-10 DIAGNOSIS — Z85820 Personal history of malignant melanoma of skin: Secondary | ICD-10-CM | POA: Diagnosis not present

## 2023-11-10 DIAGNOSIS — D2262 Melanocytic nevi of left upper limb, including shoulder: Secondary | ICD-10-CM | POA: Diagnosis not present

## 2023-11-10 DIAGNOSIS — Z85828 Personal history of other malignant neoplasm of skin: Secondary | ICD-10-CM | POA: Diagnosis not present

## 2023-11-10 DIAGNOSIS — D2261 Melanocytic nevi of right upper limb, including shoulder: Secondary | ICD-10-CM | POA: Diagnosis not present

## 2023-11-10 DIAGNOSIS — R58 Hemorrhage, not elsewhere classified: Secondary | ICD-10-CM | POA: Diagnosis not present

## 2023-11-10 DIAGNOSIS — D485 Neoplasm of uncertain behavior of skin: Secondary | ICD-10-CM | POA: Diagnosis not present

## 2023-11-10 DIAGNOSIS — L820 Inflamed seborrheic keratosis: Secondary | ICD-10-CM | POA: Diagnosis not present

## 2023-11-10 DIAGNOSIS — D0359 Melanoma in situ of other part of trunk: Secondary | ICD-10-CM | POA: Diagnosis not present

## 2023-11-10 DIAGNOSIS — C44519 Basal cell carcinoma of skin of other part of trunk: Secondary | ICD-10-CM | POA: Diagnosis not present

## 2023-11-10 DIAGNOSIS — D2272 Melanocytic nevi of left lower limb, including hip: Secondary | ICD-10-CM | POA: Diagnosis not present

## 2023-11-16 ENCOUNTER — Encounter: Payer: Self-pay | Admitting: Internal Medicine

## 2023-11-17 ENCOUNTER — Ambulatory Visit: Payer: Self-pay

## 2023-11-17 ENCOUNTER — Other Ambulatory Visit: Payer: Self-pay | Admitting: Internal Medicine

## 2023-11-17 ENCOUNTER — Ambulatory Visit: Admission: EM | Admit: 2023-11-17 | Discharge: 2023-11-17 | Disposition: A | Discharge status: Home / Self Care

## 2023-11-17 ENCOUNTER — Encounter: Payer: Self-pay | Admitting: Emergency Medicine

## 2023-11-17 DIAGNOSIS — I10 Essential (primary) hypertension: Secondary | ICD-10-CM | POA: Diagnosis not present

## 2023-11-17 DIAGNOSIS — R42 Dizziness and giddiness: Secondary | ICD-10-CM

## 2023-11-17 NOTE — Telephone Encounter (Addendum)
 Pt called back to the office this afternoon due to blood pressure still being elevated at 191/104 asymptomatic. She spoke with the triage nurse and we advised the pt to go ahead and take the additional 2.5 mg dose of amlodipine  per her last office note from Dr. Freeda Jerry office. Also advised that if the bp doesn't come down after the additional dose that she would need to be evaluated either at the ED or UC. We have also scheduled pt for a follow up with Dr. Tullo on July 9th and advised her that if she needs to be seen sooner we will call her back. Pt stated that she does not have any symptoms.

## 2023-11-17 NOTE — Discharge Instructions (Addendum)
 Your blood pressure was 122/81 when you got here. After we had been talking, it rose to 166/86.    Follow up with your primary care provider tomorrow.  Go to the emergency department if you have concerning symptoms.

## 2023-11-17 NOTE — Telephone Encounter (Signed)
 FYI Only or Action Required?: Action required by provider: clinical question for provider and update on patient condition.  Patient was last seen in primary care on 10/19/2023 by Valli Gaw, MD. Called Nurse Triage reporting Hypertension. Symptoms began several weeks ago. Interventions attempted: Prescription medications: Amlodipine  2.5mg  and Metoprolol  XL. Symptoms are: gradually worsening.  Triage Disposition: Urgent Home Treatment With Follow-up Call  Patient/caregiver understands and will follow disposition?: Yes    Copied from CRM [id number]. Topic: Clinical - Red Word Triage >> Nov 17, 2023  3:56 PM Melissa C wrote: Red Word that prompted transfer to Nurse Triage: patient had recent blood pressure issues and went to ER and has been OK, however today she checked blood pressure again and it 172/98, took again and it was down a little but she just wants to speak with the nurse since she had recent blood pressure issues that sent her to the ER    FYI- Called [facility], spoke with Camilo Cella. Pt is to take 2nd dose of Amlodipine  2.5mg  and have NT follow-up within 1 hour. If BP is still unchanged or trending UP, pt is to go to Urgent Care (as long as she is asymptomatic). If patient has symptoms she is to return to the ED. Appt scheduled with PCP for July 9th at 11:30a. Pt made aware of this plan and verbalized understanding, continues to endorse no symptoms at this time.    Reason for Disposition  [1] Systolic BP  >= 180 OR Diastolic >= 110 AND [2] missed most recent dose of blood pressure medication  Answer Assessment - Initial Assessment Questions 1. BLOOD PRESSURE: What is the blood pressure? Did you take at least two measurements 5 minutes apart?     172/98; 190/100  2. ONSET: When did you take your blood pressure?     Check at 2pm today; taken while on the phone   3. HOW: How did you take your blood pressure? (e.g., automatic home BP monitor, visiting nurse)     Home BP  monitor  4. HISTORY: Do you have a history of high blood pressure?     Yes  5. MEDICINES: Are you taking any medicines for blood pressure? Have you missed any doses recently?     Amlodipine  and Metoprolol   6. OTHER SYMPTOMS: Do you have any symptoms? (e.g., blurred vision, chest pain, difficulty breathing, headache, weakness)     No symptoms at this time  7. PREGNANCY: Is there any chance you are pregnant? When was your last menstrual period?     No  Protocols used: Blood Pressure - High-A-AH

## 2023-11-17 NOTE — Telephone Encounter (Signed)
 See my chart message

## 2023-11-17 NOTE — Telephone Encounter (Signed)
 FYI Only or Action Required?: FYI only for provider.  Patient was last seen in primary care on 10/19/2023 by Valli Gaw, MD. Called Nurse Triage reporting Follow-up call. Symptoms began today. Interventions attempted: Prescription medications: Amlodipine . Symptoms are: unchanged.  Triage Disposition: Information or Advice Only Call  Patient/caregiver understands and will follow disposition?: Yes             Reason for Disposition  Health Information question, no triage required and triager able to answer question  Answer Assessment - Initial Assessment Questions 1. REASON FOR CALL or QUESTION: What is your reason for calling today? or How can I best help you? or What question do you have that I can help answer?   RN called back, Patient took 2nd dose of Amlodipine  and received a reading of 161/95. Patient is on the way to Urgent Care per MD advice now, and will follow-up as needed.  Protocols used: Information Only Call - No Triage-A-AH

## 2023-11-17 NOTE — Telephone Encounter (Signed)
 Unable to reach pt so I called pt's husband. Husband was not with pt but stated that he would call her right now and really the message to her that she needs to be seen at the ED. I let husband know that I would call him back in a few minutes to make sure he was able to get in touch with pt.

## 2023-11-17 NOTE — ED Provider Notes (Signed)
 Kathy Long    CSN: [id number] Arrival date & time: 11/17/23  1727      History   Chief Complaint Chief Complaint  Patient presents with   Hypertension    HPI Kathy Long is a 73 y.o. female.  Patient presents with concern for elevated blood pressure readings at home.  She had some lightheadedness yesterday and took her blood pressure which was elevated; it was 161/91. Later it was 191/104 and patient was instructed to go to the ED by her PCP.  Patient opted to come to the urgent care instead.  She is currently asymptomatic.  No lightheadedness since yesterday.  She denies dizziness, weakness, numbness, headache, chest pain, shortness of breath.  Patient was seen at [facility] ED on 10/18/2023 for diagnosis essential hypertension.  She was seen by her PCP on 10/19/2023 for diagnosis of obstructive sleep apnea, essential hypertension, hypercoagulable state due to paroxysmal atrial fibrillation, stage III CKD.  The history is provided by the patient and medical records.    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 There is tumor extending from  the cerebellar pontine angle cistern to the fundus of the internal  auditory canal. No mass-effect on the brainstem.     Adiposity 09/28/2014   Body mass index is 31.45 kg/(m^2).     Last Assessment & Plan:   I have addressed  BMI and recommended wt loss of 10% of body weight over the next 6 months using a low glycemic index diet and regular exercise a minimum of 5 days per week.     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 Has been having foot pain since May 2022.     CKD (chronic kidney disease)  stage 3, GFR 30-59 ml/min 04/13/2018   Nephrology referral recommended.      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   Postoperative bile leak    Right shoulder pain 10/17/2019   S/P laparoscopic cholecystectomy 07/19/2017   Squamous cell carcinoma of skin of face 10/21/2011   Overview:   SCC  of the right malar cheek treated with Mohs surgery 10/21/11     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     Patient Active Problem List   Diagnosis Date Noted   Hypercoagulable state due to paroxysmal atrial fibrillation 09/21/2022   Increased frequency of urination 02/26/2022   Paroxysmal atrial fibrillation 10/09/2021   Aortic atherosclerosis 05/12/2021   History of COVID-19 05/12/2021   BMI 33.0-33.9, adult 03/24/2021   Esophageal dysphagia 12/11/2019   Hyperlipidemia LDL goal <100 12/10/2019    Arthritis of right acromioclavicular joint 08/15/2019   OSA (obstructive sleep apnea) 05/13/2019   CKD (chronic kidney disease) stage 3, GFR 30-59 ml/min 04/13/2018   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   S/P laparoscopic cholecystectomy 07/19/2017   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   Nocturia 12/09/2016   Major depressive disorder 06/05/2016   Adiposity 09/28/2014   Generalized anxiety disorder 11/19/2013   Gastro-esophageal reflux disease without esophagitis 11/19/2013   Squamous cell carcinoma of skin of face 10/21/2011   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   Melanoma 11/02/2010   Essential hypertension 04/10/2010   Osteoporosis 04/10/2010    Past Surgical History:  Procedure Laterality Date   ANKLE FRACTURE SURGERY Left    APPENDECTOMY  2010   ATRIAL FIBRILLATION ABLATION N/A 09/09/2022   Procedure: ATRIAL FIBRILLATION ABLATION;  Surgeon: Boyce Byes, MD;  Location: [facility];  Service: Cardiovascular;  Laterality: N/A;   BACK SURGERY     disectomy lumbar, scar tissue   BREAST CYST EXCISION Right 1970   axilla   buninectomy Bilateral    CATARACT EXTRACTION W/PHACO Left 04/12/2018   Procedure: CATARACT EXTRACTION PHACO AND INTRAOCULAR LENS PLACEMENT (IOC)  LEFT TORIC LENS;  Surgeon: Annell Kidney, MD;  Location: [facility];  Service: Ophthalmology;  Laterality: Left;   CATARACT EXTRACTION W/PHACO Right 05/10/2018   Procedure: CATARACT EXTRACTION PHACO AND INTRAOCULAR LENS PLACEMENT (IOC)  RIGHT TORIC LENS;  Surgeon: Annell Kidney, MD;  Location: [facility];  Service: Ophthalmology;  Laterality: Right;  DIABETIC   CHOLECYSTECTOMY N/A 07/05/2017   Procedure: LAPAROSCOPIC CHOLECYSTECTOMY WITH INTRAOPERATIVE CHOLANGIOGRAM;  Surgeon: Marshall Skeeter, MD;  Location: [facility];  Service: General;  Laterality: N/A;   COLONOSCOPY     2009 and color gaurd in 2018   COLONOSCOPY N/A  03/25/2020   Procedure: COLONOSCOPY;  Surgeon: Shane Darling, MD;  Location: [facility];  Service: Endoscopy;  Laterality: N/A;   DRUG INDUCED ENDOSCOPY N/A 05/06/2021   Procedure: DRUG INDUCED SLEEP ENDOSCOPY;  Surgeon: Virgina Grills, MD;  Location: [facility];  Service: ENT;  Laterality: N/A;   ERCP N/A 07/12/2017   Procedure: ENDOSCOPIC RETROGRADE CHOLANGIOPANCREATOGRAPHY (ERCP);  Surgeon: Marnee Sink, MD;  Location: [facility];  Service: Endoscopy;  Laterality: N/A;   ERCP N/A 10/04/2017   Procedure: ENDOSCOPIC RETROGRADE CHOLANGIOPANCREATOGRAPHY (ERCP);  Surgeon: Marnee Sink, MD;  Location: [facility];  Service: Endoscopy;  Laterality: N/A;   FOOT SURGERY     x 2   INCISIONAL HERNIA REPAIR N/A 12/04/2020   Procedure: VENTRAL INCISIONAL HERNIA REPAIR WITH MESH;  Surgeon: Oralee Billow, MD;  Location: [facility];  Service: General;  Laterality: N/A;   MELANOMA EXCISION Left 2000   PALATOPLASTY N/A 04/08/2015   Procedure: PALATOPLASTY;  Surgeon: Lesly Raspberry, MD;  Location: [facility];  Service: ENT;  Laterality: N/A;   TONSILLECTOMY N/A 04/08/2015   Procedure: TONSILLECTOMY;  Surgeon: Lesly Raspberry, MD;  Location: [facility];  Service: ENT;  Laterality:  N/A;   TUBAL LIGATION      OB History     Gravida  3   Para  3   Term      Preterm      AB      Living  3      SAB      IAB      Ectopic      Multiple      Live Births           Obstetric Comments  Menstrual age: 35  Age 1st Pregnancy: 5           Home Medications    Prior to Admission medications   Medication Sig Start Date End Date Taking? Authorizing Provider  ALPRAZolam  (XANAX ) 0.25 MG tablet TAKE ONE TABLET BY MOUTH AT BEDTIME 09/08/23   Thersia Flax, MD  amLODipine  (NORVASC ) 2.5 MG tablet Take 1 tablet (2.5 mg total) by mouth daily. TAKE ONE TABLET (2.5 MG) BY MOUTH EVERY DAY.  Can take additional 2.5 mg if blood pressure > 140/90 10/19/23   Valli Gaw, MD   apixaban  (ELIQUIS ) 5 MG TABS tablet TAKE 1 TABLET BY MOUTH 2 TIMES DAILY. 10/19/23   Boyce Byes, MD  benzonatate  (TESSALON ) 100 MG capsule TAKE 2 CAPSULES BY MOUTH 3 TIMES DAILY AS NEEDED FOR COUGH. 09/09/23   Thersia Flax, MD  Cholecalciferol  125 MCG (5000 UT) TABS Take 5,000 Units by mouth in the morning.    [provider]  citalopram  (CELEXA ) 20 MG tablet TAKE 1 TABLET BY MOUTH DAILY 07/28/23   Thersia Flax, MD  clobetasol  (TEMOVATE ) 0.05 % external solution APPLY DAILY UNTIL RASH IS CLEAR Patient taking differently: Apply 1 Application topically daily as needed (Eczema). 03/02/22   Thersia Flax, MD  Coenzyme Q10 100 MG capsule Take 100 mg by mouth daily.    [provider]  Famotidine (ACID CONTROLLER PO) Take by mouth daily.    [provider]  metoprolol  succinate (TOPROL -XL) 25 MG 24 hr tablet Take 1 tablet (25 mg total) by mouth daily. Take with or immediately following a meal. 01/21/23   Gollan, Timothy J, MD  ondansetron  (ZOFRAN ) 4 MG tablet TAKE 1 TABLET BY MOUTH EVERY 8 HOURS AS NEEDED FOR NAUSEA OR VOMITING 08/08/23   Thersia Flax, MD  rosuvastatin  (CRESTOR ) 10 MG tablet Take 1 tablet (10 mg total) by mouth daily. 07/28/23   Thersia Flax, MD  tirzepatide  (ZEPBOUND ) 5 MG/0.5ML Pen Inject 5 mg into the skin once a week. 10/19/23   Valli Gaw, MD    Family History Family History  Problem Relation Age of Onset   Cancer Sister 1       ovarian ca   Breast cancer Other    Alzheimer's disease Maternal Uncle        x2    Social History Social History   Tobacco Use   Smoking status: Never   Smokeless tobacco: Never   Tobacco comments:    smoked socially in college  Vaping Use   Vaping status: Never Used  Substance Use Topics   Alcohol use: Yes    Alcohol/week: 0.0 standard drinks of alcohol    Comment: occ - may have 1 glass wine/month   Drug use: No     Allergies   Morphine  and codeine, Tramadol, and Tape   Review of  Systems Review of Systems  Respiratory:  Negative for cough and shortness of breath.   Cardiovascular:  Negative for chest pain and palpitations.  Gastrointestinal:  Negative for nausea and vomiting.  Neurological:  Positive for light-headedness. Negative for dizziness, facial asymmetry, speech difficulty, weakness, numbness and headaches.     Physical Exam Triage Vital Signs ED Triage Vitals  Encounter Vitals Group     BP      Girls Systolic BP Percentile      Girls Diastolic BP Percentile      Boys Systolic BP Percentile      Boys Diastolic BP Percentile      Pulse      Resp      Temp      Temp src      SpO2      Weight      Height      Head Circumference      Peak Flow      Pain Score      Pain Loc      Pain Education      Exclude from Growth Chart    No data found.  Updated Vital Signs BP (!) 166/86   Pulse (!) 59   Temp 97.7 F (36.5 C)   Resp 18   SpO2 99%   Visual Acuity Right Eye Distance:   Left Eye Distance:   Bilateral Distance:    Right Eye Near:   Left Eye Near:    Bilateral Near:     Physical Exam Constitutional:      General: She is not in acute distress. HENT:     Mouth/Throat:     Mouth: Mucous membranes are moist.   Cardiovascular:     Rate and Rhythm: Normal rate and regular rhythm.     Heart sounds: Normal heart sounds.  Pulmonary:     Effort: Pulmonary effort is normal. No respiratory distress.     Breath sounds: Normal breath sounds.   Musculoskeletal:     Right lower leg: No edema.     Left lower leg: No edema.   Neurological:     General: No focal deficit present.     Mental Status: She is alert and oriented to person, place, and time.     Sensory: No sensory deficit.     Motor: No weakness.     Gait: Gait normal.      UC Treatments / Results  Labs (all labs ordered are listed, but only abnormal results are displayed) Labs Reviewed - No data to display  EKG   Radiology No results  found.  Procedures Procedures (including critical care time)  Medications Ordered in UC Medications - No data to display  Initial Impression / Assessment and Plan / UC Course  I have reviewed the triage vital signs and the nursing notes.  Pertinent labs & imaging results that were available during my care of the patient were reviewed by me and considered in my medical decision making (see chart for details).    Elevated blood pressure reading with hypertension, lightheadedness yesterday.  Patient is currently asymptomatic.  She declines transfer to the ED.  She declines EKG.  Her blood pressure on arrival was 122/81 but increased to 166/86 while conversing with this NP.  Instructed her to follow-up with her PCP tomorrow.  ED precautions given.  Education provided on managing hypertension.  Patient agrees to plan of care.  Final Clinical Impressions(s) / UC Diagnoses   Final diagnoses:  Elevated blood pressure reading in office with diagnosis of hypertension  Lightheadedness     Discharge Instructions  Your blood pressure was 122/81 when you got here. After we had been talking, it rose to 166/86.    Follow up with your primary care provider tomorrow.  Go to the emergency department if you have concerning symptoms.             ED Prescriptions   None    PDMP not reviewed this encounter.   Wellington Half, NP 11/17/23 [phone]

## 2023-11-17 NOTE — ED Triage Notes (Signed)
 Blood pressure was high this morning BP  175/98. Triage nurse told her to check BP at  4:30 pm and results was 171/100. Triage nurse told patient to take extra dose of BP medications and they would call her back in a hour. Primary doctor told her to come to an UC. Patient denies CP, SOB or headache.

## 2023-11-18 ENCOUNTER — Other Ambulatory Visit: Payer: Self-pay | Admitting: Internal Medicine

## 2023-11-18 DIAGNOSIS — Z1231 Encounter for screening mammogram for malignant neoplasm of breast: Secondary | ICD-10-CM

## 2023-11-18 NOTE — Telephone Encounter (Signed)
 LMTCB

## 2023-11-20 NOTE — Progress Notes (Unsigned)
 Cardiology Office Note  Date:  11/21/2023   ID:  MELVINIA ASHBY, DOB 05/08/1951, MRN [id number]  PCP:  Marylynn Verneita CROME, MD   Chief Complaint  Patient presents with   12 month follow up     Patient c/o fluctuating blood pressure; was told to increase the amlodipine  to 5 mg. The patient only took amlodipine  2.5 mg this am.     HPI:  Kathy Long is a 73 y.o. female with a hx of  hypertension,  Hyperlipidemia Mild aortic atherosclerosis on CT scan March 2020 who presents due to paroxysmal atrial fibrillation.  Last seen by myself in clinic 5/24  Blood pressure running high Occasional periods of whoozy feeling Trips to the ER (200/119) and urgent care for her blood pressure Numbers would typically come down without intervention  Taking amlodipine  2.5 daily Feels that if she takes amlodipine  5 mg daily her blood pressure runs too low Pressure 140-145 /90s at home Rare spikes Feels warm politics is causing significant stress  She does have Xanax  but has not been taking any recently  Denies significant tachypalpitations concerning for arrhythmia  Previously seen by EP  ablation September 09, 2022 Did not tolerate flecainide  in the past  Continues on Metoprolol  succinate 25 daily in the evening  Diagnosed with OSA mild to moderate Has mouth piece, sleeping all night  EKG personally reviewed by myself on todays visit EKG Interpretation Date/Time:  Monday November 21 2023 09:15:14 EDT Ventricular Rate:  58 PR Interval:  156 QRS Duration:  76 QT Interval:  428 QTC Calculation: 420 R Axis:   -11  Text Interpretation: Sinus bradycardia Low voltage QRS When compared with ECG of 18-Oct-2023 14:17, Nonspecific T wave abnormality, worse in Lateral leads Confirmed by Perla Lye [phone]) on 11/21/2023 9:20:11 AM   Near syncope symptoms, emergency room March 31, 2022 Noted to be in atrial fibrillation  Echocardiogram January 2023 Normal ejection fraction, normal left atrial  size No significant MR  Prior records reviewed foot surgery for hammertoe correction 06/05/2021  went into atrial fibrillation.   CHA2DS2-VASc score 3(age, htn, gender). Seen by cardiology Xarelto  was increased up to 20 June 08, 2021  Echocardiogram ejection fraction 60 to 65%, grade 1 diastolic dysfunction, normal right heart pressures  Zio reviewed Paroxysmal atrial fibrillation noted on cardiac monitor.  26% burden,  Patient had a min HR of 42 bpm, max HR of 176 bpm, and avg HR of 70 bpm. Predominant underlying rhythm was Sinus Rhythm.   1 run of Ventricular Tachycardia occurred lasting 18 beats with a max rate of 162 bpm (avg 152 bpm).   21 Supraventricular Tachycardia runs occurred, the run with the fastest interval lasting 9 beats with a max rate of 139 bpm, the longest lasting 14 beats with an avg rate of 109 bpm.   Atrial Fibrillation occurred (26% burden), ranging from 57-176 bpm (avg of 89 bpm), the longest lasting 10 hours 21 mins with an avg rate of 84 bpm.  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), BMI 33.0-33.9, adult (03/24/2021), Chronic pain of right ankle (11/23/2021), CKD (chronic kidney disease) stage 3, GFR 30-59 ml/min (04/13/2018), Closed fracture of distal lateral malleolus of left ankle (12/28/2017), Esophageal dysphagia (12/11/2019), Essential hypertension (04/10/2010), Gastro-esophageal reflux disease without esophagitis (11/19/2013), Generalized anxiety disorder (11/19/2013), History of COVID-19 (05/12/2021), Hypercoagulable state due to paroxysmal atrial fibrillation (09/21/2022),  Hyperlipidemia LDL goal <100 (12/10/2019), Incisional hernia, without obstruction or gangrene (09/28/2014), Increased frequency of urination (02/26/2022), Major depressive disorder (06/05/2016),  Melanoma (11/02/2010), 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  PSH:    Past Surgical History:  Procedure Laterality Date   ANKLE FRACTURE SURGERY Left    APPENDECTOMY  2010   ATRIAL FIBRILLATION ABLATION N/A 09/09/2022   Procedure: ATRIAL FIBRILLATION ABLATION;  Surgeon: Cindie Ole DASEN, MD;  [facility]: [facility];  Service: Cardiovascular;  Laterality: N/A;   BACK SURGERY     disectomy lumbar, scar tissue   BREAST CYST EXCISION Right 1970   axilla   buninectomy Bilateral    CATARACT EXTRACTION W/PHACO Left 04/12/2018   Procedure: CATARACT EXTRACTION PHACO AND INTRAOCULAR LENS PLACEMENT (IOC)  LEFT TORIC LENS;  Surgeon: Mittie Gaskin, MD;  [facility]: [facility];  Service: Ophthalmology;  Laterality: Left;   CATARACT EXTRACTION W/PHACO Right 05/10/2018   Procedure: CATARACT EXTRACTION PHACO AND INTRAOCULAR LENS PLACEMENT (IOC)  RIGHT TORIC LENS;  Surgeon: Mittie Gaskin, MD;  Location: [facility];  Service: Ophthalmology;  Laterality: Right;  DIABETIC   CHOLECYSTECTOMY N/A 07/05/2017   Procedure: LAPAROSCOPIC CHOLECYSTECTOMY WITH INTRAOPERATIVE CHOLANGIOGRAM;  Surgeon: Dessa Reyes ORN, MD;  Location: [facility];  Service: General;  Laterality: N/A;   COLONOSCOPY     2009 and color gaurd in 2018   COLONOSCOPY N/A 03/25/2020   Procedure: COLONOSCOPY;  Surgeon: Maryruth Ole DASEN, MD;  Location: [facility];  Service: Endoscopy;  Laterality: N/A;   DRUG INDUCED  ENDOSCOPY N/A 05/06/2021   Procedure: DRUG INDUCED SLEEP ENDOSCOPY;  Surgeon: Carlie Clark, MD;  Location: [facility];  Service: ENT;  Laterality: N/A;   ERCP N/A 07/12/2017   Procedure: ENDOSCOPIC RETROGRADE CHOLANGIOPANCREATOGRAPHY (ERCP);  Surgeon: Jinny Carmine, MD;  Location: [facility];  Service: Endoscopy;  Laterality: N/A;   ERCP N/A 10/04/2017   Procedure: ENDOSCOPIC RETROGRADE CHOLANGIOPANCREATOGRAPHY (ERCP);  Surgeon: Jinny Carmine, MD;  Location: [facility];  Service: Endoscopy;  Laterality: N/A;   FOOT SURGERY     x 2   INCISIONAL HERNIA REPAIR N/A 12/04/2020   Procedure: VENTRAL INCISIONAL HERNIA REPAIR WITH MESH;  Surgeon: Eletha Boas, MD;  Location: [facility];  Service: General;  Laterality: N/A;   MELANOMA EXCISION Left 2000   PALATOPLASTY N/A 04/08/2015   Procedure: PALATOPLASTY;  Surgeon: Chinita Hasten, MD;  Location: [facility];  Service: ENT;  Laterality: N/A;   TONSILLECTOMY N/A 04/08/2015   Procedure: TONSILLECTOMY;  Surgeon: Chinita Hasten, MD;  Location: [facility];  Service: ENT;  Laterality: N/A;   TUBAL LIGATION      Current Outpatient Medications  Medication Sig Dispense Refill   ALPRAZolam  (XANAX ) 0.25 MG tablet TAKE ONE TABLET BY MOUTH AT BEDTIME 30 tablet 2   amLODipine  (NORVASC ) 5 MG tablet Take 5 mg by mouth daily.     apixaban  (ELIQUIS ) 5 MG TABS tablet TAKE 1 TABLET BY MOUTH 2 TIMES DAILY. 180 tablet 1   benzonatate  (TESSALON ) 100 MG capsule TAKE 2 CAPSULES BY MOUTH 3 TIMES DAILY AS NEEDED FOR COUGH. 30 capsule 0   Cholecalciferol  125 MCG (5000 UT) TABS Take 5,000 Units by mouth in the morning.  citalopram  (CELEXA ) 20 MG tablet TAKE 1 TABLET BY MOUTH DAILY 90 tablet 1   clobetasol  (TEMOVATE ) 0.05 % external solution APPLY DAILY UNTIL RASH IS CLEAR 50 mL 0   Coenzyme Q10 100 MG capsule Take 100 mg by mouth daily.     Famotidine (ACID CONTROLLER PO) Take by mouth daily.     metoprolol  succinate (TOPROL -XL) 25 MG 24 hr tablet Take 1  tablet (25 mg total) by mouth daily. Take with or immediately following a meal. 90 tablet 3   ondansetron  (ZOFRAN ) 4 MG tablet TAKE 1 TABLET BY MOUTH EVERY 8 HOURS AS NEEDED FOR NAUSEA OR VOMITING 20 tablet 0   rosuvastatin  (CRESTOR ) 10 MG tablet Take 1 tablet (10 mg total) by mouth daily. 90 tablet 1   tirzepatide  (ZEPBOUND ) 5 MG/0.5ML Pen Inject 5 mg into the skin once a week. 3 mL 0   amLODipine  (NORVASC ) 2.5 MG tablet Take 1 tablet (2.5 mg total) by mouth daily. TAKE ONE TABLET (2.5 MG) BY MOUTH EVERY DAY.  Can take additional 2.5 mg if blood pressure > 140/90 (Patient not taking: Reported on 11/21/2023)     No current facility-administered medications for this visit.    Allergies:   Morphine  and codeine, Tramadol, and Tape   Social History:  The patient  reports that she has never smoked. She has never used smokeless tobacco. She reports current alcohol use. She reports that she does not use drugs.   Family History:   family history includes Alzheimer's disease in her maternal uncle; Breast cancer in an other family member; Cancer (age of onset: 37) in her sister.    Review of Systems: Review of Systems  Constitutional: Negative.   HENT: Negative.    Respiratory: Negative.    Cardiovascular: Negative.   Gastrointestinal: Negative.   Musculoskeletal: Negative.   Neurological: Negative.   Psychiatric/Behavioral: Negative.    All other systems reviewed and are negative.  PHYSICAL EXAM: VS:  BP (!) 160/90 (BP Location: Left Arm, Patient Position: Sitting, Cuff Size: Normal)   Pulse (!) 58   Ht 5' 2 (1.575 m)   Wt 148 lb 4 oz (67.2 kg)   SpO2 98%   BMI 27.12 kg/m  , BMI Body mass index is 27.12 kg/m. Constitutional:  oriented to person, place, and time. No distress.  HENT:  Head: Grossly normal Eyes:  no discharge. No scleral icterus.  Neck: No JVD, no carotid bruits  Cardiovascular: Regular rate and rhythm, no murmurs appreciated Pulmonary/Chest: Clear to auscultation  bilaterally, no wheezes or rails Abdominal: Soft.  no distension.  no tenderness.  Musculoskeletal: Normal range of motion Neurological:  normal muscle tone. Coordination normal. No atrophy Skin: Skin warm and dry Psychiatric: normal affect, pleasant   Recent Labs: 07/27/2023: TSH 0.84 10/18/2023: ALT 35; BUN 18; Creatinine, Ser 0.96; Hemoglobin 13.7; Platelets 224; Potassium 4.3; Sodium 140    Lipid Panel Lab Results  Component Value Date   CHOL 168 07/27/2023   HDL 48.70 07/27/2023   LDLCALC 84 07/27/2023   TRIG 173.0 (H) 07/27/2023      Wt Readings from Last 3 Encounters:  11/21/23 148 lb 4 oz (67.2 kg)  10/19/23 145 lb 6 oz (65.9 kg)  10/18/23 150 lb (68 kg)     ASSESSMENT AND PLAN:  Problem List Items Addressed This Visit       Cardiology Problems   Hypercoagulable state due to paroxysmal atrial fibrillation   Relevant Medications   amLODipine  (NORVASC ) 5 MG tablet  Aortic atherosclerosis   Relevant Medications   amLODipine  (NORVASC ) 5 MG tablet   Other Relevant Orders   EKG 12-Lead (Completed)   Paroxysmal atrial fibrillation - Primary   Relevant Medications   amLODipine  (NORVASC ) 5 MG tablet   Other Relevant Orders   EKG 12-Lead (Completed)     Other   CKD (chronic kidney disease) stage 3, GFR 30-59 ml/min   OSA (obstructive sleep apnea)   Other Visit Diagnoses       Primary hypertension       Relevant Medications   amLODipine  (NORVASC ) 5 MG tablet   Other Relevant Orders   EKG 12-Lead (Completed)     Pure hypercholesterolemia       Relevant Medications   amLODipine  (NORVASC ) 5 MG tablet      Paroxysmal atrial fibrillation Managed by EP, underwent ablation  Denies breakthrough arrhythmia  Tolerating on metoprolol  and Eliquis   Essential hypertension Recommend she increase amlodipine  up to 2.5 twice daily Continue metoprolol  succinate 25 in the evening For spikes in pressure consider taking Xanax , extra amlodipine  2.5 as needed Avoid  stress from world politics  Hyperlipidemia Recommend she continue Crestor  10,  mild aortic atherosclerosis on CT scan     Signed, Velinda Lunger, M.D., Ph.D. [facility] [place], [place] [phone]

## 2023-11-21 ENCOUNTER — Ambulatory Visit: Attending: Cardiovascular Disease | Admitting: Cardiovascular Disease

## 2023-11-21 ENCOUNTER — Encounter: Payer: Self-pay | Admitting: Cardiovascular Disease

## 2023-11-21 VITALS — BP 140/86 | HR 58 | Ht 62.0 in | Wt 148.2 lb

## 2023-11-21 DIAGNOSIS — I700 Atherosclerosis of aorta: Secondary | ICD-10-CM | POA: Diagnosis not present

## 2023-11-21 DIAGNOSIS — N1831 Chronic kidney disease, stage 3a: Secondary | ICD-10-CM

## 2023-11-21 DIAGNOSIS — I10 Essential (primary) hypertension: Secondary | ICD-10-CM | POA: Diagnosis not present

## 2023-11-21 DIAGNOSIS — I480 Paroxysmal atrial fibrillation: Secondary | ICD-10-CM

## 2023-11-21 DIAGNOSIS — D6869 Other thrombophilia: Secondary | ICD-10-CM

## 2023-11-21 DIAGNOSIS — E7800 Pure hypercholesterolemia, unspecified: Secondary | ICD-10-CM

## 2023-11-21 DIAGNOSIS — G4733 Obstructive sleep apnea (adult) (pediatric): Secondary | ICD-10-CM

## 2023-11-21 MED ORDER — METOPROLOL SUCCINATE ER 25 MG PO TB24: 25.0000 mg | ORAL_TABLET | Freq: Every day | ORAL | 3 refills | Status: AC

## 2023-11-21 MED ORDER — AMLODIPINE BESYLATE 2.5 MG PO TABS: 2.5000 mg | ORAL_TABLET | Freq: Two times a day (BID) | ORAL | 3 refills | Status: AC

## 2023-11-21 NOTE — Telephone Encounter (Signed)
 Noted. Rollene Northern, NP reviewed the UC note.

## 2023-11-21 NOTE — Patient Instructions (Addendum)
 Medication Instructions:   Increase amlodipine  up to 2.5 mg twice a day  For spikes in pressure, take extra amlodipine , also consider alprazolam   If you need a refill on your cardiac medications before your next appointment, please call your pharmacy.   Lab work: No new labs needed  Testing/Procedures: No new testing needed  Follow-Up: At [facility], you and your health needs are our priority.  As part of our continuing mission to provide you with exceptional heart care, we have created designated Provider Care Teams.  These Care Teams include your primary Cardiologist (physician) and Advanced Practice Providers (APPs -  Physician Assistants and Nurse Practitioners) who all work together to provide you with the care you need, when you need it.  You will need a follow up appointment in 12 months  Providers on your designated Care Team:   Lonni Meager, NP Bernardino Bring, PA-C Cadence [place], [place]  COVID-19 Vaccine Information can be found at: [email or web address] For questions related to vaccine distribution or appointments, please email [email or web address] or call 4131421867.

## 2023-11-22 NOTE — Telephone Encounter (Signed)
 Pt has appt scheduled for 12/07/23

## 2023-11-28 ENCOUNTER — Emergency Department

## 2023-11-28 ENCOUNTER — Emergency Department
Admission: EM | Admit: 2023-11-28 | Discharge: 2023-11-29 | Disposition: A | Discharge status: Home / Self Care | Attending: Emergency Medicine | Admitting: Emergency Medicine

## 2023-11-28 ENCOUNTER — Other Ambulatory Visit: Payer: Self-pay

## 2023-11-28 DIAGNOSIS — R42 Dizziness and giddiness: Secondary | ICD-10-CM | POA: Insufficient documentation

## 2023-11-28 DIAGNOSIS — Z79899 Other long term (current) drug therapy: Secondary | ICD-10-CM | POA: Diagnosis not present

## 2023-11-28 DIAGNOSIS — R4701 Aphasia: Secondary | ICD-10-CM | POA: Diagnosis not present

## 2023-11-28 DIAGNOSIS — I10 Essential (primary) hypertension: Secondary | ICD-10-CM | POA: Diagnosis not present

## 2023-11-28 DIAGNOSIS — I482 Chronic atrial fibrillation, unspecified: Secondary | ICD-10-CM | POA: Insufficient documentation

## 2023-11-28 DIAGNOSIS — I6782 Cerebral ischemia: Secondary | ICD-10-CM | POA: Diagnosis not present

## 2023-11-28 DIAGNOSIS — G459 Transient cerebral ischemic attack, unspecified: Secondary | ICD-10-CM | POA: Diagnosis not present

## 2023-11-28 DIAGNOSIS — I129 Hypertensive chronic kidney disease with stage 1 through stage 4 chronic kidney disease, or unspecified chronic kidney disease: Secondary | ICD-10-CM | POA: Diagnosis not present

## 2023-11-28 DIAGNOSIS — Z7901 Long term (current) use of anticoagulants: Secondary | ICD-10-CM | POA: Insufficient documentation

## 2023-11-28 DIAGNOSIS — R404 Transient alteration of awareness: Secondary | ICD-10-CM | POA: Insufficient documentation

## 2023-11-28 DIAGNOSIS — R200 Anesthesia of skin: Secondary | ICD-10-CM | POA: Diagnosis not present

## 2023-11-28 DIAGNOSIS — R9082 White matter disease, unspecified: Secondary | ICD-10-CM | POA: Diagnosis not present

## 2023-11-28 DIAGNOSIS — N189 Chronic kidney disease, unspecified: Secondary | ICD-10-CM | POA: Diagnosis not present

## 2023-11-28 DIAGNOSIS — I4891 Unspecified atrial fibrillation: Secondary | ICD-10-CM | POA: Diagnosis not present

## 2023-11-28 DIAGNOSIS — R202 Paresthesia of skin: Secondary | ICD-10-CM | POA: Diagnosis present

## 2023-11-28 LAB — CBC WITH DIFFERENTIAL/PLATELET
Abs Immature Granulocytes: 0.02 10*3/uL (ref 0.00–0.07)
Basophils Absolute: 0.1 10*3/uL (ref 0.0–0.1)
Basophils Relative: 1 %
Eosinophils Absolute: 0.3 10*3/uL (ref 0.0–0.5)
Eosinophils Relative: 4 %
HCT: 42.5 % (ref 36.0–46.0)
Hemoglobin: 14.5 g/dL (ref 12.0–15.0)
Immature Granulocytes: 0 %
Lymphocytes Relative: 30 %
Lymphs Abs: 2 10*3/uL (ref 0.7–4.0)
MCH: 32.4 pg (ref 26.0–34.0)
MCHC: 34.1 g/dL (ref 30.0–36.0)
MCV: 95.1 fL (ref 80.0–100.0)
Monocytes Absolute: 0.6 10*3/uL (ref 0.1–1.0)
Monocytes Relative: 9 %
Neutro Abs: 3.7 10*3/uL (ref 1.7–7.7)
Neutrophils Relative %: 56 %
Platelets: 241 10*3/uL (ref 150–400)
RBC: 4.47 MIL/uL (ref 3.87–5.11)
RDW: 12 % (ref 11.5–15.5)
WBC: 6.7 10*3/uL (ref 4.0–10.5)
nRBC: 0 % (ref 0.0–0.2)

## 2023-11-28 LAB — COMPREHENSIVE METABOLIC PANEL WITH GFR
ALT: 43 U/L (ref 0–44)
AST: 42 U/L — ABNORMAL HIGH (ref 15–41)
Albumin: 4.5 g/dL (ref 3.5–5.0)
Alkaline Phosphatase: 92 U/L (ref 38–126)
Anion gap: 8 (ref 5–15)
BUN: 21 mg/dL (ref 8–23)
CO2: 26 mmol/L (ref 22–32)
Calcium: 9.8 mg/dL (ref 8.9–10.3)
Chloride: 108 mmol/L (ref 98–111)
Creatinine, Ser: 1.26 mg/dL — ABNORMAL HIGH (ref 0.44–1.00)
GFR, Estimated: 45 mL/min — ABNORMAL LOW (ref >60)
Glucose, Bld: 102 mg/dL — ABNORMAL HIGH (ref 70–99)
Potassium: 4 mmol/L (ref 3.5–5.1)
Sodium: 142 mmol/L (ref 135–145)
Total Bilirubin: 1.3 mg/dL — ABNORMAL HIGH (ref 0.0–1.2)
Total Protein: 7.2 g/dL (ref 6.5–8.1)

## 2023-11-28 LAB — CBG MONITORING, ED: Glucose-Capillary: 93 mg/dL (ref 70–99)

## 2023-11-28 LAB — APTT: aPTT: 36 s (ref 24–36)

## 2023-11-28 LAB — PROTIME-INR
INR: 1.2 (ref 0.8–1.2)
Prothrombin Time: 15.5 s — ABNORMAL HIGH (ref 11.4–15.2)

## 2023-11-28 LAB — ETHANOL: Alcohol, Ethyl (B): <15 mg/dL (ref <15)

## 2023-11-28 NOTE — ED Provider Notes (Signed)
 [facility] Provider Note    Event Date/Time   First MD Initiated Contact with Patient 11/28/23 2153     (approximate)   History   Chief Complaint: Aphasia   HPI  Kathy Long is a 73 y.o. female with a past history of CKD, atrial fibrillation who comes ED complaining of word finding difficulty that started around 7:00 PM today.  Followed by right hand paresthesia.  Symptoms lasted about 30 to 45 minutes in total and then resolved prior to arrival to the ED.  Denies headache, no trauma or fever.  Does note that she has had minimal water intake today, been exposed to hot environments, and has also been having difficulty with blood pressure control lately with episodes of hypotension when her amlodipine  dose was escalated.  Denies motor weakness        Past Medical History:  Diagnosis Date   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   Per August 2021 MRI Brain with IAC's:     Left vestibular schwannoma is stable. There is tumor extending from  the cerebellar pontine angle cistern to the fundus of the internal  auditory canal. No mass-effect on the brainstem.     Adiposity 09/28/2014   Body mass index is 31.45 kg/(m^2).     Last Assessment & Plan:   I have addressed  BMI and recommended wt loss of 10% of body weight over the next 6 months using a low glycemic index diet and regular exercise a minimum of 5 days per week.     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 Has been having foot pain since May 2022.     CKD (chronic kidney disease) stage 3, GFR 30-59 ml/min 04/13/2018   Nephrology referral recommended.      Closed fracture of distal lateral malleolus of left ankle 12/28/2017   Esophageal dysphagia 12/11/2019   Essential hypertension  04/10/2010   Gastro-esophageal reflux disease without esophagitis 11/19/2013   Generalized anxiety disorder 11/19/2013   History of COVID-19 05/12/2021   Hypercoagulable state due to paroxysmal atrial fibrillation 09/21/2022   Hyperlipidemia LDL goal <100 12/10/2019   Incisional hernia, without obstruction or gangrene 09/28/2014   Increased frequency of urination 02/26/2022   Major depressive disorder 06/05/2016   Melanoma 11/02/2010   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   Nocturia 12/09/2016   OSA (obstructive sleep apnea) 05/13/2019   She has moderate to severe sleep apnea and a cpap titration study is needed,  Which I have ordered       AHI was 28.6/hr and RDI was 28.6/hr .  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 10/21/11     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     Current Outpatient Rx  Order #: [id number] Class: Normal   Order #: [id number] Class: Normal   Order #: [id number] Class: Normal   Order #: [id number] Class: Normal   Order #: [id number] Class: Historical Med   Order #: [id number] Class: Normal   Order #: [id number] Class: Normal   Order #: [id number] Class: Historical Med   Order #: [id number] Class: Historical Med   Order #: [id number] Class: Normal   Order #: [id number] Class: Normal   Order #: [id number] Class: Normal   Order #: [id number] Class: Normal    Past Surgical History:  Procedure Laterality Date   ANKLE FRACTURE SURGERY Left    APPENDECTOMY  2010    ATRIAL FIBRILLATION ABLATION N/A 09/09/2022   Procedure: ATRIAL FIBRILLATION ABLATION;  Surgeon: Cindie Ole DASEN, MD;  Location: [facility];  Service: Cardiovascular;  Laterality: N/A;   BACK SURGERY     disectomy lumbar, scar tissue   BREAST CYST EXCISION Right 1970   axilla   buninectomy Bilateral    CATARACT EXTRACTION W/PHACO Left 04/12/2018   Procedure: CATARACT EXTRACTION PHACO AND INTRAOCULAR LENS PLACEMENT (IOC)  LEFT TORIC LENS;  Surgeon: Mittie Gaskin, MD;  Location: [facility];  Service: Ophthalmology;  Laterality: Left;   CATARACT EXTRACTION W/PHACO Right 05/10/2018   Procedure: CATARACT EXTRACTION PHACO AND INTRAOCULAR LENS PLACEMENT (IOC)  RIGHT TORIC LENS;  Surgeon: Mittie Gaskin, MD;  Location: [facility];  Service: Ophthalmology;  Laterality: Right;  DIABETIC   CHOLECYSTECTOMY N/A 07/05/2017   Procedure: LAPAROSCOPIC CHOLECYSTECTOMY WITH INTRAOPERATIVE CHOLANGIOGRAM;  Surgeon: Dessa Reyes ORN, MD;  Location: [facility];  Service: General;  Laterality: N/A;   COLONOSCOPY     2009 and color gaurd in 2018   COLONOSCOPY N/A 03/25/2020   Procedure: COLONOSCOPY;  Surgeon: Maryruth Ole DASEN, MD;  Location: [facility];  Service: Endoscopy;  Laterality: N/A;   DRUG INDUCED ENDOSCOPY N/A 05/06/2021   Procedure: DRUG INDUCED SLEEP ENDOSCOPY;  Surgeon: Carlie Clark, MD;  Location: [facility];  Service: ENT;  Laterality: N/A;   ERCP N/A 07/12/2017   Procedure: ENDOSCOPIC RETROGRADE CHOLANGIOPANCREATOGRAPHY (ERCP);  Surgeon: Jinny Carmine, MD;  Location[facility];  Service: Endoscopy;  Laterality: N/A;   ERCP N/A 10/04/2017   Procedure: ENDOSCOPIC RETROGRADE CHOLANGIOPANCREATOGRAPHY (ERCP);  Surgeon: Jinny Carmine, MD;  Location: [facility];  Service: Endoscopy;  Laterality: N/A;   FOOT SURGERY     x 2   INCISIONAL HERNIA REPAIR N/A 12/04/2020   Procedure: VENTRAL INCISIONAL HERNIA REPAIR WITH MESH;  Surgeon:  Eletha Boas, MD;  Location: [facility];  Service: General;  Laterality: N/A;   MELANOMA EXCISION Left 2000   PALATOPLASTY N/A 04/08/2015   Procedure: PALATOPLASTY;  Surgeon: Chinita Hasten, MD;  Location: [facility];  Service: ENT;  Laterality: N/A;   TONSILLECTOMY N/A 04/08/2015   Procedure: TONSILLECTOMY;  Surgeon: Chinita Hasten, MD;  Location: [facility];  Service: ENT;  Laterality: N/A;   TUBAL LIGATION      Physical Exam   Triage Vital Signs: ED Triage Vitals  Encounter Vitals Group     BP 11/28/23 1946 (!) 140/109     Girls Systolic BP Percentile --      Girls Diastolic BP Percentile --      Boys Systolic BP Percentile --      Boys Diastolic BP Percentile --      Pulse Rate 11/28/23 1946 65     Resp 11/28/23 1946 18     Temp 11/28/23 1952 98.1 F (36.7 C)     Temp Source 11/28/23 1952 Oral     SpO2  11/28/23 1946 100 %     Weight 11/28/23 1951 142 lb (64.4 kg)     Height 11/28/23 1951 5' 2 (1.575 m)     Head Circumference --      Peak Flow --      Pain Score 11/28/23 1951 2     Pain Loc --      Pain Education --      Exclude from Growth Chart --     Most recent vital signs: Vitals:   11/28/23 2208 11/28/23 2300  BP: (!) 199/87 (!) 188/94  Pulse: 61 61  Resp: 16 16  Temp:  98.2 F (36.8 C)  SpO2: 100% 100%    General: Awake, no distress.  CV:  Good peripheral perfusion.  Regular rate rhythm Resp:  Normal effort.  Abd:  No distention.  Other:  Cranial nerves III through XII intact, no drift, normal motor and sensory function.  NIH stroke scale 0   ED Results / Procedures / Treatments   Labs (all labs ordered are listed, but only abnormal results are displayed) Labs Reviewed  PROTIME-INR - Abnormal; Notable for the following components:      Result Value   Prothrombin Time 15.5 (*)    All other components within normal limits  COMPREHENSIVE METABOLIC PANEL WITH GFR - Abnormal; Notable for the following components:   Glucose, Bld 102 (*)    Creatinine,  Ser 1.26 (*)    AST 42 (*)    Total Bilirubin 1.3 (*)    GFR, Estimated 45 (*)    All other components within normal limits  APTT  ETHANOL  CBC WITH DIFFERENTIAL/PLATELET  CBG MONITORING, ED     EKG Interpreted by me Normal sinus rhythm rate of 63.  Normal axis and intervals.  For R wave progression.  Normal ST segments and T waves   RADIOLOGY CT head interpreted by me, no intracranial hemorrhage.  Radiology report reviewed  MRI brain pending   PROCEDURES:  Procedures   MEDICATIONS ORDERED IN ED: Medications - No data to display   IMPRESSION / MDM / ASSESSMENT AND PLAN / ED COURSE  I reviewed the triage vital signs and the nursing notes.  DDx: Dehydration, electrolyte derangement, anemia, orthostatic hypotension, CVA  Patient's presentation is most consistent with acute presentation with potential threat to life or bodily function.  Patient presenting with 45-minute episode of word finding difficulty and hand paresthesia.  Asymptomatic in the ED.  With her stroke risk factors, will obtain MRI.  If negative for acute stroke I think she can be discharged home since she is already on Eliquis .  She will continue following up with her cardiologist and primary care doctor for ongoing blood pressure regimen adjustment due to highly variable blood pressures with recent changes.       FINAL CLINICAL IMPRESSION(S) / ED DIAGNOSES   Final diagnoses:  Dizziness  Chronic atrial fibrillation (HCC)  Uncontrolled hypertension     Rx / DC Orders   ED Discharge Orders     None        Note:  This document was prepared using Dragon voice recognition software and may include unintentional dictation errors.   Viviann Pastor, MD 11/29/23 0000

## 2023-11-28 NOTE — ED Notes (Signed)
 ED Provider at bedside.

## 2023-11-28 NOTE — ED Triage Notes (Signed)
 Pt reports around 1900 today she developed difficulty finding words that she was trying to say and began to have some right arm numbness. Pt states currently all symptoms have resolved but she is having a headache. Last known well 1800. Pt reports over the past few days having difficulty managing her BP. Speech clear.

## 2023-11-28 NOTE — ED Provider Notes (Signed)
-----------------------------------------   11:05 PM on 11/28/2023 -----------------------------------------  Assuming care from Dr. Viviann.  In short, Kathy Long is a 73 y.o. female with a chief complaint of CVA/TIA-like symptoms.  Refer to the original H&P for additional details.  The current plan of care is to follow up on MRI and reassess.   Clinical Course as of 11/29/23 0112  Tue Nov 29, 2023  0109 MR BRAIN WO CONTRAST I viewed and interpreted the patient's MRI and I see no evidence of ischemia.  I also read the radiologist's report, which confirmed no acute findings.  I reassessed the patient and she is continue to have no additional neurological symptoms.  I considered hospitalization and I talked with her about hospitalization, but we agree that that would have little benefit particularly given that she is already on Eliquis .  She would rather follow-up with her primary care doctor and I think that is reasonable and appropriate.  I gave my usual and customary return precautions. [CF]    Clinical Course User Index [CF] Gordan Huxley, MD     Medications - No data to display   ED Discharge Orders     None      Final diagnoses:  Dizziness  Chronic atrial fibrillation (HCC)  Uncontrolled hypertension  Transient alteration of awareness     Gordan Huxley, MD 11/29/23 [phone]

## 2023-11-28 NOTE — ED Notes (Signed)
 Per MD Viviann NO code stroke at this time.

## 2023-11-29 ENCOUNTER — Other Ambulatory Visit: Payer: Self-pay | Admitting: Internal Medicine

## 2023-11-29 ENCOUNTER — Emergency Department

## 2023-11-29 DIAGNOSIS — I6782 Cerebral ischemia: Secondary | ICD-10-CM | POA: Diagnosis not present

## 2023-11-29 DIAGNOSIS — G459 Transient cerebral ischemic attack, unspecified: Secondary | ICD-10-CM | POA: Diagnosis not present

## 2023-11-29 NOTE — Discharge Instructions (Addendum)
 Your workup in the Emergency Department today was reassuring.  We did not find any specific abnormalities.  We recommend you drink plenty of fluids, take your regular medications and/or any new ones prescribed today, and follow up with the doctor(s) listed in these documents as recommended.  You can also continue to work with your primary care doctor regarding your blood pressure control, which we understand has been a challenge  Return to the Emergency Department if you develop new or worsening symptoms that concern you.

## 2023-11-30 NOTE — Telephone Encounter (Signed)
 Refilled: 09/08/2023 Last OV: 10/19/2023 Next OV: 12/07/2023

## 2023-12-07 ENCOUNTER — Encounter: Payer: Self-pay | Admitting: Internal Medicine

## 2023-12-07 ENCOUNTER — Ambulatory Visit: Admitting: Internal Medicine

## 2023-12-07 VITALS — BP 112/68 | HR 72 | Ht 62.0 in | Wt 146.6 lb

## 2023-12-07 DIAGNOSIS — F411 Generalized anxiety disorder: Secondary | ICD-10-CM

## 2023-12-07 DIAGNOSIS — D333 Benign neoplasm of cranial nerves: Secondary | ICD-10-CM | POA: Diagnosis not present

## 2023-12-07 DIAGNOSIS — G459 Transient cerebral ischemic attack, unspecified: Secondary | ICD-10-CM | POA: Diagnosis not present

## 2023-12-07 DIAGNOSIS — Z8616 Personal history of COVID-19: Secondary | ICD-10-CM

## 2023-12-07 DIAGNOSIS — I10 Essential (primary) hypertension: Secondary | ICD-10-CM

## 2023-12-07 MED ORDER — SERTRALINE HCL 100 MG PO TABS: 100.0000 mg | ORAL_TABLET | Freq: Every day | ORAL | 1 refills | Status: DC

## 2023-12-07 NOTE — Assessment & Plan Note (Signed)
 Changin citalopram  to sertraline  100 mg daily as a trial

## 2023-12-07 NOTE — Assessment & Plan Note (Signed)
 She has reduced bmi to 28 with use of zepbound 

## 2023-12-07 NOTE — Patient Instructions (Addendum)
 For one week  take amlodipine  2.5 mg twice daily and measure  your  BP in morning before you take your morning dose of amlodipine   Changing citalopram  to sertraline  which may help your anxiety .  No titration needed;  just stop the citalopram  and start the sertraline    Send me the BP readings in one week   .[email or web address]

## 2023-12-07 NOTE — Progress Notes (Signed)
 Subjective:  Patient ID: Kathy Long, female    DOB: [birth year]  Age: 73 y.o. MRN: [id number]  CC: The primary encounter diagnosis was History of COVID-19. Diagnoses of Acoustic neuroma (HCC), Essential hypertension, Generalized anxiety disorder, and TIA (transient ischemic attack) were also pertinent to this visit.   HPI Kathy Long presents for  Chief Complaint  Patient presents with   Medical Management of Chronic Issues    Follow up on blood pressure    JUNE 30 TIA:  TAKEN TO ER FOR SUDDEN ONSET CONFUSION,  TRANSIENT NUMBNESS OF RIGHT HAND WHICH OCCURRED ON JUNE 30. N THE SETTING OF ELEVATED BLOOD PRESSURE .    ER VISIT REVIEWED  HEAD CT, EKG MRI BRAN NEGATIVE,   FOR ACUTE CHANGES BUT MRI NOTED Moderate to advanced patchy T2/FLAIR hyperintensities in the white matter, compatible with chronic microvascular ischemic disease. AND BP WAS ELEVATED TO 188/94  Since then she has been taking amlodipine  2.5 mg twice daily per Dr Tresia advice and adding 1.25 mg amlodipine  in the morning  for readings > 140/90.  Current regimen is 2,5 mg bid,  metoprolol  25 mg daily,  and crestor  10 mg daily   Has OSA managed with mouth guard prescribed by Melba . Repeat sleep study has been done, with mouthguard in place   AHI was 2.7 to 4.3  She feels that her  anxiety   is contributing to her elevated blood pressure, because she feels powerless to change  he current political events.  She is finding herself at odds with her children and many of her  fellow church goers because she is not a Trump supporter.  she has sought counselling and has gone to several  political rallies which transiently resolve her anxiety and feeling of impotence.      Outpatient Medications Prior to Visit  Medication Sig Dispense Refill   ALPRAZolam  (XANAX ) 0.25 MG tablet TAKE ONE TABLET BY MOUTH AT BEDTIME 30 tablet 2   amLODipine  (NORVASC ) 2.5 MG tablet Take 1 tablet (2.5 mg total) by mouth 2 (two) times daily. 180  tablet 3   apixaban  (ELIQUIS ) 5 MG TABS tablet TAKE 1 TABLET BY MOUTH 2 TIMES DAILY. 180 tablet 1   benzonatate  (TESSALON ) 100 MG capsule TAKE 2 CAPSULES BY MOUTH 3 TIMES DAILY AS NEEDED FOR COUGH. 30 capsule 0   Cholecalciferol  125 MCG (5000 UT) TABS Take 5,000 Units by mouth in the morning.     clobetasol  (TEMOVATE ) 0.05 % external solution APPLY DAILY UNTIL RASH IS CLEAR 50 mL 0   Coenzyme Q10 100 MG capsule Take 100 mg by mouth daily.     Famotidine (ACID CONTROLLER PO) Take by mouth daily.     metoprolol  succinate (TOPROL -XL) 25 MG 24 hr tablet Take 1 tablet (25 mg total) by mouth daily. Take with or immediately following a meal. 90 tablet 3   ondansetron  (ZOFRAN ) 4 MG tablet TAKE 1 TABLET BY MOUTH EVERY 8 HOURS AS NEEDED FOR NAUSEA OR VOMITING 20 tablet 0   rosuvastatin  (CRESTOR ) 10 MG tablet Take 1 tablet (10 mg total) by mouth daily. 90 tablet 1   tirzepatide  (ZEPBOUND ) 5 MG/0.5ML Pen Inject 5 mg into the skin once a week. 3 mL 0   citalopram  (CELEXA ) 20 MG tablet TAKE 1 TABLET BY MOUTH DAILY 90 tablet 1   No facility-administered medications prior to visit.    Review of Systems;  Patient denies headache, fevers, malaise, unintentional weight loss, skin rash, eye pain, sinus  congestion and sinus pain, sore throat, dysphagia,  hemoptysis , cough, dyspnea, wheezing, chest pain, palpitations, orthopnea, edema, abdominal pain, nausea, melena, diarrhea, constipation, flank pain, dysuria, hematuria, urinary  Frequency, nocturia, numbness, tingling, seizures,  Focal weakness, Loss of consciousness,  Tremor, insomnia, depression, anxiety, and suicidal ideation.      Objective:  BP 112/68   Pulse 72   Ht 5' 2 (1.575 m)   Wt 146 lb 9.6 oz (66.5 kg)   SpO2 99%   BMI 26.81 kg/m   BP Readings from Last 3 Encounters:  12/07/23 112/68  11/29/23 (!) 175/93  11/21/23 (!) 140/86    Wt Readings from Last 3 Encounters:  12/07/23 146 lb 9.6 oz (66.5 kg)  11/28/23 142 lb (64.4 kg)   11/21/23 148 lb 4 oz (67.2 kg)    Physical Exam Vitals reviewed.  Constitutional:      General: She is not in acute distress.    Appearance: Normal appearance. She is normal weight. She is not ill-appearing, toxic-appearing or diaphoretic.  HENT:     Head: Normocephalic.  Eyes:     General: No scleral icterus.       Right eye: No discharge.        Left eye: No discharge.     Conjunctiva/sclera: Conjunctivae normal.  Cardiovascular:     Rate and Rhythm: Normal rate and regular rhythm.     Heart sounds: Normal heart sounds.  Pulmonary:     Effort: Pulmonary effort is normal. No respiratory distress.     Breath sounds: Normal breath sounds.  Musculoskeletal:        General: Normal range of motion.  Skin:    General: Skin is warm and dry.  Neurological:     General: No focal deficit present.     Mental Status: She is alert and oriented to person, place, and time. Mental status is at baseline.  Psychiatric:        Mood and Affect: Mood normal.        Behavior: Behavior normal.        Thought Content: Thought content normal.        Judgment: Judgment normal.     Lab Results  Component Value Date   HGBA1C 5.4 10/09/2021   HGBA1C 5.0 03/10/2021   HGBA1C 5.8 02/28/2019    Lab Results  Component Value Date   CREATININE 1.26 (H) 11/28/2023   CREATININE 0.96 10/18/2023   CREATININE 1.16 07/27/2023    Lab Results  Component Value Date   WBC 6.7 11/28/2023   HGB 14.5 11/28/2023   HCT 42.5 11/28/2023   PLT 241 11/28/2023   GLUCOSE 102 (H) 11/28/2023   CHOL 168 07/27/2023   TRIG 173.0 (H) 07/27/2023   HDL 48.70 07/27/2023   LDLDIRECT 95.0 07/27/2023   LDLCALC 84 07/27/2023   ALT 43 11/28/2023   AST 42 (H) 11/28/2023   NA 142 11/28/2023   K 4.0 11/28/2023   CL 108 11/28/2023   CREATININE 1.26 (H) 11/28/2023   BUN 21 11/28/2023   CO2 26 11/28/2023   TSH 0.84 07/27/2023   INR 1.2 11/28/2023   HGBA1C 5.4 10/09/2021   MICROALBUR 1.0 07/27/2023    MR BRAIN WO  CONTRAST Result Date: 11/29/2023 CLINICAL DATA:  Transient ischemic attack (TIA) EXAM: MRI HEAD WITHOUT CONTRAST TECHNIQUE: Multiplanar, multiecho pulse sequences of the brain and surrounding structures were obtained without intravenous contrast. COMPARISON:  CT head November 28, 2023.  MRI head July 06, 2022. FINDINGS: Brain: No acute  infarction, hemorrhage, hydrocephalus, extra-axial collection or mass lesion. Moderate to advanced patchy T2/FLAIR hyperintensities in the white matter, compatible with chronic microvascular ischemic disease. Vascular: Normal flow voids. Skull and upper cervical spine: Normal marrow signal. Sinuses/Orbits: Negative. IMPRESSION: 1. No evidence of acute intracranial abnormality. 2. Moderate to advanced chronic microvascular ischemic disease. Electronically Signed   By: Gilmore GORMAN Molt M.D.   On: 11/29/2023 00:59   CT Head Wo Contrast Result Date: 11/28/2023 CLINICAL DATA:  Aphasia.  Numbness EXAM: CT HEAD WITHOUT CONTRAST TECHNIQUE: Contiguous axial images were obtained from the base of the skull through the vertex without intravenous contrast. RADIATION DOSE REDUCTION: This exam was performed according to the departmental dose-optimization program which includes automated exposure control, adjustment of the mA and/or kV according to patient size and/or use of iterative reconstruction technique. COMPARISON:  None Available. FINDINGS: Brain: No acute intracranial hemorrhage. No focal mass lesion. No CT evidence of acute infarction. No midline shift or mass effect. No hydrocephalus. Basilar cisterns are patent. There are periventricular and subcortical white matter hypodensities. Generalized cortical atrophy. Vascular: No hyperdense vessel or unexpected calcification. Skull: Normal. Negative for fracture or focal lesion. Sinuses/Orbits: Paranasal sinuses and mastoid air cells are clear. Orbits are clear. Other: None. IMPRESSION: 1. No acute intracranial findings. 2. Atrophy and white  matter microvascular disease. Electronically Signed   By: Jackquline Boxer M.D.   On: 11/28/2023 20:23    Assessment & Plan:  .History of COVID-19 Assessment & Plan:  She has reduced bmi to 28 with use of zepbound     Acoustic neuroma [facility])  Essential hypertension Assessment & Plan: Recent elevations seem to be sparked by anxiety and frequent BP checks.  .  OSA is controlled with oral device per communication with her dentist Adam).  Encouraged to stop frequent BP chhecks.  To limit readings to once daily in the morning and follow a regimen of amlodipine  25 mg bid and metoprolol  succinate 25 mg daily.  Will adjust medications after follow up readings are submitted and repeat GFR is assessed.  She has no proteinuria by Feb 2025 evaluation but will consider  adding ARB   Orders: -     Comprehensive metabolic panel with GFR; Future -     Lipid Panel w/reflex Direct LDL; Future  Generalized anxiety disorder Assessment & Plan: Changin citalopram  to sertraline  100 mg daily as a trial    TIA (transient ischemic attack) Assessment & Plan: Occurred on Jun 30.  Continue Eliquis , rosuvastatin  . And BP control .  Will repeat lipid panel and adjust for LDL goal of 70 or less  Lab Results  Component Value Date   CHOL 168 07/27/2023   HDL 48.70 07/27/2023   LDLCALC 84 07/27/2023   LDLDIRECT 95.0 07/27/2023   TRIG 173.0 (H) 07/27/2023   CHOLHDL 3 07/27/2023      Other orders -     Sertraline  HCl; Take 1 tablet (100 mg total) by mouth daily.  Dispense: 90 tablet; Refill: 1     I spent 44 minutes on the day of this face to face encounter reviewing patient's  most recent ER visit. Most recent visit with cardiology,   prior relevant  labs and imaging studies, counseling on weight management,  counselling about anxiety management, reviewing the assessment and plan with patient, and post visit ordering and reviewing of  diagnostics and therapeutics with patient  .   Follow-up: No  follow-ups on file.   Verneita LITTIE Kettering, MD

## 2023-12-07 NOTE — Assessment & Plan Note (Addendum)
 Recent elevations seem to be sparked by anxiety and frequent BP checks.  .  OSA is controlled with oral device per communication with her dentist Adam).  Encouraged to stop frequent BP chhecks.  To limit readings to once daily in the morning and follow a regimen of amlodipine  25 mg bid and metoprolol  succinate 25 mg daily.  Will adjust medications after follow up readings are submitted and repeat GFR is assessed.  She has no proteinuria by Feb 2025 evaluation but will consider  adding ARB

## 2023-12-07 NOTE — Assessment & Plan Note (Addendum)
 Occurred on Jun 30.  Continue Eliquis , rosuvastatin  . And BP control .  Will repeat lipid panel and adjust for LDL goal of 70 or less  Lab Results  Component Value Date   CHOL 168 07/27/2023   HDL 48.70 07/27/2023   LDLCALC 84 07/27/2023   LDLDIRECT 95.0 07/27/2023   TRIG 173.0 (H) 07/27/2023   CHOLHDL 3 07/27/2023

## 2023-12-08 ENCOUNTER — Ambulatory Visit
Admission: RE | Admit: 2023-12-08 | Discharge: 2023-12-08 | Disposition: A | Discharge status: Home / Self Care | Source: Ambulatory Visit | Attending: Internal Medicine | Admitting: Internal Medicine

## 2023-12-08 DIAGNOSIS — Z1231 Encounter for screening mammogram for malignant neoplasm of breast: Secondary | ICD-10-CM | POA: Diagnosis not present

## 2023-12-13 ENCOUNTER — Encounter: Payer: Self-pay | Admitting: Internal Medicine

## 2023-12-14 ENCOUNTER — Other Ambulatory Visit: Payer: Self-pay

## 2023-12-14 ENCOUNTER — Other Ambulatory Visit (HOSPITAL_COMMUNITY): Payer: Self-pay

## 2023-12-14 ENCOUNTER — Telehealth: Payer: Self-pay

## 2023-12-14 DIAGNOSIS — G4733 Obstructive sleep apnea (adult) (pediatric): Secondary | ICD-10-CM

## 2023-12-14 MED ORDER — ZEPBOUND 5 MG/0.5ML ~~LOC~~ SOAJ: 5.0000 mg | SUBCUTANEOUS | 0 refills | Status: DC

## 2023-12-14 NOTE — Telephone Encounter (Signed)
 Pharmacy Patient Advocate Encounter   Received notification from Patient Advice Request messages that prior authorization for Zepbound  5MG /0.5ML pen-injectors is required/requested.   Insurance verification completed.   The patient is insured through [employer] .   Per test claim: PA required and submitted KEY/EOC/Request #: [id number] due to: Authorization already on file for this request. Authorization starting on 06/01/2023 and ending on 05/30/2024.  Per test claim: The current 28 day co-pay is, $0.  No PA needed at this time. This test claim was processed through [facility]- copay amounts may vary at other pharmacies due to pharmacy/plan contracts, or as the patient moves through the different stages of their insurance plan.

## 2023-12-15 ENCOUNTER — Other Ambulatory Visit: Payer: Self-pay

## 2023-12-15 DIAGNOSIS — G4733 Obstructive sleep apnea (adult) (pediatric): Secondary | ICD-10-CM

## 2023-12-15 MED ORDER — ZEPBOUND 5 MG/0.5ML ~~LOC~~ SOAJ: 5.0000 mg | SUBCUTANEOUS | 5 refills | Status: DC

## 2023-12-18 ENCOUNTER — Encounter: Payer: Self-pay | Admitting: Internal Medicine

## 2023-12-23 ENCOUNTER — Encounter: Payer: Self-pay | Admitting: Pharmacist

## 2023-12-23 NOTE — Progress Notes (Signed)
 Pharmacy Quality Measure Review  This patient is appearing on a report for being at risk of failing the adherence measure for cholesterol (statin) medications this calendar year.   Medication: rosuvastatin  10 mg Last fill date: 07/21/23 for 90 day supply  Insurance report was not up to date. No action needed at this time.  Medication refilled as of 10/18/23 x90 ds.   No refills remaining.

## 2023-12-27 ENCOUNTER — Encounter: Payer: Self-pay | Admitting: Internal Medicine

## 2023-12-27 MED ORDER — CITALOPRAM HYDROBROMIDE 20 MG PO TABS: ORAL_TABLET | ORAL | 1 refills | Status: DC

## 2024-01-06 ENCOUNTER — Ambulatory Visit: Admitting: Psychiatry

## 2024-01-06 DIAGNOSIS — F411 Generalized anxiety disorder: Secondary | ICD-10-CM

## 2024-01-06 NOTE — Progress Notes (Signed)
 [facility] Counselor/Therapist Progress Note  Patient ID: Kathy Long, MRN: [id number],    Date: 01/06/2024  Time Spent: 55 minutes   Treatment Type: Individual Therapy  Reported Symptoms:  Anxiety, stressed, some tearfulness, frustration, anger, needing to set healthier limits with some political involvement as it is negatively impacting her anxiety and stress level    Mental Status Exam:  Appearance:   Casual and Neat     Behavior:  Appropriate and Motivated  Motor:  Normal  Speech/Language:   Clear and Coherent  Affect:  anxiety  Mood:  anxious  Thought process:  goal directed  Thought content:    WNL  Sensory/Perceptual disturbances:    WNL  Orientation:  oriented to person, place, time/date, situation, day of week, month of year, year, and stated date of Aug. 8, 2025  Attention:  Good  Concentration:  Good  Memory:  WNL  Fund of knowledge:   Good  Insight:    Good  Judgment:   Good  Impulse Control:  Good and Fair   Risk Assessment: Danger to Self:  No Self-injurious Behavior: No Danger to Others: No Duty to Warn:no Physical Aggression / Violence:No  Access to Firearms a concern: No  Gang Involvement:No   Subjective:  Patient today very upset, angry, and frustrated and concerned about politics and fearing really bad things for our country. Has been watching lots of TV programming that is politically motivated and has been upsetting me a lot and had some physical symptoms recently but doctor said it was more my stress and that I was physically ok.  Stressed and frustrated in some of her relationships because she feels that her friends are not as committed to needed political change as patient is.  Husband lives in the past too much and tends to see the worst in situations. Working today on venting and also identifying and starting to change negative habits and how to set better limits for herself, which we discussed in detail in session today, giving her  clear examples to be able to follow.  Needing to curb her watching so much political information on TV, decrease her involvement with certain political action groups, and spend more time in pleasurable activities and with other people, and emphasizes with patient to which she agreed.  She did seem motivated and willing to work on behavior changes as well as changes in her thinking as discussed in session today.  Interventions: Cognitive Behavioral Therapy, Solution-Oriented/Positive Psychology, and Ego-Supportive 1.Reduce overall level, frequency, and intensity of the anxiety so that daily functioning is not impaired. 2.Increase understanding of beliefs and messages that produce the worry and anxiety. 3.Explore cognitive messages that mediate anxiety response and retrain in adaptive cognitions.   Diagnosis:   ICD-10-CM   1. Generalized anxiety disorder  F41.1      Plan:  Patient today in session continuing her work on her symptoms of anxiety, over-stressed, some sadness, and frustration with other people.  Her participation in session today was quite good after she vented a lot regarding her political activity and meetings.  I expressed my concern to her about how some much of her political involvement is impacting her mood and patient seemed to recognize some of this herself but hard to admit.  Was very engaged in session and realizes changing some of her behaviors and things that she is involved in could truly be helpful however it difficult for her due to being so involved in certain groups.  Did agree and seemed very sincere at working on some of the changes we discussed in session that would represent a cutting back of some of her involvement in certain politically motivated activities, and also work towards changing her mindset and realizing that she may be able to change more than she feels she can change right now.  Very impacted by political groups with the problem being the negative impact and  the strength of it and how it impacts her mental health.  Worked well in session although some denial of the influence of certain things in her life but still showed some motivation and a willingness to actually work on some homework in between sessions.  She did want to return within 2 weeks with which I agreed and she also wanted to schedule several in advance, which I believed was due to some shift eventually and her understanding and her realizing she needs help with this.  (Not all details included in this note due to patient privacy needs).  This patient, Kathy Long, showed good interaction today in session and eventual motivation and working on goal-directed behaviors as she states that she does want to move in a more healthy direction moving forward into the future.  Goal review and progress/challenges noted with patient.  Next appointment within 2 to 3 weeks.   Barnie Bunde, LCSW

## 2024-01-24 ENCOUNTER — Ambulatory Visit: Admitting: Psychiatry

## 2024-01-24 DIAGNOSIS — F411 Generalized anxiety disorder: Secondary | ICD-10-CM

## 2024-01-24 NOTE — Progress Notes (Signed)
 [facility] Counselor/Therapist Progress Note  Patient ID: Kathy Long, MRN: [id number],    Date: 01/24/2024  Time Spent: 55 minutes   Treatment Type: Individual Therapy  Reported Symptoms: anxiety, some depression early a.m., stressed, tearfulness at times, anger, frustration, continues to need to set healthier limits with political involvement at it negatively impacts her anxiety and stress level; sadness   Mental Status Exam:  Appearance:   Neat and Well Groomed     Behavior:  Appropriate, Sharing, and Motivated  Motor:  Normal  Speech/Language:   Clear and Coherent  Affect:  Depressed and Tearful  Mood:  anxious, depressed, and sad  Thought process:  goal directed  Thought content:    Rumination  Sensory/Perceptual disturbances:    WNL  Orientation:  oriented to person, place, time/date, situation, day of week, month of year, year, and stated date of Aug. 26,   Attention:  Good  Concentration:  Good  Memory:  WNL  Fund of knowledge:   Good  Insight:    Good  Judgment:   Good  Impulse Control:  Good   Risk Assessment: Danger to Self:  No Self-injurious Behavior: No Danger to Others: No Duty to Warn:no Physical Aggression / Violence: No Access to Firearms a concern: No  Gang Involvement:No   Subjective:   Patient in today sharing and processing some of her sadness, depression, and emotions re: first grandson to leave for college. Also her depression, sadness and emotions re: politics and fearing bad things for the [place] . Worked on her fearful and negative thoughts in more detail today. Trying to watch less TV that is politically motivated and upsets patient a lot.  Reports she has been reading some instead of watching so much political TV. Trying to re-focus at times on  the positives and what can be helpful to patient and her mood.  To intentionally not watch as much political programming.  To intentionally make contact with friends that are healthy for her.  To  intentionally get outside some each day and spend time with some of her friends who are not heavy into politics and are healthy for patient.  Does want to feel better and is starting to wonder if she may have some depression on board and is to see a med provider within the next few weeks to be evaluated.   Interventions: Cognitive Behavioral Therapy, Solution-Oriented/Positive Psychology, and Ego-Supportive 1.Reduce overall level, frequency, and intensity of the anxiety so that daily functioning is not impaired. 2.Increase understanding of beliefs and messages that produce the worry and anxiety. 3.Explore cognitive messages that mediate anxiety response and retrain in adaptive cognitions.   Diagnosis:   ICD-10-CM   1. Generalized anxiety disorder  F41.1      Plan:   Patient today working well in session on her anxiety, over-stressed, frustration, sadness, and her tendency to tune into a lot of political programming that exacerbates her symptoms.  Hard for her at times to think about other things other than the political scene in the [place]  and some beyond.  Does seem to be more motivated and trying to follow through in goal-directed behaviors.  Is wanting to see a med provider to be evaluated to see if I need any medication to help with what feels like depression and I agree this would be an appropriate step for patient especially based on appointment today.  She was more tearful but is also has some things going on in the family that contributed  to some of that, however feel that it is a good idea to at least be evaluated regarding possible medication due to some depressed feelings, but no thoughts to harm herself or others.  Continues to have good supportive family interactions and very involved at her church which is also supportive.  She continues to be very impacted by political groups and states that she is working towards changing her mindset and not be overly caught up in politics as that has not  been helpful for her more recently.  She did work well in session today and showed good motivation and a willingness to see further how being overly involved in the current political scene is not helpful for her and impacts her and her mental health in negative ways.  Less denial about the influence of politics in her life and showing better motivation and willingness to make some changes in terms of how involved she stays and political activities or discussions, and shares that on some occasions she does find herself hoping for some significant changes now and into the future although also realizing her need to have some healthy boundaries in all of this.  Worked well in session today and seems to feel a little more encouraged within herself and able to continue working on goal-directed behaviors with the goal being to move in a healthier direction now and into the future.  Goal review and progress/challenges noted with patient.  Next appointment within 2 to 3 weeks.   Barnie Bunde, LCSW

## 2024-01-25 DIAGNOSIS — H903 Sensorineural hearing loss, bilateral: Secondary | ICD-10-CM | POA: Diagnosis not present

## 2024-01-26 DIAGNOSIS — D0362 Melanoma in situ of left upper limb, including shoulder: Secondary | ICD-10-CM | POA: Diagnosis not present

## 2024-02-04 ENCOUNTER — Ambulatory Visit

## 2024-02-04 ENCOUNTER — Ambulatory Visit: Admission: EM | Admit: 2024-02-04 | Discharge: 2024-02-04 | Disposition: A | Discharge status: Home / Self Care

## 2024-02-04 DIAGNOSIS — R110 Nausea: Secondary | ICD-10-CM | POA: Diagnosis not present

## 2024-02-04 DIAGNOSIS — R42 Dizziness and giddiness: Secondary | ICD-10-CM

## 2024-02-04 MED ORDER — ONDANSETRON 4 MG PO TBDP: 4.0000 mg | ORAL_TABLET | Freq: Three times a day (TID) | ORAL | 0 refills | Status: DC | PRN

## 2024-02-04 NOTE — ED Provider Notes (Signed)
 [facility]    CSN: [id number] Arrival date & time: 02/04/24  9188      History   Chief Complaint Chief Complaint  Patient presents with   Dizziness   Nausea    HPI Kathy Long is a 73 y.o. female.  Patient presents with dizziness and lightheadedness x 3 days.  She woke up on 02/02/2024 with her symptoms.  At that time it felt like the room was spinning slightly.  She had associated nausea.  She took Zofran  for the nausea.  The sensation of room spinning resolved with residual lightheadedness and feeling off balance.  She took meclizine without relief.  She is concerned for possible ear infection.  She denies focal weakness, numbness, chest pain, heart palpitations, shortness of breath, vomiting.  The history is provided by the patient and medical records.    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 There is tumor extending from  the cerebellar pontine angle cistern to the fundus of the internal  auditory canal. No mass-effect on the brainstem.     Adiposity 09/28/2014   Body mass index is 31.45 kg/(m^2).     Last Assessment & Plan:   I have addressed  BMI and recommended wt loss of 10% of body weight over the next 6 months using a low glycemic index diet and regular exercise a minimum of 5 days per week.     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 Has been having foot pain since May 2022.     CKD (chronic kidney disease) stage 3, GFR 30-59 ml/min 04/13/2018   Nephrology referral recommended.       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 I have now received a night guard that is working very well!   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 10/21/11     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     Patient Active Problem List   Diagnosis Date Noted   TIA (transient ischemic attack) 12/07/2023   Hypercoagulable state due to paroxysmal atrial fibrillation 09/21/2022   Increased frequency of urination 02/26/2022   Paroxysmal atrial fibrillation 10/09/2021   Aortic  atherosclerosis 05/12/2021   History of COVID-19 05/12/2021   History of obesity in adulthood 03/24/2021   Esophageal dysphagia 12/11/2019   Hyperlipidemia LDL goal <100 12/10/2019   Arthritis of right acromioclavicular joint 08/15/2019   OSA (obstructive sleep apnea) 05/13/2019   CKD (chronic kidney disease) stage 3, GFR 30-59 ml/min 04/13/2018   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   S/P laparoscopic cholecystectomy 07/19/2017   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   Nocturia 12/09/2016   Major depressive disorder 06/05/2016   Generalized anxiety disorder 11/19/2013   Gastro-esophageal reflux disease without esophagitis 11/19/2013   Squamous cell carcinoma of skin of face 10/21/2011   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   Melanoma 11/02/2010   Essential hypertension 04/10/2010   Osteoporosis 04/10/2010    Past Surgical History:  Procedure Laterality Date   ANKLE FRACTURE SURGERY Left    APPENDECTOMY  08/24/2007   Led to incisional hernia which has been corrected   ATRIAL FIBRILLATION ABLATION N/A 09/09/2022   Procedure: ATRIAL FIBRILLATION ABLATION;  Surgeon: Cindie Ole DASEN, MD;  Location: [facility];  Service: Cardiovascular;  Laterality: N/A;   BACK SURGERY     disectomy lumbar, scar tissue   BREAST CYST EXCISION Right 1970   axilla   buninectomy Bilateral    CATARACT EXTRACTION W/PHACO Left 04/12/2018   Procedure: CATARACT EXTRACTION PHACO AND INTRAOCULAR LENS PLACEMENT (IOC)  LEFT TORIC LENS;  Surgeon: Mittie Gaskin, MD;  Location: [facility];  Service: Ophthalmology;  Laterality: Left;   CATARACT EXTRACTION W/PHACO Right 05/10/2018   Procedure: CATARACT EXTRACTION PHACO AND INTRAOCULAR LENS PLACEMENT (IOC)  RIGHT TORIC LENS;  Surgeon: Mittie Gaskin, MD;  Location: [facility];  Service: Ophthalmology;  Laterality: Right;  DIABETIC   CHOLECYSTECTOMY N/A 07/05/2017   Procedure:  LAPAROSCOPIC CHOLECYSTECTOMY WITH INTRAOPERATIVE CHOLANGIOGRAM;  Surgeon: Dessa Reyes ORN, MD;  Location: [facility];  Service: General;  Laterality: N/A;   COLONOSCOPY     2009 and color gaurd in 2018   COLONOSCOPY N/A 03/25/2020   Procedure: COLONOSCOPY;  Surgeon: Maryruth Ole DASEN, MD;  Location: [facility];  Service: Endoscopy;  Laterality: N/A;   DRUG INDUCED ENDOSCOPY N/A 05/06/2021   Procedure: DRUG INDUCED SLEEP ENDOSCOPY;  Surgeon: Carlie Clark, MD;  Location: [facility];  Service: ENT;  Laterality: N/A;   ERCP N/A 07/12/2017   Procedure: ENDOSCOPIC RETROGRADE CHOLANGIOPANCREATOGRAPHY (ERCP);  Surgeon: Jinny Carmine, MD;  Location: [facility];  Service: Endoscopy;  Laterality: N/A;   ERCP N/A 10/04/2017   Procedure: ENDOSCOPIC RETROGRADE CHOLANGIOPANCREATOGRAPHY (ERCP);  Surgeon: Jinny Carmine, MD;  Location: [facility];  Service: Endoscopy;  Laterality: N/A;   EYE SURGERY  2019   Cataract surgery both eyes   FOOT SURGERY     x 2   FRACTURE SURGERY  November 30, 2017   Displaced ankle from fall   HERNIA REPAIR  2022  INCISIONAL HERNIA REPAIR N/A 12/04/2020   Procedure: VENTRAL INCISIONAL HERNIA REPAIR WITH MESH;  Surgeon: Eletha Boas, MD;  Location: [facility];  Service: General;  Laterality: N/A;   MELANOMA EXCISION Left 2000   PALATOPLASTY N/A 04/08/2015   Procedure: PALATOPLASTY;  Surgeon: Chinita Hasten, MD;  Location: [facility];  Service: ENT;  Laterality: N/A;   SPINE SURGERY  2005 and 2011   Bulging disk and then scar tissue   TONSILLECTOMY N/A 04/08/2015   Procedure: TONSILLECTOMY;  Surgeon: Chinita Hasten, MD;  Location: [facility];  Service: ENT;  Laterality: N/A;   TUBAL LIGATION  1975   Birth control    OB History     Gravida  3   Para  3   Term      Preterm      AB      Living  3      SAB      IAB      Ectopic      Multiple      Live Births           Obstetric Comments  Menstrual age: 86  Age 1st Pregnancy: 47            Home Medications    Prior to Admission medications   Medication Sig Start Date End Date Taking? Authorizing Provider  [facility] MAGNESIUM GLYCINATE PO Take by mouth.   Yes [provider]  ondansetron  (ZOFRAN -ODT) 4 MG disintegrating tablet Take 1 tablet (4 mg total) by mouth every 8 (eight) hours as needed for nausea or vomiting. 02/04/24  Yes Corlis Burnard DEL, NP  ALPRAZolam  (XANAX ) 0.25 MG tablet TAKE ONE TABLET BY MOUTH AT BEDTIME 11/30/23   Marylynn Verneita CROME, MD  amLODipine  (NORVASC ) 2.5 MG tablet Take 1 tablet (2.5 mg total) by mouth 2 (two) times daily. 11/21/23   Gollan, Timothy J, MD  apixaban  (ELIQUIS ) 5 MG TABS tablet TAKE 1 TABLET BY MOUTH 2 TIMES DAILY. 10/19/23   Cindie Ole DASEN, MD  benzonatate  (TESSALON ) 100 MG capsule TAKE 2 CAPSULES BY MOUTH 3 TIMES DAILY AS NEEDED FOR COUGH. 09/09/23   Marylynn Verneita CROME, MD  Cholecalciferol  125 MCG (5000 UT) TABS Take 5,000 Units by mouth in the morning.    [provider]  citalopram  (CELEXA ) 20 MG tablet TAKE 1 TABLET BY MOUTH DAILY 12/27/23   Tullo, Teresa L, MD  clobetasol  (TEMOVATE ) 0.05 % external solution APPLY DAILY UNTIL RASH IS CLEAR 03/02/22   Marylynn Verneita CROME, MD  Coenzyme Q10 100 MG capsule Take 100 mg by mouth daily.    [provider]  Famotidine (ACID CONTROLLER PO) Take by mouth daily.    [provider]  metoprolol  succinate (TOPROL -XL) 25 MG 24 hr tablet Take 1 tablet (25 mg total) by mouth daily. Take with or immediately following a meal. 11/21/23   Gollan, Timothy J, MD  rosuvastatin  (CRESTOR ) 10 MG tablet Take 1 tablet (10 mg total) by mouth daily. 07/28/23   Marylynn Verneita CROME, MD  tirzepatide  (ZEPBOUND ) 5 MG/0.5ML Pen Inject 5 mg into the skin once a week. 12/15/23   Marylynn Verneita CROME, MD    Family History Family History  Problem Relation Age of Onset   Arthritis Mother    Depression Mother    Hypertension Mother    Obesity Mother    Stroke Mother    Early death Father    Heart disease  Father    Hypertension Father    Kidney disease Father  Obesity Father    Cancer Sister 2       ovarian ca   Breast cancer Other    Alzheimer's disease Maternal Uncle        x2   Anxiety disorder Son    Anxiety disorder Sister    Arthritis Sister    Depression Sister    Hypertension Sister    Cancer Paternal Aunt    Cancer Paternal Aunt    Hypertension Sister    Cancer Daughter     Social History Social History   Tobacco Use   Smoking status: Never   Smokeless tobacco: Never   Tobacco comments:    smoked socially in college  Vaping Use   Vaping status: Never Used  Substance Use Topics   Alcohol use: Yes    Alcohol/week: 0.0 standard drinks of alcohol    Comment: occ - may have 1 glass wine/month   Drug use: No     Allergies   Morphine  and codeine, Tramadol, and Tape   Review of Systems Review of Systems  Constitutional:  Negative for chills and fever.  HENT:  Negative for ear discharge and ear pain.   Respiratory:  Negative for cough and shortness of breath.   Cardiovascular:  Negative for chest pain and palpitations.  Gastrointestinal:  Positive for nausea. Negative for vomiting.  Neurological:  Positive for dizziness and light-headedness. Negative for syncope, facial asymmetry, speech difficulty, weakness, numbness and headaches.     Physical Exam Triage Vital Signs ED Triage Vitals  Encounter Vitals Group     BP 02/04/24 0846 135/63     Girls Systolic BP Percentile --      Girls Diastolic BP Percentile --      Boys Systolic BP Percentile --      Boys Diastolic BP Percentile --      Pulse Rate 02/04/24 0846 60     Resp 02/04/24 0846 20     Temp 02/04/24 0846 98.1 F (36.7 C)     Temp src --      SpO2 02/04/24 0846 98 %     Weight --      Height --      Head Circumference --      Peak Flow --      Pain Score 02/04/24 0841 0     Pain Loc --      Pain Education --      Exclude from Growth Chart --    No data found.  Updated Vital  Signs BP 135/63   Pulse 60   Temp 98.1 F (36.7 C)   Resp 20   SpO2 98%   Visual Acuity Right Eye Distance:   Left Eye Distance:   Bilateral Distance:    Right Eye Near:   Left Eye Near:    Bilateral Near:     Physical Exam Constitutional:      General: She is not in acute distress. HENT:     Right Ear: Tympanic membrane normal.     Left Ear: Tympanic membrane normal.     Nose: Nose normal.     Mouth/Throat:     Mouth: Mucous membranes are moist.     Pharynx: Oropharynx is clear.  Cardiovascular:     Rate and Rhythm: Normal rate and regular rhythm.     Heart sounds: Normal heart sounds.  Pulmonary:     Effort: Pulmonary effort is normal. No respiratory distress.     Breath sounds: Normal breath sounds.  Neurological:  General: No focal deficit present.     Mental Status: She is alert and oriented to person, place, and time.     Sensory: No sensory deficit.     Motor: No weakness.     Gait: Gait normal.      UC Treatments / Results  Labs (all labs ordered are listed, but only abnormal results are displayed) Labs Reviewed - No data to display  EKG   Radiology No results found.  Procedures Procedures (including critical care time)  Medications Ordered in UC Medications - No data to display  Initial Impression / Assessment and Plan / UC Course  I have reviewed the triage vital signs and the nursing notes.  Pertinent labs & imaging results that were available during my care of the patient were reviewed by me and considered in my medical decision making (see chart for details).    Dizziness, nausea without vomiting.  Afebrile and vital signs are stable.  Patient declines transfer to the ED.  She declines EKG.  No ear infection noted on exam today.  Instructed her to follow-up with her PCP on Monday.  Treating nausea with Zofran .  ED precautions discussed.  Education provided on dizziness and nausea.  Patient agrees to plan of care. Final Clinical  Impressions(s) / UC Diagnoses   Final diagnoses:  Dizziness  Nausea without vomiting     Discharge Instructions      Follow up with your primary care provider tomorrow.  Go to the emergency department if you have worsening symptoms.    Take the Zofran  as directed for nausea.     ED Prescriptions     Medication Sig Dispense Auth. Provider   ondansetron  (ZOFRAN -ODT) 4 MG disintegrating tablet Take 1 tablet (4 mg total) by mouth every 8 (eight) hours as needed for nausea or vomiting. 20 tablet Corlis Burnard DEL, NP      PDMP not reviewed this encounter.   Corlis Burnard DEL, NP 02/04/24 [phone]

## 2024-02-04 NOTE — ED Triage Notes (Signed)
 Patient to Urgent Care with complaints of ear fullness and nasal congestion/ dizziness/ nausea. Denies any fevers.   Symptoms x2 days. Woke up w/ symptoms. Hx of vertigo (states this doesn't seem as bad as previous episodes). Concerned about a possible inner ear infection.   Taking meclizine (little relief)/ zofran .

## 2024-02-04 NOTE — Discharge Instructions (Addendum)
 Follow up with your primary care provider tomorrow.  Go to the emergency department if you have worsening symptoms.    Take the Zofran  as directed for nausea.

## 2024-02-08 ENCOUNTER — Ambulatory Visit: Admitting: Psychiatry

## 2024-02-08 DIAGNOSIS — F411 Generalized anxiety disorder: Secondary | ICD-10-CM | POA: Diagnosis not present

## 2024-02-08 NOTE — Progress Notes (Signed)
 [facility] Counselor/Therapist Progress Note  Patient ID: Kathy Long, MRN: [id number],    Date: 02/08/2024  Time Spent:   53 minutes  Treatment Type: Individual Therapy  Reported Symptoms: anxiety, relieved after have a mole removed on her arm, some early a.m. depression but less intense, anger, frustration, needing to continue setting boundaries with watching political programming on TV as that aggravates her anxiety and stress, some sadness   Mental Status Exam:  Appearance:   Neat     Behavior:  Appropriate, Sharing, and Motivated  Motor:  Normal  Speech/Language:   Clear and Coherent  Affect:  anxious  Mood:  anxious  Thought process:  goal directed  Thought content:    Rumination  Sensory/Perceptual disturbances:    WNL  Orientation:  oriented to person, place, time/date, situation, day of week, month of year, year, and stated date of Sept. 10, 2025  Attention:  Good  Concentration:  Good  Memory:  WNL  Fund of knowledge:   Good  Insight:    Good and Fair  Judgment:   Good  Impulse Control:  Good   Risk Assessment: Danger to Self:  No Self-injurious Behavior: No Danger to Others: No Duty to Warn:no Physical Aggression / Violence:No  Access to Firearms a concern: No  Gang Involvement:No   Subjective: Patient today working further on her anxiety, frustrations, sadness, and some anger. Struggling with personal, family sadness and and did well in talking through specific situations related  to her  anxiety, sadness, and other symptoms noted. Also looking at improving her ability to interrupt certain thought patterns that are not productive for her and choose to focus more on positive thought patterns.  Worked with this some in session today and patient is to continue between now and next session.  Less attention given to political issues and programming.  Fearful and negative thoughts have decreased some.  Intentionally getting herself involved in some other  activities other than watching political programming on TV.  Also increasing her time with other people outside the home including extended family.  Makes more intentional contact with friends that are healthy for her and spending some time each day outside.  Noticing the difference it is making for her and mentally and emotionally and knows she needs to continue with her strategies and seems very motivated in doing this.  To return in 3 weeks.  Interventions: Cognitive Behavioral Therapy, Solution-Oriented/Positive Psychology, and Ego-Supportive 1.Reduce overall level, frequency, and intensity of the anxiety so that daily functioning is not impaired. 2.Increase understanding of beliefs and messages that produce the worry and anxiety. 3.Explore cognitive messages that mediate anxiety response and retrain in adaptive cognitions.    Diagnosis:   ICD-10-CM   1. Generalized anxiety disorder  F41.1      Plan:  Patient continuing her work on feeling overstressed (which has improved some), limiting her time with negative people, and being more particular as to the TV programming she tends into specifically to decrease watching political programs.  Noticing that this is making a difference for her and is committed to continuing at this point, even when it is difficult to set limits.  Overall, less sadness and less watching of TV.  Is able to think about more things other than the political scene and the [place]  and be on.  Showing increased motivation and desire to make healthier changes.  Less tearful today.  Good supportive family interactions are helpful.  Also involvement at her  church is helpful as long as she has healthy boundaries due to varying opinions and political thoughts of others.  Worked well in session showing good motivation and more willingness to really focus on her changes and follow-through.  Much less denial about the prior influence of politics in her life and more awareness of the  difference she is starting to feel and setting limits in terms of watching excessive politics on TV or on her phone.  Working well in session and showing more calmness and encouragement as she hopes to keep moving in a healthier direction.  Goal review and progress/challenges noted with patient.  Next appointment within 2 to 3 weeks.   Barnie Bunde, LCSW

## 2024-02-09 DIAGNOSIS — D0359 Melanoma in situ of other part of trunk: Secondary | ICD-10-CM | POA: Diagnosis not present

## 2024-02-21 ENCOUNTER — Ambulatory Visit: Admitting: Psychiatry

## 2024-02-23 DIAGNOSIS — L905 Scar conditions and fibrosis of skin: Secondary | ICD-10-CM | POA: Diagnosis not present

## 2024-02-23 DIAGNOSIS — C44511 Basal cell carcinoma of skin of breast: Secondary | ICD-10-CM | POA: Diagnosis not present

## 2024-03-02 ENCOUNTER — Encounter: Payer: Self-pay | Admitting: Physician Assistant

## 2024-03-02 ENCOUNTER — Ambulatory Visit: Admitting: Physician Assistant

## 2024-03-02 VITALS — BP 129/75 | HR 60 | Ht 62.0 in | Wt 152.0 lb

## 2024-03-02 DIAGNOSIS — F331 Major depressive disorder, recurrent, moderate: Secondary | ICD-10-CM | POA: Diagnosis not present

## 2024-03-02 DIAGNOSIS — F411 Generalized anxiety disorder: Secondary | ICD-10-CM | POA: Diagnosis not present

## 2024-03-02 MED ORDER — ALPRAZOLAM 0.25 MG PO TABS: 0.2500 mg | ORAL_TABLET | Freq: Every day | ORAL | 2 refills | Status: AC

## 2024-03-02 MED ORDER — CITALOPRAM HYDROBROMIDE 20 MG PO TABS: 30.0000 mg | ORAL_TABLET | Freq: Every day | ORAL | 0 refills | Status: DC

## 2024-03-02 NOTE — Progress Notes (Signed)
 [facility] MD/PA/NP Initial Note  03/02/2024 2:22 PM Kathy Long  MRN:  [id number]  Chief Complaint:  Chief Complaint   Establish Care    HPI:   Here to discuss depression and anxiety. She has a real sense of social justice, which is very hard for her in the current political situation.  Gets a little angry with some of her friends that support President Trump and that is frustrating to her. She's gone to some protests and will continue to speak out against the things she feels that he is doing wrong.  She's been told to not watch the news so much but she feels like it's her duty to do so.  She feels sad when thinking about these things.  Not having PA but gets overwhelmed.  She takes Xanax  only at night to help her sleep.  Marval has been on Celexa  20 mg for approximately 20 years.  She does not want to do much of anything, she does go out to eat, church, things of that nature but sometimes she does not want to go.  Energy and motivation are fair to good depending on the day.  Sleeps fairly well but does have racing thoughts at times, keeping her from drifting off to sleep easily.  She takes Xanax  and magnesium which help.  She also ruminates about things.  ADLs and personal hygiene are normal.   Denies any changes in concentration, making decisions, or remembering things.  Appetite has not changed.  No mania, delirium, AH/VH.  No SI/HI.  Visit Diagnosis:    ICD-10-CM   1. Major depressive disorder, recurrent episode, moderate (HCC)  F33.1     2. Generalized anxiety disorder  F41.1      Past Psychiatric History:   Past medications for mental health diagnoses include: Celexa , Xanax , Melatonin, Valium  No suicide attempts, no psych hospitalizations  Past Medical History:  Past Medical History:  Diagnosis Date   Adiposity 09/28/2014   Body mass index is 31.45 kg/(m^2).     Last Assessment & Plan:   I have addressed  BMI and recommended wt loss of 10% of body weight over the next 6  months using a low glycemic index diet and regular exercise a minimum of 5 days per week.     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 Has been having foot pain since May 2022.     CKD (chronic kidney disease) stage 3, GFR 30-59 ml/min 04/13/2018   Nephrology referral recommended.      Closed fracture of distal lateral malleolus of left ankle 12/28/2017   Esophageal dysphagia 12/11/2019   Essential hypertension 04/10/2010   Gastro-esophageal reflux disease without esophagitis 11/19/2013   Generalized anxiety disorder 11/19/2013   History of COVID-19 05/12/2021   Hypercoagulable state due to paroxysmal atrial fibrillation 09/21/2022   Hyperlipidemia LDL goal <100 12/10/2019   Incisional hernia, without obstruction or gangrene 09/28/2014   Increased frequency of urination 02/26/2022   Major depressive disorder 06/05/2016   Melanoma 11/02/2010   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   Nocturia 12/09/2016   OSA (obstructive sleep apnea) 05/13/2019   She  has moderate to severe sleep apnea and a cpap titration study is needed,  Which I have ordered       AHI was 28.6/hr and RDI was 28.6/hr .  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 I have now received a night guard that is working very well!   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 10/21/11     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     Past Surgical History:  Procedure Laterality Date   ANKLE FRACTURE SURGERY Left    APPENDECTOMY  08/24/2007   Led to incisional hernia which has been corrected   ATRIAL FIBRILLATION ABLATION N/A 09/09/2022   Procedure: ATRIAL FIBRILLATION ABLATION;  Surgeon: Cindie Ole DASEN, MD;  Location: [facility];  Service: Cardiovascular;  Laterality: N/A;   BACK SURGERY     disectomy lumbar, scar tissue   BREAST CYST EXCISION Right 1970   axilla   buninectomy Bilateral    CATARACT EXTRACTION W/PHACO Left 04/12/2018   Procedure: CATARACT EXTRACTION PHACO AND INTRAOCULAR LENS PLACEMENT (IOC)  LEFT TORIC LENS;  Surgeon: Mittie Gaskin, MD;  Location: [facility];  Service: Ophthalmology;  Laterality: Left;   CATARACT EXTRACTION W/PHACO Right 05/10/2018   Procedure: CATARACT EXTRACTION PHACO AND INTRAOCULAR LENS PLACEMENT (IOC)  RIGHT TORIC LENS;  Surgeon: Mittie Gaskin, MD;  Location: [facility];  Service: Ophthalmology;  Laterality: Right;  DIABETIC   CHOLECYSTECTOMY N/A 07/05/2017   Procedure: LAPAROSCOPIC CHOLECYSTECTOMY WITH INTRAOPERATIVE CHOLANGIOGRAM;  Surgeon: Dessa Reyes ORN, MD;  Location: [facility];  Service: General;  Laterality: N/A;   COLONOSCOPY     2009 and color gaurd in 2018   COLONOSCOPY N/A 03/25/2020   Procedure: COLONOSCOPY;  Surgeon: Maryruth Ole DASEN, MD;  Location: [facility];  Service: Endoscopy;  Laterality: N/A;   DRUG INDUCED ENDOSCOPY N/A 05/06/2021   Procedure: DRUG INDUCED SLEEP ENDOSCOPY;  Surgeon: Carlie Clark, MD;  Location: [facility];  Service: ENT;  Laterality: N/A;   ERCP N/A 07/12/2017   Procedure: ENDOSCOPIC RETROGRADE  CHOLANGIOPANCREATOGRAPHY (ERCP);  Surgeon: Jinny Carmine, MD;  Location: [facility];  Service: Endoscopy;  Laterality: N/A;   ERCP N/A 10/04/2017   Procedure: ENDOSCOPIC RETROGRADE CHOLANGIOPANCREATOGRAPHY (ERCP);  Surgeon: Jinny Carmine, MD;  Location: [facility];  Service: Endoscopy;  Laterality: N/A;   EYE SURGERY  2019   Cataract surgery both eyes   FOOT SURGERY     x 2   FRACTURE SURGERY  November 30, 2017   Displaced ankle from fall   HERNIA REPAIR  2022   INCISIONAL HERNIA REPAIR N/A 12/04/2020   Procedure: VENTRAL INCISIONAL HERNIA REPAIR WITH MESH;  Surgeon: Eletha Boas, MD;  Location: [facility];  Service: General;  Laterality: N/A;   MELANOMA EXCISION Left 2000   PALATOPLASTY N/A 04/08/2015   Procedure: PALATOPLASTY;  Surgeon: Chinita Hasten, MD;  Location: [facility];  Service: ENT;  Laterality: N/A;   SPINE SURGERY  2005 and 2011   Bulging  disk and then scar tissue   TONSILLECTOMY N/A 04/08/2015   Procedure: TONSILLECTOMY;  Surgeon: Chinita Hasten, MD;  Location: [facility];  Service: ENT;  Laterality: N/A;   TUBAL LIGATION  1975   Birth control    Family Psychiatric History:   See below  Family History:  Family History  Problem Relation Age of Onset   Arthritis Mother    Depression Mother    Hypertension Mother    Obesity Mother    Stroke Mother    Early death Father    Heart disease Father    Hypertension Father    Kidney disease Father    Obesity Father    Cancer Sister 76       ovarian ca   Anxiety disorder Sister    Arthritis Sister    Depression Sister    Hypertension Sister    Hypertension Sister    Cancer Paternal Aunt    Cancer Paternal Aunt    Alzheimer's disease Maternal Uncle        x2   Cancer Daughter    Anxiety disorder Son    Breast cancer Other     Social History:  Social History   Socioeconomic History   Marital status: Married    Spouse name: Not on file   Number of children: Not on file   Years of education: 20   Highest  education level: Doctorate  Occupational History   Occupation: Retired  Tobacco Use   Smoking status: Never   Smokeless tobacco: Never   Tobacco comments:    smoked socially in college  Vaping Use   Vaping status: Never Used  Substance and Sexual Activity   Alcohol use: Yes    Alcohol/week: 0.0 standard drinks of alcohol    Comment: occ - may have 1 glass wine/month   Drug use: No   Sexual activity: Not on file  Other Topics Concern   Not on file  Social History Narrative   Grew up in [place], great childhood. No h/o abuse. Youngest of 4 girls.   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     Married 54 years, husband works 4 days week/4 hours day. Works at [employer].       Caffeine-1-2 coffee   No legal issues   Protestant Attends church weekly   Social Drivers of Health   Financial Resource Strain: Low Risk  (03/02/2024)   Overall Financial Resource Strain (CARDIA)    Difficulty of Paying Living Expenses: Not hard at all  Food Insecurity: No Food Insecurity (03/02/2024)   Hunger Vital Sign    Worried About Running Out of Food in the Last Year: Never true    Ran Out of Food in the Last Year: Never true  Transportation Needs: No Transportation Needs (03/02/2024)   PRAPARE - Administrator, Civil Service (Medical): No    Lack of Transportation (Non-Medical): No  Physical Activity: Insufficiently Active (12/06/2023)   Exercise Vital Sign    Days of Exercise per Week: 2 days    Minutes of Exercise per Session: 20 min  Stress: Stress Concern Present (03/02/2024)   [employer] of Occupational Health - Occupational Stress Questionnaire    Feeling of Stress: To some extent  Social Connections: Socially Integrated (03/02/2024)   Social Connection and Isolation Panel    Frequency of Communication with Friends and Family: More than three times a week    Frequency of Social Gatherings with  Friends and Family: Twice a  week    Attends Religious Services: More than 4 times per year    Active Member of Clubs or Organizations: Yes    Attends Engineer, structural: More than 4 times per year    Marital Status: Married    Allergies:  Allergies  Allergen Reactions   Morphine  And Codeine Other (See Comments)    Chest pain   Tramadol Other (See Comments)    tremors   Tape Rash     Surgical tape    Metabolic Disorder Labs: Lab Results  Component Value Date   HGBA1C 5.4 10/09/2021   MPG 108 10/09/2021   No results found for: PROLACTIN Lab Results  Component Value Date   CHOL 168 07/27/2023   TRIG 173.0 (H) 07/27/2023   HDL 48.70 07/27/2023   CHOLHDL 3 07/27/2023   VLDL 34.6 07/27/2023   LDLCALC 84 07/27/2023   LDLCALC 105 (H) 08/13/2022   Lab Results  Component Value Date   TSH 0.84 07/27/2023   TSH 1.25 05/26/2022    Therapeutic Level Labs: No results found for: LITHIUM No results found for: VALPROATE No results found for: CBMZ  Current Medications: Current Outpatient Medications  Medication Sig Dispense Refill   amLODipine  (NORVASC ) 2.5 MG tablet Take 1 tablet (2.5 mg total) by mouth 2 (two) times daily. 180 tablet 3   apixaban  (ELIQUIS ) 5 MG TABS tablet TAKE 1 TABLET BY MOUTH 2 TIMES DAILY. 180 tablet 1   benzonatate  (TESSALON ) 100 MG capsule TAKE 2 CAPSULES BY MOUTH 3 TIMES DAILY AS NEEDED FOR COUGH. 30 capsule 0   Cholecalciferol  125 MCG (5000 UT) TABS Take 5,000 Units by mouth in the morning.     clobetasol  (TEMOVATE ) 0.05 % external solution APPLY DAILY UNTIL RASH IS CLEAR 50 mL 0   Coenzyme Q10 100 MG capsule Take 100 mg by mouth daily.     [employer] MAGNESIUM GLYCINATE PO Take by mouth.     Famotidine (ACID CONTROLLER PO) Take by mouth daily.     fluticasone (FLONASE) 50 MCG/ACT nasal spray Place into both nostrils daily.     metoprolol  succinate (TOPROL -XL) 25 MG 24 hr tablet Take 1 tablet (25 mg total) by mouth daily. Take with or immediately following a meal. 90  tablet 3   ondansetron  (ZOFRAN -ODT) 4 MG disintegrating tablet Take 1 tablet (4 mg total) by mouth every 8 (eight) hours as needed for nausea or vomiting. 20 tablet 0   rosuvastatin  (CRESTOR ) 10 MG tablet Take 1 tablet (10 mg total) by mouth daily. 90 tablet 1   tirzepatide  (ZEPBOUND ) 5 MG/0.5ML Pen Inject 5 mg into the skin once a week. 2 mL 5   ALPRAZolam  (XANAX ) 0.25 MG tablet Take 1 tablet (0.25 mg total) by mouth at bedtime. 30 tablet 2   citalopram  (CELEXA ) 20 MG tablet Take 1.5 tablets (30 mg total) by mouth daily. 135 tablet 0   No current facility-administered medications for this visit.    Medication Side Effects: none  Orders placed this visit:  No orders of the defined types were placed in this encounter.   Psychiatric Specialty Exam:  Review of Systems  Constitutional:  Positive for fatigue.  HENT:  Positive for congestion and hearing loss.   Eyes: Negative.   Respiratory:  Positive for cough.   Cardiovascular: Negative.   Gastrointestinal: Negative.   Endocrine: Negative.   Genitourinary:  Positive for frequency.  Musculoskeletal:  Positive for arthralgias, back pain and neck pain.  Skin:  Negative.   Allergic/Immunologic: Positive for environmental allergies.  Neurological:  Positive for dizziness.  Hematological: Negative.   Psychiatric/Behavioral:         See HPI    Blood pressure 129/75, pulse 60, height 5' 2 (1.575 m), weight 152 lb (68.9 kg).Body mass index is 27.8 kg/m.  General Appearance: Casual and Well Groomed  Eye Contact:  Good  Speech:  Clear and Coherent, Normal Rate, and Talkative  Volume:  Normal  Mood:  Euthymic  Affect:  Congruent  Thought Process:  Goal Directed and Descriptions of Associations: Circumstantial  Orientation:  Full (Time, Place, and Person)  Thought Content: Logical   Suicidal Thoughts:  No  Homicidal Thoughts:  No  Memory:  WNL  Judgement:  Good  Insight:  Good  Psychomotor Activity:  Normal  Concentration:   Concentration: Good  Recall:  Good  Fund of Knowledge: Good  Language: Good  Assets:  Communication Skills Desire for Improvement Financial Resources/Insurance Housing Transportation  ADL's:  Intact  Cognition: WNL  Prognosis:  Good   Screenings:  GAD-7    Loss adjuster, chartered Office Visit from 10/19/2023 in [facility] Visit from 08/04/2023 in [facility] Office Visit from 02/14/2023 in [facility] [employer] Visit from 08/13/2022 in [facility] [employer] [facility]  Total GAD-7 Score 2 4 0 3   PHQ2-9    Flowsheet Row Office Visit from 03/02/2024 in [facility] Office Visit from 12/07/2023 in [facility] Visit from 10/19/2023 in [facility] [employer] Visit from 08/04/2023 in [facility] Office Visit from 02/14/2023 in [facility] [employer] [facility]  PHQ-2 Total Score 1 1 0 0 0  PHQ-9 Total Score -- 4 0 1 1   Flowsheet Row UC from 02/04/2024 in [facility]  ED from 11/28/2023 in [facility] UC from 11/17/2023 in [facility]   C-SSRS RISK CATEGORY No Risk No Risk No Risk   Receiving Psychotherapy: Yes  with Marval Bunde, LCSW  Treatment Plan/Recommendations:   PDMP reviewed.  Xanax  filled 02/01/2024. I provided approximately  65 minutes of face to face time during this encounter, including time spent before and after the visit in records review, medical decision making, counseling pertinent to today's visit, and charting.   We had a long discussion about the efficacy of the Celexa .  She has been on the current dose for approximately 20 years.  It's probably not working as well as it did.  I recommend increasing the dose.  She  agrees.   We briefly discussed benzodiazepines and the increased risk of falls and confusion in the elderly.  She has been on this low dose at nighttime only for a very long time, I see no reason to stop it at this point.  She may not need it since the magnesium is helping her sleep.  In the future we may have her decrease the Xanax  to 1/2 pill every night for a few nights and then stop it.  She understands.  Continue Xanax  0.25 mg, 1 p.o. nightly. Increase Celexa  20 mg to 1.5 pills daily. Continue vitamins and supplements as per med list. Continue therapy with Marval Bunde, LCSW. Return in 6 weeks.  Verneita Cooks, PA-C

## 2024-03-12 ENCOUNTER — Other Ambulatory Visit: Payer: Self-pay | Admitting: Internal Medicine

## 2024-03-13 ENCOUNTER — Ambulatory Visit: Admitting: Psychiatry

## 2024-03-13 DIAGNOSIS — F411 Generalized anxiety disorder: Secondary | ICD-10-CM | POA: Diagnosis not present

## 2024-03-13 NOTE — Progress Notes (Unsigned)
  Electrophysiology Office Follow up Visit Note:    Date:  03/14/2024   ID:  ALIAHNA STATZER, DOB 08/15/1950, MRN [id number]  PCP:  Marylynn Verneita CROME, MD  [facility] Cardiologist:  None  [facility] Electrophysiologist:  OLE ONEIDA HOLTS, MD    Interval History:     Kathy Long is a 73 y.o. female who presents for a follow up visit.   I last saw the patient December 01, 2022.  She has a history of atrial fibrillation with a prior catheter ablation in April 2024.  She takes Eliquis  for stroke prophylaxis. She saw Dr. Perla November 21, 2023.  At that appointment she reported no breakthrough arrhythmias.  She was still taking metoprolol  and Eliquis .  She is doing well.  No recurrence of her arrhythmia.  She continues to take Eliquis  without bleeding issues.      Past medical, surgical, social and family history were reviewed.  ROS:   Please see the history of present illness.    All other systems reviewed and are negative.  EKGs/Labs/Other Studies Reviewed:    The following studies were reviewed today:          Physical Exam:    VS:  BP (!) 120/90 (BP Location: Left Arm, Patient Position: Sitting, Cuff Size: Normal)   Pulse 62   Ht 5' 2 (1.575 m)   Wt 153 lb (69.4 kg)   SpO2 93%   BMI 27.98 kg/m     Wt Readings from Last 3 Encounters:  03/14/24 153 lb (69.4 kg)  12/07/23 146 lb 9.6 oz (66.5 kg)  11/28/23 142 lb (64.4 kg)     GEN: no distress CARD: RRR, No MRG RESP: No IWOB. CTAB.      ASSESSMENT:    1. Paroxysmal atrial fibrillation (HCC)   2. Primary hypertension    PLAN:    In order of problems listed above:  #Atrial fibrillation Post ablation in April 2024.  No known recurrence. Continue Eliquis .  #Hypertension At goal today.  Recommend checking blood pressures 1-2 times per week at home and recording the values.  Recommend bringing these recordings to the primary care physician.  I discussed my upcoming departure from [facility] today.  I  will transition her care to Dr. Kennyth, one of my EP colleagues.  She can follow-up with EP on an as-needed basis.  Signed, OLE HOLTS, MD, [facility], [facility] 03/14/2024 11:34 AM    Electrophysiology [facility]

## 2024-03-13 NOTE — Progress Notes (Signed)
 [facility] Counselor/Therapist Progress Note  Patient ID: Kathy Long, MRN: [id number],    Date: 03/13/2024  Time Spent: 53 minutes   Treatment Type: Individual Therapy  Reported Symptoms:  anxiety, anger, frustration   Mental Status Exam:  Appearance:   Casual and Neat     Behavior:  Appropriate, Sharing, and Motivated  Motor:  Normal  Speech/Language:   Clear and Coherent  Affect:  anxious  Mood:  anxious  Thought process:  goal directed  Thought content:    Rumination  Sensory/Perceptual disturbances:    WNL  Orientation:  oriented to person, place, time/date, situation, day of week, month of year, year, and stated date of Oct. 14, 2025  Attention:  Good  Concentration:  Good  Memory:  WNL  Fund of knowledge:   Good  Insight:    Good  Judgment:   Good  Impulse Control:  Good   Risk Assessment: Danger to Self:  No Self-injurious Behavior: No Danger to Others: No Duty to Warn:no Physical Aggression / Violence:No  Access to Firearms a concern: No  Gang Involvement:No   Subjective:   Patient working further in session today on her anxiety, frustrations, and some anger. Everywhere I go I feel resistance to the way I see things.  Very frustrated and concerned with the political scene currently and shared her concerns before getting back on topic with her own mental health concerns and how she feels so influenced.  Working to use self calming skills more effectively and choose carefully what she gives her attention to, making wise decisions and sticking by them.  Shared several situations in session today involving her participation in certain activities and is working on making better choices in these activities and how to set limits with other adults as needed, including even in the family.  (Not all details included in this note due to patient privacy needs).  Patient was more calm and reflective in a positive way at end of session, gaining some insight into her  thoughts and feelings about certain issues within the family where there are differences, and also ways of better managing her thoughts and feelings in terms of whether to share or no need to share as it may hurt versus help.  Encouraged to make more intentional contact with friends that are healthy for her and spending some time outside each day.  Has done some of this previously and noted that it made a difference in her day.  Interventions: Cognitive Behavioral Therapy, Solution-Oriented/Positive Psychology, and Ego-Supportive 1.Reduce overall level, frequency, and intensity of the anxiety so that daily functioning is not impaired. 2.Increase understanding of beliefs and messages that produce the worry and anxiety. 3.Explore cognitive messages that mediate anxiety response and retrain in adaptive cognitions.    Diagnosis:   ICD-10-CM   1. Generalized anxiety disorder  F41.1      Plan:   Patient today in session working further on her stressors, setting limits with other people and particularly negative people, and work on decreasing the time spent watching political programming especially when it impacts her mood negatively.  Is more motivated and wanting to make healthier changes, no tearfulness today and less overall sadness except regarding some of the situations in the world about which she is concerned, less denial about the prior influence of politics in her life and better able to state what she wants in her life now, and setting better limits for herself in terms of watching excessive politics,  and choosing relationships that are best for her as she desires to move in a healthier direction.  Goal review and progress/challenges noted with patient.  Next appointment within 3 to 4 weeks.   Barnie Bunde, LCSW

## 2024-03-14 ENCOUNTER — Encounter: Payer: Self-pay | Admitting: Cardiology

## 2024-03-14 ENCOUNTER — Ambulatory Visit: Attending: Cardiology | Admitting: Cardiology

## 2024-03-14 VITALS — BP 120/90 | HR 62 | Ht 62.0 in | Wt 153.0 lb

## 2024-03-14 DIAGNOSIS — I480 Paroxysmal atrial fibrillation: Secondary | ICD-10-CM | POA: Diagnosis not present

## 2024-03-14 DIAGNOSIS — I10 Essential (primary) hypertension: Secondary | ICD-10-CM

## 2024-03-14 NOTE — Patient Instructions (Signed)
 Medication Instructions:  Your physician recommends that you continue on your current medications as directed. Please refer to the Current Medication list given to you today.  *If you need a refill on your cardiac medications before your next appointment, please call your pharmacy*  Follow-Up: At [facility], you and your health needs are our priority.  As part of our continuing mission to provide you with exceptional heart care, our providers are all part of one team.  This team includes your primary Cardiologist (physician) and Advanced Practice Providers or APPs (Physician Assistants and Nurse Practitioners) who all work together to provide you with the care you need, when you need it.  Your next appointment:   As needed with EP

## 2024-03-19 DIAGNOSIS — M65331 Trigger finger, right middle finger: Secondary | ICD-10-CM | POA: Diagnosis not present

## 2024-03-19 DIAGNOSIS — M65332 Trigger finger, left middle finger: Secondary | ICD-10-CM | POA: Diagnosis not present

## 2024-03-21 ENCOUNTER — Encounter: Payer: Self-pay | Admitting: Internal Medicine

## 2024-03-21 DIAGNOSIS — R0609 Other forms of dyspnea: Secondary | ICD-10-CM

## 2024-03-21 DIAGNOSIS — R053 Chronic cough: Secondary | ICD-10-CM

## 2024-03-21 DIAGNOSIS — R059 Cough, unspecified: Secondary | ICD-10-CM

## 2024-04-09 ENCOUNTER — Encounter: Payer: Self-pay | Admitting: Physician Assistant

## 2024-04-09 ENCOUNTER — Ambulatory Visit: Admitting: Physician Assistant

## 2024-04-09 DIAGNOSIS — R5381 Other malaise: Secondary | ICD-10-CM

## 2024-04-09 DIAGNOSIS — Z79899 Other long term (current) drug therapy: Secondary | ICD-10-CM

## 2024-04-09 DIAGNOSIS — F411 Generalized anxiety disorder: Secondary | ICD-10-CM

## 2024-04-09 DIAGNOSIS — F331 Major depressive disorder, recurrent, moderate: Secondary | ICD-10-CM

## 2024-04-09 MED ORDER — CITALOPRAM HYDROBROMIDE 40 MG PO TABS: 40.0000 mg | ORAL_TABLET | Freq: Every day | ORAL | 1 refills | Status: DC

## 2024-04-09 NOTE — Progress Notes (Signed)
 [facility]  Patient ID: Kathy Long,  MRN: [id number]  PCP: Marylynn Verneita CROME, MD  Date of Evaluation: 04/09/2024 Time spent:30 minutes  Chief Complaint:  Chief Complaint   Anxiety; Depression; Follow-up    HISTORY/CURRENT STATUS: HPI For 6 week med check  We increased Celexa  at the LOV.  States she feels about the same.  Usually when she gets up, takes the Celexa  and starts her day she feels better.  She does not stay in bed but she stays tired all the time.  Has low energy to the point that she has to sit down for a while after she gets out of the shower.  No shortness of breath or chest pain.  This has been an ongoing problem for a while now.  She is able to enjoy things but has to push herself sometimes.  Appetite is normal and weight is stable.  ADLs and personal hygiene are normal.  She does not cry easily.  No mania, psychosis, or delirium.  No suicidal or homicidal thoughts.  She takes the Xanax  every evening or else she has a hard time relaxing to go to sleep.  Not usually anxious during the day.  She had a stressful situation recently when her niece got married, she does not align with their political views so it is hard when they are all together.  But she got through it.  She is concerned about her sister who has idiopathic pulmonary fibrosis and she is afraid she may not have long to live so that has been stressful.  Review of Systems  Constitutional:  Positive for malaise/fatigue.  HENT: Negative.    Eyes: Negative.   Respiratory: Negative.    Cardiovascular: Negative.   Gastrointestinal: Negative.   Genitourinary: Negative.   Musculoskeletal: Negative.   Skin: Negative.   Neurological: Negative.   Endo/Heme/Allergies: Negative.   Psychiatric/Behavioral:         See HPI    Individual Medical History/ Review of Systems: Changes? :Yes see HPI  Past medications for mental health diagnoses include: Celexa , Xanax , Melatonin, Valium  No suicide  attempts, no psych hospitalizations  Allergies: Morphine  and codeine, Tramadol, and Tape  Current Medications:  Current Outpatient Medications:    ALPRAZolam  (XANAX ) 0.25 MG tablet, Take 1 tablet (0.25 mg total) by mouth at bedtime., Disp: 30 tablet, Rfl: 2   amLODipine  (NORVASC ) 2.5 MG tablet, Take 1 tablet (2.5 mg total) by mouth 2 (two) times daily., Disp: 180 tablet, Rfl: 3   apixaban  (ELIQUIS ) 5 MG TABS tablet, TAKE 1 TABLET BY MOUTH 2 TIMES DAILY., Disp: 180 tablet, Rfl: 1   citalopram  (CELEXA ) 40 MG tablet, Take 1 tablet (40 mg total) by mouth daily., Disp: 30 tablet, Rfl: 1   clobetasol  (TEMOVATE ) 0.05 % external solution, APPLY DAILY UNTIL RASH IS CLEAR, Disp: 50 mL, Rfl: 0   [employer] MAGNESIUM GLYCINATE PO, Take by mouth., Disp: , Rfl:    Famotidine (ACID CONTROLLER PO), Take by mouth daily., Disp: , Rfl:    fluticasone (FLONASE) 50 MCG/ACT nasal spray, Place into both nostrils daily., Disp: , Rfl:    metoprolol  succinate (TOPROL -XL) 25 MG 24 hr tablet, Take 1 tablet (25 mg total) by mouth daily. Take with or immediately following a meal., Disp: 90 tablet, Rfl: 3   ondansetron  (ZOFRAN ) 4 MG tablet, TAKE ONE TABLET EVERY EIGHT HOURS IF NEEDED FOR NAUSEA AND VOMITING, Disp: 20 tablet, Rfl: 0   ondansetron  (ZOFRAN -ODT) 4 MG disintegrating tablet, Take 1 tablet (4  mg total) by mouth every 8 (eight) hours as needed for nausea or vomiting., Disp: 20 tablet, Rfl: 0   rosuvastatin  (CRESTOR ) 10 MG tablet, Take 1 tablet (10 mg total) by mouth daily., Disp: 90 tablet, Rfl: 1   tirzepatide  (ZEPBOUND ) 5 MG/0.5ML Pen, Inject 5 mg into the skin once a week., Disp: 2 mL, Rfl: 5   benzonatate  (TESSALON ) 100 MG capsule, TAKE 2 CAPSULES BY MOUTH 3 TIMES DAILY AS NEEDED FOR COUGH. (Patient not taking: Reported on 04/09/2024), Disp: 30 capsule, Rfl: 0   Cholecalciferol  125 MCG (5000 UT) TABS, Take 5,000 Units by mouth in the morning. (Patient not taking: Reported on 04/09/2024), Disp: , Rfl:    Coenzyme Q10 100  MG capsule, Take 100 mg by mouth daily. (Patient not taking: Reported on 04/09/2024), Disp: , Rfl:  Medication Side Effects: none  Family Medical/ Social History: Changes? No  MENTAL HEALTH EXAM:  There were no vitals taken for this visit.There is no height or weight on file to calculate BMI.  General Appearance: Casual and Well Groomed  Eye Contact:  Good  Speech:  Clear and Coherent and Normal Rate  Volume:  Normal  Mood:  Euthymic  Affect:  Congruent  Thought Process:  Goal Directed and Descriptions of Associations: Circumstantial  Orientation:  Full (Time, Place, and Person)  Thought Content: Logical   Suicidal Thoughts:  No  Homicidal Thoughts:  No  Memory:  WNL  Judgement:  Good  Insight:  Good  Psychomotor Activity:  Normal  Concentration:  Concentration: Good  Recall:  Good  Fund of Knowledge: Good  Language: Good  Assets:  Communication Skills Desire for Improvement Financial Resources/Insurance Housing Transportation Vocational/Educational  ADL's:  Intact  Cognition: WNL  Prognosis:  Good   DIAGNOSES:    ICD-10-CM   1. Major depressive disorder, recurrent episode, moderate (HCC)  F33.1 CBC with Differential/Platelet    Comprehensive metabolic panel    VITAMIN D  25 Hydroxy (Vit-D Deficiency, Fractures)    TSH    B12 and Folate Panel    2. Generalized anxiety disorder  F41.1     3. Malaise and fatigue  R53.81 CBC with Differential/Platelet   R53.83 Comprehensive metabolic panel    VITAMIN D  25 Hydroxy (Vit-D Deficiency, Fractures)    TSH    B12 and Folate Panel    4. Encounter for long-term (current) use of medications  Z79.899 CBC with Differential/Platelet    Comprehensive metabolic panel    VITAMIN D  25 Hydroxy (Vit-D Deficiency, Fractures)    TSH    B12 and Folate Panel      Receiving Psychotherapy: Yes with Marval Bunde, LCSW  RECOMMENDATIONS:   PDMP reviewed.  Xanax  filled 03/02/2024. I provided approximately 30 minutes of face to face time  during this encounter, including time spent before and after the visit in records review, medical decision making, counseling pertinent to today's visit, and charting.   We discussed the symptoms of fatigue.  She has not had a TSH, vitamin D  level or B12 level checked in a while so I recommend ordering those along with CBC and CMP.  She agrees.  In the meantime we will increase Celexa  and have her take it at night.  Continue Xanax  0.25 mg, 1 p.o. nightly. Increase Celexa  to 40 mg daily.  (She'll take her 20 mg, 2 daily until she runs out.) Continue vitamins as listed on med sheet. Will add B12 and vitamin D  if labs warrant. Continue therapy with Marval Bunde, LCSW. Return  in 6 to 8 weeks.  Verneita Cooks, PA-C

## 2024-04-10 DIAGNOSIS — R5383 Other fatigue: Secondary | ICD-10-CM | POA: Diagnosis not present

## 2024-04-10 DIAGNOSIS — Z79899 Other long term (current) drug therapy: Secondary | ICD-10-CM | POA: Diagnosis not present

## 2024-04-10 DIAGNOSIS — R5381 Other malaise: Secondary | ICD-10-CM | POA: Diagnosis not present

## 2024-04-11 ENCOUNTER — Ambulatory Visit: Payer: Self-pay | Admitting: Physician Assistant

## 2024-04-11 LAB — CBC WITH DIFFERENTIAL/PLATELET
Basophils Absolute: 0.1 x10E3/uL (ref 0.0–0.2)
Basos: 1 %
EOS (ABSOLUTE): 0.3 x10E3/uL (ref 0.0–0.4)
Eos: 5 %
Hematocrit: 39.8 % (ref 34.0–46.6)
Hemoglobin: 13.1 g/dL (ref 11.1–15.9)
Immature Grans (Abs): 0 x10E3/uL (ref 0.0–0.1)
Immature Granulocytes: 0 %
Lymphocytes Absolute: 1.6 x10E3/uL (ref 0.7–3.1)
Lymphs: 28 %
MCH: 32 pg (ref 26.6–33.0)
MCHC: 32.9 g/dL (ref 31.5–35.7)
MCV: 97 fL (ref 79–97)
Monocytes Absolute: 0.5 x10E3/uL (ref 0.1–0.9)
Monocytes: 9 %
Neutrophils Absolute: 3.1 x10E3/uL (ref 1.4–7.0)
Neutrophils: 57 %
Platelets: 195 x10E3/uL (ref 150–450)
RBC: 4.09 x10E6/uL (ref 3.77–5.28)
RDW: 12 % (ref 11.7–15.4)
WBC: 5.6 x10E3/uL (ref 3.4–10.8)

## 2024-04-11 LAB — VITAMIN D 25 HYDROXY (VIT D DEFICIENCY, FRACTURES): Vit D, 25-Hydroxy: 48.2 ng/mL (ref 30.0–100.0)

## 2024-04-11 LAB — COMPREHENSIVE METABOLIC PANEL WITH GFR
ALT: 23 IU/L (ref 0–32)
AST: 22 IU/L (ref 0–40)
Albumin: 4.2 g/dL (ref 3.8–4.8)
Alkaline Phosphatase: 92 IU/L (ref 49–135)
BUN/Creatinine Ratio: 13 (ref 12–28)
BUN: 15 mg/dL (ref 8–27)
Bilirubin Total: 1.2 mg/dL (ref 0.0–1.2)
CO2: 22 mmol/L (ref 20–29)
Calcium: 9.3 mg/dL (ref 8.7–10.3)
Chloride: 106 mmol/L (ref 96–106)
Creatinine, Ser: 1.14 mg/dL — ABNORMAL HIGH (ref 0.57–1.00)
Globulin, Total: 1.9 g/dL (ref 1.5–4.5)
Glucose: 113 mg/dL — ABNORMAL HIGH (ref 70–99)
Potassium: 4.8 mmol/L (ref 3.5–5.2)
Sodium: 144 mmol/L (ref 134–144)
Total Protein: 6.1 g/dL (ref 6.0–8.5)
eGFR: 50 mL/min/1.73 — ABNORMAL LOW (ref >59)

## 2024-04-11 LAB — TSH: TSH: 1.46 u[IU]/mL (ref 0.450–4.500)

## 2024-04-11 LAB — B12 AND FOLATE PANEL
Folate: 11.7 ng/mL (ref >3.0)
Vitamin B-12: 498 pg/mL (ref 232–1245)

## 2024-04-11 NOTE — Progress Notes (Signed)
 Please let her know the vitamin D  level is good at 48.2.  We would like for it to be at least 50-60 though so she needs to restart the 5000 units of vitamin D  daily.  Her B12 and folate were within normal limits.  No signs of iron deficiency anemia.  Her blood sugar was 113 but she was not fasting so that is okay.  Her creatinine (kidney test) was very slightly elevated but not significant in my opinion.  She should discuss with her primary provider though.

## 2024-04-12 ENCOUNTER — Ambulatory Visit: Admitting: Psychiatry

## 2024-04-12 DIAGNOSIS — F331 Major depressive disorder, recurrent, moderate: Secondary | ICD-10-CM | POA: Diagnosis not present

## 2024-04-12 NOTE — Progress Notes (Signed)
      [facility] Counselor/Therapist Progress Note  Patient ID: Kathy Long, MRN: [id number],    Date: 04/12/2024  Time Spent: 53 minutes   Treatment Type: Individual Therapy  Reported Symptoms: anxiety, anger, frustration, depression    Mental Status Exam:  Appearance:   Casual, Neat, and Well Groomed     Behavior:  Appropriate, Sharing, and Motivated  Motor:  Normal  Speech/Language:   Clear and Coherent  Affect:  Depressed and anxious  Mood:  anxious and depressed  Thought process:  goal directed  Thought content:    Rumination  Sensory/Perceptual disturbances:    WNL  Orientation:  oriented to person, place, time/date, situation, day of week, month of year, year, and stated date of Nov. 13, 2025  Attention:  Good  Concentration:  Good  Memory:  WNL  Fund of knowledge:   Good  Insight:    Good  Judgment:   Good  Impulse Control:  Good   Risk Assessment: Danger to Self:  No Self-injurious Behavior: No Danger to Others: No Duty to Warn:no Physical Aggression / Violence:No  Access to Firearms a concern: No  Gang Involvement:No    Subjective:  Patient in session today and working further on her anxiety, frustrations, depression, anger, and sometimes hopeless feeling but never thoughts of self-harm. Sensitivity to aging and noticing arthritis more but still very actively involved in life and various activities. Still notices that she wakes up feeling sad often but then gets over it rather quickly. Struggling with some personal and family/friend issues and feels it may be worse due to the upcoming holidays. Also upset and concerned about  friend and the situation in which that friend lives. Realizing she cannot really fix the situation and is working to develop healthier ways of coping and letting go, and not give others in her life excessive power. She also finds this practice to be grounding  for her and is having to work at this.  Emphasis on making wise decisions  and following through on them.  Setting healthier limits with other adults including family and friends as needed.  Continues in efforts to make more intentional contact with friends that are healthy for her and set limits in other relationships as needed.   Interventions: Cognitive Behavioral Therapy, Solution-Oriented/Positive Psychology, and Ego-Supportive 1.Reduce overall level, frequency, and intensity of the anxiety so that daily functioning is not impaired. 2.Increase understanding of beliefs and messages that produce the worry and anxiety. 3.Explore cognitive messages that mediate anxiety response and retrain in adaptive cognitions.    Diagnosis:   ICD-10-CM   1. Major depressive disorder, recurrent episode, moderate (HCC)  F33.1      Plan: Patient working in session today more on her stressors, including setting limits with others and negative people.Working on decreasing the attention she gives to TV focusing on all the negatives in the world and trying to work further on choosing to be happier by leaning more in that direction.  Continuing to show good motivation and her work on healthier and more stabilizing changes.  Showing more strength in certain ways as discussed in session today and also setting some healthier limits with worries over politics as she is moving in a better direction.  Goal review and progress/challenges noted with patient.  Next appointment within 3 to 4 weeks.   Barnie Bunde, LCSW

## 2024-04-17 NOTE — Telephone Encounter (Signed)
 open in error

## 2024-04-18 ENCOUNTER — Other Ambulatory Visit: Payer: Self-pay | Admitting: Physician Assistant

## 2024-04-18 ENCOUNTER — Other Ambulatory Visit: Payer: Self-pay | Admitting: Cardiology

## 2024-04-19 ENCOUNTER — Encounter: Payer: Self-pay | Admitting: Pharmacist

## 2024-04-19 NOTE — Telephone Encounter (Signed)
 Prescription refill request for Eliquis  received. Indication:afib Last office visit:10/25 Scr: 1.14  11/25 Age:73 Weight:69.4  kg  Prescription refilled

## 2024-04-19 NOTE — Progress Notes (Signed)
 Pharmacy Quality Measure Review  This patient is appearing on a report for being at risk of failing the adherence measure for cholesterol (statin) medications this calendar year.   Medication: rosuvastatin  10 mg Last fill date: 8/18 for 90 day supply  Insurance report was not up to date. No action needed at this time.  Medication has been refilled as of 04/18/24 x90ds. Next refill 2026.

## 2024-04-24 DIAGNOSIS — G4733 Obstructive sleep apnea (adult) (pediatric): Secondary | ICD-10-CM | POA: Diagnosis not present

## 2024-04-24 DIAGNOSIS — I480 Paroxysmal atrial fibrillation: Secondary | ICD-10-CM | POA: Diagnosis not present

## 2024-04-24 DIAGNOSIS — N1831 Chronic kidney disease, stage 3a: Secondary | ICD-10-CM | POA: Diagnosis not present

## 2024-04-24 DIAGNOSIS — J849 Interstitial pulmonary disease, unspecified: Secondary | ICD-10-CM | POA: Diagnosis not present

## 2024-05-02 ENCOUNTER — Other Ambulatory Visit: Payer: Self-pay | Admitting: Internal Medicine

## 2024-05-02 DIAGNOSIS — G4733 Obstructive sleep apnea (adult) (pediatric): Secondary | ICD-10-CM

## 2024-05-02 NOTE — Telephone Encounter (Signed)
 Attempted to contact pt vm box is full calling to see if pt wanted to remain at 5 mg

## 2024-05-03 NOTE — Telephone Encounter (Signed)
2nd attempt to contact pt.

## 2024-05-04 NOTE — Telephone Encounter (Signed)
 Noted  Done

## 2024-05-08 DIAGNOSIS — Z9889 Other specified postprocedural states: Secondary | ICD-10-CM | POA: Diagnosis not present

## 2024-05-08 DIAGNOSIS — Z961 Presence of intraocular lens: Secondary | ICD-10-CM | POA: Diagnosis not present

## 2024-05-08 DIAGNOSIS — H3552 Pigmentary retinal dystrophy: Secondary | ICD-10-CM | POA: Diagnosis not present

## 2024-05-21 ENCOUNTER — Other Ambulatory Visit: Payer: Self-pay | Admitting: Physician Assistant

## 2024-05-28 ENCOUNTER — Ambulatory Visit: Admitting: Physician Assistant

## 2024-05-28 ENCOUNTER — Encounter: Payer: Self-pay | Admitting: Physician Assistant

## 2024-05-28 DIAGNOSIS — F3341 Major depressive disorder, recurrent, in partial remission: Secondary | ICD-10-CM | POA: Diagnosis not present

## 2024-05-28 DIAGNOSIS — F411 Generalized anxiety disorder: Secondary | ICD-10-CM

## 2024-05-28 NOTE — Progress Notes (Signed)
 "     [facility]  Patient ID: Kathy Long,  MRN: [id number]  PCP: Marylynn Verneita CROME, MD  Date of Evaluation: 05/28/2024 Time spent:20 minutes  Chief Complaint:  Chief Complaint   Anxiety; Depression; Follow-up    HISTORY/CURRENT STATUS: HPI For 6 week med check  She has been taking 80 mg (2 of the 40 mg pills) of Celexa  by mistake. (At least she thinks so.  Not sure.  She may be taking 20 mg, 2 of those. She'll look when she gets home.)  It hasn't caused her any probs that she knows of.  It is hard to say if the depression is better or not.  This time of year is always very hard for her anyway.  She prefers to make no changes in medication at this time.  ADLs and personal hygiene are normal.    No feelings of hopelessness.  Sleeps well most of the time.  No change in memory.  Appetite has not changed.  Anxiety is controlled.  She does take the Xanax  as needed.  No mania, delirium, AH/VH.  No SI/HI.  Individual Medical History/ Review of Systems: Changes? :No     Past medications for mental health diagnoses include: Celexa , Xanax , Melatonin, Valium  No suicide attempts, no psych hospitalizations  Allergies: Morphine  and codeine, Tramadol, and Tape  Current Medications:  Current Outpatient Medications:    ALPRAZolam  (XANAX ) 0.25 MG tablet, Take 1 tablet (0.25 mg total) by mouth at bedtime., Disp: 30 tablet, Rfl: 2   amLODipine  (NORVASC ) 2.5 MG tablet, Take 1 tablet (2.5 mg total) by mouth 2 (two) times daily., Disp: 180 tablet, Rfl: 3   citalopram  (CELEXA ) 40 MG tablet, TAKE ONE TABLET BY MOUTH ONCE DAILY, Disp: 30 tablet, Rfl: 1   clobetasol  (TEMOVATE ) 0.05 % external solution, APPLY DAILY UNTIL RASH IS CLEAR, Disp: 50 mL, Rfl: 0   [employer] MAGNESIUM GLYCINATE PO, Take by mouth., Disp: , Rfl:    ELIQUIS  5 MG TABS tablet, TAKE 1 TABLET BY MOUTH 2 TIMES DAILY., Disp: 180 tablet, Rfl: 1   Famotidine (ACID CONTROLLER PO), Take by mouth daily., Disp: , Rfl:    fluticasone  (FLONASE) 50 MCG/ACT nasal spray, Place into both nostrils daily., Disp: , Rfl:    metoprolol  succinate (TOPROL -XL) 25 MG 24 hr tablet, Take 1 tablet (25 mg total) by mouth daily. Take with or immediately following a meal., Disp: 90 tablet, Rfl: 3   ondansetron  (ZOFRAN ) 4 MG tablet, TAKE ONE TABLET EVERY EIGHT HOURS IF NEEDED FOR NAUSEA AND VOMITING, Disp: 20 tablet, Rfl: 0   ondansetron  (ZOFRAN -ODT) 4 MG disintegrating tablet, Take 1 tablet (4 mg total) by mouth every 8 (eight) hours as needed for nausea or vomiting., Disp: 20 tablet, Rfl: 0   rosuvastatin  (CRESTOR ) 10 MG tablet, Take 1 tablet (10 mg total) by mouth daily., Disp: 90 tablet, Rfl: 1   tirzepatide  (ZEPBOUND ) 5 MG/0.5ML Pen, INJECT 5MG  (1 PEN) UNDER THE SKIN EVERY WEEK, Disp: 2 mL, Rfl: 3   benzonatate  (TESSALON ) 100 MG capsule, TAKE 2 CAPSULES BY MOUTH 3 TIMES DAILY AS NEEDED FOR COUGH. (Patient not taking: Reported on 04/09/2024), Disp: 30 capsule, Rfl: 0   Cholecalciferol  125 MCG (5000 UT) TABS, Take 5,000 Units by mouth in the morning. (Patient not taking: Reported on 04/09/2024), Disp: , Rfl:    Coenzyme Q10 100 MG capsule, Take 100 mg by mouth daily. (Patient not taking: Reported on 04/09/2024), Disp: , Rfl:  Medication Side Effects: none  Family Medical/ Social History: Changes? No  MENTAL HEALTH EXAM:  There were no vitals taken for this visit.There is no height or weight on file to calculate BMI.  General Appearance: Casual and Well Groomed  Eye Contact:  Good  Speech:  Clear and Coherent and Normal Rate  Volume:  Normal  Mood:  Euthymic  Affect:  Congruent  Thought Process:  Goal Directed and Descriptions of Associations: Circumstantial  Orientation:  Full (Time, Place, and Person)  Thought Content: Logical   Suicidal Thoughts:  No  Homicidal Thoughts:  No  Memory:  WNL  Judgement:  Good  Insight:  Good  Psychomotor Activity:  Normal  Concentration:  Concentration: Good  Recall:  Good  Fund of Knowledge: Good   Language: Good  Assets:  Communication Skills Desire for Improvement Financial Resources/Insurance Housing Resilience Transportation Vocational/Educational  ADL's:  Intact  Cognition: WNL  Prognosis:  Good   DIAGNOSES:    ICD-10-CM   1. Recurrent major depression in partial remission  F33.41     2. Generalized anxiety disorder  F41.1       Receiving Psychotherapy: Yes with Marval Bunde, LCSW  RECOMMENDATIONS:   PDMP reviewed.  Xanax  filled 05/22/2024. I provided approximately 20 minutes of face to face time during this encounter, including time spent before and after the visit in records review, medical decision making, counseling pertinent to today's visit, and charting.   I stressed the importance of taking the Celexa  no more than 40 mg daily.  She will check her bottle when she gets home and if she's taking 80 mg, she will decrease to 40 mg.  Serotonin syndrome symptoms were briefly discussed, call if she has any problems.  She understands.  Continue Xanax  0.25 mg, 1 p.o. nightly. Note above: Celexa   40 mg daily.    Continue vitamins as listed on med sheet. Continue therapy with Marval Bunde, LCSW. Return in 3 months.  Verneita Cooks, PA-C  "

## 2024-06-06 ENCOUNTER — Ambulatory Visit: Admitting: Psychiatry

## 2024-06-06 DIAGNOSIS — F3341 Major depressive disorder, recurrent, in partial remission: Secondary | ICD-10-CM
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
## 2024-06-11 ENCOUNTER — Encounter: Payer: Self-pay | Admitting: Emergency Medicine

## 2024-06-11 ENCOUNTER — Encounter: Payer: Self-pay | Admitting: Internal Medicine

## 2024-06-11 ENCOUNTER — Other Ambulatory Visit: Payer: Self-pay

## 2024-06-11 DIAGNOSIS — I4891 Unspecified atrial fibrillation: Secondary | ICD-10-CM | POA: Diagnosis not present

## 2024-06-11 DIAGNOSIS — N183 Chronic kidney disease, stage 3 unspecified: Secondary | ICD-10-CM | POA: Diagnosis not present

## 2024-06-11 DIAGNOSIS — Z85828 Personal history of other malignant neoplasm of skin: Secondary | ICD-10-CM | POA: Diagnosis not present

## 2024-06-11 DIAGNOSIS — R42 Dizziness and giddiness: Secondary | ICD-10-CM | POA: Diagnosis present

## 2024-06-11 DIAGNOSIS — Z79899 Other long term (current) drug therapy: Secondary | ICD-10-CM | POA: Insufficient documentation

## 2024-06-11 DIAGNOSIS — Z8616 Personal history of COVID-19: Secondary | ICD-10-CM | POA: Insufficient documentation

## 2024-06-11 DIAGNOSIS — I129 Hypertensive chronic kidney disease with stage 1 through stage 4 chronic kidney disease, or unspecified chronic kidney disease: Secondary | ICD-10-CM | POA: Insufficient documentation

## 2024-06-11 DIAGNOSIS — Z86011 Personal history of benign neoplasm of the brain: Secondary | ICD-10-CM | POA: Insufficient documentation

## 2024-06-11 LAB — CBC WITH DIFFERENTIAL/PLATELET
Abs Immature Granulocytes: 0.01 K/uL (ref 0.00–0.07)
Basophils Absolute: 0.1 K/uL (ref 0.0–0.1)
Basophils Relative: 1 %
Eosinophils Absolute: 0.4 K/uL (ref 0.0–0.5)
Eosinophils Relative: 6 %
HCT: 42.6 % (ref 36.0–46.0)
Hemoglobin: 14 g/dL (ref 12.0–15.0)
Immature Granulocytes: 0 %
Lymphocytes Relative: 30 %
Lymphs Abs: 1.9 K/uL (ref 0.7–4.0)
MCH: 31.6 pg (ref 26.0–34.0)
MCHC: 32.9 g/dL (ref 30.0–36.0)
MCV: 96.2 fL (ref 80.0–100.0)
Monocytes Absolute: 0.6 K/uL (ref 0.1–1.0)
Monocytes Relative: 9 %
Neutro Abs: 3.4 K/uL (ref 1.7–7.7)
Neutrophils Relative %: 54 %
Platelets: 228 K/uL (ref 150–400)
RBC: 4.43 MIL/uL (ref 3.87–5.11)
RDW: 12.2 % (ref 11.5–15.5)
WBC: 6.3 K/uL (ref 4.0–10.5)
nRBC: 0 % (ref 0.0–0.2)

## 2024-06-11 LAB — COMPREHENSIVE METABOLIC PANEL WITH GFR
ALT: 24 U/L (ref 0–44)
AST: 27 U/L (ref 15–41)
Albumin: 4.5 g/dL (ref 3.5–5.0)
Alkaline Phosphatase: 98 U/L (ref 38–126)
Anion gap: 10 (ref 5–15)
BUN: 19 mg/dL (ref 8–23)
CO2: 26 mmol/L (ref 22–32)
Calcium: 9.7 mg/dL (ref 8.9–10.3)
Chloride: 103 mmol/L (ref 98–111)
Creatinine, Ser: 1.21 mg/dL — ABNORMAL HIGH (ref 0.44–1.00)
GFR, Estimated: 47 mL/min — ABNORMAL LOW
Glucose, Bld: 106 mg/dL — ABNORMAL HIGH (ref 70–99)
Potassium: 4.8 mmol/L (ref 3.5–5.1)
Sodium: 139 mmol/L (ref 135–145)
Total Bilirubin: 1.1 mg/dL (ref 0.0–1.2)
Total Protein: 6.9 g/dL (ref 6.5–8.1)

## 2024-06-11 NOTE — ED Triage Notes (Signed)
 Pt presents to the ED via POV with complaints of HTN - hx of same. She states her BP was elevated tonight 170s and went to the UC who advised her to come to the ED. No associated sx at this time.  A&Ox4 at this time. Denies CP or SOB.    BP at home 144/91 - compliant with meds.

## 2024-06-12 ENCOUNTER — Emergency Department

## 2024-06-12 ENCOUNTER — Emergency Department
Admission: EM | Admit: 2024-06-12 | Discharge: 2024-06-12 | Disposition: A | Attending: Emergency Medicine | Admitting: Emergency Medicine

## 2024-06-12 DIAGNOSIS — R42 Dizziness and giddiness: Secondary | ICD-10-CM

## 2024-06-12 DIAGNOSIS — I10 Essential (primary) hypertension: Secondary | ICD-10-CM

## 2024-06-12 LAB — URINALYSIS, ROUTINE W REFLEX MICROSCOPIC
Bilirubin Urine: NEGATIVE
Glucose, UA: NEGATIVE mg/dL
Hgb urine dipstick: NEGATIVE
Ketones, ur: NEGATIVE mg/dL
Nitrite: NEGATIVE
Protein, ur: NEGATIVE mg/dL
Specific Gravity, Urine: 1.009 (ref 1.005–1.030)
pH: 7 (ref 5.0–8.0)

## 2024-06-12 MED ORDER — APIXABAN 5 MG PO TABS
5.0000 mg | ORAL_TABLET | Freq: Once | ORAL | Status: AC
  Administered 2024-06-12: 5 mg via ORAL
  Filled 2024-06-12: qty 1

## 2024-06-12 MED ORDER — MECLIZINE HCL 25 MG PO TABS: 25.0000 mg | ORAL_TABLET | Freq: Three times a day (TID) | ORAL | 0 refills | Status: AC | PRN

## 2024-06-12 MED ORDER — ONDANSETRON HCL 4 MG/2ML IJ SOLN
4.0000 mg | Freq: Once | INTRAMUSCULAR | Status: AC
  Administered 2024-06-12: 4 mg via INTRAVENOUS
  Filled 2024-06-12: qty 2

## 2024-06-12 MED ORDER — AMLODIPINE BESYLATE 5 MG PO TABS
2.5000 mg | ORAL_TABLET | Freq: Once | ORAL | Status: AC
  Administered 2024-06-12: 2.5 mg via ORAL
  Filled 2024-06-12: qty 1

## 2024-06-12 MED ORDER — MECLIZINE HCL 25 MG PO TABS
25.0000 mg | ORAL_TABLET | Freq: Once | ORAL | Status: AC
  Administered 2024-06-12: 25 mg via ORAL
  Filled 2024-06-12: qty 1

## 2024-06-12 MED ORDER — METOPROLOL TARTRATE 25 MG PO TABS
25.0000 mg | ORAL_TABLET | Freq: Once | ORAL | Status: AC
  Administered 2024-06-12: 25 mg via ORAL
  Filled 2024-06-12: qty 1

## 2024-06-12 MED ORDER — ONDANSETRON 4 MG PO TBDP: 4.0000 mg | ORAL_TABLET | Freq: Four times a day (QID) | ORAL | 0 refills | Status: AC | PRN

## 2024-06-12 MED ORDER — SODIUM CHLORIDE 0.9 % IV BOLUS (SEPSIS)
500.0000 mL | Freq: Once | INTRAVENOUS | Status: AC
  Administered 2024-06-12: 500 mL via INTRAVENOUS

## 2024-06-12 NOTE — ED Provider Notes (Signed)
 "  [facility] Provider Note    Event Date/Time   First MD Initiated Contact with Patient 06/12/24 0017     (approximate)   History   Hypertension   HPI  Kathy Long is a 74 y.o. female with history of chronic kidney disease, hyperlipidemia, atrial fibrillation on Eliquis  who presents to the emergency department with vertigo that started on Saturday.  Room is spinning with nausea.  No vomiting.  Symptoms are intermittent in nature.  Has tried Epley maneuver at home and meclizine  with some relief.  No prior history of stroke.  No head injury or headache.  States she checked her blood pressure at home and it was elevated and talk to her doctor who recommend going to the walk-in clinic.  Walk-in clinic recommended she come to the ED.  She denies any numbness, tingling, weakness, vision changes, chest pain or shortness of breath.  She is on metoprolol , amlodipine  which she reports compliance with for her blood pressure.  She is due for a dose of amlodipine  tonight.    History provided by patient and husband.    Past Medical History:  Diagnosis Date   Adiposity 09/28/2014   Body mass index is 31.45 kg/(m^2).     Last Assessment & Plan:   I have addressed  BMI and recommended wt loss of 10% of body weight over the next 6 months using a low glycemic index diet and regular exercise a minimum of 5 days per week.     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 Has been having foot pain since May 2022.     CKD (chronic kidney disease) stage 3, GFR 30-59 ml/min 04/13/2018   Nephrology referral recommended.       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 I have now received a night guard that is working very well!   Squamous cell  carcinoma of skin of face 10/21/2011   Overview:   SCC of the right malar cheek treated with Mohs surgery 10/21/11     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     Past Surgical History:  Procedure Laterality Date   ANKLE FRACTURE SURGERY Left    APPENDECTOMY  08/24/2007   Led to incisional hernia which has been corrected   ATRIAL FIBRILLATION ABLATION N/A 09/09/2022   Procedure: ATRIAL FIBRILLATION ABLATION;  Surgeon: Cindie Ole DASEN, MD;  Location: [facility];  Service: Cardiovascular;  Laterality: N/A;   BACK SURGERY      disectomy lumbar, scar tissue   BREAST CYST EXCISION Right 1970   axilla   buninectomy Bilateral    CATARACT EXTRACTION W/PHACO Left 04/12/2018   Procedure: CATARACT EXTRACTION PHACO AND INTRAOCULAR LENS PLACEMENT (IOC)  LEFT TORIC LENS;  Surgeon: Mittie Gaskin, MD;  Location: [facility];  Service: Ophthalmology;  Laterality: Left;   CATARACT EXTRACTION W/PHACO Right 05/10/2018   Procedure: CATARACT EXTRACTION PHACO AND INTRAOCULAR LENS PLACEMENT (IOC)  RIGHT TORIC LENS;  Surgeon: Mittie Gaskin, MD;  Location: [facility];  Service: Ophthalmology;  Laterality: Right;  DIABETIC   CHOLECYSTECTOMY N/A 07/05/2017   Procedure: LAPAROSCOPIC CHOLECYSTECTOMY WITH INTRAOPERATIVE CHOLANGIOGRAM;  Surgeon: Dessa Reyes ORN, MD;  Location: [facility] ORS;  Service: General;  Laterality: N/A;   COLONOSCOPY     2009 and color gaurd in 2018   COLONOSCOPY N/A 03/25/2020   Procedure: COLONOSCOPY;  Surgeon: Maryruth Ole DASEN, MD;  Location: [facility] END[facility];  Service: Endoscopy;  Laterality: N/A;   DRUG INDUCED ENDOSCOPY N/A 05/06/2021   Procedure: DRUG INDUCED SLEEP ENDOSCOPY;  Surgeon: Carlie Clark, MD;  Location: [facility];  Service: ENT;  Laterality: N/A;   ERCP N/A 07/12/2017   Procedure: ENDOSCOPIC RETROGRADE CHOLANGIOPANCREATOGRAPHY (ERCP);  Surgeon: Jinny Carmine, MD;  Location: [facility];  Service: Endoscopy;  Laterality: N/A;   ERCP N/A 10/04/2017   Procedure: ENDOSCOPIC RETROGRADE CHOLANGIOPANCREATOGRAPHY (ERCP);  Surgeon: Jinny Carmine, MD;  Location: [facility] END[facility]COPY;  Service: Endoscopy;  Laterality: N/A;   EYE SURGERY  2019   Cataract surgery both eyes   FOOT SURGERY     x 2   FRACTURE SURGERY  November 30, 2017   Displaced ankle from fall   HERNIA REPAIR  2022   INCISIONAL HERNIA REPAIR N/A 12/04/2020   Procedure: VENTRAL INCISIONAL HERNIA REPAIR WITH MESH;  Surgeon: Eletha Boas, MD;  Location: [facility];  Service: General;  Laterality: N/A;   MELANOMA  EXCISION Left 2000   PALATOPLASTY N/A 04/08/2015   Procedure: PALATOPLASTY;  Surgeon: Chinita Hasten, MD;  Location: [facility] ORS;  Service: ENT;  Laterality: N/A;   SPINE SURGERY  2005 and 2011   Bulging disk and then scar tissue   TONSILLECTOMY N/A 04/08/2015   Procedure: TONSILLECTOMY;  Surgeon: Chinita Hasten, MD;  Location: [facility] ORS;  Service: ENT;  Laterality: N/A;   TUBAL LIGATION  1975   Birth control    MEDICATIONS:  Prior to Admission medications  Medication Sig Start Date End Date Taking? Authorizing Provider  ALPRAZolam  (XANAX ) 0.25 MG tablet Take 1 tablet (0.25 mg total) by mouth at bedtime. 03/02/24   Rhys Boyer T, PA-C  amLODipine  (NORVASC ) 2.5 MG tablet Take 1 tablet (2.5 mg total) by mouth 2 (two) times daily. 11/21/23   Gollan, Timothy J, MD  benzonatate  (TESSALON ) 100 MG capsule TAKE 2 CAPSULES BY MOUTH 3 TIMES DAILY AS NEEDED FOR COUGH. Patient not taking: Reported on 04/09/2024 09/09/23  Marylynn Verneita CROME, MD  Cholecalciferol  125 MCG (5000 UT) TABS Take 5,000 Units by mouth in the morning. Patient not taking: Reported on 04/09/2024    [provider]  citalopram  (CELEXA ) 40 MG tablet TAKE ONE TABLET BY MOUTH ONCE DAILY 05/26/24   Rhys Verneita T, PA-C  clobetasol  (TEMOVATE ) 0.05 % external solution APPLY DAILY UNTIL RASH IS CLEAR 03/02/22   Marylynn Verneita CROME, MD  Coenzyme Q10 100 MG capsule Take 100 mg by mouth daily. Patient not taking: Reported on 04/09/2024    [provider]  [employer] MAGNESIUM GLYCINATE PO Take by mouth.    [provider]  ELIQUIS  5 MG TABS tablet TAKE 1 TABLET BY MOUTH 2 TIMES DAILY. 04/19/24   Cindie Ole DASEN, MD  Famotidine (ACID CONTROLLER PO) Take by mouth daily.    [provider]  fluticasone (FLONASE) 50 MCG/ACT nasal spray Place into both nostrils daily.    [provider]  metoprolol  succinate (TOPROL -XL) 25 MG 24 hr tablet Take 1 tablet (25 mg total) by mouth daily. Take with or immediately  following a meal. 11/21/23   Gollan, Timothy J, MD  ondansetron  (ZOFRAN ) 4 MG tablet TAKE ONE TABLET EVERY EIGHT HOURS IF NEEDED FOR NAUSEA AND VOMITING 03/13/24   Marylynn Verneita CROME, MD  ondansetron  (ZOFRAN -ODT) 4 MG disintegrating tablet Take 1 tablet (4 mg total) by mouth every 8 (eight) hours as needed for nausea or vomiting. 02/04/24   Corlis Burnard DEL, NP  rosuvastatin  (CRESTOR ) 10 MG tablet Take 1 tablet (10 mg total) by mouth daily. 07/28/23   Marylynn Verneita CROME, MD  tirzepatide  (ZEPBOUND ) 5 MG/0.5ML Pen INJECT 5MG  (1 PEN) UNDER THE SKIN EVERY WEEK 05/03/24   Marylynn Verneita CROME, MD    Physical Exam   Triage Vital Signs: ED Triage Vitals  Encounter Vitals Group     BP 06/11/24 2007 (!) 145/99     Girls Systolic BP Percentile --      Girls Diastolic BP Percentile --      Boys Systolic BP Percentile --      Boys Diastolic BP Percentile --      Pulse Rate 06/11/24 2007 64     Resp 06/11/24 2007 18     Temp 06/11/24 2007 98.3 F (36.8 C)     Temp Source 06/11/24 2007 Oral     SpO2 06/11/24 2007 100 %     Weight 06/11/24 2006 154 lb 0.6 oz (69.9 kg)     Height 06/11/24 2006 5' 2 (1.575 m)     Head Circumference --      Peak Flow --      Pain Score 06/11/24 2008 0     Pain Loc --      Pain Education --      Exclude from Growth Chart --     Most recent vital signs: Vitals:   06/12/24 0102 06/12/24 0248  BP: (!) 186/103 (!) 168/89  Pulse: (!) 55 65  Resp:    Temp:    SpO2: 96%     CONSTITUTIONAL: Alert, responds appropriately to questions. Well-appearing; well-nourished HEAD: Normocephalic, atraumatic EYES: Conjunctivae clear, pupils appear equal, sclera nonicteric ENT: normal nose; moist mucous membranes NECK: Supple, normal ROM CARD: RRR; S1 and S2 appreciated RESP: Normal chest excursion without splinting or tachypnea; breath sounds clear and equal bilaterally; no wheezes, no rhonchi, no rales, no hypoxia or respiratory distress, speaking full sentences ABD/GI: Non-distended;  soft, non-tender, no rebound, no guarding, no peritoneal signs BACK:  The back appears normal EXT: Normal ROM in all joints; no deformity noted, no edema SKIN: Normal color for age and race; warm; no rash on exposed skin NEURO: Moves all extremities equally, normal speech, normal sensation diffusely, cranial nerves II through XII intact PSYCH: The patient's mood and manner are appropriate.   ED Results / Procedures / Treatments   LABS: (all labs ordered are listed, but only abnormal results are displayed) Labs Reviewed  COMPREHENSIVE METABOLIC PANEL WITH GFR - Abnormal; Notable for the following components:      Result Value   Glucose, Bld 106 (*)    Creatinine, Ser 1.21 (*)    GFR, Estimated 47 (*)    All other components within normal limits  URINALYSIS, ROUTINE W REFLEX MICROSCOPIC - Abnormal; Notable for the following components:   Color, Urine STRAW (*)    APPearance CLEAR (*)    Leukocytes,Ua TRACE (*)    Bacteria, UA RARE (*)    All other components within normal limits  URINE CULTURE  CBC WITH DIFFERENTIAL/PLATELET     EKG:  EKG Interpretation Date/Time:  Tuesday June 12 2024 02:42:52 EST Ventricular Rate:  57 PR Interval:  170 QRS Duration:  74 QT Interval:  408 QTC Calculation: 397 R Axis:   -2  Text Interpretation: Sinus bradycardia Low voltage QRS Cannot rule out Anteroseptal infarct (cited on or before 12-Jun-2024) ST & T wave abnormality, consider inferior ischemia Abnormal ECG When compared with ECG of 14-Mar-2024 11:25, Previous ECG has undetermined rhythm, needs review Criteria for Inferior infarct are no longer Present Serial changes of Anteroseptal infarct Present Confirmed by Neomi Neptune [phone]) on 06/12/2024 2:45:42 AM         RADIOLOGY: My personal review and interpretation of imaging: MRI brain shows no acute stroke.  I have personally reviewed all radiology reports.   MR BRAIN WO CONTRAST Result Date: 06/12/2024 CLINICAL DATA:  Initial  evaluation for acute vertigo. EXAM: MRI HEAD WITHOUT CONTRAST TECHNIQUE: Multiplanar, multiecho pulse sequences of the brain and surrounding structures were obtained without intravenous contrast. COMPARISON:  Prior study from 11/29/2023. FINDINGS: Brain: Cerebral volume within normal limits. Patchy T2/FLAIR hyperintensity involving the supratentorial cerebral white matter, consistent with chronic small vessel ischemic disease, moderately advanced in nature. No evidence for acute or subacute infarct. Gray-white matter differentiation maintained. No areas of chronic cortical infarction. No acute or significant chronic intracranial blood products. Patient's known left vestibular schwannoma noted, grossly stable on this noncontrast examination. No other mass lesion, mass effect, or midline shift. No hydrocephalus or extra-axial fluid collection. Pituitary gland within normal limits. Vascular: Major intracranial vascular flow voids are maintained. Skull and upper cervical spine: Cranial junction within normal limits. Bone marrow signal intensity within normal limits. No scalp soft tissue abnormality. Sinuses/Orbits: Prior bilateral ocular lens replacement. Paranasal sinuses are largely clear. No mastoid effusion. Other: None. IMPRESSION: 1. No acute intracranial abnormality. 2. Moderately advanced chronic microvascular ischemic disease. 3. Left vestibular schwannoma, grossly stable from prior on this noncontrast examination. Electronically Signed   By: Morene Hoard M.D.   On: 06/12/2024 02:28     PROCEDURES:  Critical Care performed: No      Procedures    IMPRESSION / MDM / ASSESSMENT AND PLAN / ED COURSE  I reviewed the triage vital signs and the nursing notes.    Patient here with elevated blood pressure, vertigo.  The patient is on the cardiac monitor to evaluate for evidence of arrhythmia and/or significant heart rate changes.   DIFFERENTIAL  DIAGNOSIS (includes but not limited to):    Hypertension, hypertensive urgency, hypertensive emergency, CVA, peripheral vertigo, anemia, electrolyte derangement, UTI, dehydration, intracranial hemorrhage   Patient's presentation is most consistent with acute presentation with potential threat to life or bodily function.   PLAN: Will obtain labs, urine, EKG, MRI of the brain without contrast.  Will give IV fluids, Zofran , meclizine  for symptomatic relief.  Will give her her home doses of blood pressure medication and Eliquis  which are due now.   MEDICATIONS GIVEN IN ED: Medications  sodium chloride  0.9 % bolus 500 mL (0 mLs Intravenous Stopped 06/12/24 0112)  meclizine  (ANTIVERT ) tablet 25 mg (25 mg Oral Given 06/12/24 0035)  ondansetron  (ZOFRAN ) injection 4 mg (4 mg Intravenous Given 06/12/24 0036)  amLODipine  (NORVASC ) tablet 2.5 mg (2.5 mg Oral Given 06/12/24 0035)  apixaban  (ELIQUIS ) tablet 5 mg (5 mg Oral Given 06/12/24 0106)  amLODipine  (NORVASC ) tablet 2.5 mg (2.5 mg Oral Given 06/12/24 0248)  metoprolol  tartrate (LOPRESSOR ) tablet 25 mg (25 mg Oral Given 06/12/24 0248)     ED COURSE: MRI brain reviewed and interpreted by myself and radiologist and shows no acute abnormality.  Stable chronic kidney disease.  Urine shows mild pyuria and bacteria but she is not symptomatic.  Will send urine culture.  No proteinuria or hematuria.  No signs of endorgan damage today.  Blood pressure currently in the 160s/80s.  She is currently feeling well, ambulating without difficulty.  Will discharge with prescriptions for meclizine , Zofran  and recommended close follow-up with her PCP for management of her blood pressure.  At this time, I do not feel there is any life-threatening condition present. I reviewed all nursing notes, vitals, pertinent previous records.  All lab and urine results, EKGs, imaging ordered have been independently reviewed and interpreted by myself.  I reviewed all available radiology reports from any imaging ordered this  visit.  Based on my assessment, I feel the patient is safe to be discharged home without further emergent workup and can continue workup as an outpatient as needed. Discussed all findings, treatment plan as well as usual and customary return precautions.  They verbalize understanding and are comfortable with this plan.  Outpatient follow-up has been provided as needed.  All questions have been answered.    CONSULTS:  none   OUTSIDE RECORDS REVIEWED: Reviewed recent family medicine and cardiology notes.       FINAL CLINICAL IMPRESSION(S) / ED DIAGNOSES   Final diagnoses:  Vertigo  Hypertension, unspecified type     Rx / DC Orders   ED Discharge Orders          Ordered    meclizine  (ANTIVERT ) 25 MG tablet  3 times daily PRN        06/12/24 0231    ondansetron  (ZOFRAN -ODT) 4 MG disintegrating tablet  Every 6 hours PRN        06/12/24 0231             Note:  This document was prepared using Dragon voice recognition software and may include unintentional dictation errors.   Massimo Hartland, Josette SAILOR, DO 06/12/24 9745  "

## 2024-06-12 NOTE — ED Notes (Signed)
 Pt in MRI.

## 2024-06-13 LAB — URINE CULTURE

## 2024-06-14 ENCOUNTER — Ambulatory Visit: Admitting: Psychiatry

## 2024-06-21 ENCOUNTER — Ambulatory Visit: Admitting: Psychiatry

## 2024-06-21 DIAGNOSIS — F411 Generalized anxiety disorder: Secondary | ICD-10-CM

## 2024-06-21 NOTE — Progress Notes (Signed)
 "       [facility] Counselor/Therapist Progress Note  Patient ID: Kathy Long, MRN: [id number],    Date: 06/21/2024  Time Spent: 55 minutes   Treatment Type: Individual Therapy  Reported Symptoms:   anxiety, some depression  Mental Status Exam:  Appearance:   Neat and Well Groomed     Behavior:  Appropriate, Sharing, and Motivated  Motor:  Normal  Speech/Language:   Clear and Coherent  Affect:  Depressed and anxious  Mood:  anxious and depressed  Thought process:  goal directed  Thought content:    Rumination  Sensory/Perceptual disturbances:    WNL  Orientation:  oriented to person, place, time/date, situation, day of week, month of year, year, and stated date of Jan. 22. 2026  Attention:  Good  Concentration:  Good  Memory:  WNL  Fund of knowledge:   Good  Insight:    Good  Judgment:   Good  Impulse Control:  Good   Risk Assessment: Danger to Self:  No Self-injurious Behavior: No Danger to Others: No Duty to Warn:no Physical Aggression / Violence:No  Access to Firearms a concern: No  Gang Involvement:No   Subjective:  Patient today reporting anxiety and some depression related to family and the world and all the chaos and crazy things going on, which she shared more and processed in session today, which she stated did help her. Also encouraged her to limit her time of watching so much political news. Patient needing time today to also discuss another worry I'm having about my husband. Shared some information re: her concerns about husband, explaining behaviors in more detail. (Not all details included in this note due to patient privacy needs.)  Husband having what patient feels may be some symptoms of depression, memory issues, and possibly some OCD. Discussed patient's own needs and also how she can encourage her husband to be seen for his depression, memory issues, and OCD.  Has been has ask her about him possibly getting an appointment to be seen and has several  options where he can go for help.  Patient reported feeling more grounded today at end of session as she has had a lot of this that she is To herself, that she was able to share today both regarding herself personally but also her increasing concerns about her husband.  Interventions: Cognitive Behavioral Therapy, Solution-Oriented/Positive Psychology, and Ego-Supportive 1.  Reduce overall level, frequency, and intensity of the anxiety so that daily functioning is not impaired. 2.  Increased understanding of beliefs and messages that produce worry and anxiety for her. 3.  Explore cognitive messages that mediate anxiety response and retrain and adaptive cognitions.   Diagnosis:   ICD-10-CM   1. Generalized anxiety disorder  F41.1       Plan:  Patient very actively involved in session today sharing openly some concerns she has herself and also some concerns about her husband's mental health that seems to be declining some.  Patient is going to talk with husband and they can follow-up in getting help for him.  Patient herself agrees to stay on her medication as prescribed and put into practice some of the strategies we discussed today particularly in better managing when things feel very stressful and out of her control, and also limiting her watching of TV and political news that tends to create more worry and anxiety for her.  Stated that there are other programs that she enjoys and will definitely be more intentional and watching those  that are more entertaining or funny versus TV and political news that can be upsetting.  Patient very actively involved in session today and doing some good work on trying to set some limits for herself, and consider some improved coping skills going forward, focusing more on her positives and strengths versus what she may see as her negatives.  Did seem less anxious at end of session and motivated to follow through on treatment goals.  Goal review and  progress/challenges discussed with patient.  Next appt within 2 wks.   Barnie Bunde, LCSW                   "

## 2024-06-22 ENCOUNTER — Other Ambulatory Visit: Payer: Self-pay | Admitting: Pulmonary Disease

## 2024-06-22 DIAGNOSIS — G4733 Obstructive sleep apnea (adult) (pediatric): Secondary | ICD-10-CM

## 2024-06-22 DIAGNOSIS — J849 Interstitial pulmonary disease, unspecified: Secondary | ICD-10-CM

## 2024-06-27 ENCOUNTER — Telehealth: Payer: Self-pay | Admitting: Psychiatry

## 2024-06-27 NOTE — Telephone Encounter (Signed)
 Last appt is 07/09/24. Debbie called to see if she can leave a confidential message on Debbie's voicemail or specific mailbox if she has one.   Her number is [phone].  Let me know if you don't understand the message.

## 2024-06-28 ENCOUNTER — Encounter: Payer: Self-pay | Admitting: Internal Medicine

## 2024-06-28 ENCOUNTER — Ambulatory Visit: Admitting: Internal Medicine

## 2024-06-28 ENCOUNTER — Telehealth: Payer: Self-pay | Admitting: Internal Medicine

## 2024-06-28 VITALS — BP 102/70 | HR 58 | Temp 97.5°F | Ht 62.0 in | Wt 154.0 lb

## 2024-06-28 DIAGNOSIS — F331 Major depressive disorder, recurrent, moderate: Secondary | ICD-10-CM

## 2024-06-28 DIAGNOSIS — H8110 Benign paroxysmal vertigo, unspecified ear: Secondary | ICD-10-CM | POA: Diagnosis not present

## 2024-06-28 DIAGNOSIS — Z8639 Personal history of other endocrine, nutritional and metabolic disease: Secondary | ICD-10-CM | POA: Diagnosis not present

## 2024-06-28 DIAGNOSIS — I480 Paroxysmal atrial fibrillation: Secondary | ICD-10-CM | POA: Diagnosis not present

## 2024-06-28 DIAGNOSIS — I10 Essential (primary) hypertension: Secondary | ICD-10-CM | POA: Diagnosis not present

## 2024-06-28 DIAGNOSIS — I700 Atherosclerosis of aorta: Secondary | ICD-10-CM

## 2024-06-28 DIAGNOSIS — E785 Hyperlipidemia, unspecified: Secondary | ICD-10-CM | POA: Diagnosis not present

## 2024-06-28 DIAGNOSIS — F411 Generalized anxiety disorder: Secondary | ICD-10-CM

## 2024-06-28 DIAGNOSIS — I679 Cerebrovascular disease, unspecified: Secondary | ICD-10-CM | POA: Diagnosis not present

## 2024-06-28 DIAGNOSIS — G4733 Obstructive sleep apnea (adult) (pediatric): Secondary | ICD-10-CM

## 2024-06-28 DIAGNOSIS — N1831 Chronic kidney disease, stage 3a: Secondary | ICD-10-CM

## 2024-06-28 DIAGNOSIS — D333 Benign neoplasm of cranial nerves: Secondary | ICD-10-CM | POA: Diagnosis not present

## 2024-06-28 LAB — LIPID PANEL
Cholesterol: 144 mg/dL (ref 28–200)
HDL: 44.5 mg/dL
LDL Cholesterol: 84 mg/dL (ref 10–99)
NonHDL: 99.74
Total CHOL/HDL Ratio: 3
Triglycerides: 80 mg/dL (ref 10.0–149.0)
VLDL: 16 mg/dL (ref 0.0–40.0)

## 2024-06-28 LAB — LDL CHOLESTEROL, DIRECT: Direct LDL: 85 mg/dL

## 2024-06-28 MED ORDER — PREDNISONE 10 MG PO TABS: ORAL_TABLET | ORAL | 0 refills | Status: AC

## 2024-06-28 MED ORDER — DIAZEPAM 5 MG PO TABS: 5.0000 mg | ORAL_TABLET | Freq: Two times a day (BID) | ORAL | 0 refills | Status: AC | PRN

## 2024-06-28 NOTE — Telephone Encounter (Signed)
 Noted.

## 2024-06-28 NOTE — Telephone Encounter (Signed)
 Non- opioid substance agreement has been scanned into the chart under release of information.

## 2024-06-28 NOTE — Patient Instructions (Addendum)
 CONTINUE FLONASE DAILY . ADD ASTELIN NASAL SPRAY DAILY  AS WELL (otc) for the post nasal drip and congestion   You may use benefiber daily   and continue magnesium at night  for the constipation   Start  THE PREDNISONE  TAPER FOR THE NEXT DOSE OF VERTIGO TO SEE IF IT HELPS (WILL HELP INFLAMMATION OF EESTACHIAN TUBES). I have also prescribed a low dose of valium  (diazepam ) to tyr during acute episodes  Goal LDL is 70 given the chronic changes seen on your MRI

## 2024-06-29 DIAGNOSIS — H8110 Benign paroxysmal vertigo, unspecified ear: Secondary | ICD-10-CM | POA: Insufficient documentation

## 2024-06-29 DIAGNOSIS — I679 Cerebrovascular disease, unspecified: Secondary | ICD-10-CM | POA: Insufficient documentation

## 2024-06-29 DIAGNOSIS — F331 Major depressive disorder, recurrent, moderate: Secondary | ICD-10-CM | POA: Insufficient documentation

## 2024-06-29 NOTE — Assessment & Plan Note (Signed)
 Recent episode treated in ER with MRI brain and IV meds/. Episodes are infrequent., andmost recent episode may have been triggered by  viral URI .  Prednisone  taper and diazepam  rx for next episode

## 2024-06-29 NOTE — Assessment & Plan Note (Signed)
 Symptoms are well controlled on current dose of citalopram . No changes today

## 2024-06-29 NOTE — Assessment & Plan Note (Addendum)
 She has had No recurrence since catheter ablation in April 2024.  Continue metoprolol  and Eliquis 

## 2024-06-29 NOTE — Assessment & Plan Note (Signed)
 Continue anticoagulation , BP control and hyperlipidemia

## 2024-06-29 NOTE — Assessment & Plan Note (Signed)
 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   Stable by last CT done in ER during evaluation for vertigo

## 2024-06-29 NOTE — Assessment & Plan Note (Signed)
 Stable by repeat labs  she has declined nephrology follow up and is avoiding NSAIDs  Lab Results  Component Value Date   CREATININE 1.21 (H) 06/11/2024   Lab Results  Component Value Date   NA 139 06/11/2024   K 4.8 06/11/2024   CL 103 06/11/2024   CO2 26 06/11/2024

## 2024-06-30 ENCOUNTER — Ambulatory Visit: Payer: Self-pay | Admitting: Internal Medicine

## 2024-06-30 MED ORDER — ROSUVASTATIN CALCIUM 20 MG PO TABS: 20.0000 mg | ORAL_TABLET | Freq: Every day | ORAL | 2 refills | Status: AC

## 2024-06-30 NOTE — Assessment & Plan Note (Addendum)
 Recent elevations have been triggered by anxiety and frequent BP checks.  .  OSA is controlled with oral device per communication with her dentist Adam).  Continue  regimen of amlodipine  2.5 mg bid and metoprolol  succinate 25 mg daily.   She has no proteinuria by Feb 2025 evaluation but will consider  adding ARB   Lab Results  Component Value Date   MICROALBUR 1.0 07/27/2023   MICROALBUR 1.3 10/09/2021

## 2024-06-30 NOTE — Assessment & Plan Note (Addendum)
 Managed with mouth guard per patient preference.

## 2024-06-30 NOTE — Assessment & Plan Note (Signed)
 Did not tolerate change in citalopram  to sertraline  ; continue citalopram  40 mg daily and limite use of alprazolam  to a dose of  .25 mg prn at bedtime

## 2024-07-05 ENCOUNTER — Ambulatory Visit: Admitting: Psychiatry

## 2024-07-05 ENCOUNTER — Encounter: Payer: Self-pay | Admitting: Internal Medicine

## 2024-07-06 ENCOUNTER — Ambulatory Visit: Admission: RE | Admit: 2024-07-06

## 2024-07-06 DIAGNOSIS — J849 Interstitial pulmonary disease, unspecified: Secondary | ICD-10-CM

## 2024-07-06 DIAGNOSIS — G4733 Obstructive sleep apnea (adult) (pediatric): Secondary | ICD-10-CM

## 2024-07-09 ENCOUNTER — Ambulatory Visit: Admitting: Psychiatry

## 2024-08-01 ENCOUNTER — Ambulatory Visit: Admitting: Psychiatry

## 2024-08-20 ENCOUNTER — Ambulatory Visit: Admitting: Physician Assistant

## 2024-12-26 ENCOUNTER — Ambulatory Visit: Admitting: Internal Medicine
